# Patient Record
Sex: Female | Born: 1940 | Race: White | Hispanic: No | Smoking: Former smoker
Health system: Southern US, Community
[De-identification: ages and names within clinical notes are randomized; demographics above are authoritative.]

## PROBLEM LIST (undated history)

## (undated) DIAGNOSIS — G47411 Narcolepsy with cataplexy: Secondary | ICD-10-CM

## (undated) DIAGNOSIS — I050 Rheumatic mitral stenosis: Secondary | ICD-10-CM

## (undated) DIAGNOSIS — I10 Essential (primary) hypertension: Secondary | ICD-10-CM

## (undated) DIAGNOSIS — I5030 Unspecified diastolic (congestive) heart failure: Secondary | ICD-10-CM

## (undated) DIAGNOSIS — K449 Diaphragmatic hernia without obstruction or gangrene: Secondary | ICD-10-CM

## (undated) DIAGNOSIS — N289 Disorder of kidney and ureter, unspecified: Secondary | ICD-10-CM

## (undated) DIAGNOSIS — Z86718 Personal history of other venous thrombosis and embolism: Secondary | ICD-10-CM

## (undated) DIAGNOSIS — K219 Gastro-esophageal reflux disease without esophagitis: Secondary | ICD-10-CM

## (undated) DIAGNOSIS — E119 Type 2 diabetes mellitus without complications: Secondary | ICD-10-CM

## (undated) DIAGNOSIS — G629 Polyneuropathy, unspecified: Secondary | ICD-10-CM

## (undated) DIAGNOSIS — I350 Nonrheumatic aortic (valve) stenosis: Secondary | ICD-10-CM

## (undated) HISTORY — DX: Narcolepsy with cataplexy: G47.411

## (undated) HISTORY — DX: Gastro-esophageal reflux disease without esophagitis: K21.9

## (undated) HISTORY — PX: CHOLECYSTECTOMY: SHX55

## (undated) HISTORY — PX: CARPAL TUNNEL RELEASE: SHX101

## (undated) HISTORY — PX: HEMORROIDECTOMY: SUR656

## (undated) HISTORY — DX: Diaphragmatic hernia without obstruction or gangrene: K44.9

## (undated) HISTORY — PX: ABDOMINAL HYSTERECTOMY: SHX81

## (undated) HISTORY — PX: TOTAL HIP ARTHROPLASTY: SHX124

## (undated) HISTORY — PX: KIDNEY SURGERY: SHX687

## (undated) HISTORY — PX: NEPHROSTOMY: SHX1014

---

## 1999-03-08 ENCOUNTER — Encounter: Payer: Self-pay | Admitting: Orthopedic Surgery

## 1999-03-08 ENCOUNTER — Ambulatory Visit (HOSPITAL_COMMUNITY): Admission: RE | Admit: 1999-03-08 | Discharge: 1999-03-08 | Payer: Self-pay | Admitting: Orthopedic Surgery

## 1999-03-23 ENCOUNTER — Ambulatory Visit (HOSPITAL_COMMUNITY): Admission: RE | Admit: 1999-03-23 | Discharge: 1999-03-23 | Payer: Self-pay | Admitting: Orthopedic Surgery

## 1999-03-23 ENCOUNTER — Encounter: Payer: Self-pay | Admitting: Orthopedic Surgery

## 1999-04-06 ENCOUNTER — Ambulatory Visit (HOSPITAL_COMMUNITY): Admission: RE | Admit: 1999-04-06 | Discharge: 1999-04-06 | Payer: Self-pay | Admitting: Orthopedic Surgery

## 1999-04-06 ENCOUNTER — Encounter: Payer: Self-pay | Admitting: Orthopedic Surgery

## 2000-05-07 ENCOUNTER — Encounter: Payer: Self-pay | Admitting: Orthopedic Surgery

## 2000-05-12 ENCOUNTER — Inpatient Hospital Stay (HOSPITAL_COMMUNITY): Admission: RE | Admit: 2000-05-12 | Discharge: 2000-05-16 | Payer: Self-pay | Admitting: Orthopedic Surgery

## 2000-05-12 ENCOUNTER — Encounter: Payer: Self-pay | Admitting: Orthopedic Surgery

## 2000-05-16 ENCOUNTER — Inpatient Hospital Stay (HOSPITAL_COMMUNITY)
Admission: RE | Admit: 2000-05-16 | Discharge: 2000-05-24 | Payer: Self-pay | Admitting: Physical Medicine & Rehabilitation

## 2000-06-20 ENCOUNTER — Ambulatory Visit (HOSPITAL_COMMUNITY): Admission: RE | Admit: 2000-06-20 | Discharge: 2000-06-20 | Payer: Self-pay | Admitting: Internal Medicine

## 2000-10-22 ENCOUNTER — Other Ambulatory Visit: Admission: RE | Admit: 2000-10-22 | Discharge: 2000-10-22 | Payer: Self-pay | Admitting: Otolaryngology

## 2000-10-24 ENCOUNTER — Ambulatory Visit (HOSPITAL_COMMUNITY): Admission: RE | Admit: 2000-10-24 | Discharge: 2000-10-24 | Payer: Self-pay | Admitting: Internal Medicine

## 2000-10-24 ENCOUNTER — Encounter: Payer: Self-pay | Admitting: Internal Medicine

## 2000-11-14 ENCOUNTER — Ambulatory Visit (HOSPITAL_COMMUNITY): Admission: RE | Admit: 2000-11-14 | Discharge: 2000-11-14 | Payer: Self-pay | Admitting: Internal Medicine

## 2002-11-02 ENCOUNTER — Encounter: Payer: Self-pay | Admitting: Internal Medicine

## 2002-11-02 ENCOUNTER — Ambulatory Visit (HOSPITAL_COMMUNITY): Admission: RE | Admit: 2002-11-02 | Discharge: 2002-11-02 | Payer: Self-pay | Admitting: Internal Medicine

## 2003-01-31 ENCOUNTER — Ambulatory Visit (HOSPITAL_COMMUNITY): Admission: RE | Admit: 2003-01-31 | Discharge: 2003-01-31 | Payer: Self-pay | Admitting: Internal Medicine

## 2003-08-04 ENCOUNTER — Ambulatory Visit (HOSPITAL_COMMUNITY): Admission: RE | Admit: 2003-08-04 | Discharge: 2003-08-04 | Payer: Self-pay | Admitting: Family Medicine

## 2003-09-14 ENCOUNTER — Ambulatory Visit (HOSPITAL_COMMUNITY): Admission: RE | Admit: 2003-09-14 | Discharge: 2003-09-14 | Payer: Self-pay | Admitting: Orthopedic Surgery

## 2003-09-15 ENCOUNTER — Ambulatory Visit (HOSPITAL_BASED_OUTPATIENT_CLINIC_OR_DEPARTMENT_OTHER): Admission: RE | Admit: 2003-09-15 | Discharge: 2003-09-15 | Payer: Self-pay | Admitting: Orthopedic Surgery

## 2003-09-15 ENCOUNTER — Ambulatory Visit (HOSPITAL_COMMUNITY): Admission: RE | Admit: 2003-09-15 | Discharge: 2003-09-15 | Payer: Self-pay | Admitting: Orthopedic Surgery

## 2003-12-23 ENCOUNTER — Ambulatory Visit (HOSPITAL_BASED_OUTPATIENT_CLINIC_OR_DEPARTMENT_OTHER): Admission: RE | Admit: 2003-12-23 | Discharge: 2003-12-23 | Payer: Self-pay | Admitting: Orthopedic Surgery

## 2004-07-25 ENCOUNTER — Ambulatory Visit (HOSPITAL_COMMUNITY): Admission: RE | Admit: 2004-07-25 | Discharge: 2004-07-25 | Payer: Self-pay | Admitting: Family Medicine

## 2004-12-04 ENCOUNTER — Ambulatory Visit: Payer: Self-pay | Admitting: Internal Medicine

## 2005-03-14 ENCOUNTER — Encounter: Admission: RE | Admit: 2005-03-14 | Discharge: 2005-03-14 | Payer: Self-pay | Admitting: Specialist

## 2005-06-07 ENCOUNTER — Ambulatory Visit: Admission: RE | Admit: 2005-06-07 | Discharge: 2005-06-07 | Payer: Self-pay | Admitting: Orthopedic Surgery

## 2005-06-11 ENCOUNTER — Ambulatory Visit: Payer: Self-pay | Admitting: Internal Medicine

## 2005-06-20 ENCOUNTER — Ambulatory Visit (HOSPITAL_COMMUNITY): Admission: RE | Admit: 2005-06-20 | Discharge: 2005-06-20 | Payer: Self-pay | Admitting: Internal Medicine

## 2005-06-20 ENCOUNTER — Ambulatory Visit: Payer: Self-pay | Admitting: Internal Medicine

## 2005-06-20 ENCOUNTER — Encounter (INDEPENDENT_AMBULATORY_CARE_PROVIDER_SITE_OTHER): Payer: Self-pay | Admitting: Specialist

## 2005-06-21 ENCOUNTER — Ambulatory Visit (HOSPITAL_COMMUNITY): Admission: RE | Admit: 2005-06-21 | Discharge: 2005-06-21 | Payer: Self-pay | Admitting: Internal Medicine

## 2005-09-02 ENCOUNTER — Inpatient Hospital Stay (HOSPITAL_COMMUNITY): Admission: RE | Admit: 2005-09-02 | Discharge: 2005-09-08 | Payer: Self-pay | Admitting: Orthopedic Surgery

## 2005-09-03 ENCOUNTER — Ambulatory Visit: Payer: Self-pay | Admitting: Physical Medicine & Rehabilitation

## 2005-12-12 ENCOUNTER — Ambulatory Visit: Payer: Self-pay | Admitting: Internal Medicine

## 2005-12-14 ENCOUNTER — Emergency Department (HOSPITAL_COMMUNITY): Admission: EM | Admit: 2005-12-14 | Discharge: 2005-12-14 | Payer: Self-pay | Admitting: Emergency Medicine

## 2006-05-14 ENCOUNTER — Encounter: Payer: Self-pay | Admitting: Emergency Medicine

## 2006-05-14 ENCOUNTER — Ambulatory Visit: Payer: Self-pay | Admitting: Vascular Surgery

## 2006-05-14 ENCOUNTER — Inpatient Hospital Stay (HOSPITAL_COMMUNITY): Admission: AD | Admit: 2006-05-14 | Discharge: 2006-05-16 | Payer: Self-pay | Admitting: Internal Medicine

## 2006-05-14 ENCOUNTER — Ambulatory Visit: Payer: Self-pay | Admitting: Internal Medicine

## 2007-12-01 ENCOUNTER — Ambulatory Visit (HOSPITAL_COMMUNITY): Admission: RE | Admit: 2007-12-01 | Discharge: 2007-12-01 | Payer: Self-pay | Admitting: Family Medicine

## 2007-12-07 ENCOUNTER — Ambulatory Visit (HOSPITAL_COMMUNITY): Admission: RE | Admit: 2007-12-07 | Discharge: 2007-12-07 | Payer: Self-pay | Admitting: Family Medicine

## 2008-04-06 ENCOUNTER — Ambulatory Visit (HOSPITAL_COMMUNITY): Admission: RE | Admit: 2008-04-06 | Discharge: 2008-04-06 | Payer: Self-pay | Admitting: Family Medicine

## 2008-04-26 ENCOUNTER — Ambulatory Visit (HOSPITAL_COMMUNITY): Admission: RE | Admit: 2008-04-26 | Discharge: 2008-04-26 | Payer: Self-pay | Admitting: Family Medicine

## 2008-05-31 ENCOUNTER — Ambulatory Visit (HOSPITAL_COMMUNITY): Admission: RE | Admit: 2008-05-31 | Discharge: 2008-05-31 | Payer: Self-pay | Admitting: Family Medicine

## 2009-02-06 ENCOUNTER — Ambulatory Visit (HOSPITAL_COMMUNITY): Admission: RE | Admit: 2009-02-06 | Discharge: 2009-02-06 | Payer: Self-pay | Admitting: Family Medicine

## 2009-02-16 ENCOUNTER — Encounter: Admission: RE | Admit: 2009-02-16 | Discharge: 2009-02-16 | Payer: Self-pay | Admitting: Family Medicine

## 2009-02-20 ENCOUNTER — Encounter: Admission: RE | Admit: 2009-02-20 | Discharge: 2009-02-20 | Payer: Self-pay | Admitting: Family Medicine

## 2009-05-22 ENCOUNTER — Ambulatory Visit (HOSPITAL_COMMUNITY): Admission: RE | Admit: 2009-05-22 | Discharge: 2009-05-22 | Payer: Self-pay | Admitting: Otolaryngology

## 2009-09-07 ENCOUNTER — Emergency Department (HOSPITAL_COMMUNITY): Admission: EM | Admit: 2009-09-07 | Discharge: 2009-09-07 | Payer: Self-pay | Admitting: Emergency Medicine

## 2009-09-19 ENCOUNTER — Ambulatory Visit (HOSPITAL_COMMUNITY): Admission: RE | Admit: 2009-09-19 | Discharge: 2009-09-19 | Payer: Self-pay | Admitting: Family Medicine

## 2009-11-13 ENCOUNTER — Ambulatory Visit: Payer: Self-pay | Admitting: Internal Medicine

## 2009-11-17 ENCOUNTER — Ambulatory Visit: Payer: Self-pay | Admitting: Internal Medicine

## 2009-11-17 ENCOUNTER — Ambulatory Visit (HOSPITAL_COMMUNITY): Admission: RE | Admit: 2009-11-17 | Discharge: 2009-11-17 | Payer: Self-pay | Admitting: Internal Medicine

## 2010-01-09 ENCOUNTER — Ambulatory Visit: Payer: Self-pay | Admitting: Internal Medicine

## 2010-01-15 ENCOUNTER — Encounter (HOSPITAL_COMMUNITY)
Admission: RE | Admit: 2010-01-15 | Discharge: 2010-02-14 | Payer: Self-pay | Source: Home / Self Care | Attending: Internal Medicine | Admitting: Internal Medicine

## 2010-02-19 ENCOUNTER — Encounter
Admission: RE | Admit: 2010-02-19 | Discharge: 2010-02-19 | Payer: Self-pay | Source: Home / Self Care | Attending: Obstetrics & Gynecology | Admitting: Obstetrics & Gynecology

## 2010-02-25 ENCOUNTER — Encounter: Payer: Self-pay | Admitting: Family Medicine

## 2010-04-18 LAB — GLUCOSE, CAPILLARY: Glucose-Capillary: 258 mg/dL — ABNORMAL HIGH (ref 70–99)

## 2010-06-22 NOTE — Op Note (Signed)
NAME:  Rebecca [name], LANGSTON                           ACCOUNT NO.:  [id number]   MEDICAL RECORD NO.:  [id number]                   PATIENT TYPE:  AMB   LOCATION:  DSC                                  FACILITY:  [facility]   PHYSICIAN:  Katy Fitch. Naaman Plummer., M.D.          DATE OF BIRTH:  15-Apr-1940   DATE OF PROCEDURE:  09/15/2003  DATE OF DISCHARGE:                                 OPERATIVE REPORT   PREOPERATIVE DIAGNOSIS:  1. 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  2. Painful volar ganglion cyst at bifurcation of radial artery right volar     wrist.   POSTOPERATIVE DIAGNOSIS:  1. 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  2. Painful volar ganglion cyst at bifurcation of radial artery right volar     wrist.   OPERATION:  1. Release of right transverse carpal ligament.  2. Excision of volar capsular ganglion adherent to superficial branch of     radial artery.   SURGEON:  Katy Fitch. Sypher, M.D.   ASSISTANT:  Marveen Reeks Dasnoit, P.A.-C.   ANESTHESIA:  General/sedation/IV regional at the proximal brachial level.   ANESTHESIOLOGIST:  Belva Agee, M.D.   INDICATIONS FOR PROCEDURE:  [name] is a 70 year old woman with multiple  medical problems including chronic diabetes, peripheral neuropathy,  narcolepsy, cataplexy, and painful carpal tunnel syndrome.  She was referred  by her family physician in [place], [place], for evaluation and  management of her hand pain predicament and had had neurologic evaluation at  [facility] confirming the presence of polyneuropathy with superimposed  carpal tunnel syndrome.  Due to a failure to respond to nonoperative  measures, she is brought to the operating room at this time.   PROCEDURE:  [name] is brought to the operating room and placed on  supine position on the operating table.  Following light sedation, an IV  regional block was placed with a thigh cuff at proximal  brachial level due  to morbid obesity.  After approximately ten minutes, we had satisfactory  anesthesia to allow proceeding with surgery.  A short transverse incision  was fashioned directly over the palpable mass at the bifurcation of the  radial artery.  The subcutaneous tissues were carefully divided revealing a  cystic mass adherent to the superficial branch of the radial artery  involving one of the veins passing with the artery.  This was  circumferentially dissected and followed down to the level of the volar  wrist capsule.  A portion of the vein accompanying the superficial branch of  the radial artery was sacrificed with the mass with 4-0 Vicryl suture being  used for hemostasis of the proximal and distal stumps of the vessel.  The  cyst was excised directly from the capsule and electrocautery used to seal  the margins of the ganglion neck.  The wound was irrigated and repaired with  intradermal 3-0 Prolene suture.  Attention was directed to the palm.  A short incision was fashioned in the  line of the ring finger.  The subcutaneous tissues were carefully divided  revealing the palmar fascia.  This was split longitudinally revealing the  common sensory branches of the median nerve.  These were followed back to  the transverse carpal ligament which was carefully isolated from the median  nerve.  The ligament was released along the ulnar border extending into the  distal forearm.  This widely opened the carpal canal.  No masses or other  predicaments were noted.  Bleeding points along the margins of the released  ligament were electrocauterized with bipolar current followed by repair of  the skin with intradermal 3-0 Prolene suture.  A compressive dressing was  applied with a volar plaster splint with the wrist in 5 degrees  dorsiflexion.                                               Katy Fitch Naaman Plummer., M.D.    RVS/MEDQ  D:  09/15/2003  T:  09/15/2003  Job:  [id number]

## 2010-06-22 NOTE — H&P (Signed)
NAMEOLIVIAH, AGOSTINI                 ACCOUNT NO.:  [id number]   MEDICAL RECORD NO.:  [id number]          PATIENT TYPE:  INP   LOCATION:  NA                           FACILITY:  [facility]   PHYSICIAN:  Ollen Gross, M.D.    DATE OF BIRTH:  [birth year]   DATE OF ADMISSION:  06/10/2005  DATE OF DISCHARGE:                                HISTORY & PHYSICAL   DATE OF OFFICE VISIT AND HISTORY AND PHYSICAL:  May 21, 2005.   CHIEF COMPLAINT:  Left hip pain.   HISTORY OF PRESENT ILLNESS:  The patient is a 70 year old female who has  been seen and evaluated by Dr. Lequita Halt for ongoing left hip pain.  She was  initially seen by Dr. Valma Cava and then followed up with Dr. Lequita Halt.  She was found to have hip pain that has been ongoing for quite some time  now.  She has been seen by Dr. Lequita Halt and found to have arthritis which has  been quite debilitating and progressive in nature.  It is felt she has  reached a point where she could benefit from undergoing hip replacement.  Risks and benefits of the procedure have been discussed with the patient at  length and she elected to proceed with surgery.   ALLERGIES:  Multiple allergies to include:   1.  CODEINE.  2.  COMPAZINE.  3.  DEMEROL.  4.  METHYLATE MERCURY COMPOUNDS.  5.  MORPHINE.  6.  NSAIDs.  7.  PENICILLIN.  8.  THIMEROSAL.  9.  ADHESIVE TAPE.  10. She states she also reacts with PAPER TAPE.  11. Documented history to BLACK SUTURE.   THE PATIENT IS ABLE TO USE OPSITE FOR DRESSING.   CURRENT MEDICATIONS:  Glucophage, Avandia, Cymbalta, Remeron, Ambien,  Prilosec, Cardizem, Ultracet, Lotensin, and hydrochlorothiazide.   PAST MEDICAL HISTORY:  1.  Narcolepsy.  2.  Cataplexy.  3.  Gastroesophageal reflux disease.  4.  Hypertension.  5.  History of esophageal polyps.  6.  History of prolapse colon.  7.  Varicose veins.  8.  Migraines.  9.  Anemia.  10. History of hypokalemia.  11. History of constipation.  12. History of  postoperative nausea.  13. Spinal stenosis.   PAST SURGICAL HISTORY:  1.  Right total hip replacement.  2.  Cholecystectomy.  3.  Hemorrhoidectomy.  4.  Tubal ligation.  5.  Lumpectomy.  6.  Hysterectomy.  7.  Multiple kidney and bladder surgeries including nephrostomy.   SOCIAL HISTORY:  Married.  She provides full care for her husband who is a  quadriplegic.  Denies using tobacco products or alcohol products.  Has two  children.   FAMILY HISTORY:  Mother deceased age 2 with a history of stroke, COPD,  heart failure, heart attack, and hypertension.  Father deceased age 72 with  a history of heart attack, stroke, and Alzheimer's.  She has a brother  living, age 56, with a history of heart attack and back surgery, and also  polio and scleroderma.   REVIEW OF SYSTEMS:  GENERAL:  No fever, chills, night sweats.  NEURO:  She  does have a history of narcolepsy and cataplexy.  No seizures or syncope.  RESPIRATORY:  No shortness of breath, productive cough, or hemoptysis.  CARDIOVASCULAR:  No chest pain, angina, orthopnea.  GI:  No nausea,  vomiting, diarrhea, or constipation, although she does have a history of  esophageal polyps and a prolapsed colon, and a history of constipation.  Denies any complaints at this time.  GU:  No dysuria, hematuria, discharge.  MUSCULOSKELETAL:  Left hip.   PHYSICAL EXAMINATION:  VITAL SIGNS:  Pulse 68, respirations 16, blood  pressure 130/74.  GENERAL:  A 70 year old white female, well nourished, well developed.  She  is accompanied by her daughter.  Alert, oriented, cooperative.  Some mild  distress secondary to her hip pain, overweight.  The patient is quite  anxious at time of exam.  HEENT:  Normocephalic, atraumatic.  Pupils round and reactive.  Oropharynx  clear.  EOMs intact.  Full upper and lower denture plates noted.  NECK:  Supple.  CHEST:  Clear anterior posterior chest walls.  No rhonchi, rales, or  wheezing.  HEART:  Irregular rhythm.   S1 S2 noted with ectopic beats.  ABDOMEN:  Soft, nontender, round.  Bowel sounds present.  RECTAL/BREASTS/GENITALIA:  Not done.  Not pertinent to present illness.  EXTREMITIES:  The left hip moderate pain with motion.  Flexion up to 90  degrees.  Minimal internal rotation, 20 degrees of external rotation, 20  degrees of abduction.   IMPRESSION:  1.  Osteoarthritis, left hip.  2.  Narcolepsy.  3.  Cataplexy.  4.  Gastroesophageal reflux disease.  5.  History of esophageal polyps.  6.  Prolapsed colon.  7.  History of migraines.  8.  History of anemia.  9.  History of hypokalemia.  10. History of constipation.  11. History of postoperative nausea.  12. History of varicose veins.  13. Spinal stenosis.   PLAN:  The patient will be admitted to [facility] to undergo a  left total hip arthroplasty.  The surgery will be performed by Dr. Ollen Gross.      Alexzandrew L. Julien Girt, P.A.      Ollen Gross, M.D.  Electronically Signed    ALP/MEDQ  D:  06/09/2005  T:  06/09/2005  Job:  [id number]   cc:   Melvyn Novas, MD  Fax: [phone]   Hanley Hays. Josefine Class, M.D.  Fax: [phone]

## 2010-06-22 NOTE — H&P (Signed)
[facility]  Patient:    Rebecca [name], Rebecca [name]                          MRN: [id number] Adm. Date:  05/09/00 Attending:  Ollen Gross, M.D. Dictator:   Dorie Rank, P.A.C. CC:         Dr. Josefine Class in [place]  Dr. Daleen Squibb in [place]   History and Physical  DATE OF BIRTH:  1940-05-09  CHIEF COMPLAINT:  Right hip pain.  HISTORY OF PRESENT ILLNESS:  Rebecca [name] is a 70 year old female who has had significant hip pain over the past 2 to 3 years.  It is to a point that it is hurting her all the time now.  It is in the groin and the buttock.  It radiates down to the mid thigh medially.  She is at a stage now where she can barely walk.  She is having difficulty sleeping tonight.  She has had a fairly active day.  On physical exam, she has flexion of the hip to 70 degrees, and above that she develops significant pain.  She has no internal or external rotation.  She only has 20 degrees of abduction.  She has a markedly antalgic gait with a positive Trendelenburg.  Radiographs of the right hip reveal bone-on-bone changes with the femoral head starting to rot a little bit.  Due to her significant pain, as well as interference of her activities of daily living, it was felt that she would benefit from undergoing a right total hip arthroplasty.  The procedure, as well as the risks and benefits were discussed with the patient, and she was in agreement to proceed with the procedure.  She obtained a cardiac clearance from Dr. Daleen Squibb from [facility], and she was cleared for surgery.  PAST MEDICAL HISTORY: 1. Hypertension. 2. History of narcolepsy and cataplexy. 3. History of prolapsed colon. 4. She has varicose veins. 5. Spinal stenosis. 6. Migraine headaches. 7. Osteoarthritis to the right hip as described above.  PAST SURGICAL HISTORY: 1. Cholecystectomy 7 to 8 years ago. 2. Hysterectomy in 1984. 3. Hemorrhoidectomy 25 years ago. 4. Tubal ligation,  age 54. 5. Nonmalignant lumpectomy, age 52 to 35.  MEDICATIONS:  1. Nexium 40 mg one p.o. q.d.  2. Hydrochlorothiazide 50 mg one p.o. q.a.m.  3. Lotensin 10 mg one p.o. q.d.  4. Ambien 10 mg one p.o. q.h.s.  5. Potassium 10 mEq one p.o. q.d.  6. ______ 37.5 mg q.a.m.  7. ______ 25 mg one p.o. q.d.  8. Premarin 1.25 mg one p.o. q.d.  9. Librax 5 mg one p.o. q.i.d. 10. Cardizem 360 mg one p.o. q.a.m. 11. Phenergan 25 mg one p.o. p.r.n. nausea. 12. Vicodin one or two p.o. q.4-6h. p.r.n. pain.  ALLERGIES:  DEMEROL, skin rash, REGLAN, nausea, INNOZAR, anaphylaxis, COMPAZINE, PENICILLIN, anaphylaxis.  She also states that anything with THERMASOL PRESERVATIVE she is allergic to, sometimes used in anesthetic, eye drops, and ear drops.  TAPE ADHESIVE.  She states she is allergic to ELASTIC. She states she is allergic to BLACK SUTURE.  She states once she had a cholecystectomy and she had some boils develop as a result of an allergic reaction to the suture, and these had to be I&D.  SOCIAL HISTORY:  The patient is married, she takes care of a quadriplegic husband at home.  She has two grown children.  She hopes to go to rehab after surgery.  She denies any  alcohol or tobacco use.  FAMILY HISTORY:  Mother living age 23, history of strokes, cerebrovascular accidents, myocardial infarction, hypertension, and stomach problems.  Father deceased at age 11, history of myocardial infarction, cerebrovascular accident, Alzheimers.  She does have a brother who has had an myocardial infarction.  REVIEW OF SYSTEMS:  GENERAL:  No fever, chills, night sweats, or bleeding tendencies.  PULMONARY:  No shortness of breath, productive cough, or hemoptysis.  CNS:  No blurred or double vision, headaches, or paralysis.  CARDIOVASCULAR:  No chest pain, angina, or orthopnea.  GASTROINTESTINAL:  Occasional pyrosis, has a history of gastroesophageal reflux disease.  GASTROURINARY:  No dysuria, hematuria,  or discharge.  MUSCULOSKELETAL:  Right hip pain as described above.  PHYSICAL EXAMINATION:  GENERAL:  A 70 year old white female, obese, who appears somewhat nervous and apprehensive on exam.  VITAL SIGNS:  Pulse 86, respirations 20, blood pressure 142/74.  HEENT:  Head is normocephalic, atraumatic.  Oropharynx is clear.  NECK:  Supple, negative for carotid bruits bilaterally.  CHEST:  Lungs are clear to auscultation, no wheezes, rhonchi, or rales.  BREASTS:  Not pertinent to present illness.  HEART:  S1 and S2 negative for murmur, rub, or gallop.  Heart is regular rate and rhythm.  ABDOMEN:  Soft, nontender, positive bowel sounds in all four quadrants. Abdomen is round.  GENITOURINARY:  Not pertinent to present illness.  EXTREMITIES:  Dorsalis pedis pulses are 2+ bilaterally.  Posterior tibialis pulses are 2+ bilaterally.  See history of present illness for physical examination to the right hip.  SKIN:  Acyanotic.  No rashes or lesions noted.  PREOPERATIVE LABORATORIES AND X-RAYS:  Pending at this time.  IMPRESSION: 1. Right hip osteoarthritis. 2. Hypertension.  The patient was medically cleared by Dr. Daleen Squibb of [facility] in [place]. 3. Narcolepsy/cataplexy.  PLAN:  The patient is scheduled for a right total hip arthroplasty with Dr. Ollen Gross on 05/12/00. DD:  05/09/00 TD:  05/09/00 Job: [id number]

## 2010-06-22 NOTE — Op Note (Signed)
NAME:  Rebecca [name], Rebecca [name]                           ACCOUNT NO.:  [id number]   MEDICAL RECORD NO.:  [id number]                   PATIENT TYPE:  AMB   LOCATION:  DAY                                  FACILITY:  [facility]   PHYSICIAN:  Lionel December, M.D.                 DATE OF BIRTH:  31-Mar-1940   DATE OF PROCEDURE:  01/31/2003  DATE OF DISCHARGE:                                 OPERATIVE REPORT   PROCEDURE:  Esophagogastroduodenoscopy with gastric polypectomy.   ENDOSCOPIST:  Lionel December, M.D.   INDICATIONS:  This patient is a 70 year old Caucasian female who was seen in  the office last week for flare up of her chronic GERD.  She also is  complaining of fatigue and weakness.  We, therefore, did lab studies and her  hemoglobin was 8.1 with MC being very low.  She, therefore, is felt to have  iron deficiency anemia.  Her anemia profile is pending.  She, however,  denied melena or rectal bleeding.  She is undergoing EGD to further evaluate  her iron-deficiency anemia.  She will undergo colonoscopy at a later date.   The procedure and risks were reviewed with the patient and informed consent  was obtained.   PREOPERATIVE MEDICATIONS:  Cetacaine spray for oropharyngeal topical  anesthesia, Fentanyl 50 mcg IV and Versed 10 mg IV in divided dose.   FINDINGS:  Procedure performed in endoscopy suite.  The patient's vital  signs and O2 saturation were monitored during the procedure and remained  stable.  The patient was placed in the left lateral recumbent position and  an Olympus videoscope was passed via the oropharynx without any difficulty  into the esophagus.   ESOPHAGUS:  Mucosa of the esophagus was normal; however, the squamocolumnar  junction was wavy.  Pictures taken for the record.  GE junction was 35 and  diaphragmatic hiatus was at 40 cm.  She was, therefore, felt to have small  sliding hiatal hernia.   STOMACH:  It was empty and distended very well with insufflation.  The folds  of the proximal stomach were normal.  Examination of the mucosa revealed  patchy erythema in the proximal stomach along with finger-like polyp at  gastric body which was ulcerated.  It had clear-cut demarcation between the  normal mucosa and the polyp.  It had a short stalk.  This was snared and  retrieved for histologic examination.  The rest of the stomach was normal.  The pyloric channel was patent.   DUODENUM:  Examination of the bulb and second part of the duodenum was  normal.   The endoscope was withdrawn.  The patient tolerated the procedure well.   FINAL DIAGNOSES:  1. Small sliding hiatal hernia with wavy ulcerated GE junction, but no     evidence of erosions or ulceration.  2. Long tubular, ulcerated gastric polyp at body which was snared and  retrieved for histologic examination.  This certainly could be one     potential source of her GI blood loss and anemia.  3. Proximal gastritis.   RECOMMENDATIONS:  1. She will continue antireflux measures and her usual medications.  2. H&H will be checked today along with H. pylori serology.      ___________________________________________                                            Lionel December, M.D.   NR/MEDQ  D:  01/31/2003  T:  01/31/2003  Job:  [id number]   cc:   Hanley Hays. [place], M.D.  [address]  [place]  [place] [address]  Fax: [phone]

## 2010-06-22 NOTE — Discharge Summary (Signed)
Rebecca [name], Rebecca [name]                 ACCOUNT NO.:  [id number]   MEDICAL RECORD NO.:  [id number]          PATIENT TYPE:  INP   LOCATION:  1514                         FACILITY:  [facility]   PHYSICIAN:  Ollen Gross, M.D.    DATE OF BIRTH:  07-09-1940   DATE OF ADMISSION:  09/02/2005  DATE OF DISCHARGE:                                 DISCHARGE SUMMARY   DATE OF DISCHARGE:  She may end up going to a skilled facility, possibly  today.   ADMISSION DIAGNOSES:  1. Osteoarthritis of the left hip.  2. Narcolepsy.  3. Cataplexy.  4. Gastroesophageal reflux disease.  5. Hypertension.  6. Esophageal spasms.  7. Recently-diagnosed esophageal varices.  8. History of prolapsed colon.  9. Varicose veins.  10.History of migraines.  11.History of anemia.  12.History of hypokalemia.  13.History of constipation.  14.History of postoperative nausea.  15.Spinal stenosis.   DISCHARGE DIAGNOSES:  1. Osteoarthritis of the left hip, status post left total hip      arthroplasty.  2. Postoperative hyponatremia, improved.  3. Osteoarthritis of the left hip.  4. Narcolepsy.  5. Cataplexy.  6. Gastroesophageal reflux disease.  7. Hypertension.  8. Esophageal spasms.  9. Recently-diagnosed esophageal varices.  10.History of prolapsed colon.  11.Varicose veins.  12.History of migraines.  13.History of anemia.  14.History of hypokalemia.  15.History of constipation.  16.History of postoperative nausea.  17.Spinal stenosis.   PROCEDURE:  September 02, 2005:  Left total hip.  Surgeon:  Dr. Lequita Halt.  Assistant:  Avel Peace, PAC.  Anesthesia:  General.   CONSULTATIONS:  Rehab Services.   BRIEF HISTORY:  Ms. Rebecca [name] is a 70 year old female with endstage arthritis  of the left hip with intractable pain, previously successful right total  hip, but now presents for a left total hip arthroplasty.   LABORATORY DATA:  CBC on admission:  Hemoglobin 13.9, hematocrit of 41.7,  white cell count 7.  PT/PTT on  admission:  13.3 and 30, respectively.  An  INR of 1.  Chem panel on admission:  All within normal limits.  Preoperative  urinalysis:  Small leukocyte esterase, 0-2 white cells, rare bacteria,  otherwise negative.  Serial CBCs were followed throughout the hospital  course.  Hemoglobin drifted down to 11.6.  Last noted H&H was 10.6 and 31.4.  Serial protimes followed.  Last noted PT/INR documented:  23.3 and 2.  Serial B-mets were followed throughout the hospital course.  Sodium did drop  down to 129, but came back up to 137.  The remaining electrolytes remained  within normal limits.   X-RAYS:  Two-view chest, taken Jun 10, 2005:  Low lung volumes, without acute  cardiopulmonary process.  Two-view hip:  Osteoarthritic changes of the left  hip of July 2007.  Portable films from September 03, 1999, along with portable  femur:  Satisfactory position of the left hip.  Follow-up pelvis, September 04, 2005:  No change from September 02, 2005.  __________  prosthesis not imagined.   HOSPITAL COURSE:  The patient was admitted to [facility] and  tolerated the procedure  well under general anesthesia.  Tolerated procedure  well.  Later went to the recovery room and then to the Orthopedic floor.  She was started on p.c. and p.o. analgesics for pain control following  surgery.  She had gone to rehab with her previous hip.  A rehab consult was  called.  The patient was seen and evaluated.  She had quite severe pain the  night of surgery, and also on day one, to the point where she was unable to  get up out of bed that first day, using p.c. and p.o. analgesics.  A Hemovac  drain that was placed at the time of surgery was pulled.  The patient was  seen by rehab and felt that if she did well and showed progression that she  may benefit from an inpatient stay.  By day two, her left leg and knee were  still sore.  She was hurting still a bit.  Hemoglobin was 11.  She had a  little bit of hyponatremia,  therefore, fluids were discontinued.  We are  hopeful for rehab.  Physical therapy started working with her.  Attempted  out of bed the first day, but was unable.  On day two, she was max total  assist and took quite some time to get her up.  She was unable to ambulate  at that time, due to the difficulty in requiring max assist.  It was felt  that she probably would not be a good candidate for acute short-stay rehab,  and they recommended probably a skilled facility.  She continued to have  much difficulty the first several days in the hospital and even difficulty  with the bedpan the first two to three days after the Foley came out.  On  day three, she had again a very tough day.  On the evening of day two and  the morning of day three.  A long discussion ensued with Dr. Lequita Halt,  discussing her slow progression and possible need for a skilled facility.  The patient did not want to go to a skilled facility if she did not have to.  We did encourage her that if she wanted to try and go home, then she must  start progressing well with physical therapy.  Rehab said she was not a  candidate.  Therefore, the only other option would be a skilled facility  versus home, but only home after she improved.  On the following day of  September 06, 2005, postop day four, she is actually already sitting up in bed.  She did get up and ambulate short distances on postop day three and day  four, she was getting up in the hallway a little bit and ambulating  approximately 40 feet with therapy.  Discharge planning had been working  with her after rehab said she was not a candidate and said there was a bed  available at one facility.  However, she was wanting to look into a  different facility, which was very close to home.  Rehab was going to look  into this facility and see if it was an option later that afternoon of  September 06, 2005, postop day four.  If a bed became available, then we would allow her to go home  later this afternoon.  Otherwise, if she refused  placement, we would continue to try to work with her on physical therapy and  possibly get her home if she got to a level where she was  safe for home  health.   DISCHARGE PLAN:  Inpatient skilled rehab facility versus home, depending on  progress.  No decision made at this time.   DISCHARGE MEDICATIONS:  Current medications include Coumadin.  She needs to  be on Coumadin for a total of three weeks from the date of surgery, which  was September 02, 2005.  Titrate the Coumadin level for a target INR level  between 2 and 3.  She is currently 2.3 at the time of discharge.  Ferrous  sulfate p.o. t.i.d. with meals.  Diltiazem-ER 180 mg daily.  Avandamet 4/500  daily, Cymbalta 30 mg daily, Glucotrol-XL 10 mg daily, potassium chloride 10  mEq q.i.d., Protonix 40 mg twice a day, Remeron 15 mg p.o. q.h.s., Ambien 10  mg p.o. q.h.s., Lotensin 20 mg daily, hydrochlorothiazide 12.5 mg daily,  Anafranil 25 mg p.o. q.i.d., Senokot-S p.o. b.i.d., laxative of choice/enema  of choice, Percocet one or two every 4-6 hours as needed for pain, Tylenol  one or two every 4-6 hours as needed for mild pain, temp, or headache,  Robaxin 500 mg p.o. q.6 hours p.r.n. spasms, Zofran 4 mg p.o. q.6 hours  p.r.n. nausea, clobetasol propionate 0.05% cream as needed.   DIET:  Continue current diabetic diet.   FOLLOWUP:  The patient needs to follow up in the office, approximately 2  weeks after discharge, on Thursday or Friday, August 16th or 17th.  Please  contact the office at [phone] to arrange an appointment time for the  patient.   ACTIVITY:  She is partial weight bearing 25-50% to the left lower extremity,  gait training, ambulation.  ADLs as per PT and OT.  Total hip protocol.  Hip  precautions at all times.  Daily dressing changes.  May start showering as  of today.  Do not submerge incision under water.   DISPOSITION:  Pending.   CONDITION ON DISCHARGE:  Slowly  improving.      Alexzandrew L. Julien Girt, P.A.      Ollen Gross, M.D.  Electronically Signed    ALP/MEDQ  D:  09/06/2005  T:  09/06/2005  Job:  [id number]   cc:   Ollen Gross, M.D.  Fax: [phone]   Lionel December, M.D.  [address]  [place]  [place] [address]   Hanley Hays Josefine Class, M.D.  Fax: [phone]   Delbert Harness, MD

## 2010-06-22 NOTE — Discharge Summary (Signed)
[facility]  Patient:    Rebecca [name], Rebecca [name]                   MRN: [id number] Adm. Date:  [id number] Disc. Date: 05/16/00 Attending:  Loanne Drilling Dictator:   Della Goo, P.A.-C.                           Discharge Summary  ADMISSION DIAGNOSES: 1. Right hip end-stage osteoarthritis. 2. Hypertension. 3. Narcolepsy/cataplexy. 4. History of prolapsed colon. 5. Migraine headaches.  DISCHARGE DIAGNOSES: 1. Right hip end-stage osteoarthritis. 2. Hypertension. 3. Narcolepsy/cataplexy. 4. History of prolapsed colon. 5. Migraine headaches. 6. Post hemorrhagic anemia, not requiring a blood transfusion. 7. Hypokalemia, treated with potassium supplementation, resolved. 8. Constipation. 9. Postoperative nausea, resolved at discharge.  PROCEDURE:  On May 12, 2000, the patient underwent right total hip arthroplasty performed by Dr. Lequita Halt, assisted by Ralene Bathe, P.A.-C. under spinal anesthesia.  CONSULTATION:  Dr. Riley Kill of physical medicine and rehabilitation.  BRIEF HISTORY:  The patient is a 70 year old white female with severe end-stage osteoarthritis of the right hip.  The pain has been refractory to nonoperative management.  It was felt she would require surgical intervention and was admitted for the procedure as stated above.  BRIEF HOSPITAL COURSE:  The patient tolerated the procedure under spinal anesthetic without difficulties.  Postoperatively, she was placed on Coumadin for DVT and PE prophylaxis.  She continued on the Coumadin protocol while in the hospital.  Postoperatively, the patient complained of reflux symptoms and had nausea postoperatively.  The nausea was treated with several agents, and eventually she received relief with the use of Zofran.  The patient had difficulty with pain control postoperatively, especially for the first two postoperative days. She eventually was able to get relief with Dilaudid via PCA pump,  and then was gradually weaned on to oral analgesics utilizing mostly Percocet.  The patient had significant itching from her tape at the IV site, as well as the wound dressing.  On the first postoperative day, the tape was removed and the incision was noted to be healing well.  Her Hemovac drain was pulled on the first postoperative day without difficulties as well.  She continued to have no dressing for the wound throughout the hospital stay, and fortunately, there was no drainage or erythema noted as well.  She had minimal edema of the hip and ice packs were utilized.  Xanax was ordered for anxiety, and even though the patient was somewhat anxious and agitated throughout the hospital stay, she did not use this medication very often.  The patient was started on physical therapy for ambulation and gait training. She was only allowed touch down weightbearing on the operative extremity.  She required maximum assistance for transfers, and was able to ambulate as much as five feet during the hospital stay.  She was instructed in total hip replacement precautions, and was able to manage these during the hospital stay.  Her IV fluids were discontinued and she was able to take POs without difficulty.  Foley catheter was discontinued and a urinalysis was obtained and was negative for urinary tract infection.  The patient had some low grade fevers during the hospital stay which was felt to be probably related to atelectasis as she had no other signs or symptoms of infectious process, and she was treated with incentive spirometry.  At the time of her discharge to rehab, she was afebrile and  vital signs were stable. The patient was having difficulty with constipation, and laxatives and enemas were started prior to her transfer to rehab.  OT assisted with ADLs, and the patient tolerated this well.  A bed became available in rehab on May 16, 2000, and she was felt stable for transfer for  continuation of her care.  PERTINENT LABORATORY VALUES:  Admission CBC with hemoglobin of 14.4, hematocrit 41.6, remaining values normal.  Hemoglobin dropped to a lowest value of 11.5.  She did not require blood transfusion.  Coagulation studies on admission were normal and she did have elevations of pro time and INR while on Coumadin during the hospital stay.  Chemistry studies on admission with CO2 33, glucose 153, AST 65, ALT 69, and remaining values normal.  Repeat chemistry on May 13, 2000 with potassium 3.3, glucose 173, calcium 8.0. Repeat BMET on May 14, 2000 with sodium 134, potassium 3.6, glucose 168, calcium 8.2, and remaining values normal.  Urinalysis on admission was negative for urinary tract infection and repeat urinalysis after the Foley catheter was discontinued was also negative for urinary tract infection.  Postoperative hip x-ray showed satisfactory alignment of the components of the right hip replacement.  Preoperative chest x-ray was negative for active disease.  EKG on admission was utilized from one taken previously in her family physicians office and was noted to be normal.  PLAN:  The patient will be transferred to [facility].  She will continue with ambulation and gait training, as well as range of motion and strengthening exercises.  She is allowed touch down weightbearing only on the operative extremity.  The patient cannot utilized tape, and therefore, the wound will be left open to air at all times with Steri-Strips left to peel spontaneously. The patient should continue with a regular diet.  She is having some difficulty with constipation, and since she does have a prolapsed colon, she is very concerned about this.  We are utilizing the usual protocol of suppositories, laxatives, and enemas for her constipation.  Occupational therapy will assist with ADLs.  MEDICATIONS AT DISCHARGE:  Include Cylert 37.5 mg once daily which was just started today,  Trinsicon one p.o. t.i.d., Protonix 80 mg p.o. q.d., hydrochlorothiazide 50 mg q.d., Lotensin 10 mg b.i.d., Librax one p.o. q.i.d.,  Anafranil 25 mg one p.o. q.i.d., Cardizem CD 180 mg p.o. q.d., Xanax 0.5 mg one every four to six hours as needed for anxiety, K-Dur 20 mEq b.i.d., Coumadin per pharmacy, laxative of choice, enema of choice, Colace p.o. b.i.d. which has been changed to Peri-Colace today.  P.R.N. MEDICATIONS:  Include Percocet, Phenergan, Robaxin, and Tylenol, as well as Ambien for sleep.  Adjustments in all of these medications will be made by the staff at rehab.  FOLLOWUP:  The patient should follow up with Dr. Lequita Halt following her stay at rehab.  If there are further questions or concerns during her rehab stay, please notify his office.  The patients O2 has been discontinued and she has been maintained at 91% oxygen saturation on room air.  CONDITION ON DISCHARGE:  Stable. DD:  05/16/00 TD:  05/16/00 Job: [id number]

## 2010-06-22 NOTE — Discharge Summary (Signed)
Rebecca [name], Rebecca [name]                 ACCOUNT NO.:  [id number]   MEDICAL RECORD NO.:  [id number]          PATIENT TYPE:  INP   LOCATION:  6734                         FACILITY:  [facility]   PHYSICIAN:  C. Ulyess Mort, M.D.DATE OF BIRTH:  03-15-1940   DATE OF ADMISSION:  05/09/2006  DATE OF DISCHARGE:  05/16/2006                               DISCHARGE SUMMARY   DISCHARGE DIAGNOSES:  1. 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  2. Non-insulin-dependent diabetes mellitus type 2.  3. Cataplexy.  4. Narcolepsy.  5. Hypertension.  6. Esophageal varices.  7. Gastroesophageal reflux disease.  8. History of tubular adenoma of the sigmoid colon in 2002.  9. History of occult esophageal bleeding.  10.Status post hip replacement in 2002.  11.Status post left hip arthroplasty in July 2008.  12.Status post cholecystectomy.  13.Status post hysterectomy.  14.Status post hemorrhoidectomy.   DISCHARGE MEDICATIONS:  1. Coumadin 7.5 mg p.o. daily.  2. Lovenox 115 mg subcu b.i.d.  3. Clomipramine 25 mg by mouth at 5 a.m., 12 p.m., 4 p.m., and 10:30      p.m.  4. Glucotrol XL 10 mg p.o. daily.  5. Cardizem 360 mg p.o. daily.  6. Iron sulfate 325 mg p.o. b.i.d.  7. Mirtazapine 15 mg by mouth q.h.s.  8. Omeprazole 20 mg p.o. b.i.d.  9. Primidone 50 mg p.o. b.i.d.  10.Ultram ER 100 mg p.o. q.h.s.  11.Lotensin HCT 10/12.5 p.o. daily.  12.K-Dur 10 mEq p.o. 4 times a day.  13.Cymbalta 30 mg p.o. daily.  14.Ambien 10 mg p.o. q.h.s.  15.Senokot 2 tablets p.o. q.h.s. p.r.n.  16.Os-cal Plus D 400 mg p.o. daily.   DISPOSITION:  The patient is to go home with followup with Dr. Delbert Harness  on May 20, 2006, when she needs to get a PT/INR to follow up on her  Coumadin and decide if she needs to stop her Lovenox and decide also on  the dose of her Coumadin.   PROCEDURES:  On May 14, 2006, she underwent ultrasound venous duplex of  left lower extremity that showed extensive deep venous  thrombosis  throughout the left lower extremity.   CONSULTANTS:  [facility].   HISTORY OF PRESENT ILLNESS:  This is a 70 year old woman complex past  medical history as stated above, who the day of admission presented with  important lower extremity edema and also some pain, for this the reason  she consulted at [facility], where extensive lower extremity DVT was  found on ultrasound venous duplex.  Because of the extent of the DVT and  the past medical history of GI bleeding and also esophageal varices  doctors at [facility] decided to send her to [facility] to be evaluated  by [facility] surgeon to decide on need for surgery or IVC filter.  She was  admitted to our service.  She came already on heparin.  [facility] was  consulted.   HOSPITAL COURSE:  1. Extensive left lower extremity DVT:  As I already said, the patient      came already on heparin.  We continued the heparin  for the first 24      hours.  A hemoccult was done that was negative and also [facility] was      consulted who felt that there was no venous gangrene and also no      need for IVC filter.  After 24 hours, the patient was switched to      Lovenox.  She learned how to self administer it.  During the      hospitalization, the hemoglobin remained stable.  The patient did      not present any overt bleeding, so she was discharged home on      Coumadin and also Lovenox, and she is to follow up with her      cardiologist, Dr. Delbert Harness, to decide when to stop Lovenox and      decide on the definite dose of Coumadin.  2. For her other chronic medications, she was continued on her regular      medications she uses at home plus sliding scale insulin while she      was in house.  She was sent home on April the 11th.   DISCHARGE LABORATORIES:  PT-INR:  Prothrombin was 13.5, INR 1.0.  Hemoglobin A1c at 6.5, hepatitis C antibody negative.  Hepatitis B core  antibody IgM negative.  Hepatitis B surface antigen negative.  RPR was  nonreactive.  TSH  1.410.  RBC folate 1085, B12 of 974.  Sodium 142,  potassium 4.4, chloride 104, CO2 of 30, glucose 121, BUN 10, creatinine  0.76, total bilirubin 0.4, alkaline phosphatase 61, AST 22, ALT 42,  total protein 6.2, albumin 3.0, calcium 9.2.  Finally, hypercoagulable  panel was not done, as the patient was admitted already on heparin.      Dennis Bast, MD  Electronically Signed      C. Ulyess Mort, M.D.  Electronically Signed    YC/MEDQ  D:  05/19/2006  T:  05/19/2006  Job:  [id number]   cc:   Delbert Harness, MD

## 2010-06-22 NOTE — Op Note (Signed)
[facility]  Patient:    Rebecca [name], Rebecca [name]                   MRN: [id number] Proc. Date: 05/12/00 Adm. Date:  [id number] Attending:  Ollen Gross V                           Operative Report  PREOPERATIVE DIAGNOSIS:  Osteoarthritis, right hip.  POSTOPERATIVE DIAGNOSIS:  Osteoarthritis, right hip.  PROCEDURE:  Right total hip arthroplasty.  SURGEON:  Dr. Lequita Halt.  ASSISTANT:  Ralene Bathe, P.A.  ANESTHESIA:  Spinal.  ESTIMATED BLOOD LOSS:  500.  DRAIN:  Hemovac x 1.  COMPLICATIONS:  None.  CONDITION:  Stable to recovery.  BRIEF CLINICAL NOTE:  [name] is a 71 year old female with severe osteoarthritis of her right hip end-stage with pain refractory to nonoperative management. She presents now for right total hip arthroplasty.  DESCRIPTION OF PROCEDURE:  After successful administration of spinal anesthetic, the patient is placed in the left lateral decubitus position with the right side up and held with the hip positioner. The right lower extremity was isolated from her perineum with plastic drapes and prepped and draped in the usual sterile fashion. A standard posterolateral incision was made with a 10 blade through a very thick layer of subcutaneous tissue all the way down to her fascia lata which was incised in line with the skin incision. Short external rotators were isolated off the femur and capsulectomy performed. Posterior dislocation is performed and the center of the femoral head marked. Trial prosthesis placed such that the center of the trial head corresponds to the center of her native femoral head. An osteotomy was made with an oscillating saw. The femur was then retracted anteriorly, anterior capsule removed and acetabular retractors placed. The labrum was removed and then acetabular reaming initiated starting at a size 45 coursing in increments of 2 to a 51. A 52 mm Penacle acetabular shell was impacted into the  acetabulum with approximately 40 degrees of abduction, 20 degrees of forward flexion matching her native anteversion. The cup is then transfixed with two dome screws. A trial 28 mm 10 degree liner is placed with the high wall liner at the 11 oclock position.  The femur is then prepared with the axial reamers up to 13.5 mm and then proximally reamed to an 18 after the sleeve is changed to a large. An 7F large trial sleeve is placed, an 18 x 13, 36 plus 8 stem and a 28 plus zero head. I matched her native anteversion with the stem version and reduction was performed. She had outstanding stability. Full extension and full external rotation, 70 degrees of flexion and 40 degrees adduction, 90 degrees internal rotation and 90 degrees of flexion and 70 degrees of internal rotation. All trials are removed. The permanent apex hole eliminator is placed into the acetabular shell and then the permanent 28 mm 10 degree plus four liner is placed into the shell. The permanent 7F large sleeve, 18 x 13 stem with 36 plus 8 and then 28 plus zero head are placed on the femoral side. Reduction is again performed with same stability parameters. The wound is copiously irrigated with antibiotic solution and short external rotators reattached to the femur through drill holes. The fascia lata is closed over a Hemovac drain with interrupted #1 Vicryl. The subcu is closed in two layers with interrupted 1 and then 2-0 Vicryl. The subcuticular closed  with running 4-0 monocryl. The incision was cleaned an dried and Steri-Strips and a bulky sterile dressing applied. Drains hooked to suction. She was placed into a knee immobilizer, awakened and transported to recovery in stable condition. DD:  05/12/00 TD:  05/12/00 Job: [id number]

## 2010-06-22 NOTE — H&P (Signed)
Rebecca [name], Rebecca [name]                 ACCOUNT NO.:  [id number]   MEDICAL RECORD NO.:  [id number]          PATIENT TYPE:  AMB   LOCATION:  DAY                           FACILITY:  [facility]   PHYSICIAN:  Lionel December, M.D.    DATE OF BIRTH:  [birth year]   DATE OF ADMISSION:  DATE OF DISCHARGE:  LH                                HISTORY & PHYSICAL   CHIEF COMPLAINT:  Anemia.   HISTORY OF PRESENT ILLNESS:  The patient is a 70 year old Caucasian female  with a history of chronic GERD, iron-deficiency anemia, and tubular colonic  adenomas, as well as 2 lower gastric polyps, who presents for further  evaluation of anemia.  She was last seen on December 04, 2004, primarily for  a follow up and to get refills for her chronic GERD.  Please see below for  complete details of GI history.  She has been feeling fairly well, except  for in January she fell and hurt her left hip.  Initially, they thought it  was just a bad bruise, but over time, she had repeat studies and was told  that her hip was demolished.  She has been waiting for a hip replacement  by Dr. Sherlean Foot.  She says she went for preop last week, and it was found that  her hemoglobin was 9.8, and she was told that her surgery was being  cancelled until anemia was worked up.  We received labs from her preop today  which revealed a hemoglobin of 9.8, hematocrit of 30.8, MCV of 64.8, INR  0.9.  She says she has been having increased belching and regurgitation  postprandially, but does note that because of her hip she now lays down  shortly after eating.  Denies any abdominal pain, dysphagia or odynophagia.  Prilosec helps her heartburn.  Her bowel movements have been regular.  No  melena or rectal bleeding.  She restarted her iron last week when she found  out she was anemic.  The last time her hemoglobin was checked was back in  March of 2005, and at that time it was normal at 13.3.  This was after being  on chronic iron therapy for a history of GI  bleed.   CURRENT MEDICATIONS:  1.  Cardizem 360 mg daily.  2.  Potassium 10 mEq q.i.d.  3.  Clomipramine 25 mg q.i.d.  4.  Lotensin 20 mg daily.  5.  Avandamet 4 mg daily.  6.  Glucotrol 10 mg daily.  7.  HCTZ 50 mg daily.  8.  Ambien 10 mg daily.  9.  Remeron 15 mg daily.  10. Prilosec b.i.d.  11. Cipro for current urinary tract infection.  12. Cymbalta daily.   ALLERGIES:  1.  CODEINE.  2.  DEMEROL causes excitation.  3.  PENICILLIN.  4.  MORPHINE.   PAST MEDICAL HISTORY:  1.  Narcolepsy.  2.  Cataplexy.  3.  Diabetes mellitus.  4.  Hypertension.  5.  Left hip pain.  6.  Right hip replacement in 2002.  7.  Cholecystectomy.  8.  Cholecystectomy.  9.  Hysterectomy.  10. Hemorrhoidectomy.  11. Gastroesophageal reflux disease.  12. History of prolapsed colon.  13. Varicose veins.  14. Arthritis.  15. Non-malignant lumpectomy.  16. History of tubular adenoma of the sigmoid colon on Jun 20, 2000 on      colonoscopy with 5-6 mm.  Large external hemorrhoids and a few small      diverticula were seen, as well.  17. In December of 2004, she had an EGD for anemia and flare of chronic      GERD.  She had a small sliding hiatal hernia with wavy ulcerated GE      junction.  A long tubular ulcerated gastric polyp at the body, which was      snared and turned out to be hyperplastic.  18. Proximal gastritis.  19. H. pylori serologies were taken, but I do not see those results on the      chart.   FAMILY HISTORY:  Noncontributory.   SOCIAL HISTORY:  She is married and has 2 children.  She is disabled.  She  denies any current tobacco, alcohol or drug use.   REVIEW OF SYSTEMS:  See HPI for GI.  CONSTITUTIONAL:  She complains of  steady weight gain, but she was unable to weigh today.   PHYSICAL EXAMINATION:  VITAL SIGNS:  Blood pressure 138/90, pulse 76.  GENERAL:  A pleasant, anxious Caucasian female in no acute distress.  SKIN:  Warm and dry, no jaundice.  HEENT:   Conjunctivae are pink.  Sclerae are nonicteric.  Oropharyngeal  mucosa moist and pink.  No lymphadenopathy.  CHEST:  Lungs were clear to auscultation.  CARDIAC:  Regular rate and rhythm.  No murmurs, rubs, or gallops.  ABDOMEN:  Positive bowel sounds, obese, symmetrical, soft, nontender.  Examined in chair because she states she cannot get on the examination  table.  EXTREMITIES:  Trace bilateral edema in the lower extremities.   IMPRESSION:  [name] is a 70 year old lady with a history of chronic  gastroesophageal reflux disease, doing fairly well on the proton pump  inhibitor therapy.  She was recently found to have significant microcytic  anemia, most likely due to iron deficiency on preoperative studies for left  hip replacement.  Her surgery has since been postponed due to this newly  diagnosed anemia.  She had this back in 2004, as well.  At the time, it was  felt this could be due to an ulcerated gastric polyp, which turned out to be  hyperplastic.  Her hemoglobin normalized with iron therapy as of March of  2005.  She also has a history of colonic adenomatous polyps.  Her last  colonoscopy was in October of 2002.  She is due for a surveillance study,  and now that she has iron-deficiency anemia, she needs a diagnostic exam.  The patient complains of significant left hip pain and questions whether she  will be able to prep or go through colonoscopy, but she wants to try.   PLAN:  EGD and colonoscopy in the near future.  She will hold her iron  therapy for now.  The above has been discussed with Dr. Karilyn Cota, who is in  agreement.  Further recommendations to follow.      Tana Coast, P.A.      Lionel December, M.D.  Electronically Signed    LL/MEDQ  D:  06/11/2005  T:  06/11/2005  Job:  [id number]   cc:   Ollen Gross, M.D.  Fax: [phone]

## 2010-06-22 NOTE — Discharge Summary (Signed)
[name]  [name]:    Rebecca [name], Rebecca [name]                   MRN: [id number] Adm. Date:  [id number] Disc. Date: [id number] Attending:  Faith Rogue T Dictator:   Junie Bame, P.A. CC:         Ollen Gross, M.D.             Dr. Annye English             Dr. Hermelinda Dellen                           Discharge Summary  DISCHARGE DIAGNOSES: 1. Status post right total hip arthroplasty. 2. Hypotension. 3. Gastroesophageal reflux disease. 4. Hyperkalemia. 5. Prolapsed colon. 6. Cataplexy. 7. Narcolepsy.  HISTORY OF PRESENT ILLNESS:  Rebecca [name] is a 70 year old, white female with past medical history of hypotension and narcolepsy who was admitted to [facility] on May 09, 2000, for evaluation of progressing right hip pain x 3 years. Rebecca [name] underwent a right total hip arthroplasty on April 8, due to end-stage osteoarthritis by Dr. Lequita Halt.  Postoperative complications include nausea and vomiting, anemia and psychological disturbances.  PT reported Rebecca [name] at time was ambulating with minimal assistance, four feet with rolling walker and could transfer sit to stand with minimal assistance.  Rebecca [name] is presently on Coumadin for DVT prophylaxis.  PAST MEDICAL HISTORY: 1. Hypertension. 2. Narcolepsy. 3. Cataplexy. 4. Prolapsed colon. 5. Spinal stenosis. 6. Varicose veins. 7. Arthritis.  PAST SURGICAL HISTORY: 1. Cholecystectomy. 2. Hysterectomy. 3. Hemorrhoidectomy. 4. Tubal ligation. 5. Nonmalignant lumpectomy.  ALLERGIES:  DEMEROL, REGLAN, PENICILLIN, TAPE ADHESIVES, INNOZA, CAPOZIDE.  SOCIAL HISTORY:  Rebecca [name] is married and lives in [place] with quadriplegic husband.  Rebecca [name] lives in a one-level home.  Rebecca [name] has one step to entry.  Rebecca [name] is independent prior to admission.  Primary care Filiberto Wamble was Dr. Jabier Mutton.  HOSPITAL COURSE:  Rebecca [name] was admitted to rehabilitation on May 16, 2000, for comprehensive  inpatient rehabilitation and received more than three hours of physical therapy and occupational therapy daily.  Rebecca [name] did fairly well during her eight-day stay of rehabilitation.  Hospital course is significant for E. coli urinary tract infection, constipation and anxiety. Rebecca [name] was treated with Tequin for a urinary tract infection for seven days.  Blood pressure remains stable throughout her entire stay and pain was controlled with pain medicine regimen.  Rebecca [name] did have a mild episode of rectal bleeding due to her collapsed colon and constipation.  Rebecca [name] receives sorbitol as needed and was on Senokot S daily.  Rebecca [name] remained on Coumadin for DVT prophylaxis and was placed on Xanax 0.5 to 1 mg q.6h. for anxiety.  No other major medical issues occurred during her stay.  Latest labs indicated was that her hemoglobin was 11.5, hematocrit 33.4, platelets 372, potassium 3.9 and sodium 137, BUN 15 and creatinine 0.8.  Rebecca [name] remained on Trinsicon for anemia, but was later discontinued prior to discharge due to increased hemoglobin.  At Rebecca time of discharge, vital signs were stable.  Rebecca [name] had completed her course of Tequin for Rebecca urinary tract infection.  Rebecca [name] had no urinary symptoms.  Rebecca [name] was ambulating 60 feet with standard walker with minimal assistance.  Surgical site showed no signs of infection, no swelling and stitches were still intact with Steri-Strips.  Rebecca [name] was discharged to  home with family.  DISCHARGE MEDICATIONS:  1. Nexium 40 mg one tablet daily.  2. Lotensin 10 mg one tablet daily.  3. Ambien 10 mg one tablet at night as needed for sleep.  4. Premarin 1.25 mg one tablet daily.  5. Librax 1 mg one tablet four times daily.  6. Cardizem 360 mg one tablet in Rebecca morning.  7. Cylert 37.5 mg one tablet q.a.m.  8. Anafranil 25 mg one tablet four times daily.  9. Oxycodone 5 mg one to two tablets every four to six hours as needed for     pain. 10.  Coumadin one tablet 5 mg daily x 2 weeks.  ACTIVITY:  Uses walker and observe hip precautions with no driving.  DIET:  No restrictions.  SPECIAL INSTRUCTIONS:  Rebecca [name] was to receive [facility] for physical therapy, occupational therapy and nursing.  They are to monitor INR for Coumadin.  Her first draw was Monday.  FOLLOWUP:  Rebecca [name] is to follow up with Dr. Lequita Halt in two weeks in orthopedics. Follow up with Dr. Jabier Mutton, primary care physician, within four to six weeks.  Follow up with Dr. Karilyn Cota, gastroenterology, in four to six weeks. Follow up with Dr. Faith Rogue as needed. DD:  05/23/00 TD:  05/26/00 Job: [id number]

## 2010-06-22 NOTE — Op Note (Signed)
Rebecca [name], Rebecca [name]                 ACCOUNT NO.:  [id number]   MEDICAL RECORD NO.:  [id number]          PATIENT TYPE:  AMB   LOCATION:  DSC                          FACILITY:  [facility]   PHYSICIAN:  Katy Fitch. Sypher Jr., M.D.DATE OF BIRTH:  Oct 01, 1940   DATE OF PROCEDURE:  12/23/2003  DATE OF DISCHARGE:                                 OPERATIVE REPORT   PREOPERATIVE DIAGNOSIS:  Painful epidermal inclusion cyst right palm  adjacent to suture exit point, status post prior carpal tunnel release.   POSTOPERATIVE DIAGNOSIS:  Epidermal cyst with small suture fragment  identified in the subdermal region.   OPERATION:  Resection of epidermal cyst and removal of retained fragment of  Prolene suture.   ANESTHESIA:  0.25% Marcaine and 2% lidocaine palmar block, no sedation was  administered, this was performed in the minor operating room.   INDICATIONS FOR PROCEDURE:  Rebecca [name] is a 70 year old woman who recently  underwent a right carpal tunnel release and removal of a volar ganglion.  She had an excellent response to decompression of the median nerve and, thus  far, has shown no sign of recurrent ganglion formation.  She has had a  painful area develop at the base of her palm adjacent to the exit point of  the Prolene suture loop that was placed to close her wound and it is  presumed that she has an epidermal cyst from the suture or a remnant of the  suture left behind.  Due to a failure to respond to nonoperative measures,  she is brought to the operating room at this time for excision of her cyst  and exploration of her palm.   PROCEDURE:  Rebecca [name] is brought to the operating room and placed on  supine position on the operating table.  Following routine Betadine prep,  0.25% Marcaine and 2% lidocaine were infiltrated into the path of the  intended resection.  When anesthesia was satisfactory, the arm was  exsanguinated with direct pressure and an arterial tourniquet inflated to  250  mmHg on the proximal brachium.  An elliptical skin excision was  performed removing the cyst.  The subcutaneous tissues were dissected and a  small fragment of Prolene suture identified.  It is not exactly clear how  this suture remained within her skin.  The Prolene suture placed was a  continuous loop and must have broken at the time of initial attempt at  removal.  The wound was then repaired with inner suture of 5-0 nylon x 3.  The wound was dressed with a Tegaderm dressing and Ace wrap.   For aftercare, Rebecca [name] is given a prescription for Darvocet N100, 1 p.o.  q.4-6h. p.r.n. pain, 20 tablets without refill.  We will see her back in  approximately 7-9 days for suture removal and advancement to an exercise  program.      Robe   RVS/MEDQ  D:  12/23/2003  T:  12/23/2003  Job:  [id number]

## 2010-06-22 NOTE — H&P (Signed)
Rebecca [name], Rebecca [name]                 ACCOUNT NO.:  [id number]   MEDICAL RECORD NO.:  [id number]          PATIENT TYPE:  INP   LOCATION:  NA                           FACILITY:  [facility]   PHYSICIAN:  Ollen Gross, M.D.    DATE OF BIRTH:  21-Dec-1940   DATE OF ADMISSION:  09/02/2005  DATE OF DISCHARGE:                                HISTORY & PHYSICAL   DATE OF OFFICE VISIT/HISTORY AND PHYSICAL:  August 16, 2005.   DATE OF ADMISSION:  September 02, 2005.   CHIEF COMPLAINT:  Left hip pain.   HISTORY OF PRESENT ILLNESS:  Patient is a 70 year old female who was seen by  Dr. Lequita Halt for ongoing left hip pain.  She was seen by Dr. Thomasena Edis, who  involved Dr. Lequita Halt, and found to have end-stage arthritis.  She was  originally scheduled for hip surgery back in May, 2007, but due to a low  hemoglobin, she had to be worked up.  She has been treated and undergone EGD  and colonoscopy.  Felt that she had iron-deficiency anemia.  She has been  cleared for surgery and is rescheduled and presents for total hip  replacement.   ALLERGIES:  Multiple allergies, include CODEINE, COMPAZINE, DEMEROL,  METHYLATE/MERCURY COMPOUNDS, MORPHINE, NSAIDS, PENICILLIN, THIMEROSAL,  ADHESIVE TAPE.  She states that she also reacts to PAPER TAPE.  A documented  history of allergy to BLACK SUTURE.   THE PATIENT IS ABLE TO USE OPSITE FOR DRESSING COVERAGE.   CURRENT MEDICATIONS:  Potassium, Glucophage, Anafranil, Avandia, Cymbalta,  Remeron, Ambien, Senokot, Prilosec, Cardizem, Darvocet, Lotensin,  hydrochlorothiazide, B12, and iron.   PAST MEDICAL HISTORY:  Narcolepsy, cataplexy, gastroesophageal reflux  disease, hypertension, history of esophageal polyps, recently documented  esophageal varices, history of prolapsed colon, varicose veins, migraines,  anemia, history of hypokalemia, history of constipation, history of  postoperative nausea, spinal stenosis.   PAST SURGICAL HISTORY:  Right total hip replacement,  cholecystectomy,  hemorrhoid surgery, tubal ligation, lumpectomy, hysterectomy, multiple  kidney and bladder surgeries,including nephrostomy.   SOCIAL HISTORY:  Married.  She provides full care for her husband, who is  quadriplegic.  Denies using tobacco or alcohol products.  Two children.   FAMILY HISTORY:  Mother deceased, age 46, with a history of stroke, COPD,  heart failure, heart attack, and hypertension.  Father deceased, age 103,  with a history of heart attack, stroke, Alzheimer's.  She also has a brother  with a history of heart attack and back surgery.  Also, polio and  scleroderma.   REVIEW OF SYSTEMS:  GENERAL:  No fevers, chills, night sweats.  NEURO:  History of narcolepsy, cataplexy.  No seizures or syncope.  RESPIRATORY:  No  shortness of breath, productive cough, or hemoptysis.  CARDIOVASCULAR:  No  chest pain, angina, or orthopnea.  GI:  No nausea, vomiting, diarrhea, or  constipation.  She does have a history of esophageal varices, prolapsed  colon, history of constipation with a history of anemia.  Denies  any blood  or mucus in the stool.  GU:  No dysuria, hematuria, or discharge.  MUSCULOSKELETAL:  Left hip.   PHYSICAL EXAMINATION:  VITAL SIGNS:  Pulse 76, respirations 12, blood  pressure 137/68.  GENERAL:  A 70 year old white female, well-developed, well-nourished,  overweight, obese, no acute distress.  Alert, oriented and cooperative.  Pleasant.  Accompanied by her daughter.  HEENT:  Normocephalic and atraumatic.  Pupils are round and reactive.  Oropharynx is clear.  EOMs are intact.  NECK:  Supple.  No carotid bruits.  CHEST:  Clear anterior and posterior chest wall.  HEART:  Regular rate and rhythm.  No murmur.  ABDOMEN:  Soft, round, protuberant abdomen.  Bowel sounds present.  RECTAL/BREASTS/GENITALIA:  Not done.  Not pertinent to the present illness.  EXTREMITIES:  Left hip:  Moderate pain with any type of motion of the hip.  Flexion __________.   Internal rotation at 10 degrees of abduction and 15-20  degrees of external rotation.   IMPRESSION:  1. Osteoarthritis, left hip.  2. Narcolepsy.  3. Cataplexy.  4. Gastroesophageal reflux disease.  5. Hypertension.  6. History of esophageal polyps.  7. Recently diagnosed esophageal varices.  8. History of prolapsed colon.  9. Varicose veins.  10.History of migraines.  11.History of anemia.  12.History of hypokalemia.  13.History of constipation.  14.History of postoperative nausea.  15.Spinal stenosis.   PLAN:  Patient admitted to [facility] to undergo a left total hip  arthroplasty.  Surgery will be performed by Dr. Ollen Gross.      Alexzandrew L. Julien Girt, P.A.      Ollen Gross, M.D.  Electronically Signed    ALP/MEDQ  D:  09/01/2005  T:  09/01/2005  Job:  [id number]   cc:   Lionel December, M.D.  [address]  [place]  [place] [id number]   Hanley Hays Josefine Class, M.D.  Fax: [phone]   Melvyn Novas, MD  Fax: [phone]

## 2010-06-22 NOTE — Op Note (Signed)
Rebecca [name], Rebecca [name]                 ACCOUNT NO.:  [id number]   MEDICAL RECORD NO.:  [id number]          PATIENT TYPE:  INP   LOCATION:  1514                         FACILITY:  [facility]   PHYSICIAN:  Ollen Gross, M.D.    DATE OF BIRTH:  [birth year]   DATE OF PROCEDURE:  09/02/2005  DATE OF DISCHARGE:                                 OPERATIVE REPORT   PREOPERATIVE DIAGNOSIS:  Osteoarthritis left hip.   POSTOPERATIVE DIAGNOSIS:  Osteoarthritis left hip.   PROCEDURE:  Left total hip arthroplasty.   SURGEON:  Ollen Gross, M.D.   ASSISTANT:  Alexzandrew L. Julien Girt, P.A.   ANESTHESIA:  General.   ESTIMATED BLOOD LOSS:  600 mL.   DRAINS:  Hemovac x1.   COMPLICATIONS:  None.  Condition stable to recovery.   BRIEF CLINICAL NOTE:  Rebecca [name] is a 70 year old female with end-stage  arthritis of the left hip with intractable pain.  She has had a previous  successful right total hip arthroplasty, presents for left total hip  arthroplasty.   PROCEDURE IN DETAIL:  After successful administration of general anesthetic,  the patient was placed in a right lateral decubitus position with the left  side up and held with the hip positioner.  Left lower extremity was isolated  from her perineum with plastic drapes and prepped and draped in usual  sterile fashion.  Standard posterolateral incision is made with a 10 blade  through the very thick layer subcutaneous tissue down to the level of her  fascia lata was incised in line with the skin incision.  Sciatic nerve is  palpated and protected.  Short external rotators were isolated off the  femur.  Capsulectomy was performed.  Hip dislocated and the center of  femoral head marked.  Trial prosthesis placed such that the center of the  trial head corresponds to the center of her native femoral head.  Osteotomy  lines marked on the femoral neck and osteotomy made with an oscillating saw.  Femoral head removed and the femur retracted anteriorly to  gain acetabular  exposure.   The acetabular retractors were placed and osteophytes and labrum removed.  Reaming starts at 45 mm coursing increments of 2 up to 53 mm and a 54 mm  pinnacle acetabular shell was placed in anatomic position and transfixed  with two dome screws.  Trial 32 mm neutral +4 liner was placed.   On the femoral side we initiated femoral preparation with the canal finder  and irrigation.  Axial reaming is performed to 13.5 mm, proximal reaming to  18 after the sleeve machined to a large.  A 18 F large trial sleeve is  placed and 18 x 13 stem and 36 +8 neck matching her native anteversion.  32  +0 trial head is placed and the hip is reduced with outstanding stability.  She had full extension, flexion external rotation, 70 degrees flexion, 40  degrees adduction, 90 degrees internal rotation and 90 degrees of flexion,  70 degrees internal rotation.  By placing the left leg on top of the right  it felt as though  leg lengths were equal.   Hip was then dislocated.  All trials were removed.  The permanent apex hole  eliminator was placed into the acetabular shell and the permanent 32 mm  neutral +4 marathon liner is placed.  On the femoral side.  A 18 F large  sleeve is placed and 18 x 13 stem and 36 +8 neck matching native  anteversion.  A 32 +0 head is placed and the hips reduced the same stability  parameters.  Wound was copiously irrigated with saline solution and short  rotators reattached the femur through drill holes.  Fascia lata was closed  over Hemovac drain with interrupted #1 Vicryl, subcu closed in multiple  layers with number 1-0 and 2-0 Vicryl and subcuticular running 4-0 Monocryl.  Drains hooked to suction.  Incision cleaned and dried and Steri-Strips and  bulky sterile dressing applied.  She is placed into a knee immobilizer,  awakened and transferred to recovery in stable condition.  Dr. was 89 grams  this over is      Ollen Gross, M.D.   Electronically Signed     FA/MEDQ  D:  09/02/2005  T:  09/03/2005  Job:  [id number]

## 2010-06-22 NOTE — Op Note (Signed)
Rebecca [name], Rebecca [name]                 ACCOUNT NO.:  [id number]   MEDICAL RECORD NO.:  [id number]          PATIENT TYPE:  AMB   LOCATION:  DAY                           FACILITY:  [facility]   PHYSICIAN:  Lionel December, M.D.    DATE OF BIRTH:  10/03/1940   DATE OF PROCEDURE:  06/20/2005  DATE OF DISCHARGE:                                 OPERATIVE REPORT   PROCEDURE:  Esophagogastroduodenoscopy with followed by colonoscopy.   INDICATION:  Rebecca [name] is a 70 year old Caucasian female who recently had preop  evaluation for hip surgery and was noted to be anemic with a hemoglobin of  9.8 grams, her MCV is low.  She has chronic GERD and is maintained on double  dose PPI.  She also has had intermittent hematochezia.  She passed a  moderate to large amount of blood a few days ago.  She had her last  colonoscopy five years ago with removal of a single adenoma.  The procedure  is reviewed with the patient and informed consent was obtained.   MEDS FOR CONSCIOUS SEDATION:  Benzocaine spray pharyngeal topical  anesthesia, fentanyl 50 mcg IV, Versed 10 mg IV.   FINDINGS:  The procedures were performed in the endoscopy suite.  The  patient's vital signs and O2 sats were monitored during the procedure and  remained stable.   PROCEDURES:  1.  Esophagogastroduodenoscopy.  The patient was placed in the left lateral      position.  The Olympus videoscope was passed into the oropharynx without      any difficulty to esophagus.   Esophagus.  The mucosa of the esophagus was normal.  The GE junction was  located at 35 cm from the incisors.  There was no stricture noted.  The  hiatus was at 40 cm.  She has a small to moderate size sliding hiatal  hernia.  The mucosa of the hernia and part of the stomach was normal.  She  had three columns of grade 1 esophageal varices at distal 5-6 cm.   The stomach was empty and distended very well with insufflation.  The folds  in the proximal stomach were normal.  The mucosa at  the body, antrum,  pyloric channel, as well as the angularis, fundus and cardia was normal.   Duodenum:  Bulbar mucosa was normal except there was a single small polyp  about 3 x 6 mm.  This was easily ablated via cold biopsy.  The mucosa and  folds in the second part of the duodenum were normal.  Pictures were taken  for the record.  The endoscope was withdrawn and the patient prepared for  procedure #2.   1.  Colonoscopy.  A rectal examination was performed.  No abnormality noted      on external or digital exam.  The Olympus videoscope was placed in the      rectum and advanced under direct vision into the sigmoid colon and      beyond.  Preparation was satisfactory.  She had scattered diverticula at      the sigmoid and descending  colon, some of these moderate to large in      size.  The scope was passed into the cecum which was identified by the      ileocecal valve and appendiceal orifice.  Pictures were taken for the      record.  There was a small polyp close to appendiceal orifice which was      ablated via cold biopsy.  As the scope was withdrawn, the colonic mucosa      was once again carefully examined and was normal throughout.  The rectal      mucosa similarly was normal.  The scope was retroflexed to examine the      anorectal junction and she had hemorrhoids below the dentate line,      erythema at the dentate line, and a small clot involving the anal canal.      Pictures were taken for the record.  The endoscope was withdrawn.  The      patient tolerated the procedures well.   FINAL DIAGNOSIS:  1.  Small to moderate size sliding hiatal hernia.  Esophagitis has      completely healed since previous EGD of December 2004.  2.  Small polyp ablated via cold biopsy from duodenal bulb.  3.  Left colonic diverticulosis.  4.  External hemorrhoids with small clots felt to be the source of recent      hematochezia.   Please note, her H&H from this morning is 9.1 and 29.2, her  MCV is low.  Her  H&H two weeks ago was 9.8 and 30.8.   I suspect her anemia is multifactorial. She may also have impaired iron  absorption. Nevertheless, multiple deficiency syndrome needs to be ruled  out.   RECOMMENDATIONS:  1.  She will continue anti-reflux measures and Prilosec as before.  2.  She will continue ferrous sulfate 325 mg p.o. b.i.d..  3.  Anusol-HC suppository one per rectum at bedtime for two weeks.  4.  We will check her serum iron for B12 deficiency.  5.  She has three columns of grade 1 esophageal varices.  Recent LFTs were      normal.  She does not have any stigmata of chronic liver disease but      this needs to be further evaluated with hepatic ultrasound.   RECOMMENDATIONS:  1.  She will continue anti-reflux measures and Prilosec as before.  2.  Ferrous sulfate 325 mg p.o. b.i.d.  3.  Anusol-HC suppository one per rectum at bedtime for 2 weeks.  4.  Will check her iron, TIBC, ferritin, B12 and folate levels and then and      schedule for hepatic ultrasound.      Lionel December, M.D.  Electronically Signed     NR/MEDQ  D:  06/20/2005  T:  06/20/2005  Job:  [id number]   cc:   Hanley Hays. Josefine Class, M.D.  Fax: [phone]

## 2010-09-20 ENCOUNTER — Emergency Department (HOSPITAL_COMMUNITY)
Admission: EM | Admit: 2010-09-20 | Discharge: 2010-09-20 | Disposition: A | Discharge status: Home / Self Care | Payer: Medicare Other | Attending: Emergency Medicine | Admitting: Emergency Medicine

## 2010-09-20 ENCOUNTER — Encounter: Payer: Self-pay | Admitting: *Deleted

## 2010-09-20 DIAGNOSIS — Z7901 Long term (current) use of anticoagulants: Secondary | ICD-10-CM | POA: Insufficient documentation

## 2010-09-20 DIAGNOSIS — Z86718 Personal history of other venous thrombosis and embolism: Secondary | ICD-10-CM | POA: Insufficient documentation

## 2010-09-20 DIAGNOSIS — E119 Type 2 diabetes mellitus without complications: Secondary | ICD-10-CM | POA: Insufficient documentation

## 2010-09-20 DIAGNOSIS — I8390 Asymptomatic varicose veins of unspecified lower extremity: Secondary | ICD-10-CM | POA: Insufficient documentation

## 2010-09-20 HISTORY — DX: Polyneuropathy, unspecified: G62.9

## 2010-09-20 NOTE — ED Provider Notes (Signed)
Scribed for Dr. Arizona Constable, the patient was seen in room APA07 . This chart was scribed by Ellie Lunch. This patient's care was started at 10:32 AM.   CSN: [id number] Arrival date & time: 09/20/2010 10:14 AM  Chief Complaint  Patient presents with  . Circulatory Problem   HPI  Rebecca [name] is a 70 y.o. female on coumadin brought in by ambulance to the Emergency Department complaining of burst blood vessel on her left foot. Last checked by pcp week.  Pt states inr check 2 days ago was normal. No other abn bleeding or bruising. No trauma to leg. States superfiical vein to left ankle had bled pta. Stopped w direct pressure. No faintness or dizziness.   Past Medical History  Diagnosis Date  . Diabetes mellitus   . DVT (deep venous thrombosis)   . Neuropathy   . Narcolepsy     History reviewed. No pertinent past surgical history.  MEDICATIONS:  Previous Medications   DILTIAZEM (CARDIZEM CD) 180 MG 24 HR CAPSULE    Take 360 mg by mouth daily.       ALLERGIES:  Allergies as of 09/20/2010 - Review Complete 09/20/2010  Allergen Reaction Noted  . Compazine Anaphylaxis 09/20/2010  . Morphine and related Other (See Comments) 09/20/2010      No family history on file.  History  Substance Use Topics  . Smoking status: Not on file  . Smokeless tobacco: Not on file  . Alcohol Use:      Review of Systems  Constitutional: Negative for fever and chills.  Respiratory: Negative for shortness of breath.   Musculoskeletal:       No leg pain  Neurological: Negative for dizziness and headaches.  All other systems reviewed and are negative.    Physical Exam  BP 126/59  Pulse 88  Temp(Src) 97.7 F (36.5 C) (Oral)  Resp 16  SpO2 96%  Physical Exam  Nursing note and vitals reviewed. Constitutional: She is oriented to person, place, and time. She appears well-developed and well-nourished.  HENT:  Head: Normocephalic.  Eyes: EOM are normal. No scleral icterus.  Neck: Normal range  of motion.  Pulmonary/Chest: Effort normal.  Musculoskeletal: Normal range of motion.  Neurological: She is alert and oriented to person, place, and time.  Psychiatric: She has a normal mood and affect.  superficial varicose veins to bilateral extremities. No active bleeding.   OTHER DATA REVIEWED: Nursing notes, vital signs, and past medical records reviewed.  Oxygen saturation is 96% on room air, adequate by my interpretation.   LABS / RADIOLOGY:   MDM: bleeding stopped. Sterile dressing. Pt reports inr was normal on check from day prior and refuses lab work/inr in ed today. No other abn bleeding or bruising.    MEDICATIONS GIVEN IN THE E.D. Medications - No data to display   DISCHARGE MEDICATIONS: New Prescriptions   No medications on file    I personally performed the services described in this documentation, which was scribed in my presence. The recorded information has been reviewed and considered. Dr. Denton Lank   Procedures  MDM       Suzi Roots, MD 09/20/10 [phone]

## 2010-09-20 NOTE — ED Notes (Signed)
Pt working on finding ride back to home. No further needs at this time.

## 2010-09-20 NOTE — ED Notes (Signed)
Per EMS pt from home. Has hx of peripheral vascular problems. States this am was standing and while cooking a blood vessel in her foot burst. EMS reports approximately 150-200cc blood loss. Foot wrapped and bleeding controlled at this time.

## 2010-10-23 ENCOUNTER — Encounter (HOSPITAL_COMMUNITY): Payer: Self-pay | Admitting: *Deleted

## 2010-10-23 ENCOUNTER — Inpatient Hospital Stay (HOSPITAL_COMMUNITY)
Admission: AD | Admit: 2010-10-23 | Discharge: 2010-10-30 | DRG: 603 | Disposition: A | Discharge status: Home Health | Payer: Medicare Other | Source: Ambulatory Visit | Attending: Family Medicine | Admitting: Family Medicine

## 2010-10-23 DIAGNOSIS — E119 Type 2 diabetes mellitus without complications: Secondary | ICD-10-CM | POA: Diagnosis present

## 2010-10-23 DIAGNOSIS — Z86718 Personal history of other venous thrombosis and embolism: Secondary | ICD-10-CM

## 2010-10-23 DIAGNOSIS — I10 Essential (primary) hypertension: Secondary | ICD-10-CM | POA: Diagnosis present

## 2010-10-23 DIAGNOSIS — E785 Hyperlipidemia, unspecified: Secondary | ICD-10-CM | POA: Diagnosis present

## 2010-10-23 DIAGNOSIS — L02219 Cutaneous abscess of trunk, unspecified: Principal | ICD-10-CM

## 2010-10-23 DIAGNOSIS — B964 Proteus (mirabilis) (morganii) as the cause of diseases classified elsewhere: Secondary | ICD-10-CM | POA: Diagnosis present

## 2010-10-23 DIAGNOSIS — L03319 Cellulitis of trunk, unspecified: Principal | ICD-10-CM | POA: Diagnosis present

## 2010-10-23 DIAGNOSIS — G4700 Insomnia, unspecified: Secondary | ICD-10-CM | POA: Diagnosis present

## 2010-10-23 DIAGNOSIS — F429 Obsessive-compulsive disorder, unspecified: Secondary | ICD-10-CM | POA: Diagnosis present

## 2010-10-23 DIAGNOSIS — Z880 Allergy status to penicillin: Secondary | ICD-10-CM

## 2010-10-23 DIAGNOSIS — F411 Generalized anxiety disorder: Secondary | ICD-10-CM | POA: Diagnosis present

## 2010-10-23 DIAGNOSIS — B356 Tinea cruris: Secondary | ICD-10-CM | POA: Diagnosis present

## 2010-10-23 DIAGNOSIS — Z7901 Long term (current) use of anticoagulants: Secondary | ICD-10-CM

## 2010-10-23 DIAGNOSIS — B354 Tinea corporis: Secondary | ICD-10-CM | POA: Diagnosis present

## 2010-10-23 LAB — COMPREHENSIVE METABOLIC PANEL
AST: 16 U/L (ref 0–37)
Albumin: 3.4 g/dL — ABNORMAL LOW (ref 3.5–5.2)
BUN: 21 mg/dL (ref 6–23)
Chloride: 95 mEq/L — ABNORMAL LOW (ref 96–112)
Creatinine, Ser: 0.99 mg/dL (ref 0.50–1.10)
Potassium: 4.4 mEq/L (ref 3.5–5.1)
Total Bilirubin: 0.3 mg/dL (ref 0.3–1.2)
Total Protein: 7.3 g/dL (ref 6.0–8.3)

## 2010-10-23 LAB — PROTIME-INR
INR: 2.27 — ABNORMAL HIGH (ref 0.00–1.49)
Prothrombin Time: 25.4 seconds — ABNORMAL HIGH (ref 11.6–15.2)

## 2010-10-23 LAB — CBC
HCT: 42.7 % (ref 36.0–46.0)
MCHC: 34 g/dL (ref 30.0–36.0)
MCV: 86.8 fL (ref 78.0–100.0)
Platelets: 283 10*3/uL (ref 150–400)
RDW: 13.7 % (ref 11.5–15.5)
WBC: 14.9 10*3/uL — ABNORMAL HIGH (ref 4.0–10.5)

## 2010-10-23 LAB — GLUCOSE, CAPILLARY

## 2010-10-23 MED ORDER — INSULIN ASPART 100 UNIT/ML ~~LOC~~ SOLN
0.0000 [IU] | Freq: Every day | SUBCUTANEOUS | Status: DC
  Administered 2010-10-23 – 2010-10-28 (×5): 2 [IU] via SUBCUTANEOUS

## 2010-10-23 MED ORDER — LINAGLIPTIN 5 MG PO TABS
5.0000 mg | ORAL_TABLET | Freq: Every day | ORAL | Status: DC
  Administered 2010-10-23 – 2010-10-30 (×8): 5 mg via ORAL
  Filled 2010-10-23 (×9): qty 1

## 2010-10-23 MED ORDER — METFORMIN HCL 500 MG PO TABS
500.0000 mg | ORAL_TABLET | Freq: Two times a day (BID) | ORAL | Status: DC
  Administered 2010-10-23 – 2010-10-30 (×14): 500 mg via ORAL
  Filled 2010-10-23 (×14): qty 1

## 2010-10-23 MED ORDER — WARFARIN SODIUM 5 MG PO TABS
5.0000 mg | ORAL_TABLET | Freq: Once | ORAL | Status: DC
Start: 2010-10-23 — End: 2010-10-23

## 2010-10-23 MED ORDER — SODIUM CHLORIDE 0.9 % IJ SOLN
INTRAMUSCULAR | Status: AC
  Filled 2010-10-23: qty 10

## 2010-10-23 MED ORDER — LORAZEPAM 0.5 MG PO TABS
0.5000 mg | ORAL_TABLET | Freq: Four times a day (QID) | ORAL | Status: DC | PRN
  Administered 2010-10-23: 0.5 mg via ORAL
  Filled 2010-10-23: qty 1

## 2010-10-23 MED ORDER — DILTIAZEM HCL ER COATED BEADS 240 MG PO CP24
240.0000 mg | ORAL_CAPSULE | Freq: Every day | ORAL | Status: DC
  Administered 2010-10-24 – 2010-10-30 (×7): 240 mg via ORAL
  Filled 2010-10-23 (×8): qty 1

## 2010-10-23 MED ORDER — HYDROMORPHONE HCL 1 MG/ML IJ SOLN
2.0000 mg | INTRAMUSCULAR | Status: DC | PRN
  Administered 2010-10-26: 1 mg via INTRAVENOUS
  Administered 2010-10-27 (×2): 2 mg via INTRAVENOUS
  Filled 2010-10-23: qty 2
  Filled 2010-10-23: qty 1
  Filled 2010-10-23: qty 2

## 2010-10-23 MED ORDER — DULOXETINE HCL 60 MG PO CPEP
60.0000 mg | ORAL_CAPSULE | Freq: Every day | ORAL | Status: DC
  Administered 2010-10-23 – 2010-10-30 (×8): 60 mg via ORAL
  Filled 2010-10-23 (×8): qty 1

## 2010-10-23 MED ORDER — CLOMIPRAMINE HCL 25 MG PO CAPS
25.0000 mg | ORAL_CAPSULE | Freq: Four times a day (QID) | ORAL | Status: DC
  Filled 2010-10-23 (×4): qty 1

## 2010-10-23 MED ORDER — CLOMIPRAMINE HCL 25 MG PO CAPS
25.0000 mg | ORAL_CAPSULE | Freq: Four times a day (QID) | ORAL | Status: DC
  Administered 2010-10-23 – 2010-10-30 (×27): 25 mg via ORAL
  Filled 2010-10-23 (×32): qty 1

## 2010-10-23 MED ORDER — WARFARIN SODIUM 5 MG PO TABS
5.0000 mg | ORAL_TABLET | Freq: Every day | ORAL | Status: DC
  Administered 2010-10-23 – 2010-10-29 (×7): 5 mg via ORAL
  Filled 2010-10-23 (×7): qty 1

## 2010-10-23 MED ORDER — ZOLPIDEM TARTRATE 5 MG PO TABS
5.0000 mg | ORAL_TABLET | Freq: Every evening | ORAL | Status: DC | PRN
  Filled 2010-10-23: qty 2

## 2010-10-23 MED ORDER — INSULIN ASPART 100 UNIT/ML ~~LOC~~ SOLN
0.0000 [IU] | Freq: Three times a day (TID) | SUBCUTANEOUS | Status: DC
  Administered 2010-10-23: 8 [IU] via SUBCUTANEOUS
  Administered 2010-10-24: 5 [IU] via SUBCUTANEOUS
  Administered 2010-10-24 – 2010-10-25 (×3): 3 [IU] via SUBCUTANEOUS
  Administered 2010-10-25: 2 [IU] via SUBCUTANEOUS
  Administered 2010-10-25: 5 [IU] via SUBCUTANEOUS
  Administered 2010-10-26: 3 [IU] via SUBCUTANEOUS
  Administered 2010-10-26: 2 [IU] via SUBCUTANEOUS
  Administered 2010-10-26: 5 [IU] via SUBCUTANEOUS
  Administered 2010-10-27: 3 [IU] via SUBCUTANEOUS
  Administered 2010-10-27: 5 [IU] via SUBCUTANEOUS
  Administered 2010-10-27: 3 [IU] via SUBCUTANEOUS
  Administered 2010-10-28: 5 [IU] via SUBCUTANEOUS
  Administered 2010-10-28: 3 [IU] via SUBCUTANEOUS
  Administered 2010-10-28: 2 [IU] via SUBCUTANEOUS
  Administered 2010-10-29: 8 [IU] via SUBCUTANEOUS
  Administered 2010-10-29: 2 [IU] via SUBCUTANEOUS
  Administered 2010-10-29: 5 [IU] via SUBCUTANEOUS
  Administered 2010-10-30: 3 [IU] via SUBCUTANEOUS
  Administered 2010-10-30: 2 [IU] via SUBCUTANEOUS
  Filled 2010-10-23: qty 3

## 2010-10-23 MED ORDER — HYDROCODONE-ACETAMINOPHEN 5-325 MG PO TABS
1.0000 | ORAL_TABLET | Freq: Four times a day (QID) | ORAL | Status: DC | PRN
  Administered 2010-10-23 – 2010-10-26 (×4): 1 via ORAL
  Filled 2010-10-23 (×4): qty 1

## 2010-10-23 MED ORDER — INSULIN GLARGINE 100 UNIT/ML ~~LOC~~ SOLN
30.0000 [IU] | Freq: Every day | SUBCUTANEOUS | Status: DC
  Administered 2010-10-23 – 2010-10-29 (×7): 30 [IU] via SUBCUTANEOUS
  Filled 2010-10-23: qty 3

## 2010-10-23 MED ORDER — DEXTROSE 5 % IV SOLN
1.0000 g | INTRAVENOUS | Status: DC
  Administered 2010-10-23 – 2010-10-25 (×3): 1 g via INTRAVENOUS
  Filled 2010-10-23 (×7): qty 1

## 2010-10-23 MED ORDER — TERBINAFINE HCL 1 % EX CREA
TOPICAL_CREAM | Freq: Two times a day (BID) | CUTANEOUS | Status: DC
  Administered 2010-10-23: 22:00:00 via TOPICAL
  Administered 2010-10-24: 1 via TOPICAL
  Administered 2010-10-24 – 2010-10-25 (×2): via TOPICAL
  Administered 2010-10-25: 1 via TOPICAL
  Administered 2010-10-26 – 2010-10-29 (×8): via TOPICAL
  Filled 2010-10-23 (×5): qty 12

## 2010-10-23 MED ORDER — POTASSIUM CHLORIDE CRYS ER 10 MEQ PO TBCR
10.0000 meq | EXTENDED_RELEASE_TABLET | Freq: Every day | ORAL | Status: DC
  Administered 2010-10-23 – 2010-10-30 (×8): 10 meq via ORAL
  Filled 2010-10-23 (×8): qty 1

## 2010-10-23 MED ORDER — DOCUSATE SODIUM 100 MG PO CAPS
100.0000 mg | ORAL_CAPSULE | Freq: Every day | ORAL | Status: DC
  Administered 2010-10-23 – 2010-10-30 (×8): 100 mg via ORAL
  Filled 2010-10-23 (×8): qty 1

## 2010-10-23 MED ORDER — SODIUM CHLORIDE 0.9 % IV SOLN
INTRAVENOUS | Status: DC
  Administered 2010-10-23: 17:00:00 via INTRAVENOUS
  Administered 2010-10-24: 100 mL via INTRAVENOUS
  Administered 2010-10-24: 03:00:00 via INTRAVENOUS
  Administered 2010-10-25 – 2010-10-26 (×3): 1000 mL via INTRAVENOUS
  Administered 2010-10-29: 500 mL via INTRAVENOUS

## 2010-10-23 MED ORDER — ZOLPIDEM TARTRATE 5 MG PO TABS
10.0000 mg | ORAL_TABLET | Freq: Every evening | ORAL | Status: DC | PRN
  Administered 2010-10-23: 10 mg via ORAL
  Filled 2010-10-23: qty 2

## 2010-10-23 MED ORDER — MIRTAZAPINE 30 MG PO TABS
15.0000 mg | ORAL_TABLET | Freq: Every day | ORAL | Status: DC
  Administered 2010-10-23 – 2010-10-29 (×7): 15 mg via ORAL
  Filled 2010-10-23 (×7): qty 1

## 2010-10-23 NOTE — Progress Notes (Signed)
Patient ordered Lovenox per pharmacy for VTE prophylaxis.  INR Last Three Days: Recent Labs  [facility] 10/23/10 1504   INR 2.27*    Patient on Warfarin 5mg  daily as taken at home.  MD currently dosing.  Plan: I have stopped the Lovenox consult due to therapeutic INR. Will follow peripherally while in the hospital and receiving Warfarin per MD.

## 2010-10-24 ENCOUNTER — Other Ambulatory Visit: Payer: Self-pay

## 2010-10-24 LAB — HEPATIC FUNCTION PANEL
ALT: 28 U/L (ref 0–35)
AST: 18 U/L (ref 0–37)
Albumin: 3.1 g/dL — ABNORMAL LOW (ref 3.5–5.2)
Alkaline Phosphatase: 89 U/L (ref 39–117)
Indirect Bilirubin: 0.3 mg/dL (ref 0.3–0.9)
Total Protein: 7.4 g/dL (ref 6.0–8.3)

## 2010-10-24 LAB — DIFFERENTIAL
Basophils Absolute: 0 10*3/uL (ref 0.0–0.1)
Basophils Relative: 0 % (ref 0–1)
Eosinophils Absolute: 0.2 10*3/uL (ref 0.0–0.7)
Eosinophils Relative: 2 % (ref 0–5)
Monocytes Absolute: 1.1 10*3/uL — ABNORMAL HIGH (ref 0.1–1.0)
Monocytes Relative: 10 % (ref 3–12)
Neutro Abs: 7.7 10*3/uL (ref 1.7–7.7)

## 2010-10-24 LAB — GLUCOSE, CAPILLARY: Glucose-Capillary: 223 mg/dL — ABNORMAL HIGH (ref 70–99)

## 2010-10-24 LAB — CBC
HCT: 42.4 % (ref 36.0–46.0)
Hemoglobin: 14.6 g/dL (ref 12.0–15.0)
MCH: 29.7 pg (ref 26.0–34.0)
MCHC: 34.4 g/dL (ref 30.0–36.0)
MCV: 86.2 fL (ref 78.0–100.0)
RDW: 13.7 % (ref 11.5–15.5)

## 2010-10-24 LAB — PROTIME-INR: INR: 2.28 — ABNORMAL HIGH (ref 0.00–1.49)

## 2010-10-24 MED ORDER — ZOLPIDEM TARTRATE 5 MG PO TABS
10.0000 mg | ORAL_TABLET | Freq: Every evening | ORAL | Status: DC | PRN
  Administered 2010-10-24 – 2010-10-29 (×5): 10 mg via ORAL
  Filled 2010-10-24 (×5): qty 2

## 2010-10-24 MED ORDER — VANCOMYCIN HCL IN DEXTROSE 1-5 GM/200ML-% IV SOLN
1000.0000 mg | Freq: Two times a day (BID) | INTRAVENOUS | Status: DC
  Administered 2010-10-24 – 2010-10-25 (×3): 1000 mg via INTRAVENOUS
  Filled 2010-10-24 (×4): qty 200

## 2010-10-24 MED ORDER — VANCOMYCIN HCL IN DEXTROSE 1-5 GM/200ML-% IV SOLN
1000.0000 mg | INTRAVENOUS | Status: AC
  Administered 2010-10-24: 1000 mg via INTRAVENOUS
  Filled 2010-10-24: qty 200

## 2010-10-24 MED ORDER — ASPIRIN 81 MG PO CHEW
81.0000 mg | CHEWABLE_TABLET | Freq: Every day | ORAL | Status: DC
  Administered 2010-10-24 – 2010-10-30 (×7): 81 mg via ORAL
  Filled 2010-10-24 (×7): qty 1

## 2010-10-24 NOTE — H&P (Signed)
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  She is allergic to PENICILLIN.  SOCIAL HISTORY:  She lives alone, widowed in the last 2 years, never smoke and does not imbibe alcohol.  CURRENT MEDICATIONS: 1. Cardizem 360 p.o. daily. 2. K-Dur 10 mEq daily. 3. Anafranil 25 mg p.o. q.i.d. 4. Lotensin 20 mg p.o. daily. 5. Ambien 10 mg p.o. at bedtime p.r.n. 6. Metformin 500 mg p.o. b.i.d. 7. Cymbalta 60 mg p.o. daily. 8. Coumadin 5 mg p.o. daily. 9. Lantus insulin 34 units at bedtime daily. 10.Onglyza 5 mg p.o. daily. 11.Aspirin 81 mg p.o. daily.  REVIEW SYSTEMS:  Negative for seizures, tremors, polyuria, polydipsia, hematuria, melena, or hematochezia.  PHYSICAL EXAMINATION:  VITAL SIGNS:  Blood pressure at present is 153/74, temperature 97.6, pulse 77 and regular, respiratory rate is 20, and O2 sat is 90% on room air. HEAD:  Normocephalic and atraumatic. EYES:  PERRL.  EOMs intact.  Sclerae are clear.  Conjunctivae the pink. NECK:  No JVD.  No carotid bruits.  No thyromegaly or thyroid bruits. LUNGS:  Diminished breath sounds at bases.  No rales, wheeze, or rhonchi appreciable. HEART:  Regular rate and rhythm.  No murmurs, gallops, heaves, thrills, or rubs. ABDOMEN:  Obese, soft, and nontender.  There is diffuse erythema and excoriation underside of penis and lower abdomen, pelvic area, and perineal area and also between buttocks. EXTREMITIES:  Trace to 1+ pedal edema. NEUROLOGIC:  Cranial nerves II through XII are grossly intact.  The patient moves all 4  extremities.  Plantars are downgoing.  IMPRESSION: 1. Diffuse tinea cruris, tinea corporis, acute on chronic with skin     breakdown and superimposed bacterial infection. 2. Insulin diabetes. 3. Obesity. 4. Hypertension. 5. Hyperlipidemia. 6. Insulin-dependent diabetes. 7. Obsessive compulsive disorder. 8. Generalized anxiety disorder. 9. Chronic insomnia. 10.Hyperlipidemia. 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  The plan at present is to give blood cultures, skin cultures, IV Rocephin empirically 1 g q.24 h., Lamisil cream 1% to areas b.i.d.  We will consider surgical consultation and we will continue insulin a.c. and at bedtime glucose and NovoLog sliding scale and make further recommendations as the database expands.     Melvyn Novas, MD     RMD/MEDQ  D:  10/24/2010  T:  10/24/2010  Job:  [id number]

## 2010-10-24 NOTE — Consult Note (Signed)
Reason for Consult: Cellulitis Referring Physician: Dr. Delbert Harness  Rebecca [name] is an 70 y.o. female.  HPI: Patient presented to [facility] with increasing lower domino pain in the skin. This area has been raw for several days. He was initially treated as a fungal/yeast infection with no significant improvement. Patient is diabetic although she states her blood sugars have not run excessively high she typically states they run in the low 200s. She has had some subjective fevers and chills associated with this. She has exquisite pain with palpation or anything touching this area. It does feel better with warm water in this area. She's not had any other symptomatology similar to this in the past. She's not had any nausea or vomiting. No change in bowel history.  Past Medical History  Diagnosis Date  . Diabetes mellitus   . DVT (deep venous thrombosis)   . Neuropathy   . Narcolepsy     No past surgical history on file.  No family history on file.  Social History:  does not have a smoking history on file. She does not have any smokeless tobacco history on file. Her alcohol and drug histories not on file.  Allergies:  Allergies  Allergen Reactions  . Compazine Anaphylaxis  . Other Anaphylaxis    Thermasol  . Penicillins Shortness Of Breath and Rash  . Morphine And Related Other (See Comments)    Patient states that it makes her lose her state of mind.     Medications:  Prior to Admission:  Prescriptions prior to admission  Medication Sig Dispense Refill  . benazepril-hydrochlorthiazide (LOTENSIN HCT) 20-25 MG per tablet Take 1 tablet by mouth daily.        . celecoxib (CELEBREX) 200 MG capsule Take 200 mg by mouth daily as needed. For arthritis      . clomiPRAMINE (ANAFRANIL) 25 MG capsule Take 25 mg by mouth at bedtime.        Marland Kitchen diltiazem (CARDIZEM CD) 180 MG 24 hr capsule Take 360 mg by mouth daily.       . DULoxetine (CYMBALTA) 60 MG capsule Take 60 mg by mouth daily.         . fish oil-omega-3 fatty acids 1000 MG capsule Take 1 g by mouth daily.        . insulin glargine (LANTUS) 100 UNIT/ML injection Inject 34 Units into the skin at bedtime.        . metFORMIN (GLUCOPHAGE) 500 MG tablet Take 500 mg by mouth 2 (two) times daily with a meal.        . mirtazapine (REMERON) 15 MG tablet Take 15 mg by mouth at bedtime.        . Multiple Vitamins-Minerals (MULTIVITAMIN WITH MINERALS) tablet Take 1 tablet by mouth daily.        Marland Kitchen omeprazole (PRILOSEC) 20 MG capsule Take 20 mg by mouth 2 (two) times daily.        . potassium chloride (MICRO-K) 10 MEQ CR capsule Take 10 mEq by mouth 4 (four) times daily.       Marland Kitchen PRESCRIPTION MEDICATION Take 1 tablet by mouth daily. Ferrocet Iron Tablet       . primidone (MYSOLINE) 50 MG tablet Take 50 mg by mouth daily.        . saxagliptin HCl (ONGLYZA) 5 MG TABS tablet Take 5 mg by mouth daily.        Marland Kitchen warfarin (COUMADIN) 5 MG tablet Take 5 mg by mouth daily.       Marland Kitchen  zolpidem (AMBIEN) 10 MG tablet Take 10 mg by mouth at bedtime.         Scheduled:   . aspirin  81 mg Oral Daily  . cefTRIAXone (ROCEPHIN) IV  1 g Intravenous Q24H  . clomiPRAMINE  25 mg Oral QID  . diltiazem  240 mg Oral Daily  . docusate sodium  100 mg Oral Daily  . DULoxetine  60 mg Oral Daily  . insulin aspart  0-15 Units Subcutaneous TID WC  . insulin aspart  0-5 Units Subcutaneous QHS  . insulin glargine  30 Units Subcutaneous QHS  . linagliptin  5 mg Oral Daily  . metFORMIN  500 mg Oral BID WC  . mirtazapine  15 mg Oral QHS  . potassium chloride  10 mEq Oral Daily  . sodium chloride      . terbinafine   Topical BID  . warfarin  5 mg Oral q1800  . DISCONTD: clomiPRAMINE  25 mg Oral QID  . DISCONTD: warfarin  5 mg Oral ONCE-1800   Continuous:   . sodium chloride 100 mL/hr at 10/24/10 6213   YQM:[id number], HYDROmorphone (DILAUDID) injection, LORazepam, zolpidem, zolpidem, DISCONTD: zolpidem  Results for orders placed during the  hospital encounter of 10/23/10 (from the past 48 hour(s))  WOUND CULTURE     Status: Normal (Preliminary result)   Collection Time   10/23/10 12:51 PM      Component Value Range Comment   Specimen Description WOUND ABDOMEN      Special Requests NONE      Gram Stain        Value: NO WBC SEEN     MODERATE SQUAMOUS EPITHELIAL CELLS PRESENT     ABUNDANT GRAM NEGATIVE RODS     FEW GRAM POSITIVE COCCI IN PAIRS   Culture NO GROWTH      Report Status PENDING     CBC     Status: Abnormal   Collection Time   10/23/10 12:59 PM      Component Value Range Comment   WBC 14.9 (*) 4.0 - 10.5 (K/uL)    RBC 4.92  3.87 - 5.11 (MIL/uL)    Hemoglobin 14.5  12.0 - 15.0 (g/dL)    HCT 53.6  64.4 - 03.4 (%)    MCV 86.8  78.0 - 100.0 (fL)    MCH 29.5  26.0 - 34.0 (pg)    MCHC 34.0  30.0 - 36.0 (g/dL)    RDW 74.2  59.5 - 63.8 (%)    Platelets 283  150 - 400 (K/uL)   COMPREHENSIVE METABOLIC PANEL     Status: Abnormal   Collection Time   10/23/10 12:59 PM      Component Value Range Comment   Sodium 133 (*) 135 - 145 (mEq/L)    Potassium 4.4  3.5 - 5.1 (mEq/L)    Chloride 95 (*) 96 - 112 (mEq/L)    CO2 27  19 - 32 (mEq/L)    Glucose, Bld 228 (*) 70 - 99 (mg/dL)    BUN 21  6 - 23 (mg/dL)    Creatinine, Ser 7.56  0.50 - 1.10 (mg/dL)    Calcium 9.7  8.4 - 10.5 (mg/dL)    Total Protein 7.3  6.0 - 8.3 (g/dL)    Albumin 3.4 (*) 3.5 - 5.2 (g/dL)    AST 16  0 - 37 (U/L)    ALT 28  0 - 35 (U/L)    Alkaline Phosphatase 96  39 - 117 (U/L)  Total Bilirubin 0.3  0.3 - 1.2 (mg/dL)    GFR calc non Af Amer 55 (*) >60 (mL/min)    GFR calc Af Amer >60  >60 (mL/min)   HEMOGLOBIN A1C     Status: Abnormal   Collection Time   10/23/10 12:59 PM      Component Value Range Comment   Hemoglobin A1C 10.6 (*) <5.7 (%)    Mean Plasma Glucose 258 (*) <117 (mg/dL)   CULTURE, BLOOD (ROUTINE X 2)     Status: Normal (Preliminary result)   Collection Time   10/23/10  1:09 PM      Component Value Range Comment   Specimen  Description BLOOD RIGHT HAND      Special Requests BOTTLES DRAWN AEROBIC AND ANAEROBIC 6CC      Culture NO GROWTH 1 DAY      Report Status PENDING     CULTURE, BLOOD (ROUTINE X 2)     Status: Normal (Preliminary result)   Collection Time   10/23/10  1:30 PM      Component Value Range Comment   Specimen Description BLOOD LEFT HAND      Special Requests BOTTLES DRAWN AEROBIC AND ANAEROBIC 6CC      Culture NO GROWTH 1 DAY      Report Status PENDING     PROTIME-INR     Status: Abnormal   Collection Time   10/23/10  3:04 PM      Component Value Range Comment   Prothrombin Time 25.4 (*) 11.6 - 15.2 (seconds)    INR 2.27 (*) 0.00 - 1.49    GLUCOSE, CAPILLARY     Status: Abnormal   Collection Time   10/23/10  4:17 PM      Component Value Range Comment   Glucose-Capillary 294 (*) 70 - 99 (mg/dL)   GLUCOSE, CAPILLARY     Status: Abnormal   Collection Time   10/23/10  9:00 PM      Component Value Range Comment   Glucose-Capillary 203 (*) 70 - 99 (mg/dL)    Comment 1 Notify RN      Comment 2 Documented in Chart     PROTIME-INR     Status: Abnormal   Collection Time   10/24/10  5:36 AM      Component Value Range Comment   Prothrombin Time 25.5 (*) 11.6 - 15.2 (seconds)    INR 2.28 (*) 0.00 - 1.49    HEPATIC FUNCTION PANEL     Status: Abnormal   Collection Time   10/24/10  7:10 AM      Component Value Range Comment   Total Protein 7.4  6.0 - 8.3 (g/dL)    Albumin 3.1 (*) 3.5 - 5.2 (g/dL)    AST 18  0 - 37 (U/L)    ALT 28  0 - 35 (U/L)    Alkaline Phosphatase 89  39 - 117 (U/L)    Total Bilirubin 0.4  0.3 - 1.2 (mg/dL)    Bilirubin, Direct 0.1  0.0 - 0.3 (mg/dL)    Indirect Bilirubin 0.3  0.3 - 0.9 (mg/dL)   CBC     Status: Normal   Collection Time   10/24/10  7:10 AM      Component Value Range Comment   WBC 10.4  4.0 - 10.5 (K/uL)    RBC 4.92  3.87 - 5.11 (MIL/uL)    Hemoglobin 14.6  12.0 - 15.0 (g/dL)    HCT 56.2  13.0 - 86.5 (%)  MCV 86.2  78.0 - 100.0 (fL)    MCH 29.7  26.0 -  34.0 (pg)    MCHC 34.4  30.0 - 36.0 (g/dL)    RDW 16.1  09.6 - 04.5 (%)    Platelets 246  150 - 400 (K/uL)   DIFFERENTIAL     Status: Abnormal   Collection Time   10/24/10  7:10 AM      Component Value Range Comment   Neutrophils Relative 74  43 - 77 (%)    Neutro Abs 7.7  1.7 - 7.7 (K/uL)    Lymphocytes Relative 13  12 - 46 (%)    Lymphs Abs 1.4  0.7 - 4.0 (K/uL)    Monocytes Relative 10  3 - 12 (%)    Monocytes Absolute 1.1 (*) 0.1 - 1.0 (K/uL)    Eosinophils Relative 2  0 - 5 (%)    Eosinophils Absolute 0.2  0.0 - 0.7 (K/uL)    Basophils Relative 0  0 - 1 (%)    Basophils Absolute 0.0  0.0 - 0.1 (K/uL)   GLUCOSE, CAPILLARY     Status: Abnormal   Collection Time   10/24/10  7:33 AM      Component Value Range Comment   Glucose-Capillary 158 (*) 70 - 99 (mg/dL)     No results found.  Review of Systems  Constitutional: Positive for fever, chills and malaise/fatigue.  HENT: Negative.   Eyes: Negative.   Respiratory: Negative.   Cardiovascular: Negative.   Gastrointestinal: Negative.        Lower down all skin demonstrates frank erythema and weeping.  Genitourinary: Negative.   Musculoskeletal: Negative.   Skin: Negative.   Neurological: Negative.   Endo/Heme/Allergies: Negative.   Psychiatric/Behavioral: Negative.    Blood pressure 153/74, pulse 77, temperature 97.6 F (36.4 C), temperature source Oral, resp. rate 20, height 5\' 8"  (1.727 m), weight 117.3 kg (258 lb 9.6 oz), SpO2 90.00%. Physical Exam  Constitutional: She is oriented to person, place, and time. She appears well-developed and well-nourished.       Morbidly obese  HENT:  Head: Normocephalic and atraumatic.  Eyes: Conjunctivae and EOM are normal. Pupils are equal, round, and reactive to light.  Neck: Normal range of motion. Neck supple.  Cardiovascular: Normal rate, regular rhythm and normal heart sounds.   Respiratory: Effort normal and breath sounds normal. No respiratory distress.  GI: Soft. Bowel  sounds are normal.  Lymphadenopathy:    She has no cervical adenopathy.  Neurological: She is alert and oriented to person, place, and time.  Skin:       Abdominal wall skin in the lower abdomen below her pannus demonstrates significant erythema progressing to bilateral flanks and over the mons pubis. There is no crepitance. There is no induration. The skin is action quite soft and pliable. She is exquisitely tender. There is some serous weeping from the skin. No palpable fluctuance is noted. No lymphadenopathy is noted.    Assessment/Plan: Cellulitis of the lower double wall skin. At this time her evaluation does not suggest any deep subcuticular or fascial involvement. At this point would continue IV antibiotics and actually had Vanco to cover potential MRSA cellulitis. There is no evidence of any Margaretmary Lombard is. Should her disease progress I would recommend at that time and getting a CT of the abdomen and pelvis without contrast to further evaluate the soft tissue involvement. However at this time would continue current management treatment. Would also consider doing warm compresses to  the area. I do not feel any fluctuance or anything suspicious for an underlying abscess. Continue pain control as needed. CT to follow the patient during her course however at this time there is no acute surgical intervention required or suggested.  Kayl Stogdill C 10/24/2010, 11:32 AM

## 2010-10-24 NOTE — Progress Notes (Signed)
UR Chart Review Completed  

## 2010-10-24 NOTE — Progress Notes (Signed)
Inpatient Diabetes Program Recommendations  AACE/ADA: New Consensus Statement on Inpatient Glycemic Control (2009)  Target Ranges:  Prepandial:   less than 140 mg/dL      Peak postprandial:   less than 180 mg/dL (1-2 hours)      Critically ill patients:  140 - 180 mg/dL   Reason for Visit: Elevated HgbA1C  10.6%     Note: Consider Dietician consult while in hospital as well as consulting Dr. Fransico Him to help improve glycemic control.

## 2010-10-24 NOTE — H&P (Signed)
[id number]

## 2010-10-24 NOTE — H&P (Signed)
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  The patient apparently has chronic tinea corporis, treated intermittently with Nizoral cream due to pannus formation in the right groin and belly folds.  The patient came in walking very uncomfortable to the office yesterday.  There was diffuse redness, excoriation, and superimposed cellulitis on top of her usual tinea corporis.  It was felt to put her in for intravenous antibiotics in form of IV Rocephin empirically and aggressive antifungal creams and monitoring of blood glucoses.  She denies any dysuria, frequency, nausea, vomiting, melena, or hematochezia.  She denies anginal chest pain, orthopnea, or PND.  PAST MEDICAL HISTORY:  Significant for the aforementioned hypertension, chronic DVT, anticoagulation, type 2 diabetes insulin dependent, anxiety, and obsessive compulsive disorder as well as GERD and hypertension.  CURRENT MEDICATIONS:  Dictation ended at this point.     Melvyn Novas, MD     RMD/MEDQ  D:  10/24/2010  T:  10/24/2010  Job:  [id number]

## 2010-10-24 NOTE — Consult Note (Signed)
ANTIBIOTIC CONSULT NOTE - INITIAL  Pharmacy Consult for vancomycin  Indication: cellulitis  Allergies  Allergen Reactions  . Compazine Anaphylaxis  . Other Anaphylaxis    Thermasol  . Penicillins Shortness Of Breath and Rash  . Morphine And Related Other (See Comments)    Patient states that it makes her lose her state of mind.    Patient Measurements: Height: 5\' 8"  (172.7 cm) Weight: 258 lb 9.6 oz (117.3 kg) IBW/kg (Calculated) : 63.9   Vital Signs: Temp: 97.6 F (36.4 C) (09/19 0500) Temp src: Oral (09/19 0500) BP: 153/74 mmHg (09/19 0500) Pulse Rate: 77  (09/19 0500) Intake/Output from previous day: 09/18 0701 - 09/19 0700 In: 480 [P.O.:480] Out: -  Intake/Output from this shift: Total I/O In: 2351.7 [P.O.:780; I.V.:1571.7] Out: -   Labs:  Basename 10/24/10 0710 10/23/10 1259  WBC 10.4 14.9*  HGB 14.6 14.5  PLT 246 283  LABCREA -- --  CREATININE -- 0.99  CRCLEARANCE -- --   No results found for this basename: VANCOTROUGH:2,VANCOPEAK:2,VANCORANDOM:2,GENTTROUGH:2,GENTPEAK:2,GENTRANDOM:2,TOBRATROUGH:2,TOBRAPEAK:2,TOBRARND:2,AMIKACINPEAK:2,AMIKACINTROU:2,AMIKACIN:2, in the last 72 hours   Microbiology: Recent Results (from the past 720 hour(s))  WOUND CULTURE     Status: Normal (Preliminary result)   Collection Time   10/23/10 12:51 PM      Component Value Range Status Comment   Specimen Description WOUND ABDOMEN   Final    Special Requests NONE   Final    Gram Stain     Final    Value: NO WBC SEEN     MODERATE SQUAMOUS EPITHELIAL CELLS PRESENT     ABUNDANT GRAM NEGATIVE RODS     FEW GRAM POSITIVE COCCI IN PAIRS   Culture NO GROWTH   Final    Report Status PENDING   Incomplete   CULTURE, BLOOD (ROUTINE X 2)     Status: Normal (Preliminary result)   Collection Time   10/23/10  1:09 PM      Component Value Range Status Comment   Specimen Description BLOOD RIGHT HAND   Final    Special Requests BOTTLES DRAWN AEROBIC AND ANAEROBIC 6CC   Final    Culture NO  GROWTH 1 DAY   Final    Report Status PENDING   Incomplete   CULTURE, BLOOD (ROUTINE X 2)     Status: Normal (Preliminary result)   Collection Time   10/23/10  1:30 PM      Component Value Range Status Comment   Specimen Description BLOOD LEFT HAND   Final    Special Requests BOTTLES DRAWN AEROBIC AND ANAEROBIC 6CC   Final    Culture NO GROWTH 1 DAY   Final    Report Status PENDING   Incomplete    Medical History: Past Medical History  Diagnosis Date  . Diabetes mellitus   . DVT (deep venous thrombosis)   . Neuropathy   . Narcolepsy    Medications:  Scheduled:    . aspirin  81 mg Oral Daily  . cefTRIAXone (ROCEPHIN) IV  1 g Intravenous Q24H  . clomiPRAMINE  25 mg Oral QID  . diltiazem  240 mg Oral Daily  . docusate sodium  100 mg Oral Daily  . DULoxetine  60 mg Oral Daily  . insulin aspart  0-15 Units Subcutaneous TID WC  . insulin aspart  0-5 Units Subcutaneous QHS  . insulin glargine  30 Units Subcutaneous QHS  . linagliptin  5 mg Oral Daily  . metFORMIN  500 mg Oral BID WC  . mirtazapine  15 mg  Oral QHS  . potassium chloride  10 mEq Oral Daily  . sodium chloride      . terbinafine   Topical BID  . vancomycin  1,000 mg Intravenous NOW  . vancomycin  1,000 mg Intravenous Q12H  . warfarin  5 mg Oral q1800  . DISCONTD: clomiPRAMINE  25 mg Oral QID  . DISCONTD: warfarin  5 mg Oral ONCE-1800   Assessment: Obese Good renal fxn  Goal of Therapy:  Vancomycin trough level 10-15 mcg/ml Eradicate infection.  Plan: Vancomycin 1gm iv q12hrs Check trough at steady state monitor renal fxn  Rebecca [name] A 10/24/2010,11:46 AM

## 2010-10-24 NOTE — H&P (Signed)
[id number] 

## 2010-10-25 LAB — TSH: TSH: 1.121 u[IU]/mL (ref 0.350–4.500)

## 2010-10-25 LAB — GLUCOSE, CAPILLARY: Glucose-Capillary: 229 mg/dL — ABNORMAL HIGH (ref 70–99)

## 2010-10-25 LAB — BASIC METABOLIC PANEL
BUN: 12 mg/dL (ref 6–23)
CO2: 29 mEq/L (ref 19–32)
Chloride: 103 mEq/L (ref 96–112)
Creatinine, Ser: 0.66 mg/dL (ref 0.50–1.10)

## 2010-10-25 NOTE — Progress Notes (Signed)
[id number] 

## 2010-10-25 NOTE — Consult Note (Signed)
ANTIBIOTIC CONSULT NOTE   Pharmacy Consult for vancomycin  Indication: cellulitis  Allergies  Allergen Reactions  . Compazine Anaphylaxis  . Other Anaphylaxis    Thermasol  . Penicillins Shortness Of Breath and Rash  . Morphine And Related Other (See Comments)    Patient states that it makes her lose her state of mind.    Patient Measurements: Height: 5\' 8"  (172.7 cm) Weight: 258 lb 9.6 oz (117.3 kg) IBW/kg (Calculated) : 63.9   Vital Signs: Temp: 97.8 F (36.6 C) (09/20 0556) Temp src: Oral (09/20 0556) BP: 153/84 mmHg (09/20 0556) Pulse Rate: 78  (09/20 0556) Intake/Output from previous day: 09/19 0701 - 09/20 0700 In: 2971.7 [P.O.:1400; I.V.:1571.7] Out: 1 [Urine:1] Intake/Output from this shift:    Labs:  Basename 10/25/10 0531 10/24/10 0710 10/23/10 1259  WBC -- 10.4 14.9*  HGB -- 14.6 14.5  PLT -- 246 283  LABCREA -- -- --  CREATININE 0.66 -- 0.99  CRCLEARANCE -- -- --   No results found for this basename: VANCOTROUGH:2,VANCOPEAK:2,VANCORANDOM:2,GENTTROUGH:2,GENTPEAK:2,GENTRANDOM:2,TOBRATROUGH:2,TOBRAPEAK:2,TOBRARND:2,AMIKACINPEAK:2,AMIKACINTROU:2,AMIKACIN:2, in the last 72 hours   Microbiology: Recent Results (from the past 720 hour(s))  WOUND CULTURE     Status: Normal (Preliminary result)   Collection Time   10/23/10 12:51 PM      Component Value Range Status Comment   Specimen Description WOUND ABDOMEN   Final    Special Requests NONE   Final    Gram Stain     Final    Value: NO WBC SEEN     MODERATE SQUAMOUS EPITHELIAL CELLS PRESENT     ABUNDANT GRAM NEGATIVE RODS     FEW GRAM POSITIVE COCCI IN PAIRS   Culture MODERATE GRAM NEGATIVE RODS   Final    Report Status PENDING   Incomplete   CULTURE, BLOOD (ROUTINE X 2)     Status: Normal (Preliminary result)   Collection Time   10/23/10  1:09 PM      Component Value Range Status Comment   Specimen Description BLOOD RIGHT HAND   Final    Special Requests BOTTLES DRAWN AEROBIC AND ANAEROBIC 6CC    Final    Culture NO GROWTH 1 DAY   Final    Report Status PENDING   Incomplete   CULTURE, BLOOD (ROUTINE X 2)     Status: Normal (Preliminary result)   Collection Time   10/23/10  1:30 PM      Component Value Range Status Comment   Specimen Description BLOOD LEFT HAND   Final    Special Requests BOTTLES DRAWN AEROBIC AND ANAEROBIC 6CC   Final    Culture NO GROWTH 1 DAY   Final    Report Status PENDING   Incomplete    Medical History: Past Medical History  Diagnosis Date  . Diabetes mellitus   . DVT (deep venous thrombosis)   . Neuropathy   . Narcolepsy    Medications:  Scheduled:     . aspirin  81 mg Oral Daily  . cefTRIAXone (ROCEPHIN) IV  1 g Intravenous Q24H  . clomiPRAMINE  25 mg Oral QID  . diltiazem  240 mg Oral Daily  . docusate sodium  100 mg Oral Daily  . DULoxetine  60 mg Oral Daily  . insulin aspart  0-15 Units Subcutaneous TID WC  . insulin aspart  0-5 Units Subcutaneous QHS  . insulin glargine  30 Units Subcutaneous QHS  . linagliptin  5 mg Oral Daily  . metFORMIN  500 mg Oral BID WC  .  mirtazapine  15 mg Oral QHS  . potassium chloride  10 mEq Oral Daily  . terbinafine   Topical BID  . vancomycin  1,000 mg Intravenous NOW  . vancomycin  1,000 mg Intravenous Q12H  . warfarin  5 mg Oral q1800   Assessment: Obese Good renal fxn & stable INR therapeutic (MD managing Coumadin)  Goal of Therapy:  Vancomycin trough level 10-15 mcg/ml Eradicate infection.  Plan: Vancomycin 1gm iv q12hrs Check trough Friday am monitor renal fxn  [name] A 10/25/2010,8:22 AM

## 2010-10-25 NOTE — Progress Notes (Signed)
  Subjective: Pain somewhat better.  No fevers.  No chills.  Objective: Vital signs in last 24 hours: Temp:  [97.3 F (36.3 C)-98.1 F (36.7 C)] 97.3 F (36.3 C) (09/20 1400) Pulse Rate:  [73-93] 73  (09/20 1400) Resp:  [20-22] 22  (09/20 1400) BP: (146-160)/(75-84) 146/75 mmHg (09/20 1400) SpO2:  [96 %-98 %] 98 % (09/20 1400) Last BM Date: 10/22/10  Intake/Output from previous day: 09/19 0701 - 09/20 0700 In: 2971.7 [P.O.:1400; I.V.:1571.7] Out: 1 [Urine:1] Intake/Output this shift: Total I/O In: 1000 [P.O.:1000] Out: -   Skin: abd wall still with frank erythema.  Somewhat mascerated.  No purulence.  No edema.  Skin soft.  Redness into mons pubis but no palp abscess.  Lab Results:  @LABLAST2 (wbc:2,hgb:2,hct:2,plt:2) BMET  Basename 10/25/10 0531 10/23/10 1259  NA 138 133*  K 4.7 4.4  CL 103 95*  CO2 29 27  GLUCOSE 140* 228*  BUN 12 21  CREATININE 0.66 0.99  CALCIUM 9.1 9.7   PT/INR  Basename 10/25/10 0531 10/24/10 0536  LABPROT 25.3* 25.5*  INR 2.26* 2.28*   ABG No results found for this basename: PHART:2,PCO2:2,PO2:2,HCO3:2 in the last 72 hours  Studies/Results: No results found.  Anti-infectives: Anti-infectives     Start     Dose/Rate Route Frequency Ordered Stop   10/24/10 1200   vancomycin (VANCOCIN) IVPB 1000 mg/200 mL premix        1,000 mg 200 mL/hr over 60 Minutes Intravenous NOW 10/24/10 1146 10/24/10 1406          Assessment/Plan: s/p  Continue abx for cellulitis.  No evidence of progression.  Consider nystatin powder to area.  LOS: 2 days    Lynsay Fesperman C 10/25/2010

## 2010-10-25 NOTE — Progress Notes (Signed)
Rebecca [name], REASONS NO.:  [id number]  MEDICAL RECORD NO.:  [id number]  LOCATION:  A324                          FACILITY:  [facility]  PHYSICIAN:  Melvyn Novas, MDDATE OF BIRTH:  08-19-1940  DATE OF PROCEDURE: DATE OF DISCHARGE:                                PROGRESS NOTE   Problems are as follows: 1. Double layered cellulitis, lower abdominal wall and mons pubis. 2. Possible superimposed yeast infection, initially begetting this. 3. Insulin-dependent diabetes. 4. Adiposity. 5. Anticoagulation for chronic deep vein thrombosis. 6. Hypertension. 7. Hyperlipidemia. 8. Anxiety. 9. Obsessive-compulsive disorder. 10.Insomnia.  Appreciate surgical consultation with Dr. Leticia Penna.  Today's temperature 97.8, blood pressure 153/84, respiratory rate is 20, pulse is 78 and regular.  NEW LABS:  White count down to 10.4 from 15, hemoglobin 14.6, potassium 4.7, BUN and creatinine is 12.66, INR is 2.27 on Coumadin 5 mg per day therapeutic.  Rocephin was started initially.  Vancomycin added to cover possible MRSA in hospital.  PHYSICAL EXAMINATION:  LUNGS:  Clear. HEART:  Regular rhythm.  No murmurs, gallops, or rubs.  The patient has exquisite tenderness, erythema, and rubor throughout the lower abdomen and inguinal area.  Plan right now is continue with opioid analgesia, continue Rocephin and vancomycin.  Blood cultures revealed no growth x2 after 24 hours.  The patient is somewhat symptomatically improving and require intravenous Plavix for several days and depending on clinical course, we will consider CAT scan.     Melvyn Novas, MD     RMD/MEDQ  D:  10/25/2010  T:  10/25/2010  Job:  [id number]

## 2010-10-25 NOTE — Progress Notes (Signed)
Inpatient Diabetes Program Recommendations  AACE/ADA: New Consensus Statement on Inpatient Glycemic Control (2009)  Target Ranges:  Prepandial:   less than 140 mg/dL      Peak postprandial:   less than 180 mg/dL (1-2 hours)      Critically ill patients:  140 - 180 mg/dL   Reason for Visit: Elevated prandial glucose:  158, 212, 201, 223 mg/dL  Inpatient Diabetes Program Recommendations Insulin - Meal Coverage: Add Novolog 3 units TID for CHO coverage

## 2010-10-26 ENCOUNTER — Encounter (HOSPITAL_COMMUNITY): Payer: Self-pay | Admitting: *Deleted

## 2010-10-26 LAB — GLUCOSE, CAPILLARY: Glucose-Capillary: 138 mg/dL — ABNORMAL HIGH (ref 70–99)

## 2010-10-26 LAB — BASIC METABOLIC PANEL
BUN: 9 mg/dL (ref 6–23)
GFR calc Af Amer: >60 mL/min (ref >60)
GFR calc non Af Amer: >60 mL/min (ref >60)
Potassium: 4.3 mEq/L (ref 3.5–5.1)
Sodium: 138 mEq/L (ref 135–145)

## 2010-10-26 MED ORDER — SODIUM CHLORIDE 0.9 % IV SOLN
1500.0000 mg | Freq: Two times a day (BID) | INTRAVENOUS | Status: DC
  Administered 2010-10-26 – 2010-10-27 (×2): 1500 mg via INTRAVENOUS
  Filled 2010-10-26 (×6): qty 1500

## 2010-10-26 MED ORDER — BISACODYL 5 MG PO TBEC
10.0000 mg | DELAYED_RELEASE_TABLET | Freq: Every day | ORAL | Status: DC | PRN
  Administered 2010-10-27 – 2010-10-28 (×2): 10 mg via ORAL
  Filled 2010-10-26: qty 2
  Filled 2010-10-26: qty 1

## 2010-10-26 MED ORDER — VANCOMYCIN HCL 1000 MG IV SOLR
1500.0000 mg | Freq: Two times a day (BID) | INTRAVENOUS | Status: DC
  Administered 2010-10-26: 1500 mg via INTRAVENOUS
  Filled 2010-10-26 (×7): qty 1500

## 2010-10-26 MED ORDER — DEXTROSE 5 % IV SOLN
1.0000 g | INTRAVENOUS | Status: DC
  Administered 2010-10-27 – 2010-10-29 (×3): 1 g via INTRAVENOUS
  Filled 2010-10-26 (×5): qty 1

## 2010-10-26 MED ORDER — SODIUM CHLORIDE 0.9 % IJ SOLN
INTRAMUSCULAR | Status: AC
  Filled 2010-10-26: qty 10

## 2010-10-26 MED ORDER — CEFTRIAXONE SODIUM 1 G IJ SOLR
1.0000 g | INTRAMUSCULAR | Status: DC
  Administered 2010-10-26: 1 g via INTRAMUSCULAR
  Filled 2010-10-26 (×4): qty 1

## 2010-10-26 MED ORDER — VANCOMYCIN 50 MG/ML ORAL SOLUTION: 1500.0000 mg | Freq: Two times a day (BID) | ORAL | Status: DC

## 2010-10-26 MED ORDER — BENAZEPRIL HCL 10 MG PO TABS
20.0000 mg | ORAL_TABLET | Freq: Every day | ORAL | Status: DC
  Administered 2010-10-26 – 2010-10-30 (×5): 20 mg via ORAL
  Filled 2010-10-26: qty 2
  Filled 2010-10-26 (×2): qty 1
  Filled 2010-10-26 (×3): qty 2

## 2010-10-26 NOTE — Consult Note (Signed)
ANTIBIOTIC CONSULT NOTE   Pharmacy Consult for vancomycin  Indication: cellulitis  Allergies  Allergen Reactions  . Compazine Anaphylaxis  . Other Anaphylaxis    Thermasol  . Penicillins Shortness Of Breath and Rash  . Morphine And Related Other (See Comments)    Patient states that it makes her lose her state of mind.    Patient Measurements: Height: 5\' 8"  (172.7 cm) Weight: 258 lb 9.6 oz (117.3 kg) IBW/kg (Calculated) : 63.9   Vital Signs: Temp: 97.9 F (36.6 C) (09/21 0600) Temp src: Oral (09/21 0600) BP: 179/85 mmHg (09/21 0600) Pulse Rate: 76  (09/21 0600) Intake/Output from previous day: 09/20 0701 - 09/21 0700 In: 1480 [P.O.:1480] Out: -  Intake/Output from this shift:    Labs:  Basename 10/26/10 0537 10/25/10 0531 10/24/10 0710 10/23/10 1259  WBC -- -- 10.4 14.9*  HGB -- -- 14.6 14.5  PLT -- -- 246 283  LABCREA -- -- -- --  CREATININE 0.69 0.66 -- 0.99  CRCLEARANCE -- -- -- --    Basename 10/26/10 0911  VANCOTROUGH <5.0*  VANCOPEAK --  Drue Dun --  GENTTROUGH --  GENTPEAK --  GENTRANDOM --  TOBRATROUGH --  Nolen Mu --  TOBRARND --  AMIKACINPEAK --  AMIKACINTROU --  AMIKACIN --     Microbiology: Recent Results (from the past 720 hour(s))  WOUND CULTURE     Status: Normal (Preliminary result)   Collection Time   10/23/10 12:51 PM      Component Value Range Status Comment   Specimen Description WOUND ABDOMEN   Final    Special Requests NONE   Final    Gram Stain     Final    Value: NO WBC SEEN     MODERATE SQUAMOUS EPITHELIAL CELLS PRESENT     ABUNDANT GRAM NEGATIVE RODS     FEW GRAM POSITIVE COCCI IN PAIRS   Culture MODERATE GRAM NEGATIVE RODS   Final    Report Status PENDING   Incomplete   CULTURE, BLOOD (ROUTINE X 2)     Status: Normal (Preliminary result)   Collection Time   10/23/10  1:09 PM      Component Value Range Status Comment   Specimen Description BLOOD RIGHT HAND   Final    Special Requests BOTTLES DRAWN AEROBIC AND  ANAEROBIC 6CC   Final    Culture NO GROWTH 3 DAYS   Final    Report Status PENDING   Incomplete   CULTURE, BLOOD (ROUTINE X 2)     Status: Normal (Preliminary result)   Collection Time   10/23/10  1:30 PM      Component Value Range Status Comment   Specimen Description BLOOD LEFT HAND   Final    Special Requests BOTTLES DRAWN AEROBIC AND ANAEROBIC 6CC   Final    Culture NO GROWTH 3 DAYS   Final    Report Status PENDING   Incomplete    Medical History: Past Medical History  Diagnosis Date  . Diabetes mellitus   . DVT (deep venous thrombosis)   . Neuropathy   . Narcolepsy    Medications:  Scheduled:     . aspirin  81 mg Oral Daily  . cefTRIAXone (ROCEPHIN) IV  1 g Intravenous Q24H  . clomiPRAMINE  25 mg Oral QID  . diltiazem  240 mg Oral Daily  . docusate sodium  100 mg Oral Daily  . DULoxetine  60 mg Oral Daily  . insulin aspart  0-15 Units Subcutaneous TID WC  .  insulin aspart  0-5 Units Subcutaneous QHS  . insulin glargine  30 Units Subcutaneous QHS  . linagliptin  5 mg Oral Daily  . metFORMIN  500 mg Oral BID WC  . mirtazapine  15 mg Oral QHS  . potassium chloride  10 mEq Oral Daily  . terbinafine   Topical BID  . vancomycin  1,500 mg Intravenous Q12H  . warfarin  5 mg Oral q1800  . DISCONTD: vancomycin  1,000 mg Intravenous Q12H   Assessment: Vancomycin trough sub-therapeutic Good renal fxn & stable INR therapeutic (MD managing Coumadin)  Goal of Therapy:  Vancomycin trough level 10-15 mcg/ml Eradicate infection.  Plan: Vancomycin 1.5gm iv q12hrs Check trough Saturday am monitor renal fxn  Gilman Buttner, [place] J 10/26/2010,10:40 AM

## 2010-10-26 NOTE — Progress Notes (Signed)
[id number] 

## 2010-10-26 NOTE — Progress Notes (Signed)
Rebecca [name], Rebecca [name] NO.:  [id number]  MEDICAL RECORD NO.:  [id number]  LOCATION:  A324                          FACILITY:  [facility]  PHYSICIAN:  Melvyn Novas, MDDATE OF BIRTH:  12/26/1940  DATE OF PROCEDURE: DATE OF DISCHARGE:                                PROGRESS NOTE   PROBLEMS:  Are as follows: 1. Diffuse cellulitis of abdomen. 2. Tinea corpus of perineum and lower abdominal area under the pannus. 3. Hypertension. 4. Hyperlipidemia. 5. Anticoagulation for chronic deep venous thrombosis. 6. Obsessive-compulsive disorder. 7. Anxiety and insomnia.  NEW LABS:  BMET essentially within normal limits with BUN of 9, creatinine of 0.69, INR of 2.26 on Coumadin 5 mg daily which is stable. White count of September 18 was 14.9, subsequently September 19 decreased to 10.4.  The patient has less tenderness of abdomen and less erythema.  No induration.  No evidence of abscess formation.  PHYSICAL EXAMINATION:  VITAL SIGNS:  Temperature 97.9, blood pressure elevated today 179/85, pulse is 76 and regular, respiratory rate is 20, O2 sat is 96%. GENERAL:  Alert and oriented. NECK:  No JVD. LUNGS:  Clear to A and P.  No rales, wheeze or rhonchi. HEART:  Regular rhythm.  No murmurs, gallops or rubs. ABDOMEN:  As described.  PLAN:  We will continue Lamisil cream, vancomycin and Rocephin.  Blood cultures negative x2.  Skin cultures, wound cultures revealed proteus mirabilis white cells which is sensitive to Rocephin which is prescribed.  Plan right now is to continue current therapy with topical as well as intravenous antibiotics.  We will monitor blood pressure and institute increased antihypertensive control if the hypertension remains uncontrolled.    Melvyn Novas, MD    RMD/MEDQ  D:  10/26/2010  T:  10/26/2010  Job:  [id number]

## 2010-10-26 NOTE — Progress Notes (Signed)
  Subjective: Feels somewhat better.  No fevers.  Less burning.   Objective: Vital signs in last 24 hours: Temp:  [97.3 F (36.3 C)-97.9 F (36.6 C)] 97.9 F (36.6 C) (09/21 0600) Pulse Rate:  [73-106] 76  (09/21 0600) Resp:  [20-22] 20  (09/21 0600) BP: (146-179)/(75-85) 179/85 mmHg (09/21 0600) SpO2:  [94 %-98 %] 96 % (09/21 0600) Last BM Date: 10/22/10  Intake/Output from previous day: 09/20 0701 - 09/21 0700 In: 1480 [P.O.:1480] Out: -  Intake/Output this shift:    General appearance: alert and no distress Skin: abd wall erythema is decreased.  Skin remains soft and no evidence of deeper tissue edema.  Lab Results:  @LABLAST2 (wbc:2,hgb:2,hct:2,plt:2) BMET  Basename 10/26/10 0537 10/25/10 0531  NA 138 138  K 4.3 4.7  CL 103 103  CO2 26 29  GLUCOSE 171* 140*  BUN 9 12  CREATININE 0.69 0.66  CALCIUM 8.8 9.1   PT/INR  Basename 10/25/10 0531 10/24/10 0536  LABPROT 25.3* 25.5*  INR 2.26* 2.28*   ABG No results found for this basename: PHART:2,PCO2:2,PO2:2,HCO3:2 in the last 72 hours  Studies/Results: No results found.  Anti-infectives: Anti-infectives     Start     Dose/Rate Route Frequency Ordered Stop   10/24/10 2200   vancomycin (VANCOCIN) IVPB 1000 mg/200 mL premix  Status:  Discontinued        1,000 mg 200 mL/hr over 60 Minutes Intravenous Every 12 hours 10/24/10 1146 10/26/10 1037   10/24/10 1200   vancomycin (VANCOCIN) IVPB 1000 mg/200 mL premix        1,000 mg 200 mL/hr over 60 Minutes Intravenous NOW 10/24/10 1146 10/24/10 1406          Assessment/Plan: s/p  Cellulitis.  Continue abx and local care.  No abscess.  No acute surgical indications.  Will follow more peripherally.  LOS: 3 days    Jase Reep C 10/26/2010

## 2010-10-27 LAB — BASIC METABOLIC PANEL
CO2: 26 mEq/L (ref 19–32)
Calcium: 8.9 mg/dL (ref 8.4–10.5)
GFR calc Af Amer: >60 mL/min (ref >60)
Sodium: 138 mEq/L (ref 135–145)

## 2010-10-27 LAB — PROTIME-INR
INR: 2.28 — ABNORMAL HIGH (ref 0.00–1.49)
Prothrombin Time: 25.5 seconds — ABNORMAL HIGH (ref 11.6–15.2)

## 2010-10-27 LAB — GLUCOSE, CAPILLARY
Glucose-Capillary: 162 mg/dL — ABNORMAL HIGH (ref 70–99)
Glucose-Capillary: 193 mg/dL — ABNORMAL HIGH (ref 70–99)

## 2010-10-27 MED ORDER — VANCOMYCIN HCL 1000 MG IV SOLR
1250.0000 mg | Freq: Two times a day (BID) | INTRAVENOUS | Status: DC
  Administered 2010-10-27 – 2010-10-30 (×4): 1250 mg via INTRAVENOUS
  Filled 2010-10-27 (×8): qty 1250

## 2010-10-27 MED ORDER — SODIUM CHLORIDE 0.9 % IJ SOLN
INTRAMUSCULAR | Status: AC
  Filled 2010-10-27: qty 10

## 2010-10-27 MED ORDER — ONDANSETRON HCL 4 MG/2ML IJ SOLN
4.0000 mg | Freq: Four times a day (QID) | INTRAMUSCULAR | Status: DC | PRN
  Administered 2010-10-27: 4 mg via INTRAVENOUS
  Filled 2010-10-27: qty 2

## 2010-10-27 NOTE — Progress Notes (Signed)
[id number] 

## 2010-10-27 NOTE — Consult Note (Signed)
ANTIBIOTIC CONSULT NOTE   Pharmacy Consult for vancomycin  Indication: cellulitis  Allergies  Allergen Reactions  . Compazine Anaphylaxis  . Other Anaphylaxis    Thermasol  . Penicillins Shortness Of Breath and Rash  . Morphine And Related Other (See Comments)    Patient states that it makes her lose her state of mind.    Patient Measurements: Height: 5\' 8"  (172.7 cm) Weight: 258 lb 9.6 oz (117.3 kg) IBW/kg (Calculated) : 63.9   Vital Signs: Temp: 97.9 F (36.6 C) (09/22 0600) Temp src: Oral (09/22 0600) BP: 151/80 mmHg (09/22 0600) Pulse Rate: 78  (09/22 0600) Intake/Output from previous day: 09/21 0701 - 09/22 0700 In: 8030 [P.O.:1060; I.V.:6970] Out: -  Intake/Output from this shift:    Labs:  Basename 10/27/10 0611 10/26/10 0537 10/25/10 0531  WBC -- -- --  HGB -- -- --  PLT -- -- --  LABCREA -- -- --  CREATININE 0.69 0.69 0.66  CRCLEARANCE -- -- --    Alvira Philips 10/27/10 0933 10/26/10 0911  VANCOTROUGH 20.0 <5.0*  VANCOPEAK -- --  Drue Dun -- --  GENTTROUGH -- --  GENTPEAK -- --  GENTRANDOM -- --  TOBRATROUGH -- --  TOBRAPEAK -- --  TOBRARND -- --  AMIKACINPEAK -- --  AMIKACINTROU -- --  AMIKACIN -- --     Microbiology: Recent Results (from the past 720 hour(s))  WOUND CULTURE     Status: Normal (Preliminary result)   Collection Time   10/23/10 12:51 PM      Component Value Range Status Comment   Specimen Description WOUND ABDOMEN   Final    Special Requests NONE   Final    Gram Stain     Final    Value: NO WBC SEEN     MODERATE SQUAMOUS EPITHELIAL CELLS PRESENT     ABUNDANT GRAM NEGATIVE RODS     FEW GRAM POSITIVE COCCI IN PAIRS   Culture MODERATE PROTEUS MIRABILIS   Final    Report Status PENDING   Incomplete    Organism ID, Bacteria PROTEUS MIRABILIS   Final   CULTURE, BLOOD (ROUTINE X 2)     Status: Normal (Preliminary result)   Collection Time   10/23/10  1:09 PM      Component Value Range Status Comment   Specimen Description  BLOOD RIGHT HAND   Final    Special Requests BOTTLES DRAWN AEROBIC AND ANAEROBIC 6CC   Final    Culture NO GROWTH 4 DAYS   Final    Report Status PENDING   Incomplete   CULTURE, BLOOD (ROUTINE X 2)     Status: Normal (Preliminary result)   Collection Time   10/23/10  1:30 PM      Component Value Range Status Comment   Specimen Description BLOOD LEFT HAND   Final    Special Requests BOTTLES DRAWN AEROBIC AND ANAEROBIC 6CC   Final    Culture NO GROWTH 4 DAYS   Final    Report Status PENDING   Incomplete    Medical History: Past Medical History  Diagnosis Date  . Diabetes mellitus   . DVT (deep venous thrombosis)   . Neuropathy   . Narcolepsy    Medications:  Scheduled:     . aspirin  81 mg Oral Daily  . benazepril  20 mg Oral Daily  . cefTRIAXone (ROCEPHIN) IV  1 g Intravenous Q24H  . clomiPRAMINE  25 mg Oral QID  . diltiazem  240 mg Oral Daily  .  docusate sodium  100 mg Oral Daily  . DULoxetine  60 mg Oral Daily  . insulin aspart  0-15 Units Subcutaneous TID WC  . insulin aspart  0-5 Units Subcutaneous QHS  . insulin glargine  30 Units Subcutaneous QHS  . linagliptin  5 mg Oral Daily  . metFORMIN  500 mg Oral BID WC  . mirtazapine  15 mg Oral QHS  . potassium chloride  10 mEq Oral Daily  . sodium chloride      . sodium chloride      . terbinafine   Topical BID  . vancomycin  1,250 mg Intravenous Q12H  . warfarin  5 mg Oral q1800  . DISCONTD: cefTRIAXone (ROCEPHIN) IV  1 g Intravenous Q24H  . DISCONTD: cefTRIAXone  1 g Intramuscular Q24H  . DISCONTD: vancomycin  1,500 mg Intravenous Q12H  . DISCONTD: vancomycin  1,500 mg Intravenous Q12H  . DISCONTD: vancomycin  1,500 mg Oral Q12H   Assessment: Vancomycin trough therapeutic Good renal fxn & stable INR therapeutic (MD managing Coumadin)  Goal of Therapy:  Vancomycin trough level 10-15 mcg/ml Eradicate infection.  Plan: Vancomycin 1250mg  iv q12hrs  Continue Warfarin 5mg  daily monitor renal fxn  Caryl Asp 10/27/2010,11:33 AM

## 2010-10-28 LAB — CULTURE, BLOOD (ROUTINE X 2): Culture: NO GROWTH

## 2010-10-28 LAB — WOUND CULTURE: Gram Stain: NONE SEEN

## 2010-10-28 LAB — GLUCOSE, CAPILLARY: Glucose-Capillary: 213 mg/dL — ABNORMAL HIGH (ref 70–99)

## 2010-10-28 LAB — BASIC METABOLIC PANEL
CO2: 23 mEq/L (ref 19–32)
Calcium: 8.9 mg/dL (ref 8.4–10.5)
Chloride: 101 mEq/L (ref 96–112)
Creatinine, Ser: 0.79 mg/dL (ref 0.50–1.10)
Glucose, Bld: 145 mg/dL — ABNORMAL HIGH (ref 70–99)

## 2010-10-28 NOTE — Progress Notes (Signed)
NAMEJADALYNN, Rebecca [name] NO.:  [id number]  MEDICAL RECORD NO.:  [id number]  LOCATION:  A324                          FACILITY:  [facility]  PHYSICIAN:  Melvyn Novas, MDDATE OF BIRTH:  18-Dec-1940  DATE OF PROCEDURE: DATE OF DISCHARGE:                                PROGRESS NOTE   The patient has diffuse abdominal cellulitis, which seems to be improving on vancomycin and Rocephin.  Has tinea cruris, tinea corporis, insulin-dependent diabetes, anticoagulation for chronic DVT, hyperlipidemia, anxiety, OCD, hypertension.  PHYSICAL EXAMINATION:  VITAL SIGNS:  Blood pressure is suboptimally controlled today 158/75, temperature 98.1, pulse is 74 and regular, respirations are 20, O2 sat is 91%. LUNGS:  Clear.  No rales, wheezes, or rhonchi appreciable. HEART:  Regular rhythm.  No murmurs, gallops, heaves, or rubs. ABDOMEN:  Soft.  Mild diffuse tenderness underneath the pannus and abdomen.  This seems to be somewhat improving.  Less erythema than previous.  LABORATORY DATA:  BMET done yesterday is essentially within normal limits.  BUN 12, creatinine 0.6.  INR is 2.39 today on Coumadin 5 mg p.o. daily.  PLAN:  Right now is to continue dual ACE and calcium channel blockade and we will consider clonidine for better blood pressure control. Continue ceftriaxone and vancomycin IV, Lamisil cream b.i.d. to all affected areas, and continue insulin in the form of Lantus 30 units subcu nightly and before meals and nightly glucoses daily as well as potassium 10 mEq per day and metformin 500 p.o. b.i.d.     Melvyn Novas, MD     RMD/MEDQ  D:  10/28/2010  T:  10/28/2010  Job:  [id number]

## 2010-10-28 NOTE — Progress Notes (Signed)
[id number] 

## 2010-10-28 NOTE — Progress Notes (Signed)
Rebecca [name], STONG NO.:  [id number]  MEDICAL RECORD NO.:  [id number]  LOCATION:  A324                          FACILITY:  [facility]  PHYSICIAN:  Melvyn Novas, MDDATE OF BIRTH:  18-Nov-1940  DATE OF PROCEDURE: DATE OF DISCHARGE:                                PROGRESS NOTE   The patient is undergoing dual antibiotic therapy for abdominal cellulitis to the pannus and tinea cruris, has chronic anticoagulation for chronic DVT, hypertension, hyperlipidemia, insulin-dependent diabetes, anxiety disorder, OCD.  Blood pressure today has somewhat improved to 151/80 with the addition of Lotensin.  Yesterday, temperature 97.9, pulse 78 and regular, respiratory rate 20.  The patient is afebrile.  Lungs are clear.  No rales.  No wheeze.  Abdominal erythema and rubor is less pronounced, still somewhat tender.  No abdominal crepitus.  No evidence of abscess formation.  Plan right now is to continue vancomycin and Rocephin.  She will rule out urinary micro sensitive to Rocephin only.  Physical therapy to ambulate the patient, continue Coumadin 5 mg, monitoring PT/INR, and we will hopefully discharge in 2 days' time.  Also, her BUN and creatinine are 12 and 0.69, potassium 4.5.     Melvyn Novas, MD     RMD/MEDQ  D:  10/27/2010  T:  10/27/2010  Job:  [id number]

## 2010-10-29 ENCOUNTER — Encounter (HOSPITAL_COMMUNITY): Payer: PRIVATE HEALTH INSURANCE

## 2010-10-29 ENCOUNTER — Inpatient Hospital Stay (HOSPITAL_COMMUNITY): Payer: Medicare Other

## 2010-10-29 LAB — HEPATIC FUNCTION PANEL
ALT: 52 U/L — ABNORMAL HIGH (ref 0–35)
AST: 23 U/L (ref 0–37)
Albumin: 2.9 g/dL — ABNORMAL LOW (ref 3.5–5.2)
Bilirubin, Direct: <0.1 mg/dL (ref 0.0–0.3)
Total Bilirubin: 0.2 mg/dL — ABNORMAL LOW (ref 0.3–1.2)

## 2010-10-29 LAB — GLUCOSE, CAPILLARY
Glucose-Capillary: 292 mg/dL — ABNORMAL HIGH (ref 70–99)
Glucose-Capillary: 243 mg/dL — ABNORMAL HIGH (ref 70–99)
Glucose-Capillary: 192 mg/dL — ABNORMAL HIGH (ref 70–99)

## 2010-10-29 LAB — PROTIME-INR: INR: 2.38 — ABNORMAL HIGH (ref 0.00–1.49)

## 2010-10-29 MED ORDER — SODIUM CHLORIDE 0.9 % IJ SOLN
10.0000 mL | INTRAMUSCULAR | Status: DC | PRN
  Administered 2010-10-30: 10 mL
  Filled 2010-10-29: qty 10

## 2010-10-29 MED ORDER — HYDROCHLOROTHIAZIDE 12.5 MG PO CAPS
12.5000 mg | ORAL_CAPSULE | Freq: Every day | ORAL | Status: DC
  Administered 2010-10-29 – 2010-10-30 (×2): 12.5 mg via ORAL
  Filled 2010-10-29 (×2): qty 1

## 2010-10-29 MED ORDER — SODIUM CHLORIDE 0.9 % IJ SOLN
10.0000 mL | Freq: Two times a day (BID) | INTRAMUSCULAR | Status: DC
  Administered 2010-10-29: 10 mL
  Filled 2010-10-29 (×2): qty 10

## 2010-10-29 MED ORDER — CLONIDINE HCL 0.1 MG PO TABS
0.1000 mg | ORAL_TABLET | Freq: Two times a day (BID) | ORAL | Status: DC
  Administered 2010-10-29 – 2010-10-30 (×3): 0.1 mg via ORAL
  Filled 2010-10-29 (×3): qty 1

## 2010-10-29 NOTE — Progress Notes (Signed)
[name], Rebecca NO.:  [id number]  MEDICAL RECORD NO.:  [id number]  LOCATION:  A324                          FACILITY:  [facility]  PHYSICIAN:  Melvyn Novas, MDDATE OF BIRTH:  May 08, 1940  DATE OF PROCEDURE: DATE OF DISCHARGE:                                PROGRESS NOTE   Problems are diffuse cellulitis of abdominal wall, tinea cruris, obesity, hyperlipidemia, type 2 diabetes, uncontrolled hypertension. Blood pressure this morning was 174/92, on benazepril 20 and Cardizem CD 240.  We will add clonidine 0.1 mg p.o. b.i.d. today and HCTZ 12.5.  The patient was anticoagulated due to chronic DVT on Coumadin.  INR 2.38, BUN 11, creatinine 7.79.  NECK:  Shows no JVD. LUNGS:  Clear to A and P.  No rales, wheeze or rhonchi. HEART:  Regular rhythm.  No murmurs, gallops or rubs. ABDOMEN:  Diffuse erythema and rubor.  No induration noted.  Plan right now is to add clonidine and HCTZ.  Continue vancomycin and Rocephin.  We will consider outpatient administration of home antibiotics IV with home health care.     Melvyn Novas, MD     RMD/MEDQ  D:  10/29/2010  T:  10/29/2010  Job:  [id number]

## 2010-10-29 NOTE — Progress Notes (Signed)
[id number] 

## 2010-10-30 LAB — HEPATIC FUNCTION PANEL
ALT: 48 U/L — ABNORMAL HIGH (ref 0–35)
AST: 25 U/L (ref 0–37)
Albumin: 3 g/dL — ABNORMAL LOW (ref 3.5–5.2)
Alkaline Phosphatase: 91 U/L (ref 39–117)
Total Bilirubin: 0.3 mg/dL (ref 0.3–1.2)
Total Protein: 6.7 g/dL (ref 6.0–8.3)

## 2010-10-30 LAB — GLUCOSE, CAPILLARY: Glucose-Capillary: 198 mg/dL — ABNORMAL HIGH (ref 70–99)

## 2010-10-30 MED ORDER — CLONIDINE HCL 0.1 MG PO TABS: 0.1000 mg | ORAL_TABLET | Freq: Two times a day (BID) | ORAL | Status: DC

## 2010-10-30 MED ORDER — SODIUM CHLORIDE 0.9 % IJ SOLN: 10.0000 mL | Freq: Two times a day (BID) | INTRAMUSCULAR | Status: DC

## 2010-10-30 MED ORDER — DILTIAZEM HCL ER COATED BEADS 180 MG PO CP24: 360.0000 mg | ORAL_CAPSULE | Freq: Every day | ORAL | Status: DC

## 2010-10-30 MED ORDER — LINAGLIPTIN 5 MG PO TABS: 5.0000 mg | ORAL_TABLET | Freq: Every day | ORAL | Status: DC

## 2010-10-30 MED ORDER — VANCOMYCIN HCL 1000 MG IV SOLR: 1250.0000 mg | INTRAVENOUS | Status: DC

## 2010-10-30 MED ORDER — TERBINAFINE HCL 1 % EX CREA: TOPICAL_CREAM | Freq: Two times a day (BID) | CUTANEOUS | Status: DC

## 2010-10-30 MED ORDER — ASPIRIN 81 MG PO CHEW: 81.0000 mg | CHEWABLE_TABLET | Freq: Every day | ORAL | Status: DC

## 2010-10-30 MED ORDER — DSS 100 MG PO CAPS: 100.0000 mg | ORAL_CAPSULE | Freq: Every day | ORAL | Status: AC

## 2010-10-30 NOTE — Progress Notes (Signed)
Physical Therapy Evaluation Patient Name: Rebecca [name] [id number] Date: 10/30/2010 Problem List: There is no problem list on file for this patient. Time: 810-830 Charges: 1 eval Past Medical History:  Past Medical History  Diagnosis Date  . Diabetes mellitus   . DVT (deep venous thrombosis)   . Neuropathy   . Narcolepsy    Past Surgical History: History reviewed. No pertinent past surgical history.  Precautions/Restrictions    Prior Functioning  Home Living Type of Home: House Lives With: Alone Receives Help From: Family (son is her neighbor) Home Layout: One level Home Access: Ramped entrance Bathroom Shower/Tub: Health visitor: Handicapped height Bathroom Accessibility: Yes Home Adaptive Equipment: Bedside commode/3-in-1;Grab bars around toilet;Grab bars in shower;Walker - rolling;Straight cane Prior Function Level of Independence: Requires assistive device for independence;Independent with basic ADLs Driving: Yes Vocation: On disability Cognition Cognition Arousal/Alertness: Awake/alert Overall Cognitive Status: Appears within functional limits for tasks assessed Sensation/Coordination   Extremity Assessment RUE Assessment RUE Assessment: Within Functional Limits LUE Assessment LUE Assessment: Within Functional Limits RLE Assessment RLE Assessment: Within Functional Limits LLE Assessment LLE Assessment: Within Functional Limits Mobility (including Balance) Bed Mobility Bed Mobility: Yes Supine to Sit: 5: Supervision Supine to Sit Details (indicate cue type and reason): cueing for sequencing Sitting - Scoot to Edge of Bed: 6: Modified independent (Device/Increase time) Transfers Transfers: Yes Sit to Stand: 6: Modified independent (Device/Increase time) Stand to Sit: 6: Modified independent (Device/Increase time) Ambulation/Gait Ambulation/Gait: Yes Ambulation/Gait Assistance: 6: Modified independent (Device/Increase time) Ambulation Distance  (Feet): 40 Feet Assistive device: Rolling walker Gait Pattern: Step-through pattern Gait velocity: Decreased Stairs: No Wheelchair Mobility Wheelchair Mobility: No  Posture/Postural Control Posture/Postural Control: No significant limitations Exercise  Total Joint Exercises Long Arc Quad: AROM;Both;5 reps General Exercises - Lower Extremity Long Arc Quad: AROM;Both;5 reps Toe Raises: AROM;5 reps Heel Raises: AROM;5 reps  End of Session PT - End of Session Equipment Utilized During Treatment: Gait belt Activity Tolerance: Patient tolerated treatment well Patient left: in chair Nurse Communication: Mobility status for transfers;Mobility status for ambulation General Behavior During Session: [facility] for tasks performed Cognition: [facility] for tasks performed PT Assessment/Plan/Recommendation PT Assessment Clinical Impression Statement: Pt referred to PT secondary to generalized weakness.  Pt has current impairments including decreased activity tolerance and will benefit from skilled PT services to address above impairments to maximize independence PT Recommendation/Assessment: Patient will need skilled PT in the acute care venue PT Problem List: Decreased activity tolerance PT Therapy Diagnosis : Difficulty walking PT Plan PT Treatment/Interventions: Gait training;Functional mobility training;Therapeutic exercise;Patient/family education PT Recommendation Follow Up Recommendations: Home health PT Equipment Recommended: None recommended by PT PT Goals  Acute Rehab PT Goals PT Goal Formulation: With patient Pt will Ambulate: >150 feet;with modified independence Rebecca [name] 10/30/2010, 8:35 AM

## 2010-10-30 NOTE — Discharge Summary (Signed)
[id number] 

## 2010-10-30 NOTE — Progress Notes (Signed)
Pt given d/c instruction, pt verbalized understanding of instructions. Pt d/c home with family in stable condition

## 2010-10-30 NOTE — Discharge Summary (Signed)
NAMEMANDISA, PERSINGER NO.:  [id number]  MEDICAL RECORD NO.:  [id number]  LOCATION:  A324                          FACILITY:  [facility]  PHYSICIAN:  Melvyn Novas, MDDATE OF BIRTH:  January 31, 1941  DATE OF ADMISSION:  10/23/2010 DATE OF DISCHARGE:  LH                              DISCHARGE SUMMARY   The patient is a 70 year old white female with a long history of hyperlipidemia, insulin-dependent diabetes, hypertension, hyperlipidemia, anxiety disorder, OCD, obstructive sleep apnea who was also anticoagulated on Coumadin for chronic DVTs which are recurrent. The patient was admitted with diffuse cellulitis of the abdomen, lower perineal area under her pannus.  She has obesity.  This was extremely tender, erythematous, not quite indurated.  She was felt to be fungal in origin with skin fissures and subsequent bacterial infection.  She was placed immediately on IV Rocephin and subsequently seen in surgical consultation and had vancomycin added to her regimen as well as Lamisil cream to the entire area.  Over a 5 to 6-day period, this area of erythema seemed to subside, became less tender; however, was still quite erythematous.  There was no evidence of abscess formation.  She was hemodynamically stable in hospital, however, blood pressure was somewhat elevated.  We had added clonidine 0.1 mg p.o. b.i.d. to her medical regimen with subsequent normalization of blood pressures.  She had fair glycemic control.  She was feeling more comfortable and was subsequently discharge with home health care to undergo 10 more days of IV vancomycin therapy with a PICC line.  DISCHARGE MEDICATIONS: 1. Aspirin 81 mg per day. 2. Clonidine 0.1 mg p.o. b.i.d. 3. Colace 100 mg p.o. daily. 4. Trajenta 5 mg p.o. daily. 5. Sodium chloride injection for PICC line daily, no q. 12. 6. Vancomycin 1000 mg solution to be given per pharmacy protocol. 7. Lamisil cream 1% b.i.d. to entire  abdomen and perineal area. 8. Diltiazem 180 mg 2 capsules p.o. b.i.d., total of 360 per day. 9. Benazepril/hydrochlorothiazide 20/25 p.o. daily. 10.Celebrex 200 mg p.o. daily. 11.Anafranil 25 mg p.o. q.i.d. 12.Cymbalta 60 mg p.o. daily. 13.Omega-3 fish oil 1000 mg p.o. b.i.d. 14.Lantus insulin 34 units subcu at bedtime. 15.Metformin 500 mg p.o. b.i.d. 16.Remeron 15 mg p.o. at bedtime. 17.Prilosec 20 mg p.o. daily. 18.Multivitamins 1 a day. 19.Potassium chloride 10 mEq p.o. daily. 20.Primidone 50 mg p.o. daily, 21.Onglyza 5 mg p.o. daily. 22.Coumadin 5 mg p.o. daily. 23.Ambien 10 mg p.o. at bedtime p.r.n. for sleep.  The patient will follow up in my office for assessing renal function, pro-times and checking clinical resolution of cellulitis of lower abdomen.     Melvyn Novas, MD     RMD/MEDQ  D:  10/30/2010  T:  10/30/2010  Job:  [id number]

## 2011-01-31 ENCOUNTER — Other Ambulatory Visit: Payer: Self-pay | Admitting: Family Medicine

## 2011-01-31 DIAGNOSIS — Z1231 Encounter for screening mammogram for malignant neoplasm of breast: Secondary | ICD-10-CM

## 2011-02-25 ENCOUNTER — Ambulatory Visit
Admission: RE | Admit: 2011-02-25 | Discharge: 2011-02-25 | Disposition: A | Discharge status: Home / Self Care | Payer: PRIVATE HEALTH INSURANCE | Source: Ambulatory Visit | Attending: Family Medicine | Admitting: Family Medicine

## 2011-02-25 DIAGNOSIS — Z1231 Encounter for screening mammogram for malignant neoplasm of breast: Secondary | ICD-10-CM

## 2011-05-15 ENCOUNTER — Telehealth (INDEPENDENT_AMBULATORY_CARE_PROVIDER_SITE_OTHER): Payer: Self-pay | Admitting: *Deleted

## 2011-05-15 MED ORDER — OMEPRAZOLE 20 MG PO CPDR
20.0000 mg | DELAYED_RELEASE_CAPSULE | Freq: Two times a day (BID) | ORAL | Status: DC
Start: 2011-05-15 — End: 2011-07-08

## 2011-05-15 NOTE — Telephone Encounter (Signed)
[facility] has requested a refill on Omeprazole 20 mg capsule, take 1 by mouth twice daily.

## 2011-07-08 ENCOUNTER — Ambulatory Visit (INDEPENDENT_AMBULATORY_CARE_PROVIDER_SITE_OTHER): Payer: PRIVATE HEALTH INSURANCE | Admitting: Internal Medicine

## 2011-07-08 ENCOUNTER — Encounter (INDEPENDENT_AMBULATORY_CARE_PROVIDER_SITE_OTHER): Payer: Self-pay | Admitting: Internal Medicine

## 2011-07-08 VITALS — BP 118/70 | HR 76 | Temp 97.1°F | Resp 20 | Ht 68.0 in | Wt 253.0 lb

## 2011-07-08 DIAGNOSIS — K7689 Other specified diseases of liver: Secondary | ICD-10-CM

## 2011-07-08 DIAGNOSIS — K219 Gastro-esophageal reflux disease without esophagitis: Secondary | ICD-10-CM | POA: Insufficient documentation

## 2011-07-08 DIAGNOSIS — E669 Obesity, unspecified: Secondary | ICD-10-CM

## 2011-07-08 DIAGNOSIS — Z86010 Personal history of colonic polyps: Secondary | ICD-10-CM | POA: Insufficient documentation

## 2011-07-08 DIAGNOSIS — D509 Iron deficiency anemia, unspecified: Secondary | ICD-10-CM | POA: Insufficient documentation

## 2011-07-08 DIAGNOSIS — G47419 Narcolepsy without cataplexy: Secondary | ICD-10-CM | POA: Insufficient documentation

## 2011-07-08 DIAGNOSIS — K760 Fatty (change of) liver, not elsewhere classified: Secondary | ICD-10-CM | POA: Insufficient documentation

## 2011-07-08 DIAGNOSIS — K3184 Gastroparesis: Secondary | ICD-10-CM | POA: Insufficient documentation

## 2011-07-08 MED ORDER — OMEPRAZOLE 20 MG PO CPDR: 20.0000 mg | DELAYED_RELEASE_CAPSULE | Freq: Two times a day (BID) | ORAL | Status: DC

## 2011-07-08 NOTE — Patient Instructions (Signed)
Use celecoxib only when you need it; New prescription for omeprazole sent your pharmacy. Physician will contact you after you have your blood work from Dr. Delbert Harness' office.

## 2011-07-08 NOTE — Progress Notes (Signed)
Presenting complaint;  Followup for gastroparesis, iron deficiency anemia and colonic polyps. Subjective:  Rebecca [name] is a 71 year old Caucasian female who is here for scheduled visit. She was last seen in December 2011. She has been off domperidone for 9 months. She feels it helped her a great deal and she also did not experience any side effects. She wonders if she can take metoclopramide. She is staying on a bland diet. She generally has 5 small meals. She stays away from foods that have caused vomiting in the past such as uncooked broccoli. She does regurgitate and vomit if she stoops forward. She has occasional heartburn and denies dysphagia. Her bowels move daily. She denies frank rectal bleeding or melena. Only time she sees blood has been she has rectal prolapse. She is on probiotic which is not listed in her medications. She states this helps keep her bowels regular. She continues to have difficulty with equilibrium and uses walker and/or cane when ambulating. She uses Celebrex normal than once every 3 or 4 days. She is interested in having colonoscopy since her last exam was in May 2007 but she is concerned she would not be able to tolerate the prep.  Current Medications: Current Outpatient Prescriptions  Medication Sig Dispense Refill  . benazepril-hydrochlorthiazide (LOTENSIN HCT) 20-25 MG per tablet Take 1 tablet by mouth daily.        . celecoxib (CELEBREX) 200 MG capsule Take 200 mg by mouth daily as needed. For arthritis      . clomiPRAMINE (ANAFRANIL) 25 MG capsule Take 25 mg by mouth at bedtime.        . cloNIDine (CATAPRES) 0.1 MG tablet Take 1 tablet (0.1 mg total) by mouth 2 (two) times daily.  60 tablet  3  . diltiazem (CARDIZEM CD) 180 MG 24 hr capsule Take 2 capsules (360 mg total) by mouth daily.  30 capsule  3  . DULoxetine (CYMBALTA) 60 MG capsule Take 60 mg by mouth daily.        . fish oil-omega-3 fatty acids 1000 MG capsule Take 1 g by mouth daily.        . insulin  glargine (LANTUS) 100 UNIT/ML injection Inject 36 Units into the skin at bedtime.       Marland Kitchen linagliptin (TRADJENTA) 5 MG TABS tablet Take 1 tablet (5 mg total) by mouth daily.  30 tablet  3  . metFORMIN (GLUCOPHAGE) 500 MG tablet Take 500 mg by mouth 2 (two) times daily with a meal.        . mirtazapine (REMERON) 15 MG tablet Take 15 mg by mouth at bedtime.        . Multiple Vitamins-Minerals (MULTIVITAMIN WITH MINERALS) tablet Take 1 tablet by mouth daily.        Marland Kitchen omeprazole (PRILOSEC) 20 MG capsule Take 1 capsule (20 mg total) by mouth 2 (two) times daily.  60 capsule  5  . potassium chloride (MICRO-K) 10 MEQ CR capsule Take 10 mEq by mouth 4 (four) times daily.       Marland Kitchen PRESCRIPTION MEDICATION Take 1 tablet by mouth daily. Ferrocet Iron Tablet       . primidone (MYSOLINE) 50 MG tablet Take 50 mg by mouth daily.        . saxagliptin HCl (ONGLYZA) 5 MG TABS tablet Take 5 mg by mouth daily.        Marland Kitchen terbinafine (LAMISIL) 1 % cream Apply topically 2 (two) times daily.  42 g  5  . warfarin (COUMADIN)  5 MG tablet Take 5 mg by mouth daily.       Marland Kitchen zolpidem (AMBIEN) 10 MG tablet Take 10 mg by mouth at bedtime.        Marland Kitchen aspirin 81 MG chewable tablet Chew 1 tablet (81 mg total) by mouth daily.  30 tablet  4  . sodium chloride 0.9 % injection 10 mLs by Intracatheter route every 12 (twelve) hours.  5 mL  60  . sodium chloride 0.9 % SOLN 250 mL with vancomycin 1000 MG SOLR 1,250 mg Inject 1,250 mg into the vein daily.  10 ampule  1     Objective: Blood pressure 118/70, pulse 76, temperature 97.1 F (36.2 C), temperature source Oral, resp. rate 20, height 5\' 8"  (1.727 m), weight 253 lb (114.76 kg). She is alert and does not have asterixis. Conjunctiva is pink. Sclera is nonicteric Oropharyngeal mucosa is normal. No neck masses or thyromegaly noted. Cardiac exam with regular rhythm normal S1 and S2. No murmur or gallop noted. Lungs are clear to auscultation. Abdomen is full but soft and nontender  without organomegaly or masses. Rectal examination deferred  No LE edema or clubbing noted. Multiple bruises noted over upper extremities.  Labs/studies Results: Lab data from 03/20/2011. Bilirubin 0.83 AP 78 AST 21 ALT 28 albumin 4.3 glucose 175 electrolytes are normal BUN 18 creatinine 0.83 A1c 9.5. No CBC results noted.   Assessment:  #1. Diabetic gastroparesis. She appears to be doing reasonably well with dietary measures and double dose PPI. She is not a candidate for metoclopramide given underlying neurologic problems. #2. History of iron deficiency anemia. Need to check CBC. #4. History of colonic adenomas. Last exam was in May 2007. Patient undecided whether or not she wants to proceed with colonoscopy. #5. Non-alcoholic fatty liver disease. EGD in October 2011 revealed grade 1 esophageal varices. Recent transaminases are normal. Her liver disease may become an issue in future with poorly controlled diabetes and obesity.    Plan:  CBC. New prescription for omeprazole 20 mg by mouth twice a day send her pharmacy. Colonoscopy will be scheduled and she is ready. Office visit in one year

## 2011-07-10 ENCOUNTER — Encounter (INDEPENDENT_AMBULATORY_CARE_PROVIDER_SITE_OTHER): Payer: Self-pay

## 2011-07-16 LAB — CBC
HCT: 42.9 % (ref 36.0–46.0)
Hemoglobin: 14.6 g/dL (ref 12.0–15.0)
MCH: 29.6 pg (ref 26.0–34.0)
MCHC: 34 g/dL (ref 30.0–36.0)
MCV: 87 fL (ref 78.0–100.0)

## 2011-10-21 ENCOUNTER — Ambulatory Visit (HOSPITAL_COMMUNITY)
Admission: RE | Admit: 2011-10-21 | Discharge: 2011-10-21 | Disposition: A | Discharge status: Home / Self Care | Payer: PRIVATE HEALTH INSURANCE | Source: Ambulatory Visit | Attending: Family Medicine | Admitting: Family Medicine

## 2011-10-21 ENCOUNTER — Other Ambulatory Visit (HOSPITAL_COMMUNITY): Payer: Self-pay | Admitting: Family Medicine

## 2011-10-21 DIAGNOSIS — M19049 Primary osteoarthritis, unspecified hand: Secondary | ICD-10-CM | POA: Insufficient documentation

## 2011-10-21 DIAGNOSIS — T148XXA Other injury of unspecified body region, initial encounter: Secondary | ICD-10-CM

## 2011-10-21 DIAGNOSIS — R937 Abnormal findings on diagnostic imaging of other parts of musculoskeletal system: Secondary | ICD-10-CM | POA: Insufficient documentation

## 2011-10-21 DIAGNOSIS — M79609 Pain in unspecified limb: Secondary | ICD-10-CM | POA: Insufficient documentation

## 2011-10-21 DIAGNOSIS — M25539 Pain in unspecified wrist: Secondary | ICD-10-CM | POA: Insufficient documentation

## 2011-10-30 ENCOUNTER — Encounter: Payer: Self-pay | Admitting: Orthopedic Surgery

## 2011-10-30 ENCOUNTER — Ambulatory Visit (INDEPENDENT_AMBULATORY_CARE_PROVIDER_SITE_OTHER): Payer: PRIVATE HEALTH INSURANCE | Admitting: Orthopedic Surgery

## 2011-10-30 VITALS — BP 120/70 | Ht 68.0 in | Wt 253.0 lb

## 2011-10-30 DIAGNOSIS — M12549 Traumatic arthropathy, unspecified hand: Secondary | ICD-10-CM

## 2011-10-30 DIAGNOSIS — M19139 Post-traumatic osteoarthritis, unspecified wrist: Secondary | ICD-10-CM | POA: Insufficient documentation

## 2011-10-30 MED ORDER — HYDROCODONE-ACETAMINOPHEN 7.5-325 MG PO TABS: 1.0000 | ORAL_TABLET | Freq: Four times a day (QID) | ORAL | Status: DC | PRN

## 2011-10-30 NOTE — Patient Instructions (Addendum)
Brace x 6 weeks   Continue norco 7.5

## 2011-10-30 NOTE — Progress Notes (Signed)
Patient ID: Rebecca [name], female   DOB: 01/24/1941, 71 y.o.   MRN: [id number] Chief Complaint  Patient presents with  . Wrist Pain    right wrist and hand pain, sudden onset 10/06/11, (had CTR >61yrs ago)    Referral by Dr. Delbert Harness    This is a 71 year old female status post carpal tunnel release and dorsal ganglion excision presented to the doctor with acute onset of pain in her left wrist and hand are rarely over the dorsum of the wrist. Pain was unrelieved by 5 mg hydrocodone and splinting which was not tolerated. She took 7.5 mg of hydrocodone and got partial relief but still complains of 8/10 sharp throbbing stabbing pain on the dorsum of the wrist she denies any numbness or tingling she's having difficulty moving her wrist and hand.  Her review of systems is positive for history of vomiting numbness and tingling in unsteady gait unrelated to her hand, easy bleeding and bruising. Otherwise all other systems were reviewed and were normal  Past Medical History  Diagnosis Date  . Diabetes mellitus   . DVT (deep venous thrombosis)   . Neuropathy   . Narcolepsy   . Cataplexy   . GERD (gastroesophageal reflux disease)   . Hiatal hernia     Past Surgical History  Procedure Date  . Abdominal hysterectomy   . Hemorroidectomy   . Carpal tunnel release   . Cholecystectomy   . Kidney surgery   . Total hip arthroplasty     bilateral  BP 120/70  Ht 5\' 8"  (1.727 m)  Wt 114.76 kg (253 lb)  BMI 38.47 kg/m2 The patient is slightly obese but otherwise normally developed with normal grooming and hygiene. She is oriented x3. Her mood and affect are normal. She ambulates without noticeable limp    Right Hand Exam   Tenderness  The patient is experiencing tenderness in the dorsal area.  Range of Motion   Wrist  Extension: abnormal  Flexion: abnormal  Pronation: abnormal  Supination: abnormal   Muscle Strength  Wrist Extension: 4/5  Wrist Flexion: 4/5  Grip: 4/5   Tests    Phalen's Sign: negative Tinel's Sign (Medial Nerve): negative Finkelstein: negative  Other  Erythema: absent Scars: absent Sensation: normal Pulse: present  Comments:  Primarily having painful wrist range of motion in all planes. She has decreased strength in the wrist in terms of flexion extension. Tenderness directly over a previously noted incision for dorsal wrist ganglion which is located over the wrist joint.       x-rays were done at the hospital she does have a widened scapholunate interval. The stage is probably 2  Scapholunate advanced collapse  Splint for 6 weeks refill medication hydrocodone 7.5 mg  If no improvement refer to hand surgery probably Dr. Teressa Senter

## 2011-11-14 ENCOUNTER — Telehealth: Payer: Self-pay | Admitting: Orthopedic Surgery

## 2011-11-14 NOTE — Telephone Encounter (Signed)
Patient called, stopped by office this afternoon, requesting records.  States has gone ahead and called hand specialist, Dr. Teressa Senter, which Dr. Romeo Apple had noted in her visit note.  She states she already has an appointment with Dr. Teressa Senter for tomorrow, 11/15/11, 11:00am.    She signed the appropriate medical authorization release form, and picked up records.  She will pick up films from [facility] as well. She requested that we cancel the upcoming follow up appointment in our office for 12/11/11, which I have done per her request.

## 2011-12-11 ENCOUNTER — Ambulatory Visit: Payer: PRIVATE HEALTH INSURANCE | Admitting: Orthopedic Surgery

## 2011-12-24 ENCOUNTER — Encounter (HOSPITAL_BASED_OUTPATIENT_CLINIC_OR_DEPARTMENT_OTHER): Payer: Self-pay | Admitting: *Deleted

## 2011-12-24 ENCOUNTER — Other Ambulatory Visit: Payer: Self-pay | Admitting: Orthopedic Surgery

## 2011-12-24 NOTE — Progress Notes (Signed)
Pt only stopped coumadin 3 days-to come in day before surgery to ck pt ptt-bmet- To bring all meds overnight bag Denies sleep apnea-has narcolepsy Falls a lot.

## 2011-12-25 ENCOUNTER — Encounter (HOSPITAL_BASED_OUTPATIENT_CLINIC_OR_DEPARTMENT_OTHER)
Admission: RE | Admit: 2011-12-25 | Discharge: 2011-12-25 | Disposition: A | Discharge status: Home / Self Care | Payer: PRIVATE HEALTH INSURANCE | Source: Ambulatory Visit | Attending: Orthopedic Surgery | Admitting: Orthopedic Surgery

## 2011-12-25 LAB — BASIC METABOLIC PANEL
CO2: 25 mEq/L (ref 19–32)
Calcium: 9.4 mg/dL (ref 8.4–10.5)
Creatinine, Ser: 0.8 mg/dL (ref 0.50–1.10)
GFR calc Af Amer: 83 mL/min — ABNORMAL LOW (ref >90)
GFR calc non Af Amer: 72 mL/min — ABNORMAL LOW (ref >90)
Sodium: 138 mEq/L (ref 135–145)

## 2011-12-25 LAB — PROTIME-INR
INR: 1.27 (ref 0.00–1.49)
Prothrombin Time: 15.6 seconds — ABNORMAL HIGH (ref 11.6–15.2)

## 2011-12-25 NOTE — H&P (Addendum)
Rebecca [name] is an 71 y.o. female.   Chief Complaint: c/o chronic and progressive right wrist pain ZOX:WRUE Rebecca [name] returns with severe and disabling pain of her right wrist.  Rebecca [name] has had a number of falls.  She has been ambulatory with a cane on the right side.  She recently saw Dr. Valentina Shaggy for evaluation of her wrist and was noted to have dissociative carpal instability with a VISI position of the lunate. Dr. Romeo Apple recommended a wrist forearm splint and follow-up with our practice.  I have cared for Rebecca [name] in the past with bilateral carpal tunnel release surgery and resection of an epidermal cyst.     Past Medical History  Diagnosis Date  . Diabetes mellitus   . DVT (deep venous thrombosis)   . Neuropathy   . Narcolepsy   . Cataplexy   . GERD (gastroesophageal reflux disease)   . Hiatal hernia   . Hypertension   . Shortness of breath     Past Surgical History  Procedure Date  . Abdominal hysterectomy   . Hemorroidectomy   . Carpal tunnel release   . Cholecystectomy   . Kidney surgery   . Total hip arthroplasty     bilateral  . Nephrostomy     Family History  Problem Relation Age of Onset  . Heart disease    . Diabetes     Social History:  reports that she quit smoking about 23 years ago. She has never used smokeless tobacco. She reports that she does not drink alcohol or use illicit drugs.  Allergies:  Allergies  Allergen Reactions  . Compazine Anaphylaxis  . Other Anaphylaxis    Thermasol  . Penicillins Shortness Of Breath and Rash  . Morphine And Related Other (See Comments)    Patient states that it makes her lose her state of mind.     No prescriptions prior to admission    Results for orders placed during the hospital encounter of 12/26/11 (from the past 48 hour(s))  BASIC METABOLIC PANEL     Status: Abnormal   Collection Time   12/25/11  2:00 PM      Component Value Range Comment   Sodium 138  135 - 145 mEq/L    Potassium 4.6  3.5 - 5.1 mEq/L     Chloride 100  96 - 112 mEq/L    CO2 25  19 - 32 mEq/L    Glucose, Bld 158 (*) 70 - 99 mg/dL    BUN 18  6 - 23 mg/dL    Creatinine, Ser 4.54  0.50 - 1.10 mg/dL    Calcium 9.4  8.4 - 09.8 mg/dL    GFR calc non Af Amer 72 (*) >90 mL/min    GFR calc Af Amer 83 (*) >90 mL/min   PROTIME-INR     Status: Abnormal   Collection Time   12/25/11  2:00 PM      Component Value Range Comment   Prothrombin Time 15.6 (*) 11.6 - 15.2 seconds    INR 1.27  0.00 - 1.49   APTT     Status: Normal   Collection Time   12/25/11  2:00 PM      Component Value Range Comment   aPTT 35  24 - 37 seconds     No results found.   Pertinent items are noted in HPI.  Height 5\' 8"  (1.727 m), weight 114.76 kg (253 lb).  General appearance: alert Head: Normocephalic, without obvious abnormality Neck: supple, symmetrical,  trachea midline Resp: clear to auscultation bilaterally Cardio: regular rate and rhythm GI: normal findings: bowel sounds normal Extremities: .  She has some senile purpura noted.   She has full range of motion of her fingers and thumb on the right despite Heberden's and Bouchard's nodes.  She has palmar flexion of 70 degrees limited by pain, dorsiflexion 50 degrees limited by significant pain.  She has pain with radial and ulnar deviation on the right. She has full motion of her left wrist.  Pulse and cap refill are intact bilaterally.   Four views of her right and left hands including the carpus are obtained. 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 On the right she has a dissociative carpal instability with scapholunate widening and a VISI posture of her lunate.   Pulses: 2+ and symmetric Skin: normal Neurologic: Grossly normal    Assessment/Plan Impression::   Dissociative carpal instability, right wrist  Plan:She would be a proper candidate for proximal row carpectomy and anterior interosseous and posterior interosseous  neurectomy of the right wrist.The procedure, risks,benefits and post-op course were discussed with the patient at length and they were in agreement with the plan.      DASNOIT,Rebecca [name] 12/25/2011, 7:50 PM

## 2011-12-25 NOTE — Progress Notes (Signed)
Called dr don Hanley Hays in [place] for ekg. Left message to seen ekg in am stat.  Office closed at present.  All other request from his office on chart.

## 2011-12-26 ENCOUNTER — Encounter (HOSPITAL_BASED_OUTPATIENT_CLINIC_OR_DEPARTMENT_OTHER)
Admission: RE | Disposition: A | Discharge status: Home / Self Care | Payer: Self-pay | Source: Ambulatory Visit | Attending: Orthopedic Surgery

## 2011-12-26 ENCOUNTER — Encounter (HOSPITAL_BASED_OUTPATIENT_CLINIC_OR_DEPARTMENT_OTHER): Payer: Self-pay | Admitting: Certified Registered Nurse Anesthetist

## 2011-12-26 ENCOUNTER — Ambulatory Visit (HOSPITAL_BASED_OUTPATIENT_CLINIC_OR_DEPARTMENT_OTHER): Payer: PRIVATE HEALTH INSURANCE | Admitting: Certified Registered Nurse Anesthetist

## 2011-12-26 ENCOUNTER — Ambulatory Visit (HOSPITAL_BASED_OUTPATIENT_CLINIC_OR_DEPARTMENT_OTHER)
Admission: RE | Admit: 2011-12-26 | Discharge: 2011-12-27 | Disposition: A | Discharge status: Home / Self Care | Payer: PRIVATE HEALTH INSURANCE | Source: Ambulatory Visit | Attending: Orthopedic Surgery | Admitting: Orthopedic Surgery

## 2011-12-26 ENCOUNTER — Encounter (HOSPITAL_BASED_OUTPATIENT_CLINIC_OR_DEPARTMENT_OTHER): Payer: Self-pay

## 2011-12-26 DIAGNOSIS — K449 Diaphragmatic hernia without obstruction or gangrene: Secondary | ICD-10-CM | POA: Insufficient documentation

## 2011-12-26 DIAGNOSIS — K3184 Gastroparesis: Secondary | ICD-10-CM

## 2011-12-26 DIAGNOSIS — G589 Mononeuropathy, unspecified: Secondary | ICD-10-CM | POA: Insufficient documentation

## 2011-12-26 DIAGNOSIS — G47419 Narcolepsy without cataplexy: Secondary | ICD-10-CM | POA: Insufficient documentation

## 2011-12-26 DIAGNOSIS — IMO0002 Reserved for concepts with insufficient information to code with codable children: Secondary | ICD-10-CM | POA: Insufficient documentation

## 2011-12-26 DIAGNOSIS — Z833 Family history of diabetes mellitus: Secondary | ICD-10-CM | POA: Insufficient documentation

## 2011-12-26 DIAGNOSIS — E119 Type 2 diabetes mellitus without complications: Secondary | ICD-10-CM | POA: Insufficient documentation

## 2011-12-26 DIAGNOSIS — I10 Essential (primary) hypertension: Secondary | ICD-10-CM | POA: Insufficient documentation

## 2011-12-26 DIAGNOSIS — Z86718 Personal history of other venous thrombosis and embolism: Secondary | ICD-10-CM | POA: Insufficient documentation

## 2011-12-26 DIAGNOSIS — M19039 Primary osteoarthritis, unspecified wrist: Secondary | ICD-10-CM | POA: Insufficient documentation

## 2011-12-26 DIAGNOSIS — Z8249 Family history of ischemic heart disease and other diseases of the circulatory system: Secondary | ICD-10-CM | POA: Insufficient documentation

## 2011-12-26 DIAGNOSIS — Z96649 Presence of unspecified artificial hip joint: Secondary | ICD-10-CM | POA: Insufficient documentation

## 2011-12-26 DIAGNOSIS — K219 Gastro-esophageal reflux disease without esophagitis: Secondary | ICD-10-CM | POA: Insufficient documentation

## 2011-12-26 HISTORY — PX: CARPECTOMY: SHX5004

## 2011-12-26 SURGERY — CARPECTOMY
Anesthesia: General | Site: Wrist | Laterality: Right | Wound class: Clean

## 2011-12-26 MED ORDER — CLOMIPRAMINE HCL 25 MG PO CAPS
25.0000 mg | ORAL_CAPSULE | Freq: Four times a day (QID) | ORAL | Status: DC
  Administered 2011-12-26: 25 mg via ORAL
  Filled 2011-12-26: qty 1

## 2011-12-26 MED ORDER — LIDOCAINE HCL (CARDIAC) 20 MG/ML IV SOLN
INTRAVENOUS | Status: DC | PRN
  Administered 2011-12-26: 30 mg via INTRAVENOUS

## 2011-12-26 MED ORDER — DEXAMETHASONE SODIUM PHOSPHATE 10 MG/ML IJ SOLN
INTRAMUSCULAR | Status: DC | PRN
  Administered 2011-12-26: 4 mg via INTRAVENOUS

## 2011-12-26 MED ORDER — PROPOFOL 10 MG/ML IV BOLUS
INTRAVENOUS | Status: DC | PRN
  Administered 2011-12-26: 120 mg via INTRAVENOUS

## 2011-12-26 MED ORDER — MIDAZOLAM HCL 2 MG/2ML IJ SOLN
1.0000 mg | INTRAMUSCULAR | Status: DC | PRN
  Administered 2011-12-26: 1 mg via INTRAVENOUS

## 2011-12-26 MED ORDER — FENTANYL CITRATE 0.05 MG/ML IJ SOLN: 25.0000 ug | INTRAMUSCULAR | Status: DC | PRN

## 2011-12-26 MED ORDER — VANCOMYCIN HCL IN DEXTROSE 1-5 GM/200ML-% IV SOLN
1000.0000 mg | INTRAVENOUS | Status: AC
  Administered 2011-12-26: 1000 mg via INTRAVENOUS

## 2011-12-26 MED ORDER — DULOXETINE HCL 60 MG PO CPEP
60.0000 mg | ORAL_CAPSULE | Freq: Every day | ORAL | Status: DC
  Filled 2011-12-26: qty 1

## 2011-12-26 MED ORDER — ONDANSETRON HCL 4 MG PO TABS: 4.0000 mg | ORAL_TABLET | Freq: Four times a day (QID) | ORAL | Status: DC | PRN

## 2011-12-26 MED ORDER — FENTANYL CITRATE 0.05 MG/ML IJ SOLN
50.0000 ug | Freq: Once | INTRAMUSCULAR | Status: AC
  Administered 2011-12-26: 50 ug via INTRAVENOUS

## 2011-12-26 MED ORDER — ONDANSETRON HCL 4 MG/2ML IJ SOLN
INTRAMUSCULAR | Status: DC | PRN
  Administered 2011-12-26: 4 mg via INTRAVENOUS

## 2011-12-26 MED ORDER — WARFARIN SODIUM 5 MG PO TABS: ORAL_TABLET | ORAL | Status: DC

## 2011-12-26 MED ORDER — BENAZEPRIL-HYDROCHLOROTHIAZIDE 20-25 MG PO TABS: 1.0000 | ORAL_TABLET | Freq: Every day | ORAL | Status: DC

## 2011-12-26 MED ORDER — METFORMIN HCL 500 MG PO TABS
500.0000 mg | ORAL_TABLET | Freq: Two times a day (BID) | ORAL | Status: DC
  Filled 2011-12-26: qty 1

## 2011-12-26 MED ORDER — ONDANSETRON HCL 4 MG/2ML IJ SOLN: 4.0000 mg | Freq: Four times a day (QID) | INTRAMUSCULAR | Status: DC | PRN

## 2011-12-26 MED ORDER — METOCLOPRAMIDE HCL 5 MG/ML IJ SOLN: 5.0000 mg | Freq: Three times a day (TID) | INTRAMUSCULAR | Status: DC | PRN

## 2011-12-26 MED ORDER — LINAGLIPTIN 5 MG PO TABS
5.0000 mg | ORAL_TABLET | Freq: Every day | ORAL | Status: DC
  Administered 2011-12-26: 5 mg via ORAL
  Filled 2011-12-26: qty 1

## 2011-12-26 MED ORDER — HYDROMORPHONE HCL PF 1 MG/ML IJ SOLN: 0.5000 mg | INTRAMUSCULAR | Status: DC | PRN

## 2011-12-26 MED ORDER — MIRTAZAPINE 15 MG PO TABS
15.0000 mg | ORAL_TABLET | Freq: Every day | ORAL | Status: DC
  Administered 2011-12-26: 15 mg via ORAL

## 2011-12-26 MED ORDER — HYDROCODONE-ACETAMINOPHEN 7.5-325 MG PO TABS
1.0000 | ORAL_TABLET | Freq: Four times a day (QID) | ORAL | Status: DC | PRN
  Administered 2011-12-26: 1 via ORAL
  Filled 2011-12-26: qty 1

## 2011-12-26 MED ORDER — PRIMIDONE 50 MG PO TABS
50.0000 mg | ORAL_TABLET | Freq: Every day | ORAL | Status: DC
Start: 2011-12-26 — End: 2011-12-27
  Filled 2011-12-26: qty 1

## 2011-12-26 MED ORDER — METOCLOPRAMIDE HCL 5 MG PO TABS: 5.0000 mg | ORAL_TABLET | Freq: Three times a day (TID) | ORAL | Status: DC | PRN

## 2011-12-26 MED ORDER — OXYCODONE-ACETAMINOPHEN 5-325 MG PO TABS: ORAL_TABLET | ORAL | Status: DC

## 2011-12-26 MED ORDER — PANTOPRAZOLE SODIUM 40 MG PO TBEC
40.0000 mg | DELAYED_RELEASE_TABLET | Freq: Every day | ORAL | Status: DC
  Filled 2011-12-26: qty 1

## 2011-12-26 MED ORDER — CHLORHEXIDINE GLUCONATE 4 % EX LIQD: 60.0000 mL | Freq: Once | CUTANEOUS | Status: DC

## 2011-12-26 MED ORDER — SODIUM CHLORIDE 0.9 % IV SOLN
INTRAVENOUS | Status: DC
  Administered 2011-12-26: 20 mL/h via INTRAVENOUS

## 2011-12-26 MED ORDER — DILTIAZEM HCL ER COATED BEADS 360 MG PO CP24
360.0000 mg | ORAL_CAPSULE | Freq: Every day | ORAL | Status: DC
  Filled 2011-12-26: qty 1

## 2011-12-26 MED ORDER — WARFARIN SODIUM 10 MG PO TABS
10.0000 mg | ORAL_TABLET | Freq: Once | ORAL | Status: AC
  Administered 2011-12-26: 10 mg via ORAL

## 2011-12-26 MED ORDER — SULFAMETHOXAZOLE-TMP DS 800-160 MG PO TABS: 1.0000 | ORAL_TABLET | Freq: Two times a day (BID) | ORAL | Status: DC

## 2011-12-26 MED ORDER — INSULIN GLARGINE 100 UNIT/ML ~~LOC~~ SOLN
36.0000 [IU] | Freq: Every day | SUBCUTANEOUS | Status: DC
  Administered 2011-12-26: 36 [IU] via SUBCUTANEOUS
  Filled 2011-12-26: qty 3

## 2011-12-26 MED ORDER — LACTATED RINGERS IV SOLN
INTRAVENOUS | Status: DC
  Administered 2011-12-26: 12:00:00 via INTRAVENOUS

## 2011-12-26 MED ORDER — WARFARIN SODIUM 10 MG PO TABS: 10.0000 mg | ORAL_TABLET | Freq: Once | ORAL | Status: DC

## 2011-12-26 MED ORDER — ROPIVACAINE HCL 5 MG/ML IJ SOLN
INTRAMUSCULAR | Status: DC | PRN
  Administered 2011-12-26: 30 mL via EPIDURAL

## 2011-12-26 SURGICAL SUPPLY — 63 items
BAG DECANTER FOR FLEXI CONT (MISCELLANEOUS) IMPLANT
BANDAGE ADHESIVE 1X3 (GAUZE/BANDAGES/DRESSINGS) IMPLANT
BANDAGE ELASTIC 3 VELCRO ST LF (GAUZE/BANDAGES/DRESSINGS) ×2 IMPLANT
BANDAGE ELASTIC 4 VELCRO ST LF (GAUZE/BANDAGES/DRESSINGS) ×2 IMPLANT
BANDAGE GAUZE ELAST BULKY 4 IN (GAUZE/BANDAGES/DRESSINGS) ×4 IMPLANT
BLADE MINI RND TIP GREEN BEAV (BLADE) ×1 IMPLANT
BLADE SURG 15 STRL LF DISP TIS (BLADE) ×1 IMPLANT
BLADE SURG 15 STRL SS (BLADE) ×2
BNDG CMPR 9X4 STRL LF SNTH (GAUZE/BANDAGES/DRESSINGS) ×1
BNDG CMPR MD 5X2 ELC HKLP STRL (GAUZE/BANDAGES/DRESSINGS) ×1
BNDG ELASTIC 2 VLCR STRL LF (GAUZE/BANDAGES/DRESSINGS) ×1 IMPLANT
BNDG ESMARK 4X9 LF (GAUZE/BANDAGES/DRESSINGS) ×2 IMPLANT
BRUSH SCRUB EZ PLAIN DRY (MISCELLANEOUS) ×2 IMPLANT
CANISTER SUCTION 1200CC (MISCELLANEOUS) ×1 IMPLANT
CLOTH BEACON ORANGE TIMEOUT ST (SAFETY) ×2 IMPLANT
CORDS BIPOLAR (ELECTRODE) ×2 IMPLANT
COVER MAYO STAND STRL (DRAPES) ×2 IMPLANT
COVER TABLE BACK 60X90 (DRAPES) ×2 IMPLANT
CUFF TOURNIQUET SINGLE 18IN (TOURNIQUET CUFF) IMPLANT
CUFF TOURNIQUET SINGLE 24IN (TOURNIQUET CUFF) ×1 IMPLANT
DECANTER SPIKE VIAL GLASS SM (MISCELLANEOUS) IMPLANT
DRAPE EXTREMITY T 121X128X90 (DRAPE) ×2 IMPLANT
DRAPE OEC MINIVIEW 54X84 (DRAPES) ×2 IMPLANT
DRAPE SURG 17X23 STRL (DRAPES) ×2 IMPLANT
GAUZE XEROFORM 1X8 LF (GAUZE/BANDAGES/DRESSINGS) ×2 IMPLANT
GLOVE BIO SURGEON STRL SZ 6.5 (GLOVE) ×1 IMPLANT
GLOVE BIOGEL M STRL SZ7.5 (GLOVE) ×2 IMPLANT
GLOVE BIOGEL PI IND STRL 7.0 (GLOVE) IMPLANT
GLOVE BIOGEL PI INDICATOR 7.0 (GLOVE) ×1
GLOVE ORTHO TXT STRL SZ7.5 (GLOVE) ×2 IMPLANT
GLOVE SKINSENSE NS SZ7.0 (GLOVE) ×2
GLOVE SKINSENSE STRL SZ7.0 (GLOVE) ×2 IMPLANT
GOWN PREVENTION PLUS XLARGE (GOWN DISPOSABLE) ×6 IMPLANT
GOWN PREVENTION PLUS XXLARGE (GOWN DISPOSABLE) ×4 IMPLANT
LOOP VESSEL MAXI BLUE (MISCELLANEOUS) IMPLANT
NEEDLE 27GAX1X1/2 (NEEDLE) IMPLANT
NS IRRIG 1000ML POUR BTL (IV SOLUTION) ×2 IMPLANT
PACK BASIN DAY SURGERY FS (CUSTOM PROCEDURE TRAY) ×2 IMPLANT
PAD CAST 3X4 CTTN HI CHSV (CAST SUPPLIES) ×2 IMPLANT
PADDING CAST ABS 4INX4YD NS (CAST SUPPLIES) ×1
PADDING CAST ABS COTTON 4X4 ST (CAST SUPPLIES) ×1 IMPLANT
PADDING CAST COTTON 3X4 STRL (CAST SUPPLIES) ×4
SLEEVE SCD COMPRESS KNEE MED (MISCELLANEOUS) ×1 IMPLANT
SPLINT PLASTER CAST XFAST 3X15 (CAST SUPPLIES) ×1 IMPLANT
SPLINT PLASTER XTRA FASTSET 3X (CAST SUPPLIES) ×10
SPONGE GAUZE 4X4 12PLY (GAUZE/BANDAGES/DRESSINGS) ×2 IMPLANT
STOCKINETTE 4X48 STRL (DRAPES) ×2 IMPLANT
STRIP CLOSURE SKIN 1/2X4 (GAUZE/BANDAGES/DRESSINGS) ×2 IMPLANT
SUCTION FRAZIER TIP 10 FR DISP (SUCTIONS) IMPLANT
SUT ETHIBOND 3-0 V-5 (SUTURE) ×1 IMPLANT
SUT PROLENE 3 0 PS 2 (SUTURE) ×2 IMPLANT
SUT VIC AB 2-0 PS2 27 (SUTURE) ×2 IMPLANT
SUT VIC AB 3-0 FS2 27 (SUTURE) IMPLANT
SUT VIC AB 4-0 P-3 18XBRD (SUTURE) ×1 IMPLANT
SUT VIC AB 4-0 P3 18 (SUTURE) ×2
SYR 3ML 23GX1 SAFETY (SYRINGE) IMPLANT
SYR BULB 3OZ (MISCELLANEOUS) ×2 IMPLANT
SYR CONTROL 10ML LL (SYRINGE) IMPLANT
TOWEL OR 17X24 6PK STRL BLUE (TOWEL DISPOSABLE) ×2 IMPLANT
TRAY DSU PREP LF (CUSTOM PROCEDURE TRAY) ×2 IMPLANT
TUBE CONNECTING 20X1/4 (TUBING) ×1 IMPLANT
UNDERPAD 30X30 INCONTINENT (UNDERPADS AND DIAPERS) ×1 IMPLANT
WATER STERILE IRR 1000ML POUR (IV SOLUTION) ×1 IMPLANT

## 2011-12-26 NOTE — Progress Notes (Signed)
Assisted Dr. Hodierne with right, ultrasound guided, interscalene  block. Side rails up, monitors on throughout procedure. See vital signs in flow sheet. Tolerated Procedure well. 

## 2011-12-26 NOTE — Transfer of Care (Signed)
Immediate Anesthesia Transfer of Care Note  Patient: Rebecca [name]  Procedure(s) Performed: Procedure(s) (LRB) with comments: CARPECTOMY (Right) - PROXIMAL ROW CARPECTOMY RIGHT WRIST and neurectomy  Patient Location: PACU  Anesthesia Type:General and Regional  Level of Consciousness: awake, alert , oriented and patient cooperative  Airway & Oxygen Therapy: Patient Spontanous Breathing and Patient connected to face mask oxygen  Post-op Assessment: Report given to PACU RN and Post -op Vital signs reviewed and stable  Post vital signs: Reviewed and stable  Complications: No apparent anesthesia complications

## 2011-12-26 NOTE — Anesthesia Procedure Notes (Addendum)
Anesthesia Regional Block:  Supraclavicular block  Pre-Anesthetic Checklist: ,, timeout performed, Correct Patient, Correct Site, Correct Laterality, Correct Procedure, Correct Position, site marked, Risks and benefits discussed,  Surgical consent,  Pre-op evaluation,  At surgeon's request and post-op pain management  Laterality: Right  Prep: chloraprep       Needles:  Injection technique: Single-shot  Needle Type: Echogenic Stimulator Needle     Needle Length: 5cm 5 cm Needle Gauge: 22 and 22 G    Additional Needles:  Procedures: ultrasound guided (picture in chart) and nerve stimulator Supraclavicular block  Nerve Stimulator or Paresthesia:  Response: biceps flexion, 0.45 mA,   Additional Responses:   Narrative:  Start time: 12/26/2011 12:01 PM End time: 12/26/2011 12:14 PM Injection made incrementally with aspirations every 5 mL.  Performed by: Personally  Anesthesiologist: Dr Chaney Malling  Additional Notes: Functioning IV was confirmed and monitors were applied.  A 50mm 22ga Arrow echogenic stimulator needle was used. Sterile prep and drape,hand hygiene and sterile gloves were used.  Negative aspiration and negative test dose prior to incremental administration of local anesthetic. The patient tolerated the procedure well.  Ultrasound guidance: relevent anatomy identified, needle position confirmed, local anesthetic spread visualized around nerve(s), vascular puncture avoided.  Image printed for medical record.   Supraclavicular block Procedure Name: LMA Insertion Date/Time: 12/26/2011 1:04 PM Performed by: Diandra Cimini D Pre-anesthesia Checklist: Patient identified, Emergency Drugs available, Suction available and Patient being monitored Patient Re-evaluated:Patient Re-evaluated prior to inductionOxygen Delivery Method: Circle System Utilized Preoxygenation: Pre-oxygenation with 100% oxygen Intubation Type: IV induction Ventilation: Mask ventilation without  difficulty LMA: LMA inserted LMA Size: 4.0 Number of attempts: 1 Airway Equipment and Method: bite block Placement Confirmation: positive ETCO2 Tube secured with: Tape Dental Injury: Teeth and Oropharynx as per pre-operative assessment

## 2011-12-26 NOTE — Anesthesia Preprocedure Evaluation (Signed)
Anesthesia Evaluation  Patient identified by MRN, date of birth, ID band Patient awake    Reviewed: Allergy & Precautions, H&P , NPO status , Patient's Chart, lab work & pertinent test results  Airway Mallampati: II  Neck ROM: full    Dental   Pulmonary shortness of breath,          Cardiovascular hypertension,     Neuro/Psych  Neuromuscular disease    GI/Hepatic hiatal hernia, GERD-  ,  Endo/Other  diabetes, Type obesity  Renal/GU      Musculoskeletal   Abdominal   Peds  Hematology   Anesthesia Other Findings   Reproductive/Obstetrics                           Anesthesia Physical Anesthesia Plan  ASA: III  Anesthesia Plan: General   Post-op Pain Management:    Induction: Intravenous  Airway Management Planned: LMA  Additional Equipment:   Intra-op Plan:   Post-operative Plan:   Informed Consent: I have reviewed the patients History and Physical, chart, labs and discussed the procedure including the risks, benefits and alternatives for the proposed anesthesia with the patient or authorized representative who has indicated his/her understanding and acceptance.     Plan Discussed with: CRNA and Surgeon  Anesthesia Plan Comments:         Anesthesia Quick Evaluation

## 2011-12-26 NOTE — Brief Op Note (Signed)
12/26/2011  1:56 PM  PATIENT:  Rebecca [name]  71 y.o. female  PRE-OPERATIVE DIAGNOSIS:  DISSOCIATIVE CARPAL INSTABILITY RIGHT WRIST  POST-OPERATIVE DIAGNOSIS:  DISSOCIATIVE CARPAL INSTABILITY RIGHT WRIST  PROCEDURE:  Procedure(s) (LRB) with comments: CARPECTOMY (Right) - PROXIMAL ROW CARPECTOMY RIGHT WRIST and neurectomy  SURGEON:  Surgeon(s) and Role:    * Wyn Forster., MD - Primary  PHYSICIAN ASSISTANT:   ASSISTANTS: Mallory Shirk.A-C   ANESTHESIA:   general  EBL:     BLOOD ADMINISTERED:none  DRAINS: none   LOCAL MEDICATIONS USED:  XYLOCAINE   SPECIMEN:  No Specimen  DISPOSITION OF SPECIMEN:  N/A  COUNTS:  YES  TOURNIQUET:  * Missing tourniquet times found for documented tourniquets in log:  [id number] *  DICTATION: .Other Dictation: Dictation Number [id number]  PLAN OF CARE: Discharge to home after PACU  PATIENT DISPOSITION:  PACU - hemodynamically stable.

## 2011-12-26 NOTE — Anesthesia Postprocedure Evaluation (Signed)
Anesthesia Post Note  Patient: Rebecca [name]  Procedure(s) Performed: Procedure(s) (LRB): CARPECTOMY (Right)  Anesthesia type: General  Patient location: PACU  Post pain: Pain level controlled and Adequate analgesia  Post assessment: Post-op Vital signs reviewed, Patient's Cardiovascular Status Stable, Respiratory Function Stable, Patent Airway and Pain level controlled  Last Vitals:  Filed Vitals:   12/26/11 1409  BP:   Pulse:   Temp: 36.7 C  Resp: 22    Post vital signs: Reviewed and stable  Level of consciousness: awake, alert  and oriented  Complications: No apparent anesthesia complications

## 2011-12-26 NOTE — Op Note (Signed)
[id number]  

## 2011-12-27 ENCOUNTER — Encounter (HOSPITAL_BASED_OUTPATIENT_CLINIC_OR_DEPARTMENT_OTHER): Payer: Self-pay | Admitting: Orthopedic Surgery

## 2011-12-27 LAB — GLUCOSE, CAPILLARY
Glucose-Capillary: 211 mg/dL — ABNORMAL HIGH (ref 70–99)
Glucose-Capillary: 194 mg/dL — ABNORMAL HIGH (ref 70–99)

## 2011-12-27 NOTE — Op Note (Signed)
NAMESUESAN, MOHRMANN                 ACCOUNT NO.:  [id number]  MEDICAL RECORD NO.:  [id number]  LOCATION:  RAD                           FACILITY:  [facility]  PHYSICIAN:  Katy Fitch. Buelah Rennie, M.D. DATE OF BIRTH:  1940-05-06  DATE OF PROCEDURE:  12/26/2011 DATE OF DISCHARGE:  10/21/2011                              OPERATIVE REPORT   PREOPERATIVE DIAGNOSES:  Painful dissociative carpal instability right wrist with a volar intercalated segment instability pattern and chronic scapholunate diastasis.  POSTOPERATIVE DIAGNOSES:  Painful dissociative carpal instability right wrist with a volar intercalated segment instability pattern and chronic scapholunate diastasis with identification of complete loss of hyaline articular cartilage on palmar aspect of lunate and a small portion of the lunate facet at dorsal aspect due to chronic volar intercalated segment instability  OPERATION: 1. Right wrist proximal row carpectomy. 2. Right wrist posterior interosseous segmental neurectomy.  OPERATING SURGEON:  Katy Fitch. Scotty Weigelt, MD  ASSISTANT:  Marveen Reeks Dasnoit, PA-C  ANESTHESIA:  General by LMA supplemented by a left interscalene plexus block by the anesthesiologist, Dr. Chaney Malling.  INDICATION:  Rebecca [name] is a 71 year old, right-hand dominant woman who has had multiple complex medical problems.  She is on chronic Coumadin anticoagulation, and has a history of diabetes.  She has had a history of deep vein thrombosis in the remote past.  She is significantly obese.  She is followed by Dr. Isabella Stalling of [facility].  We have treated Rebecca [name] in the past and recently had a referral from Dr. Fuller Canada in [place], [place] for definitive treatment of right wrist pain.  Dr. Romeo Apple saw Rebecca [name] after fall and noted dissociative carpal instability of the right wrist.  As she was familiar with our practice he referred her for an upper extremity orthopaedic  consult.  Her past history was updated.  She has multiple significant drug allergies which include Thimerosal, intolerance to codeine, intolerance to penicillin, and Compazine.  Preoperatively, she was advised as to the potential risks and benefits of surgery.  She has failed nonoperative pain management, therefore we advised her to proceed with posterior interosseous nerve neurectomy and proximal row carpectomy.  If we found significant hyaline articular cartilage injury on the proximal capitate, our plan was to proceed with a probable scaphoid excision and ulnar four-bone fusion.  Question regarding the anticipated surgery invited and answered in detail as well as a detailed consult in the holding area preoperatively with her son present.  Rebecca [name] was interviewed by Dr. Chaney Malling of Anesthesia.  General anesthesia by LMA technique was recommended and accepted.  A perioperative block with ropivacaine was also recommended and after informed consent placed by Dr. Chaney Malling in the holding area.  Excellent anesthesia of the right arm and forequarter was achieved.  PROCEDURE:  Rebecca [name] was brought to room 1 of the [facility] and placed in supine position on the operating table.  Following induction of general anesthesia by LMA technique, the right arm was prepped with Betadine soap and solution and sterilely draped.  A 1 g of vancomycin was administered as an IV prophylactic antibiotic due to background drug allergies.  Following routine surgical  time-out, the right arm was exsanguinated carefully with Esmarch bandage and an arterial tourniquet on the proximal right brachium inflated to 220 mmHg.  A 3-cm incision was fashioned from the area on the radial aspect of the fourth dorsal compartment to the base of the long finger metacarpal.  The skin incision was taken sharply followed by gentle dissection  of the subcutaneous tissues identifying the radial superficial  sensory branches.  The extensor retinaculum was split on the ulnar aspect of the third dorsal compartment taking care to identify and protect the extensor pollicis longus.  The extensors in the fourth dorsal compartment were retracted ulnarly, and the posterior interosseous nerve and vessel identified.  A 1-cm segment was resected of the distal radius.  Due to the fact Rebecca [name] is on Coumadin anticoagulation, I elected not to perform a deep dissection into the volar forearm from a dorsal approach.  My concern was that bleeding in this area could cause significant complications, and with a proximal row carpectomy her pain should be dramatically improved.  Hemostasis was achieved of the posterior interosseous vessel followed by a longitudinal capsulotomy.  The ligaments were elevated off the base of the scapholunate triquetrum.  The scaphoid was removed en bloc by use of an osteotome to strip the ligaments volarly.  Likewise, the lunate was removed en bloc by carefully rotating the lunate superiorly while using an osteotome to release the volar ligaments.  The triquetrum was more challenging in that the volar ligaments were quite dense and with great care they were released with scissors dissection under direct vision, and use of an osteotome for subperiosteal dissection.  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  After synovectomy was accomplished, hemostasis was achieved.  The proximal capitate had excellent hyaline cartilage.  The lunate facet had excellent hyaline cartilage in more than 95% of its surface.  The capsule was then imbricated with invagination with multiple figure-of- eight mattress sutures of 3-0 Ethibond.  The extensor retinaculum was repaired anatomically.  The fascia was repaired with subcutaneous 4-0 Vicryl, and the skin repaired with  subcutaneous 4-0 Vicryl and intradermal 3-0 Prolene.  Rebecca [name] was placed in compressive hand dressing and wrist forearm splint.  She will be discharged home to the care of her son with prescriptions for Percocet 5 mg 1 or 2 tablets p.o. q.4-6 hours p.r.n. pain, 30 tablets without refill.  She will take doxycycline 100 mg p.o. b.i.d. x4 days as a prophylactic antibiotic.     Katy Fitch Anastasija Anfinson, M.D.     RVS/MEDQ  D:  12/26/2011  T:  12/27/2011  Job:  [id number]  cc:   Vickki Hearing, M.D.

## 2011-12-27 NOTE — Addendum Note (Signed)
Addendum  created 12/27/11 1130 by Aleshia Cartelli D Electra Paladino, CRNA   Modules edited:Anesthesia Responsible Staff    

## 2011-12-27 NOTE — Addendum Note (Signed)
Addendum  created 12/27/11 1130 by Jewel Baize Emma Birchler, CRNA   Modules edited:Anesthesia Responsible Staff

## 2011-12-30 ENCOUNTER — Encounter (HOSPITAL_BASED_OUTPATIENT_CLINIC_OR_DEPARTMENT_OTHER): Payer: Self-pay

## 2012-02-10 ENCOUNTER — Other Ambulatory Visit (HOSPITAL_COMMUNITY): Payer: Self-pay | Admitting: Family Medicine

## 2012-02-10 DIAGNOSIS — Z139 Encounter for screening, unspecified: Secondary | ICD-10-CM

## 2012-03-02 ENCOUNTER — Other Ambulatory Visit: Payer: Self-pay | Admitting: Family Medicine

## 2012-03-02 ENCOUNTER — Ambulatory Visit (HOSPITAL_COMMUNITY): Payer: PRIVATE HEALTH INSURANCE

## 2012-03-02 DIAGNOSIS — Z1231 Encounter for screening mammogram for malignant neoplasm of breast: Secondary | ICD-10-CM

## 2012-04-02 ENCOUNTER — Ambulatory Visit
Admission: RE | Admit: 2012-04-02 | Discharge: 2012-04-02 | Disposition: A | Discharge status: Home / Self Care | Payer: PRIVATE HEALTH INSURANCE | Source: Ambulatory Visit | Attending: Family Medicine | Admitting: Family Medicine

## 2012-07-09 ENCOUNTER — Encounter (INDEPENDENT_AMBULATORY_CARE_PROVIDER_SITE_OTHER): Payer: Self-pay | Admitting: *Deleted

## 2012-07-13 ENCOUNTER — Other Ambulatory Visit (INDEPENDENT_AMBULATORY_CARE_PROVIDER_SITE_OTHER): Payer: Self-pay | Admitting: Internal Medicine

## 2012-07-14 ENCOUNTER — Other Ambulatory Visit (INDEPENDENT_AMBULATORY_CARE_PROVIDER_SITE_OTHER): Payer: Self-pay | Admitting: Internal Medicine

## 2012-07-29 ENCOUNTER — Ambulatory Visit (INDEPENDENT_AMBULATORY_CARE_PROVIDER_SITE_OTHER): Payer: PRIVATE HEALTH INSURANCE | Admitting: Internal Medicine

## 2012-07-29 ENCOUNTER — Encounter (INDEPENDENT_AMBULATORY_CARE_PROVIDER_SITE_OTHER): Payer: Self-pay | Admitting: Internal Medicine

## 2012-07-29 VITALS — BP 126/52 | HR 84 | Temp 97.9°F | Ht 68.5 in | Wt 248.2 lb

## 2012-07-29 DIAGNOSIS — D649 Anemia, unspecified: Secondary | ICD-10-CM

## 2012-07-29 DIAGNOSIS — K7689 Other specified diseases of liver: Secondary | ICD-10-CM

## 2012-07-29 DIAGNOSIS — K760 Fatty (change of) liver, not elsewhere classified: Secondary | ICD-10-CM

## 2012-07-29 LAB — COMPREHENSIVE METABOLIC PANEL
AST: 26 U/L (ref 0–37)
Albumin: 4.3 g/dL (ref 3.5–5.2)
Alkaline Phosphatase: 65 U/L (ref 39–117)
BUN: 15 mg/dL (ref 6–23)
Calcium: 9.7 mg/dL (ref 8.4–10.5)
Creat: 0.95 mg/dL (ref 0.50–1.10)
Glucose, Bld: 175 mg/dL — ABNORMAL HIGH (ref 70–99)
Potassium: 4.7 mEq/L (ref 3.5–5.3)

## 2012-07-29 LAB — CBC WITH DIFFERENTIAL/PLATELET
Basophils Absolute: 0 10*3/uL (ref 0.0–0.1)
Basophils Relative: 0 % (ref 0–1)
Eosinophils Relative: 2 % (ref 0–5)
HCT: 42.5 % (ref 36.0–46.0)
Hemoglobin: 14.4 g/dL (ref 12.0–15.0)
MCH: 29.8 pg (ref 26.0–34.0)
MCHC: 33.9 g/dL (ref 30.0–36.0)
MCV: 88 fL (ref 78.0–100.0)
Monocytes Absolute: 0.7 10*3/uL (ref 0.1–1.0)
Monocytes Relative: 7 % (ref 3–12)
Neutro Abs: 6.8 10*3/uL (ref 1.7–7.7)
RDW: 14.6 % (ref 11.5–15.5)

## 2012-07-29 NOTE — Patient Instructions (Addendum)
Labs today. OV in 1 yr.  

## 2012-07-29 NOTE — Progress Notes (Signed)
Subjective:     Patient ID: Rebecca [name], female   DOB: [birth year], 72 y.o.   MRN: [id number]  HPI Here today for f/u. She tells me she is doing good.  Her appetite is good. No weight loss.  She tells me her acid reflux is controlled with Omeprazole.  She is eating 3 meals a days. Hx of gastroparesis. She usually has a BM daily. No melena or black stools.  She tells me she has a hx of a prolapsed colon. If she has multiple BMs her rectum will prolapse.      History of colonic adenomas. Last exam was in May 2007.  #5. Non-alcoholic fatty liver disease. EGD in October 2011 revealed grade 1 esophageal varices. Recent transaminases are normal. Her liver disease may become an issue in future with poorly controlled diabetes and obesity.     CBC    Component Value Date/Time   WBC 10.4 07/16/2011 0931   RBC 4.93 07/16/2011 0931   HGB 14.6 07/16/2011 0931   HCT 42.9 07/16/2011 0931   PLT 274 07/16/2011 0931   MCV 87.0 07/16/2011 0931   MCH 29.6 07/16/2011 0931   MCHC 34.0 07/16/2011 0931   RDW 13.9 07/16/2011 0931   LYMPHSABS 1.4 10/24/2010 0710   MONOABS 1.1* 10/24/2010 0710   EOSABS 0.2 10/24/2010 0710   BASOSABS 0.0 10/24/2010 0710   CMP     Component Value Date/Time   NA 138 12/25/2011 1400   K 4.6 12/25/2011 1400   CL 100 12/25/2011 1400   CO2 25 12/25/2011 1400   GLUCOSE 158* 12/25/2011 1400   BUN 18 12/25/2011 1400   CREATININE 0.80 12/25/2011 1400   CALCIUM 9.4 12/25/2011 1400   PROT 6.7 10/30/2010 0625   ALBUMIN 3.0* 10/30/2010 0625   AST 25 10/30/2010 0625   ALT 48* 10/30/2010 0625   ALKPHOS 91 10/30/2010 0625   BILITOT 0.3 10/30/2010 0625   GFRNONAA 72* 12/25/2011 1400   GFRAA 83* 12/25/2011 1400          Review of Systems see hp;i Current Outpatient Prescriptions  Medication Sig Dispense Refill  . benazepril-hydrochlorthiazide (LOTENSIN HCT) 20-25 MG per tablet Take 1 tablet by mouth daily.        . celecoxib (CELEBREX) 200 MG capsule Take 200 mg by mouth daily as  needed. For arthritis      . clomiPRAMINE (ANAFRANIL) 25 MG capsule Take 25 mg by mouth 4 (four) times daily.       Marland Kitchen diltiazem (CARDIZEM CD) 180 MG 24 hr capsule Take 2 capsules (360 mg total) by mouth daily.  30 capsule  3  . DULoxetine (CYMBALTA) 60 MG capsule Take 60 mg by mouth daily.        . fish oil-omega-3 fatty acids 1000 MG capsule Take 1 g by mouth daily.      . insulin glargine (LANTUS) 100 UNIT/ML injection Inject 36 Units into the skin at bedtime.       Marland Kitchen linagliptin (TRADJENTA) 5 MG TABS tablet Take 5 mg by mouth daily.      . metFORMIN (GLUCOPHAGE) 500 MG tablet Take 500 mg by mouth 2 (two) times daily with a meal.        . mirtazapine (REMERON) 15 MG tablet Take 15 mg by mouth at bedtime.        Marland Kitchen omeprazole (PRILOSEC) 20 MG capsule TAKE 1 CAPSULE BY MOUTH TWICE DAILY FOR ACID REFLUX  60 capsule  5  . potassium chloride (MICRO-K) 10  MEQ CR capsule Take 10 mEq by mouth 4 (four) times daily.       Marland Kitchen PRESCRIPTION MEDICATION Take 1 tablet by mouth every other day. Ferrocet Iron Tablet      . primidone (MYSOLINE) 50 MG tablet Take 50 mg by mouth daily.        . QUEtiapine (SEROQUEL) 100 MG tablet Take 100 mg by mouth at bedtime.      . saxagliptin HCl (ONGLYZA) 2.5 MG TABS tablet Take 5 mg by mouth daily.      Marland Kitchen terbinafine (LAMISIL) 1 % cream Apply topically 2 (two) times daily.      Marland Kitchen warfarin (COUMADIN) 5 MG tablet See written post operative Coumadin instructions.  1 tablet  0  . zolpidem (AMBIEN) 10 MG tablet Take 10 mg by mouth at bedtime.         No current facility-administered medications for this visit.   Past Medical History  Diagnosis Date  . Diabetes mellitus   . DVT (deep venous thrombosis)   . Neuropathy   . Narcolepsy   . Cataplexy   . GERD (gastroesophageal reflux disease)   . Hiatal hernia   . Hypertension   . Shortness of breath    Past Surgical History  Procedure Laterality Date  . Abdominal hysterectomy    . Hemorroidectomy    . Carpal tunnel  release    . Cholecystectomy    . Kidney surgery    . Total hip arthroplasty      bilateral  . Nephrostomy    . Carpectomy  12/26/2011    Procedure: CARPECTOMY;  Surgeon: Wyn Forster., MD;  Location: [facility];  Service: Orthopedics;  Laterality: Right;  PROXIMAL ROW CARPECTOMY RIGHT WRIST and neurectomy   Allergies  Allergen Reactions  . Codeine Anxiety  . Compazine Anaphylaxis  . Other Anaphylaxis    Thermasol  . Penicillins Shortness Of Breath and Rash  . Morphine And Related Other (See Comments)    Patient states that it makes her lose her state of mind.         Objective:   Physical Exam  Filed Vitals:   07/29/12 1055  BP: 126/52  Pulse: 84  Temp: 97.9 F (36.6 C)  Height: 5' 8.5" (1.74 m)  Weight: 248 lb 3.2 oz (112.583 kg)  Alert and oriented. Skin warm and dry. Oral mucosa is moist.   . Sclera anicteric, conjunctivae is pink. Thyroid not enlarged. No cervical lymphadenopathy. Lungs clear. Heart regular rate and rhythm.  Abdomen is soft. Bowel sounds are positive. No hepatomegaly. No abdominal masses felt. No tenderness. Abdomen is obese. No edema to lower extremities.  .      Assessment:Plan    GERD: For the most part is controlled with Omeprazole. Hx of iron deficiency. Last CBC was normal. Will repeat CBC today. NAFLD. EGD in October 2011 revealed grade 1 esophageal varices. Will repeat a Cmet today.  Her last colonoscopy was in 2008 tubular adenoma. She will let our office know when she is ready to proceed with a colonoscopy.    OV in one year.

## 2013-02-08 ENCOUNTER — Other Ambulatory Visit (INDEPENDENT_AMBULATORY_CARE_PROVIDER_SITE_OTHER): Payer: Self-pay | Admitting: Internal Medicine

## 2013-05-11 ENCOUNTER — Other Ambulatory Visit: Payer: Self-pay

## 2013-05-11 ENCOUNTER — Ambulatory Visit
Admission: RE | Admit: 2013-05-11 | Discharge: 2013-05-11 | Disposition: A | Discharge status: Home / Self Care | Payer: PRIVATE HEALTH INSURANCE | Source: Ambulatory Visit

## 2013-05-11 DIAGNOSIS — Z1231 Encounter for screening mammogram for malignant neoplasm of breast: Secondary | ICD-10-CM

## 2013-07-29 ENCOUNTER — Other Ambulatory Visit (INDEPENDENT_AMBULATORY_CARE_PROVIDER_SITE_OTHER): Payer: Self-pay | Admitting: Internal Medicine

## 2013-07-29 NOTE — Telephone Encounter (Signed)
Per Dr.Rehman may fill with 5 refills 

## 2014-02-06 ENCOUNTER — Other Ambulatory Visit (INDEPENDENT_AMBULATORY_CARE_PROVIDER_SITE_OTHER): Payer: Self-pay | Admitting: Internal Medicine

## 2014-05-02 ENCOUNTER — Other Ambulatory Visit: Payer: Self-pay

## 2014-05-02 DIAGNOSIS — Z1231 Encounter for screening mammogram for malignant neoplasm of breast: Secondary | ICD-10-CM

## 2014-06-06 ENCOUNTER — Ambulatory Visit
Admission: RE | Admit: 2014-06-06 | Discharge: 2014-06-06 | Disposition: A | Discharge status: Home / Self Care | Payer: Medicare Other | Source: Ambulatory Visit

## 2014-06-06 DIAGNOSIS — Z1231 Encounter for screening mammogram for malignant neoplasm of breast: Secondary | ICD-10-CM

## 2014-07-19 ENCOUNTER — Other Ambulatory Visit (INDEPENDENT_AMBULATORY_CARE_PROVIDER_SITE_OTHER): Payer: Self-pay | Admitting: Internal Medicine

## 2014-08-16 ENCOUNTER — Other Ambulatory Visit (INDEPENDENT_AMBULATORY_CARE_PROVIDER_SITE_OTHER): Payer: Self-pay | Admitting: Internal Medicine

## 2014-09-05 ENCOUNTER — Inpatient Hospital Stay (HOSPITAL_COMMUNITY)
Admission: EM | Admit: 2014-09-05 | Discharge: 2014-09-09 | DRG: 871 | Disposition: A | Discharge status: Home / Self Care | Payer: Medicare Other | Attending: Family Medicine | Admitting: Family Medicine

## 2014-09-05 ENCOUNTER — Encounter (HOSPITAL_COMMUNITY): Payer: Self-pay

## 2014-09-05 ENCOUNTER — Emergency Department (HOSPITAL_COMMUNITY): Payer: Medicare Other

## 2014-09-05 DIAGNOSIS — Z794 Long term (current) use of insulin: Secondary | ICD-10-CM

## 2014-09-05 DIAGNOSIS — N12 Tubulo-interstitial nephritis, not specified as acute or chronic: Secondary | ICD-10-CM | POA: Diagnosis present

## 2014-09-05 DIAGNOSIS — E871 Hypo-osmolality and hyponatremia: Secondary | ICD-10-CM | POA: Diagnosis present

## 2014-09-05 DIAGNOSIS — I4510 Unspecified right bundle-branch block: Secondary | ICD-10-CM | POA: Diagnosis present

## 2014-09-05 DIAGNOSIS — I509 Heart failure, unspecified: Secondary | ICD-10-CM

## 2014-09-05 DIAGNOSIS — R7989 Other specified abnormal findings of blood chemistry: Secondary | ICD-10-CM

## 2014-09-05 DIAGNOSIS — I825Z1 Chronic embolism and thrombosis of unspecified deep veins of right distal lower extremity: Secondary | ICD-10-CM

## 2014-09-05 DIAGNOSIS — I10 Essential (primary) hypertension: Secondary | ICD-10-CM | POA: Diagnosis present

## 2014-09-05 DIAGNOSIS — J9621 Acute and chronic respiratory failure with hypoxia: Secondary | ICD-10-CM | POA: Diagnosis present

## 2014-09-05 DIAGNOSIS — I5032 Chronic diastolic (congestive) heart failure: Secondary | ICD-10-CM | POA: Diagnosis present

## 2014-09-05 DIAGNOSIS — Z6837 Body mass index (BMI) 37.0-37.9, adult: Secondary | ICD-10-CM | POA: Diagnosis not present

## 2014-09-05 DIAGNOSIS — F32A Depression, unspecified: Secondary | ICD-10-CM | POA: Diagnosis present

## 2014-09-05 DIAGNOSIS — I5031 Acute diastolic (congestive) heart failure: Secondary | ICD-10-CM | POA: Diagnosis present

## 2014-09-05 DIAGNOSIS — Z888 Allergy status to other drugs, medicaments and biological substances status: Secondary | ICD-10-CM | POA: Diagnosis not present

## 2014-09-05 DIAGNOSIS — F329 Major depressive disorder, single episode, unspecified: Secondary | ICD-10-CM | POA: Diagnosis present

## 2014-09-05 DIAGNOSIS — I350 Nonrheumatic aortic (valve) stenosis: Secondary | ICD-10-CM

## 2014-09-05 DIAGNOSIS — I050 Rheumatic mitral stenosis: Secondary | ICD-10-CM | POA: Diagnosis present

## 2014-09-05 DIAGNOSIS — N390 Urinary tract infection, site not specified: Secondary | ICD-10-CM

## 2014-09-05 DIAGNOSIS — Z96643 Presence of artificial hip joint, bilateral: Secondary | ICD-10-CM | POA: Diagnosis present

## 2014-09-05 DIAGNOSIS — R531 Weakness: Secondary | ICD-10-CM | POA: Diagnosis not present

## 2014-09-05 DIAGNOSIS — G47419 Narcolepsy without cataplexy: Secondary | ICD-10-CM | POA: Diagnosis present

## 2014-09-05 DIAGNOSIS — I82509 Chronic embolism and thrombosis of unspecified deep veins of unspecified lower extremity: Secondary | ICD-10-CM | POA: Diagnosis present

## 2014-09-05 DIAGNOSIS — K2100 Gastro-esophageal reflux disease with esophagitis, without bleeding: Secondary | ICD-10-CM

## 2014-09-05 DIAGNOSIS — N179 Acute kidney failure, unspecified: Secondary | ICD-10-CM

## 2014-09-05 DIAGNOSIS — Z7901 Long term (current) use of anticoagulants: Secondary | ICD-10-CM | POA: Diagnosis not present

## 2014-09-05 DIAGNOSIS — Z79891 Long term (current) use of opiate analgesic: Secondary | ICD-10-CM | POA: Diagnosis not present

## 2014-09-05 DIAGNOSIS — I825Z9 Chronic embolism and thrombosis of unspecified deep veins of unspecified distal lower extremity: Secondary | ICD-10-CM | POA: Diagnosis present

## 2014-09-05 DIAGNOSIS — J9601 Acute respiratory failure with hypoxia: Secondary | ICD-10-CM | POA: Diagnosis not present

## 2014-09-05 DIAGNOSIS — Z880 Allergy status to penicillin: Secondary | ICD-10-CM

## 2014-09-05 DIAGNOSIS — R778 Other specified abnormalities of plasma proteins: Secondary | ICD-10-CM | POA: Diagnosis present

## 2014-09-05 DIAGNOSIS — Z87891 Personal history of nicotine dependence: Secondary | ICD-10-CM

## 2014-09-05 DIAGNOSIS — A419 Sepsis, unspecified organism: Secondary | ICD-10-CM | POA: Diagnosis present

## 2014-09-05 DIAGNOSIS — B9620 Unspecified Escherichia coli [E. coli] as the cause of diseases classified elsewhere: Secondary | ICD-10-CM | POA: Diagnosis present

## 2014-09-05 DIAGNOSIS — E669 Obesity, unspecified: Secondary | ICD-10-CM

## 2014-09-05 DIAGNOSIS — E1165 Type 2 diabetes mellitus with hyperglycemia: Secondary | ICD-10-CM | POA: Diagnosis present

## 2014-09-05 DIAGNOSIS — K760 Fatty (change of) liver, not elsewhere classified: Secondary | ICD-10-CM

## 2014-09-05 DIAGNOSIS — K219 Gastro-esophageal reflux disease without esophagitis: Secondary | ICD-10-CM | POA: Diagnosis present

## 2014-09-05 DIAGNOSIS — A4151 Sepsis due to Escherichia coli [E. coli]: Secondary | ICD-10-CM | POA: Diagnosis not present

## 2014-09-05 LAB — D-DIMER, QUANTITATIVE: D-Dimer, Quant: 0.54 ug/mL-FEU — ABNORMAL HIGH (ref 0.00–0.48)

## 2014-09-05 LAB — COMPREHENSIVE METABOLIC PANEL
ALT: 18 U/L (ref 14–54)
ANION GAP: 9 (ref 5–15)
AST: 18 U/L (ref 15–41)
Albumin: 3.3 g/dL — ABNORMAL LOW (ref 3.5–5.0)
Alkaline Phosphatase: 53 U/L (ref 38–126)
BUN: 28 mg/dL — AB (ref 6–20)
CALCIUM: 8.5 mg/dL — AB (ref 8.9–10.3)
CO2: 24 mmol/L (ref 22–32)
CREATININE: 1.38 mg/dL — AB (ref 0.44–1.00)
Chloride: 98 mmol/L — ABNORMAL LOW (ref 101–111)
GFR calc non Af Amer: 37 mL/min — ABNORMAL LOW (ref >60)
GFR, EST AFRICAN AMERICAN: 42 mL/min — AB (ref >60)
Glucose, Bld: 246 mg/dL — ABNORMAL HIGH (ref 65–99)
Potassium: 4.6 mmol/L (ref 3.5–5.1)
Sodium: 131 mmol/L — ABNORMAL LOW (ref 135–145)
Total Bilirubin: 0.8 mg/dL (ref 0.3–1.2)
Total Protein: 6.6 g/dL (ref 6.5–8.1)

## 2014-09-05 LAB — CBC WITH DIFFERENTIAL/PLATELET
BASOS PCT: 0 % (ref 0–1)
Basophils Absolute: 0 10*3/uL (ref 0.0–0.1)
Eosinophils Absolute: 0 10*3/uL (ref 0.0–0.7)
Eosinophils Relative: 0 % (ref 0–5)
HCT: 36.2 % (ref 36.0–46.0)
HEMOGLOBIN: 12.1 g/dL (ref 12.0–15.0)
Lymphocytes Relative: 5 % — ABNORMAL LOW (ref 12–46)
Lymphs Abs: 0.9 10*3/uL (ref 0.7–4.0)
MCH: 29 pg (ref 26.0–34.0)
MCHC: 33.4 g/dL (ref 30.0–36.0)
MCV: 86.8 fL (ref 78.0–100.0)
MONOS PCT: 13 % — AB (ref 3–12)
Monocytes Absolute: 2.5 10*3/uL — ABNORMAL HIGH (ref 0.1–1.0)
NEUTROS ABS: 16.1 10*3/uL — AB (ref 1.7–7.7)
Neutrophils Relative %: 82 % — ABNORMAL HIGH (ref 43–77)
PLATELETS: 210 10*3/uL (ref 150–400)
RBC: 4.17 MIL/uL (ref 3.87–5.11)
RDW: 13.9 % (ref 11.5–15.5)
WBC: 19.5 10*3/uL — ABNORMAL HIGH (ref 4.0–10.5)

## 2014-09-05 LAB — BLOOD GAS, ARTERIAL
ACID-BASE EXCESS: 2.3 mmol/L — AB (ref 0.0–2.0)
Bicarbonate: 25.6 mEq/L — ABNORMAL HIGH (ref 20.0–24.0)
Drawn by: 22223
O2 Content: 3 L/min
O2 Saturation: 91 %
PCO2 ART: 34.9 mmHg — AB (ref 35.0–45.0)
Patient temperature: 37
TCO2: 22.6 mmol/L (ref 0–100)
pH, Arterial: 7.479 — ABNORMAL HIGH (ref 7.350–7.450)
pO2, Arterial: 58.4 mmHg — ABNORMAL LOW (ref 80.0–100.0)

## 2014-09-05 LAB — URINALYSIS, ROUTINE W REFLEX MICROSCOPIC
Bilirubin Urine: NEGATIVE
Glucose, UA: NEGATIVE mg/dL
Nitrite: NEGATIVE
PH: 7 (ref 5.0–8.0)
Protein, ur: 100 mg/dL — AB
Specific Gravity, Urine: 1.02 (ref 1.005–1.030)
UROBILINOGEN UA: 0.2 mg/dL (ref 0.0–1.0)

## 2014-09-05 LAB — APTT: aPTT: 64 seconds — ABNORMAL HIGH (ref 24–37)

## 2014-09-05 LAB — URINE MICROSCOPIC-ADD ON

## 2014-09-05 LAB — TROPONIN I
TROPONIN I: 0.11 ng/mL — AB (ref <0.031)
Troponin I: 0.12 ng/mL — ABNORMAL HIGH (ref <0.031)

## 2014-09-05 LAB — MAGNESIUM: MAGNESIUM: 2.3 mg/dL (ref 1.7–2.4)

## 2014-09-05 LAB — PROTIME-INR
INR: 2.08 — ABNORMAL HIGH (ref 0.00–1.49)
PROTHROMBIN TIME: 23.3 s — AB (ref 11.6–15.2)

## 2014-09-05 LAB — PROCALCITONIN: PROCALCITONIN: 1.06 ng/mL

## 2014-09-05 LAB — LACTIC ACID, PLASMA: Lactic Acid, Venous: 1 mmol/L (ref 0.5–2.0)

## 2014-09-05 LAB — BRAIN NATRIURETIC PEPTIDE: B Natriuretic Peptide: 631 pg/mL — ABNORMAL HIGH (ref 0.0–100.0)

## 2014-09-05 MED ORDER — QUETIAPINE FUMARATE 100 MG PO TABS
100.0000 mg | ORAL_TABLET | Freq: Every day | ORAL | Status: DC
  Administered 2014-09-06 – 2014-09-08 (×4): 100 mg via ORAL
  Filled 2014-09-05 (×6): qty 1

## 2014-09-05 MED ORDER — FUROSEMIDE 10 MG/ML IJ SOLN
40.0000 mg | Freq: Once | INTRAMUSCULAR | Status: AC
  Administered 2014-09-05: 40 mg via INTRAVENOUS

## 2014-09-05 MED ORDER — DULOXETINE HCL 60 MG PO CPEP
60.0000 mg | ORAL_CAPSULE | Freq: Every day | ORAL | Status: DC
  Administered 2014-09-06 – 2014-09-09 (×4): 60 mg via ORAL
  Filled 2014-09-05 (×4): qty 1

## 2014-09-05 MED ORDER — DEXTROSE 5 % IV SOLN
INTRAVENOUS | Status: AC
  Filled 2014-09-05: qty 10

## 2014-09-05 MED ORDER — ACETAMINOPHEN 325 MG PO TABS
650.0000 mg | ORAL_TABLET | Freq: Four times a day (QID) | ORAL | Status: DC | PRN
  Administered 2014-09-06: 650 mg via ORAL
  Filled 2014-09-05: qty 2

## 2014-09-05 MED ORDER — POTASSIUM CHLORIDE CRYS ER 10 MEQ PO TBCR
10.0000 meq | EXTENDED_RELEASE_TABLET | Freq: Every day | ORAL | Status: DC
  Administered 2014-09-06 – 2014-09-09 (×4): 10 meq via ORAL
  Filled 2014-09-05 (×4): qty 1

## 2014-09-05 MED ORDER — CLOMIPRAMINE HCL 25 MG PO CAPS
25.0000 mg | ORAL_CAPSULE | Freq: Four times a day (QID) | ORAL | Status: DC
  Administered 2014-09-06 – 2014-09-09 (×14): 25 mg via ORAL
  Filled 2014-09-05 (×27): qty 1

## 2014-09-05 MED ORDER — DEXTROSE 5 % IV SOLN
1.0000 g | Freq: Once | INTRAVENOUS | Status: AC
  Administered 2014-09-05: 1 g via INTRAVENOUS
  Filled 2014-09-05: qty 10

## 2014-09-05 MED ORDER — ONDANSETRON HCL 4 MG/2ML IJ SOLN: 4.0000 mg | Freq: Four times a day (QID) | INTRAMUSCULAR | Status: DC | PRN

## 2014-09-05 MED ORDER — PANTOPRAZOLE SODIUM 40 MG PO TBEC
40.0000 mg | DELAYED_RELEASE_TABLET | Freq: Two times a day (BID) | ORAL | Status: DC
  Administered 2014-09-06 – 2014-09-09 (×7): 40 mg via ORAL
  Filled 2014-09-05 (×7): qty 1

## 2014-09-05 MED ORDER — QUETIAPINE FUMARATE 100 MG PO TABS
ORAL_TABLET | ORAL | Status: AC
  Filled 2014-09-05: qty 1

## 2014-09-05 MED ORDER — ACETAMINOPHEN 650 MG RE SUPP: 650.0000 mg | Freq: Four times a day (QID) | RECTAL | Status: DC | PRN

## 2014-09-05 MED ORDER — MAGNESIUM 200 MG PO TABS
600.0000 mg | ORAL_TABLET | Freq: Every day | ORAL | Status: DC
  Filled 2014-09-05 (×2): qty 3

## 2014-09-05 MED ORDER — FLORA-Q PO CAPS
ORAL_CAPSULE | Freq: Every day | ORAL | Status: DC
  Administered 2014-09-06 – 2014-09-09 (×4): 1 via ORAL
  Filled 2014-09-05 (×7): qty 1

## 2014-09-05 MED ORDER — ACETAMINOPHEN 325 MG PO TABS
650.0000 mg | ORAL_TABLET | Freq: Once | ORAL | Status: AC
  Administered 2014-09-05: 650 mg via ORAL
  Filled 2014-09-05: qty 2

## 2014-09-05 MED ORDER — MIRTAZAPINE 15 MG PO TABS
15.0000 mg | ORAL_TABLET | Freq: Every day | ORAL | Status: DC
  Administered 2014-09-06 – 2014-09-08 (×4): 15 mg via ORAL
  Filled 2014-09-05 (×6): qty 1

## 2014-09-05 MED ORDER — INSULIN ASPART 100 UNIT/ML ~~LOC~~ SOLN
0.0000 [IU] | Freq: Three times a day (TID) | SUBCUTANEOUS | Status: DC
  Administered 2014-09-06 (×2): 4 [IU] via SUBCUTANEOUS
  Administered 2014-09-06: 11 [IU] via SUBCUTANEOUS
  Administered 2014-09-07: 3 [IU] via SUBCUTANEOUS
  Administered 2014-09-07: 7 [IU] via SUBCUTANEOUS
  Administered 2014-09-07 – 2014-09-08 (×2): 4 [IU] via SUBCUTANEOUS
  Administered 2014-09-08: 3 [IU] via SUBCUTANEOUS
  Administered 2014-09-08 – 2014-09-09 (×2): 4 [IU] via SUBCUTANEOUS
  Administered 2014-09-09: 7 [IU] via SUBCUTANEOUS

## 2014-09-05 MED ORDER — ONDANSETRON HCL 4 MG PO TABS: 4.0000 mg | ORAL_TABLET | Freq: Four times a day (QID) | ORAL | Status: DC | PRN

## 2014-09-05 MED ORDER — SODIUM CHLORIDE 0.9 % IV SOLN
INTRAVENOUS | Status: DC
  Administered 2014-09-05 – 2014-09-08 (×5): via INTRAVENOUS

## 2014-09-05 MED ORDER — DEXTROSE 5 % IV SOLN
2.0000 g | INTRAVENOUS | Status: DC
  Administered 2014-09-06 – 2014-09-08 (×3): 2 g via INTRAVENOUS
  Filled 2014-09-05 (×6): qty 2

## 2014-09-05 MED ORDER — SODIUM CHLORIDE 0.9 % IV BOLUS (SEPSIS)
1000.0000 mL | INTRAVENOUS | Status: AC
  Administered 2014-09-05 – 2014-09-06 (×2): 1000 mL via INTRAVENOUS

## 2014-09-05 MED ORDER — SODIUM CHLORIDE 0.9 % IJ SOLN
3.0000 mL | Freq: Two times a day (BID) | INTRAMUSCULAR | Status: DC
  Administered 2014-09-07 – 2014-09-08 (×4): 3 mL via INTRAVENOUS

## 2014-09-05 MED ORDER — SODIUM CHLORIDE 0.9 % IV BOLUS (SEPSIS): 500.0000 mL | INTRAVENOUS | Status: AC

## 2014-09-05 MED ORDER — FUROSEMIDE 10 MG/ML IJ SOLN
INTRAMUSCULAR | Status: AC
  Administered 2014-09-05: 40 mg via INTRAVENOUS
  Filled 2014-09-05: qty 4

## 2014-09-05 MED ORDER — CLONAZEPAM 0.5 MG PO TABS
2.0000 mg | ORAL_TABLET | Freq: Two times a day (BID) | ORAL | Status: DC | PRN
  Administered 2014-09-06 – 2014-09-08 (×3): 2 mg via ORAL
  Filled 2014-09-05 (×3): qty 4

## 2014-09-05 MED ORDER — INSULIN ASPART 100 UNIT/ML ~~LOC~~ SOLN
0.0000 [IU] | Freq: Every day | SUBCUTANEOUS | Status: DC
  Administered 2014-09-05: 3 [IU] via SUBCUTANEOUS
  Administered 2014-09-06: 2 [IU] via SUBCUTANEOUS

## 2014-09-05 NOTE — ED Notes (Signed)
Assisted nurse with in and out. Patient tolerated well.

## 2014-09-05 NOTE — ED Notes (Signed)
Report given to Chinita Greenland, all questions answered.

## 2014-09-05 NOTE — ED Notes (Signed)
Increased SOB over last few days, stats 84% at EMS arrival. Patient placed on 4L [place] stats 98% on arrival to ED. [place] decreased to 2L, stats 96%

## 2014-09-05 NOTE — Progress Notes (Addendum)
Marland Kitchen ANTIBIOTIC CONSULT NOTE-Preliminary  Pharmacy Consult for Rocephin Indication: rule out pneumonia  Allergies  Allergen Reactions  . Codeine Anxiety  . Compazine Anaphylaxis  . Other Anaphylaxis, Rash and Other (See Comments)    Uncoded Allergy. Allergen: INOVAR Uncoded Allergy. Allergen: ALL ADHESIVES Uncoded Allergy. Allergen: SILK SUTURE Uncoded Allergy. Allergen: merthiolate Thermasol  . Penicillins Shortness Of Breath and Rash  . Morphine And Related Other (See Comments)    Patient states that it makes her lose her state of mind.     Patient Measurements: Height:  (172.7 cm) Weight: 246 lb 4.1 oz (111.7 kg) IBW/kg (Calculated) : 63.9  Vital Signs: Temp: 101.1 F (38.4 C) (08/01 1943) Temp Source: Rectal (08/01 1943) BP: 89/38 mmHg (08/01 2215) Pulse Rate: 71 (08/01 2215)  Labs:  Recent Labs  09/05/14 1913  WBC 19.5*  HGB 12.1  PLT 210  CREATININE 1.38*    Estimated Creatinine Clearance: 46.9 mL/min (by C-G formula based on Cr of 1.38).  No results for input(s): VANCOTROUGH, VANCOPEAK, VANCORANDOM, GENTTROUGH, GENTPEAK, GENTRANDOM, TOBRATROUGH, TOBRAPEAK, TOBRARND, AMIKACINPEAK, AMIKACINTROU, AMIKACIN in the last 72 hours.   Microbiology: No results found for this or any previous visit (from the past 720 hour(s)).  Medical History: Past Medical History  Diagnosis Date  . Diabetes mellitus   . DVT (deep venous thrombosis)     multiple  . Neuropathy   . Narcolepsy   . Cataplexy   . GERD (gastroesophageal reflux disease)   . Hiatal hernia   . Hypertension   . Shortness of breath     Medications:  Prescriptions prior to admission  Medication Sig Dispense Refill Last Dose  . Ascorbic Acid (VITAMIN C WITH ROSE HIPS) 500 MG tablet Take 500 mg by mouth daily.   Past Week at Unknown time  . benazepril-hydrochlorthiazide (LOTENSIN HCT) 20-25 MG per tablet Take 1 tablet by mouth 2 (two) times daily.    09/05/2014 at Unknown time  . Biotin (BIOTIN  MAXIMUM STRENGTH) 10 MG TABS Take 1 tablet by mouth daily.   Past Week at Unknown time  . celecoxib (CELEBREX) 200 MG capsule Take 200 mg by mouth daily as needed. For arthritis   09/05/2014 at Unknown time  . cholecalciferol (VITAMIN D) 1000 UNITS tablet Take 1,000 Units by mouth daily.   Past Week at Unknown time  . Cholecalciferol (VITAMIN D3) 3000 UNITS TABS Take 1 capsule by mouth daily.   09/05/2014 at Unknown time  . clomiPRAMINE (ANAFRANIL) 25 MG capsule Take 25 mg by mouth 4 (four) times daily.    09/05/2014 at Unknown time  . clonazePAM (KLONOPIN) 2 MG tablet Take 2 mg by mouth at bedtime.   09/04/2014 at Unknown time  . Coenzyme Q10 (CO Q10) 200 MG CAPS Take 1 capsule by mouth daily.   Past Week at Unknown time  . diltiazem (CARDIZEM CD) 180 MG 24 hr capsule Take 2 capsules (360 mg total) by mouth daily. 30 capsule 3 09/05/2014 at Unknown time  . DULoxetine (CYMBALTA) 60 MG capsule Take 60 mg by mouth daily.     09/05/2014 at Unknown time  . Ferrous Fumarate (FERROCITE) 324 (106 FE) MG TABS Take 1 capsule by mouth 3 (three) times a week.   Past Week at Unknown time  . Hyaluronic Acid-Vitamin C (HYALURONIC ACID PO) Take 100 mg by mouth daily.   Past Week at Unknown time  . insulin glargine (LANTUS) 100 UNIT/ML injection Inject 36 Units into the skin at bedtime.  09/04/2014 at Unknown time  . Magnesium 300 MG CAPS Take 600 mg by mouth daily.   09/04/2014 at Unknown time  . metFORMIN (GLUCOPHAGE) 500 MG tablet Take 500 mg by mouth 2 (two) times daily with a meal.     09/05/2014 at Unknown time  . mirtazapine (REMERON) 15 MG tablet Take 15 mg by mouth at bedtime.     09/04/2014 at Unknown time  . omeprazole (PRILOSEC) 20 MG capsule TAKE 1 CAPSULE BY MOUTH TWICE DAILY FOR ACID REFLUX 60 capsule 5 09/05/2014 at Unknown time  . oxyCODONE (OXY IR/ROXICODONE) 5 MG immediate release tablet Take 5 mg by mouth 2 (two) times daily as needed. For pain  0 09/05/2014 at Unknown time  . potassium chloride (MICRO-K) 10 MEQ  CR capsule Take 10 mEq by mouth 4 (four) times daily.    09/04/2014 at Unknown time  . Probiotic Product (PROBIOTIC ADVANCED PO) Take 1 capsule by mouth daily.   Past Week at Unknown time  . QUEtiapine (SEROQUEL) 100 MG tablet Take 100 mg by mouth at bedtime.   09/04/2014 at Unknown time  . RA KRILL OIL 500 MG CAPS Take 1 capsule by mouth daily.   Past Week at Unknown time  . saxagliptin HCl (ONGLYZA) 2.5 MG TABS tablet Take 5 mg by mouth daily.   09/05/2014 at Unknown time  . Sennosides 25 MG TABS Take 25 mg by mouth daily.   09/05/2014 at Unknown time  . warfarin (COUMADIN) 5 MG tablet See written post operative Coumadin instructions. (Patient taking differently: Take 2.5-5 mg by mouth every morning. Alternate taking 5mg  with 2.5mg  daily) 1 tablet 0 09/05/2014 at 500a    Assessment: Okay for Protocol, PCN allergy noted has receive Rocephin in the past per EMR records.  Obese patient.  Current height and weight pending.  Goal of Therapy:  Eradicate infection.   Plan:  Preliminary review of pertinent patient information completed.  Protocol will be initiated with a one-time dose(s) of Rocephin 1gm (total 2gm for obese patient.)  Jeani Hawking clinical pharmacist will complete review during morning rounds to assess patient and finalize treatment regimen.  Rocephin 2gm IV every 24 hours ordered.  Mady Gemma, [facility] 09/05/2014,10:51 PM

## 2014-09-05 NOTE — ED Provider Notes (Signed)
CSN: [id number]     Arrival date & time 09/05/14  1738 History   First MD Initiated Contact with Patient 09/05/14 1740     Chief Complaint  Patient presents with  . Shortness of Breath     (Consider location/radiation/quality/duration/timing/severity/associated sxs/prior Treatment) HPI   Rebecca [name] is a 74 y.o. female patient with shortness of breath since last night, that prevented her from sleeping. She is more sleepy today because she did not sleep well last night. She has chronic narcolepsy and typically falls asleep when she is sitting down. She is also being weaker than usual, having more difficulty helping herself at her home. She was hypoxic, when EMS arrived to her home after they were called. She's been taking her usual medications. She has a nonproductive cough. She is not being treated for sleep apnea. She saw her PCP for checkup. Last weekend, "everything was okay". No recent fever, chills, chest pain, abdominal pain, back pain, or change in bowel or urinary habits. There are no other known modifying factors.  Nursing excerpt- Increased SOB over last few days, stats 84% at EMS arrival. Patient placed on 4L [place] stats 98% on arrival to ED. [place] decreased to 2L, stats 96%   Past Medical History  Diagnosis Date  . Diabetes mellitus   . DVT (deep venous thrombosis)     multiple  . Neuropathy   . Narcolepsy   . Cataplexy   . GERD (gastroesophageal reflux disease)   . Hiatal hernia   . Hypertension   . Shortness of breath    Past Surgical History  Procedure Laterality Date  . Abdominal hysterectomy    . Hemorroidectomy    . Carpal tunnel release    . Cholecystectomy    . Kidney surgery    . Total hip arthroplasty      bilateral  . Nephrostomy    . Carpectomy  12/26/2011    Procedure: CARPECTOMY;  Surgeon: Wyn Forster., MD;  Location: [facility];  Service: Orthopedics;  Laterality: Right;  PROXIMAL ROW CARPECTOMY RIGHT WRIST and neurectomy   Family  History  Problem Relation Age of Onset  . Heart disease    . Diabetes     History  Substance Use Topics  . Smoking status: Former Smoker    Quit date: 10/13/1988  . Smokeless tobacco: Never Used  . Alcohol Use: No   OB History    No data available     Review of Systems  All other systems reviewed and are negative.     Allergies  Codeine; Compazine; Other; Penicillins; and Morphine and related  Home Medications   Prior to Admission medications   Medication Sig Start Date End Date Taking? Authorizing Provider  Ascorbic Acid (VITAMIN C WITH ROSE HIPS) 500 MG tablet Take 500 mg by mouth daily.   Yes Historical Provider, MD  benazepril-hydrochlorthiazide (LOTENSIN HCT) 20-25 MG per tablet Take 1 tablet by mouth 2 (two) times daily.    Yes Historical Provider, MD  Biotin (BIOTIN MAXIMUM STRENGTH) 10 MG TABS Take 1 tablet by mouth daily.   Yes Historical Provider, MD  celecoxib (CELEBREX) 200 MG capsule Take 200 mg by mouth daily as needed. For arthritis   Yes Historical Provider, MD  cholecalciferol (VITAMIN D) 1000 UNITS tablet Take 1,000 Units by mouth daily.   Yes Historical Provider, MD  Cholecalciferol (VITAMIN D3) 3000 UNITS TABS Take 1 capsule by mouth daily.   Yes Historical Provider, MD  clomiPRAMINE (ANAFRANIL)  25 MG capsule Take 25 mg by mouth 4 (four) times daily.    Yes Historical Provider, MD  clonazePAM (KLONOPIN) 2 MG tablet Take 2 mg by mouth at bedtime.   Yes Historical Provider, MD  Coenzyme Q10 (CO Q10) 200 MG CAPS Take 1 capsule by mouth daily.   Yes Historical Provider, MD  diltiazem (CARDIZEM CD) 180 MG 24 hr capsule Take 2 capsules (360 mg total) by mouth daily. 10/30/10  Yes [name], MD  DULoxetine (CYMBALTA) 60 MG capsule Take 60 mg by mouth daily.     Yes Historical Provider, MD  Ferrous Fumarate (FERROCITE) 324 (106 FE) MG TABS Take 1 capsule by mouth 3 (three) times a week.   Yes Historical Provider, MD  Hyaluronic Acid-Vitamin C (HYALURONIC  ACID PO) Take 100 mg by mouth daily.   Yes Historical Provider, MD  insulin glargine (LANTUS) 100 UNIT/ML injection Inject 36 Units into the skin at bedtime.    Yes Historical Provider, MD  Magnesium 300 MG CAPS Take 600 mg by mouth daily.   Yes Historical Provider, MD  metFORMIN (GLUCOPHAGE) 500 MG tablet Take 500 mg by mouth 2 (two) times daily with a meal.     Yes Historical Provider, MD  mirtazapine (REMERON) 15 MG tablet Take 15 mg by mouth at bedtime.     Yes Historical Provider, MD  omeprazole (PRILOSEC) 20 MG capsule TAKE 1 CAPSULE BY MOUTH TWICE DAILY FOR ACID REFLUX 07/14/12  Yes Malissa Hippo, MD  oxyCODONE (OXY IR/ROXICODONE) 5 MG immediate release tablet Take 5 mg by mouth 2 (two) times daily as needed. For pain 08/26/14  Yes Historical Provider, MD  potassium chloride (MICRO-K) 10 MEQ CR capsule Take 10 mEq by mouth 4 (four) times daily.    Yes Historical Provider, MD  Probiotic Product (PROBIOTIC ADVANCED PO) Take 1 capsule by mouth daily.   Yes Historical Provider, MD  QUEtiapine (SEROQUEL) 100 MG tablet Take 100 mg by mouth at bedtime.   Yes Historical Provider, MD  RA KRILL OIL 500 MG CAPS Take 1 capsule by mouth daily.   Yes Historical Provider, MD  saxagliptin HCl (ONGLYZA) 2.5 MG TABS tablet Take 5 mg by mouth daily.   Yes Historical Provider, MD  Sennosides 25 MG TABS Take 25 mg by mouth daily.   Yes Historical Provider, MD  warfarin (COUMADIN) 5 MG tablet See written post operative Coumadin instructions. Patient taking differently: Take 2.5-5 mg by mouth every morning. Alternate taking 5mg  with 2.5mg  daily 12/26/11  Yes Josephine Igo, MD   BP 89/38 mmHg  Pulse 71  Temp(Src) 98.7 F (37.1 C) (Oral)  Resp 19  Ht 5\' 8"  (1.727 m)  Wt 246 lb 4.1 oz (111.7 kg)  BMI 37.45 kg/m2  SpO2 94% Physical Exam  Constitutional: She is oriented to person, place, and time. She appears well-developed. She appears distressed (She is uncomfortable).  Elderly, obese  HENT:  Head:  Normocephalic and atraumatic.  Right Ear: External ear normal.  Left Ear: External ear normal.  Eyes: Conjunctivae and EOM are normal. Pupils are equal, round, and reactive to light.  Neck: Normal range of motion and phonation normal. Neck supple.  Cardiovascular: Normal rate, regular rhythm and normal heart sounds.   Pulmonary/Chest: Effort normal. No respiratory distress. She has no wheezes. She has no rales. She exhibits no bony tenderness.  Decreased breath sounds bilaterally.  Abdominal: Soft. There is no tenderness.  Musculoskeletal: Normal range of motion. She exhibits edema (Bilateral lower extremity).  Neurological: She is alert and oriented to person, place, and time. No cranial nerve deficit or sensory deficit. She exhibits normal muscle tone. Coordination normal.  Global weakness prevents her from pulling herself up while lying supine. No asymmetry of strength.  Skin: Skin is warm, dry and intact.  Psychiatric: She has a normal mood and affect. Her behavior is normal. Judgment and thought content normal.  Nursing note and vitals reviewed.   ED Course  Procedures (including critical care time) Medications  clomiPRAMINE (ANAFRANIL) capsule 25 mg (not administered)  clonazePAM (KLONOPIN) tablet 2 mg (not administered)  Magnesium TABS 600 mg (not administered)  FLORA-Q (FLORA-Q) Capsule (not administered)  QUEtiapine (SEROQUEL) tablet 100 mg (not administered)  pantoprazole (PROTONIX) EC tablet 40 mg (not administered)  DULoxetine (CYMBALTA) DR capsule 60 mg (not administered)  potassium chloride SA (K-DUR,KLOR-CON) CR tablet 10 mEq (not administered)  mirtazapine (REMERON) tablet 15 mg (not administered)  sodium chloride 0.9 % bolus 1,000 mL (1,000 mLs Intravenous Given 09/05/14 2324)    Followed by  sodium chloride 0.9 % bolus 500 mL (not administered)  insulin aspart (novoLOG) injection 0-20 Units (not administered)  insulin aspart (novoLOG) injection 0-5 Units (3 Units  Subcutaneous Given 09/05/14 2323)  sodium chloride 0.9 % injection 3 mL (3 mLs Intravenous Not Given 09/05/14 2245)  0.9 %  sodium chloride infusion ( Intravenous New Bag/Given 09/05/14 2323)  acetaminophen (TYLENOL) tablet 650 mg (not administered)    Or  acetaminophen (TYLENOL) suppository 650 mg (not administered)  ondansetron (ZOFRAN) tablet 4 mg (not administered)    Or  ondansetron (ZOFRAN) injection 4 mg (not administered)  cefTRIAXone (ROCEPHIN) 1 g in dextrose 5 % 50 mL IVPB (1 g Intravenous Given 09/05/14 2330)  cefTRIAXone (ROCEPHIN) 2 g in dextrose 5 % 50 mL IVPB (not administered)  acetaminophen (TYLENOL) tablet 650 mg (650 mg Oral Given 09/05/14 2111)  furosemide (LASIX) injection 40 mg (40 mg Intravenous Given 09/05/14 2218)  cefTRIAXone (ROCEPHIN) 1 g in dextrose 5 % 50 mL IVPB (1 g Intravenous New Bag/Given 09/05/14 2219)    Patient Vitals for the past 24 hrs:  BP Temp Temp src Pulse Resp SpO2 Height Weight  09/05/14 2258 - 98.7 F (37.1 C) Oral - - - - -  09/05/14 2215 (!) 89/38 mmHg - - 71 19 94 % 5\' 8"  (1.727 m) 246 lb 4.1 oz (111.7 kg)  09/05/14 2200 (!) 98/39 mmHg - - 71 23 95 % - -  09/05/14 2145 (!) 102/44 mmHg - - 72 (!) 32 95 % - -  09/05/14 2140 (!) 110/50 mmHg - - 72 (!) 27 95 % - -  09/05/14 2015 (!) 102/52 mmHg - - 70 (!) 32 92 % - -  09/05/14 2000 (!) 116/45 mmHg - - 72 20 91 % - -  09/05/14 1945 (!) 121/36 mmHg - - 70 25 92 % - -  09/05/14 1943 - 101.1 F (38.4 C) Rectal - - - - -  09/05/14 1935 - 100.8 F (38.2 C) Axillary - - - - -  09/05/14 1930 (!) 128/45 mmHg - - 76 (!) 29 92 % - -  09/05/14 1915 (!) 134/44 mmHg - - 73 25 91 % - -  09/05/14 1900 (!) 132/49 mmHg - - 74 26 (!) 89 % - -  09/05/14 1845 (!) 129/51 mmHg - - 75 22 93 % - -  09/05/14 1830 (!) 128/47 mmHg - - 75 25 91 % - -  09/05/14 1815 Marland Kitchen)  134/48 mmHg - - 75 21 90 % - -  09/05/14 1800 (!) 137/45 mmHg - - 76 (!) 32 (!) 89 % - -  09/05/14 1745 (!) 142/52 mmHg 97.4 F (36.3 C) Oral 77 18 94 % - -     9:48 PM Reevaluation with update and discussion. After initial assessment and treatment, an updated evaluation reveals she continues to deny chest pain. Patient's daughter states that she looks and feels better at this time. The patient is alert, calm and cooperative. I discussed with family and patient, all questions answered.Mancel Bale L   9:50 PM-Consult complete with Dr. Konrad Dolores. Patient case explained and discussed. He agrees to admit patient for further evaluation and treatment. Call ended at 2153  CRITICAL CARE Performed by: Flint Melter Total critical care time: 35 minutes Critical care time was exclusive of separately billable procedures and treating other patients. Critical care was necessary to treat or prevent imminent or life-threatening deterioration. Critical care was time spent personally by me on the following activities: development of treatment plan with patient and/or surrogate as well as nursing, discussions with consultants, evaluation of patient's response to treatment, examination of patient, obtaining history from patient or surrogate, ordering and performing treatments and interventions, ordering and review of laboratory studies, ordering and review of radiographic studies, pulse oximetry and re-evaluation of patient's condition.   Labs Review Labs Reviewed  COMPREHENSIVE METABOLIC PANEL - Abnormal; Notable for the following:    Sodium 131 (*)    Chloride 98 (*)    Glucose, Bld 246 (*)    BUN 28 (*)    Creatinine, Ser 1.38 (*)    Calcium 8.5 (*)    Albumin 3.3 (*)    GFR calc non Af Amer 37 (*)    GFR calc Af Amer 42 (*)    All other components within normal limits  CBC WITH DIFFERENTIAL/PLATELET - Abnormal; Notable for the following:    WBC 19.5 (*)    Neutrophils Relative % 82 (*)    Neutro Abs 16.1 (*)    Lymphocytes Relative 5 (*)    Monocytes Relative 13 (*)    Monocytes Absolute 2.5 (*)    All other components within normal limits   D-DIMER, QUANTITATIVE (NOT AT [facility]) - Abnormal; Notable for the following:    D-Dimer, Quant 0.54 (*)    All other components within normal limits  BLOOD GAS, ARTERIAL - Abnormal; Notable for the following:    pH, Arterial 7.479 (*)    pCO2 arterial 34.9 (*)    pO2, Arterial 58.4 (*)    Bicarbonate 25.6 (*)    Acid-Base Excess 2.3 (*)    All other components within normal limits  URINALYSIS, ROUTINE W REFLEX MICROSCOPIC (NOT AT [facility]) - Abnormal; Notable for the following:    APPearance HAZY (*)    Hgb urine dipstick LARGE (*)    Ketones, ur TRACE (*)    Protein, ur 100 (*)    Leukocytes, UA MODERATE (*)    All other components within normal limits  TROPONIN I - Abnormal; Notable for the following:    Troponin I 0.11 (*)    All other components within normal limits  BRAIN NATRIURETIC PEPTIDE - Abnormal; Notable for the following:    B Natriuretic Peptide 631.0 (*)    All other components within normal limits  URINE MICROSCOPIC-ADD ON - Abnormal; Notable for the following:    Squamous Epithelial / LPF MANY (*)    Bacteria, UA MANY (*)  All other components within normal limits  TROPONIN I - Abnormal; Notable for the following:    Troponin I 0.12 (*)    All other components within normal limits  PROTIME-INR - Abnormal; Notable for the following:    Prothrombin Time 23.3 (*)    INR 2.08 (*)    All other components within normal limits  APTT - Abnormal; Notable for the following:    aPTT 64 (*)    All other components within normal limits  CULTURE, BLOOD (ROUTINE X 2)  CULTURE, BLOOD (ROUTINE X 2)  URINE CULTURE  URINE CULTURE  MRSA PCR SCREENING  LACTIC ACID, PLASMA  PROCALCITONIN  MAGNESIUM  LACTIC ACID, PLASMA  HEMOGLOBIN A1C  BLOOD GAS, ARTERIAL  TROPONIN I  TROPONIN I  PROTIME-INR  TROPONIN I    Imaging Review Dg Chest Port 1 View  09/05/2014   CLINICAL DATA:  Shortness of breath since yesterday.  EXAM: PORTABLE CHEST - 1 VIEW  COMPARISON:  10/29/2010   FINDINGS: Limited examination due to body habitus and low lung volumes. The heart is enlarged. The mediastinal and hilar contours are prominent. Low lung volumes with vascular crowding and atelectasis. Could not exclude mild pulmonary edema. No large pleural effusions.  IMPRESSION: Limited examination due to body habitus.  Low lung volumes with vascular congestion, vascular crowding and atelectasis. Could not exclude mild edema.   Electronically Signed   By: Rudie Meyer M.D.   On: 09/05/2014 19:09     EKG Interpretation   Date/Time:  Monday September 05 2014 17:39:13 EDT Ventricular Rate:  80 PR Interval:  186 QRS Duration: 129 QT Interval:  397 QTC Calculation: 458 R Axis:   -93 Text Interpretation:  Sinus rhythm Atrial premature complexes in couplets  Right bundle branch block Inferior infarct, old Since last tracing Right  bundle branch block is new Confirmed by Effie Shy  MD, Jowell Bossi [phone]) on  09/05/2014 5:41:48 PM      MDM   Final diagnoses:  Weakness  Urinary tract infection without hematuria, site unspecified  Congestive heart failure, unspecified congestive heart failure chronicity, unspecified congestive heart failure type    Primary presenting complaint is shortness of breath, with hypoxia present. This is likely related to mild congestive heart failure. She does not have known cardiac disease, and does not use BiPAP. She has never had a cardiac echo. She has a normal age related d-dimer, and I do not suspect a PE. Mild elevation of troponin, without chest pain. This is likely related to her dyspnea. She will require hospitalization, close monitoring, diuresis, and cardiology consultation in the morning. Delta troponin, ordered for 10:15 p.m.   Nursing Notes Reviewed/ Care Coordinated, and agree without changes. Applicable Imaging Reviewed.  Interpretation of Laboratory Data incorporated into ED treatment   Plan: Admit to stepdown unit   Mancel Bale, MD 09/05/14 2350

## 2014-09-05 NOTE — ED Notes (Signed)
Called to room by visitor who stated patient felt hot. Aux temp 100.8; pt recently had ice water

## 2014-09-05 NOTE — H&P (Addendum)
[facility] History and Physical  AYLIN RHOADS ZOX:[id number] DOB: Jul 02, 1940 DOA: 09/05/2014  Referring physician: Dr. Effie Shy - [facility] PCP: Isabella Stalling, MD   Chief Complaint: SOB  HPI: Rebecca [name] is a 74 y.o. female   Level V caveat: Patient presenting with some somnolence confusion. History provided by patient and multiple family members.  Shortness of breath. Started 1 day ago. Getting worse. Fairly constant. Associated with nonproductive cough increased fatigue and decreased sleep overnight. Of note, Patient has chronic severe narcolepsy. Patient is a resident at a nursing home and EMS was called to evaluate her. At time of EMS arrival patient's O2 sats were 84% she states on 4 L nasal cannula and O2 sats rose to 98%. No recent changes in medications or daily activities. She reports that her routine PCP checkup on week ago was normal. Patient has not tried anything for her symptoms. Nothing other than resting makes her symptoms better. Denies dysuria, frequency, fevers, nausea, vomiting, lower extremity swelling.   Review of Systems:  Patient presenting in altered mental state and unable to give reliable review of systems the on what is mentioned in history of present illness.  Past Medical History  Diagnosis Date  . Diabetes mellitus   . DVT (deep venous thrombosis)     multiple  . Neuropathy   . Narcolepsy   . Cataplexy   . GERD (gastroesophageal reflux disease)   . Hiatal hernia   . Hypertension   . Shortness of breath    Past Surgical History  Procedure Laterality Date  . Abdominal hysterectomy    . Hemorroidectomy    . Carpal tunnel release    . Cholecystectomy    . Kidney surgery    . Total hip arthroplasty      bilateral  . Nephrostomy    . Carpectomy  12/26/2011    Procedure: CARPECTOMY;  Surgeon: Wyn Forster., MD;  Location: [facility];  Service: Orthopedics;  Laterality: Right;  PROXIMAL ROW CARPECTOMY RIGHT WRIST and  neurectomy   Social History:  reports that she quit smoking about 25 years ago. She has never used smokeless tobacco. She reports that she does not drink alcohol or use illicit drugs.  Allergies  Allergen Reactions  . Codeine Anxiety  . Compazine Anaphylaxis  . Other Anaphylaxis, Rash and Other (See Comments)    Uncoded Allergy. Allergen: INOVAR Uncoded Allergy. Allergen: ALL ADHESIVES Uncoded Allergy. Allergen: SILK SUTURE Uncoded Allergy. Allergen: merthiolate Thermasol  . Penicillins Shortness Of Breath and Rash  . Morphine And Related Other (See Comments)    Patient states that it makes her lose her state of mind.     Family History  Problem Relation Age of Onset  . Heart disease    . Diabetes       Prior to Admission medications   Medication Sig Start Date End Date Taking? Authorizing Provider  Ascorbic Acid (VITAMIN C WITH ROSE HIPS) 500 MG tablet Take 500 mg by mouth daily.   Yes Historical Provider, MD  benazepril-hydrochlorthiazide (LOTENSIN HCT) 20-25 MG per tablet Take 1 tablet by mouth 2 (two) times daily.    Yes Historical Provider, MD  Biotin (BIOTIN MAXIMUM STRENGTH) 10 MG TABS Take 1 tablet by mouth daily.   Yes Historical Provider, MD  celecoxib (CELEBREX) 200 MG capsule Take 200 mg by mouth daily as needed. For arthritis   Yes Historical Provider, MD  cholecalciferol (VITAMIN D) 1000 UNITS tablet Take 1,000 Units by mouth daily.  Yes Historical Provider, MD  Cholecalciferol (VITAMIN D3) 3000 UNITS TABS Take 1 capsule by mouth daily.   Yes Historical Provider, MD  clomiPRAMINE (ANAFRANIL) 25 MG capsule Take 25 mg by mouth 4 (four) times daily.    Yes Historical Provider, MD  clonazePAM (KLONOPIN) 2 MG tablet Take 2 mg by mouth at bedtime.   Yes Historical Provider, MD  Coenzyme Q10 (CO Q10) 200 MG CAPS Take 1 capsule by mouth daily.   Yes Historical Provider, MD  diltiazem (CARDIZEM CD) 180 MG 24 hr capsule Take 2 capsules (360 mg total) by mouth daily. 10/30/10   Yes [name], MD  DULoxetine (CYMBALTA) 60 MG capsule Take 60 mg by mouth daily.     Yes Historical Provider, MD  Ferrous Fumarate (FERROCITE) 324 (106 FE) MG TABS Take 1 capsule by mouth 3 (three) times a week.   Yes Historical Provider, MD  Hyaluronic Acid-Vitamin C (HYALURONIC ACID PO) Take 100 mg by mouth daily.   Yes Historical Provider, MD  insulin glargine (LANTUS) 100 UNIT/ML injection Inject 36 Units into the skin at bedtime.    Yes Historical Provider, MD  Magnesium 300 MG CAPS Take 600 mg by mouth daily.   Yes Historical Provider, MD  metFORMIN (GLUCOPHAGE) 500 MG tablet Take 500 mg by mouth 2 (two) times daily with a meal.     Yes Historical Provider, MD  mirtazapine (REMERON) 15 MG tablet Take 15 mg by mouth at bedtime.     Yes Historical Provider, MD  omeprazole (PRILOSEC) 20 MG capsule TAKE 1 CAPSULE BY MOUTH TWICE DAILY FOR ACID REFLUX 07/14/12  Yes Malissa Hippo, MD  oxyCODONE (OXY IR/ROXICODONE) 5 MG immediate release tablet Take 5 mg by mouth 2 (two) times daily as needed. For pain 08/26/14  Yes Historical Provider, MD  potassium chloride (MICRO-K) 10 MEQ CR capsule Take 10 mEq by mouth 4 (four) times daily.    Yes Historical Provider, MD  Probiotic Product (PROBIOTIC ADVANCED PO) Take 1 capsule by mouth daily.   Yes Historical Provider, MD  QUEtiapine (SEROQUEL) 100 MG tablet Take 100 mg by mouth at bedtime.   Yes Historical Provider, MD  RA KRILL OIL 500 MG CAPS Take 1 capsule by mouth daily.   Yes Historical Provider, MD  saxagliptin HCl (ONGLYZA) 2.5 MG TABS tablet Take 5 mg by mouth daily.   Yes Historical Provider, MD  Sennosides 25 MG TABS Take 25 mg by mouth daily.   Yes Historical Provider, MD  warfarin (COUMADIN) 5 MG tablet See written post operative Coumadin instructions. Patient taking differently: Take 2.5-5 mg by mouth every morning. Alternate taking 5mg  with 2.5mg  daily 12/26/11  Yes Josephine Igo, MD   Physical Exam: Filed Vitals:   09/05/14 2140  09/05/14 2145 09/05/14 2200 09/05/14 2215  BP: 110/50 102/44 98/39 89/38   Pulse: 72 72 71 71  Temp:      TempSrc:      Resp: 27 32 23 19  SpO2: 95% 95% 95% 94%    Wt Readings from Last 3 Encounters:  07/29/12 112.583 kg (248 lb 3.2 oz)  12/26/11 114.76 kg (253 lb)  10/30/11 114.76 kg (253 lb)    General: Ill-appearing Eyes:  PERRL, normal lids, irises & conjunctiva ENT: Dry mucous membranes Neck:  no LAD, masses or thyromegaly Cardiovascular:  RRR, III/VI systolic murmur. Trace LE edema. Respiratory: Bilateral crackles in the bases, mildly increased effort, no wheezes or rhonchi, on 2 L nasal cannula Abdomen:  soft, ntnd Skin: Warm  to the touch, no rash or induration seen on limited exam Musculoskeletal: Left flank CVA tenderness. grossly normal tone BUE/BLE Psychiatric:  Answers most questions appropriately, patient very fatigued in her speech, patient expressing some confusion over symptom onset and progression which require clarification by family.  Neurologic:  Cranial nerves II through XII grossly intact, moves all extremity coordinated fashion, .          Labs on Admission:  Basic Metabolic Panel:  Recent Labs Lab 09/05/14 1913  NA 131*  K 4.6  CL 98*  CO2 24  GLUCOSE 246*  BUN 28*  CREATININE 1.38*  CALCIUM 8.5*   Liver Function Tests:  Recent Labs Lab 09/05/14 1913  AST 18  ALT 18  ALKPHOS 53  BILITOT 0.8  PROT 6.6  ALBUMIN 3.3*   No results for input(s): LIPASE, AMYLASE in the last 168 hours. No results for input(s): AMMONIA in the last 168 hours. CBC:  Recent Labs Lab 09/05/14 1913  WBC 19.5*  NEUTROABS 16.1*  HGB 12.1  HCT 36.2  MCV 86.8  PLT 210   Cardiac Enzymes:  Recent Labs Lab 09/05/14 1913  TROPONINI 0.11*    BNP (last 3 results)  Recent Labs  09/05/14 1913  BNP 631.0*    ProBNP (last 3 results) No results for input(s): PROBNP in the last 8760 hours.  CBG: No results for input(s): GLUCAP in the last 168  hours.  Radiological Exams on Admission: Dg Chest Port 1 View  09/05/2014   CLINICAL DATA:  Shortness of breath since yesterday.  EXAM: PORTABLE CHEST - 1 VIEW  COMPARISON:  10/29/2010  FINDINGS: Limited examination due to body habitus and low lung volumes. The heart is enlarged. The mediastinal and hilar contours are prominent. Low lung volumes with vascular crowding and atelectasis. Could not exclude mild pulmonary edema. No large pleural effusions.  IMPRESSION: Limited examination due to body habitus.  Low lung volumes with vascular congestion, vascular crowding and atelectasis. Could not exclude mild edema.   Electronically Signed   By: Rudie Meyer M.D.   On: 09/05/2014 19:09     Assessment/Plan Principal Problem:   Sepsis Active Problems:   Hypoxia   Elevated troponin   Acute respiratory failure   Lower leg DVT (deep venous thromboembolism), chronic   Essential hypertension   Depression   AKI (acute kidney injury)   Poorly controlled diabetes mellitus   Hypomagnesemia   Hyponatremia   Pyelonephritis   Sepsis: Likely secondary to UTI/pyelonephritis versus respiratory etiology (unlikely). UTI grossly abnormal with left CVA tenderness and fevers, and hypotension.  - Stepdown - Lactic acid - IVF - bolus - continue CTX - BCX, UCX  Acute respiratory failure: Likely multifactorial including pulmonary congestion from new onset CHF, versus early pneumonia versus obesity hypoventilation. ABG (question VBG) pH 7.48, PCO2 34.9, PO2 58.4, bicarbonate 25.6. New to requirement. D-dimer normal given age variation. On Coumadin for chronic DVTs. - ABX as above - O2 when necessary - Consider CT if not improving - ABG in a.m.  CHF: Likely new diagnoses. BNP 631 not overly impressive given other presenting symptoms requires further workup. Unable to diurese at this time given sepsis and hypotension. Troponin 0.11 likely from cardiac strain. EKG showing new right bundle-branch block, and  numerous T-wave changes but no overt sign of ACS - Echo - Plus minus cardiology consult - Cycle troponins - EKG in a.m.  HTN: hypotension from septic shock - hold home dilt and HCTZ and benazepril  DVT: multiple, chronic -  Continue coumadin - coags  Hyponatremia: - IVF  AKI: Creatinine 1.38. Baseline 0.9. Likely from cardiorenal syndrome and dehydration - IVF - BMP in a.m.  Diabetes: Glucose 246 on admission - A1c - SSI  - hold home lantus 36 QHS until condition improves  Hypomag: Pt w/ chronic low Mag - Magnesium level  GERD: - continue PPI  Depression:  - continue cymbalta, remeron   Code Status: FULL DVT Prophylaxis: Coumadin Family Communication: Sister in Social worker, daughter.  Disposition Plan: Pending improvement  Joia Doyle, Elmon Else, MD Family Medicine [facility] [email or web address] Password [email or web address]

## 2014-09-06 ENCOUNTER — Inpatient Hospital Stay (INDEPENDENT_AMBULATORY_CARE_PROVIDER_SITE_OTHER): Payer: Medicare Other

## 2014-09-06 ENCOUNTER — Other Ambulatory Visit (HOSPITAL_COMMUNITY): Payer: Medicare Other

## 2014-09-06 DIAGNOSIS — I509 Heart failure, unspecified: Secondary | ICD-10-CM

## 2014-09-06 LAB — BLOOD GAS, ARTERIAL
ACID-BASE EXCESS: 0.4 mmol/L (ref 0.0–2.0)
Acid-base deficit: 1.2 mmol/L (ref 0.0–2.0)
Bicarbonate: 23.9 mEq/L (ref 20.0–24.0)
Drawn by: 317771
O2 CONTENT: 3 L/min
O2 Saturation: 91.7 %
PCO2 ART: 35 mmHg (ref 35.0–45.0)
TCO2: 18.7 mmol/L (ref 0–100)
pH, Arterial: 7.45 (ref 7.350–7.450)
pO2, Arterial: 64.3 mmHg — ABNORMAL LOW (ref 80.0–100.0)

## 2014-09-06 LAB — TROPONIN I
TROPONIN I: 0.08 ng/mL — AB (ref <0.031)
Troponin I: 0.08 ng/mL — ABNORMAL HIGH (ref <0.031)
Troponin I: 0.08 ng/mL — ABNORMAL HIGH (ref <0.031)

## 2014-09-06 LAB — LACTIC ACID, PLASMA: LACTIC ACID, VENOUS: 1.1 mmol/L (ref 0.5–2.0)

## 2014-09-06 LAB — GLUCOSE, CAPILLARY
GLUCOSE-CAPILLARY: 163 mg/dL — AB (ref 65–99)
GLUCOSE-CAPILLARY: 256 mg/dL — AB (ref 65–99)
Glucose-Capillary: 253 mg/dL — ABNORMAL HIGH (ref 65–99)
Glucose-Capillary: 166 mg/dL — ABNORMAL HIGH (ref 65–99)
Glucose-Capillary: 225 mg/dL — ABNORMAL HIGH (ref 65–99)

## 2014-09-06 LAB — PROTIME-INR
INR: 2.25 — AB (ref 0.00–1.49)
Prothrombin Time: 24.7 seconds — ABNORMAL HIGH (ref 11.6–15.2)

## 2014-09-06 LAB — MRSA PCR SCREENING: MRSA BY PCR: NEGATIVE

## 2014-09-06 MED ORDER — PNEUMOCOCCAL VAC POLYVALENT 25 MCG/0.5ML IJ INJ
0.5000 mL | INJECTION | INTRAMUSCULAR | Status: DC
  Filled 2014-09-06: qty 0.5

## 2014-09-06 MED ORDER — WARFARIN - PHARMACIST DOSING INPATIENT
Status: DC
  Administered 2014-09-07 – 2014-09-08 (×2)

## 2014-09-06 MED ORDER — MAGNESIUM OXIDE 400 (241.3 MG) MG PO TABS
600.0000 mg | ORAL_TABLET | Freq: Every day | ORAL | Status: DC
  Administered 2014-09-06 – 2014-09-09 (×4): 600 mg via ORAL
  Filled 2014-09-06 (×4): qty 2

## 2014-09-06 MED ORDER — WARFARIN SODIUM 2.5 MG PO TABS
2.5000 mg | ORAL_TABLET | Freq: Once | ORAL | Status: AC
Start: 2014-09-06 — End: 2014-09-06
  Administered 2014-09-06: 2.5 mg via ORAL
  Filled 2014-09-06: qty 1

## 2014-09-06 MED ORDER — LEVALBUTEROL HCL 1.25 MG/0.5ML IN NEBU
1.2500 mg | INHALATION_SOLUTION | RESPIRATORY_TRACT | Status: DC
  Administered 2014-09-06: 1.25 mg via RESPIRATORY_TRACT
  Filled 2014-09-06: qty 0.5

## 2014-09-06 MED ORDER — LEVALBUTEROL HCL 1.25 MG/0.5ML IN NEBU
1.2500 mg | INHALATION_SOLUTION | Freq: Three times a day (TID) | RESPIRATORY_TRACT | Status: DC
  Administered 2014-09-06 – 2014-09-09 (×7): 1.25 mg via RESPIRATORY_TRACT
  Filled 2014-09-06 (×8): qty 0.5

## 2014-09-06 MED ORDER — SODIUM CHLORIDE 0.9 % IN NEBU
INHALATION_SOLUTION | RESPIRATORY_TRACT | Status: AC
  Administered 2014-09-06: 3 mL
  Filled 2014-09-06: qty 3

## 2014-09-06 NOTE — Progress Notes (Signed)
Rebecca [name] AVW:[id number] DOB: [birth year] DOA: 09/05/2014 PCP: Isabella Stalling, MD             Physical Exam: Blood pressure 116/48, pulse 85, temperature 100.4 F (38 C), temperature source Oral, resp. rate 26, height  (1.727 m), weight 246 lb 4.1 oz (111.7 kg), SpO2 99 %. no JVD no carotid bruits no thyromegaly lungs diminished breath sounds in the bases prolonged history phase no rales wheeze rhonchi appreciable heart regular rhythm no S3-S4 no heaves rubs extremities no clubbing cyanosis or edema   Investigations:  Recent Results (from the past 240 hour(s))  Urine culture     Status: None (Preliminary result)   Collection Time: 09/05/14  8:15 PM  Result Value Ref Range Status   Specimen Description URINE, CATHETERIZED  Final   Special Requests NONE  Final   Culture PENDING  Incomplete   Report Status PENDING  Incomplete  Culture, blood (x 2)     Status: None (Preliminary result)   Collection Time: 09/05/14 11:05 PM  Result Value Ref Range Status   Specimen Description BLOOD RIGHT HAND  Final   Special Requests BOTTLES DRAWN AEROBIC AND ANAEROBIC 10CC EACH  Final   Culture NO GROWTH < 12 HOURS  Final   Report Status PENDING  Incomplete  MRSA PCR Screening     Status: None   Collection Time: 09/05/14 11:07 PM  Result Value Ref Range Status   MRSA by PCR NEGATIVE NEGATIVE Final    Comment:        The GeneXpert MRSA Assay (FDA approved for NASAL specimens only), is one component of a comprehensive MRSA colonization surveillance program. It is not intended to diagnose MRSA infection nor to guide or monitor treatment for MRSA infections.   Culture, blood (x 2)     Status: None (Preliminary result)   Collection Time: 09/05/14 11:15 PM  Result Value Ref Range Status   Specimen Description BLOOD RIGHT FOREARM  Final   Special Requests BOTTLES DRAWN AEROBIC AND ANAEROBIC 8CC EACH  Final   Culture NO GROWTH < 12 HOURS  Final   Report Status PENDING  Incomplete      Basic Metabolic Panel:  Recent Labs  82/95/62 1913  NA 131*  K 4.6  CL 98*  CO2 24  GLUCOSE 246*  BUN 28*  CREATININE 1.38*  CALCIUM 8.5*  MG 2.3   Liver Function Tests:  Recent Labs  09/05/14 1913  AST 18  ALT 18  ALKPHOS 53  BILITOT 0.8  PROT 6.6  ALBUMIN 3.3*     CBC:  Recent Labs  09/05/14 1913  WBC 19.5*  NEUTROABS 16.1*  HGB 12.1  HCT 36.2  MCV 86.8  PLT 210    Dg Chest Port 1 View  09/05/2014   CLINICAL DATA:  Shortness of breath since yesterday.  EXAM: PORTABLE CHEST - 1 VIEW  COMPARISON:  10/29/2010  FINDINGS: Limited examination due to body habitus and low lung volumes. The heart is enlarged. The mediastinal and hilar contours are prominent. Low lung volumes with vascular crowding and atelectasis. Could not exclude mild pulmonary edema. No large pleural effusions.  IMPRESSION: Limited examination due to body habitus.  Low lung volumes with vascular congestion, vascular crowding and atelectasis. Could not exclude mild edema.   Electronically Signed   By: Rudie Meyer M.D.   On: 09/05/2014 19:09      Medications:   Impression: Principal Problem:   Sepsis Active Problems:   Hypoxia   Elevated  troponin   Acute respiratory failure   Lower leg DVT (deep venous thromboembolism), chronic   Essential hypertension   Depression   AKI (acute kidney injury)   Poorly controlled diabetes mellitus   Hypomagnesemia   Hyponatremia   Pyelonephritis     Plan: Continue Rocephin 1 g IV every 24 hours for UTI incentive spirometry out of bed to chair twice a day monitor glycemic control Xopenex every 4 hours while awake nebulizer monitor electrolytes and magnesium in a.m. 2-D echo today to assess systolic and diastolic function   Consultants:   Procedures   Antibiotics: Rocephin 1 g IV every 24 hours                  Code Status: Full  Family Communication:  Spoke with family at bedside Disposition Plan see plan above  Time spent: 30  minutes   LOS: 1 day   Angella Montas M   09/06/2014, 11:36 AM

## 2014-09-06 NOTE — Progress Notes (Signed)
ANTICOAGULATION CONSULT NOTE - Initial Consult  Pharmacy Consult for Coumadin (chronic Rx PTA) Indication: H/O DVT  Allergies  Allergen Reactions  . Codeine Anxiety  . Compazine Anaphylaxis  . Other Anaphylaxis, Rash and Other (See Comments)    Uncoded Allergy. Allergen: INOVAR Uncoded Allergy. Allergen: ALL ADHESIVES Uncoded Allergy. Allergen: SILK SUTURE Uncoded Allergy. Allergen: merthiolate Thermasol  . Penicillins Shortness Of Breath and Rash  . Morphine And Related Other (See Comments)    Patient states that it makes her lose her state of mind.    Patient Measurements: Height: 5\' 8"  (172.7 cm) Weight: 246 lb 4.1 oz (111.7 kg) IBW/kg (Calculated) : 63.9  Vital Signs: Temp: 100.4 F (38 C) (08/02 0743) Temp Source: Oral (08/02 0743) BP: 116/48 mmHg (08/02 1000) Pulse Rate: 85 (08/02 1000)  Labs:  Recent Labs  09/05/14 1913 09/05/14 2216 09/06/14 0400  HGB 12.1  --   --   HCT 36.2  --   --   PLT 210  --   --   APTT 64*  --   --   LABPROT 23.3*  --  24.7*  INR 2.08*  --  2.25*  CREATININE 1.38*  --   --   TROPONINI 0.11* 0.12* 0.08*   Estimated Creatinine Clearance: 46.9 mL/min (by C-G formula based on Cr of 1.38).  Medical History: Past Medical History  Diagnosis Date  . Diabetes mellitus   . DVT (deep venous thrombosis)     multiple  . Neuropathy   . Narcolepsy   . Cataplexy   . GERD (gastroesophageal reflux disease)   . Hiatal hernia   . Hypertension   . Shortness of breath    Medications:  Prescriptions prior to admission  Medication Sig Dispense Refill Last Dose  . Ascorbic Acid (VITAMIN C WITH ROSE HIPS) 500 MG tablet Take 500 mg by mouth daily.   Past Week at Unknown time  . benazepril-hydrochlorthiazide (LOTENSIN HCT) 20-25 MG per tablet Take 1 tablet by mouth 2 (two) times daily.    09/05/2014 at Unknown time  . Biotin (BIOTIN MAXIMUM STRENGTH) 10 MG TABS Take 1 tablet by mouth daily.   Past Week at Unknown time  . celecoxib (CELEBREX) 200  MG capsule Take 200 mg by mouth daily as needed. For arthritis   09/05/2014 at Unknown time  . cholecalciferol (VITAMIN D) 1000 UNITS tablet Take 1,000 Units by mouth daily.   Past Week at Unknown time  . Cholecalciferol (VITAMIN D3) 3000 UNITS TABS Take 1 capsule by mouth daily.   09/05/2014 at Unknown time  . clomiPRAMINE (ANAFRANIL) 25 MG capsule Take 25 mg by mouth 4 (four) times daily.    09/05/2014 at Unknown time  . clonazePAM (KLONOPIN) 2 MG tablet Take 2 mg by mouth at bedtime.   09/04/2014 at Unknown time  . Coenzyme Q10 (CO Q10) 200 MG CAPS Take 1 capsule by mouth daily.   Past Week at Unknown time  . diltiazem (CARDIZEM CD) 180 MG 24 hr capsule Take 2 capsules (360 mg total) by mouth daily. 30 capsule 3 09/05/2014 at Unknown time  . DULoxetine (CYMBALTA) 60 MG capsule Take 60 mg by mouth daily.     09/05/2014 at Unknown time  . Ferrous Fumarate (FERROCITE) 324 (106 FE) MG TABS Take 1 capsule by mouth 3 (three) times a week.   Past Week at Unknown time  . Hyaluronic Acid-Vitamin C (HYALURONIC ACID PO) Take 100 mg by mouth daily.   Past Week at Unknown time  .  insulin glargine (LANTUS) 100 UNIT/ML injection Inject 36 Units into the skin at bedtime.    09/04/2014 at Unknown time  . Magnesium 300 MG CAPS Take 600 mg by mouth daily.   09/04/2014 at Unknown time  . metFORMIN (GLUCOPHAGE) 500 MG tablet Take 500 mg by mouth 2 (two) times daily with a meal.     09/05/2014 at Unknown time  . mirtazapine (REMERON) 15 MG tablet Take 15 mg by mouth at bedtime.     09/04/2014 at Unknown time  . omeprazole (PRILOSEC) 20 MG capsule TAKE 1 CAPSULE BY MOUTH TWICE DAILY FOR ACID REFLUX 60 capsule 5 09/05/2014 at Unknown time  . oxyCODONE (OXY IR/ROXICODONE) 5 MG immediate release tablet Take 5 mg by mouth 2 (two) times daily as needed. For pain  0 09/05/2014 at Unknown time  . potassium chloride (MICRO-K) 10 MEQ CR capsule Take 10 mEq by mouth 4 (four) times daily.    09/04/2014 at Unknown time  . Probiotic Product (PROBIOTIC  ADVANCED PO) Take 1 capsule by mouth daily.   Past Week at Unknown time  . QUEtiapine (SEROQUEL) 100 MG tablet Take 100 mg by mouth at bedtime.   09/04/2014 at Unknown time  . RA KRILL OIL 500 MG CAPS Take 1 capsule by mouth daily.   Past Week at Unknown time  . saxagliptin HCl (ONGLYZA) 2.5 MG TABS tablet Take 5 mg by mouth daily.   09/05/2014 at Unknown time  . Sennosides 25 MG TABS Take 25 mg by mouth daily.   09/05/2014 at Unknown time  . warfarin (COUMADIN) 5 MG tablet See written post operative Coumadin instructions. (Patient taking differently: Take 2.5-5 mg by mouth every morning. Alternate taking  with 2.5mg  daily) 1 tablet 0 09/05/2014 at 500a   Assessment: 74yo female with h/o DVT.  Pt on Coumadin PTA and INR therapeutic on admission.  Home dose listed above.    Goal of Therapy:  INR 2-3 Monitor platelets by anticoagulation protocol: Yes   Plan:  Coumadin 2.5mg  today x 1 INR daily  Wendell Nicoson A 09/06/2014,10:21 AM

## 2014-09-06 NOTE — Progress Notes (Signed)
Nurse called about putting pt on face tent because pt breathing through her mouth while sleeping placed on 28% (5lpm ) spo2 89% pt breathing 40 plus times per minute.  2149 increased to 35% (8lpm)  To get spo2 91% rr 38/min will continue to monitor through out the night

## 2014-09-06 NOTE — Care Management Note (Signed)
Case Management Note  Patient Details  Name: Rebecca [name] MRN: [id number] Date of Birth: [birth year]  Expected Discharge Date:                  Expected Discharge Plan:  Home w Home Health Services  In-House Referral:  NA  Discharge planning Services  CM Consult  Post Acute Care Choice:  NA Choice offered to:  NA  DME Arranged:    DME Agency:     HH Arranged:    HH Agency:     Status of Service:  In process, will continue to follow  Medicare Important Message Given:    Date Medicare IM Given:    Medicare IM give by:    Date Additional Medicare IM Given:    Additional Medicare Important Message give by:     If discussed at Long Length of Stay Meetings, dates discussed:    Additional Comments: 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 Pt is from home, lives with son who is not home during the day. Pt has no home O2 or HH services prior to admission. Pt plans to return home at DC. Anticipate need for HH at DC. Pt will need PT eval and home O2 assessment prior to DC. Will cont to follow for DC planning.  Malcolm Metro, RN 09/06/2014, 2:29 PM

## 2014-09-06 NOTE — Progress Notes (Signed)
Upon realizing pt had no order for O2, order place to keep spo2 > 92% per respiratory for MD to sign off on  pt sats dropped during the night and had to increase O2 to 35% pt doesn't wear o2 at home.

## 2014-09-06 NOTE — Progress Notes (Signed)
Inpatient Diabetes Program Recommendations  AACE/ADA: New Consensus Statement on Inpatient Glycemic Control (2013)  Target Ranges:  Prepandial:   less than 140 mg/dL      Peak postprandial:   less than 180 mg/dL (1-2 hours)      Critically ill patients:  140 - 180 mg/dL  Results for Rebecca [name], Rebecca [name] (MRN [id number]) as of 09/06/2014 11:15  Ref. Range 09/05/2014 23:11 09/06/2014 07:42  Glucose-Capillary Latest Ref Range: 65-99 mg/dL 614 (H) 431 (H)   Diabetes history: Type 2 diabetes Outpatient Diabetes medications: Lantus 36 units q HS, Metformin 500 mg bid, Onglyza 2.5 mg daily Current orders for Inpatient glycemic control:  Novolog resistant tid with meals and HS  Consider reducing Novolog correction to moderate tid with meals and HS.  Also consider restarting 1/2 of patient's home dose of Lantus to meet basal insulin needs while patient.  May consider restarting Lantus 18 units daily.  Thanks, Beryl Meager, RN, BC-ADM Inpatient Diabetes Coordinator Pager [phone] (8a-5p)

## 2014-09-07 DIAGNOSIS — A4151 Sepsis due to Escherichia coli [E. coli]: Secondary | ICD-10-CM

## 2014-09-07 DIAGNOSIS — N179 Acute kidney failure, unspecified: Secondary | ICD-10-CM

## 2014-09-07 DIAGNOSIS — I050 Rheumatic mitral stenosis: Secondary | ICD-10-CM

## 2014-09-07 DIAGNOSIS — I825Z9 Chronic embolism and thrombosis of unspecified deep veins of unspecified distal lower extremity: Secondary | ICD-10-CM

## 2014-09-07 DIAGNOSIS — I5031 Acute diastolic (congestive) heart failure: Secondary | ICD-10-CM

## 2014-09-07 DIAGNOSIS — I10 Essential (primary) hypertension: Secondary | ICD-10-CM

## 2014-09-07 DIAGNOSIS — R7989 Other specified abnormal findings of blood chemistry: Secondary | ICD-10-CM

## 2014-09-07 DIAGNOSIS — I38 Endocarditis, valve unspecified: Secondary | ICD-10-CM

## 2014-09-07 DIAGNOSIS — J9601 Acute respiratory failure with hypoxia: Secondary | ICD-10-CM

## 2014-09-07 DIAGNOSIS — E1165 Type 2 diabetes mellitus with hyperglycemia: Secondary | ICD-10-CM

## 2014-09-07 DIAGNOSIS — N12 Tubulo-interstitial nephritis, not specified as acute or chronic: Secondary | ICD-10-CM

## 2014-09-07 LAB — BASIC METABOLIC PANEL
ANION GAP: 7 (ref 5–15)
BUN: 25 mg/dL — ABNORMAL HIGH (ref 6–20)
CALCIUM: 7.8 mg/dL — AB (ref 8.9–10.3)
CHLORIDE: 104 mmol/L (ref 101–111)
CO2: 23 mmol/L (ref 22–32)
Creatinine, Ser: 1.31 mg/dL — ABNORMAL HIGH (ref 0.44–1.00)
GFR calc Af Amer: 45 mL/min — ABNORMAL LOW (ref >60)
GFR calc non Af Amer: 39 mL/min — ABNORMAL LOW (ref >60)
Glucose, Bld: 166 mg/dL — ABNORMAL HIGH (ref 65–99)
POTASSIUM: 4.2 mmol/L (ref 3.5–5.1)
SODIUM: 134 mmol/L — AB (ref 135–145)

## 2014-09-07 LAB — PROTIME-INR
INR: 2.04 — ABNORMAL HIGH (ref 0.00–1.49)
Prothrombin Time: 22.9 seconds — ABNORMAL HIGH (ref 11.6–15.2)

## 2014-09-07 LAB — CBC WITH DIFFERENTIAL/PLATELET
BASOS PCT: 0 % (ref 0–1)
Basophils Absolute: 0 10*3/uL (ref 0.0–0.1)
Eosinophils Absolute: 0.4 10*3/uL (ref 0.0–0.7)
Eosinophils Relative: 3 % (ref 0–5)
HEMATOCRIT: 36.7 % (ref 36.0–46.0)
HEMOGLOBIN: 12 g/dL (ref 12.0–15.0)
Lymphocytes Relative: 11 % — ABNORMAL LOW (ref 12–46)
Lymphs Abs: 1.2 10*3/uL (ref 0.7–4.0)
MCH: 29.2 pg (ref 26.0–34.0)
MCHC: 32.7 g/dL (ref 30.0–36.0)
MCV: 89.3 fL (ref 78.0–100.0)
Monocytes Absolute: 1.4 10*3/uL — ABNORMAL HIGH (ref 0.1–1.0)
Monocytes Relative: 13 % — ABNORMAL HIGH (ref 3–12)
NEUTROS ABS: 8.3 10*3/uL — AB (ref 1.7–7.7)
NEUTROS PCT: 74 % (ref 43–77)
Platelets: 182 10*3/uL (ref 150–400)
RBC: 4.11 MIL/uL (ref 3.87–5.11)
RDW: 13.9 % (ref 11.5–15.5)
WBC: 11.3 10*3/uL — AB (ref 4.0–10.5)

## 2014-09-07 LAB — GLUCOSE, CAPILLARY
Glucose-Capillary: 151 mg/dL — ABNORMAL HIGH (ref 65–99)
Glucose-Capillary: 240 mg/dL — ABNORMAL HIGH (ref 65–99)
Glucose-Capillary: 184 mg/dL — ABNORMAL HIGH (ref 65–99)
Glucose-Capillary: 196 mg/dL — ABNORMAL HIGH (ref 65–99)

## 2014-09-07 LAB — MAGNESIUM: MAGNESIUM: 2.4 mg/dL (ref 1.7–2.4)

## 2014-09-07 LAB — HEMOGLOBIN A1C
HEMOGLOBIN A1C: 7.3 % — AB (ref 4.8–5.6)
Mean Plasma Glucose: 163 mg/dL

## 2014-09-07 MED ORDER — FUROSEMIDE 20 MG PO TABS
20.0000 mg | ORAL_TABLET | Freq: Two times a day (BID) | ORAL | Status: DC
  Administered 2014-09-07: 20 mg via ORAL
  Filled 2014-09-07: qty 1

## 2014-09-07 MED ORDER — FUROSEMIDE 40 MG PO TABS
40.0000 mg | ORAL_TABLET | Freq: Two times a day (BID) | ORAL | Status: DC
  Administered 2014-09-07 – 2014-09-09 (×4): 40 mg via ORAL
  Filled 2014-09-07 (×4): qty 1

## 2014-09-07 MED ORDER — WARFARIN SODIUM 5 MG PO TABS
5.0000 mg | ORAL_TABLET | Freq: Once | ORAL | Status: AC
  Administered 2014-09-07: 5 mg via ORAL
  Filled 2014-09-07: qty 1

## 2014-09-07 MED ORDER — HYDROCOD POLST-CPM POLST ER 10-8 MG/5ML PO SUER
5.0000 mL | Freq: Two times a day (BID) | ORAL | Status: DC | PRN
  Administered 2014-09-07 – 2014-09-08 (×2): 5 mL via ORAL
  Filled 2014-09-07 (×2): qty 5

## 2014-09-07 MED ORDER — SODIUM CHLORIDE 0.9 % IN NEBU
INHALATION_SOLUTION | RESPIRATORY_TRACT | Status: AC
  Administered 2014-09-07: 3 mL
  Filled 2014-09-07: qty 3

## 2014-09-07 NOTE — Progress Notes (Signed)
eLink Physician-Brief Progress Note Patient Name: Rebecca [name] DOB: December 16, 1940 MRN: [id number]   Date of Service  09/07/2014  HPI/Events of Note  Wants cough syrup  eICU Interventions  tussionex prn     Intervention Category Minor Interventions: Routine modifications to care plan (e.g. PRN medications for pain, fever)  Carigan Lister 09/07/2014, 8:22 PM

## 2014-09-07 NOTE — Care Management Note (Signed)
Case Management Note  Patient Details  Name: Rebecca [name] MRN: [id number] Date of Birth: September 23, 1940  Expected Discharge Date:                  Expected Discharge Plan:  Home w Home Health Services  In-House Referral:  NA  Discharge planning Services  CM Consult  Post Acute Care Choice:  NA Choice offered to:  NA  DME Arranged:    DME Agency:     HH Arranged:    HH Agency:     Status of Service:  In process, will continue to follow  Medicare Important Message Given:    Date Medicare IM Given:    Medicare IM give by:    Date Additional Medicare IM Given:    Additional Medicare Important Message give by:     If discussed at Long Length of Stay Meetings, dates discussed:    Additional Comments: Visited with patient again today. Pt is able to ambulate at baseline with the use of a walker. Pt falls frequently from weakness and balance issues. Will need PT eval once stable.  Malcolm Metro, RN 09/07/2014, 11:08 AM

## 2014-09-07 NOTE — Consult Note (Signed)
Reason for Consult: CHF Referring Physician: Dr. Cindie Laroche Cardiologist: New Dr. Areatha Keas is an 74 y.o. female.  HPI: This is a 74 year old female with no prior cardiac history. She was brought to the emergency room with acute respiratory failure likely multifactorial including heart failure and pneumonia. 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 Chest x-ray vascular congestion, BNP 631, troponin 0.083 and 0.11, WBC 19.5. 2-D echo normal LV function EF 65-70% with grade 1 diastolic dysfunction, high filling pressures, mild aortic stenosis, mild to moderate mitral valve stenosis and mild MR, left atrium severely dilated, moderate elevated pulmonary pressures.  Patient lives at home with her son. She has about a 2 year history of dyspnea on exertion with little activity. She walks with a walker because of her knees. Over the last 3-4 days her dyspnea worsened to the point or she was panting for breath. She denies any chest pain, palpitations, dizziness or presyncope.   Past Medical History  Diagnosis Date  . Diabetes mellitus   . DVT (deep venous thrombosis)     multiple  . Neuropathy   . Narcolepsy   . Cataplexy   . GERD (gastroesophageal reflux disease)   . Hiatal hernia   . Hypertension   . Shortness of breath     Past Surgical History  Procedure Laterality Date  . Abdominal hysterectomy    . Hemorroidectomy    . Carpal tunnel release    . Cholecystectomy    . Kidney surgery    . Total hip arthroplasty      bilateral  . Nephrostomy    . Carpectomy  12/26/2011    Procedure: CARPECTOMY;  Surgeon: Cammie Sickle., MD;  Location: [facility];  Service: Orthopedics;  Laterality: Right;  PROXIMAL ROW CARPECTOMY RIGHT WRIST and neurectomy    Family History  Problem Relation Age of Onset  . Heart disease    . Diabetes      Social History:  reports  that she quit smoking about 25 years ago. She has never used smokeless tobacco. She reports that she does not drink alcohol or use illicit drugs.  Allergies:  Allergies  Allergen Reactions  . Codeine Anxiety  . Compazine Anaphylaxis  . Other Anaphylaxis, Rash and Other (See Comments)    Uncoded Allergy. Allergen: INOVAR Uncoded Allergy. Allergen: ALL ADHESIVES Uncoded Allergy. Allergen: SILK SUTURE Uncoded Allergy. Allergen: merthiolate Thermasol  . Penicillins Shortness Of Breath and Rash  . Morphine And Related Other (See Comments)    Patient states that it makes her lose her state of mind.     Medications:  Scheduled Meds: . cefTRIAXone (ROCEPHIN)  IV  2 g Intravenous Q24H  . clomiPRAMINE  25 mg Oral QID  . DULoxetine  60 mg Oral Daily  . FLORA-Q   Oral Daily  . furosemide  20 mg Oral BID  . insulin aspart  0-20 Units Subcutaneous TID WC  . insulin aspart  0-5 Units Subcutaneous QHS  . levalbuterol  1.25 mg Nebulization TID  . magnesium oxide  600 mg Oral Daily  . mirtazapine  15 mg Oral QHS  . pantoprazole  40 mg Oral BID AC  . pneumococcal 23 valent vaccine  0.5 mL Intramuscular Tomorrow-1000  . potassium chloride  10 mEq Oral Daily  . QUEtiapine  100 mg Oral QHS  . sodium chloride  3 mL Intravenous Q12H  . warfarin  5  mg Oral Once  . Warfarin - Pharmacist Dosing Inpatient   Does not apply Q24H   Continuous Infusions: . sodium chloride 100 mL/hr at 09/07/14 0701   PRN Meds:.acetaminophen **OR** acetaminophen, clonazePAM, ondansetron **OR** ondansetron (ZOFRAN) IV   Results for orders placed or performed during the hospital encounter of 09/05/14 (from the past 48 hour(s))  Comprehensive metabolic panel     Status: Abnormal   Collection Time: 09/05/14  7:13 PM  Result Value Ref Range   Sodium 131 (L) 135 - 145 mmol/L   Potassium 4.6 3.5 - 5.1 mmol/L   Chloride 98 (L) 101 - 111 mmol/L   CO2 24 22 - 32 mmol/L   Glucose, Bld 246 (H) 65 - 99 mg/dL   BUN 28 (H) 6 -  20 mg/dL   Creatinine, Ser 1.38 (H) 0.44 - 1.00 mg/dL   Calcium 8.5 (L) 8.9 - 10.3 mg/dL   Total Protein 6.6 6.5 - 8.1 g/dL   Albumin 3.3 (L) 3.5 - 5.0 g/dL   AST 18 15 - 41 U/L   ALT 18 14 - 54 U/L   Alkaline Phosphatase 53 38 - 126 U/L   Total Bilirubin 0.8 0.3 - 1.2 mg/dL   GFR calc non Af Amer 37 (L) >60 mL/min   GFR calc Af Amer 42 (L) >60 mL/min    Comment: (NOTE) The eGFR has been calculated using the CKD EPI equation. This calculation has not been validated in all clinical situations. eGFR's persistently <60 mL/min signify possible Chronic Kidney Disease.    Anion gap 9 5 - 15  CBC with Differential     Status: Abnormal   Collection Time: 09/05/14  7:13 PM  Result Value Ref Range   WBC 19.5 (H) 4.0 - 10.5 K/uL   RBC 4.17 3.87 - 5.11 MIL/uL   Hemoglobin 12.1 12.0 - 15.0 g/dL   HCT 36.2 36.0 - 46.0 %   MCV 86.8 78.0 - 100.0 fL   MCH 29.0 26.0 - 34.0 pg   MCHC 33.4 30.0 - 36.0 g/dL   RDW 13.9 11.5 - 15.5 %   Platelets 210 150 - 400 K/uL   Neutrophils Relative % 82 (H) 43 - 77 %   Neutro Abs 16.1 (H) 1.7 - 7.7 K/uL   Lymphocytes Relative 5 (L) 12 - 46 %   Lymphs Abs 0.9 0.7 - 4.0 K/uL   Monocytes Relative 13 (H) 3 - 12 %   Monocytes Absolute 2.5 (H) 0.1 - 1.0 K/uL   Eosinophils Relative 0 0 - 5 %   Eosinophils Absolute 0.0 0.0 - 0.7 K/uL   Basophils Relative 0 0 - 1 %   Basophils Absolute 0.0 0.0 - 0.1 K/uL  D-dimer, quantitative     Status: Abnormal   Collection Time: 09/05/14  7:13 PM  Result Value Ref Range   D-Dimer, Quant 0.54 (H) 0.00 - 0.48 ug/mL-FEU    Comment:        AT THE INHOUSE ESTABLISHED CUTOFF VALUE OF 0.48 ug/mL FEU, THIS ASSAY HAS BEEN DOCUMENTED IN THE LITERATURE TO HAVE A SENSITIVITY AND NEGATIVE PREDICTIVE VALUE OF AT LEAST 98 TO 99%.  THE TEST RESULT SHOULD BE CORRELATED WITH AN ASSESSMENT OF THE CLINICAL PROBABILITY OF DVT / VTE.   Troponin I     Status: Abnormal   Collection Time: 09/05/14  7:13 PM  Result Value Ref Range    Troponin I 0.11 (H) <0.031 ng/mL    Comment:        PERSISTENTLY  INCREASED TROPONIN VALUES IN THE RANGE OF 0.04-0.49 ng/mL CAN BE SEEN IN:       -UNSTABLE ANGINA       -CONGESTIVE HEART FAILURE       -MYOCARDITIS       -CHEST TRAUMA       -ARRYHTHMIAS       -LATE PRESENTING MYOCARDIAL INFARCTION       -COPD   CLINICAL FOLLOW-UP RECOMMENDED.   Brain natriuretic peptide     Status: Abnormal   Collection Time: 09/05/14  7:13 PM  Result Value Ref Range   B Natriuretic Peptide 631.0 (H) 0.0 - 100.0 pg/mL  Lactic acid, plasma     Status: None   Collection Time: 09/05/14  7:13 PM  Result Value Ref Range   Lactic Acid, Venous 1.0 0.5 - 2.0 mmol/L  Procalcitonin     Status: None   Collection Time: 09/05/14  7:13 PM  Result Value Ref Range   Procalcitonin 1.06 ng/mL    Comment:        Interpretation: PCT > 0.5 ng/mL and <= 2 ng/mL: Systemic infection (sepsis) is possible, but other conditions are known to elevate PCT as well. (NOTE)         ICU PCT Algorithm               Non ICU PCT Algorithm    ----------------------------     ------------------------------         PCT < 0.25 ng/mL                 PCT < 0.1 ng/mL     Stopping of antibiotics            Stopping of antibiotics       strongly encouraged.               strongly encouraged.    ----------------------------     ------------------------------       PCT level decrease by               PCT < 0.25 ng/mL       >= 80% from peak PCT       OR PCT 0.25 - 0.5 ng/mL          Stopping of antibiotics                                             encouraged.     Stopping of antibiotics           encouraged.    ----------------------------     ------------------------------       PCT level decrease by              PCT >= 0.25 ng/mL       < 80% from peak PCT        AND PCT >= 0.5 ng/mL             Continuing antibiotics                                              encouraged.       Continuing antibiotics            encouraged.     ----------------------------     ------------------------------  PCT level increase compared          PCT > 0.5 ng/mL         with peak PCT AND          PCT >= 0.5 ng/mL             Escalation of antibiotics                                          strongly encouraged.      Escalation of antibiotics        strongly encouraged.   Protime-INR     Status: Abnormal   Collection Time: 09/05/14  7:13 PM  Result Value Ref Range   Prothrombin Time 23.3 (H) 11.6 - 15.2 seconds   INR 2.08 (H) 0.00 - 1.49  APTT     Status: Abnormal   Collection Time: 09/05/14  7:13 PM  Result Value Ref Range   aPTT 64 (H) 24 - 37 seconds    Comment:        IF BASELINE aPTT IS ELEVATED, SUGGEST PATIENT RISK ASSESSMENT BE USED TO DETERMINE APPROPRIATE ANTICOAGULANT THERAPY.   Hemoglobin A1c     Status: Abnormal   Collection Time: 09/05/14  7:13 PM  Result Value Ref Range   Hgb A1c MFr Bld 7.3 (H) 4.8 - 5.6 %    Comment: (NOTE)         Pre-diabetes: 5.7 - 6.4         Diabetes: >6.4         Glycemic control for adults with diabetes: <7.0    Mean Plasma Glucose 163 mg/dL    Comment: (NOTE) Performed At: [facility] [place], [place] [id number] Lindon Romp MD [id number]   Magnesium     Status: None   Collection Time: 09/05/14  7:13 PM  Result Value Ref Range   Magnesium 2.3 1.7 - 2.4 mg/dL  Blood gas, arterial     Status: Abnormal   Collection Time: 09/05/14  7:16 PM  Result Value Ref Range   O2 Content 3.0 L/min   Delivery systems NASAL CANNULA    pH, Arterial 7.479 (H) 7.350 - 7.450   pCO2 arterial 34.9 (L) 35.0 - 45.0 mmHg   pO2, Arterial 58.4 (L) 80.0 - 100.0 mmHg   Bicarbonate 25.6 (H) 20.0 - 24.0 mEq/L   TCO2 22.6 0 - 100 mmol/L   Acid-Base Excess 2.3 (H) 0.0 - 2.0 mmol/L   O2 Saturation 91.0 %   Patient temperature 37.0    Collection site RIGHT RADIAL    Drawn by [id number]    Sample type ARTERIAL    Allens test (pass/fail) PASS PASS  Urinalysis, Routine w  reflex microscopic (not at [facility])     Status: Abnormal   Collection Time: 09/05/14  8:15 PM  Result Value Ref Range   Color, Urine YELLOW YELLOW   APPearance HAZY (A) CLEAR   Specific Gravity, Urine 1.020 1.005 - 1.030   pH 7.0 5.0 - 8.0   Glucose, UA NEGATIVE NEGATIVE mg/dL   Hgb urine dipstick LARGE (A) NEGATIVE   Bilirubin Urine NEGATIVE NEGATIVE   Ketones, ur TRACE (A) NEGATIVE mg/dL   Protein, ur 100 (A) NEGATIVE mg/dL   Urobilinogen, UA 0.2 0.0 - 1.0 mg/dL   Nitrite NEGATIVE NEGATIVE   Leukocytes, UA MODERATE (A) NEGATIVE  Urine culture  Status: None (Preliminary result)   Collection Time: 09/05/14  8:15 PM  Result Value Ref Range   Specimen Description URINE, CATHETERIZED    Special Requests NONE    Culture PENDING    Report Status PENDING   Urine microscopic-add on     Status: Abnormal   Collection Time: 09/05/14  8:15 PM  Result Value Ref Range   Squamous Epithelial / LPF MANY (A) RARE   WBC, UA 21-50 <3 WBC/hpf   RBC / HPF 7-10 <3 RBC/hpf   Bacteria, UA MANY (A) RARE  Troponin I     Status: Abnormal   Collection Time: 09/05/14 10:16 PM  Result Value Ref Range   Troponin I 0.12 (H) <0.031 ng/mL    Comment:        PERSISTENTLY INCREASED TROPONIN VALUES IN THE RANGE OF 0.04-0.49 ng/mL CAN BE SEEN IN:       -UNSTABLE ANGINA       -CONGESTIVE HEART FAILURE       -MYOCARDITIS       -CHEST TRAUMA       -ARRYHTHMIAS       -LATE PRESENTING MYOCARDIAL INFARCTION       -COPD   CLINICAL FOLLOW-UP RECOMMENDED.   Culture, blood (x 2)     Status: None (Preliminary result)   Collection Time: 09/05/14 11:05 PM  Result Value Ref Range   Specimen Description BLOOD RIGHT HAND    Special Requests BOTTLES DRAWN AEROBIC AND ANAEROBIC 10CC EACH    Culture NO GROWTH 2 DAYS    Report Status PENDING   MRSA PCR Screening     Status: None   Collection Time: 09/05/14 11:07 PM  Result Value Ref Range   MRSA by PCR NEGATIVE NEGATIVE    Comment:        The GeneXpert MRSA Assay  (FDA approved for NASAL specimens only), is one component of a comprehensive MRSA colonization surveillance program. It is not intended to diagnose MRSA infection nor to guide or monitor treatment for MRSA infections.   Glucose, capillary     Status: Abnormal   Collection Time: 09/05/14 11:11 PM  Result Value Ref Range   Glucose-Capillary 253 (H) 65 - 99 mg/dL  Culture, blood (x 2)     Status: None (Preliminary result)   Collection Time: 09/05/14 11:15 PM  Result Value Ref Range   Specimen Description BLOOD RIGHT FOREARM    Special Requests BOTTLES DRAWN AEROBIC AND ANAEROBIC 8CC EACH    Culture NO GROWTH 2 DAYS    Report Status PENDING   Lactic acid, plasma     Status: None   Collection Time: 09/05/14 11:30 PM  Result Value Ref Range   Lactic Acid, Venous 1.1 0.5 - 2.0 mmol/L  Blood gas, arterial     Status: Abnormal   Collection Time: 09/06/14  3:34 AM  Result Value Ref Range   O2 Content 3.0 L/min   Delivery systems NASAL CANNULA    pH, Arterial 7.450 7.350 - 7.450   pCO2 arterial 35.0 35.0 - 45.0 mmHg   pO2, Arterial 64.3 (L) 80.0 - 100.0 mmHg   Bicarbonate 23.9 20.0 - 24.0 mEq/L   TCO2 18.7 0 - 100 mmol/L   Acid-Base Excess 0.4 0.0 - 2.0 mmol/L   Acid-base deficit 1.2 0.0 - 2.0 mmol/L   O2 Saturation 91.7 %   Collection site RIGHT RADIAL    Drawn by [id number]    Sample type ARTERIAL DRAW    Allens test (pass/fail) PASS PASS  Troponin I (q 6hr x 3)     Status: Abnormal   Collection Time: 09/06/14  4:00 AM  Result Value Ref Range   Troponin I 0.08 (H) <0.031 ng/mL    Comment:        PERSISTENTLY INCREASED TROPONIN VALUES IN THE RANGE OF 0.04-0.49 ng/mL CAN BE SEEN IN:       -UNSTABLE ANGINA       -CONGESTIVE HEART FAILURE       -MYOCARDITIS       -CHEST TRAUMA       -ARRYHTHMIAS       -LATE PRESENTING MYOCARDIAL INFARCTION       -COPD   CLINICAL FOLLOW-UP RECOMMENDED.   Protime-INR     Status: Abnormal   Collection Time: 09/06/14  4:00 AM  Result Value Ref  Range   Prothrombin Time 24.7 (H) 11.6 - 15.2 seconds   INR 2.25 (H) 0.00 - 1.49  Glucose, capillary     Status: Abnormal   Collection Time: 09/06/14  7:42 AM  Result Value Ref Range   Glucose-Capillary 166 (H) 65 - 99 mg/dL  Troponin I (q 6hr x 3)     Status: Abnormal   Collection Time: 09/06/14 10:42 AM  Result Value Ref Range   Troponin I 0.08 (H) <0.031 ng/mL    Comment:        PERSISTENTLY INCREASED TROPONIN VALUES IN THE RANGE OF 0.04-0.49 ng/mL CAN BE SEEN IN:       -UNSTABLE ANGINA       -CONGESTIVE HEART FAILURE       -MYOCARDITIS       -CHEST TRAUMA       -ARRYHTHMIAS       -LATE PRESENTING MYOCARDIAL INFARCTION       -COPD   CLINICAL FOLLOW-UP RECOMMENDED.   Glucose, capillary     Status: Abnormal   Collection Time: 09/06/14 11:20 AM  Result Value Ref Range   Glucose-Capillary 163 (H) 65 - 99 mg/dL   Comment 1 Notify RN    Comment 2 Document in Chart   Glucose, capillary     Status: Abnormal   Collection Time: 09/06/14  4:15 PM  Result Value Ref Range   Glucose-Capillary 256 (H) 65 - 99 mg/dL   Comment 1 Notify RN    Comment 2 Document in Chart   Troponin I     Status: Abnormal   Collection Time: 09/06/14  4:30 PM  Result Value Ref Range   Troponin I 0.08 (H) <0.031 ng/mL    Comment:        PERSISTENTLY INCREASED TROPONIN VALUES IN THE RANGE OF 0.04-0.49 ng/mL CAN BE SEEN IN:       -UNSTABLE ANGINA       -CONGESTIVE HEART FAILURE       -MYOCARDITIS       -CHEST TRAUMA       -ARRYHTHMIAS       -LATE PRESENTING MYOCARDIAL INFARCTION       -COPD   CLINICAL FOLLOW-UP RECOMMENDED.   Glucose, capillary     Status: Abnormal   Collection Time: 09/06/14  8:38 PM  Result Value Ref Range   Glucose-Capillary 225 (H) 65 - 99 mg/dL   Comment 1 Notify RN   Protime-INR     Status: Abnormal   Collection Time: 09/07/14  4:38 AM  Result Value Ref Range   Prothrombin Time 22.9 (H) 11.6 - 15.2 seconds   INR 2.04 (H) 0.00 - 8.12  Basic metabolic panel  Status:  Abnormal   Collection Time: 09/07/14  4:38 AM  Result Value Ref Range   Sodium 134 (L) 135 - 145 mmol/L   Potassium 4.2 3.5 - 5.1 mmol/L   Chloride 104 101 - 111 mmol/L   CO2 23 22 - 32 mmol/L   Glucose, Bld 166 (H) 65 - 99 mg/dL   BUN 25 (H) 6 - 20 mg/dL   Creatinine, Ser 1.31 (H) 0.44 - 1.00 mg/dL   Calcium 7.8 (L) 8.9 - 10.3 mg/dL   GFR calc non Af Amer 39 (L) >60 mL/min   GFR calc Af Amer 45 (L) >60 mL/min    Comment: (NOTE) The eGFR has been calculated using the CKD EPI equation. This calculation has not been validated in all clinical situations. eGFR's persistently <60 mL/min signify possible Chronic Kidney Disease.    Anion gap 7 5 - 15  Magnesium     Status: None   Collection Time: 09/07/14  4:38 AM  Result Value Ref Range   Magnesium 2.4 1.7 - 2.4 mg/dL  Glucose, capillary     Status: Abnormal   Collection Time: 09/07/14  7:25 AM  Result Value Ref Range   Glucose-Capillary 151 (H) 65 - 99 mg/dL  Glucose, capillary     Status: Abnormal   Collection Time: 09/07/14 11:49 AM  Result Value Ref Range   Glucose-Capillary 240 (H) 65 - 99 mg/dL    Dg Chest Port 1 View  09/05/2014   CLINICAL DATA:  Shortness of breath since yesterday.  EXAM: PORTABLE CHEST - 1 VIEW  COMPARISON:  10/29/2010  FINDINGS: Limited examination due to body habitus and low lung volumes. The heart is enlarged. The mediastinal and hilar contours are prominent. Low lung volumes with vascular crowding and atelectasis. Could not exclude mild pulmonary edema. No large pleural effusions.  IMPRESSION: Limited examination due to body habitus.  Low lung volumes with vascular congestion, vascular crowding and atelectasis. Could not exclude mild edema.   Electronically Signed   By: Marijo Sanes M.D.   On: 09/05/2014 19:09   2-D echoStudy Conclusions  - Left ventricle: The cavity size was normal. There was mild   concentric hypertrophy. Systolic function was vigorous. The   estimated ejection fraction was in the  range of 65% to 70%. Wall   motion was normal; there were no regional wall motion   abnormalities. Doppler parameters are consistent with abnormal   left ventricular relaxation (grade 1 diastolic dysfunction).   Doppler parameters are consistent with high ventricular filling   pressure. - Ventricular septum: Septal motion showed abnormal function and   dyssynergy. These changes are consistent with intraventricular   conduction delay. - Aortic valve: Moderately calcified annulus. Trileaflet; mildly   thickened, mildly calcified leaflets. There was mild stenosis.   Peak velocity (S): 221 cm/s. Mean gradient (S): 12 mm Hg. Valve   area (VTI): 1.82 cm^2. Valve area (Vmax): 1.66 cm^2. Valve area   (Vmean): 1.61 cm^2. - Mitral valve: Moderately to severely calcified annulus. Mildly   thickened leaflets . The findings are consistent with mild to   moderate stenosis. There was mild regurgitation. Valve area by   pressure half-time: 2.16 cm^2. Valve area by continuity equation   (using LVOT flow): 1.01 cm^2. - Left atrium: The atrium was severely dilated. - Tricuspid valve: There was moderate regurgitation. - Pulmonary arteries: PA peak pressure: 49 mm Hg (S). Moderately   elevated pulmonary pressures.    ROS  See HPI Eyes: Negative Ears:Negative for  hearing loss, tinnitus Cardiovascular: Negative for chest pain, palpitations,irregular heartbeat,  near-syncope, orthopnea, paroxysmal nocturnal dyspnea and syncope,edema, claudication, cyanosis,.  Respiratory:   Positive for cough, shortness of breath, sleep disturbances due to breathing, sputum production.   Endocrine: Negative for cold intolerance and heat intolerance.  Hematologic/Lymphatic: Negative for adenopathy and bleeding problem. Does not bruise/bleed easily.  Musculoskeletal: Walks with a walker because of knee problems.   Gastrointestinal: Negative for nausea, vomiting, reflux, abdominal pain, diarrhea, constipation.    Genitourinary: Negative for bladder incontinence, dysuria, flank pain, frequency, hematuria, hesitancy, nocturia and urgency.  Neurological: Negative.  Allergic/Immunologic: Negative for environmental allergies.  Blood pressure 92/50, pulse 86, temperature 97.7 F (36.5 C), temperature source Oral, resp. rate 16, height '5\' 8"'  (1.727 m), weight 249 lb 12.5 oz (113.3 kg), SpO2 97 %. Physical Exam PHYSICAL EXAM: Well-nournished, in no acute distress. Neck: Slight increase JVD, HJR, no Bruit, or thyroid enlargement Lungs: Decreased breath sounds with crackles at the bases Cardiovascular: RRR, PMI not displaced, heart sounds distant, 1/6 systolic murmur at the left sternal border, no gallops, bruit, thrill, or heave. Abdomen: BS normal. Soft without organomegaly, masses, lesions or tenderness. Extremities: Trace of edema bilateral ankles with brawny changes on the left tibia,. SKin: Warm, no lesions or rashes  Musculoskeletal: No deformities Neuro: no focal signs    Assessment/Plan: Acute respiratory failure multifactorial secondary to CHF and possible pneumonia  CHF with normal systolic function but grade 1 diastolic dysfunction, mild ASa nd mild to moderate MS with mild MR. Severely dilated LA and moderately elevated pulmonary pressures. Troponins flat.I/O's negative 822 on Lasix 20 po BID. Continue diuresis. Will increase lasix to 40 mg po BID and watch renal.  Sepsis on IV antibiotics blood cultures pending  Chronic DVTs on Coumadin  AKI: watch on increased diuretics.  Ermalinda Barrios 09/07/2014, 12:42 PM   The patient was seen and examined, and I agree with the assessment and plan as documented above, with modifications as noted below.  Pt admitted with urosepsis with E. Coli (with altered sensorium) and acute hypoxic respiratory failure with CHF and aforementioned echocardiographic findings (valvular heart disease with mild aortic stenosis and mild to moderate mitral stenosis with  mild mitral regurgitation, moderate tricuspid regurgitation, moderately elevated pulmonary pressures, and grade 1 diastolic dysfunction with high filling pressures).  Overall, she is feeling better but still using oxygen. Desaturated at night and I suspect there is likely a component of sleep apnea as well. Her mentation is clear. BP had been lower on admission, currently 113/90 during my exam. Has not been out of bed yet.  Currently on Lasix 20 mg bid and I agree with temporarily increasing this as she still has rales on exam. Will need to monitor renal function as mentioned above. Troponin elevation not indicative of acute coronary syndrome given overall flat profile. Will repeat CBC with differential to see if leukocytosis is indeed trending downward. If unable to wean off O2, consider chest CT but given elevated creatinine would hold off for now.  I explained monitoring valvular heart disease as outpatient to patient and family members (no indication for invasive therapy at this stage).

## 2014-09-07 NOTE — Plan of Care (Signed)
Problem: Phase I Progression Outcomes Goal: Voiding-avoid urinary catheter unless indicated Outcome: Not Met (add Reason) Foley was placed on admission

## 2014-09-07 NOTE — Progress Notes (Signed)
ANTICOAGULATION CONSULT NOTE - follow up  Pharmacy Consult for Coumadin (chronic Rx PTA) Indication: H/O DVT  Allergies  Allergen Reactions  . Codeine Anxiety  . Compazine Anaphylaxis  . Other Anaphylaxis, Rash and Other (See Comments)    Uncoded Allergy. Allergen: INOVAR Uncoded Allergy. Allergen: ALL ADHESIVES Uncoded Allergy. Allergen: SILK SUTURE Uncoded Allergy. Allergen: merthiolate Thermasol  . Penicillins Shortness Of Breath and Rash  . Morphine And Related Other (See Comments)    Patient states that it makes her lose her state of mind.    Patient Measurements: Height:  (172.7 cm) Weight: 249 lb 12.5 oz (113.3 kg) IBW/kg (Calculated) : 63.9  Vital Signs: Temp: 99.2 F (37.3 C) (08/03 0400) Temp Source: Oral (08/03 0400) BP: 117/53 mmHg (08/03 0700) Pulse Rate: 92 (08/03 0700)  Labs:  Recent Labs  09/05/14 1913  09/06/14 0400 09/06/14 1042 09/06/14 1630 09/07/14 0438  HGB 12.1  --   --   --   --   --   HCT 36.2  --   --   --   --   --   PLT 210  --   --   --   --   --   APTT 64*  --   --   --   --   --   LABPROT 23.3*  --  24.7*  --   --  22.9*  INR 2.08*  --  2.25*  --   --  2.04*  CREATININE 1.38*  --   --   --   --  1.31*  TROPONINI 0.11*  < > 0.08* 0.08* 0.08*  --   < > = values in this interval not displayed. Estimated Creatinine Clearance: 49.8 mL/min (by C-G formula based on Cr of 1.31).  Medical History: Past Medical History  Diagnosis Date  . Diabetes mellitus   . DVT (deep venous thrombosis)     multiple  . Neuropathy   . Narcolepsy   . Cataplexy   . GERD (gastroesophageal reflux disease)   . Hiatal hernia   . Hypertension   . Shortness of breath    Medications:  Prescriptions prior to admission  Medication Sig Dispense Refill Last Dose  . Ascorbic Acid (VITAMIN C WITH ROSE HIPS) 500 MG tablet Take 500 mg by mouth daily.   Past Week at Unknown time  . benazepril-hydrochlorthiazide (LOTENSIN HCT) 20-25 MG per tablet Take 1  tablet by mouth 2 (two) times daily.    09/05/2014 at Unknown time  . Biotin (BIOTIN MAXIMUM STRENGTH) 10 MG TABS Take 1 tablet by mouth daily.   Past Week at Unknown time  . celecoxib (CELEBREX) 200 MG capsule Take 200 mg by mouth daily as needed. For arthritis   09/05/2014 at Unknown time  . cholecalciferol (VITAMIN D) 1000 UNITS tablet Take 1,000 Units by mouth daily.   Past Week at Unknown time  . Cholecalciferol (VITAMIN D3) 3000 UNITS TABS Take 1 capsule by mouth daily.   09/05/2014 at Unknown time  . clomiPRAMINE (ANAFRANIL) 25 MG capsule Take 25 mg by mouth 4 (four) times daily.    09/05/2014 at Unknown time  . clonazePAM (KLONOPIN) 2 MG tablet Take 2 mg by mouth at bedtime.   09/04/2014 at Unknown time  . Coenzyme Q10 (CO Q10) 200 MG CAPS Take 1 capsule by mouth daily.   Past Week at Unknown time  . diltiazem (CARDIZEM CD) 180 MG 24 hr capsule Take 2 capsules (360 mg total) by mouth daily.  30 capsule 3 09/05/2014 at Unknown time  . DULoxetine (CYMBALTA) 60 MG capsule Take 60 mg by mouth daily.     09/05/2014 at Unknown time  . Ferrous Fumarate (FERROCITE) 324 (106 FE) MG TABS Take 1 capsule by mouth 3 (three) times a week.   Past Week at Unknown time  . Hyaluronic Acid-Vitamin C (HYALURONIC ACID PO) Take 100 mg by mouth daily.   Past Week at Unknown time  . insulin glargine (LANTUS) 100 UNIT/ML injection Inject 36 Units into the skin at bedtime.    09/04/2014 at Unknown time  . Magnesium 300 MG CAPS Take 600 mg by mouth daily.   09/04/2014 at Unknown time  . metFORMIN (GLUCOPHAGE) 500 MG tablet Take 500 mg by mouth 2 (two) times daily with a meal.     09/05/2014 at Unknown time  . mirtazapine (REMERON) 15 MG tablet Take 15 mg by mouth at bedtime.     09/04/2014 at Unknown time  . omeprazole (PRILOSEC) 20 MG capsule TAKE 1 CAPSULE BY MOUTH TWICE DAILY FOR ACID REFLUX 60 capsule 5 09/05/2014 at Unknown time  . oxyCODONE (OXY IR/ROXICODONE) 5 MG immediate release tablet Take 5 mg by mouth 2 (two) times daily as  needed. For pain  0 09/05/2014 at Unknown time  . potassium chloride (MICRO-K) 10 MEQ CR capsule Take 10 mEq by mouth 4 (four) times daily.    09/04/2014 at Unknown time  . Probiotic Product (PROBIOTIC ADVANCED PO) Take 1 capsule by mouth daily.   Past Week at Unknown time  . QUEtiapine (SEROQUEL) 100 MG tablet Take 100 mg by mouth at bedtime.   09/04/2014 at Unknown time  . RA KRILL OIL 500 MG CAPS Take 1 capsule by mouth daily.   Past Week at Unknown time  . saxagliptin HCl (ONGLYZA) 2.5 MG TABS tablet Take 5 mg by mouth daily.   09/05/2014 at Unknown time  . Sennosides 25 MG TABS Take 25 mg by mouth daily.   09/05/2014 at Unknown time  . warfarin (COUMADIN) 5 MG tablet See written post operative Coumadin instructions. (Patient taking differently: Take 2.5-5 mg by mouth every morning. Alternate taking 5mg  with 2.5mg  daily) 1 tablet 0 09/05/2014 at 500a   Assessment: 74yo female with h/o DVT.  Pt on Coumadin PTA and INR therapeutic on admission.  Home dose listed above.    Goal of Therapy:  INR 2-3 Monitor platelets by anticoagulation protocol: Yes   Plan:  Coumadin 5mg  today x 1 INR daily  [name] A 09/07/2014,8:09 AM

## 2014-09-07 NOTE — Progress Notes (Signed)
Patient O2 saturation at 95% on 1 L nasal O2. 2-D echo reveals LVEF at 6570% grade 1 diastolic dysfunction trace to mild aortic stenosis with concomitant mild to moderate mitral stenosis with concomitant regurgitation significantly enlarged left atrium will add Lasix 20 mg by mouth twice a day take it. Decrease intravascular volume. Cardiology will be consult today to consult on management. Rebecca [name] ZOX:[id number] DOB: 09-10-1940 DOA: 09/05/2014 PCP: Isabella Stalling, MD             Physical Exam: Blood pressure 111/50, pulse 97, temperature 99.2 F (37.3 C), temperature source Oral, resp. rate 32, height  (1.727 [name]), weight 249 lb 12.5 oz (113.3 kg), SpO2 89 %. neck no JVD no carotid bruits no thyromegaly lungs diminished breath sounds in the bases no rales wheeze rhonchi appreciable heart regular rhythm no S3 or S4 no heaves thrills rubs abdomen obese soft nontender organomegaly   Investigations:  Recent Results (from the past 240 hour(s))  Urine culture     Status: None (Preliminary result)   Collection Time: 09/05/14  8:15 PM  Result Value Ref Range Status   Specimen Description URINE, CATHETERIZED  Final   Special Requests NONE  Final   Culture PENDING  Incomplete   Report Status PENDING  Incomplete  Culture, blood (x 2)     Status: None (Preliminary result)   Collection Time: 09/05/14 11:05 PM  Result Value Ref Range Status   Specimen Description BLOOD RIGHT HAND  Final   Special Requests BOTTLES DRAWN AEROBIC AND ANAEROBIC 10CC EACH  Final   Culture NO GROWTH < 12 HOURS  Final   Report Status PENDING  Incomplete  MRSA PCR Screening     Status: None   Collection Time: 09/05/14 11:07 PM  Result Value Ref Range Status   MRSA by PCR NEGATIVE NEGATIVE Final    Comment:        The GeneXpert MRSA Assay (FDA approved for NASAL specimens only), is one component of a comprehensive MRSA colonization surveillance program. It is not intended to diagnose MRSA infection  nor to guide or monitor treatment for MRSA infections.   Culture, blood (x 2)     Status: None (Preliminary result)   Collection Time: 09/05/14 11:15 PM  Result Value Ref Range Status   Specimen Description BLOOD RIGHT FOREARM  Final   Special Requests BOTTLES DRAWN AEROBIC AND ANAEROBIC 8CC EACH  Final   Culture NO GROWTH < 12 HOURS  Final   Report Status PENDING  Incomplete     Basic Metabolic Panel:  Recent Labs  81/19/14 1913 09/07/14 0438  NA 131* 134*  K 4.6 4.2  CL 98* 104  CO2 24 23  GLUCOSE 246* 166*  BUN 28* 25*  CREATININE 1.38* 1.31*  CALCIUM 8.5* 7.8*  MG 2.3 2.4   Liver Function Tests:  Recent Labs  09/05/14 1913  AST 18  ALT 18  ALKPHOS 53  BILITOT 0.8  PROT 6.6  ALBUMIN 3.3*     CBC:  Recent Labs  09/05/14 1913  WBC 19.5*  NEUTROABS 16.1*  HGB 12.1  HCT 36.2  MCV 86.8  PLT 210    Dg Chest Port 1 View  09/05/2014   CLINICAL DATA:  Shortness of breath since yesterday.  EXAM: PORTABLE CHEST - 1 VIEW  COMPARISON:  10/29/2010  FINDINGS: Limited examination due to body habitus and low lung volumes. The heart is enlarged. The mediastinal and hilar contours are prominent. Low lung volumes with vascular crowding  and atelectasis. Could not exclude mild pulmonary edema. No large pleural effusions.  IMPRESSION: Limited examination due to body habitus.  Low lung volumes with vascular congestion, vascular crowding and atelectasis. Could not exclude mild edema.   Electronically Signed   By: Rudie Meyer [name].D.   On: 09/05/2014 19:09      Medications:   Impression: Mild to moderate mitral stenosis with concomitant regurgitation  Principal Problem:   Sepsis Active Problems:   Hypoxia   Elevated troponin   Acute respiratory failure   Lower leg DVT (deep venous thromboembolism), chronic   Essential hypertension   Depression   AKI (acute kidney injury)   Poorly controlled diabetes mellitus   Hypomagnesemia   Hyponatremia    Pyelonephritis     Plan: Add Lasix 20 mg by mouth twice a day monitor electrolytes daily. Cardiology consultation requested today.  Consultants: Cardiology   Procedures   Antibiotics: Rocephin 1 g IV every 24 hours for UTI                  Code Status: Full  Family Communication:  Spoke with patient at length this morning  Disposition Plan see plan above  Time spent: 30 minutes   LOS: 2 days   Rebecca [name]   09/07/2014, 6:58 AM

## 2014-09-08 DIAGNOSIS — I050 Rheumatic mitral stenosis: Secondary | ICD-10-CM | POA: Diagnosis present

## 2014-09-08 DIAGNOSIS — G47419 Narcolepsy without cataplexy: Secondary | ICD-10-CM

## 2014-09-08 DIAGNOSIS — J9621 Acute and chronic respiratory failure with hypoxia: Secondary | ICD-10-CM

## 2014-09-08 DIAGNOSIS — I350 Nonrheumatic aortic (valve) stenosis: Secondary | ICD-10-CM

## 2014-09-08 DIAGNOSIS — E669 Obesity, unspecified: Secondary | ICD-10-CM

## 2014-09-08 DIAGNOSIS — Z7901 Long term (current) use of anticoagulants: Secondary | ICD-10-CM

## 2014-09-08 LAB — GLUCOSE, CAPILLARY
Glucose-Capillary: 150 mg/dL — ABNORMAL HIGH (ref 65–99)
Glucose-Capillary: 183 mg/dL — ABNORMAL HIGH (ref 65–99)
Glucose-Capillary: 167 mg/dL — ABNORMAL HIGH (ref 65–99)
Glucose-Capillary: 165 mg/dL — ABNORMAL HIGH (ref 65–99)

## 2014-09-08 LAB — BASIC METABOLIC PANEL
ANION GAP: 9 (ref 5–15)
BUN: 22 mg/dL — ABNORMAL HIGH (ref 6–20)
CALCIUM: 8 mg/dL — AB (ref 8.9–10.3)
CO2: 24 mmol/L (ref 22–32)
CREATININE: 1.17 mg/dL — AB (ref 0.44–1.00)
Chloride: 104 mmol/L (ref 101–111)
GFR calc non Af Amer: 45 mL/min — ABNORMAL LOW (ref >60)
GFR, EST AFRICAN AMERICAN: 52 mL/min — AB (ref >60)
Glucose, Bld: 175 mg/dL — ABNORMAL HIGH (ref 65–99)
Potassium: 4.3 mmol/L (ref 3.5–5.1)
Sodium: 137 mmol/L (ref 135–145)

## 2014-09-08 LAB — URINE CULTURE: Culture: >=100000

## 2014-09-08 LAB — PROTIME-INR
INR: 2.24 — ABNORMAL HIGH (ref 0.00–1.49)
Prothrombin Time: 24.5 seconds — ABNORMAL HIGH (ref 11.6–15.2)

## 2014-09-08 MED ORDER — WARFARIN SODIUM 5 MG PO TABS
2.5000 mg | ORAL_TABLET | Freq: Once | ORAL | Status: AC
  Administered 2014-09-08: 2.5 mg via ORAL
  Filled 2014-09-08: qty 1

## 2014-09-08 MED ORDER — OXYCODONE HCL 5 MG PO TABS
5.0000 mg | ORAL_TABLET | Freq: Three times a day (TID) | ORAL | Status: DC | PRN
  Administered 2014-09-08 – 2014-09-09 (×2): 5 mg via ORAL
  Filled 2014-09-08 (×2): qty 1

## 2014-09-08 MED ORDER — DILTIAZEM HCL ER COATED BEADS 180 MG PO CP24: 360.0000 mg | ORAL_CAPSULE | Freq: Every day | ORAL | Status: DC

## 2014-09-08 MED ORDER — DILTIAZEM HCL ER COATED BEADS 180 MG PO CP24
180.0000 mg | ORAL_CAPSULE | Freq: Every day | ORAL | Status: DC
  Administered 2014-09-08 – 2014-09-09 (×2): 180 mg via ORAL
  Filled 2014-09-08 (×2): qty 1

## 2014-09-08 NOTE — Progress Notes (Signed)
ANTICOAGULATION CONSULT NOTE - follow up  Pharmacy Consult for Coumadin (chronic Rx PTA) Indication: H/O DVT  Allergies  Allergen Reactions  . Codeine Anxiety  . Compazine Anaphylaxis  . Other Anaphylaxis, Rash and Other (See Comments)    Uncoded Allergy. Allergen: INOVAR Uncoded Allergy. Allergen: ALL ADHESIVES Uncoded Allergy. Allergen: SILK SUTURE Uncoded Allergy. Allergen: merthiolate Thermasol  . Penicillins Shortness Of Breath and Rash  . Morphine And Related Other (See Comments)    Patient states that it makes her lose her state of mind.    Patient Measurements: Height: 5\' 8"  (172.7 cm) Weight: 249 lb 9 oz (113.2 kg) IBW/kg (Calculated) : 63.9  Vital Signs: Temp: 98.3 F (36.8 C) (08/04 1108) Temp Source: Oral (08/04 1108) BP: 121/48 mmHg (08/04 1100) Pulse Rate: 80 (08/04 1100)  Labs:  Recent Labs  09/05/14 1913  09/06/14 0400 09/06/14 1042 09/06/14 1630 09/07/14 0438 09/07/14 1539 09/08/14 0435  HGB 12.1  --   --   --   --   --  12.0  --   HCT 36.2  --   --   --   --   --  36.7  --   PLT 210  --   --   --   --   --  182  --   APTT 64*  --   --   --   --   --   --   --   LABPROT 23.3*  --  24.7*  --   --  22.9*  --  24.5*  INR 2.08*  --  2.25*  --   --  2.04*  --  2.24*  CREATININE 1.38*  --   --   --   --  1.31*  --  1.17*  TROPONINI 0.11*  < > 0.08* 0.08* 0.08*  --   --   --   < > = values in this interval not displayed. Estimated Creatinine Clearance: 55.7 mL/min (by C-G formula based on Cr of 1.17).  Medical History: Past Medical History  Diagnosis Date  . Diabetes mellitus   . DVT (deep venous thrombosis)     multiple  . Neuropathy   . Narcolepsy   . Cataplexy   . GERD (gastroesophageal reflux disease)   . Hiatal hernia   . Hypertension   . Shortness of breath    Medications:  Prescriptions prior to admission  Medication Sig Dispense Refill Last Dose  . Ascorbic Acid (VITAMIN C WITH ROSE HIPS) 500 MG tablet Take 500 mg by mouth daily.    Past Week at Unknown time  . benazepril-hydrochlorthiazide (LOTENSIN HCT) 20-25 MG per tablet Take 1 tablet by mouth 2 (two) times daily.    09/05/2014 at Unknown time  . Biotin (BIOTIN MAXIMUM STRENGTH) 10 MG TABS Take 1 tablet by mouth daily.   Past Week at Unknown time  . celecoxib (CELEBREX) 200 MG capsule Take 200 mg by mouth daily as needed. For arthritis   09/05/2014 at Unknown time  . cholecalciferol (VITAMIN D) 1000 UNITS tablet Take 1,000 Units by mouth daily.   Past Week at Unknown time  . Cholecalciferol (VITAMIN D3) 3000 UNITS TABS Take 1 capsule by mouth daily.   09/05/2014 at Unknown time  . clomiPRAMINE (ANAFRANIL) 25 MG capsule Take 25 mg by mouth 4 (four) times daily.    09/05/2014 at Unknown time  . clonazePAM (KLONOPIN) 2 MG tablet Take 2 mg by mouth at bedtime.   09/04/2014 at Unknown time  .  Coenzyme Q10 (CO Q10) 200 MG CAPS Take 1 capsule by mouth daily.   Past Week at Unknown time  . diltiazem (CARDIZEM CD) 180 MG 24 hr capsule Take 2 capsules (360 mg total) by mouth daily. 30 capsule 3 09/05/2014 at Unknown time  . DULoxetine (CYMBALTA) 60 MG capsule Take 60 mg by mouth daily.     09/05/2014 at Unknown time  . Ferrous Fumarate (FERROCITE) 324 (106 FE) MG TABS Take 1 capsule by mouth 3 (three) times a week.   Past Week at Unknown time  . Hyaluronic Acid-Vitamin C (HYALURONIC ACID PO) Take 100 mg by mouth daily.   Past Week at Unknown time  . insulin glargine (LANTUS) 100 UNIT/ML injection Inject 36 Units into the skin at bedtime.    09/04/2014 at Unknown time  . Magnesium 300 MG CAPS Take 600 mg by mouth daily.   09/04/2014 at Unknown time  . metFORMIN (GLUCOPHAGE) 500 MG tablet Take 500 mg by mouth 2 (two) times daily with a meal.     09/05/2014 at Unknown time  . mirtazapine (REMERON) 15 MG tablet Take 15 mg by mouth at bedtime.     09/04/2014 at Unknown time  . omeprazole (PRILOSEC) 20 MG capsule TAKE 1 CAPSULE BY MOUTH TWICE DAILY FOR ACID REFLUX 60 capsule 5 09/05/2014 at Unknown time  .  oxyCODONE (OXY IR/ROXICODONE) 5 MG immediate release tablet Take 5 mg by mouth 2 (two) times daily as needed. For pain  0 09/05/2014 at Unknown time  . potassium chloride (MICRO-K) 10 MEQ CR capsule Take 10 mEq by mouth 4 (four) times daily.    09/04/2014 at Unknown time  . Probiotic Product (PROBIOTIC ADVANCED PO) Take 1 capsule by mouth daily.   Past Week at Unknown time  . QUEtiapine (SEROQUEL) 100 MG tablet Take 100 mg by mouth at bedtime.   09/04/2014 at Unknown time  . RA KRILL OIL 500 MG CAPS Take 1 capsule by mouth daily.   Past Week at Unknown time  . saxagliptin HCl (ONGLYZA) 2.5 MG TABS tablet Take 5 mg by mouth daily.   09/05/2014 at Unknown time  . Sennosides 25 MG TABS Take 25 mg by mouth daily.   09/05/2014 at Unknown time  . warfarin (COUMADIN) 5 MG tablet See written post operative Coumadin instructions. (Patient taking differently: Take 2.5-5 mg by mouth every morning. Alternate taking  with 2.5mg  daily) 1 tablet 0 09/05/2014 at 500a   Assessment: 73yo female with h/o DVT.  Pt on Coumadin PTA and INR therapeutic on admission.  Home dose listed above.    Goal of Therapy:  INR 2-3 Monitor platelets by anticoagulation protocol: Yes   Plan:  Coumadin 2.5mg  today x 1 (per home regimen) INR daily  [name] A 09/08/2014,12:37 PM

## 2014-09-08 NOTE — Progress Notes (Signed)
Currently on Lasix 40 by mouth twice a day we'll resume diltiazem 180 by mouth daily today no significant orthopnea or dyspnea at rest will get PT consult for strengthening and ambulation electrolytes and renal function within normal parameters Coumadin therapeutic Rebecca [name] MRN:[id number] DOB: 06/04/1940 DOA: 09/05/2014 PCP: , M, MD             Physical Exam: Blood pressure 121/48, pulse 80, temperature 98.3 F (36.8 C), temperature source Oral, resp. rate 18, height 5' 8" (1.727 [name]), weight 249 lb 9 oz (113.2 kg), SpO2 97 %. lungs diminished breath sounds in bases no rales wheeze rhonchi appreciable neck no JVD no carotid bruits no thyromegaly heart regular rhythm no S3 or S4 no heaves thrills rubs   Investigations:  Recent Results (from the past 240 hour(s))  Urine culture     Status: None   Collection Time: 09/05/14  8:15 PM  Result Value Ref Range Status   Specimen Description URINE, CATHETERIZED  Final   Special Requests NONE  Final   Culture   Final    >=100,000 COLONIES/mL ESCHERICHIA COLI Performed at [facility]    Report Status 09/08/2014 FINAL  Final   Organism ID, Bacteria ESCHERICHIA COLI  Final      Susceptibility   Escherichia coli - MIC*    AMPICILLIN >=32 RESISTANT Resistant     CEFAZOLIN <=4 SENSITIVE Sensitive     CEFTRIAXONE <=1 SENSITIVE Sensitive     CIPROFLOXACIN 1 SENSITIVE Sensitive     GENTAMICIN <=1 SENSITIVE Sensitive     IMIPENEM <=0.25 SENSITIVE Sensitive     NITROFURANTOIN <=16 SENSITIVE Sensitive     TRIMETH/SULFA >=320 RESISTANT Resistant     AMPICILLIN/SULBACTAM 16 INTERMEDIATE Intermediate     PIP/TAZO <=4 SENSITIVE Sensitive     * >=100,000 COLONIES/mL ESCHERICHIA COLI  Culture, blood (x 2)     Status: None (Preliminary result)   Collection Time: 09/05/14 11:05 PM  Result Value Ref Range Status   Specimen Description BLOOD RIGHT HAND  Final   Special Requests BOTTLES DRAWN AEROBIC AND ANAEROBIC 10CC EACH   Final   Culture NO GROWTH 3 DAYS  Final   Report Status PENDING  Incomplete  MRSA PCR Screening     Status: None   Collection Time: 09/05/14 11:07 PM  Result Value Ref Range Status   MRSA by PCR NEGATIVE NEGATIVE Final    Comment:        The GeneXpert MRSA Assay (FDA approved for NASAL specimens only), is one component of a comprehensive MRSA colonization surveillance program. It is not intended to diagnose MRSA infection nor to guide or monitor treatment for MRSA infections.   Culture, blood (x 2)     Status: None (Preliminary result)   Collection Time: 09/05/14 11:15 PM  Result Value Ref Range Status   Specimen Description BLOOD RIGHT FOREARM  Final   Special Requests BOTTLES DRAWN AEROBIC AND ANAEROBIC 8CC EACH  Final   Culture NO GROWTH 3 DAYS  Final   Report Status PENDING  Incomplete     Basic Metabolic Panel:  Recent Labs  09/05/14 1913 09/07/14 0438 09/08/14 0435  NA 131* 134* 137  K 4.6 4.2 4.3  CL 98* 104 104  CO2 24 23 24  GLUCOSE 246* 166* 175*  BUN 28* 25* 22*  CREATININE 1.38* 1.31* 1.17*  CALCIUM 8.5* 7.8* 8.0*  MG 2.3 2.4  --    Liver Function Tests:  Recent Labs  09/05/14 1913  AST 18    ALT 18  ALKPHOS 53  BILITOT 0.8  PROT 6.6  ALBUMIN 3.3*     CBC:  Recent Labs  09/05/14 1913 09/07/14 1539  WBC 19.5* 11.3*  NEUTROABS 16.1* 8.3*  HGB 12.1 12.0  HCT 36.2 36.7  MCV 86.8 89.3  PLT 210 182    No results found.    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 and electrolytes were physical therapy for strengthening and ambulation continue Coumadin   Consultants: Cardiology   Procedures   Antibiotics: Rocephin 1 g                   Code Status:   Family Communication:    Disposition Plan see plan above  Time spent: 30 minutes   LOS: 3 days   Rebecca [name]   09/08/2014, 12:11 PM

## 2014-09-08 NOTE — Care Management Important Message (Signed)
Important Message  Patient Details  Name: Rebecca [name] MRN: [id number] Date of Birth: Oct 01, 1940   Medicare Important Message Given:  Yes-second notification given    Malcolm Metro, RN 09/08/2014, 9:40 AM

## 2014-09-08 NOTE — Progress Notes (Signed)
Subjective:  Breathing better, still on O2  Objective:  Vital Signs in the last 24 hours: Temp:  [97.7 F (36.5 C)-100.5 F (38.1 C)] 98.2 F (36.8 C) (08/04 0745) Pulse Rate:  [86-104] 93 (08/04 0800) Resp:  [15-40] 21 (08/04 0800) BP: (69-172)/(38-148) 172/148 mmHg (08/04 0800) SpO2:  [81 %-100 %] 97 % (08/04 0940) FiO2 (%):  [32 %-35 %] 35 % (08/03 2306) Weight:  [249 lb 9 oz (113.2 kg)] 249 lb 9 oz (113.2 kg) (08/04 0500)  Intake/Output from previous day:  Intake/Output Summary (Last 24 hours) at 09/08/14 1027 Last data filed at 09/08/14 0942  Gross per 24 hour  Intake   3358 ml  Output   3950 ml  Net   -592 ml    Physical Exam: General appearance: alert, cooperative, no distress and morbidly obese Neck: no carotid bruit and no JVD Lungs: decreased at bases Heart: regular rate and rhythm and 2/6 AS murmur Extremities: extremities normal, atraumatic, no cyanosis or edema   Rate: 80  Rhythm: normal sinus rhythm and premature atrial contractions (PAC)  Lab Results:  Recent Labs  09/05/14 1913 09/07/14 1539  WBC 19.5* 11.3*  HGB 12.1 12.0  PLT 210 182    Recent Labs  09/07/14 0438 09/08/14 0435  NA 134* 137  K 4.2 4.3  CL 104 104  CO2 23 24  GLUCOSE 166* 175*  BUN 25* 22*  CREATININE 1.31* 1.17*    Recent Labs  09/06/14 1042 09/06/14 1630  TROPONINI 0.08* 0.08*    Recent Labs  09/08/14 0435  INR 2.24*    Scheduled Meds: . cefTRIAXone (ROCEPHIN)  IV  2 g Intravenous Q24H  . clomiPRAMINE  25 mg Oral QID  . DULoxetine  60 mg Oral Daily  . FLORA-Q   Oral Daily  . furosemide  40 mg Oral BID  . insulin aspart  0-20 Units Subcutaneous TID WC  . insulin aspart  0-5 Units Subcutaneous QHS  . levalbuterol  1.25 mg Nebulization TID  . magnesium oxide  600 mg Oral Daily  . mirtazapine  15 mg Oral QHS  . pantoprazole  40 mg Oral BID AC  . pneumococcal 23 valent vaccine  0.5 mL Intramuscular Tomorrow-1000  . potassium chloride  10 mEq Oral  Daily  . QUEtiapine  100 mg Oral QHS  . sodium chloride  3 mL Intravenous Q12H  . Warfarin - Pharmacist Dosing Inpatient   Does not apply Q24H   Continuous Infusions: . sodium chloride 100 mL/hr at 09/08/14 0600   PRN Meds:.acetaminophen **OR** acetaminophen, chlorpheniramine-HYDROcodone, clonazePAM, ondansetron **OR** ondansetron (ZOFRAN) IV   Imaging: Imaging results have been reviewed  Cardiac Studies: Echo 09/06/14 Study Conclusions  - Left ventricle: The cavity size was normal. There was mild concentric hypertrophy. Systolic function was vigorous. The estimated ejection fraction was in the range of 65% to 70%. Wall motion was normal; there were no regional wall motion abnormalities. Doppler parameters are consistent with abnormal left ventricular relaxation (grade 1 diastolic dysfunction). Doppler parameters are consistent with high ventricular filling pressure. - Ventricular septum: Septal motion showed abnormal function and dyssynergy. These changes are consistent with intraventricular conduction delay. - Aortic valve: Moderately calcified annulus. Trileaflet; mildly thickened, mildly calcified leaflets. There was mild stenosis. Peak velocity (S): 221 cm/s. Mean gradient (S): 12 mm Hg. Valve area (VTI): 1.82 cm^2. Valve area (Vmax): 1.66 cm^2. Valve area (Vmean): 1.61 cm^2. - Mitral valve: Moderately to severely calcified annulus. Mildly thickened leaflets . The findings  are consistent with mild to moderate stenosis. There was mild regurgitation. Valve area by pressure half-time: 2.16 cm^2. Valve area by continuity equation (using LVOT flow): 1.01 cm^2. - Left atrium: The atrium was severely dilated. - Tricuspid valve: There was moderate regurgitation. - Pulmonary arteries: PA peak pressure: 49 mm Hg (S). Moderately elevated pulmonary pressures.  Assessment/Plan:  74 year old obese female with no prior cardiac history, brought to  the emergency room 09/05/14 with acute respiratory failure likely multifactorial including diastolic heart failure and pneumonia. Despite positive recorded I/O and increased wgt she has improved sym,ptomatically on BID Lasix.   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 Continue current Rx.  B/P is elevated - resume home dose Diltiazem 180 mg daily, (Benazepril/HCTZ still on hold).  MD to see.  Corine Shelter PA-C 09/08/2014, 10:27 AM [phone]  The patient was seen and examined, and I agree with the assessment and plan as documented above, with modifications as noted below.  Says he is "feeling much better". BP previously elevated but now back to 120/60 before being given diltiazem (I will hold for one more day as BP's have been labile).  Admitted with urosepsis with E. Coli (with altered sensorium) and acute hypoxic respiratory failure with CHF and valvular heart disease with mild aortic stenosis and mild to moderate mitral stenosis with mild mitral regurgitation, moderate tricuspid regurgitation, moderately elevated pulmonary pressures, and grade 1 diastolic dysfunction with high filling pressures.   Desaturated at night in past and I suspect there is likely a component of sleep apnea as well. Her mentation is clear.   Currently on Lasix 40 mg bid. Troponin elevation not indicative of acute coronary syndrome given overall flat profile. Repeat CBC on 8/3 showed leukocytosis is trending downward. If  unable to wean off O2, consider chest CT but not needed at this time.  I explained monitoring valvular heart disease as outpatient to patient and family members yesterday (no indication for invasive therapy at this stage).

## 2014-09-08 NOTE — Progress Notes (Signed)
ANTIBIOTIC CONSULT NOTE- follow up  Pharmacy Consult for Rocephin Indication: rule out pneumonia  Allergies  Allergen Reactions  . Codeine Anxiety  . Compazine Anaphylaxis  . Other Anaphylaxis, Rash and Other (See Comments)    Uncoded Allergy. Allergen: INOVAR Uncoded Allergy. Allergen: ALL ADHESIVES Uncoded Allergy. Allergen: SILK SUTURE Uncoded Allergy. Allergen: merthiolate Thermasol  . Penicillins Shortness Of Breath and Rash  . Morphine And Related Other (See Comments)    Patient states that it makes her lose her state of mind.    Patient Measurements: Height:  (172.7 cm) Weight: 249 lb 9 oz (113.2 kg) IBW/kg (Calculated) : 63.9  Vital Signs: Temp: 98.3 F (36.8 C) (08/04 1108) Temp Source: Oral (08/04 1108) BP: 121/48 mmHg (08/04 1100) Pulse Rate: 80 (08/04 1100)  Labs:  Recent Labs  09/05/14 1913 09/07/14 0438 09/07/14 1539 09/08/14 0435  WBC 19.5*  --  11.3*  --   HGB 12.1  --  12.0  --   PLT 210  --  182  --   CREATININE 1.38* 1.31*  --  1.17*   Estimated Creatinine Clearance: 55.7 mL/min (by C-G formula based on Cr of 1.17).  No results for input(s): VANCOTROUGH, VANCOPEAK, VANCORANDOM, GENTTROUGH, GENTPEAK, GENTRANDOM, TOBRATROUGH, TOBRAPEAK, TOBRARND, AMIKACINPEAK, AMIKACINTROU, AMIKACIN in the last 72 hours.   Microbiology: Recent Results (from the past 720 hour(s))  Urine culture     Status: None   Collection Time: 09/05/14  8:15 PM  Result Value Ref Range Status   Specimen Description URINE, CATHETERIZED  Final   Special Requests NONE  Final   Culture   Final    >=100,000 COLONIES/mL ESCHERICHIA COLI Performed at [facility]    Report Status 09/08/2014 FINAL  Final   Organism ID, Bacteria ESCHERICHIA COLI  Final      Susceptibility   Escherichia coli - MIC*    AMPICILLIN >=32 RESISTANT Resistant     CEFAZOLIN <=4 SENSITIVE Sensitive     CEFTRIAXONE <=1 SENSITIVE Sensitive     CIPROFLOXACIN 1 SENSITIVE Sensitive    GENTAMICIN <=1 SENSITIVE Sensitive     IMIPENEM <=0.25 SENSITIVE Sensitive     NITROFURANTOIN <=16 SENSITIVE Sensitive     TRIMETH/SULFA >=320 RESISTANT Resistant     AMPICILLIN/SULBACTAM 16 INTERMEDIATE Intermediate     PIP/TAZO <=4 SENSITIVE Sensitive     * >=100,000 COLONIES/mL ESCHERICHIA COLI  Culture, blood (x 2)     Status: None (Preliminary result)   Collection Time: 09/05/14 11:05 PM  Result Value Ref Range Status   Specimen Description BLOOD RIGHT HAND  Final   Special Requests BOTTLES DRAWN AEROBIC AND ANAEROBIC 10CC EACH  Final   Culture NO GROWTH 3 DAYS  Final   Report Status PENDING  Incomplete  MRSA PCR Screening     Status: None   Collection Time: 09/05/14 11:07 PM  Result Value Ref Range Status   MRSA by PCR NEGATIVE NEGATIVE Final    Comment:        The GeneXpert MRSA Assay (FDA approved for NASAL specimens only), is one component of a comprehensive MRSA colonization surveillance program. It is not intended to diagnose MRSA infection nor to guide or monitor treatment for MRSA infections.   Culture, blood (x 2)     Status: None (Preliminary result)   Collection Time: 09/05/14 11:15 PM  Result Value Ref Range Status   Specimen Description BLOOD RIGHT FOREARM  Final   Special Requests BOTTLES DRAWN AEROBIC AND ANAEROBIC [facility] EACH  Final  Culture NO GROWTH 3 DAYS  Final   Report Status PENDING  Incomplete   Medical History: Past Medical History  Diagnosis Date  . Diabetes mellitus   . DVT (deep venous thrombosis)     multiple  . Neuropathy   . Narcolepsy   . Cataplexy   . GERD (gastroesophageal reflux disease)   . Hiatal hernia   . Hypertension   . Shortness of breath    Assessment: Okay for Protocol, PCN allergy noted has receive Rocephin in the past per EMR records.  Obese patient.  E Coli in urine SENS to Rocephin and Cipro.  Consider switch to PO cipro to complete Rx.  Anti-infectives    Start     Dose/Rate Route Frequency Ordered Stop    09/06/14 2200  cefTRIAXone (ROCEPHIN) 2 g in dextrose 5 % 50 mL IVPB     2 g 100 mL/hr over 30 Minutes Intravenous Every 24 hours 09/05/14 2301     09/05/14 2330  cefTRIAXone (ROCEPHIN) 1 g in dextrose 5 % 50 mL IVPB     1 g 100 mL/hr over 30 Minutes Intravenous  Once 09/05/14 2248 09/06/14 0000   09/05/14 2145  cefTRIAXone (ROCEPHIN) 1 g in dextrose 5 % 50 mL IVPB     1 g 100 mL/hr over 30 Minutes Intravenous  Once 09/05/14 2138 09/05/14 2249     Goal of Therapy:  Eradicate infection.   Plan:  Continue Rocephin Consider switch to PO Cipro to complete Rx Duration of therapy per MD - anticipate 5 days  Wayland Denis, RPH 09/08/2014,12:45 PM

## 2014-09-09 LAB — PROTIME-INR
INR: 2.03 — ABNORMAL HIGH (ref 0.00–1.49)
PROTHROMBIN TIME: 22.8 s — AB (ref 11.6–15.2)

## 2014-09-09 LAB — BASIC METABOLIC PANEL
ANION GAP: 12 (ref 5–15)
BUN: 22 mg/dL — ABNORMAL HIGH (ref 6–20)
CHLORIDE: 101 mmol/L (ref 101–111)
CO2: 26 mmol/L (ref 22–32)
Calcium: 8.3 mg/dL — ABNORMAL LOW (ref 8.9–10.3)
Creatinine, Ser: 1.03 mg/dL — ABNORMAL HIGH (ref 0.44–1.00)
GFR calc Af Amer: >60 mL/min (ref >60)
GFR calc non Af Amer: 52 mL/min — ABNORMAL LOW (ref >60)
GLUCOSE: 166 mg/dL — AB (ref 65–99)
Potassium: 4.2 mmol/L (ref 3.5–5.1)
Sodium: 139 mmol/L (ref 135–145)

## 2014-09-09 LAB — GLUCOSE, CAPILLARY
Glucose-Capillary: 159 mg/dL — ABNORMAL HIGH (ref 65–99)
Glucose-Capillary: 223 mg/dL — ABNORMAL HIGH (ref 65–99)

## 2014-09-09 MED ORDER — FUROSEMIDE 40 MG PO TABS: 20.0000 mg | ORAL_TABLET | Freq: Two times a day (BID) | ORAL | Status: DC

## 2014-09-09 NOTE — Discharge Summary (Signed)
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 [facility] 37   Acute on chronic respiratory failure with hypoxemia   Elevated troponin   Lower leg DVT (deep venous thromboembolism), chronic   Essential hypertension   Depression   AKI (acute kidney injury)   Poorly controlled diabetes mellitus   Hypomagnesemia   Hyponatremia   Pyelonephritis   Acute diastolic CHF    Mild aortic stenosis   Mild to moderate  mitral stenosis   Chronic anticoagulation-Coumadin   Discharge Condition: Good  Filed Weights   09/05/14 2215 09/07/14 0400 09/08/14 0500  Weight: 246 lb 4.1 oz (111.7 kg) 249 lb 12.5 oz (113.3 kg) 249 lb 9 oz (113.2 kg)    History of present illness: She was admitted with increasing dyspnea some desaturation down to 84% on room air which is multifactorial due to pickwickian syndrome possible sleep apnea as well as possible intravascular by overload while in hospital troponins were flat although mildly elevated seen in consultation by cardiology 2-D echo revealed normal systolic function ejection range 65-70% with grade 1 diastolic dysfunction and concomitant mild mitral stenosis with mild mitral regurgitation with concomitant mild aortic stenosis she is placed on diuresis and continues to well she had no further desaturations and ambulated well she was continued on Coumadin anticoagulation and resumed her Cardizem CD 180 without any negative hemodynamic impact she will follow-up in the office in 3 days' time she is to use incentive spirometry at home she likewise had a UTI was treated  with a 4 day course of IV Rocephin and this was sufficient  Hospital Course:  See history of present illness above  Procedures:    Consultations:  Cardiology  Discharge Instructions     Medication List    ASK your doctor about these medications        benazepril-hydrochlorthiazide 20-25 MG per tablet  Commonly known as:  LOTENSIN HCT  Take 1 tablet by mouth 2 (two) times daily.     BIOTIN MAXIMUM STRENGTH 10 MG Tabs  Generic drug:  Biotin  Take 1 tablet by mouth daily.     celecoxib 200 MG capsule  Commonly known as:  CELEBREX  Take 200 mg by mouth daily as needed. For arthritis     clomiPRAMINE 25 MG capsule  Commonly known as:  ANAFRANIL  Take 25 mg by mouth 4 (four) times daily.     clonazePAM 2 MG tablet  Commonly known as:  KLONOPIN  Take 2 mg by mouth at bedtime.     Co Q10 200 MG Caps  Take 1 capsule by mouth daily.     diltiazem 180 MG 24 hr capsule  Commonly known as:  CARDIZEM CD  Take 2 capsules (360 mg total) by mouth daily.     DULoxetine 60 MG capsule  Commonly known as:  CYMBALTA  Take 60 mg by mouth daily.     FERROCITE 324 (106 FE) MG Tabs  Generic drug:  Ferrous Fumarate  Take 1 capsule by mouth 3 (three) times a week.     HYALURONIC ACID PO  Take 100 mg by mouth daily.  insulin glargine 100 UNIT/ML injection  Commonly known as:  LANTUS  Inject 36 Units into the skin at bedtime.     Magnesium 300 MG Caps  Take 600 mg by mouth daily.     metFORMIN 500 MG tablet  Commonly known as:  GLUCOPHAGE  Take 500 mg by mouth 2 (two) times daily with a meal.     mirtazapine 15 MG tablet  Commonly known as:  REMERON  Take 15 mg by mouth at bedtime.     omeprazole 20 MG capsule  Commonly known as:  PRILOSEC  TAKE 1 CAPSULE BY MOUTH TWICE DAILY FOR ACID REFLUX     oxyCODONE 5 MG immediate release tablet  Commonly known as:  Oxy IR/ROXICODONE  Take 5 mg by mouth 2 (two) times daily as needed. For pain     potassium chloride 10 MEQ  CR capsule  Commonly known as:  MICRO-K  Take 10 mEq by mouth 4 (four) times daily.     PROBIOTIC ADVANCED PO  Take 1 capsule by mouth daily.     QUEtiapine 100 MG tablet  Commonly known as:  SEROQUEL  Take 100 mg by mouth at bedtime.     RA KRILL OIL 500 MG Caps  Take 1 capsule by mouth daily.     saxagliptin HCl 2.5 MG Tabs tablet  Commonly known as:  ONGLYZA  Take 5 mg by mouth daily.     Sennosides 25 MG Tabs  Take 25 mg by mouth daily.     vitamin C with rose hips 500 MG tablet  Take 500 mg by mouth daily.     Vitamin D3 3000 UNITS Tabs  Take 1 capsule by mouth daily.     cholecalciferol 1000 UNITS tablet  Commonly known as:  VITAMIN D  Take 1,000 Units by mouth daily.     warfarin 5 MG tablet  Commonly known as:  COUMADIN  See written post operative Coumadin instructions.       Allergies  Allergen Reactions  . Codeine Anxiety  . Compazine Anaphylaxis  . Other Anaphylaxis, Rash and Other (See Comments)    Uncoded Allergy. Allergen: INOVAR Uncoded Allergy. Allergen: ALL ADHESIVES Uncoded Allergy. Allergen: SILK SUTURE Uncoded Allergy. Allergen: merthiolate Thermasol  . Penicillins Shortness Of Breath and Rash  . Morphine And Related Other (See Comments)    Patient states that it makes her lose her state of mind.       The results of significant diagnostics from this hospitalization (including imaging, microbiology, ancillary and laboratory) are listed below for reference.    Significant Diagnostic Studies: Dg Chest Port 1 View  09/05/2014   CLINICAL DATA:  Shortness of breath since yesterday.  EXAM: PORTABLE CHEST - 1 VIEW  COMPARISON:  10/29/2010  FINDINGS: Limited examination due to body habitus and low lung volumes. The heart is enlarged. The mediastinal and hilar contours are prominent. Low lung volumes with vascular crowding and atelectasis. Could not exclude mild pulmonary edema. No large pleural effusions.  IMPRESSION: Limited examination due to  body habitus.  Low lung volumes with vascular congestion, vascular crowding and atelectasis. Could not exclude mild edema.   Electronically Signed   By: Rudie Meyer M.D.   On: 09/05/2014 19:09    Microbiology: Recent Results (from the past 240 hour(s))  Urine culture     Status: None   Collection Time: 09/05/14  8:15 PM  Result Value Ref Range Status   Specimen Description URINE, CATHETERIZED  Final  Special Requests NONE  Final   Culture   Final    >=100,000 COLONIES/mL ESCHERICHIA COLI Performed at [facility]    Report Status 09/08/2014 FINAL  Final   Organism ID, Bacteria ESCHERICHIA COLI  Final      Susceptibility   Escherichia coli - MIC*    AMPICILLIN >=32 RESISTANT Resistant     CEFAZOLIN <=4 SENSITIVE Sensitive     CEFTRIAXONE <=1 SENSITIVE Sensitive     CIPROFLOXACIN 1 SENSITIVE Sensitive     GENTAMICIN <=1 SENSITIVE Sensitive     IMIPENEM <=0.25 SENSITIVE Sensitive     NITROFURANTOIN <=16 SENSITIVE Sensitive     TRIMETH/SULFA >=320 RESISTANT Resistant     AMPICILLIN/SULBACTAM 16 INTERMEDIATE Intermediate     PIP/TAZO <=4 SENSITIVE Sensitive     * >=100,000 COLONIES/mL ESCHERICHIA COLI  Culture, blood (x 2)     Status: None (Preliminary result)   Collection Time: 09/05/14 11:05 PM  Result Value Ref Range Status   Specimen Description BLOOD RIGHT HAND  Final   Special Requests BOTTLES DRAWN AEROBIC AND ANAEROBIC 10CC EACH  Final   Culture NO GROWTH 3 DAYS  Final   Report Status PENDING  Incomplete  MRSA PCR Screening     Status: None   Collection Time: 09/05/14 11:07 PM  Result Value Ref Range Status   MRSA by PCR NEGATIVE NEGATIVE Final    Comment:        The GeneXpert MRSA Assay (FDA approved for NASAL specimens only), is one component of a comprehensive MRSA colonization surveillance program. It is not intended to diagnose MRSA infection nor to guide or monitor treatment for MRSA infections.   Culture, blood (x 2)     Status: None (Preliminary  result)   Collection Time: 09/05/14 11:15 PM  Result Value Ref Range Status   Specimen Description BLOOD RIGHT FOREARM  Final   Special Requests BOTTLES DRAWN AEROBIC AND ANAEROBIC 8CC EACH  Final   Culture NO GROWTH 3 DAYS  Final   Report Status PENDING  Incomplete     Labs: Basic Metabolic Panel:  Recent Labs Lab 09/05/14 1913 09/07/14 0438 09/08/14 0435 09/09/14 0513  NA 131* 134* 137 139  K 4.6 4.2 4.3 4.2  CL 98* 104 104 101  CO2 24 23 24 26   GLUCOSE 246* 166* 175* 166*  BUN 28* 25* 22* 22*  CREATININE 1.38* 1.31* 1.17* 1.03*  CALCIUM 8.5* 7.8* 8.0* 8.3*  MG 2.3 2.4  --   --    Liver Function Tests:  Recent Labs Lab 09/05/14 1913  AST 18  ALT 18  ALKPHOS 53  BILITOT 0.8  PROT 6.6  ALBUMIN 3.3*   No results for input(s): LIPASE, AMYLASE in the last 168 hours. No results for input(s): AMMONIA in the last 168 hours. CBC:  Recent Labs Lab 09/05/14 1913 09/07/14 1539  WBC 19.5* 11.3*  NEUTROABS 16.1* 8.3*  HGB 12.1 12.0  HCT 36.2 36.7  MCV 86.8 89.3  PLT 210 182   Cardiac Enzymes:  Recent Labs Lab 09/05/14 1913 09/05/14 2216 09/06/14 0400 09/06/14 1042 09/06/14 1630  TROPONINI 0.11* 0.12* 0.08* 0.08* 0.08*   BNP: BNP (last 3 results)  Recent Labs  09/05/14 1913  BNP 631.0*    ProBNP (last 3 results) No results for input(s): PROBNP in the last 8760 hours.  CBG:  Recent Labs Lab 09/08/14 0745 09/08/14 1107 09/08/14 1605 09/08/14 2048 09/09/14 0736  GLUCAP 150* 183* 167* 165* 159*  Signed:  Zahli Vetsch Judie Petit  [facility] Pager: [phone] 09/09/2014, 11:56 AM

## 2014-09-09 NOTE — Progress Notes (Signed)
Subjective:  Breathing better, still on O2-(not on O2 at home)  Objective:  Vital Signs in the last 24 hours: Temp:  [98 F (36.7 C)-98.5 F (36.9 C)] 98 F (36.7 C) (08/05 0358) Pulse Rate:  [78-89] 78 (08/05 0358) Resp:  [18-30] 21 (08/05 0358) BP: (121-134)/(48-80) 130/54 mmHg (08/05 0358) SpO2:  [86 %-97 %] 91 % (08/05 0737) FiO2 (%):  [35 %] 35 % (08/05 0358)  Intake/Output from previous day:  Intake/Output Summary (Last 24 hours) at 09/09/14 0932 Last data filed at 09/09/14 0501  Gross per 24 hour  Intake   1460 ml  Output   4325 ml  Net  -2865 ml    Physical Exam: General appearance: alert, cooperative, no distress and morbidly obese Neck: no carotid bruit and no JVD Lungs: decreased at bases Heart: regular rate and rhythm and 2/6 AS murmur Extremities: extremities normal, atraumatic, no cyanosis or edema   Rate: 80  Rhythm: Not on telemetry  Lab Results:  Recent Labs  09/07/14 1539  WBC 11.3*  HGB 12.0  PLT 182    Recent Labs  09/08/14 0435 09/09/14 0513  NA 137 139  K 4.3 4.2  CL 104 101  CO2 24 26  GLUCOSE 175* 166*  BUN 22* 22*  CREATININE 1.17* 1.03*    Recent Labs  09/06/14 1042 09/06/14 1630  TROPONINI 0.08* 0.08*    Recent Labs  09/09/14 0513  INR 2.03*    Scheduled Meds: . cefTRIAXone (ROCEPHIN)  IV  2 g Intravenous Q24H  . clomiPRAMINE  25 mg Oral QID  . diltiazem  180 mg Oral Daily  . DULoxetine  60 mg Oral Daily  . FLORA-Q   Oral Daily  . furosemide  40 mg Oral BID  . insulin aspart  0-20 Units Subcutaneous TID WC  . insulin aspart  0-5 Units Subcutaneous QHS  . levalbuterol  1.25 mg Nebulization TID  . magnesium oxide  600 mg Oral Daily  . mirtazapine  15 mg Oral QHS  . pantoprazole  40 mg Oral BID AC  . pneumococcal 23 valent vaccine  0.5 mL Intramuscular Tomorrow-1000  . potassium chloride  10 mEq Oral Daily  . QUEtiapine  100 mg Oral QHS  . sodium chloride  3 mL Intravenous Q12H  . Warfarin -  Pharmacist Dosing Inpatient   Does not apply Q24H   Continuous Infusions: . sodium chloride 100 mL/hr at 09/08/14 1100   PRN Meds:.acetaminophen **OR** acetaminophen, chlorpheniramine-HYDROcodone, clonazePAM, ondansetron **OR** ondansetron (ZOFRAN) IV, oxyCODONE   Imaging: Imaging results have been reviewed  Cardiac Studies: Echo 09/06/14 Study Conclusions  - Left ventricle: The cavity size was normal. There was mild concentric hypertrophy. Systolic function was vigorous. The estimated ejection fraction was in the range of 65% to 70%. Wall motion was normal; there were no regional wall motion abnormalities. Doppler parameters are consistent with abnormal left ventricular relaxation (grade 1 diastolic dysfunction). Doppler parameters are consistent with high ventricular filling pressure. - Ventricular septum: Septal motion showed abnormal function and dyssynergy. These changes are consistent with intraventricular conduction delay. - Aortic valve: Moderately calcified annulus. Trileaflet; mildly thickened, mildly calcified leaflets. There was mild stenosis. Peak velocity (S): 221 cm/s. Mean gradient (S): 12 mm Hg. Valve area (VTI): 1.82 cm^2. Valve area (Vmax): 1.66 cm^2. Valve area (Vmean): 1.61 cm^2. - Mitral valve: Moderately to severely calcified annulus. Mildly thickened leaflets . The findings are consistent with mild to moderate stenosis. There was mild regurgitation. Valve area by pressure  half-time: 2.16 cm^2. Valve area by continuity equation (using LVOT flow): 1.01 cm^2. - Left atrium: The atrium was severely dilated. - Tricuspid valve: There was moderate regurgitation. - Pulmonary arteries: PA peak pressure: 49 mm Hg (S). Moderately elevated pulmonary pressures.  Assessment/Plan:  74 year old obese female with no prior cardiac history, brought to the emergency room 09/05/14 with acute respiratory failure likely multifactorial  secondary to diastolic heart failure and pneumonia.  She has improved symptomatically on Lasix 40 mg PO BID  (I/O - 2.9L) but is not yet at baseline.   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  MD to see.  Corine Shelter PA-C 09/09/2014, 9:32 AM [phone]  The patient was seen and examined, and I agree with the assessment and plan as documented above, with modifications as noted below.  Doing better and out of ICU. Had another 2.9L output on Lasix 40 mg bid.   Admitted with urosepsis with E. Coli (with altered sensorium) and acute hypoxic respiratory failure with CHF and valvular heart disease with mild aortic stenosis and mild to moderate mitral stenosis with mild mitral regurgitation, moderate tricuspid regurgitation, moderately elevated pulmonary pressures, and grade 1 diastolic dysfunction with high filling pressures.   Desaturated at night in past and I suspect there is likely a component of sleep apnea as well. Her mentation is clear.  Currently on Lasix 40 mg bid. I would recommend possibly switching to 40 mg daily at time of discharge with supplemental KCl.  Troponin elevation not indicative of acute coronary syndrome given overall flat profile. Repeat CBC on 8/3 showed leukocytosis is trending downward. If unable to wean off O2, consider chest CT but not needed at this time.  I explained monitoring valvular heart disease as outpatient to patient and family members yesterday (no indication for  invasive therapy at this stage).  No further recommendations. Will sign off. Will arrange for follow up in our office.

## 2014-09-09 NOTE — Progress Notes (Signed)
Pt IV and foley catheter removed, pt tolerated well.  Reviewed discharge instructions with pt, answered all questions at this time.  Will continue to monitor pt until leaves the floor.

## 2014-09-09 NOTE — Evaluation (Addendum)
Physical Therapy Evaluation Patient Details Name: Rebecca [name] MRN: [id number] DOB: [birth year] Today's Date: 09/09/2014   History of Present Illness  Shortness of breath. Started 1 day ago. Getting worse. Fairly constant. Associated with nonproductive cough increased fatigue and decreased sleep overnight. Of note, Patient has chronic severe narcolepsy. Patient is a resident at a nursing home and EMS was called to evaluate her. At time of EMS arrival patient's O2 sats were 84% she states on 4 L nasal cannula and O2 sats rose to 98%. No recent changes in medications or daily activities. She reports that her routine PCP checkup on week ago was normal. Patient has not tried anything for her symptoms. Nothing other than resting makes her symptoms better.  Pt was in the ICU initially but is now on the med-surg floor.  At baseline, her son lives with her and she is independent in the home with a walker.  Clinical Impression   Pt was seen for evaluation.  She was alert and oriented, very uncomfortable in the bed.  She was on 2 L O2 with an O2 sat of 96%.  Supplemental O2 was removed during PT visit.  Pt is found to be generally weak and deconditioned with severe OA of the right knee which is very painful with weight bearing.  She was able to transfer supine to sit independently but needed min assist to stand with a walker.  She was unable to ambulate due to pain in the right knee during weight bearing but she was able to transfer to a chair using the walker.  Her O2 sat on room air with activity = 91%.  I have strongly encouraged her to consider going to SNF at d/c but she flatly refuses.  She is certain that once she gets home that she will be able to move about as she would like.  I have recommended that she at least have a w/c and she stated that she can get one from a family member.    Follow Up Recommendations Home health PT    Equipment Recommendations  None recommended by PT    Recommendations for  Other Services   none    Precautions / Restrictions Precautions Precautions: Fall Restrictions Weight Bearing Restrictions: No Other Position/Activity Restrictions: pt has significant pain in the right knee with weight bearing      Mobility  Bed Mobility Overal bed mobility: Modified Independent             General bed mobility comments: tansfer is very labored but without assist  Transfers Overall transfer level: Needs assistance Equipment used: Rolling walker (2 wheeled) Transfers: Sit to/from Stand Sit to Stand: Min assist            Ambulation/Gait Ambulation/Gait assistance: Supervision Ambulation Distance (Feet): 2 Feet Assistive device: Rolling walker (2 wheeled)       General Gait Details: unable to assess gait as pt is only able to use the walker to pivot to a chair...she has too much right knee pain with weight bearing in order to walk  Stairs            Wheelchair Mobility    Modified Rankin (Stroke Patients Only)       Balance Overall balance assessment:  (good sitting balance, unable to assess standing balance)  Pertinent Vitals/Pain Pain Assessment: No/denies pain    Home Living Family/patient expects to be discharged to:: Private residence Living Arrangements: Children Available Help at Discharge: Family;Available PRN/intermittently Type of Home: House Home Access: Ramped entrance     Home Layout: One level Home Equipment: Walker - 2 wheels Additional Comments: last PT note indicated that pt had a BSC but pt states that she does not have one nor does she want one    Prior Function Level of Independence: Independent with assistive device(s)         Comments: ambulates with a walker     Hand Dominance        Extremity/Trunk Assessment   Upper Extremity Assessment: Generalized weakness           Lower Extremity Assessment: RLE  deficits/detail;Generalized weakness RLE Deficits / Details: decreased strength in the quadriceps due to OA (3-/5)       Communication   Communication: No difficulties  Cognition Arousal/Alertness: Awake/alert Behavior During Therapy: WFL for tasks assessed/performed Overall Cognitive Status: Within Functional Limits for tasks assessed                      General Comments      Exercises        Assessment/Plan    PT Assessment Patient needs continued PT services  PT Diagnosis Difficulty walking;Generalized weakness;Acute pain   PT Problem List Decreased strength;Decreased range of motion;Decreased activity tolerance;Decreased mobility;Cardiopulmonary status limiting activity;Obesity  PT Treatment Interventions Gait training;Functional mobility training;Therapeutic exercise   PT Goals (Current goals can be found in the Care Plan section) Acute Rehab PT Goals Patient Stated Goal: wants to return home PT Goal Formulation: With patient Time For Goal Achievement: 09/23/14 Potential to Achieve Goals: Fair    Frequency Min 3X/week   Barriers to discharge Decreased caregiver support son works during the day    Co-evaluation               End of Session Equipment Utilized During Treatment: Gait belt Activity Tolerance: Patient limited by fatigue;Patient limited by pain Patient left: in chair;with call bell/phone within reach           Time: 0928-1004 PT Time Calculation (min) (ACUTE ONLY): 36 min   Charges:   PT Evaluation $Initial PT Evaluation Tier I: 1 Procedure     PT G CodesKonrad Penta  PT 09/09/2014, 10:17 AM [phone]

## 2014-09-09 NOTE — Care Management Important Message (Signed)
Important Message  Patient Details  Name: MERCEDA RIESGO MRN: [id number] Date of Birth: October 24, 1940   Medicare Important Message Given:  Yes-third notification given    Cheryl Flash, RN 09/09/2014, 8:23 AM

## 2014-09-09 NOTE — Care Management Note (Signed)
Case Management Note  Patient Details  Name: Rebecca [name] MRN: [id number] Date of Birth: Dec 29, 1940  Subjective/Objective:                    Action/Plan:   Expected Discharge Date:                  Expected Discharge Plan:  Home/Self Care  In-House Referral:  NA  Discharge planning Services  CM Consult  Post Acute Care Choice:  NA Choice offered to:  NA  DME Arranged:    DME Agency:     HH Arranged:    HH Agency:     Status of Service:  Completed, signed off  Medicare Important Message Given:  Yes-third notification given Date Medicare IM Given:    Medicare IM give by:    Date Additional Medicare IM Given:    Additional Medicare Important Message give by:     If discussed at Long Length of Stay Meetings, dates discussed:    Additional Comments: Pt discharged home today. PT recommended HH PT at discharge. Pts PCP stated he would arrange the PT from his office on Tuesday if pt needed it. Pt did not qualify for home O2 at this time. Pt and pts nurse aware of discharge arrangements. Arlyss [place], RN 09/09/2014, 2:23 PM

## 2014-09-10 LAB — CULTURE, BLOOD (ROUTINE X 2)
Culture: NO GROWTH
Culture: NO GROWTH

## 2014-09-14 ENCOUNTER — Other Ambulatory Visit (INDEPENDENT_AMBULATORY_CARE_PROVIDER_SITE_OTHER): Payer: Self-pay | Admitting: Internal Medicine

## 2014-09-21 ENCOUNTER — Other Ambulatory Visit (HOSPITAL_COMMUNITY)
Admission: RE | Admit: 2014-09-21 | Discharge: 2014-09-21 | Disposition: A | Discharge status: Home / Self Care | Payer: Medicare Other | Source: Ambulatory Visit | Attending: Physician Assistant | Admitting: Physician Assistant

## 2014-09-21 ENCOUNTER — Telehealth: Payer: Self-pay

## 2014-09-21 ENCOUNTER — Ambulatory Visit (INDEPENDENT_AMBULATORY_CARE_PROVIDER_SITE_OTHER): Payer: Medicare Other | Admitting: Physician Assistant

## 2014-09-21 ENCOUNTER — Encounter: Payer: Self-pay | Admitting: Physician Assistant

## 2014-09-21 VITALS — BP 120/74 | HR 75 | Ht 68.0 in | Wt 238.0 lb

## 2014-09-21 DIAGNOSIS — I5032 Chronic diastolic (congestive) heart failure: Secondary | ICD-10-CM

## 2014-09-21 DIAGNOSIS — I350 Nonrheumatic aortic (valve) stenosis: Secondary | ICD-10-CM

## 2014-09-21 DIAGNOSIS — Z79899 Other long term (current) drug therapy: Secondary | ICD-10-CM | POA: Diagnosis not present

## 2014-09-21 DIAGNOSIS — Y92009 Unspecified place in unspecified non-institutional (private) residence as the place of occurrence of the external cause: Secondary | ICD-10-CM | POA: Insufficient documentation

## 2014-09-21 DIAGNOSIS — I10 Essential (primary) hypertension: Secondary | ICD-10-CM | POA: Diagnosis not present

## 2014-09-21 DIAGNOSIS — Z7901 Long term (current) use of anticoagulants: Secondary | ICD-10-CM

## 2014-09-21 DIAGNOSIS — W19XXXA Unspecified fall, initial encounter: Secondary | ICD-10-CM

## 2014-09-21 DIAGNOSIS — Y92099 Unspecified place in other non-institutional residence as the place of occurrence of the external cause: Secondary | ICD-10-CM

## 2014-09-21 LAB — PROTIME-INR
INR: 1.74 — AB (ref 0.00–1.49)
PROTHROMBIN TIME: 20.3 s — AB (ref 11.6–15.2)

## 2014-09-21 LAB — BASIC METABOLIC PANEL
ANION GAP: 11 (ref 5–15)
BUN: 34 mg/dL — ABNORMAL HIGH (ref 6–20)
CALCIUM: 9 mg/dL (ref 8.9–10.3)
CO2: 27 mmol/L (ref 22–32)
Chloride: 95 mmol/L — ABNORMAL LOW (ref 101–111)
Creatinine, Ser: 1.58 mg/dL — ABNORMAL HIGH (ref 0.44–1.00)
GFR, EST AFRICAN AMERICAN: 36 mL/min — AB (ref >60)
GFR, EST NON AFRICAN AMERICAN: 31 mL/min — AB (ref >60)
Glucose, Bld: 162 mg/dL — ABNORMAL HIGH (ref 65–99)
Potassium: 5.1 mmol/L (ref 3.5–5.1)
Sodium: 133 mmol/L — ABNORMAL LOW (ref 135–145)

## 2014-09-21 MED ORDER — FUROSEMIDE 20 MG PO TABS: 20.0000 mg | ORAL_TABLET | Freq: Every day | ORAL | Status: DC

## 2014-09-21 MED ORDER — POTASSIUM CHLORIDE ER 10 MEQ PO TBCR: 10.0000 meq | EXTENDED_RELEASE_TABLET | Freq: Every day | ORAL | Status: DC

## 2014-09-21 NOTE — Assessment & Plan Note (Signed)
Asymptomatic. Follow valvular heart disease closely

## 2014-09-21 NOTE — Progress Notes (Signed)
Cardiology Office Note   Date:  09/21/2014   ID:  Rebecca [name], DOB 1940/09/14, MRN [id number]  PCP:  Isabella Stalling, MD  Cardiologist:  Dr. Purvis Sheffield  Chief Complaint: Post hospital follow-up/fall    History of Present Illness: Rebecca [name] is a 74 y.o. female who presents for post hospital follow-up. Patient had no prior cardiac history and came to the ER 09/05/14 with acute respiratory failure likely multifactorial secondary to diastolic heart failure and pneumonia, and urosepsis with Escherichia coli. She improved with diuresis. 2-D echo showed normal systolic function EF 65-70% with grade 1 diastolic dysfunction. She also had mild aortic stenosis, mild to moderate mitral stenosis and mild mitral regurgitation with a severely dilated left atrium. Troponins were elevated but flat.  Patient also has a history of hypertension, chronic DVTs on Coumadin, diabetes mellitus, narcolepsy and depression.  Patient comes in today breathing much easier. She denies any chest pain, palpitations, edema. She has chronic dyspnea on exertion from her COPD but says she is breathing much better. She did have a fall when the rolling cart that she uses collapsed. She hit her knee which is quite bruised. She did call 911 but refused to go to the hospital. She has not had an INR checked since discharge.  Past Medical History  Diagnosis Date  . Diabetes mellitus   . DVT (deep venous thrombosis)     multiple  . Neuropathy   . Narcolepsy   . Cataplexy   . GERD (gastroesophageal reflux disease)   . Hiatal hernia   . Hypertension   . Shortness of breath     Past Surgical History  Procedure Laterality Date  . Abdominal hysterectomy    . Hemorroidectomy    . Carpal tunnel release    . Cholecystectomy    . Kidney surgery    . Total hip arthroplasty      bilateral  . Nephrostomy    . Carpectomy  12/26/2011    Procedure: CARPECTOMY;  Surgeon: Wyn Forster., MD;  Location: [facility];  Service: Orthopedics;  Laterality: Right;  PROXIMAL ROW CARPECTOMY RIGHT WRIST and neurectomy     Current Outpatient Prescriptions  Medication Sig Dispense Refill  . Ascorbic Acid (VITAMIN C WITH ROSE HIPS) 500 MG tablet Take 500 mg by mouth daily.    . benazepril-hydrochlorthiazide (LOTENSIN HCT) 20-25 MG per tablet Take 1 tablet by mouth 2 (two) times daily.     . Biotin (BIOTIN MAXIMUM STRENGTH) 10 MG TABS Take 1 tablet by mouth daily.    . celecoxib (CELEBREX) 200 MG capsule Take 200 mg by mouth daily as needed. For arthritis    . Cholecalciferol (VITAMIN D3) 3000 UNITS TABS Take 1 capsule by mouth daily.    . clomiPRAMINE (ANAFRANIL) 25 MG capsule Take 25 mg by mouth 4 (four) times daily.     . clonazePAM (KLONOPIN) 2 MG tablet Take 2 mg by mouth at bedtime.    . Coenzyme Q10 (CO Q10) 200 MG CAPS Take 1 capsule by mouth daily.    Marland Kitchen diltiazem (CARDIZEM CD) 180 MG 24 hr capsule Take 2 capsules (360 mg total) by mouth daily. 30 capsule 3  . DULoxetine (CYMBALTA) 60 MG capsule Take 60 mg by mouth daily.      . Ferrous Fumarate (FERROCITE) 324 (106 FE) MG TABS Take 1 capsule by mouth 3 (three) times a week.    . furosemide (LASIX) 40 MG tablet Take 0.5 tablets (20 mg total)  by mouth 2 (two) times daily. 60 tablet 1  . Hyaluronic Acid-Vitamin C (HYALURONIC ACID PO) Take 100 mg by mouth daily.    . insulin glargine (LANTUS) 100 UNIT/ML injection Inject 36 Units into the skin at bedtime.     . Magnesium 300 MG CAPS Take 600 mg by mouth daily.    . metFORMIN (GLUCOPHAGE) 500 MG tablet Take 500 mg by mouth 2 (two) times daily with a meal.      . mirtazapine (REMERON) 15 MG tablet Take 15 mg by mouth at bedtime.      Marland Kitchen omeprazole (PRILOSEC) 20 MG capsule TAKE 1 CAPSULE BY MOUTH TWICE DAILY 60 capsule 0  . oxyCODONE (OXY IR/ROXICODONE) 5 MG immediate release tablet Take 5 mg by mouth 2 (two) times daily as needed. For pain  0  . potassium chloride (MICRO-K) 10 MEQ CR capsule Take 10 mEq by  mouth 4 (four) times daily.     . Probiotic Product (PROBIOTIC ADVANCED PO) Take 1 capsule by mouth daily.    . QUEtiapine (SEROQUEL) 100 MG tablet Take 100 mg by mouth at bedtime.    . saxagliptin HCl (ONGLYZA) 2.5 MG TABS tablet Take 5 mg by mouth daily.    Marland Kitchen warfarin (COUMADIN) 5 MG tablet See written post operative Coumadin instructions. (Patient taking differently: Take 2.5-5 mg by mouth every morning. Alternate taking 5mg  with 2.5mg  daily) 1 tablet 0  . RA KRILL OIL 500 MG CAPS Take 1 capsule by mouth daily.     No current facility-administered medications for this visit.    Allergies:   Codeine; Compazine; Other; Penicillins; and Morphine and related    Social History:  The patient  reports that she quit smoking about 25 years ago. She has never used smokeless tobacco. She reports that she does not drink alcohol or use illicit drugs.   Family History:  The patient's   family history includes Diabetes in an other family member; Heart disease in an other family member.    ROS:  Please see the history of present illness.   Otherwise, review of systems are positive for walks with a walker, chronic wheezing, knee pain from fall.   All other systems are reviewed and negative.    PHYSICAL EXAM: VS:  BP 120/74 mmHg  Pulse 75  Ht 5\' 8"  (1.727 m)  Wt 238 lb (107.956 kg)  BMI 36.20 kg/m2  SpO2 94% , BMI Body mass index is 36.2 kg/(m^2). GEN: Obese in no acute distress Neck: Murmur versus bruit portrayed in neck no JVD, HJR,  or masses Cardiac:  RRR; 2/6 systolic murmur at the right sternal border, left sternal border, no gallop, rubs, thrill or heave,  Respiratory:  clear to auscultation bilaterally, normal work of breathing GI: soft, nontender, nondistended, + BS MS: no deformity or atrophy Extremities: Left knee swollen and bruised from recent fall bilateral feet with cyanosis and clubbing no edema good distal pulses bilaterally.  Skin: warm and dry, no rash Neuro:  Strength and  sensation are intact    EKG:  EKG is not ordered today.    Recent Labs: 09/05/2014: ALT 18; B Natriuretic Peptide 631.0* 09/07/2014: Hemoglobin 12.0; Magnesium 2.4; Platelets 182 09/09/2014: BUN 22*; Creatinine, Ser 1.03*; Potassium 4.2; Sodium 139    Lipid Panel No results found for: CHOL, TRIG, HDL, CHOLHDL, VLDL, LDLCALC, LDLDIRECT    Wt Readings from Last 3 Encounters:  09/21/14 238 lb (107.956 kg)  09/08/14 249 lb 9 oz (113.2 kg)  07/29/12  248 lb 3.2 oz (112.583 kg)      Other studies Reviewed: Additional studies/ records that were reviewed today include and review of the records demonstrates:  Cardiac Studies: Echo 09/06/14 Study Conclusions  - Left ventricle: The cavity size was normal. There was mild   concentric hypertrophy. Systolic function was vigorous. The   estimated ejection fraction was in the range of 65% to 70%. Wall   motion was normal; there were no regional wall motion   abnormalities. Doppler parameters are consistent with abnormal   left ventricular relaxation (grade 1 diastolic dysfunction).   Doppler parameters are consistent with high ventricular filling   pressure. - Ventricular septum: Septal motion showed abnormal function and   dyssynergy. These changes are consistent with intraventricular   conduction delay. - Aortic valve: Moderately calcified annulus. Trileaflet; mildly   thickened, mildly calcified leaflets. There was mild stenosis.   Peak velocity (S): 221 cm/s. Mean gradient (S): 12 mm Hg. Valve   area (VTI): 1.82 cm^2. Valve area (Vmax): 1.66 cm^2. Valve area   (Vmean): 1.61 cm^2. - Mitral valve: Moderately to severely calcified annulus. Mildly   thickened leaflets . The findings are consistent with mild to   moderate stenosis. There was mild regurgitation. Valve area by   pressure half-time: 2.16 cm^2. Valve area by continuity equation   (using LVOT flow): 1.01 cm^2. - Left atrium: The atrium was severely dilated. - Tricuspid valve:  There was moderate regurgitation. - Pulmonary arteries: PA peak pressure: 49 mm Hg (S). Moderately   elevated pulmonary pressures.     ASSESSMENT AND PLAN:  Chronic diastolic CHF (congestive heart failure) Patient's heart failure is well compensated. She is on Lasix 20 mg twice a day. Blood pressure is well-controlled. Her weight is actually down 11 pounds since hospitalization. Will check renal function today. Follow-up with Dr.Koneswaran in 2 months. Low sodium diet.  Essential hypertension Blood pressure well controlled  Mild aortic stenosis Asymptomatic. Follow valvular heart disease closely  Fall at home Patient had a fall at home when the rolling cart she was using to walk with collapsed. She does have a bruised and swollen left knee. She is on Coumadin and at increased fall risk. We'll check INR and have it sent to Dr. Delbert Harness who follows her Coumadin. Patient was evaluated by EMS and refused to come to the hospital.  Chronic anticoagulation-Coumadin Followed by Dr. Delbert Harness    Signed, Jacolyn Reedy, PA-C  09/21/2014 11:41 AM    [facility] [address] [place], [place], [place]  [address] Phone: [phone]; Fax: [phone]

## 2014-09-21 NOTE — Assessment & Plan Note (Signed)
Blood pressure well controlled

## 2014-09-21 NOTE — Telephone Encounter (Signed)
-----   Message from Dyann Kief, PA-C sent at 09/21/2014  2:36 PM EDT ----- Renal function a little worse. Decrease Lasix to 20 mg once daily and Micro K 10 meq-2daily. Repeat bmet in 1-2 weeks. Call for weight gain of 2-3 pounds overnight. Dr. Janna Arch to manage her coumadin/INR

## 2014-09-21 NOTE — Telephone Encounter (Signed)
After speaking with pt,and Leda Gauze PA-C, pt is to take ONLY 10 meq potassium daily, not 20 meq as first ordered by her.She will decrease lasix to 20 mg daily and repeat BMET in 1-2 weeks.lasix is 20 mg daily.Mailed lab slip to patient.pt will try to buy a scale

## 2014-09-21 NOTE — Assessment & Plan Note (Signed)
Patient's heart failure is well compensated. She is on Lasix 20 mg twice a day. Blood pressure is well-controlled. Her weight is actually down 11 pounds since hospitalization. Will check renal function today. Follow-up with Dr.Koneswaran in 2 months. Low sodium diet.

## 2014-09-21 NOTE — Assessment & Plan Note (Signed)
Patient had a fall at home when the rolling cart she was using to walk with collapsed. She does have a bruised and swollen left knee. She is on Coumadin and at increased fall risk. We'll check INR and have it sent to Dr. Delbert Harness who follows her Coumadin. Patient was evaluated by EMS and refused to come to the hospital.

## 2014-09-21 NOTE — Assessment & Plan Note (Signed)
Followed by Dr. Delbert Harness

## 2014-09-21 NOTE — Patient Instructions (Signed)
Your physician recommends that you schedule a follow-up appointment in: 2 months with Dr. Purvis Sheffield.  Your physician recommends that you have lab work done today.  Low-Sodium Eating Plan Sodium raises blood pressure and causes water to be held in the body. Getting less sodium from food will help lower your blood pressure, reduce any swelling, and protect your heart, liver, and kidneys. We get sodium by adding salt (sodium chloride) to food. Most of our sodium comes from canned, boxed, and frozen foods. Restaurant foods, fast foods, and pizza are also very high in sodium. Even if you take medicine to lower your blood pressure or to reduce fluid in your body, getting less sodium from your food is important. WHAT IS MY PLAN? Most people should limit their sodium intake to 2,300 mg a day. Your health care provider recommends that you limit your sodium intake to _____2,000 mg_____ a day.  WHAT DO I NEED TO KNOW ABOUT THIS EATING PLAN? For the low-sodium eating plan, you will follow these general guidelines:  Choose foods with a % Daily Value for sodium of less than 5% (as listed on the food label).   Use salt-free seasonings or herbs instead of table salt or sea salt.   Check with your health care provider or pharmacist before using salt substitutes.   Eat fresh foods.  Eat more vegetables and fruits.  Limit canned vegetables. If you do use them, rinse them well to decrease the sodium.   Limit cheese to 1 oz (28 g) per day.   Eat lower-sodium products, often labeled as "lower sodium" or "no salt added."  Avoid foods that contain monosodium glutamate (MSG). MSG is sometimes added to [place] food and some canned foods.  Check food labels (Nutrition Facts labels) on foods to learn how much sodium is in one serving.  Eat more home-cooked food and less restaurant, buffet, and fast food.  When eating at a restaurant, ask that your food be prepared with less salt or none, if possible.   HOW DO I READ FOOD LABELS FOR SODIUM INFORMATION? The Nutrition Facts label lists the amount of sodium in one serving of the food. If you eat more than one serving, you must multiply the listed amount of sodium by the number of servings. Food labels may also identify foods as:  Sodium free--Less than 5 mg in a serving.  Very low sodium--35 mg or less in a serving.  Low sodium--140 mg or less in a serving.  Light in sodium--50% less sodium in a serving. For example, if a food that usually has 300 mg of sodium is changed to become light in sodium, it will have 150 mg of sodium.  Reduced sodium--25% less sodium in a serving. For example, if a food that usually has 400 mg of sodium is changed to reduced sodium, it will have 300 mg of sodium. WHAT FOODS CAN I EAT? Grains Low-sodium cereals, including oats, puffed wheat and rice, and shredded wheat cereals. Low-sodium crackers. Unsalted rice and pasta. Lower-sodium bread.  Vegetables Frozen or fresh vegetables. Low-sodium or reduced-sodium canned vegetables. Low-sodium or reduced-sodium tomato sauce and paste. Low-sodium or reduced-sodium tomato and vegetable juices.  Fruits Fresh, frozen, and canned fruit. Fruit juice.  Meat and Other Protein Products Low-sodium canned tuna and salmon. Fresh or frozen meat, poultry, seafood, and fish. Lamb. Unsalted nuts. Dried beans, peas, and lentils without added salt. Unsalted canned beans. Homemade soups without salt. Eggs.  Dairy Milk. Soy milk. Ricotta cheese. Low-sodium or reduced-sodium  cheeses. Yogurt.  Condiments Fresh and dried herbs and spices. Salt-free seasonings. Onion and garlic powders. Low-sodium varieties of mustard and ketchup. Lemon juice.  Fats and Oils Reduced-sodium salad dressings. Unsalted butter.  Other Unsalted popcorn and pretzels.  The items listed above may not be a complete list of recommended foods or beverages. Contact your dietitian for more options. WHAT  FOODS ARE NOT RECOMMENDED? Grains Instant hot cereals. Bread stuffing, pancake, and biscuit mixes. Croutons. Seasoned rice or pasta mixes. Noodle soup cups. Boxed or frozen macaroni and cheese. Self-rising flour. Regular salted crackers. Vegetables Regular canned vegetables. Regular canned tomato sauce and paste. Regular tomato and vegetable juices. Frozen vegetables in sauces. Salted french fries. Olives. Rosita Fire. Relishes. Sauerkraut. Salsa. Meat and Other Protein Products Salted, canned, smoked, spiced, or pickled meats, seafood, or fish. Bacon, ham, sausage, hot dogs, corned beef, chipped beef, and packaged luncheon meats. Salt pork. Jerky. Pickled herring. Anchovies, regular canned tuna, and sardines. Salted nuts. Dairy Processed cheese and cheese spreads. Cheese curds. Blue cheese and cottage cheese. Buttermilk.  Condiments Onion and garlic salt, seasoned salt, table salt, and sea salt. Canned and packaged gravies. Worcestershire sauce. Tartar sauce. Barbecue sauce. Teriyaki sauce. Soy sauce, including reduced sodium. Steak sauce. Fish sauce. Oyster sauce. Cocktail sauce. Horseradish. Regular ketchup and mustard. Meat flavorings and tenderizers. Bouillon cubes. Hot sauce. Tabasco sauce. Marinades. Taco seasonings. Relishes. Fats and Oils Regular salad dressings. Salted butter. Margarine. Ghee. Bacon fat.  Other Potato and tortilla chips. Corn chips and puffs. Salted popcorn and pretzels. Canned or dried soups. Pizza. Frozen entrees and pot pies.  The items listed above may not be a complete list of foods and beverages to avoid. Contact your dietitian for more information. Document Released: 07/13/2001 Document Revised: 01/26/2013 Document Reviewed: 11/25/2012 [facility] Patient Information 2015 [place], [place]. This information is not intended to replace advice given to you by your health care provider. Make sure you discuss any questions you have with your health care  provider.  Thank you for choosing [facility]!

## 2014-09-23 ENCOUNTER — Telehealth: Payer: Self-pay | Admitting: Cardiovascular Disease

## 2014-09-23 NOTE — Telephone Encounter (Signed)
Rebecca [name] called stating that she was recently in [facility].  States that someone called her at home stating that She still had UTI.  Was concerned that she had not received a telephone call.

## 2014-09-23 NOTE — Telephone Encounter (Signed)
Pt will call pcp she said she did not give a U/A sample

## 2014-10-14 ENCOUNTER — Other Ambulatory Visit (INDEPENDENT_AMBULATORY_CARE_PROVIDER_SITE_OTHER): Payer: Self-pay | Admitting: Internal Medicine

## 2014-10-17 ENCOUNTER — Telehealth (INDEPENDENT_AMBULATORY_CARE_PROVIDER_SITE_OTHER): Payer: Self-pay | Admitting: Internal Medicine

## 2014-10-17 ENCOUNTER — Other Ambulatory Visit (INDEPENDENT_AMBULATORY_CARE_PROVIDER_SITE_OTHER): Payer: Self-pay | Admitting: Internal Medicine

## 2014-10-17 MED ORDER — OMEPRAZOLE 20 MG PO CPDR: 20.0000 mg | DELAYED_RELEASE_CAPSULE | Freq: Every day | ORAL | Status: DC

## 2014-10-17 NOTE — Telephone Encounter (Signed)
Rx for Omeprazole sent.

## 2014-11-08 ENCOUNTER — Other Ambulatory Visit: Payer: Self-pay | Admitting: Physician Assistant

## 2014-11-09 LAB — COMPREHENSIVE METABOLIC PANEL
A/G RATIO: 1.5 (ref 1.1–2.5)
ALK PHOS: 76 IU/L (ref 39–117)
ALT: 18 IU/L (ref 0–32)
AST: 21 IU/L (ref 0–40)
Albumin: 4.4 g/dL (ref 3.5–4.8)
BILIRUBIN TOTAL: 0.2 mg/dL (ref 0.0–1.2)
BUN/Creatinine Ratio: 24 (ref 11–26)
BUN: 29 mg/dL — ABNORMAL HIGH (ref 8–27)
CHLORIDE: 93 mmol/L — AB (ref 97–108)
CO2: 25 mmol/L (ref 18–29)
Calcium: 9.7 mg/dL (ref 8.7–10.3)
Creatinine, Ser: 1.22 mg/dL — ABNORMAL HIGH (ref 0.57–1.00)
GFR calc non Af Amer: 43 mL/min/{1.73_m2} — ABNORMAL LOW (ref >59)
GFR, EST AFRICAN AMERICAN: 50 mL/min/{1.73_m2} — AB (ref >59)
GLUCOSE: 204 mg/dL — AB (ref 65–99)
Globulin, Total: 3 g/dL (ref 1.5–4.5)
POTASSIUM: 4.7 mmol/L (ref 3.5–5.2)
Sodium: 137 mmol/L (ref 134–144)
TOTAL PROTEIN: 7.4 g/dL (ref 6.0–8.5)

## 2014-11-15 ENCOUNTER — Other Ambulatory Visit (INDEPENDENT_AMBULATORY_CARE_PROVIDER_SITE_OTHER): Payer: Self-pay | Admitting: Internal Medicine

## 2014-11-21 ENCOUNTER — Encounter: Payer: Self-pay | Admitting: Cardiovascular Disease

## 2014-11-21 ENCOUNTER — Ambulatory Visit (INDEPENDENT_AMBULATORY_CARE_PROVIDER_SITE_OTHER): Payer: Medicare Other | Admitting: Cardiovascular Disease

## 2014-11-21 VITALS — BP 130/78 | HR 84 | Ht 66.0 in | Wt 242.8 lb

## 2014-11-21 DIAGNOSIS — I5032 Chronic diastolic (congestive) heart failure: Secondary | ICD-10-CM | POA: Diagnosis not present

## 2014-11-21 DIAGNOSIS — I38 Endocarditis, valve unspecified: Secondary | ICD-10-CM

## 2014-11-21 DIAGNOSIS — Z79899 Other long term (current) drug therapy: Secondary | ICD-10-CM

## 2014-11-21 DIAGNOSIS — I10 Essential (primary) hypertension: Secondary | ICD-10-CM

## 2014-11-21 DIAGNOSIS — I350 Nonrheumatic aortic (valve) stenosis: Secondary | ICD-10-CM

## 2014-11-21 DIAGNOSIS — M25462 Effusion, left knee: Secondary | ICD-10-CM

## 2014-11-21 DIAGNOSIS — N183 Chronic kidney disease, stage 3 unspecified: Secondary | ICD-10-CM

## 2014-11-21 NOTE — Progress Notes (Signed)
Patient ID: Rebecca [name], female   DOB: 30-Apr-1940, 74 y.o.   MRN: [id number]      SUBJECTIVE: The patient is a 74 year old woman who I performed inpatient cardiac consultation on in August 2016. She was hospitalized for urosepsis with acute hypoxic respiratory failure with both diastolic heart failure and valvular heart disease. Specifically, she had mild aortic stenosis and mild to moderate mitral stenosis and mild mitral regurgitation, moderate tricuspid regurgitation, and moderately elevated pulmonary pressures with grade 1 diastolic dysfunction with high filling pressures, LVEF 65-70% (09/06/14). She likely had a component of sleep apnea as well.  She also has a history of obesity, hypertension, chronic DVTs and takes warfarin, diabetes, narcolepsy, and depression.  Wt 242 lbs today (238 on 09/21/14).  Basic metabolic panel on 11/08/14 showed BUN 29, creatinine 1.22, sodium 137, potassium 4.7. She had a fall and injured her left knee approximate 5 weeks ago and had significant swelling which has gradually been going down.   Denies shortness of breath, lower leg swelling, and chest pain.  Soc: Her son lives with her. She has been widowed since 2010 and had been married for 53 years.   Review of Systems: As per "subjective", otherwise negative.  Allergies  Allergen Reactions  . Codeine Anxiety  . Compazine Anaphylaxis  . Other Anaphylaxis, Rash and Other (See Comments)    Uncoded Allergy. Allergen: INOVAR Uncoded Allergy. Allergen: ALL ADHESIVES Uncoded Allergy. Allergen: SILK SUTURE Uncoded Allergy. Allergen: merthiolate Thermasol  . Penicillins Shortness Of Breath and Rash  . Morphine And Related Other (See Comments)    Patient states that it makes her lose her state of mind.     Current Outpatient Prescriptions  Medication Sig Dispense Refill  . Ascorbic Acid (VITAMIN C WITH ROSE HIPS) 500 MG tablet Take 500 mg by mouth daily.    . benazepril-hydrochlorthiazide (LOTENSIN HCT)  20-25 MG per tablet Take 1 tablet by mouth 2 (two) times daily.     . Biotin (BIOTIN MAXIMUM STRENGTH) 10 MG TABS Take 1 tablet by mouth daily.    . celecoxib (CELEBREX) 200 MG capsule Take 200 mg by mouth daily as needed. For arthritis    . Cholecalciferol (VITAMIN D3) 3000 UNITS TABS Take 1 capsule by mouth daily.    . clomiPRAMINE (ANAFRANIL) 25 MG capsule Take 25 mg by mouth 4 (four) times daily.     . clonazePAM (KLONOPIN) 2 MG tablet Take 2 mg by mouth at bedtime.    . Coenzyme Q10 (CO Q10) 200 MG CAPS Take 1 capsule by mouth daily.    Marland Kitchen diltiazem (CARDIZEM CD) 180 MG 24 hr capsule Take 2 capsules (360 mg total) by mouth daily. 30 capsule 3  . DULoxetine (CYMBALTA) 60 MG capsule Take 60 mg by mouth daily.      . Ferrous Fumarate (FERROCITE) 324 (106 FE) MG TABS Take 1 capsule by mouth 3 (three) times a week.    . furosemide (LASIX) 20 MG tablet Take 1 tablet (20 mg total) by mouth daily. 30 tablet 6  . Hyaluronic Acid-Vitamin C (HYALURONIC ACID PO) Take 100 mg by mouth daily.    . insulin glargine (LANTUS) 100 UNIT/ML injection Inject 36 Units into the skin at bedtime.     . Magnesium 300 MG CAPS Take 600 mg by mouth daily.    . metFORMIN (GLUCOPHAGE) 500 MG tablet Take 500 mg by mouth 2 (two) times daily with a meal.      . mirtazapine (REMERON) 15 MG tablet  Take 15 mg by mouth at bedtime.      Marland Kitchen omeprazole (PRILOSEC) 20 MG capsule Take 1 capsule (20 mg total) by mouth daily. 90 capsule 3  . omeprazole (PRILOSEC) 20 MG capsule TAKE 1 CAPSULE BY MOUTH TWICE DAILY 60 capsule 0  . oxyCODONE (OXY IR/ROXICODONE) 5 MG immediate release tablet Take 5 mg by mouth 2 (two) times daily as needed. For pain  0  . potassium chloride (K-DUR) 10 MEQ tablet Take 1 tablet (10 mEq total) by mouth daily. 90 tablet 3  . Probiotic Product (PROBIOTIC ADVANCED PO) Take 1 capsule by mouth daily.    . QUEtiapine (SEROQUEL) 100 MG tablet Take 100 mg by mouth at bedtime.    Marland Kitchen RA KRILL OIL 500 MG CAPS Take 1 capsule  by mouth daily.    . saxagliptin HCl (ONGLYZA) 2.5 MG TABS tablet Take 5 mg by mouth daily.    Marland Kitchen warfarin (COUMADIN) 5 MG tablet See written post operative Coumadin instructions. (Patient taking differently: Take 2.5-5 mg by mouth every morning. Alternate taking  with 2.5mg  daily) 1 tablet 0   No current facility-administered medications for this visit.    Past Medical History  Diagnosis Date  . Diabetes mellitus   . DVT (deep venous thrombosis) (HCC)     multiple  . Neuropathy (HCC)   . Narcolepsy   . Cataplexy   . GERD (gastroesophageal reflux disease)   . Hiatal hernia   . Hypertension   . Shortness of breath     Past Surgical History  Procedure Laterality Date  . Abdominal hysterectomy    . Hemorroidectomy    . Carpal tunnel release    . Cholecystectomy    . Kidney surgery    . Total hip arthroplasty      bilateral  . Nephrostomy    . Carpectomy  12/26/2011    Procedure: CARPECTOMY;  Surgeon: Wyn Forster., MD;  Location: [facility];  Service: Orthopedics;  Laterality: Right;  PROXIMAL ROW CARPECTOMY RIGHT WRIST and neurectomy    Social History   Social History  . Marital Status: Widowed    Spouse Name: N/A  . Number of Children: N/A  . Years of Education: 11   Occupational History  .     Social History Main Topics  . Smoking status: Former Smoker    Quit date: 10/13/1988  . Smokeless tobacco: Never Used  . Alcohol Use: No  . Drug Use: No  . Sexual Activity: Not on file   Other Topics Concern  . Not on file   Social History Narrative     Filed Vitals:   11/21/14 1058  BP: 130/78  Pulse: 84  Height:  (1.676 m)  Weight: 242 lb 12.8 oz (110.133 kg)  SpO2: 90%    PHYSICAL EXAM General: NAD HEENT: Normal. Neck: No JVD, no thyromegaly. Lungs: Clear to auscultation bilaterally with normal respiratory effort. CV: Nondisplaced PMI.  Regular rate and rhythm, normal S1/S2, no S3/S4, 2/6 systolic murmur over RUSB. No  pretibial or periankle edema.     Abdomen: Soft, nontender, obese. Neurologic: Alert and oriented x 3.  Psych: Normal affect. Skin: Normal. Musculoskeletal: +left knee swelling with ecchymoses. Normal range of motion. Extremities: No clubbing or cyanosis.   ECG: Most recent ECG reviewed.      ASSESSMENT AND PLAN: 1. Chronic diastolic heart failure with valvular heart disease: Would treat medically at this time. Stable. No changes to diuretic regimen.  2. Valvular  heart disease: Echocardiogram results noted above. Medical therapy for now. Will monitor. Stable.  3. 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  4. Essential HTN: Controlled. No changes.  5. Left knee swelling: Normal range of motion. Encouraged to use ice packs to reduce swelling and pain.  6. CKD stage III: Recent BMET results noted above. Will need continued monitoring given diuretic therapy.  Dispo: f/u 6 months with APP.  Prentice Docker, M.D., F.A.C.C.

## 2014-11-21 NOTE — Patient Instructions (Addendum)
Your physician wants you to follow-up in: 6 months with Leda Gauze PA-CYou will receive a reminder letter in the mail two months in advance. If you don't receive a letter, please call our office to schedule the follow-up appointment.      Your physician recommends that you continue on your current medications as directed. Please refer to the Current Medication list given to you today.   Thank you for choosing [facility] !

## 2014-12-23 ENCOUNTER — Other Ambulatory Visit (HOSPITAL_COMMUNITY): Payer: Self-pay | Admitting: Family Medicine

## 2014-12-23 ENCOUNTER — Ambulatory Visit (HOSPITAL_COMMUNITY)
Admission: RE | Admit: 2014-12-23 | Discharge: 2014-12-23 | Disposition: A | Discharge status: Home / Self Care | Payer: Medicare Other | Source: Ambulatory Visit | Attending: Family Medicine | Admitting: Family Medicine

## 2014-12-23 DIAGNOSIS — M25512 Pain in left shoulder: Secondary | ICD-10-CM | POA: Diagnosis not present

## 2014-12-23 DIAGNOSIS — W19XXXA Unspecified fall, initial encounter: Secondary | ICD-10-CM | POA: Diagnosis not present

## 2015-02-10 ENCOUNTER — Other Ambulatory Visit (INDEPENDENT_AMBULATORY_CARE_PROVIDER_SITE_OTHER): Payer: Self-pay | Admitting: Internal Medicine

## 2015-02-22 ENCOUNTER — Emergency Department (HOSPITAL_COMMUNITY): Payer: Medicare Other

## 2015-02-22 ENCOUNTER — Encounter (HOSPITAL_COMMUNITY): Payer: Self-pay | Admitting: Emergency Medicine

## 2015-02-22 ENCOUNTER — Inpatient Hospital Stay (HOSPITAL_COMMUNITY)
Admission: EM | Admit: 2015-02-22 | Discharge: 2015-02-24 | DRG: 193 | Disposition: A | Discharge status: Home Health | Payer: Medicare Other | Attending: Family Medicine | Admitting: Family Medicine

## 2015-02-22 DIAGNOSIS — J189 Pneumonia, unspecified organism: Secondary | ICD-10-CM | POA: Diagnosis not present

## 2015-02-22 DIAGNOSIS — E872 Acidosis: Secondary | ICD-10-CM | POA: Diagnosis present

## 2015-02-22 DIAGNOSIS — I110 Hypertensive heart disease with heart failure: Secondary | ICD-10-CM | POA: Diagnosis present

## 2015-02-22 DIAGNOSIS — R0902 Hypoxemia: Secondary | ICD-10-CM | POA: Diagnosis present

## 2015-02-22 DIAGNOSIS — J9621 Acute and chronic respiratory failure with hypoxia: Secondary | ICD-10-CM | POA: Diagnosis present

## 2015-02-22 DIAGNOSIS — D509 Iron deficiency anemia, unspecified: Secondary | ICD-10-CM

## 2015-02-22 DIAGNOSIS — I350 Nonrheumatic aortic (valve) stenosis: Secondary | ICD-10-CM

## 2015-02-22 DIAGNOSIS — E1142 Type 2 diabetes mellitus with diabetic polyneuropathy: Secondary | ICD-10-CM | POA: Diagnosis present

## 2015-02-22 DIAGNOSIS — Z794 Long term (current) use of insulin: Secondary | ICD-10-CM | POA: Diagnosis not present

## 2015-02-22 DIAGNOSIS — Z96643 Presence of artificial hip joint, bilateral: Secondary | ICD-10-CM | POA: Diagnosis present

## 2015-02-22 DIAGNOSIS — K2100 Gastro-esophageal reflux disease with esophagitis, without bleeding: Secondary | ICD-10-CM

## 2015-02-22 DIAGNOSIS — I5032 Chronic diastolic (congestive) heart failure: Secondary | ICD-10-CM | POA: Diagnosis present

## 2015-02-22 DIAGNOSIS — Z7984 Long term (current) use of oral hypoglycemic drugs: Secondary | ICD-10-CM | POA: Diagnosis not present

## 2015-02-22 DIAGNOSIS — G47419 Narcolepsy without cataplexy: Secondary | ICD-10-CM

## 2015-02-22 DIAGNOSIS — Z7901 Long term (current) use of anticoagulants: Secondary | ICD-10-CM

## 2015-02-22 DIAGNOSIS — K219 Gastro-esophageal reflux disease without esophagitis: Secondary | ICD-10-CM | POA: Diagnosis present

## 2015-02-22 DIAGNOSIS — E785 Hyperlipidemia, unspecified: Secondary | ICD-10-CM | POA: Diagnosis present

## 2015-02-22 DIAGNOSIS — I825Z9 Chronic embolism and thrombosis of unspecified deep veins of unspecified distal lower extremity: Secondary | ICD-10-CM | POA: Diagnosis present

## 2015-02-22 DIAGNOSIS — Z86718 Personal history of other venous thrombosis and embolism: Secondary | ICD-10-CM | POA: Diagnosis not present

## 2015-02-22 DIAGNOSIS — J9601 Acute respiratory failure with hypoxia: Secondary | ICD-10-CM

## 2015-02-22 DIAGNOSIS — Z79899 Other long term (current) drug therapy: Secondary | ICD-10-CM

## 2015-02-22 DIAGNOSIS — R531 Weakness: Secondary | ICD-10-CM

## 2015-02-22 DIAGNOSIS — K760 Fatty (change of) liver, not elsewhere classified: Secondary | ICD-10-CM

## 2015-02-22 DIAGNOSIS — R05 Cough: Secondary | ICD-10-CM | POA: Diagnosis not present

## 2015-02-22 DIAGNOSIS — I10 Essential (primary) hypertension: Secondary | ICD-10-CM

## 2015-02-22 DIAGNOSIS — Z87891 Personal history of nicotine dependence: Secondary | ICD-10-CM | POA: Diagnosis not present

## 2015-02-22 DIAGNOSIS — I825Z1 Chronic embolism and thrombosis of unspecified deep veins of right distal lower extremity: Secondary | ICD-10-CM

## 2015-02-22 LAB — URINALYSIS, ROUTINE W REFLEX MICROSCOPIC
BILIRUBIN URINE: NEGATIVE
Glucose, UA: NEGATIVE mg/dL
Ketones, ur: NEGATIVE mg/dL
Nitrite: POSITIVE — AB
PH: 6 (ref 5.0–8.0)
Protein, ur: NEGATIVE mg/dL

## 2015-02-22 LAB — URINE MICROSCOPIC-ADD ON

## 2015-02-22 LAB — BRAIN NATRIURETIC PEPTIDE: B NATRIURETIC PEPTIDE 5: 122 pg/mL — AB (ref 0.0–100.0)

## 2015-02-22 LAB — PROTIME-INR
INR: 1.73 — ABNORMAL HIGH (ref 0.00–1.49)
Prothrombin Time: 20.3 seconds — ABNORMAL HIGH (ref 11.6–15.2)

## 2015-02-22 LAB — BASIC METABOLIC PANEL
ANION GAP: 8 (ref 5–15)
BUN: 23 mg/dL — ABNORMAL HIGH (ref 6–20)
CO2: 26 mmol/L (ref 22–32)
Calcium: 8.6 mg/dL — ABNORMAL LOW (ref 8.9–10.3)
Chloride: 101 mmol/L (ref 101–111)
Creatinine, Ser: 1.03 mg/dL — ABNORMAL HIGH (ref 0.44–1.00)
GFR calc Af Amer: >60 mL/min (ref >60)
GFR calc non Af Amer: 52 mL/min — ABNORMAL LOW (ref >60)
GLUCOSE: 224 mg/dL — AB (ref 65–99)
POTASSIUM: 3.9 mmol/L (ref 3.5–5.1)
Sodium: 135 mmol/L (ref 135–145)

## 2015-02-22 LAB — CBC
HEMATOCRIT: 38.4 % (ref 36.0–46.0)
HEMOGLOBIN: 13 g/dL (ref 12.0–15.0)
MCH: 28.6 pg (ref 26.0–34.0)
MCHC: 33.9 g/dL (ref 30.0–36.0)
MCV: 84.4 fL (ref 78.0–100.0)
Platelets: 226 10*3/uL (ref 150–400)
RBC: 4.55 MIL/uL (ref 3.87–5.11)
RDW: 15.4 % (ref 11.5–15.5)
WBC: 10.1 10*3/uL (ref 4.0–10.5)

## 2015-02-22 LAB — I-STAT CG4 LACTIC ACID, ED
LACTIC ACID, VENOUS: 2.06 mmol/L — AB (ref 0.5–2.0)
Lactic Acid, Venous: 1.07 mmol/L (ref 0.5–2.0)

## 2015-02-22 LAB — D-DIMER, QUANTITATIVE: D-Dimer, Quant: 0.62 ug/mL-FEU — ABNORMAL HIGH (ref 0.00–0.50)

## 2015-02-22 LAB — CBG MONITORING, ED: Glucose-Capillary: 187 mg/dL — ABNORMAL HIGH (ref 65–99)

## 2015-02-22 MED ORDER — CLOMIPRAMINE HCL 25 MG PO CAPS
25.0000 mg | ORAL_CAPSULE | Freq: Four times a day (QID) | ORAL | Status: DC
  Administered 2015-02-22 – 2015-02-24 (×8): 25 mg via ORAL
  Filled 2015-02-22 (×20): qty 1

## 2015-02-22 MED ORDER — ACETAMINOPHEN 325 MG PO TABS: 650.0000 mg | ORAL_TABLET | Freq: Four times a day (QID) | ORAL | Status: DC | PRN

## 2015-02-22 MED ORDER — ONDANSETRON HCL 4 MG/2ML IJ SOLN: 4.0000 mg | Freq: Four times a day (QID) | INTRAMUSCULAR | Status: DC | PRN

## 2015-02-22 MED ORDER — CLONAZEPAM 0.5 MG PO TABS
2.0000 mg | ORAL_TABLET | Freq: Every day | ORAL | Status: DC
  Administered 2015-02-22 – 2015-02-23 (×2): 2 mg via ORAL
  Filled 2015-02-22: qty 4

## 2015-02-22 MED ORDER — INSULIN GLARGINE 100 UNIT/ML ~~LOC~~ SOLN
36.0000 [IU] | Freq: Every day | SUBCUTANEOUS | Status: DC
  Administered 2015-02-22 – 2015-02-23 (×2): 36 [IU] via SUBCUTANEOUS
  Filled 2015-02-22 (×5): qty 0.36

## 2015-02-22 MED ORDER — PANTOPRAZOLE SODIUM 40 MG PO TBEC
40.0000 mg | DELAYED_RELEASE_TABLET | Freq: Two times a day (BID) | ORAL | Status: DC
  Administered 2015-02-22 – 2015-02-24 (×4): 40 mg via ORAL
  Filled 2015-02-22 (×4): qty 1

## 2015-02-22 MED ORDER — CELECOXIB 100 MG PO CAPS: 200.0000 mg | ORAL_CAPSULE | Freq: Every day | ORAL | Status: DC | PRN

## 2015-02-22 MED ORDER — FUROSEMIDE 40 MG PO TABS
40.0000 mg | ORAL_TABLET | Freq: Every day | ORAL | Status: DC
  Administered 2015-02-22 – 2015-02-24 (×3): 40 mg via ORAL
  Filled 2015-02-22 (×3): qty 1

## 2015-02-22 MED ORDER — DEXTROSE 5 % IV SOLN
500.0000 mg | INTRAVENOUS | Status: DC
  Administered 2015-02-22 – 2015-02-24 (×3): 500 mg via INTRAVENOUS
  Filled 2015-02-22 (×6): qty 500

## 2015-02-22 MED ORDER — POLYETHYLENE GLYCOL 3350 17 G PO PACK: 17.0000 g | PACK | Freq: Every day | ORAL | Status: DC | PRN

## 2015-02-22 MED ORDER — ACETAMINOPHEN 650 MG RE SUPP
650.0000 mg | Freq: Four times a day (QID) | RECTAL | Status: DC | PRN
Start: 2015-02-22 — End: 2015-02-24

## 2015-02-22 MED ORDER — QUETIAPINE FUMARATE 100 MG PO TABS
100.0000 mg | ORAL_TABLET | Freq: Every day | ORAL | Status: DC
  Administered 2015-02-22 – 2015-02-23 (×2): 100 mg via ORAL
  Filled 2015-02-22 (×2): qty 1

## 2015-02-22 MED ORDER — DULOXETINE HCL 60 MG PO CPEP
60.0000 mg | ORAL_CAPSULE | Freq: Every day | ORAL | Status: DC
  Administered 2015-02-22 – 2015-02-24 (×3): 60 mg via ORAL
  Filled 2015-02-22 (×3): qty 1

## 2015-02-22 MED ORDER — ONDANSETRON HCL 4 MG PO TABS: 4.0000 mg | ORAL_TABLET | Freq: Four times a day (QID) | ORAL | Status: DC | PRN

## 2015-02-22 MED ORDER — LEVOFLOXACIN IN D5W 750 MG/150ML IV SOLN
750.0000 mg | INTRAVENOUS | Status: DC
  Administered 2015-02-23: 750 mg via INTRAVENOUS
  Filled 2015-02-22 (×2): qty 150

## 2015-02-22 MED ORDER — SODIUM CHLORIDE 0.9 % IV BOLUS (SEPSIS)
1000.0000 mL | Freq: Once | INTRAVENOUS | Status: AC
  Administered 2015-02-22: 1000 mL via INTRAVENOUS

## 2015-02-22 MED ORDER — DEXTROSE 5 % IV SOLN
1.0000 g | Freq: Once | INTRAVENOUS | Status: AC
  Administered 2015-02-22: 1 g via INTRAVENOUS
  Filled 2015-02-22: qty 10

## 2015-02-22 MED ORDER — MIRTAZAPINE 15 MG PO TABS
15.0000 mg | ORAL_TABLET | Freq: Every day | ORAL | Status: DC
  Administered 2015-02-22 – 2015-02-23 (×2): 15 mg via ORAL
  Filled 2015-02-22 (×2): qty 1

## 2015-02-22 MED ORDER — POTASSIUM CHLORIDE CRYS ER 10 MEQ PO TBCR
10.0000 meq | EXTENDED_RELEASE_TABLET | Freq: Every day | ORAL | Status: DC
  Administered 2015-02-23 – 2015-02-24 (×2): 10 meq via ORAL
  Filled 2015-02-22 (×5): qty 1

## 2015-02-22 NOTE — Progress Notes (Signed)
Inpatient Diabetes Program Recommendations  AACE/ADA: New Consensus Statement on Inpatient Glycemic Control (2015)  Target Ranges:  Prepandial:   less than 140 mg/dL      Peak postprandial:   less than 180 mg/dL (1-2 hours)      Critically ill patients:  140 - 180 mg/dL  Results for Rebecca [name], Rebecca [name] (MRN [id number]) as of 02/22/2015 13:31  Ref. Range 02/22/2015 08:50  Glucose Latest Ref Range: 65-99 mg/dL 865 (H)   Review of Glycemic Control  Diabetes history: DM2 Outpatient Diabetes medications: Lantus 36 units QHS, Metformin 500 mg BID, Onglyza 5 mg daily Current orders for Inpatient glycemic control: None  Inpatient Diabetes Program Recommendations: Correction (SSI): Please consider ordering CBGs with Novolog correction scale ACHS. HgbA1C: Please consider ordering an A1C to evaluate glycemic control over the past 2-3 months.  Thanks, Orlando Penner, RN, MSN, CDE Diabetes Coordinator Inpatient Diabetes Program [phone] (Team Pager from 8am to 5pm) [phone] ([facility] office) [phone] [facility] office) [phone] [facility] office)

## 2015-02-22 NOTE — ED Notes (Signed)
Pt O2 saturation 88% at rest on room air. Pt placed on O2 at 2L [employer].

## 2015-02-22 NOTE — ED Notes (Signed)
Pt ambulated to restroom with walker and one assist. O2 saturation 90%-94% on room air. Pt weak and increased dyspnea with walking. O2 saturation 88%- 91% and pt tachepnic upon returning to stretcher. Pt placed on O2 at 2L, VSS.

## 2015-02-22 NOTE — ED Notes (Signed)
Pt reports generalized weakness x3 days and productive cough x3 days, yellow/green in nature.  Pt denies n/v/d/chills/fever.  Pt alert and oriented. Vitals WNL.  91p, 96%Ra, 150/77, hx htn .

## 2015-02-22 NOTE — ED Provider Notes (Signed)
CSN: [id number]     Arrival date & time 02/22/15  4098 History  By signing my name below, I, Elon Spanner, attest that this documentation has been prepared under the direction and in the presence of Marily Memos, MD. Electronically Signed: Elon Spanner, ED Scribe. 02/22/2015. 8:33 AM.   Chief Complaint  Patient presents with  . Weakness   The history is provided by the patient. No language interpreter was used.   HPI Comments: Rebecca [name] is a 75 y.o. female with hx of DVT (coumadin), CHF (20 mg Lasix daily) who presents to the Emergency Department complaining of constant, gradually worsening, generalized weakness onset 3 days ago.  The complaint is worsened with exertion.   Associated symptoms include a cough productive of yellow/green sputum and generalized body aches.  Patient reports prior admission for CHF and reports this episode feels similar.  Patient does not use at-home 02.  She denies fever, chills, SOB, n/v/d, abdominal pain, back pain, new arthralgias, leg swelling, rash, weight gain, CP, dysuria, recent travel.  Patient does not use tobacco, alcohol, or illicit drugs.   Past Medical History  Diagnosis Date  . Diabetes mellitus   . DVT (deep venous thrombosis) (HCC)     multiple  . Neuropathy (HCC)   . Narcolepsy   . Cataplexy   . GERD (gastroesophageal reflux disease)   . Hiatal hernia   . Hypertension   . Shortness of breath   . CHF (congestive heart failure) [facility])    Past Surgical History  Procedure Laterality Date  . Abdominal hysterectomy    . Hemorroidectomy    . Carpal tunnel release    . Cholecystectomy    . Kidney surgery    . Total hip arthroplasty      bilateral  . Nephrostomy    . Carpectomy  12/26/2011    Procedure: CARPECTOMY;  Surgeon: Wyn Forster., MD;  Location: [facility];  Service: Orthopedics;  Laterality: Right;  PROXIMAL ROW CARPECTOMY RIGHT WRIST and neurectomy   Family History  Problem Relation Age of Onset  .  Heart disease Father   . Diabetes    . Stroke Mother   . Scleroderma Brother    Social History  Substance Use Topics  . Smoking status: Former Smoker    Quit date: 10/13/1988  . Smokeless tobacco: Never Used  . Alcohol Use: No   OB History    No data available     Review of Systems  Constitutional: Negative for fever and chills.  Respiratory: Positive for cough. Negative for shortness of breath.   Cardiovascular: Negative for chest pain and leg swelling.  Gastrointestinal: Negative for nausea, vomiting, abdominal pain and diarrhea.  Genitourinary: Negative for dysuria and frequency.  Musculoskeletal: Positive for myalgias. Negative for back pain and arthralgias.  Skin: Negative for rash.      Allergies  Codeine; Compazine; Other; Penicillins; Morphine and related; and Demerol  Home Medications   Prior to Admission medications   Medication Sig Start Date End Date Taking? Authorizing Provider  Ascorbic Acid (VITAMIN C WITH ROSE HIPS) 500 MG tablet Take 500 mg by mouth daily.   Yes Historical Provider, MD  Biotin (BIOTIN MAXIMUM STRENGTH) 10 MG TABS Take 1 tablet by mouth daily.   Yes Historical Provider, MD  celecoxib (CELEBREX) 200 MG capsule Take 200 mg by mouth daily as needed. For arthritis   Yes Historical Provider, MD  Cholecalciferol (VITAMIN D3) 3000 UNITS TABS Take 1 capsule by mouth  daily.   Yes Historical Provider, MD  clomiPRAMINE (ANAFRANIL) 25 MG capsule Take 25 mg by mouth 4 (four) times daily.    Yes Historical Provider, MD  clonazePAM (KLONOPIN) 2 MG tablet Take 2 mg by mouth at bedtime.   Yes Historical Provider, MD  Coenzyme Q10 (CO Q10) 200 MG CAPS Take 1 capsule by mouth daily.   Yes Historical Provider, MD  diltiazem (CARDIZEM CD) 180 MG 24 hr capsule Take 2 capsules (360 mg total) by mouth daily. 10/30/10  Yes [name], MD  DULoxetine (CYMBALTA) 60 MG capsule Take 60 mg by mouth daily.     Yes Historical Provider, MD  Ferrous Fumarate  (FERROCITE) 324 (106 FE) MG TABS Take 1 capsule by mouth 3 (three) times a week.   Yes Historical Provider, MD  furosemide (LASIX) 40 MG tablet Take 1 tablet by mouth daily. 12/01/14  Yes Historical Provider, MD  Hyaluronic Acid-Vitamin C (HYALURONIC ACID PO) Take 100 mg by mouth daily.   Yes Historical Provider, MD  insulin glargine (LANTUS) 100 UNIT/ML injection Inject 36 Units into the skin at bedtime.    Yes Historical Provider, MD  Magnesium 300 MG CAPS Take 600 mg by mouth daily.   Yes Historical Provider, MD  metFORMIN (GLUCOPHAGE) 500 MG tablet Take 500 mg by mouth 2 (two) times daily with a meal.     Yes Historical Provider, MD  mirtazapine (REMERON) 15 MG tablet Take 15 mg by mouth at bedtime.     Yes Historical Provider, MD  omeprazole (PRILOSEC) 20 MG capsule Take 1 capsule (20 mg total) by mouth daily. 10/17/14  Yes Len Blalock, NP  omeprazole (PRILOSEC) 20 MG capsule TAKE 1 CAPSULE BY MOUTH TWICE DAILY 02/13/15  Yes Len Blalock, NP  oxyCODONE (OXY IR/ROXICODONE) 5 MG immediate release tablet Take 5 mg by mouth 2 (two) times daily as needed. For pain 08/26/14  Yes Historical Provider, MD  potassium chloride (K-DUR) 10 MEQ tablet Take 1 tablet (10 mEq total) by mouth daily. 09/21/14  Yes Dyann Kief, PA-C  Probiotic Product (PROBIOTIC ADVANCED PO) Take 1 capsule by mouth daily.   Yes Historical Provider, MD  QUEtiapine (SEROQUEL) 100 MG tablet Take 100 mg by mouth at bedtime.   Yes Historical Provider, MD  RA KRILL OIL 500 MG CAPS Take 1 capsule by mouth daily.   Yes Historical Provider, MD  saxagliptin HCl (ONGLYZA) 2.5 MG TABS tablet Take 5 mg by mouth daily.   Yes Historical Provider, MD  warfarin (COUMADIN) 5 MG tablet See written post operative Coumadin instructions. Patient taking differently: Take 2.5-5 mg by mouth every morning. Alternate taking  with 2.5mg  daily 12/26/11  Yes Josephine Igo, MD   BP 136/70 mmHg  Pulse 86  Temp(Src) 98.2 F (36.8 C) (Oral)  Resp 22   Ht  (1.702 m)  Wt 240 lb (108.863 kg)  BMI 37.58 kg/m2  SpO2 91% Physical Exam  Constitutional: She is oriented to person, place, and time. She appears well-developed and well-nourished. No distress.  Normal mental status.    HENT:  Head: Normocephalic and atraumatic.  Eyes: Conjunctivae and EOM are normal.  Neck: Neck supple. No tracheal deviation present.  Cardiovascular: Normal rate, regular rhythm and normal heart sounds.   Pulmonary/Chest: Effort normal and breath sounds normal. No respiratory distress.  Crackles in the right upper lobe.  Not tachypnic.    Abdominal: Soft. Bowel sounds are normal. There is no tenderness.  Musculoskeletal: Normal range of motion.  Brawny edema of LLE.  No pain with dorsiflexion and plantarflexion.  No calf tenderness no pain and swelling in lower extremities.    Neurological: She is alert and oriented to person, place, and time. No cranial nerve deficit.  Skin: Skin is warm and dry. No rash noted.  Psychiatric: She has a normal mood and affect. Her behavior is normal.  Nursing note and vitals reviewed.   ED Course  Procedures (including critical care time)  DIAGNOSTIC STUDIES: Oxygen Saturation is 94% on RA, adequate by my interpretation.    COORDINATION OF CARE:  8:40 AM Discussed suspicion of pneumonia.  Will order CXR, antibiotics, and reevaluate.  Patient acknowledges and agrees with plan.    Labs Review Labs Reviewed  BASIC METABOLIC PANEL - Abnormal; Notable for the following:    Glucose, Bld 224 (*)    BUN 23 (*)    Creatinine, Ser 1.03 (*)    Calcium 8.6 (*)    GFR calc non Af Amer 52 (*)    All other components within normal limits  URINALYSIS, ROUTINE W REFLEX MICROSCOPIC (NOT AT [facility]) - Abnormal; Notable for the following:    Specific Gravity, Urine <1.005 (*)    Hgb urine dipstick SMALL (*)    Nitrite POSITIVE (*)    Leukocytes, UA LARGE (*)    All other components within normal limits  PROTIME-INR - Abnormal;  Notable for the following:    Prothrombin Time 20.3 (*)    INR 1.73 (*)    All other components within normal limits  BRAIN NATRIURETIC PEPTIDE - Abnormal; Notable for the following:    B Natriuretic Peptide 122.0 (*)    All other components within normal limits  D-DIMER, QUANTITATIVE (NOT AT [facility]) - Abnormal; Notable for the following:    D-Dimer, Quant 0.62 (*)    All other components within normal limits  URINE MICROSCOPIC-ADD ON - Abnormal; Notable for the following:    Squamous Epithelial / LPF 0-5 (*)    Bacteria, UA MANY (*)    All other components within normal limits  BASIC METABOLIC PANEL - Abnormal; Notable for the following:    Glucose, Bld 144 (*)    Calcium 8.7 (*)    All other components within normal limits  CBG MONITORING, ED - Abnormal; Notable for the following:    Glucose-Capillary 187 (*)    All other components within normal limits  I-STAT CG4 LACTIC ACID, ED - Abnormal; Notable for the following:    Lactic Acid, Venous 2.06 (*)    All other components within normal limits  CULTURE, BLOOD (ROUTINE X 2)  CULTURE, BLOOD (ROUTINE X 2)  URINE CULTURE  CULTURE, EXPECTORATED SPUTUM-ASSESSMENT  GRAM STAIN  CBC  CBC  STREP PNEUMONIAE URINARY ANTIGEN  I-STAT CG4 LACTIC ACID, ED    Imaging Review Dg Chest 2 View  02/22/2015  CLINICAL DATA:  Productive cough. EXAM: CHEST  2 VIEW COMPARISON:  September 05, 2014. FINDINGS: The heart size and mediastinal contours are within normal limits. Both lungs are clear. No pneumothorax or pleural effusion is noted. The visualized skeletal structures are unremarkable. IMPRESSION: No active cardiopulmonary disease. Electronically Signed   By: Lupita Raider, M.D.   On: 02/22/2015 09:52   I have personally reviewed and evaluated these images and lab results as part of my medical decision-making.   EKG Interpretation   Date/Time:  Wednesday February 22 2015 08:25:36 EST Ventricular Rate:  88 PR Interval:  185 QRS Duration:  127 QT Interval:  415 QTC Calculation: 502 R Axis:   -108 Text Interpretation:  Sinus rhythm Probable left atrial enlargement Right  bundle branch block No significant change since last tracing September 06, 2014 Confirmed by [facility] MD, Barbara Cower [id number]) on 02/22/2015 8:31:33 AM      MDM   Final diagnoses:  Weakness  Hypoxia  Community acquired pneumonia    75 year old female here with a few days of progressively worsening weakness and also productive cough. No fevers or other symptoms. Crackles in right upper lobe, hypoxia and tachypnea. Concern for possible pneumonia will treated properly. Patient gets significantly tachypneic and hypoxic on ambulation so [facility]'ll [facility] for [facility] observation and management.  I personally performed the services described in this documentation, which was scribed in my presence. The recorded information has been reviewed and is accurate.  Marily Memos, MD 02/23/15 [phone]

## 2015-02-22 NOTE — H&P (Signed)
History and Physical  TAMETRIA AHO GNF:[id number] DOB: 1940-06-25 DOA: 02/22/2015  Referring physician: Marily Memos, MD. PCP: Isabella Stalling, MD   Chief Complaint: Weakness  HPI:  30 yof PMHx of DM type 2, DVT, GERD, and HTN presents with complaints of weakness and a productive cough. While in the ED, patient was hypoxic to 88% on RA. Labs revealed a BUN of 23, Creatinine of 1.03, BNP of 122, Lactic acid of 2.06, D-dimer of 0.62 and an INR of 1.73. CXR was unremarkable. Initial clinical impression was pneumonia.  Patient reports generalized weakness for the last several days, no shortness of breath but has had a productive cough and generalized body aches. Does not wear oxygen at home. No fever or other systemic symptoms. No specific aggravating or alleviating factors.  In the emergency department VSS, afebrile, not hypoxic on [place] Pertinent labs:  BUN of 23, Creatinine of 1.03, BNP of 122, Lactic acid of 2.06, D-dimer of 0.62 and an INR of 1.73. EKG: Independently reviewed. SR RBBB, NSCSLT 09/06/2014 Imaging: independently reviewed. No acute disease  Review of Systems:  Positive for generalized weakness, generalized body aches, productive cough Negative for fever, visual changes, sore throat, rash,  chest pain, SOB, dysuria, bleeding, n/v/abdominal pain.  Past Medical History  Diagnosis Date  . Diabetes mellitus   . DVT (deep venous thrombosis) (HCC)     multiple  . Neuropathy (HCC)   . Narcolepsy   . Cataplexy   . GERD (gastroesophageal reflux disease)   . Hiatal hernia   . Hypertension   . Shortness of breath   . CHF (congestive heart failure) [facility])     Past Surgical History  Procedure Laterality Date  . Abdominal hysterectomy    . Hemorroidectomy    . Carpal tunnel release    . Cholecystectomy    . Kidney surgery    . Total hip arthroplasty      bilateral  . Nephrostomy    . Carpectomy  12/26/2011    Procedure: CARPECTOMY;  Surgeon: Wyn Forster., MD;   Location: [facility];  Service: Orthopedics;  Laterality: Right;  PROXIMAL ROW CARPECTOMY RIGHT WRIST and neurectomy    Social History:  reports that she quit smoking about 26 years ago. She has never used smokeless tobacco. She reports that she does not drink alcohol or use illicit drugs. lives with their son [facility]  Allergies  Allergen Reactions  . Codeine Anxiety  . Compazine Anaphylaxis  . Other Anaphylaxis, Rash and Other (See Comments)    Uncoded Allergy. Allergen: INOVAR Uncoded Allergy. Allergen: ALL ADHESIVES Uncoded Allergy. Allergen: SILK SUTURE Uncoded Allergy. Allergen: merthiolate Thermasol  . Penicillins Shortness Of Breath and Rash    Has patient had a PCN reaction causing immediate rash, facial/tongue/throat swelling, SOB or lightheadedness with hypotension: Yes Has patient had a PCN reaction causing severe rash involving mucus membranes or skin necrosis: No Has patient had a PCN reaction that required hospitalization No Has patient had a PCN reaction occurring within the last 10 years: Yes If all of the above answers are "NO", then may proceed with Cephalosporin use.   Marland Kitchen Morphine And Related Other (See Comments)    Patient states that it makes her lose her state of mind.   . Demerol [Meperidine] Rash    Family History  Problem Relation Age of Onset  . Heart disease    . Diabetes       Prior to Admission medications   Medication Sig Start  Date End Date Taking? Authorizing Provider  Ascorbic Acid (VITAMIN C WITH ROSE HIPS) 500 MG tablet Take 500 mg by mouth daily.   Yes Historical Provider, MD  Biotin (BIOTIN MAXIMUM STRENGTH) 10 MG TABS Take 1 tablet by mouth daily.   Yes Historical Provider, MD  celecoxib (CELEBREX) 200 MG capsule Take 200 mg by mouth daily as needed. For arthritis   Yes Historical Provider, MD  Cholecalciferol (VITAMIN D3) 3000 UNITS TABS Take 1 capsule by mouth daily.   Yes Historical Provider, MD  clomiPRAMINE  (ANAFRANIL) 25 MG capsule Take 25 mg by mouth 4 (four) times daily.    Yes Historical Provider, MD  clonazePAM (KLONOPIN) 2 MG tablet Take 2 mg by mouth at bedtime.   Yes Historical Provider, MD  Coenzyme Q10 (CO Q10) 200 MG CAPS Take 1 capsule by mouth daily.   Yes Historical Provider, MD  diltiazem (CARDIZEM CD) 180 MG 24 hr capsule Take 2 capsules (360 mg total) by mouth daily. 10/30/10  Yes [name], MD  DULoxetine (CYMBALTA) 60 MG capsule Take 60 mg by mouth daily.     Yes Historical Provider, MD  Ferrous Fumarate (FERROCITE) 324 (106 FE) MG TABS Take 1 capsule by mouth 3 (three) times a week.   Yes Historical Provider, MD  furosemide (LASIX) 40 MG tablet Take 1 tablet by mouth daily. 12/01/14  Yes Historical Provider, MD  Hyaluronic Acid-Vitamin C (HYALURONIC ACID PO) Take 100 mg by mouth daily.   Yes Historical Provider, MD  insulin glargine (LANTUS) 100 UNIT/ML injection Inject 36 Units into the skin at bedtime.    Yes Historical Provider, MD  Magnesium 300 MG CAPS Take 600 mg by mouth daily.   Yes Historical Provider, MD  metFORMIN (GLUCOPHAGE) 500 MG tablet Take 500 mg by mouth 2 (two) times daily with a meal.     Yes Historical Provider, MD  mirtazapine (REMERON) 15 MG tablet Take 15 mg by mouth at bedtime.     Yes Historical Provider, MD  omeprazole (PRILOSEC) 20 MG capsule Take 1 capsule (20 mg total) by mouth daily. 10/17/14  Yes Len Blalock, NP  omeprazole (PRILOSEC) 20 MG capsule TAKE 1 CAPSULE BY MOUTH TWICE DAILY 02/13/15  Yes Len Blalock, NP  oxyCODONE (OXY IR/ROXICODONE) 5 MG immediate release tablet Take 5 mg by mouth 2 (two) times daily as needed. For pain 08/26/14  Yes Historical Provider, MD  potassium chloride (K-DUR) 10 MEQ tablet Take 1 tablet (10 mEq total) by mouth daily. 09/21/14  Yes Dyann Kief, PA-C  Probiotic Product (PROBIOTIC ADVANCED PO) Take 1 capsule by mouth daily.   Yes Historical Provider, MD  QUEtiapine (SEROQUEL) 100 MG tablet Take 100 mg by  mouth at bedtime.   Yes Historical Provider, MD  RA KRILL OIL 500 MG CAPS Take 1 capsule by mouth daily.   Yes Historical Provider, MD  saxagliptin HCl (ONGLYZA) 2.5 MG TABS tablet Take 5 mg by mouth daily.   Yes Historical Provider, MD  warfarin (COUMADIN) 5 MG tablet See written post operative Coumadin instructions. Patient taking differently: Take 2.5-5 mg by mouth every morning. Alternate taking 5mg  with 2.5mg  daily 12/26/11  Yes Josephine Igo, MD   Physical Exam: Filed Vitals:   02/22/15 0824 02/22/15 1030  BP: 109/57 90/77  Pulse: 85 79  Temp: 98.1 F (36.7 C)   TempSrc: Oral   Resp: 22   Height: 5\' 7"  (1.702 m)   Weight: 108.863 kg (240 lb)   SpO2: 94%  91%    VSS, afebrile. Not hypoxic on 2L [place].  General:  Appears calm and comfortable Eyes: PERRL, normal lids, irises  ENT: grossly normal hearing, lips & tongue Neck: no LAD, masses or thyromegaly Cardiovascular: RRR, no m/r/g. No LE edema. Respiratory: CTA bilaterally, no w/r/r. Normal respiratory effort. Abdomen: soft, ntnd Skin: no rash or induration  Musculoskeletal: grossly normal tone BUE/BLE Psychiatric: grossly normal mood and affect, speech fluent and appropriate Neurologic: grossly non-focal.  Wt Readings from Last 3 Encounters:  02/22/15 108.863 kg (240 lb)  11/21/14 110.133 kg (242 lb 12.8 oz)  09/21/14 107.956 kg (238 lb)    Labs on Admission:  Basic Metabolic Panel:  Recent Labs Lab 02/22/15 0850  NA 135  K 3.9  CL 101  CO2 26  GLUCOSE 224*  BUN 23*  CREATININE 1.03*  CALCIUM 8.6*     CBC:  Recent Labs Lab 02/22/15 0850  WBC 10.1  HGB 13.0  HCT 38.4  MCV 84.4  PLT 226     CBG:  Recent Labs Lab 02/22/15 0859  GLUCAP 187*     Radiological Exams on Admission: Dg Chest 2 View  02/22/2015  CLINICAL DATA:  Productive cough. EXAM: CHEST  2 VIEW COMPARISON:  September 05, 2014. FINDINGS: The heart size and mediastinal contours are within normal limits. Both lungs are clear. No  pneumothorax or pleural effusion is noted. The visualized skeletal structures are unremarkable. IMPRESSION: No active cardiopulmonary disease. Electronically Signed   By: Lupita Raider, M.D.   On: 02/22/2015 09:52      Principal Problem:   Acute respiratory failure with hypoxia (HCC) Active Problems:   Lower leg DVT (deep venous thromboembolism), chronic (HCC)   Chronic anticoagulation-Coumadin   CAP (community acquired pneumonia)   Assessment/Plan 1. Acute hypoxic respiratory failure. Currently requiring 2L of O2 via [place].  D-dimer WNL when adjusted for age.  2. Suspected CAP with DOE and productive cough; despite negative chest x-ray clinically this appears to be pneumonia. 3. Lactic acidosis, mild. Repeat lactic acid within normal limits. Hemodynamically stable, normal WBC. No evidence of sepsis.  4. Chronic diastolic heart failure. Appears compensated at this time.  5. DM, stable. 6. PMH of multiple DVTs. On coumadin. INR 1.73   Admit to medical floor. Continue IV fluids and  IV abx.   Wean O2 as tolerated.   Warfarin per pharmacy  Code Status: Full  DVT prophylaxis: Coumadin Family Communication: Discussed with granddaughter at bedside Disposition Plan/Anticipated LOS: Admit to medical floor, anticipate  2-3 day stay.  Time spent: 55 minutes  Brendia Sacks, MD  [facility] Pager [phone] 02/22/2015, 10:58 AM    By signing my name below, I, Burnett Harry attest that this documentation has been prepared under the direction and in the presence of Brendia Sacks, MD Electronically signed: Burnett Harry, Scribe.  02/22/2015   I personally performed the services described in this documentation. All medical record entries made by the scribe were at my direction. I have reviewed the chart and agree that the record reflects my personal performance and is accurate and complete. Brendia Sacks, MD

## 2015-02-23 LAB — STREP PNEUMONIAE URINARY ANTIGEN: Strep Pneumo Urinary Antigen: NEGATIVE

## 2015-02-23 LAB — CBC
HCT: 39.2 % (ref 36.0–46.0)
Hemoglobin: 13 g/dL (ref 12.0–15.0)
MCH: 28.1 pg (ref 26.0–34.0)
MCHC: 33.2 g/dL (ref 30.0–36.0)
MCV: 84.8 fL (ref 78.0–100.0)
Platelets: 227 10*3/uL (ref 150–400)
RBC: 4.62 MIL/uL (ref 3.87–5.11)
RDW: 15.2 % (ref 11.5–15.5)
WBC: 9.5 10*3/uL (ref 4.0–10.5)

## 2015-02-23 LAB — EXPECTORATED SPUTUM ASSESSMENT W REFEX TO RESP CULTURE

## 2015-02-23 LAB — BASIC METABOLIC PANEL
Anion gap: 9 (ref 5–15)
BUN: 15 mg/dL (ref 6–20)
CALCIUM: 8.7 mg/dL — AB (ref 8.9–10.3)
CHLORIDE: 102 mmol/L (ref 101–111)
CO2: 27 mmol/L (ref 22–32)
Creatinine, Ser: 0.89 mg/dL (ref 0.44–1.00)
GLUCOSE: 144 mg/dL — AB (ref 65–99)
Potassium: 3.7 mmol/L (ref 3.5–5.1)
Sodium: 138 mmol/L (ref 135–145)

## 2015-02-23 LAB — URINE CULTURE

## 2015-02-23 LAB — PROTIME-INR
INR: 1.68 — AB (ref 0.00–1.49)
PROTHROMBIN TIME: 19.8 s — AB (ref 11.6–15.2)

## 2015-02-23 MED ORDER — WARFARIN SODIUM 5 MG PO TABS
5.0000 mg | ORAL_TABLET | Freq: Once | ORAL | Status: AC
Start: 2015-02-23 — End: 2015-02-23
  Administered 2015-02-23: 5 mg via ORAL
  Filled 2015-02-23: qty 1

## 2015-02-23 MED ORDER — ALBUTEROL SULFATE (2.5 MG/3ML) 0.083% IN NEBU
2.5000 mg | INHALATION_SOLUTION | Freq: Four times a day (QID) | RESPIRATORY_TRACT | Status: DC | PRN
Start: 2015-02-23 — End: 2015-02-24

## 2015-02-23 MED ORDER — WARFARIN - PHARMACIST DOSING INPATIENT: Status: DC

## 2015-02-23 NOTE — Progress Notes (Signed)
*Patient currently on Levaquin empirically has acute on chronic dyspnea will put overnight pulse oximetry to see if she desaturates without oxygen tonight we'll add Xopenex 4 times a day nebulizer and monitor PT/INR as the effects of Levaquin will interfere with tis on a daily basis SHARY FENG [id number] DOB: 05/02/1940 DOA: 02/22/2015 PCP: Isabella Stalling, MD             Physical Exam: Blood pressure 136/70, pulse 86, temperature 98.2 F (36.8 C), temperature source Oral, resp. rate 22, height 5\' 7"  (1.702 m), weight 240 lb (108.863 kg), SpO2 91 %. lungs show prolonged expiratory phase scattered rhonchi no rales no wheezes appreciable some diminished breath sounds in the bases heart regular rhythm no S3-S4 no heaves thrills rubs abdomen obese soft nontender detectable organomegaly no guarding or rebound   Investigations:  Recent Results (from the past 240 hour(s))  Blood Culture (routine x 2)     Status: None (Preliminary result)   Collection Time: 02/22/15  8:50 AM  Result Value Ref Range Status   Specimen Description BLOOD LEFT HAND DRAWN BY RN  Final   Special Requests BOTTLES DRAWN AEROBIC AND ANAEROBIC 5 CC EACH  Final   Culture NO GROWTH 1 DAY  Final   Report Status PENDING  Incomplete  Blood Culture (routine x 2)     Status: None (Preliminary result)   Collection Time: 02/22/15  9:00 AM  Result Value Ref Range Status   Specimen Description BLOOD RIGHT ARM  Final   Special Requests BOTTLES DRAWN AEROBIC AND ANAEROBIC 10CC EACH  Final   Culture NO GROWTH 1 DAY  Final   Report Status PENDING  Incomplete  Urine culture     Status: None   Collection Time: 02/22/15 12:00 PM  Result Value Ref Range Status   Specimen Description URINE, CLEAN CATCH  Final   Special Requests NONE  Final   Culture   Final    MULTIPLE SPECIES PRESENT, SUGGEST RECOLLECTION Performed at [facility]    Report Status 02/23/2015 FINAL  Final     Basic Metabolic Panel:  Recent  Labs  02/22/15 0850 02/23/15 0630  NA 135 138  K 3.9 3.7  CL 101 102  CO2 26 27  GLUCOSE 224* 144*  BUN 23* 15  CREATININE 1.03* 0.89  CALCIUM 8.6* 8.7*   Liver Function Tests: No results for input(s): AST, ALT, ALKPHOS, BILITOT, PROT, ALBUMIN in the last 72 hours.   CBC:  Recent Labs  02/22/15 0850 02/23/15 0630  WBC 10.1 9.5  HGB 13.0 13.0  HCT 38.4 39.2  MCV 84.4 84.8  PLT 226 227    Dg Chest 2 View  02/22/2015  CLINICAL DATA:  Productive cough. EXAM: CHEST  2 VIEW COMPARISON:  September 05, 2014. FINDINGS: The heart size and mediastinal contours are within normal limits. Both lungs are clear. No pneumothorax or pleural effusion is noted. The visualized skeletal structures are unremarkable. IMPRESSION: No active cardiopulmonary disease. Electronically Signed   By: Lupita Raider, M.D.   On: 02/22/2015 09:52      Medications:   Impression:  Principal Problem:   Acute respiratory failure with hypoxia (HCC) Active Problems:   Lower leg DVT (deep venous thromboembolism), chronic (HCC)   Chronic anticoagulation-Coumadin   CAP (community acquired pneumonia)     Plan: Continue pulse oximetry for sleep without nasal O2 to see if she desaturates and we can document this possible sleep apnea will and Xopenex nebulizer 4 times a  day  Consultants:    Procedures   Antibiotics: Levaquin                  Code Status: Full  Family Communication:    Disposition Plan   Time spent: 30   LOS: 1 day   Starlyn Droge M   02/23/2015, 1:20 PM

## 2015-02-23 NOTE — Progress Notes (Signed)
ANTICOAGULATION CONSULT NOTE - Initial Consult  Pharmacy Consult for Coumadin (chronic Rx PTA) Indication: H/O multiple DVT's  Allergies  Allergen Reactions  . Codeine Anxiety  . Compazine Anaphylaxis  . Other Anaphylaxis, Rash and Other (See Comments)    Uncoded Allergy. Allergen: INOVAR Uncoded Allergy. Allergen: ALL ADHESIVES Uncoded Allergy. Allergen: SILK SUTURE Uncoded Allergy. Allergen: merthiolate Thermasol  . Penicillins Shortness Of Breath and Rash    Has patient had a PCN reaction causing immediate rash, facial/tongue/throat swelling, SOB or lightheadedness with hypotension: Yes Has patient had a PCN reaction causing severe rash involving mucus membranes or skin necrosis: No Has patient had a PCN reaction that required hospitalization No Has patient had a PCN reaction occurring within the last 10 years: Yes If all of the above answers are "NO", then may proceed with Cephalosporin use.   Marland Kitchen Morphine And Related Other (See Comments)    Patient states that it makes her lose her state of mind.   . Demerol [Meperidine] Rash    Patient Measurements: Height:  (170.2 cm) Weight: 240 lb (108.863 kg) IBW/kg (Calculated) : 61.6  Vital Signs: Temp: 98.2 F (36.8 C) (01/19 0700) Temp Source: Oral (01/19 0700) BP: 136/70 mmHg (01/19 0700) Pulse Rate: 86 (01/19 0700)  Labs:  Recent Labs  02/22/15 0850 02/22/15 0921 02/23/15 0630 02/23/15 1317  HGB 13.0  --  13.0  --   HCT 38.4  --  39.2  --   PLT 226  --  227  --   LABPROT  --  20.3*  --  19.8*  INR  --  1.73*  --  1.68*  CREATININE 1.03*  --  0.89  --     Estimated Creatinine Clearance: 69.4 mL/min (by C-G formula based on Cr of 0.89).   Medical History: Past Medical History  Diagnosis Date  . Diabetes mellitus   . DVT (deep venous thrombosis) (HCC)     multiple  . Neuropathy (HCC)   . Narcolepsy   . Cataplexy   . GERD (gastroesophageal reflux disease)   . Hiatal hernia   . Hypertension   .  Shortness of breath   . CHF (congestive heart failure) (HCC)    Medications Prior to Admission  Medication Sig Dispense Refill  . Ascorbic Acid (VITAMIN C WITH ROSE HIPS) 500 MG tablet Take 500 mg by mouth daily.    . Biotin (BIOTIN MAXIMUM STRENGTH) 10 MG TABS Take 1 tablet by mouth daily.    . celecoxib (CELEBREX) 200 MG capsule Take 200 mg by mouth daily as needed. For arthritis    . Cholecalciferol (VITAMIN D3) 3000 UNITS TABS Take 1 capsule by mouth daily.    . clomiPRAMINE (ANAFRANIL) 25 MG capsule Take 25 mg by mouth 4 (four) times daily.     . clonazePAM (KLONOPIN) 2 MG tablet Take 2 mg by mouth at bedtime.    . Coenzyme Q10 (CO Q10) 200 MG CAPS Take 1 capsule by mouth daily.    Marland Kitchen diltiazem (CARDIZEM CD) 180 MG 24 hr capsule Take 2 capsules (360 mg total) by mouth daily. 30 capsule 3  . DULoxetine (CYMBALTA) 60 MG capsule Take 60 mg by mouth daily.      . Ferrous Fumarate (FERROCITE) 324 (106 FE) MG TABS Take 1 capsule by mouth 3 (three) times a week.    . furosemide (LASIX) 40 MG tablet Take 1 tablet by mouth daily.  5  . Hyaluronic Acid-Vitamin C (HYALURONIC ACID PO) Take 100 mg by  mouth daily.    . insulin glargine (LANTUS) 100 UNIT/ML injection Inject 36 Units into the skin at bedtime.     . Magnesium 300 MG CAPS Take 600 mg by mouth daily.    . metFORMIN (GLUCOPHAGE) 500 MG tablet Take 500 mg by mouth 2 (two) times daily with a meal.      . mirtazapine (REMERON) 15 MG tablet Take 15 mg by mouth at bedtime.      Marland Kitchen omeprazole (PRILOSEC) 20 MG capsule Take 1 capsule (20 mg total) by mouth daily. 90 capsule 3  . omeprazole (PRILOSEC) 20 MG capsule TAKE 1 CAPSULE BY MOUTH TWICE DAILY 60 capsule 0  . oxyCODONE (OXY IR/ROXICODONE) 5 MG immediate release tablet Take 5 mg by mouth 2 (two) times daily as needed. For pain  0  . potassium chloride (K-DUR) 10 MEQ tablet Take 1 tablet (10 mEq total) by mouth daily. 90 tablet 3  . Probiotic Product (PROBIOTIC ADVANCED PO) Take 1 capsule by  mouth daily.    . QUEtiapine (SEROQUEL) 100 MG tablet Take 100 mg by mouth at bedtime.    Marland Kitchen RA KRILL OIL 500 MG CAPS Take 1 capsule by mouth daily.    . saxagliptin HCl (ONGLYZA) 2.5 MG TABS tablet Take 5 mg by mouth daily.    Marland Kitchen warfarin (COUMADIN) 5 MG tablet See written post operative Coumadin instructions. (Patient taking differently: Take 2.5-5 mg by mouth every morning. Alternate taking 5mg  with 2.5mg  daily) 1 tablet 0   Assessment: 74yo female on chronic Coumadin PTA for h/o DVT.  INR is SUBtherapeutic.  Goal of Therapy:  INR 2-3 Monitor platelets by anticoagulation protocol: Yes   Plan:  Coumadin 5mg  po today x 1 INR daily  [name] A 02/23/2015,3:00 PM

## 2015-02-23 NOTE — Progress Notes (Signed)
Pt's nurse, Marcelino Duster informed us that pt needs a new IV. Attempted, with instructor present and permission from patient, to start a new IV because existing IV had infiltrated. Attempted one stick on the mid ulna-side of right arm, did not get blood return and was not able to start IV. Attempted to use a size 24 needle.

## 2015-02-23 NOTE — Progress Notes (Signed)
Inpatient Diabetes Program Recommendations  AACE/ADA: New Consensus Statement on Inpatient Glycemic Control (2015)  Target Ranges:  Prepandial:   less than 140 mg/dL      Peak postprandial:   less than 180 mg/dL (1-2 hours)      Critically ill patients:  140 - 180 mg/dL  Results for Rebecca [name], Rebecca [name] (MRN [id number]) as of 02/23/2015 11:14  Ref. Range 02/22/2015 08:50 02/23/2015 06:30  Glucose Latest Ref Range: 65-99 mg/dL 921 (H) 194 (H)   Review of Glycemic Control Diabetes history: DM2 Outpatient Diabetes medications: Lantus 36 units QHS, Metformin 500 mg BID, Onglyza 5 mg daily Current orders for Inpatient glycemic control: Lantus 36 units QHS  Inpatient Diabetes Program Recommendations: Correction (SSI): While inpatient, please consider ordering CBGs with Novolog correction scale ACHS. HgbA1C: Please consider ordering an A1C to evaluate glycemic control over the past 2-3 months.  Thanks, Orlando Penner, RN, MSN, CDE Diabetes Coordinator Inpatient Diabetes Program [phone] (Team Pager from 8am to 5pm) [phone] ([facility] office) [phone] [facility] office) [phone] [facility] office)

## 2015-02-24 LAB — PROTIME-INR
INR: 1.78 — ABNORMAL HIGH (ref 0.00–1.49)
Prothrombin Time: 20.7 seconds — ABNORMAL HIGH (ref 11.6–15.2)

## 2015-02-24 MED ORDER — ALBUTEROL SULFATE (2.5 MG/3ML) 0.083% IN NEBU: 2.5000 mg | INHALATION_SOLUTION | Freq: Four times a day (QID) | RESPIRATORY_TRACT | Status: DC | PRN

## 2015-02-24 MED ORDER — AZITHROMYCIN 250 MG PO TABS: 500.0000 mg | ORAL_TABLET | Freq: Every day | ORAL | Status: DC

## 2015-02-24 MED ORDER — WARFARIN SODIUM 5 MG PO TABS
5.0000 mg | ORAL_TABLET | Freq: Once | ORAL | Status: AC
  Administered 2015-02-24: 5 mg via ORAL
  Filled 2015-02-24: qty 1

## 2015-02-24 MED ORDER — AZITHROMYCIN 250 MG PO TABS: ORAL_TABLET | ORAL | Status: DC

## 2015-02-24 NOTE — Care Management Note (Signed)
Case Management Note  Patient Details  Name: JENEA KOHORST MRN: [id number] Date of Birth: July 31, 1940  Subjective/Objective:          Spoke with patient who is alert and oriented from home. Patient stated that she lives in handicapped accessible home with her son she has a DME rolling walker and cane. Denies issues with medications.           Action/Plan: Home with Self care.   Expected Discharge Date:  02/24/15               Expected Discharge Plan:  Home/Self Care  In-House Referral:     Discharge planning Services  CM Consult  Post Acute Care Choice:    Choice offered to:     DME Arranged:    DME Agency:     HH Arranged:    HH Agency:     Status of Service:  Completed, signed off  Medicare Important Message Given:  Yes Date Medicare IM Given:    Medicare IM give by:    Date Additional Medicare IM Given:    Additional Medicare Important Message give by:     If discussed at Long Length of Stay Meetings, dates discussed:    Additional Comments:  Adonis Huguenin, RN 02/24/2015, 1:24 PM

## 2015-02-24 NOTE — Discharge Summary (Signed)
Physician Discharge Summary  Rebecca [name]:[id number] DOB: 1940/09/27 DOA: 02/22/2015  PCP: Isabella Stalling, MD  Admit date: 02/22/2015 Discharge date: 02/24/2015   Recommendations for Outpatient Follow-up:  Patient slept all last night with continuous pulse oximetry did not desaturate below 91% off all nasal O2 therefore no need for home O2 continue Zithromax 250 by mouth daily for an additional 3 days as well as Proventil nebulizer 3 times a day and follow-up in the office within 5 days time Discharge Diagnoses:  Principal Problem:   Acute respiratory failure with hypoxia (HCC) Active Problems:   Lower leg DVT (deep venous thromboembolism), chronic (HCC)   Chronic anticoagulation-Coumadin   CAP (community acquired pneumonia)   Discharge Condition: Good and improving  Filed Weights   02/22/15 0824 02/22/15 1238  Weight: 240 lb (108.863 kg) 240 lb (108.863 kg)    History of present illness:  Patient 75 year old lady has multiple medical problems including chronic DVT anticoagulation on Coumadin hypertension hyperlipidemia insulin by the diabetes center with hypoxia and some upper respiratory infection chest x-ray was essentially negative she placed on Levaquin as an empiric antibiotic on the second hospital day albuterol nebulizer was added she had no evidence of desaturation or O2 improved movement and continuous pulse oximetry revealed no evidence of desaturation was discharged with the addition of Zithromax 250 by mouth daily for 3 additional days as well as albuterol nebulizer therapy 3 times a day otherwise she was hemodynamically stable and in no significant distress she had normal systolic function as well as a normal chest x-ray with no infiltrate observed  Hospital Course:  See history of present illness  Procedures:    Consultations:    Discharge Instructions  Discharge Instructions    Discharge instructions    Complete by:  As directed      Discharge  patient    Complete by:  As directed             Medication List    TAKE these medications        albuterol (2.5 MG/3ML) 0.083% nebulizer solution  Commonly known as:  PROVENTIL  Take 3 mLs (2.5 mg total) by nebulization every 6 (six) hours as needed for wheezing or shortness of breath.     azithromycin 250 MG tablet  Commonly known as:  ZITHROMAX  One tab P.O. Daily for three days  Start taking on:  02/25/2015     BIOTIN MAXIMUM STRENGTH 10 MG Tabs  Generic drug:  Biotin  Take 1 tablet by mouth daily.     celecoxib 200 MG capsule  Commonly known as:  CELEBREX  Take 200 mg by mouth daily as needed. For arthritis     clomiPRAMINE 25 MG capsule  Commonly known as:  ANAFRANIL  Take 25 mg by mouth 4 (four) times daily.     clonazePAM 2 MG tablet  Commonly known as:  KLONOPIN  Take 2 mg by mouth at bedtime.     Co Q10 200 MG Caps  Take 1 capsule by mouth daily.     diltiazem 180 MG 24 hr capsule  Commonly known as:  CARDIZEM CD  Take 2 capsules (360 mg total) by mouth daily.     DULoxetine 60 MG capsule  Commonly known as:  CYMBALTA  Take 60 mg by mouth daily.     FERROCITE 324 (106 Fe) MG Tabs  Generic drug:  Ferrous Fumarate  Take 1 capsule by mouth 3 (three) times a week.  furosemide 40 MG tablet  Commonly known as:  LASIX  Take 1 tablet by mouth daily.     HYALURONIC ACID PO  Take 100 mg by mouth daily.     insulin glargine 100 UNIT/ML injection  Commonly known as:  LANTUS  Inject 36 Units into the skin at bedtime.     Magnesium 300 MG Caps  Take 600 mg by mouth daily.     metFORMIN 500 MG tablet  Commonly known as:  GLUCOPHAGE  Take 500 mg by mouth 2 (two) times daily with a meal.     mirtazapine 15 MG tablet  Commonly known as:  REMERON  Take 15 mg by mouth at bedtime.     omeprazole 20 MG capsule  Commonly known as:  PRILOSEC  Take 1 capsule (20 mg total) by mouth daily.     oxyCODONE 5 MG immediate release tablet  Commonly known as:   Oxy IR/ROXICODONE  Take 5 mg by mouth 2 (two) times daily as needed. For pain     potassium chloride 10 MEQ tablet  Commonly known as:  K-DUR  Take 1 tablet (10 mEq total) by mouth daily.     PROBIOTIC ADVANCED PO  Take 1 capsule by mouth daily.     QUEtiapine 100 MG tablet  Commonly known as:  SEROQUEL  Take 100 mg by mouth at bedtime.     RA KRILL OIL 500 MG Caps  Take 1 capsule by mouth daily.     saxagliptin HCl 2.5 MG Tabs tablet  Commonly known as:  ONGLYZA  Take 5 mg by mouth daily.     vitamin C with rose hips 500 MG tablet  Take 500 mg by mouth daily.     Vitamin D3 3000 units Tabs  Take 1 capsule by mouth daily.     warfarin 5 MG tablet  Commonly known as:  COUMADIN  See written post operative Coumadin instructions.       Allergies  Allergen Reactions  . Codeine Anxiety  . Compazine Anaphylaxis  . Other Anaphylaxis, Rash and Other (See Comments)    Uncoded Allergy. Allergen: INOVAR Uncoded Allergy. Allergen: ALL ADHESIVES Uncoded Allergy. Allergen: SILK SUTURE Uncoded Allergy. Allergen: merthiolate Thermasol  . Penicillins Shortness Of Breath and Rash    Has patient had a PCN reaction causing immediate rash, facial/tongue/throat swelling, SOB or lightheadedness with hypotension: Yes Has patient had a PCN reaction causing severe rash involving mucus membranes or skin necrosis: No Has patient had a PCN reaction that required hospitalization No Has patient had a PCN reaction occurring within the last 10 years: Yes If all of the above answers are "NO", then may proceed with Cephalosporin use.   Marland Kitchen Morphine And Related Other (See Comments)    Patient states that it makes her lose her state of mind.   . Demerol [Meperidine] Rash      The results of significant diagnostics from this hospitalization (including imaging, microbiology, ancillary and laboratory) are listed below for reference.    Significant Diagnostic Studies: Dg Chest 2 View  02/22/2015   CLINICAL DATA:  Productive cough. EXAM: CHEST  2 VIEW COMPARISON:  September 05, 2014. FINDINGS: The heart size and mediastinal contours are within normal limits. Both lungs are clear. No pneumothorax or pleural effusion is noted. The visualized skeletal structures are unremarkable. IMPRESSION: No active cardiopulmonary disease. Electronically Signed   By: Lupita Raider, M.D.   On: 02/22/2015 09:52    Microbiology: Recent Results (from the  past 240 hour(s))  Blood Culture (routine x 2)     Status: None (Preliminary result)   Collection Time: 02/22/15  8:50 AM  Result Value Ref Range Status   Specimen Description BLOOD LEFT HAND DRAWN BY RN  Final   Special Requests BOTTLES DRAWN AEROBIC AND ANAEROBIC 5 CC EACH  Final   Culture NO GROWTH 2 DAYS  Final   Report Status PENDING  Incomplete  Blood Culture (routine x 2)     Status: None (Preliminary result)   Collection Time: 02/22/15  9:00 AM  Result Value Ref Range Status   Specimen Description BLOOD RIGHT ARM  Final   Special Requests BOTTLES DRAWN AEROBIC AND ANAEROBIC 10CC EACH  Final   Culture NO GROWTH 2 DAYS  Final   Report Status PENDING  Incomplete  Urine culture     Status: None   Collection Time: 02/22/15 12:00 PM  Result Value Ref Range Status   Specimen Description URINE, CLEAN CATCH  Final   Special Requests NONE  Final   Culture   Final    MULTIPLE SPECIES PRESENT, SUGGEST RECOLLECTION Performed at [facility]    Report Status 02/23/2015 FINAL  Final  Culture, sputum-assessment     Status: None   Collection Time: 02/23/15  8:27 PM  Result Value Ref Range Status   Specimen Description SPUTUM EXPECTORATED  Final   Special Requests NONE  Final   Sputum evaluation   Final    THIS SPECIMEN IS ACCEPTABLE FOR SPUTUM CULTURE PERFORMED AT [facility]    Report Status 02/23/2015 FINAL  Final     Labs: Basic Metabolic Panel:  Recent Labs Lab 02/22/15 0850 02/23/15 0630  NA 135 138  K 3.9 3.7  CL 101 102  CO2 26 27    GLUCOSE 224* 144*  BUN 23* 15  CREATININE 1.03* 0.89  CALCIUM 8.6* 8.7*   Liver Function Tests: No results for input(s): AST, ALT, ALKPHOS, BILITOT, PROT, ALBUMIN in the last 168 hours. No results for input(s): LIPASE, AMYLASE in the last 168 hours. No results for input(s): AMMONIA in the last 168 hours. CBC:  Recent Labs Lab 02/22/15 0850 02/23/15 0630  WBC 10.1 9.5  HGB 13.0 13.0  HCT 38.4 39.2  MCV 84.4 84.8  PLT 226 227   Cardiac Enzymes: No results for input(s): CKTOTAL, CKMB, CKMBINDEX, TROPONINI in the last 168 hours. BNP: BNP (last 3 results)  Recent Labs  09/05/14 1913 02/22/15 0850  BNP 631.0* 122.0*    ProBNP (last 3 results) No results for input(s): PROBNP in the last 8760 hours.  CBG:  Recent Labs Lab 02/22/15 0859  GLUCAP 187*       Signed:  Rayneisha Bouza M  [facility] Pager: [phone] 02/24/2015, 1:13 PM

## 2015-02-24 NOTE — Progress Notes (Signed)
ANTICOAGULATION CONSULT NOTE  Pharmacy Consult for Coumadin (chronic Rx PTA) Indication: H/O multiple DVT's  Allergies  Allergen Reactions  . Codeine Anxiety  . Compazine Anaphylaxis  . Other Anaphylaxis, Rash and Other (See Comments)    Uncoded Allergy. Allergen: INOVAR Uncoded Allergy. Allergen: ALL ADHESIVES Uncoded Allergy. Allergen: SILK SUTURE Uncoded Allergy. Allergen: merthiolate Thermasol  . Penicillins Shortness Of Breath and Rash    Has patient had a PCN reaction causing immediate rash, facial/tongue/throat swelling, SOB or lightheadedness with hypotension: Yes Has patient had a PCN reaction causing severe rash involving mucus membranes or skin necrosis: No Has patient had a PCN reaction that required hospitalization No Has patient had a PCN reaction occurring within the last 10 years: Yes If all of the above answers are "NO", then may proceed with Cephalosporin use.   Marland Kitchen Morphine And Related Other (See Comments)    Patient states that it makes her lose her state of mind.   . Demerol [Meperidine] Rash    Patient Measurements: Height:  (170.2 cm) Weight: 240 lb (108.863 kg) IBW/kg (Calculated) : 61.6  Vital Signs: Temp: 98 F (36.7 C) (01/20 0620) Temp Source: Oral (01/20 0620) BP: 139/97 mmHg (01/20 0620) Pulse Rate: 96 (01/20 0620)  Labs:  Recent Labs  02/22/15 0850 02/22/15 0921 02/23/15 0630 02/23/15 1317 02/24/15 0633  HGB 13.0  --  13.0  --   --   HCT 38.4  --  39.2  --   --   PLT 226  --  227  --   --   LABPROT  --  20.3*  --  19.8* 20.7*  INR  --  1.73*  --  1.68* 1.78*  CREATININE 1.03*  --  0.89  --   --     Estimated Creatinine Clearance: 69.4 mL/min (by C-G formula based on Cr of 0.89).   Medical History: Past Medical History  Diagnosis Date  . Diabetes mellitus   . DVT (deep venous thrombosis) (HCC)     multiple  . Neuropathy (HCC)   . Narcolepsy   . Cataplexy   . GERD (gastroesophageal reflux disease)   . Hiatal hernia    . Hypertension   . Shortness of breath   . CHF (congestive heart failure) (HCC)    Medications Prior to Admission  Medication Sig Dispense Refill  . Ascorbic Acid (VITAMIN C WITH ROSE HIPS) 500 MG tablet Take 500 mg by mouth daily.    . Biotin (BIOTIN MAXIMUM STRENGTH) 10 MG TABS Take 1 tablet by mouth daily.    . celecoxib (CELEBREX) 200 MG capsule Take 200 mg by mouth daily as needed. For arthritis    . Cholecalciferol (VITAMIN D3) 3000 UNITS TABS Take 1 capsule by mouth daily.    . clomiPRAMINE (ANAFRANIL) 25 MG capsule Take 25 mg by mouth 4 (four) times daily.     . clonazePAM (KLONOPIN) 2 MG tablet Take 2 mg by mouth at bedtime.    . Coenzyme Q10 (CO Q10) 200 MG CAPS Take 1 capsule by mouth daily.    Marland Kitchen diltiazem (CARDIZEM CD) 180 MG 24 hr capsule Take 2 capsules (360 mg total) by mouth daily. 30 capsule 3  . DULoxetine (CYMBALTA) 60 MG capsule Take 60 mg by mouth daily.      . Ferrous Fumarate (FERROCITE) 324 (106 FE) MG TABS Take 1 capsule by mouth 3 (three) times a week.    . furosemide (LASIX) 40 MG tablet Take 1 tablet by mouth daily.  5  . Hyaluronic Acid-Vitamin C (HYALURONIC ACID PO) Take 100 mg by mouth daily.    . insulin glargine (LANTUS) 100 UNIT/ML injection Inject 36 Units into the skin at bedtime.     . Magnesium 300 MG CAPS Take 600 mg by mouth daily.    . metFORMIN (GLUCOPHAGE) 500 MG tablet Take 500 mg by mouth 2 (two) times daily with a meal.      . mirtazapine (REMERON) 15 MG tablet Take 15 mg by mouth at bedtime.      Marland Kitchen omeprazole (PRILOSEC) 20 MG capsule Take 1 capsule (20 mg total) by mouth daily. 90 capsule 3  . omeprazole (PRILOSEC) 20 MG capsule TAKE 1 CAPSULE BY MOUTH TWICE DAILY 60 capsule 0  . oxyCODONE (OXY IR/ROXICODONE) 5 MG immediate release tablet Take 5 mg by mouth 2 (two) times daily as needed. For pain  0  . potassium chloride (K-DUR) 10 MEQ tablet Take 1 tablet (10 mEq total) by mouth daily. 90 tablet 3  . Probiotic Product (PROBIOTIC ADVANCED PO)  Take 1 capsule by mouth daily.    . QUEtiapine (SEROQUEL) 100 MG tablet Take 100 mg by mouth at bedtime.    Marland Kitchen RA KRILL OIL 500 MG CAPS Take 1 capsule by mouth daily.    . saxagliptin HCl (ONGLYZA) 2.5 MG TABS tablet Take 5 mg by mouth daily.    Marland Kitchen warfarin (COUMADIN) 5 MG tablet See written post operative Coumadin instructions. (Patient taking differently: Take 2.5-5 mg by mouth every morning. Alternate taking 5mg  with 2.5mg  daily) 1 tablet 0   Assessment: 74yo female on chronic Coumadin PTA for h/o DVT.  INR remains SUBtherapeutic.  Goal of Therapy:  INR 2-3 Monitor platelets by anticoagulation protocol: Yes   Plan:  Repeat Coumadin 5mg  po today x 1 INR daily  Mady Gemma 02/24/2015,12:23 PM

## 2015-02-24 NOTE — Care Management Important Message (Signed)
Important Message  Patient Details  Name: Rebecca [name] MRN: [id number] Date of Birth: 1940-08-13   Medicare Important Message Given:  Yes    Adonis Huguenin, RN 02/24/2015, 1:23 PM

## 2015-02-24 NOTE — Progress Notes (Signed)
Patient with orders to be discharge home. Discharge instructions given, patient verbalized understanding. Prescriptions given. Patient stable. Patient left in private vehicle with family.  

## 2015-02-24 NOTE — Progress Notes (Signed)
PHARMACIST - PHYSICIAN COMMUNICATION DR:   Janna Arch CONCERNING: Antibiotic IV to Oral Route Change Policy  RECOMMENDATION: This patient is receiving Aztihromycin by the intravenous route.  Based on criteria approved by the Pharmacy and Therapeutics Committee, the antibiotic(s) is/are being converted to the equivalent oral dose form(s).   DESCRIPTION: These criteria include:  Patient being treated for a respiratory tract infection, urinary tract infection, cellulitis or clostridium difficile associated diarrhea if on metronidazole  The patient is not neutropenic and does not exhibit a GI malabsorption state  The patient is eating (either orally or via tube) and/or has been taking other orally administered medications for a least 24 hours  The patient is improving clinically and has a Tmax < 100.5  If you have questions about this conversion, please contact the Pharmacy Department  [x]   [phone] )  [facility] []   [phone] )  [facility] []   [phone] )  [facility] []   [phone] )  [facility] []   [phone] )  [facility]   Mady Gemma, [facility]  02/24/2015 12:30 PM

## 2015-02-26 LAB — CULTURE, RESPIRATORY

## 2015-02-26 LAB — CULTURE, RESPIRATORY W GRAM STAIN: Culture: NORMAL

## 2015-02-27 LAB — CULTURE, BLOOD (ROUTINE X 2)
CULTURE: NO GROWTH
CULTURE: NO GROWTH

## 2015-04-03 ENCOUNTER — Other Ambulatory Visit (INDEPENDENT_AMBULATORY_CARE_PROVIDER_SITE_OTHER): Payer: Self-pay | Admitting: Internal Medicine

## 2015-04-11 ENCOUNTER — Other Ambulatory Visit (INDEPENDENT_AMBULATORY_CARE_PROVIDER_SITE_OTHER): Payer: Self-pay | Admitting: Internal Medicine

## 2015-05-28 ENCOUNTER — Telehealth (INDEPENDENT_AMBULATORY_CARE_PROVIDER_SITE_OTHER): Payer: Self-pay | Admitting: Internal Medicine

## 2015-05-29 NOTE — Telephone Encounter (Signed)
Patient will need a OV with [place] prior to further refills. Last seen in the office 07/29/2012. Or she may get from her PCP. She has been given a 6 month refill.

## 2015-06-05 NOTE — Telephone Encounter (Signed)
I called the patient and informed her that she will need an office visit with [place] prior to any additional refills or she could contact her PCP.  Offered to go ahead and make her an appointment, she stated that she'd call back.

## 2015-06-05 NOTE — Telephone Encounter (Signed)
I called the patient and informed her that she will need an office visit with us prior to any additional refills or she could contact her PCP.  Offered to go ahead and make her an appointment, she stated that she'd call back. °

## 2015-06-08 ENCOUNTER — Ambulatory Visit: Payer: Medicare Other | Admitting: Cardiovascular Disease

## 2015-06-26 ENCOUNTER — Telehealth: Payer: Self-pay | Admitting: Cardiovascular Disease

## 2015-06-26 ENCOUNTER — Ambulatory Visit: Payer: Medicare Other | Admitting: Cardiovascular Disease

## 2015-06-26 NOTE — Telephone Encounter (Signed)
Will forward to C Kuhn as Lorain Childes

## 2015-06-26 NOTE — Telephone Encounter (Signed)
Patient called office @ [phone] to reschedule appointment due to rain. Was rescheduled to Wednesday 5/24. Patient then called back at 0827 and wanted to still come for her appointment. Advised patient that she would have to be here by 8:50 and she stated that she would be. Patient arrive at 0901 and was told she would have to reschedule. Advised patient that she was told over the phone that she would have to be here by 0850.  Patient got mad and stated that she would just not come back at all and left. / tg

## 2015-06-28 ENCOUNTER — Encounter: Payer: Self-pay | Admitting: Cardiovascular Disease

## 2015-06-28 ENCOUNTER — Ambulatory Visit (INDEPENDENT_AMBULATORY_CARE_PROVIDER_SITE_OTHER): Payer: Medicare Other | Admitting: Cardiovascular Disease

## 2015-06-28 ENCOUNTER — Ambulatory Visit: Payer: Medicare Other | Admitting: Cardiovascular Disease

## 2015-06-28 VITALS — BP 142/80 | HR 73 | Ht 67.0 in | Wt 239.0 lb

## 2015-06-28 DIAGNOSIS — I38 Endocarditis, valve unspecified: Secondary | ICD-10-CM

## 2015-06-28 DIAGNOSIS — I050 Rheumatic mitral stenosis: Secondary | ICD-10-CM

## 2015-06-28 DIAGNOSIS — I350 Nonrheumatic aortic (valve) stenosis: Secondary | ICD-10-CM

## 2015-06-28 DIAGNOSIS — I5032 Chronic diastolic (congestive) heart failure: Secondary | ICD-10-CM | POA: Diagnosis not present

## 2015-06-28 DIAGNOSIS — I10 Essential (primary) hypertension: Secondary | ICD-10-CM

## 2015-06-28 NOTE — Progress Notes (Signed)
Patient ID: Rebecca [name], female   DOB: Jul 17, 1940, 75 y.o.   MRN: [id number]      SUBJECTIVE: The patient is a 75 year old woman who I performed inpatient cardiac consultation on in August 2016. She was hospitalized for urosepsis with acute hypoxic respiratory failure with both diastolic heart failure and valvular heart disease. Specifically, she had mild aortic stenosis and mild to moderate mitral stenosis and mild mitral regurgitation, moderate tricuspid regurgitation, and moderately elevated pulmonary pressures with grade 1 diastolic dysfunction with high filling pressures, LVEF 65-70% (09/06/14). She likely had a component of sleep apnea as well.  She also has a history of obesity, hypertension, chronic DVTs and takes warfarin, diabetes, narcolepsy, and depression.  Denies shortness of breath, leg swelling, and chest pain. Her son tells her she coughs at night.   Soc: Her son lives with her. She has been widowed since 2010 and had been married for 53 years.   Review of Systems: As per "subjective", otherwise negative.  Allergies  Allergen Reactions  . Codeine Anxiety  . Compazine Anaphylaxis  . Other Anaphylaxis, Rash and Other (See Comments)    Uncoded Allergy. Allergen: INOVAR Uncoded Allergy. Allergen: ALL ADHESIVES Uncoded Allergy. Allergen: SILK SUTURE Uncoded Allergy. Allergen: merthiolate Thermasol  . Penicillins Shortness Of Breath and Rash    Has patient had a PCN reaction causing immediate rash, facial/tongue/throat swelling, SOB or lightheadedness with hypotension: Yes Has patient had a PCN reaction causing severe rash involving mucus membranes or skin necrosis: No Has patient had a PCN reaction that required hospitalization No Has patient had a PCN reaction occurring within the last 10 years: Yes If all of the above answers are "NO", then may proceed with Cephalosporin use.   Marland Kitchen Morphine And Related Other (See Comments)    Patient states that it makes her lose her  state of mind.   . Demerol [Meperidine] Rash    Current Outpatient Prescriptions  Medication Sig Dispense Refill  . albuterol (PROVENTIL) (2.5 MG/3ML) 0.083% nebulizer solution Take 3 mLs (2.5 mg total) by nebulization every 6 (six) hours as needed for wheezing or shortness of breath. 75 mL 12  . Ascorbic Acid (VITAMIN C WITH ROSE HIPS) 500 MG tablet Take 500 mg by mouth daily.    Marland Kitchen azithromycin (ZITHROMAX) 250 MG tablet One tab P.O. Daily for three days 6 each 0  . Biotin (BIOTIN MAXIMUM STRENGTH) 10 MG TABS Take 1 tablet by mouth daily.    . celecoxib (CELEBREX) 200 MG capsule Take 200 mg by mouth daily as needed. For arthritis    . Cholecalciferol (VITAMIN D3) 3000 UNITS TABS Take 1 capsule by mouth daily.    . clomiPRAMINE (ANAFRANIL) 25 MG capsule Take 25 mg by mouth 4 (four) times daily.     . clonazePAM (KLONOPIN) 2 MG tablet Take 2 mg by mouth at bedtime.    . Coenzyme Q10 (CO Q10) 200 MG CAPS Take 1 capsule by mouth daily.    Marland Kitchen diltiazem (CARDIZEM CD) 180 MG 24 hr capsule Take 2 capsules (360 mg total) by mouth daily. 30 capsule 3  . DULoxetine (CYMBALTA) 60 MG capsule Take 60 mg by mouth daily.      . Ferrous Fumarate (FERROCITE) 324 (106 FE) MG TABS Take 1 capsule by mouth 3 (three) times a week.    . furosemide (LASIX) 40 MG tablet Take 1 tablet by mouth daily.  5  . Hyaluronic Acid-Vitamin C (HYALURONIC ACID PO) Take 100 mg by mouth daily.    Marland Kitchen  insulin glargine (LANTUS) 100 UNIT/ML injection Inject 36 Units into the skin at bedtime.     . Magnesium 300 MG CAPS Take 600 mg by mouth daily.    . metFORMIN (GLUCOPHAGE) 500 MG tablet Take 500 mg by mouth 2 (two) times daily with a meal.      . mirtazapine (REMERON) 15 MG tablet Take 15 mg by mouth at bedtime.      Marland Kitchen omeprazole (PRILOSEC) 20 MG capsule Take 1 capsule (20 mg total) by mouth daily. 90 capsule 3  . omeprazole (PRILOSEC) 20 MG capsule TAKE 1 CAPSULE BY MOUTH TWICE DAILY 60 capsule 0  . omeprazole (PRILOSEC) 20 MG capsule  TAKE 1 CAPSULE BY MOUTH TWICE DAILY 60 capsule 5  . oxyCODONE (OXY IR/ROXICODONE) 5 MG immediate release tablet Take 5 mg by mouth 2 (two) times daily as needed. For pain  0  . potassium chloride (K-DUR) 10 MEQ tablet Take 1 tablet (10 mEq total) by mouth daily. 90 tablet 3  . Probiotic Product (PROBIOTIC ADVANCED PO) Take 1 capsule by mouth daily.    . QUEtiapine (SEROQUEL) 100 MG tablet Take 100 mg by mouth at bedtime.    Marland Kitchen RA KRILL OIL 500 MG CAPS Take 1 capsule by mouth daily.    . saxagliptin HCl (ONGLYZA) 2.5 MG TABS tablet Take 5 mg by mouth daily.    Marland Kitchen warfarin (COUMADIN) 5 MG tablet See written post operative Coumadin instructions. (Patient taking differently: Take 2.5-5 mg by mouth every morning. Alternate taking 5mg  with 2.5mg  daily) 1 tablet 0   No current facility-administered medications for this visit.    Past Medical History  Diagnosis Date  . Diabetes mellitus   . DVT (deep venous thrombosis) (HCC)     multiple  . Neuropathy (HCC)   . Narcolepsy   . Cataplexy   . GERD (gastroesophageal reflux disease)   . Hiatal hernia   . Hypertension   . Shortness of breath   . CHF (congestive heart failure) [facility])     Past Surgical History  Procedure Laterality Date  . Abdominal hysterectomy    . Hemorroidectomy    . Carpal tunnel release    . Cholecystectomy    . Kidney surgery    . Total hip arthroplasty      bilateral  . Nephrostomy    . Carpectomy  12/26/2011    Procedure: CARPECTOMY;  Surgeon: Wyn Forster., MD;  Location: [facility];  Service: Orthopedics;  Laterality: Right;  PROXIMAL ROW CARPECTOMY RIGHT WRIST and neurectomy    Social History   Social History  . Marital Status: Widowed    Spouse Name: N/A  . Number of Children: N/A  . Years of Education: 11   Occupational History  .     Social History Main Topics  . Smoking status: Former Smoker    Quit date: 10/13/1988  . Smokeless tobacco: Never Used  . Alcohol Use: No  . Drug  Use: No  . Sexual Activity: Not on file   Other Topics Concern  . Not on file   Social History Narrative     Filed Vitals:   06/28/15 1530  BP: 142/80  Pulse: 73  Height: 5\' 7"  (1.702 m)  Weight: 239 lb (108.41 kg)  SpO2: 94%    PHYSICAL EXAM General: NAD HEENT: Normal. Neck: No JVD, no thyromegaly. Lungs: Clear to auscultation bilaterally with normal respiratory effort. CV: Nondisplaced PMI. Regular rate and rhythm, normal S1/S2, no S3/S4, 2/6  systolic murmur over RUSB. No pretibial or periankle edema.  Abdomen: Soft, nontender, obese. Neurologic: Alert and oriented x 3.  Psych: Normal affect. Skin: Normal. Musculoskeletal: No gross deformities. Extremities: No clubbing or cyanosis.    ECG: Most recent ECG reviewed.      ASSESSMENT AND PLAN: 1. Chronic diastolic heart failure with valvular heart disease: Continue to treat medically at this time. Stable. No changes to diuretic regimen.  2. Valvular heart disease: Echocardiogram results noted above. Medical therapy for now. Will monitor. Stable.  3. 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  4. Essential HTN: Mildly elevated. No changes.  Dispo: fu 6 months.  Prentice Docker, M.D., F.A.C.C.

## 2015-06-28 NOTE — Patient Instructions (Signed)

## 2015-07-19 ENCOUNTER — Other Ambulatory Visit: Payer: Self-pay | Admitting: Family Medicine

## 2015-07-19 DIAGNOSIS — Z1231 Encounter for screening mammogram for malignant neoplasm of breast: Secondary | ICD-10-CM

## 2015-08-07 ENCOUNTER — Ambulatory Visit
Admission: RE | Admit: 2015-08-07 | Discharge: 2015-08-07 | Disposition: A | Discharge status: Home / Self Care | Payer: Medicare Other | Source: Ambulatory Visit | Attending: Family Medicine | Admitting: Family Medicine

## 2015-08-07 DIAGNOSIS — Z1231 Encounter for screening mammogram for malignant neoplasm of breast: Secondary | ICD-10-CM

## 2015-11-11 ENCOUNTER — Other Ambulatory Visit (INDEPENDENT_AMBULATORY_CARE_PROVIDER_SITE_OTHER): Payer: Self-pay | Admitting: Internal Medicine

## 2015-12-06 ENCOUNTER — Ambulatory Visit (INDEPENDENT_AMBULATORY_CARE_PROVIDER_SITE_OTHER): Payer: Medicare Other | Admitting: Internal Medicine

## 2015-12-13 ENCOUNTER — Ambulatory Visit (INDEPENDENT_AMBULATORY_CARE_PROVIDER_SITE_OTHER): Payer: Medicare Other | Admitting: Internal Medicine

## 2015-12-13 ENCOUNTER — Encounter (INDEPENDENT_AMBULATORY_CARE_PROVIDER_SITE_OTHER): Payer: Self-pay | Admitting: Internal Medicine

## 2015-12-13 ENCOUNTER — Encounter (INDEPENDENT_AMBULATORY_CARE_PROVIDER_SITE_OTHER): Payer: Self-pay | Admitting: *Deleted

## 2015-12-13 ENCOUNTER — Encounter (INDEPENDENT_AMBULATORY_CARE_PROVIDER_SITE_OTHER): Payer: Self-pay

## 2015-12-13 VITALS — BP 134/62 | HR 60 | Temp 97.4°F | Ht 66.0 in | Wt 236.1 lb

## 2015-12-13 DIAGNOSIS — K760 Fatty (change of) liver, not elsewhere classified: Secondary | ICD-10-CM

## 2015-12-13 DIAGNOSIS — Z86010 Personal history of colonic polyps: Secondary | ICD-10-CM | POA: Diagnosis not present

## 2015-12-13 DIAGNOSIS — K3184 Gastroparesis: Secondary | ICD-10-CM

## 2015-12-13 NOTE — Patient Instructions (Signed)
Gastroparesis diet. She will let us know when she is ready for a colonoscopy.

## 2015-12-13 NOTE — Progress Notes (Signed)
Subjective:    Patient ID: Rebecca [name], female    DOB: April 20, 1940, 75 y.o.   MRN: [id number]  HPI Here today for f/u. She was last seen in 2014. Hx of gastroparesis. She tells me she loves coffee but it comes right back up.  When she bends over the acid reflux comes up.  She is not having any trouble eating. Her appetite is okay. She has a BM 3-4 times a day.  She tells me she has trouble with liquids. She does not drink coffee, soft drinks or tea. CMP     Component Value Date/Time   NA 138 02/23/2015 0630   NA 137 11/08/2014 0931   K 3.7 02/23/2015 0630   CL 102 02/23/2015 0630   CO2 27 02/23/2015 0630   GLUCOSE 144 (H) 02/23/2015 0630   BUN 15 02/23/2015 0630   BUN 29 (H) 11/08/2014 0931   CREATININE 0.89 02/23/2015 0630   CREATININE 0.95 07/29/2012 1130   CALCIUM 8.7 (L) 02/23/2015 0630   PROT 7.4 11/08/2014 0931   ALBUMIN 4.4 11/08/2014 0931   AST 21 11/08/2014 0931   ALT 18 11/08/2014 0931   ALKPHOS 76 11/08/2014 0931   BILITOT 0.2 11/08/2014 0931   GFRNONAA >60 02/23/2015 0630   GFRAA >60 02/23/2015 0630      History of colonic adenomas. Last exam was in May 2007.   Review of Systems Past Medical History:  Diagnosis Date  . Cataplexy   . CHF (congestive heart failure) (HCC)   . Diabetes mellitus   . DVT (deep venous thrombosis) (HCC)    multiple  . GERD (gastroesophageal reflux disease)   . Hiatal hernia   . Hypertension   . Narcolepsy   . Neuropathy (HCC)   . Shortness of breath     Past Surgical History:  Procedure Laterality Date  . ABDOMINAL HYSTERECTOMY    . CARPAL TUNNEL RELEASE    . CARPECTOMY  12/26/2011   Procedure: CARPECTOMY;  Surgeon: Wyn Forsterobert V Sypher Jr., MD;  Location:  SURGERY CENTER;  Service: Orthopedics;  Laterality: Right;  PROXIMAL ROW CARPECTOMY RIGHT WRIST and neurectomy  . CHOLECYSTECTOMY    . HEMORROIDECTOMY    . KIDNEY SURGERY    . NEPHROSTOMY    . TOTAL HIP ARTHROPLASTY     bilateral    Allergies    Allergen Reactions  . Codeine Anxiety  . Compazine Anaphylaxis  . Other Anaphylaxis, Rash and Other (See Comments)    Uncoded Allergy. Allergen: INOVAR Uncoded Allergy. Allergen: ALL ADHESIVES Uncoded Allergy. Allergen: SILK SUTURE Uncoded Allergy. Allergen: merthiolate Thermasol  . Penicillins Shortness Of Breath and Rash    Has patient had a PCN reaction causing immediate rash, facial/tongue/throat swelling, SOB or lightheadedness with hypotension: Yes Has patient had a PCN reaction causing severe rash involving mucus membranes or skin necrosis: No Has patient had a PCN reaction that required hospitalization No Has patient had a PCN reaction occurring within the last 10 years: Yes If all of the above answers are "NO", then may proceed with Cephalosporin use.   Marland Kitchen. Morphine And Related Other (See Comments)    Patient states that it makes her lose her state of mind.   . Demerol [Meperidine] Rash    Current Outpatient Prescriptions on File Prior to Visit  Medication Sig Dispense Refill  . Ascorbic Acid (VITAMIN C WITH ROSE HIPS) 500 MG tablet Take 500 mg by mouth daily.    . celecoxib (CELEBREX) 200 MG capsule Take 200  mg by mouth daily as needed. For arthritis    . Cholecalciferol (VITAMIN D3) 3000 UNITS TABS Take 1 capsule by mouth daily.    . clomiPRAMINE (ANAFRANIL) 25 MG capsule Take 25 mg by mouth 4 (four) times daily.     . clonazePAM (KLONOPIN) 2 MG tablet Take 2 mg by mouth at bedtime.    . Coenzyme Q10 (CO Q10) 200 MG CAPS Take 1 capsule by mouth daily.    . DULoxetine (CYMBALTA) 60 MG capsule Take 60 mg by mouth daily.      . Ferrous Fumarate (FERROCITE) 324 (106 FE) MG TABS Take 1 capsule by mouth 3 (three) times a week.    . furosemide (LASIX) 40 MG tablet Take 1 tablet by mouth daily.  5  . Hyaluronic Acid-Vitamin C (HYALURONIC ACID PO) Take 100 mg by mouth daily.    . Magnesium 300 MG CAPS Take 600 mg by mouth daily.    . metFORMIN (GLUCOPHAGE) 500 MG tablet Take 500  mg by mouth 2 (two) times daily with a meal.      . mirtazapine (REMERON) 15 MG tablet Take 15 mg by mouth at bedtime.      Marland Kitchen omeprazole (PRILOSEC) 20 MG capsule TAKE 1 CAPSULE BY MOUTH TWICE DAILY 60 capsule 0  . oxyCODONE (OXY IR/ROXICODONE) 5 MG immediate release tablet Take 5 mg by mouth 2 (two) times daily as needed. For pain  0  . potassium chloride (K-DUR) 10 MEQ tablet Take 1 tablet (10 mEq total) by mouth daily. 90 tablet 3  . Probiotic Product (PROBIOTIC ADVANCED PO) Take 1 capsule by mouth daily.    . saxagliptin HCl (ONGLYZA) 2.5 MG TABS tablet Take 5 mg by mouth daily.    Marland Kitchen warfarin (COUMADIN) 5 MG tablet See written post operative Coumadin instructions. (Patient taking differently: Take 2.5-5 mg by mouth every morning. 12Alternate taking 5mg  with 2.5mg  daily) 1 tablet 0  . diltiazem (CARDIZEM CD) 180 MG 24 hr capsule Take 2 capsules (360 mg total) by mouth daily. (Patient not taking: Reported on 12/13/2015) 30 capsule 3   No current facility-administered medications on file prior to visit.        Objective:   Physical Exam Blood pressure 134/62, pulse 60, temperature 97.4 F (36.3 C), height 5\' 6"  (1.676 m), weight 236 lb 1.6 oz (107.1 kg).  Alert and oriented. Skin warm and dry. Oral mucosa is moist.   . Sclera anicteric, conjunctivae is pink. Thyroid not enlarged. No cervical lymphadenopathy. Lungs clear. Heart regular rate and rhythm.  Abdomen is soft. Bowel sounds are positive. No hepatomegaly. No abdominal masses felt. No tenderness.  No edema to lower extremities.         Assessment & Plan:  Hx of gastroparesis. Needs to eat 4 small meals a day. Hx of colon polyps. She will let us know when she is ready for a colonoscopy.  NAFLD: US abdomen.

## 2015-12-18 ENCOUNTER — Ambulatory Visit (HOSPITAL_COMMUNITY)
Admission: RE | Admit: 2015-12-18 | Discharge: 2015-12-18 | Disposition: A | Discharge status: Home / Self Care | Payer: Medicare Other | Source: Ambulatory Visit | Attending: Internal Medicine | Admitting: Internal Medicine

## 2015-12-18 DIAGNOSIS — Z9049 Acquired absence of other specified parts of digestive tract: Secondary | ICD-10-CM | POA: Diagnosis not present

## 2015-12-18 DIAGNOSIS — K760 Fatty (change of) liver, not elsewhere classified: Secondary | ICD-10-CM | POA: Diagnosis present

## 2015-12-26 ENCOUNTER — Encounter: Payer: Self-pay | Admitting: Cardiovascular Disease

## 2015-12-26 ENCOUNTER — Ambulatory Visit (INDEPENDENT_AMBULATORY_CARE_PROVIDER_SITE_OTHER): Payer: Medicare Other | Admitting: Cardiovascular Disease

## 2015-12-26 VITALS — BP 124/64 | HR 89 | Ht 66.0 in | Wt 234.0 lb

## 2015-12-26 DIAGNOSIS — I5032 Chronic diastolic (congestive) heart failure: Secondary | ICD-10-CM | POA: Diagnosis not present

## 2015-12-26 DIAGNOSIS — I38 Endocarditis, valve unspecified: Secondary | ICD-10-CM

## 2015-12-26 DIAGNOSIS — I10 Essential (primary) hypertension: Secondary | ICD-10-CM | POA: Diagnosis not present

## 2015-12-26 DIAGNOSIS — I050 Rheumatic mitral stenosis: Secondary | ICD-10-CM

## 2015-12-26 DIAGNOSIS — I350 Nonrheumatic aortic (valve) stenosis: Secondary | ICD-10-CM

## 2015-12-26 DIAGNOSIS — Z7901 Long term (current) use of anticoagulants: Secondary | ICD-10-CM

## 2015-12-26 NOTE — Patient Instructions (Signed)
Your physician wants you to follow-up in: 6 months with NP  You will receive a reminder letter in the mail two months in advance. If you don't receive a letter, please call our office to schedule the follow-up appointment.     Your physician recommends that you continue on your current medications as directed. Please refer to the Current Medication list given to you today.    If you need a refill on your cardiac medications before your next appointment, please call your pharmacy.     Thank you for choosing [facility] !

## 2015-12-26 NOTE — Progress Notes (Signed)
SUBJECTIVE: The patient is a 75 year old woman who I performed inpatient cardiac consultation on in August 2016. She was hospitalized for urosepsis with acute hypoxic respiratory failure with both diastolic heart failure and valvular heart disease. Specifically, she had mild aortic stenosis and mild to moderate mitral stenosis and mild mitral regurgitation, moderate tricuspid regurgitation, and moderately elevated pulmonary pressures with grade 1 diastolic dysfunction with high filling pressures, LVEF 65-70% (09/06/14). She likely had a component of sleep apnea as well.  She also has a history of obesity, hypertension, chronic DVTs and takes warfarin, diabetes, narcolepsy, and depression.  Denies shortness of breath, leg swelling, and chest pain. Has severe narcolepsy. Complained of two days of blood-tinged sputum.   Soc: Her son lives with her. She has been widowed since 2010 and had been married for 53 years.   Review of Systems: As per "subjective", otherwise negative.  Allergies  Allergen Reactions  . Codeine Anxiety  . Compazine Anaphylaxis  . Other Anaphylaxis, Rash and Other (See Comments)    Uncoded Allergy. Allergen: INOVAR Uncoded Allergy. Allergen: ALL ADHESIVES Uncoded Allergy. Allergen: SILK SUTURE Uncoded Allergy. Allergen: merthiolate Thermasol  . Penicillins Shortness Of Breath and Rash    Has patient had a PCN reaction causing immediate rash, facial/tongue/throat swelling, SOB or lightheadedness with hypotension: Yes Has patient had a PCN reaction causing severe rash involving mucus membranes or skin necrosis: No Has patient had a PCN reaction that required hospitalization No Has patient had a PCN reaction occurring within the last 10 years: Yes If all of the above answers are "NO", then may proceed with Cephalosporin use.   Marland Kitchen. Morphine And Related Other (See Comments)    Patient states that it makes her lose her state of mind.   . Demerol [Meperidine] Rash      Current Outpatient Prescriptions  Medication Sig Dispense Refill  . Ascorbic Acid (VITAMIN C WITH ROSE HIPS) 500 MG tablet Take 500 mg by mouth daily.    . celecoxib (CELEBREX) 200 MG capsule Take 200 mg by mouth daily as needed. For arthritis    . Cholecalciferol (VITAMIN D3) 3000 UNITS TABS Take 1 capsule by mouth daily.    . clomiPRAMINE (ANAFRANIL) 25 MG capsule Take 25 mg by mouth 4 (four) times daily.     . clonazePAM (KLONOPIN) 2 MG tablet Take 2 mg by mouth at bedtime.    . Coenzyme Q10 (CO Q10) 200 MG CAPS Take 1 capsule by mouth daily.    Marland Kitchen. diltiazem (CARDIZEM CD) 180 MG 24 hr capsule Take 2 capsules (360 mg total) by mouth daily. 30 capsule 3  . DULoxetine (CYMBALTA) 60 MG capsule Take 60 mg by mouth daily.      . Ferrous Fumarate (FERROCITE) 324 (106 FE) MG TABS Take 1 capsule by mouth 3 (three) times a week.    . furosemide (LASIX) 40 MG tablet Take 1 tablet by mouth daily.  5  . Hyaluronic Acid-Vitamin C (HYALURONIC ACID PO) Take 100 mg by mouth daily.    . Magnesium 300 MG CAPS Take 600 mg by mouth daily.    . metFORMIN (GLUCOPHAGE) 500 MG tablet Take 500 mg by mouth 2 (two) times daily with a meal.      . mirtazapine (REMERON) 15 MG tablet Take 15 mg by mouth at bedtime.      Marland Kitchen. omeprazole (PRILOSEC) 20 MG capsule TAKE 1 CAPSULE BY MOUTH TWICE DAILY 60 capsule 0  . oxyCODONE (OXY IR/ROXICODONE) 5 MG  immediate release tablet Take 5 mg by mouth 2 (two) times daily as needed. For pain  0  . potassium chloride (K-DUR) 10 MEQ tablet Take 1 tablet (10 mEq total) by mouth daily. 90 tablet 3  . Probiotic Product (PROBIOTIC ADVANCED PO) Take 1 capsule by mouth daily.    . saxagliptin HCl (ONGLYZA) 2.5 MG TABS tablet Take 5 mg by mouth daily.    Marland Kitchen warfarin (COUMADIN) 5 MG tablet See written post operative Coumadin instructions. (Patient taking differently: Take 2.5-5 mg by mouth every morning. 12Alternate taking 5mg  with 2.5mg  daily) 1 tablet 0   No current facility-administered  medications for this visit.     Past Medical History:  Diagnosis Date  . Cataplexy   . CHF (congestive heart failure) (HCC)   . Diabetes mellitus   . DVT (deep venous thrombosis) (HCC)    multiple  . GERD (gastroesophageal reflux disease)   . Hiatal hernia   . Hypertension   . Narcolepsy   . Neuropathy (HCC)   . Shortness of breath     Past Surgical History:  Procedure Laterality Date  . ABDOMINAL HYSTERECTOMY    . CARPAL TUNNEL RELEASE    . CARPECTOMY  12/26/2011   Procedure: CARPECTOMY;  Surgeon: Wyn Forster., MD;  Location: [facility];  Service: Orthopedics;  Laterality: Right;  PROXIMAL ROW CARPECTOMY RIGHT WRIST and neurectomy  . CHOLECYSTECTOMY    . HEMORROIDECTOMY    . KIDNEY SURGERY    . NEPHROSTOMY    . TOTAL HIP ARTHROPLASTY     bilateral    Social History   Social History  . Marital status: Widowed    Spouse name: N/A  . Number of children: N/A  . Years of education: 30   Occupational History  .  Retired   Social History Main Topics  . Smoking status: Former Smoker    Quit date: 10/13/1988  . Smokeless tobacco: Never Used  . Alcohol use No  . Drug use: No  . Sexual activity: Not on file   Other Topics Concern  . Not on file   Social History Narrative  . No narrative on file     Vitals:   12/26/15 1301  BP: 124/64  Pulse: 89  SpO2: 93%  Weight: 234 lb (106.1 kg)  Height: 5\' 6"  (1.676 m)    PHYSICAL EXAM General: NAD HEENT: Normal. Neck: No JVD, no thyromegaly. Lungs: Clear to auscultation bilaterally with normal respiratory effort. CV: Nondisplaced PMI. Regular rate and rhythm, normal S1/S2, no S3/S4, 2/6 systolic murmur over RUSB. No pretibial or periankle edema.  Abdomen: Soft, nontender, obese. Neurologic: Alert and oriented x 3.  Psych: Normal affect. Skin: Normal. Musculoskeletal: No gross deformities. Extremities: No clubbing or cyanosis.     ECG: Most recent ECG  reviewed.      ASSESSMENT AND PLAN: 1. Chronic diastolic heart failure with valvular heart disease: Stable. No changes to diuretic regimen.  2. Valvular heart disease: Echocardiogram results noted above. Medical therapy for now. Will monitor. Stable.  3. 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  4. Essential HTN: Controlled. No changes.  Dispo: fu 6 months.   Prentice Docker, M.D., F.A.C.C.

## 2016-04-04 ENCOUNTER — Emergency Department (HOSPITAL_COMMUNITY)
Admission: EM | Admit: 2016-04-04 | Discharge: 2016-04-04 | Disposition: A | Discharge status: Home / Self Care | Payer: Medicare Other | Attending: Emergency Medicine | Admitting: Emergency Medicine

## 2016-04-04 ENCOUNTER — Encounter (HOSPITAL_COMMUNITY): Payer: Self-pay | Admitting: *Deleted

## 2016-04-04 DIAGNOSIS — Z87891 Personal history of nicotine dependence: Secondary | ICD-10-CM | POA: Insufficient documentation

## 2016-04-04 DIAGNOSIS — Y929 Unspecified place or not applicable: Secondary | ICD-10-CM | POA: Insufficient documentation

## 2016-04-04 DIAGNOSIS — S91112A Laceration without foreign body of left great toe without damage to nail, initial encounter: Secondary | ICD-10-CM | POA: Insufficient documentation

## 2016-04-04 DIAGNOSIS — I5032 Chronic diastolic (congestive) heart failure: Secondary | ICD-10-CM | POA: Diagnosis not present

## 2016-04-04 DIAGNOSIS — Z79899 Other long term (current) drug therapy: Secondary | ICD-10-CM | POA: Diagnosis not present

## 2016-04-04 DIAGNOSIS — R791 Abnormal coagulation profile: Secondary | ICD-10-CM | POA: Insufficient documentation

## 2016-04-04 DIAGNOSIS — Z7984 Long term (current) use of oral hypoglycemic drugs: Secondary | ICD-10-CM | POA: Diagnosis not present

## 2016-04-04 DIAGNOSIS — Y999 Unspecified external cause status: Secondary | ICD-10-CM | POA: Insufficient documentation

## 2016-04-04 DIAGNOSIS — W268XXA Contact with other sharp object(s), not elsewhere classified, initial encounter: Secondary | ICD-10-CM | POA: Insufficient documentation

## 2016-04-04 DIAGNOSIS — Z7901 Long term (current) use of anticoagulants: Secondary | ICD-10-CM | POA: Insufficient documentation

## 2016-04-04 DIAGNOSIS — S91111A Laceration without foreign body of right great toe without damage to nail, initial encounter: Secondary | ICD-10-CM

## 2016-04-04 DIAGNOSIS — I110 Hypertensive heart disease with heart failure: Secondary | ICD-10-CM | POA: Insufficient documentation

## 2016-04-04 DIAGNOSIS — Z23 Encounter for immunization: Secondary | ICD-10-CM | POA: Insufficient documentation

## 2016-04-04 DIAGNOSIS — S99922A Unspecified injury of left foot, initial encounter: Secondary | ICD-10-CM | POA: Diagnosis present

## 2016-04-04 DIAGNOSIS — Y9389 Activity, other specified: Secondary | ICD-10-CM | POA: Insufficient documentation

## 2016-04-04 DIAGNOSIS — E119 Type 2 diabetes mellitus without complications: Secondary | ICD-10-CM | POA: Diagnosis not present

## 2016-04-04 LAB — PROTIME-INR
INR: 1.92
PROTHROMBIN TIME: 22.3 s — AB (ref 11.4–15.2)

## 2016-04-04 LAB — CBC
HCT: 42.9 % (ref 36.0–46.0)
Hemoglobin: 14.7 g/dL (ref 12.0–15.0)
MCH: 29.9 pg (ref 26.0–34.0)
MCHC: 34.3 g/dL (ref 30.0–36.0)
MCV: 87.2 fL (ref 78.0–100.0)
PLATELETS: 209 10*3/uL (ref 150–400)
RBC: 4.92 MIL/uL (ref 3.87–5.11)
RDW: 13.8 % (ref 11.5–15.5)
WBC: 7.3 10*3/uL (ref 4.0–10.5)

## 2016-04-04 MED ORDER — TETANUS-DIPHTH-ACELL PERTUSSIS 5-2.5-18.5 LF-MCG/0.5 IM SUSP
0.5000 mL | Freq: Once | INTRAMUSCULAR | Status: AC
  Administered 2016-04-04: 0.5 mL via INTRAMUSCULAR
  Filled 2016-04-04: qty 0.5

## 2016-04-04 NOTE — ED Provider Notes (Signed)
[facility]-EM[facility]GENCY DEPT Provider Note   CSN: [id number] Arrival date & time: 04/04/16  1309     History   Chief Complaint No chief complaint on file.   HPI Rebecca [name] is a 77 y.o. female.  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 She states that she was clipping her toenails and missed the nail with the clipper and got some of her distal toe skin. It has been bleeding since. Family is concerned that she lost over a cup of blood. Despite pressure and gauze they were unable to get the bleeding to stop. This occurred around noon today. She denies any lightheadedness or dizziness. Family is also concerned because the patient has chronic toenail fungus and they don't want the fungus to get in her bloodstream. She does not know her last tetanus immunization.  Has chronic neuropathy in her feet.  Past Medical History:  Diagnosis Date  . Cataplexy   . CHF (congestive heart failure) (HCC)   . Diabetes mellitus   . DVT (deep venous thrombosis) (HCC)    multiple  . GERD (gastroesophageal reflux disease)   . Hiatal hernia   . Hypertension   . Narcolepsy   . Neuropathy (HCC)   . Shortness of breath     Patient Active Problem List   Diagnosis Date Noted  . Acute respiratory failure with hypoxia (HCC) 02/22/2015  . CAP (community acquired pneumonia) 02/22/2015  . Fall at home 09/21/2014  . Mild aortic stenosis 09/08/2014  . Mild to moderate  mitral stenosis 09/08/2014  . Chronic anticoagulation-Coumadin 09/08/2014  . Sepsis (HCC) 09/05/2014  . Lower leg DVT (deep venous thromboembolism), chronic (HCC) 09/05/2014  . Essential hypertension 09/05/2014  . Depression 09/05/2014  . Poorly controlled diabetes mellitus (HCC) 09/05/2014  . Hypomagnesemia 09/05/2014  . Hyponatremia 09/05/2014  . Pyelonephritis 09/05/2014  . Chronic diastolic CHF (congestive heart failure) (HCC)   . SLAC (scapholunate  advanced collapse) of wrist 10/30/2011  . GERD (gastroesophageal reflux disease) 07/08/2011  . Gastroparesis 07/08/2011  . IDA (iron deficiency anemia) 07/08/2011  . History of colonic polyps 07/08/2011  . 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 NAFLD (nonalcoholic fatty liver disease) 40/98/1191  . Obesity-BMI 37 07/08/2011    Past Surgical History:  Procedure Laterality Date  . ABDOMINAL HYSTERECTOMY    . CARPAL TUNNEL RELEASE    . CARPECTOMY  12/26/2011   Procedure: CARPECTOMY;  Surgeon: Wyn Forster., MD;  Location: [facility];  Service: Orthopedics;  Laterality: Right;  PROXIMAL ROW CARPECTOMY RIGHT WRIST and neurectomy  . CHOLECYSTECTOMY    . HEMORROIDECTOMY    . KIDNEY SURGERY    . NEPHROSTOMY    . TOTAL HIP ARTHROPLASTY     bilateral    OB History    No data available       Home Medications    Prior to Admission medications   Medication Sig Start Date End Date Taking? Authorizing Provider  Ascorbic Acid (VITAMIN C WITH ROSE HIPS) 500 MG tablet Take 500 mg by mouth daily.   Yes Historical Provider, MD  BYDUREON 2 MG PEN Inject 2 mg into the skin once a week. 01/03/16  Yes Historical Provider, MD  celecoxib (CELEBREX) 200 MG capsule Take 200 mg by mouth daily. For arthritis   Yes Historical Provider, MD  Cholecalciferol (VITAMIN D3) 3000 UNITS TABS Take 1 capsule by mouth daily.   Yes Historical Provider, MD  clomiPRAMINE (ANAFRANIL) 25 MG capsule Take 25 mg by mouth 4 (four) times daily.    Yes Historical Provider, MD  clonazePAM (KLONOPIN) 2 MG tablet Take 2 mg by mouth at bedtime.   Yes Historical Provider, MD  Coenzyme Q10 (CO Q10) 200 MG CAPS Take 1 capsule by mouth daily.   Yes Historical Provider, MD  diltiazem (CARDIZEM CD) 180 MG 24 hr capsule Take 2 capsules (360 mg total) by mouth daily. 10/30/10  Yes [name], MD  DULoxetine (CYMBALTA) 60 MG capsule Take 60 mg by mouth daily.     Yes Historical Provider, MD  Ferrous Fumarate  (FERROCITE) 324 (106 FE) MG TABS Take 1 capsule by mouth 3 (three) times a week.   Yes Historical Provider, MD  furosemide (LASIX) 40 MG tablet Take 20 mg by mouth 2 (two) times daily.  12/01/14  Yes Historical Provider, MD  Hyaluronic Acid-Vitamin C (HYALURONIC ACID PO) Take 100 mg by mouth daily.   Yes Historical Provider, MD  insulin glargine (LANTUS) 100 unit/mL SOPN Inject 24 Units into the skin at bedtime.   Yes Historical Provider, MD  linagliptin (TRADJENTA) 5 MG TABS tablet Take 5 mg by mouth daily.   Yes Historical Provider, MD  Magnesium 300 MG CAPS Take 600 mg by mouth daily.   Yes Historical Provider, MD  metFORMIN (GLUCOPHAGE) 500 MG tablet Take 500 mg by mouth 2 (two) times daily with a meal.     Yes Historical Provider, MD  mirtazapine (REMERON) 15 MG tablet Take 15 mg by mouth at bedtime.     Yes Historical Provider, MD  omeprazole (PRILOSEC) 20 MG capsule TAKE 1 CAPSULE BY MOUTH TWICE DAILY 04/03/15  Yes Len Blalock, NP  oxyCODONE (OXY IR/ROXICODONE) 5 MG immediate release tablet Take 5 mg by mouth 2 (two) times daily as needed. For pain 08/26/14  Yes Historical Provider, MD  potassium chloride (K-DUR) 10 MEQ tablet Take 1 tablet (10 mEq total) by mouth daily. 09/21/14  Yes Dyann Kief, PA-C  Probiotic Product (PROBIOTIC ADVANCED PO) Take 1 capsule by mouth daily.   Yes Historical Provider, MD  QUEtiapine (SEROQUEL) 100 MG tablet Take 100 mg by mouth at bedtime.   Yes Historical Provider, MD  warfarin (COUMADIN) 5 MG tablet See written post operative Coumadin instructions. Patient taking differently: Take 2.5-5 mg by mouth every morning. 12Alternate taking 5mg  with 2.5mg  daily 12/26/11  Yes Josephine Igo, MD    Family History Family History  Problem Relation Age of Onset  . Heart disease Father   . Diabetes    . Stroke Mother   . Scleroderma Brother     Social History Social History  Substance Use Topics  . Smoking status: Former Smoker    Quit date: 10/13/1988  .  Smokeless tobacco: Never Used  . Alcohol use No     Allergies   Codeine; Compazine; Other; Penicillins; Morphine and related; and Demerol [meperidine]   Review of Systems Review of Systems  Skin: Positive for wound.  Neurological: Negative for dizziness and light-headedness.  All other systems reviewed and are negative.    Physical Exam Updated Vital Signs BP 163/74 (BP Location: Left Arm)   Pulse 66   Temp 98.2 F (36.8 C) (Oral)   Resp 16   SpO2 100%   Physical Exam  Constitutional: She is oriented to person, place, and time. She appears well-developed and well-nourished. No distress.  HENT:  Head: Normocephalic and atraumatic.  Right Ear: External ear normal.  Left Ear: External ear normal.  Nose: Nose normal.  Eyes: Right eye exhibits no discharge. Left eye exhibits no discharge.  Cardiovascular: Normal rate and regular rhythm.   Pulmonary/Chest: Effort normal.  Musculoskeletal:       Left foot: There is laceration.       Feet:  Neurological: She is alert and oriented to person, place, and time.  Skin: Skin is warm and dry. She is not diaphoretic.  Nursing note and vitals reviewed.    ED Treatments / Results  Labs (all labs ordered are listed, but only abnormal results are displayed) Labs Reviewed  PROTIME-INR - Abnormal; Notable for the following:       Result Value   Prothrombin Time 22.3 (*)    All other components within normal limits  CBC    EKG  EKG Interpretation None       Radiology No results found.  Procedures Procedures (including critical care time)  Medications Ordered in ED Medications  Tdap (BOOSTRIX) injection 0.5 mL (0.5 mLs Intramuscular Given 04/04/16 1549)     Initial Impression / Assessment and Plan / ED Course  I have reviewed the triage vital signs and the nursing notes.  Pertinent labs & imaging results that were available during my care of the patient were reviewed by me and considered in my medical decision  making (see chart for details).  Clinical Course as of Apr 04 1701  Thu Apr 04, 2016  1530 Bleeding is currently controlled. Will clean and irrigate wound. The wound is superficial but does not approximate. I think this needs to heal by secondary intention. Will check CBC, INR given family concern for degree of blood loss  [SG]  1702 Patient's foot was cleaned. Discussed using topical antibiotics given the wound will not be closed as there is nothing to approximate in this very superficial injury. Also discussed further wound care. Follow-up with PCP. INR is just barely subtherapeutic, patient notes that it was therapeutic earlier in the week and thus will have her follow-up with PCP for further Coumadin management.  [SG]    Clinical Course User Index [SG] Pricilla Loveless, MD    Final Clinical Impressions(s) / ED Diagnoses   Final diagnoses:  Laceration of right great toe without foreign body present or damage to nail, initial encounter  Subtherapeutic international normalized ratio (INR)    New Prescriptions New Prescriptions   No medications on file     Pricilla Loveless, MD 04/04/16 1703

## 2016-06-18 ENCOUNTER — Encounter: Payer: Self-pay | Admitting: Adult Health

## 2016-06-18 ENCOUNTER — Ambulatory Visit (INDEPENDENT_AMBULATORY_CARE_PROVIDER_SITE_OTHER): Payer: Medicare Other | Admitting: Adult Health

## 2016-06-18 VITALS — BP 128/72 | HR 44 | Ht 66.0 in | Wt 236.0 lb

## 2016-06-18 DIAGNOSIS — I350 Nonrheumatic aortic (valve) stenosis: Secondary | ICD-10-CM

## 2016-06-18 DIAGNOSIS — I5032 Chronic diastolic (congestive) heart failure: Secondary | ICD-10-CM

## 2016-06-18 DIAGNOSIS — I4821 Permanent atrial fibrillation: Secondary | ICD-10-CM

## 2016-06-18 DIAGNOSIS — I482 Chronic atrial fibrillation: Secondary | ICD-10-CM | POA: Diagnosis not present

## 2016-06-18 NOTE — Progress Notes (Signed)
Cardiology Office Note   Date:  06/18/2016   ID:  Rebecca [name], DOB Jul 02, 1940, MRN [id number]  PCP:  [name], MD  Cardiologist:  Inis Sizer, NP   Chief Complaint  Patient presents with  . Congestive Heart Failure  . Hypertension      History of Present Illness: Rebecca [name] is a 76 y.o. female who presents for ongoing assessment and management of chronic diastolic CHF and valvular heart disease, with mild aortic valve stenosis and moderate mitral valve stenosis. She has further history of hypertension, chronic DVT's on coumadin, diabetes, narcolepsy, obesity and depression. She was last seen in the office on 12/26/2015, and was found to be stable from cardiology standpoint. Coumadin is managed by PCP.  She is without cardiac complaint today. Her son has taken her car keys from her because she has been not driving well lately and she is upset about that. She denies bleeding, dyspnea or edema. She is due to see PCP next week for coumadin dosing.   Past Medical History:  Diagnosis Date  . Cataplexy   . CHF (congestive heart failure) (HCC)   . Diabetes mellitus   . DVT (deep venous thrombosis) (HCC)    multiple  . GERD (gastroesophageal reflux disease)   . Hiatal hernia   . Hypertension   . Narcolepsy   . Neuropathy   . Shortness of breath     Past Surgical History:  Procedure Laterality Date  . ABDOMINAL HYSTERECTOMY    . CARPAL TUNNEL RELEASE    . CARPECTOMY  12/26/2011   Procedure: CARPECTOMY;  Surgeon: Wyn Forster., MD;  Location: [facility];  Service: Orthopedics;  Laterality: Right;  PROXIMAL ROW CARPECTOMY RIGHT WRIST and neurectomy  . CHOLECYSTECTOMY    . HEMORROIDECTOMY    . KIDNEY SURGERY    . NEPHROSTOMY    . TOTAL HIP ARTHROPLASTY     bilateral     Current Outpatient Prescriptions  Medication Sig Dispense Refill  . Ascorbic Acid (VITAMIN C WITH ROSE HIPS) 500 MG tablet Take 500 mg by mouth daily.    Marland Kitchen  BYDUREON 2 MG PEN Inject 2 mg into the skin once a week.  5  . celecoxib (CELEBREX) 200 MG capsule Take 200 mg by mouth daily. For arthritis    . Cholecalciferol (VITAMIN D3) 3000 UNITS TABS Take 1 capsule by mouth daily.    . clomiPRAMINE (ANAFRANIL) 25 MG capsule Take 25 mg by mouth 4 (four) times daily.     . clonazePAM (KLONOPIN) 2 MG tablet Take 2 mg by mouth at bedtime.    . Coenzyme Q10 (CO Q10) 200 MG CAPS Take 1 capsule by mouth daily.    Marland Kitchen diltiazem (CARDIZEM CD) 180 MG 24 hr capsule Take 2 capsules (360 mg total) by mouth daily. 30 capsule 3  . DULoxetine (CYMBALTA) 60 MG capsule Take 60 mg by mouth daily.      . Ferrous Fumarate (FERROCITE) 324 (106 FE) MG TABS Take 1 capsule by mouth 3 (three) times a week.    . furosemide (LASIX) 40 MG tablet Take 20 mg by mouth 2 (two) times daily.   5  . Hyaluronic Acid-Vitamin C (HYALURONIC ACID PO) Take 100 mg by mouth daily.    . insulin glargine (LANTUS) 100 unit/mL SOPN Inject 24 Units into the skin at bedtime.    Marland Kitchen linagliptin (TRADJENTA) 5 MG TABS tablet Take 5 mg by mouth daily.    . Magnesium 300  MG CAPS Take 600 mg by mouth daily.    . metFORMIN (GLUCOPHAGE) 500 MG tablet Take 500 mg by mouth 2 (two) times daily with a meal.      . mirtazapine (REMERON) 15 MG tablet Take 15 mg by mouth at bedtime.      Marland Kitchen omeprazole (PRILOSEC) 20 MG capsule TAKE 1 CAPSULE BY MOUTH TWICE DAILY 60 capsule 0  . oxyCODONE (OXY IR/ROXICODONE) 5 MG immediate release tablet Take 5 mg by mouth 2 (two) times daily as needed. For pain  0  . potassium chloride (K-DUR) 10 MEQ tablet Take 1 tablet (10 mEq total) by mouth daily. 90 tablet 3  . Probiotic Product (PROBIOTIC ADVANCED PO) Take 1 capsule by mouth daily.    . QUEtiapine (SEROQUEL) 100 MG tablet Take 100 mg by mouth at bedtime.    Marland Kitchen warfarin (COUMADIN) 5 MG tablet See written post operative Coumadin instructions. (Patient taking differently: Take 2.5-5 mg by mouth every morning. 12Alternate taking 5mg  with  2.5mg  daily) 1 tablet 0   No current facility-administered medications for this visit.     Allergies:   Codeine; Compazine; Other; Penicillins; Morphine and related; and Demerol [meperidine]    Social History:  The patient  reports that she quit smoking about 27 years ago. She has never used smokeless tobacco. She reports that she does not drink alcohol or use drugs.   Family History:  The patient's family history includes Heart disease in her father; Scleroderma in her brother; Stroke in her mother.    ROS: All other systems are reviewed and negative. Unless otherwise mentioned in H&P    PHYSICAL EXAM: VS:  BP 128/72   Pulse (!) 44   Ht 5\' 6"  (1.676 m)   Wt 236 lb (107 kg)   SpO2 93%   BMI 38.09 kg/m  , BMI Body mass index is 38.09 kg/m. GEN: Well nourished, well developed, in no acute distress Obese HEENT: normal  Neck: no JVD, carotid bruits, or masses Cardiac: IRRR; 1/6 holo-systolic murmur heard at that apex and RSB.  , rubs, or gallops,no edema  Respiratory:  clear to auscultation bilaterally, normal work of breathing GI: soft, nontender, nondistended, + BS MS: no deformity or atrophy  Left foot more cyanotic, with second metatarsal wound from spider bite. Does not appear to be healing well. She denies pain. Multiple varicosities.  Skin: warm and dry, no rash Neuro:  Strength and sensation are intact Psych: euthymic mood, full affect   EKG:  Atrial fibrillation, with RBBB. Rate of 48 bpm.   Recent Labs: 04/04/2016: Hemoglobin 14.7; Platelets 209    Lipid Panel No results found for: CHOL, TRIG, HDL, CHOLHDL, VLDL, LDLCALC, LDLDIRECT    Wt Readings from Last 3 Encounters:  06/18/16 236 lb (107 kg)  12/26/15 234 lb (106.1 kg)  12/13/15 236 lb 1.6 oz (107.1 kg)      Other studies Reviewed: Echocardiogram 09-29-2014 - Left ventricle: The cavity size was normal. There was mild   concentric hypertrophy. Systolic function was vigorous. The   estimated ejection  fraction was in the range of 65% to 70%. Wall   motion was normal; there were no regional wall motion   abnormalities. Doppler parameters are consistent with abnormal   left ventricular relaxation (grade 1 diastolic dysfunction).   Doppler parameters are consistent with high ventricular filling   pressure. - Ventricular septum: Septal motion showed abnormal function and   dyssynergy. These changes are consistent with intraventricular   conduction delay. -  Aortic valve: Moderately calcified annulus. Trileaflet; mildly   thickened, mildly calcified leaflets. There was mild stenosis.   Peak velocity (S): 221 cm/s. Mean gradient (S): 12 mm Hg. Valve   area (VTI): 1.82 cm^2. Valve area (Vmax): 1.66 cm^2. Valve area   (Vmean): 1.61 cm^2. - Mitral valve: Moderately to severely calcified annulus. Mildly   thickened leaflets . The findings are consistent with mild to   moderate stenosis. There was mild regurgitation. Valve area by   pressure half-time: 2.16 cm^2. Valve area by continuity equation   (using LVOT flow): 1.01 cm^2. - Left atrium: The atrium was severely dilated. - Tricuspid valve: There was moderate regurgitation. - Pulmonary arteries: PA peak pressure: 49 mm Hg (S). Moderately   elevated pulmonary pressures.    ASSESSMENT AND PLAN:  1. Atrial fib: Heart rate is well controlled on diltiazem. Coumadin dosing per Dr. Delbert Harness. No changes in regimen.  2. Chronic diastolic CHF: Although she has multiple varicosities she does not have any significant edema.   3. Sore to second metatarsal on the left: I would recommend she see podiatrist, for ongoing evaluation of feel and wound on her toe, in light of diabetes history, and need for toe nail evaluation and management.   4. Valvular Heart Disease:  Will need repeat echo in 6 months. She is asymptomatic at this time.    Current medicines are reviewed at length with the patient today.    Labs/ tests ordered today include:  None  Bettey Mare. Liborio Nixon, ANP, AACC   06/18/2016 2:00 PM    [facility] [address], [place], [place] [address] Phone: [phone]; Fax: [phone]

## 2016-06-18 NOTE — Patient Instructions (Signed)
Your physician wants you to follow-up in: 6 months K Lawrence NP You will receive a reminder letter in the mail two months in advance. If you don't receive a letter, please call our office to schedule the follow-up appointment.     Your physician recommends that you continue on your current medications as directed. Please refer to the Current Medication list given to you today.     If you need a refill on your cardiac medications before your next appointment, please call your pharmacy.     Thank you for choosing [facility] !        

## 2016-09-24 ENCOUNTER — Other Ambulatory Visit (HOSPITAL_COMMUNITY)
Admission: RE | Admit: 2016-09-24 | Discharge: 2016-09-24 | Disposition: A | Discharge status: Home / Self Care | Payer: Medicare Other | Source: Ambulatory Visit | Attending: Internal Medicine | Admitting: Internal Medicine

## 2016-09-24 DIAGNOSIS — D510 Vitamin B12 deficiency anemia due to intrinsic factor deficiency: Secondary | ICD-10-CM | POA: Diagnosis present

## 2016-09-24 DIAGNOSIS — Z7901 Long term (current) use of anticoagulants: Secondary | ICD-10-CM | POA: Insufficient documentation

## 2016-09-24 DIAGNOSIS — D500 Iron deficiency anemia secondary to blood loss (chronic): Secondary | ICD-10-CM | POA: Insufficient documentation

## 2016-09-24 LAB — FERRITIN: Ferritin: 73 ng/mL (ref 11–307)

## 2016-09-24 LAB — CBC WITH DIFFERENTIAL/PLATELET
Basophils Absolute: 0 10*3/uL (ref 0.0–0.1)
Basophils Relative: 1 %
Eosinophils Absolute: 0.3 10*3/uL (ref 0.0–0.7)
Eosinophils Relative: 4 %
HEMATOCRIT: 42.7 % (ref 36.0–46.0)
HEMOGLOBIN: 14 g/dL (ref 12.0–15.0)
LYMPHS ABS: 1.7 10*3/uL (ref 0.7–4.0)
Lymphocytes Relative: 20 %
MCH: 29.4 pg (ref 26.0–34.0)
MCHC: 32.8 g/dL (ref 30.0–36.0)
MCV: 89.5 fL (ref 78.0–100.0)
MONOS PCT: 7 %
Monocytes Absolute: 0.6 10*3/uL (ref 0.1–1.0)
NEUTROS ABS: 5.8 10*3/uL (ref 1.7–7.7)
NEUTROS PCT: 68 %
Platelets: 267 10*3/uL (ref 150–400)
RBC: 4.77 MIL/uL (ref 3.87–5.11)
RDW: 14.5 % (ref 11.5–15.5)
WBC: 8.4 10*3/uL (ref 4.0–10.5)

## 2016-09-24 LAB — IRON AND TIBC
Iron: 71 ug/dL (ref 28–170)
SATURATION RATIOS: 21 % (ref 10.4–31.8)
TIBC: 346 ug/dL (ref 250–450)
UIBC: 275 ug/dL

## 2016-09-24 LAB — PROTIME-INR
INR: 1.37
Prothrombin Time: 17 seconds — ABNORMAL HIGH (ref 11.4–15.2)

## 2016-09-25 ENCOUNTER — Ambulatory Visit (HOSPITAL_COMMUNITY)
Admission: RE | Admit: 2016-09-25 | Discharge: 2016-09-25 | Disposition: A | Discharge status: Home / Self Care | Payer: Medicare Other | Source: Ambulatory Visit | Attending: Family Medicine | Admitting: Family Medicine

## 2016-09-25 ENCOUNTER — Other Ambulatory Visit (HOSPITAL_COMMUNITY): Payer: Self-pay | Admitting: Family Medicine

## 2016-09-25 DIAGNOSIS — Z96643 Presence of artificial hip joint, bilateral: Secondary | ICD-10-CM | POA: Diagnosis not present

## 2016-09-25 DIAGNOSIS — W19XXXS Unspecified fall, sequela: Secondary | ICD-10-CM

## 2016-09-25 DIAGNOSIS — M8580 Other specified disorders of bone density and structure, unspecified site: Secondary | ICD-10-CM | POA: Diagnosis not present

## 2016-09-25 DIAGNOSIS — M545 Low back pain: Secondary | ICD-10-CM | POA: Insufficient documentation

## 2016-09-25 DIAGNOSIS — M5136 Other intervertebral disc degeneration, lumbar region: Secondary | ICD-10-CM | POA: Diagnosis not present

## 2016-09-25 DIAGNOSIS — M419 Scoliosis, unspecified: Secondary | ICD-10-CM | POA: Diagnosis not present

## 2016-10-01 ENCOUNTER — Other Ambulatory Visit: Payer: Self-pay | Admitting: Family Medicine

## 2016-10-01 DIAGNOSIS — Z1231 Encounter for screening mammogram for malignant neoplasm of breast: Secondary | ICD-10-CM

## 2016-10-23 ENCOUNTER — Inpatient Hospital Stay (HOSPITAL_COMMUNITY)
Admission: EM | Admit: 2016-10-23 | Discharge: 2016-10-28 | DRG: 242 | Disposition: A | Discharge status: Skilled Nursing Facility | Payer: Medicare Other | Attending: Cardiology | Admitting: Cardiology

## 2016-10-23 ENCOUNTER — Emergency Department (HOSPITAL_COMMUNITY): Payer: Medicare Other

## 2016-10-23 ENCOUNTER — Encounter (HOSPITAL_COMMUNITY): Payer: Self-pay | Admitting: Emergency Medicine

## 2016-10-23 ENCOUNTER — Inpatient Hospital Stay (HOSPITAL_COMMUNITY): Payer: Medicare Other

## 2016-10-23 DIAGNOSIS — G47411 Narcolepsy with cataplexy: Secondary | ICD-10-CM | POA: Diagnosis present

## 2016-10-23 DIAGNOSIS — F419 Anxiety disorder, unspecified: Secondary | ICD-10-CM | POA: Diagnosis present

## 2016-10-23 DIAGNOSIS — Y831 Surgical operation with implant of artificial internal device as the cause of abnormal reaction of the patient, or of later complication, without mention of misadventure at the time of the procedure: Secondary | ICD-10-CM | POA: Diagnosis not present

## 2016-10-23 DIAGNOSIS — Z791 Long term (current) use of non-steroidal anti-inflammatories (NSAID): Secondary | ICD-10-CM | POA: Diagnosis not present

## 2016-10-23 DIAGNOSIS — Z86718 Personal history of other venous thrombosis and embolism: Secondary | ICD-10-CM | POA: Diagnosis not present

## 2016-10-23 DIAGNOSIS — R402423 Glasgow coma scale score 9-12, at hospital admission: Secondary | ICD-10-CM | POA: Diagnosis present

## 2016-10-23 DIAGNOSIS — Z794 Long term (current) use of insulin: Secondary | ICD-10-CM | POA: Diagnosis not present

## 2016-10-23 DIAGNOSIS — Z8249 Family history of ischemic heart disease and other diseases of the circulatory system: Secondary | ICD-10-CM

## 2016-10-23 DIAGNOSIS — Z7901 Long term (current) use of anticoagulants: Secondary | ICD-10-CM

## 2016-10-23 DIAGNOSIS — Z885 Allergy status to narcotic agent status: Secondary | ICD-10-CM | POA: Diagnosis not present

## 2016-10-23 DIAGNOSIS — Z87891 Personal history of nicotine dependence: Secondary | ICD-10-CM

## 2016-10-23 DIAGNOSIS — I4510 Unspecified right bundle-branch block: Secondary | ICD-10-CM | POA: Diagnosis present

## 2016-10-23 DIAGNOSIS — L7632 Postprocedural hematoma of skin and subcutaneous tissue following other procedure: Secondary | ICD-10-CM | POA: Diagnosis not present

## 2016-10-23 DIAGNOSIS — Z880 Allergy status to penicillin: Secondary | ICD-10-CM | POA: Diagnosis not present

## 2016-10-23 DIAGNOSIS — R296 Repeated falls: Secondary | ICD-10-CM | POA: Diagnosis present

## 2016-10-23 DIAGNOSIS — I080 Rheumatic disorders of both mitral and aortic valves: Secondary | ICD-10-CM | POA: Diagnosis present

## 2016-10-23 DIAGNOSIS — Z90710 Acquired absence of both cervix and uterus: Secondary | ICD-10-CM

## 2016-10-23 DIAGNOSIS — N179 Acute kidney failure, unspecified: Secondary | ICD-10-CM | POA: Diagnosis not present

## 2016-10-23 DIAGNOSIS — Z87892 Personal history of anaphylaxis: Secondary | ICD-10-CM

## 2016-10-23 DIAGNOSIS — E669 Obesity, unspecified: Secondary | ICD-10-CM | POA: Diagnosis present

## 2016-10-23 DIAGNOSIS — R410 Disorientation, unspecified: Secondary | ICD-10-CM | POA: Diagnosis present

## 2016-10-23 DIAGNOSIS — K219 Gastro-esophageal reflux disease without esophagitis: Secondary | ICD-10-CM | POA: Diagnosis present

## 2016-10-23 DIAGNOSIS — I350 Nonrheumatic aortic (valve) stenosis: Secondary | ICD-10-CM | POA: Diagnosis not present

## 2016-10-23 DIAGNOSIS — I5033 Acute on chronic diastolic (congestive) heart failure: Secondary | ICD-10-CM | POA: Diagnosis present

## 2016-10-23 DIAGNOSIS — Z9181 History of falling: Secondary | ICD-10-CM

## 2016-10-23 DIAGNOSIS — M25511 Pain in right shoulder: Secondary | ICD-10-CM

## 2016-10-23 DIAGNOSIS — Y92230 Patient room in hospital as the place of occurrence of the external cause: Secondary | ICD-10-CM | POA: Diagnosis not present

## 2016-10-23 DIAGNOSIS — Z96643 Presence of artificial hip joint, bilateral: Secondary | ICD-10-CM | POA: Diagnosis present

## 2016-10-23 DIAGNOSIS — Z950 Presence of cardiac pacemaker: Secondary | ICD-10-CM | POA: Diagnosis not present

## 2016-10-23 DIAGNOSIS — D500 Iron deficiency anemia secondary to blood loss (chronic): Secondary | ICD-10-CM | POA: Diagnosis not present

## 2016-10-23 DIAGNOSIS — I351 Nonrheumatic aortic (valve) insufficiency: Secondary | ICD-10-CM | POA: Diagnosis not present

## 2016-10-23 DIAGNOSIS — R001 Bradycardia, unspecified: Secondary | ICD-10-CM | POA: Diagnosis present

## 2016-10-23 DIAGNOSIS — I110 Hypertensive heart disease with heart failure: Secondary | ICD-10-CM | POA: Diagnosis present

## 2016-10-23 DIAGNOSIS — Z888 Allergy status to other drugs, medicaments and biological substances status: Secondary | ICD-10-CM

## 2016-10-23 DIAGNOSIS — E1142 Type 2 diabetes mellitus with diabetic polyneuropathy: Secondary | ICD-10-CM | POA: Diagnosis present

## 2016-10-23 DIAGNOSIS — I442 Atrioventricular block, complete: Principal | ICD-10-CM | POA: Diagnosis present

## 2016-10-23 DIAGNOSIS — I342 Nonrheumatic mitral (valve) stenosis: Secondary | ICD-10-CM | POA: Diagnosis not present

## 2016-10-23 DIAGNOSIS — J810 Acute pulmonary edema: Secondary | ICD-10-CM

## 2016-10-23 DIAGNOSIS — Z6830 Body mass index (BMI) 30.0-30.9, adult: Secondary | ICD-10-CM

## 2016-10-23 DIAGNOSIS — E1140 Type 2 diabetes mellitus with diabetic neuropathy, unspecified: Secondary | ICD-10-CM | POA: Diagnosis present

## 2016-10-23 DIAGNOSIS — I10 Essential (primary) hypertension: Secondary | ICD-10-CM | POA: Diagnosis not present

## 2016-10-23 DIAGNOSIS — Z95818 Presence of other cardiac implants and grafts: Secondary | ICD-10-CM

## 2016-10-23 DIAGNOSIS — I5032 Chronic diastolic (congestive) heart failure: Secondary | ICD-10-CM | POA: Diagnosis not present

## 2016-10-23 DIAGNOSIS — I459 Conduction disorder, unspecified: Secondary | ICD-10-CM

## 2016-10-23 DIAGNOSIS — I441 Atrioventricular block, second degree: Secondary | ICD-10-CM | POA: Diagnosis not present

## 2016-10-23 DIAGNOSIS — J811 Chronic pulmonary edema: Secondary | ICD-10-CM | POA: Diagnosis present

## 2016-10-23 HISTORY — DX: Essential (primary) hypertension: I10

## 2016-10-23 HISTORY — DX: Unspecified diastolic (congestive) heart failure: I50.30

## 2016-10-23 HISTORY — DX: Type 2 diabetes mellitus without complications: E11.9

## 2016-10-23 HISTORY — DX: Rheumatic mitral stenosis: I05.0

## 2016-10-23 HISTORY — DX: Nonrheumatic aortic (valve) stenosis: I35.0

## 2016-10-23 HISTORY — DX: Personal history of other venous thrombosis and embolism: Z86.718

## 2016-10-23 LAB — I-STAT CHEM 8, ED
BUN: 27 mg/dL — AB (ref 6–20)
CREATININE: 1.1 mg/dL — AB (ref 0.44–1.00)
Calcium, Ion: 1.19 mmol/L (ref 1.15–1.40)
Chloride: 102 mmol/L (ref 101–111)
GLUCOSE: 131 mg/dL — AB (ref 65–99)
HEMATOCRIT: 39 % (ref 36.0–46.0)
HEMOGLOBIN: 13.3 g/dL (ref 12.0–15.0)
POTASSIUM: 4 mmol/L (ref 3.5–5.1)
Sodium: 138 mmol/L (ref 135–145)
TCO2: 25 mmol/L (ref 22–32)

## 2016-10-23 LAB — COMPREHENSIVE METABOLIC PANEL
ALK PHOS: 60 U/L (ref 38–126)
ALT: 19 U/L (ref 14–54)
AST: 21 U/L (ref 15–41)
Albumin: 3.3 g/dL — ABNORMAL LOW (ref 3.5–5.0)
Anion gap: 11 (ref 5–15)
BUN: 25 mg/dL — AB (ref 6–20)
CALCIUM: 9.1 mg/dL (ref 8.9–10.3)
CHLORIDE: 100 mmol/L — AB (ref 101–111)
CO2: 23 mmol/L (ref 22–32)
Creatinine, Ser: 1.08 mg/dL — ABNORMAL HIGH (ref 0.44–1.00)
GFR calc Af Amer: 56 mL/min — ABNORMAL LOW (ref >60)
GFR, EST NON AFRICAN AMERICAN: 48 mL/min — AB (ref >60)
Glucose, Bld: 132 mg/dL — ABNORMAL HIGH (ref 65–99)
Potassium: 3.9 mmol/L (ref 3.5–5.1)
Sodium: 134 mmol/L — ABNORMAL LOW (ref 135–145)
Total Bilirubin: 0.5 mg/dL (ref 0.3–1.2)
Total Protein: 7.7 g/dL (ref 6.5–8.1)

## 2016-10-23 LAB — BLOOD GAS, ARTERIAL
ACID-BASE DEFICIT: 1.8 mmol/L (ref 0.0–2.0)
BICARBONATE: 23 mmol/L (ref 20.0–28.0)
Drawn by: 382351
FIO2: 28
O2 Saturation: 93.7 %
PCO2 ART: 35.9 mmHg (ref 32.0–48.0)
PO2 ART: 75.5 mmHg — AB (ref 83.0–108.0)
Patient temperature: 37
pH, Arterial: 7.407 (ref 7.350–7.450)

## 2016-10-23 LAB — DIFFERENTIAL
Basophils Absolute: 0 10*3/uL (ref 0.0–0.1)
Basophils Relative: 0 %
Eosinophils Absolute: 0.9 10*3/uL — ABNORMAL HIGH (ref 0.0–0.7)
Eosinophils Relative: 9 %
Lymphocytes Relative: 18 %
Lymphs Abs: 1.8 10*3/uL (ref 0.7–4.0)
MONO ABS: 1.2 10*3/uL — AB (ref 0.1–1.0)
Monocytes Relative: 12 %
NEUTROS PCT: 61 %
Neutro Abs: 6.2 10*3/uL (ref 1.7–7.7)

## 2016-10-23 LAB — BASIC METABOLIC PANEL
Anion gap: 10 (ref 5–15)
BUN: 26 mg/dL — AB (ref 6–20)
CO2: 23 mmol/L (ref 22–32)
Calcium: 9 mg/dL (ref 8.9–10.3)
Chloride: 103 mmol/L (ref 101–111)
Creatinine, Ser: 1.11 mg/dL — ABNORMAL HIGH (ref 0.44–1.00)
GFR calc Af Amer: 54 mL/min — ABNORMAL LOW (ref >60)
GFR, EST NON AFRICAN AMERICAN: 47 mL/min — AB (ref >60)
GLUCOSE: 135 mg/dL — AB (ref 65–99)
POTASSIUM: 4 mmol/L (ref 3.5–5.1)
Sodium: 136 mmol/L (ref 135–145)

## 2016-10-23 LAB — URINALYSIS, ROUTINE W REFLEX MICROSCOPIC
Bilirubin Urine: NEGATIVE
Glucose, UA: NEGATIVE mg/dL
Hgb urine dipstick: NEGATIVE
Ketones, ur: NEGATIVE mg/dL
Leukocytes, UA: NEGATIVE
NITRITE: NEGATIVE
PH: 5 (ref 5.0–8.0)
Protein, ur: 100 mg/dL — AB
SPECIFIC GRAVITY, URINE: 1.015 (ref 1.005–1.030)

## 2016-10-23 LAB — CBC
HCT: 38.6 % (ref 36.0–46.0)
Hemoglobin: 13.1 g/dL (ref 12.0–15.0)
MCH: 28.9 pg (ref 26.0–34.0)
MCHC: 33.9 g/dL (ref 30.0–36.0)
MCV: 85 fL (ref 78.0–100.0)
PLATELETS: 355 10*3/uL (ref 150–400)
RBC: 4.54 MIL/uL (ref 3.87–5.11)
RDW: 14.1 % (ref 11.5–15.5)
WBC: 10.2 10*3/uL (ref 4.0–10.5)

## 2016-10-23 LAB — PROTIME-INR
INR: 1.96
Prothrombin Time: 22.2 seconds — ABNORMAL HIGH (ref 11.4–15.2)

## 2016-10-23 LAB — ECHOCARDIOGRAM COMPLETE: WEIGHTICAEL: 3680 [oz_av]

## 2016-10-23 LAB — CBG MONITORING, ED
GLUCOSE-CAPILLARY: 117 mg/dL — AB (ref 65–99)
GLUCOSE-CAPILLARY: 121 mg/dL — AB (ref 65–99)

## 2016-10-23 LAB — I-STAT CG4 LACTIC ACID, ED: LACTIC ACID, VENOUS: 1.41 mmol/L (ref 0.5–1.9)

## 2016-10-23 LAB — ACETAMINOPHEN LEVEL

## 2016-10-23 LAB — ETHANOL: Alcohol, Ethyl (B): <5 mg/dL (ref <5)

## 2016-10-23 LAB — RAPID URINE DRUG SCREEN, HOSP PERFORMED
AMPHETAMINES: NOT DETECTED
BARBITURATES: NOT DETECTED
BENZODIAZEPINES: POSITIVE — AB
Cocaine: NOT DETECTED
Opiates: NOT DETECTED
Tetrahydrocannabinol: NOT DETECTED

## 2016-10-23 LAB — SODIUM, URINE, RANDOM: SODIUM UR: 55 mmol/L

## 2016-10-23 LAB — SALICYLATE LEVEL

## 2016-10-23 LAB — APTT: aPTT: 64 seconds — ABNORMAL HIGH (ref 24–36)

## 2016-10-23 LAB — I-STAT TROPONIN, ED: TROPONIN I, POC: 0.01 ng/mL (ref 0.00–0.08)

## 2016-10-23 LAB — TROPONIN I

## 2016-10-23 MED ORDER — WARFARIN - PHARMACIST DOSING INPATIENT
Status: DC
  Administered 2016-10-24: 16:00:00

## 2016-10-23 MED ORDER — ASPIRIN EC 81 MG PO TBEC
81.0000 mg | DELAYED_RELEASE_TABLET | Freq: Every day | ORAL | Status: DC
  Administered 2016-10-23 – 2016-10-28 (×5): 81 mg via ORAL
  Filled 2016-10-23 (×5): qty 1

## 2016-10-23 MED ORDER — SODIUM CHLORIDE 0.9% FLUSH: 3.0000 mL | INTRAVENOUS | Status: DC | PRN

## 2016-10-23 MED ORDER — FUROSEMIDE 10 MG/ML IJ SOLN
60.0000 mg | Freq: Two times a day (BID) | INTRAMUSCULAR | Status: DC
  Administered 2016-10-23 – 2016-10-26 (×8): 60 mg via INTRAVENOUS
  Filled 2016-10-23 (×8): qty 6

## 2016-10-23 MED ORDER — ONDANSETRON HCL 4 MG/2ML IJ SOLN: 4.0000 mg | Freq: Four times a day (QID) | INTRAMUSCULAR | Status: DC | PRN

## 2016-10-23 MED ORDER — WARFARIN SODIUM 5 MG PO TABS
5.0000 mg | ORAL_TABLET | Freq: Once | ORAL | Status: AC
  Administered 2016-10-23: 5 mg via ORAL
  Filled 2016-10-23: qty 1

## 2016-10-23 MED ORDER — CLOMIPRAMINE HCL 25 MG PO CAPS
25.0000 mg | ORAL_CAPSULE | Freq: Four times a day (QID) | ORAL | Status: DC
  Administered 2016-10-23 – 2016-10-28 (×17): 25 mg via ORAL
  Filled 2016-10-23 (×31): qty 1

## 2016-10-23 MED ORDER — DULOXETINE HCL 60 MG PO CPEP
60.0000 mg | ORAL_CAPSULE | Freq: Every day | ORAL | Status: DC
  Administered 2016-10-23 – 2016-10-28 (×5): 60 mg via ORAL
  Filled 2016-10-23 (×2): qty 1
  Filled 2016-10-23: qty 2
  Filled 2016-10-23 (×2): qty 1

## 2016-10-23 MED ORDER — LISINOPRIL 5 MG PO TABS
5.0000 mg | ORAL_TABLET | Freq: Every day | ORAL | Status: DC
  Administered 2016-10-23 – 2016-10-24 (×2): 5 mg via ORAL
  Filled 2016-10-23 (×2): qty 1

## 2016-10-23 MED ORDER — HYDRALAZINE HCL 20 MG/ML IJ SOLN: 10.0000 mg | Freq: Three times a day (TID) | INTRAMUSCULAR | Status: DC | PRN

## 2016-10-23 MED ORDER — MIRTAZAPINE 7.5 MG PO TABS
15.0000 mg | ORAL_TABLET | Freq: Every day | ORAL | Status: DC
  Administered 2016-10-23 – 2016-10-27 (×5): 15 mg via ORAL
  Filled 2016-10-23: qty 1
  Filled 2016-10-23: qty 2
  Filled 2016-10-23: qty 1
  Filled 2016-10-23 (×2): qty 2
  Filled 2016-10-23 (×2): qty 1

## 2016-10-23 MED ORDER — ALBUTEROL SULFATE (2.5 MG/3ML) 0.083% IN NEBU
2.5000 mg | INHALATION_SOLUTION | RESPIRATORY_TRACT | Status: DC | PRN
  Administered 2016-10-23: 2.5 mg via RESPIRATORY_TRACT
  Filled 2016-10-23: qty 3

## 2016-10-23 MED ORDER — SODIUM CHLORIDE 0.9% FLUSH
3.0000 mL | Freq: Two times a day (BID) | INTRAVENOUS | Status: DC
  Administered 2016-10-23 – 2016-10-28 (×10): 3 mL via INTRAVENOUS

## 2016-10-23 MED ORDER — ACETAMINOPHEN 325 MG PO TABS
650.0000 mg | ORAL_TABLET | ORAL | Status: DC | PRN
  Administered 2016-10-23: 650 mg via ORAL
  Filled 2016-10-23: qty 2

## 2016-10-23 MED ORDER — QUETIAPINE FUMARATE 100 MG PO TABS
100.0000 mg | ORAL_TABLET | Freq: Once | ORAL | Status: AC
  Administered 2016-10-23: 100 mg via ORAL
  Filled 2016-10-23 (×2): qty 1

## 2016-10-23 MED ORDER — FUROSEMIDE 10 MG/ML IJ SOLN
60.0000 mg | Freq: Once | INTRAMUSCULAR | Status: AC
  Administered 2016-10-23: 60 mg via INTRAVENOUS
  Filled 2016-10-23: qty 6

## 2016-10-23 MED ORDER — AMLODIPINE BESYLATE 5 MG PO TABS
5.0000 mg | ORAL_TABLET | Freq: Every day | ORAL | Status: DC
  Administered 2016-10-23 – 2016-10-26 (×3): 5 mg via ORAL
  Filled 2016-10-23 (×3): qty 1

## 2016-10-23 MED ORDER — CLONAZEPAM 0.5 MG PO TABS
1.0000 mg | ORAL_TABLET | Freq: Once | ORAL | Status: AC
  Administered 2016-10-23: 1 mg via ORAL
  Filled 2016-10-23: qty 2

## 2016-10-23 MED ORDER — SODIUM CHLORIDE 0.9 % IV SOLN: 250.0000 mL | INTRAVENOUS | Status: DC | PRN

## 2016-10-23 NOTE — ED Notes (Signed)
Emergency contact [name] [phone]

## 2016-10-23 NOTE — ED Triage Notes (Signed)
Ems called out for fall tonight. Per family pt has not been able to get around home the last couple of days.

## 2016-10-23 NOTE — ED Provider Notes (Signed)
Pt now on bipap for pulmonary edema She is awake/alert, tolerating well Awaiting admission    Zadie Rhine, MD 10/23/16 [phone]

## 2016-10-23 NOTE — H&P (Signed)
[facility] History and Physical  SALLY MENARD ZOX:[id number] DOB: August 09, 1940 DOA: 10/23/2016  Referring physician:  PCP: [name], MD   Chief Complaint: sob  HPI: Rebecca [name] is a 76 y.o. female past medical history of cataplexy, CHF, diabetes, DVT, hypertension, narcolepsy, neuropathy who presents with shortness of breath. History mostly given by granddaughter. Patient had been short of breath for some time. Possibly 3 months or more. Worse lately. Patient also has a cough but it's been nonproductive.  ED Course: Patient with bradycardia. EDP spoke to cardiology about bradycardia. They recommend holding AV nodal blockers and observation. Hospitalist consulted for admission.  Review of Systems:  Limited history due to shortness of breath. Patient also slightly sedated possibly from bedtime Klonopin.   Past Medical History:  Diagnosis Date  . Cataplexy   . CHF (congestive heart failure) (HCC)   . Diabetes mellitus   . DVT (deep venous thrombosis) (HCC)    multiple  . GERD (gastroesophageal reflux disease)   . Hiatal hernia   . Hypertension   . Narcolepsy   . Neuropathy   . Shortness of breath    Past Surgical History:  Procedure Laterality Date  . ABDOMINAL HYSTERECTOMY    . CARPAL TUNNEL RELEASE    . CARPECTOMY  12/26/2011   Procedure: CARPECTOMY;  Surgeon: Wyn Forster., MD;  Location: [facility];  Service: Orthopedics;  Laterality: Right;  PROXIMAL ROW CARPECTOMY RIGHT WRIST and neurectomy  . CHOLECYSTECTOMY    . HEMORROIDECTOMY    . KIDNEY SURGERY    . NEPHROSTOMY    . TOTAL HIP ARTHROPLASTY     bilateral   Social History:  reports that she quit smoking about 28 years ago. She has never used smokeless tobacco. She reports that she does not drink alcohol or use drugs.  Allergies  Allergen Reactions  . Codeine Anxiety  . Compazine Anaphylaxis  . Other Anaphylaxis, Rash and Other (See Comments)    Uncoded Allergy. Allergen:  INOVAR Uncoded Allergy. Allergen: ALL ADHESIVES Uncoded Allergy. Allergen: SILK SUTURE Uncoded Allergy. Allergen: merthiolate Thermasol  . Penicillins Shortness Of Breath and Rash    Has patient had a PCN reaction causing immediate rash, facial/tongue/throat swelling, SOB or lightheadedness with hypotension: Yes Has patient had a PCN reaction causing severe rash involving mucus membranes or skin necrosis: No Has patient had a PCN reaction that required hospitalization No Has patient had a PCN reaction occurring within the last 10 years: Yes If all of the above answers are "NO", then may proceed with Cephalosporin use.   Marland Kitchen Morphine And Related Other (See Comments)    Patient states that it makes her lose her state of mind.   . Demerol [Meperidine] Rash    Family History  Problem Relation Age of Onset  . Heart disease Father   . Diabetes Unknown   . Stroke Mother   . Scleroderma Brother      Prior to Admission medications   Medication Sig Start Date End Date Taking? Authorizing Provider  Ascorbic Acid (VITAMIN C WITH ROSE HIPS) 500 MG tablet Take 500 mg by mouth daily.    [provider]  BYDUREON 2 MG PEN Inject 2 mg into the skin once a week. 01/03/16   [provider]  celecoxib (CELEBREX) 200 MG capsule Take 200 mg by mouth daily. For arthritis    [provider]  Cholecalciferol (VITAMIN D3) 3000 UNITS TABS Take 1 capsule by mouth daily.    [provider]  clomiPRAMINE (ANAFRANIL) 25 MG capsule Take 25 mg by mouth 4 (four) times daily.     [provider]  clonazePAM (KLONOPIN) 2 MG tablet Take 2 mg by mouth at bedtime.    [provider]  Coenzyme Q10 (CO Q10) 200 MG CAPS Take 1 capsule by mouth daily.    [provider]  diltiazem (CARDIZEM CD) 180 MG 24 hr capsule Take 2 capsules (360 mg total) by mouth daily. 10/30/10   [name], MD  DULoxetine (CYMBALTA) 60 MG capsule Take 60 mg by mouth daily.       [provider]  Ferrous Fumarate (FERROCITE) 324 (106 FE) MG TABS Take 1 capsule by mouth 3 (three) times a week.    [provider]  furosemide (LASIX) 40 MG tablet Take 20 mg by mouth 2 (two) times daily.  12/01/14   [provider]  Hyaluronic Acid-Vitamin C (HYALURONIC ACID PO) Take 100 mg by mouth daily.    [provider]  insulin glargine (LANTUS) 100 unit/mL SOPN Inject 24 Units into the skin at bedtime.    [provider]  linagliptin (TRADJENTA) 5 MG TABS tablet Take 5 mg by mouth daily.    [provider]  Magnesium 300 MG CAPS Take 600 mg by mouth daily.    [provider]  metFORMIN (GLUCOPHAGE) 500 MG tablet Take 500 mg by mouth 2 (two) times daily with a meal.      [provider]  mirtazapine (REMERON) 15 MG tablet Take 15 mg by mouth at bedtime.      [provider]  omeprazole (PRILOSEC) 20 MG capsule TAKE 1 CAPSULE BY MOUTH TWICE DAILY 04/03/15   Setzer, Terri L, NP  oxyCODONE (OXY IR/ROXICODONE) 5 MG immediate release tablet Take 5 mg by mouth 2 (two) times daily as needed. For pain 08/26/14   [provider]  potassium chloride (K-DUR) 10 MEQ tablet Take 1 tablet (10 mEq total) by mouth daily. 09/21/14   Dyann Kief, PA-C  Probiotic Product (PROBIOTIC ADVANCED PO) Take 1 capsule by mouth daily.    [provider]  QUEtiapine (SEROQUEL) 100 MG tablet Take 100 mg by mouth at bedtime.    [provider]  warfarin (COUMADIN) 5 MG tablet See written post operative Coumadin instructions. Patient taking differently: Take 2.5-5 mg by mouth every morning. 12Alternate taking  with 2.5mg  daily 12/26/11   Josephine Igo, MD   Physical Exam: Vitals:   10/23/16 0200 10/23/16 0230 10/23/16 0330 10/23/16 0336  BP: 138/63 (!) 161/67 (!) 162/63 (!) 162/63  Pulse: (!) 41 (!) 40 (!) 40 (!) 41  Resp: (!) 21  Temp:      TempSrc:      SpO2: 95% 95% 93% 97%  Weight:         Wt Readings from Last 3 Encounters:  10/23/16 104.3 kg (230 lb)  06/18/16 107 kg (236 lb)  12/26/15 106.1 kg (234 lb)    General:  Appears calm and comfortable, GCS11 Eyes:  PERRL, EOMI, normal lids, iris ENT:  grossly normal hearing, lips & tongue Neck:  no LAD, masses or thyromegaly Cardiovascular:  RRR, no m/r/g. No LE edema.  Respiratory:  Tachypnea, incr wob, decr air movement, use of accessory mucles Abdomen:  soft, ntnd Skin:  no rash or induration seen on limited exam Musculoskeletal:  grossly normal tone BUE/BLE Psychiatric:  grossly normal mood and affect, speech fluent and appropriate Neurologic:  CN  2-12 grossly intact, moves all extremities in coordinated fashion.          Labs on Admission:  Basic Metabolic Panel:  Recent Labs Lab 10/23/16 0053 10/23/16 0105  NA 134* 138  K 3.9 4.0  CL 100* 102  CO2 23  --   GLUCOSE 132* 131*  BUN 25* 27*  CREATININE 1.08* 1.10*  CALCIUM 9.1  --    Liver Function Tests:  Recent Labs Lab 10/23/16 0053  AST 21  ALT 19  ALKPHOS 60  BILITOT 0.5  PROT 7.7  ALBUMIN 3.3*   No results for input(s): LIPASE, AMYLASE in the last 168 hours. No results for input(s): AMMONIA in the last 168 hours. CBC:  Recent Labs Lab 10/23/16 0053 10/23/16 0105  WBC 10.2  --   NEUTROABS 6.2  --   HGB 13.1 13.3  HCT 38.6 39.0  MCV 85.0  --   PLT 355  --    Cardiac Enzymes:  Recent Labs Lab 10/23/16 0053  TROPONINI <0.03    BNP (last 3 results) No results for input(s): BNP in the last 8760 hours.  ProBNP (last 3 results) No results for input(s): PROBNP in the last 8760 hours.   Serum creatinine: 1.1 mg/dL (H) 16/10/96 0454 Estimated creatinine clearance: 52.3 mL/min (A)  CBG:  Recent Labs Lab 10/23/16 0128  GLUCAP 117*    Radiological Exams on Admission: Ct Head Wo Contrast  Result Date: 10/23/2016 CLINICAL DATA:  Altered level of consciousness.  Fall tonight. EXAM: CT HEAD WITHOUT CONTRAST CT CERVICAL  SPINE WITHOUT CONTRAST TECHNIQUE: Multidetector CT imaging of the head and cervical spine was performed following the standard protocol without intravenous contrast. Multiplanar CT image reconstructions of the cervical spine were also generated. COMPARISON:  CT head 12/14/2005 FINDINGS: CT HEAD FINDINGS Brain: Diffuse cerebral atrophy. Mild ventricular dilatation consistent with central atrophy. Low-attenuation changes in the deep white matter consistent with small vessel ischemia. No mass effect or midline shift. No abnormal extra-axial fluid collections. Gray-white matter junctions are distinct. Basal cisterns are not effaced. No acute intracranial hemorrhage. Vascular: Vascular calcifications in the internal carotid arteries. Skull: No depressed skull fractures. Sinuses/Orbits: Paranasal sinuses and mastoid air cells are clear. Other: None. CT CERVICAL SPINE FINDINGS Alignment: Examination is limited due to motion artifact. Normal alignment of the cervical vertebrae and facet joints. C1-2 articulation appears intact. Skull base and vertebrae: No acute fracture. No primary bone lesion or focal pathologic process. Soft tissues and spinal canal: No prevertebral fluid or swelling. No visible canal hematoma. Disc levels: Degenerative changes throughout the cervical spine with narrowed interspaces and endplate hypertrophic changes. Degenerative changes throughout the cervical facet joints. Upper chest: Prominent nodular enlargement of the thyroid gland with left thyroid measuring 4.5 x 3.3 cm diameter. Other: None. IMPRESSION: 1. No acute intracranial abnormalities. Chronic atrophy and small vessel ischemic changes. 2. Normal alignment of the cervical spine. Diffuse degenerative change. No acute displaced fractures identified. 3. Nodular enlargement of the thyroid gland particularly on the left. Suggest follow-up with ultrasound in the elective setting after resolution of acute process. Electronically Signed   By:  Burman Nieves M.D.   On: 10/23/2016 02:54   Ct Cervical Spine Wo Contrast  Result Date: 10/23/2016 CLINICAL DATA:  Altered level of consciousness.  Fall tonight. EXAM: CT HEAD WITHOUT CONTRAST CT CERVICAL SPINE WITHOUT CONTRAST TECHNIQUE: Multidetector CT imaging of the head and cervical spine was performed following the standard protocol without intravenous contrast. Multiplanar CT image reconstructions of the cervical  spine were also generated. COMPARISON:  CT head 12/14/2005 FINDINGS: CT HEAD FINDINGS Brain: Diffuse cerebral atrophy. Mild ventricular dilatation consistent with central atrophy. Low-attenuation changes in the deep white matter consistent with small vessel ischemia. No mass effect or midline shift. No abnormal extra-axial fluid collections. Gray-white matter junctions are distinct. Basal cisterns are not effaced. No acute intracranial hemorrhage. Vascular: Vascular calcifications in the internal carotid arteries. Skull: No depressed skull fractures. Sinuses/Orbits: Paranasal sinuses and mastoid air cells are clear. Other: None. CT CERVICAL SPINE FINDINGS Alignment: Examination is limited due to motion artifact. Normal alignment of the cervical vertebrae and facet joints. C1-2 articulation appears intact. Skull base and vertebrae: No acute fracture. No primary bone lesion or focal pathologic process. Soft tissues and spinal canal: No prevertebral fluid or swelling. No visible canal hematoma. Disc levels: Degenerative changes throughout the cervical spine with narrowed interspaces and endplate hypertrophic changes. Degenerative changes throughout the cervical facet joints. Upper chest: Prominent nodular enlargement of the thyroid gland with left thyroid measuring 4.5 x 3.3 cm diameter. Other: None. IMPRESSION: 1. No acute intracranial abnormalities. Chronic atrophy and small vessel ischemic changes. 2. Normal alignment of the cervical spine. Diffuse degenerative change. No acute displaced  fractures identified. 3. Nodular enlargement of the thyroid gland particularly on the left. Suggest follow-up with ultrasound in the elective setting after resolution of acute process. Electronically Signed   By: Burman Nieves M.D.   On: 10/23/2016 02:54   Dg Chest Port 1 View  Result Date: 10/23/2016 CLINICAL DATA:  Generalize weakness and fall tonight. History of CHF, diabetes, hypertension, shortness of breath, former smoker. EXAM: PORTABLE CHEST 1 VIEW COMPARISON:  02/22/2015 FINDINGS: Shallow inspiration. Cardiac enlargement with pulmonary vascular congestion. Diffuse interstitial pattern to the lungs likely represents edema. No blunting of costophrenic angles. No pneumothorax. Calcified and tortuous aorta. Probable esophageal hiatal hernia behind the heart. IMPRESSION: Cardiac enlargement with pulmonary vascular congestion and bilateral interstitial edema. Electronically Signed   By: Burman Nieves M.D.   On: 10/23/2016 01:21    EKG: Independently reviewed. No STEMI.  Assessment/Plan Active Problems:   Pulmonary edema  Pulm edema Prn bipap Lasix bid Prn albuterol Neg trop, recheck Start lisinopril  Bradycardia Case reviewed with cardio by edp Advised monitoring  Will hold AV nodal blockers  DM SSI AC Hold Tradjenta, metformin  GERD  PPI IV  Catoplexy Cont anafranil Cont cymbalta, remeron  Anxiety Hold klonopin  Code Status: FULL  DVT Prophylaxis: warfarin Family Communication: grand daughter at bedside Disposition Plan: Pending Improvement  Status: sdu inpt  Haydee Salter, MD Family Medicine [facility] [email or web address] Password [email or web address]

## 2016-10-23 NOTE — ED Notes (Signed)
[name] (Daughter) [phone].

## 2016-10-23 NOTE — Progress Notes (Signed)
Respiratory Care Note: While in with the patient doing her BIPAP check, I was explaining the possible dangers of keeping her dentures in while on BIPAP. I explained that it was our protocol to remove them while on the BIPAP. The patient stated "i'd rather not." The family member at the bedside stated "she has had dentures since she was a teenager has really bad jaw pain with not in to keep the pressure off of jaw." I told her her that I would be following up with her nurse and document our conversation. RN aware.

## 2016-10-23 NOTE — Progress Notes (Signed)
Echocardiogram 2D Echocardiogram has been performed.  Rebecca [name] 10/23/2016, 3:35 PM

## 2016-10-23 NOTE — Progress Notes (Signed)
ANTICOAGULATION CONSULT NOTE  Pharmacy Consult for Warfarin (home med) Indication: DVT (Chronic)  Allergies  Allergen Reactions  . Codeine Anxiety  . Compazine Anaphylaxis  . Other Anaphylaxis, Rash and Other (See Comments)    Uncoded Allergy. Allergen: INOVAR Uncoded Allergy. Allergen: ALL ADHESIVES Uncoded Allergy. Allergen: SILK SUTURE Uncoded Allergy. Allergen: merthiolate Thermasol  . Penicillins Shortness Of Breath and Rash    Has patient had a PCN reaction causing immediate rash, facial/tongue/throat swelling, SOB or lightheadedness with hypotension: Yes Has patient had a PCN reaction causing severe rash involving mucus membranes or skin necrosis: No Has patient had a PCN reaction that required hospitalization No Has patient had a PCN reaction occurring within the last 10 years: Yes If all of the above answers are "NO", then may proceed with Cephalosporin use.   Marland Kitchen Morphine And Related Other (See Comments)    Patient states that it makes her lose her state of mind.   . Demerol [Meperidine] Rash    Patient Measurements: Weight: 230 lb (104.3 kg) Heparin Dosing Weight:   Vital Signs: Temp: 97.8 F (36.6 C) (09/19 0125) BP: 169/53 (09/19 1100) Pulse Rate: 40 (09/19 1102)  Labs:  Recent Labs  10/23/16 0053 10/23/16 0105 10/23/16 0501  HGB 13.1 13.3  --   HCT 38.6 39.0  --   PLT 355  --   --   APTT 64*  --   --   LABPROT 22.2*  --   --   INR 1.96  --   --   CREATININE 1.08* 1.10* 1.11*  TROPONINI <0.03  --  <0.03    Estimated Creatinine Clearance: 51.8 mL/min (A) (by C-G formula based on SCr of 1.11 mg/dL (H)).   Medical History: Past Medical History:  Diagnosis Date  . Cataplexy   . CHF (congestive heart failure) (HCC)   . Diabetes mellitus   . DVT (deep venous thrombosis) (HCC)    multiple  . GERD (gastroesophageal reflux disease)   . Hiatal hernia   . Hypertension   . Narcolepsy   . Neuropathy   . Shortness of breath     Medications:    (Not in a hospital admission)  Assessment: Okay for Protocol.  No bleeding noted.  Head CT (-) acute changes.  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  Goal of Therapy:  INR 2-3   Plan:  Warfarin 5mg  PO x1 Daily PT/INR Monitor for signs and symptoms of bleeding.  Lamonte Richer R 10/23/2016,1:18 PM

## 2016-10-23 NOTE — ED Notes (Signed)
Pt taken off of bipap and placed on 4L nasal cannula.  Pt tolerating well.

## 2016-10-23 NOTE — ED Notes (Signed)
Pt given lunch tray.  Denies any needs.  Family remains at bedside.

## 2016-10-23 NOTE — Progress Notes (Signed)
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 She was given vigorous diuresis still has systolic hypertension and her Cardizem was stopped due to bradycardia sinus bradycardia and Norvasc 5 mg instituted today. A 2-D echo is ordered to assess current ventricular function and a cardiology consult is requested Rebecca [name] WUJ:[id number] DOB: 10-23-1940 DOA: 10/23/2016 PCP: [name], MD   Physical Exam: Blood pressure 130/60, pulse (!) 42, temperature 97.8 F (36.6 C), resp. rate 14, weight 104.3 kg (230 lb), SpO2 100 %. Lungs show diminished breath sounds in the bases prolonged expiratory phase no rales audible no wheezes audible heart regular rhythm no S3 or S4 no heaves thrills rubs   Investigations:  No results found for this or any previous visit (from the past 240 hour(s)).   Basic Metabolic Panel:  Recent Labs  95/62/13 0053 10/23/16 0105 10/23/16 0501  NA 134* 138 136  K 3.9 4.0 4.0  CL 100* 102 103  CO2 23  --  23  GLUCOSE 132* 131* 135*  BUN 25* 27* 26*  CREATININE 1.08* 1.10* 1.11*  CALCIUM 9.1  --  9.0   Liver Function Tests:  Recent Labs  10/23/16 0053  AST 21  ALT 19  ALKPHOS 60  BILITOT 0.5  PROT 7.7  ALBUMIN 3.3*     CBC:  Recent Labs  10/23/16 0053 10/23/16 0105  WBC 10.2  --   NEUTROABS 6.2  --   HGB 13.1 13.3  HCT 38.6 39.0  MCV 85.0  --   PLT 355  --     Ct Head Wo Contrast  Result Date: 10/23/2016 CLINICAL DATA:  Altered level of consciousness.  Fall tonight. EXAM: CT HEAD WITHOUT CONTRAST CT CERVICAL SPINE WITHOUT CONTRAST TECHNIQUE: Multidetector CT imaging of the head and cervical spine was performed following the standard protocol without intravenous contrast. Multiplanar CT image reconstructions of the cervical spine were also generated. COMPARISON:  CT head 12/14/2005 FINDINGS: CT HEAD  FINDINGS Brain: Diffuse cerebral atrophy. Mild ventricular dilatation consistent with central atrophy. Low-attenuation changes in the deep white matter consistent with small vessel ischemia. No mass effect or midline shift. No abnormal extra-axial fluid collections. Gray-white matter junctions are distinct. Basal cisterns are not effaced. No acute intracranial hemorrhage. Vascular: Vascular calcifications in the internal carotid arteries. Skull: No depressed skull fractures. Sinuses/Orbits: Paranasal sinuses and mastoid air cells are clear. Other: None. CT CERVICAL SPINE FINDINGS Alignment: Examination is limited due to motion artifact. Normal alignment of the cervical vertebrae and facet joints. C1-2 articulation appears intact. Skull base and vertebrae: No acute fracture. No primary bone lesion or focal pathologic process. Soft tissues and spinal canal: No prevertebral fluid or swelling. No visible canal hematoma. Disc levels: Degenerative changes throughout the cervical spine with narrowed interspaces and endplate hypertrophic changes. Degenerative changes throughout the cervical facet joints. Upper chest: Prominent nodular enlargement of the thyroid gland with left thyroid measuring 4.5 x 3.3 cm diameter. Other: None. IMPRESSION: 1. No acute intracranial abnormalities. Chronic atrophy and small vessel ischemic changes. 2. Normal alignment of the cervical spine. Diffuse degenerative change. No acute displaced fractures identified. 3. Nodular enlargement of the thyroid gland particularly on the left. Suggest follow-up with ultrasound in the elective setting after resolution of acute process. Electronically Signed   By: Burman Nieves M.D.   On: 10/23/2016 02:54   Ct Cervical Spine Wo Contrast  Result Date:  10/23/2016 CLINICAL DATA:  Altered level of consciousness.  Fall tonight. EXAM: CT HEAD WITHOUT CONTRAST CT CERVICAL SPINE WITHOUT CONTRAST TECHNIQUE: Multidetector CT imaging of the head and cervical  spine was performed following the standard protocol without intravenous contrast. Multiplanar CT image reconstructions of the cervical spine were also generated. COMPARISON:  CT head 12/14/2005 FINDINGS: CT HEAD FINDINGS Brain: Diffuse cerebral atrophy. Mild ventricular dilatation consistent with central atrophy. Low-attenuation changes in the deep white matter consistent with small vessel ischemia. No mass effect or midline shift. No abnormal extra-axial fluid collections. Gray-white matter junctions are distinct. Basal cisterns are not effaced. No acute intracranial hemorrhage. Vascular: Vascular calcifications in the internal carotid arteries. Skull: No depressed skull fractures. Sinuses/Orbits: Paranasal sinuses and mastoid air cells are clear. Other: None. CT CERVICAL SPINE FINDINGS Alignment: Examination is limited due to motion artifact. Normal alignment of the cervical vertebrae and facet joints. C1-2 articulation appears intact. Skull base and vertebrae: No acute fracture. No primary bone lesion or focal pathologic process. Soft tissues and spinal canal: No prevertebral fluid or swelling. No visible canal hematoma. Disc levels: Degenerative changes throughout the cervical spine with narrowed interspaces and endplate hypertrophic changes. Degenerative changes throughout the cervical facet joints. Upper chest: Prominent nodular enlargement of the thyroid gland with left thyroid measuring 4.5 x 3.3 cm diameter. Other: None. IMPRESSION: 1. No acute intracranial abnormalities. Chronic atrophy and small vessel ischemic changes. 2. Normal alignment of the cervical spine. Diffuse degenerative change. No acute displaced fractures identified. 3. Nodular enlargement of the thyroid gland particularly on the left. Suggest follow-up with ultrasound in the elective setting after resolution of acute process. Electronically Signed   By: Burman Nieves M.D.   On: 10/23/2016 02:54   Dg Chest Port 1 View  Result Date:  10/23/2016 CLINICAL DATA:  Generalize weakness and fall tonight. History of CHF, diabetes, hypertension, shortness of breath, former smoker. EXAM: PORTABLE CHEST 1 VIEW COMPARISON:  02/22/2015 FINDINGS: Shallow inspiration. Cardiac enlargement with pulmonary vascular congestion. Diffuse interstitial pattern to the lungs likely represents edema. No blunting of costophrenic angles. No pneumothorax. Calcified and tortuous aorta. Probable esophageal hiatal hernia behind the heart. IMPRESSION: Cardiac enlargement with pulmonary vascular congestion and bilateral interstitial edema. Electronically Signed   By: Burman Nieves M.D.   On: 10/23/2016 01:21      Medications:   Impression:  Active Problems:   Pulmonary edema     Plan: Add Norvasc 5 mg per day. Continue lisinopril 5 mg by mouth daily. Continue to hold Cardizem. 2-D echo for current ventricular function and chamber dimensions. Cardiology consultation requested   Consultants: Cardiology requested    Procedures   Antibiotics:            Time spent: 30 minutes   LOS: 0 days   Freddy Kinne M   10/23/2016, 1:47 PM

## 2016-10-23 NOTE — ED Provider Notes (Signed)
[facility]-EM[facility]ENCY DEPT Provider Note   CSN: [id number] Arrival date & time: 10/23/16  0028     History   Chief Complaint Chief Complaint  Patient presents with  . Weakness   LEVEL 5 CAVEAT DUE TO ACUITY OF CONDITION   HPI Rebecca [name] is a 76 y.o. female.  The history is provided by the patient. The history is limited by the condition of the patient.  Weakness  This is a new problem. The current episode started more than 2 days ago. The problem has been rapidly worsening. There was no focality noted. There has been no fever. Associated symptoms include confusion.  patient presents from home for generalized weakness Apparently she has had increased falls and feeling weak This has been present for past several days Pt reports diffuse pain and weakness, no other complaints No other details are known at arrival   Past Medical History:  Diagnosis Date  . Cataplexy   . CHF (congestive heart failure) (HCC)   . Diabetes mellitus   . DVT (deep venous thrombosis) (HCC)    multiple  . GERD (gastroesophageal reflux disease)   . Hiatal hernia   . Hypertension   . Narcolepsy   . Neuropathy   . Shortness of breath     Patient Active Problem List   Diagnosis Date Noted  . Acute respiratory failure with hypoxia (HCC) 02/22/2015  . CAP (community acquired pneumonia) 02/22/2015  . Fall at home 09/21/2014  . Mild aortic stenosis 09/08/2014  . Mild to moderate  mitral stenosis 09/08/2014  . Chronic anticoagulation-Coumadin 09/08/2014  . Sepsis (HCC) 09/05/2014  . Lower leg DVT (deep venous thromboembolism), chronic (HCC) 09/05/2014  . Essential hypertension 09/05/2014  . Depression 09/05/2014  . Poorly controlled diabetes mellitus (HCC) 09/05/2014  . Hypomagnesemia 09/05/2014  . Hyponatremia 09/05/2014  . Pyelonephritis 09/05/2014  . Chronic diastolic CHF (congestive heart failure) (HCC)   . 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 GERD (gastroesophageal  reflux disease) 07/08/2011  . Gastroparesis 07/08/2011  . IDA (iron deficiency anemia) 07/08/2011  . History of colonic polyps 07/08/2011  . 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 NAFLD (nonalcoholic fatty liver disease) 83/15/1761  . Obesity-BMI 37 07/08/2011    Past Surgical History:  Procedure Laterality Date  . ABDOMINAL HYSTERECTOMY    . CARPAL TUNNEL RELEASE    . CARPECTOMY  12/26/2011   Procedure: CARPECTOMY;  Surgeon: Wyn Forster., MD;  Location: [facility];  Service: Orthopedics;  Laterality: Right;  PROXIMAL ROW CARPECTOMY RIGHT WRIST and neurectomy  . CHOLECYSTECTOMY    . HEMORROIDECTOMY    . KIDNEY SURGERY    . NEPHROSTOMY    . TOTAL HIP ARTHROPLASTY     bilateral    OB History    No data available       Home Medications    Prior to Admission medications   Medication Sig Start Date End Date Taking? Authorizing Provider  Ascorbic Acid (VITAMIN C WITH ROSE HIPS) 500 MG tablet Take 500 mg by mouth daily.    [provider]  BYDUREON 2 MG PEN Inject 2 mg into the skin once a week. 01/03/16   [provider]  celecoxib (CELEBREX) 200 MG capsule Take 200 mg by mouth daily. For arthritis    [provider]  Cholecalciferol (VITAMIN D3) 3000 UNITS TABS Take 1 capsule by mouth daily.    [provider]  clomiPRAMINE (ANAFRANIL) 25 MG capsule Take 25 mg by mouth 4 (  four) times daily.     [provider]  clonazePAM (KLONOPIN) 2 MG tablet Take 2 mg by mouth at bedtime.    [provider]  Coenzyme Q10 (CO Q10) 200 MG CAPS Take 1 capsule by mouth daily.    [provider]  diltiazem (CARDIZEM CD) 180 MG 24 hr capsule Take 2 capsules (360 mg total) by mouth daily. 10/30/10   [name], MD  DULoxetine (CYMBALTA) 60 MG capsule Take 60 mg by mouth daily.      [provider]  Ferrous Fumarate (FERROCITE) 324 (106 FE) MG TABS Take 1 capsule by mouth 3 (three) times a week.     [provider]  furosemide (LASIX) 40 MG tablet Take 20 mg by mouth 2 (two) times daily.  12/01/14   [provider]  Hyaluronic Acid-Vitamin C (HYALURONIC ACID PO) Take 100 mg by mouth daily.    [provider]  insulin glargine (LANTUS) 100 unit/mL SOPN Inject 24 Units into the skin at bedtime.    [provider]  linagliptin (TRADJENTA) 5 MG TABS tablet Take 5 mg by mouth daily.    [provider]  Magnesium 300 MG CAPS Take 600 mg by mouth daily.    [provider]  metFORMIN (GLUCOPHAGE) 500 MG tablet Take 500 mg by mouth 2 (two) times daily with a meal.      [provider]  mirtazapine (REMERON) 15 MG tablet Take 15 mg by mouth at bedtime.      [provider]  omeprazole (PRILOSEC) 20 MG capsule TAKE 1 CAPSULE BY MOUTH TWICE DAILY 04/03/15   Setzer, Terri L, NP  oxyCODONE (OXY IR/ROXICODONE) 5 MG immediate release tablet Take 5 mg by mouth 2 (two) times daily as needed. For pain 08/26/14   [provider]  potassium chloride (K-DUR) 10 MEQ tablet Take 1 tablet (10 mEq total) by mouth daily. 09/21/14   Dyann Kief, PA-C  Probiotic Product (PROBIOTIC ADVANCED PO) Take 1 capsule by mouth daily.    [provider]  QUEtiapine (SEROQUEL) 100 MG tablet Take 100 mg by mouth at bedtime.    [provider]  warfarin (COUMADIN) 5 MG tablet See written post operative Coumadin instructions. Patient taking differently: Take 2.5-5 mg by mouth every morning. 12Alternate taking  with 2.5mg  daily 12/26/11   Sypher, Molly Maduro, MD    Family History Family History  Problem Relation Age of Onset  . Heart disease Father   . Diabetes Unknown   . Stroke Mother   . Scleroderma Brother     Social History Social History  Substance Use Topics  . Smoking status: Former Smoker    Quit date: 10/13/1988  . Smokeless tobacco: Never Used  . Alcohol use No     Allergies   Codeine; Compazine; Other;  Penicillins; Morphine and related; and Demerol [meperidine]   Review of Systems Review of Systems  Unable to perform ROS: Acuity of condition  Neurological: Positive for weakness.  Psychiatric/Behavioral: Positive for confusion.     Physical Exam Updated Vital Signs BP (!) 151/52   Pulse (!) 42   Temp (!) 97.5 F (36.4 C) (Rectal)   Resp 20   Wt 104.3 kg (230 lb)   SpO2 90%   BMI 37.12 kg/m   Physical Exam CONSTITUTIONAL: elderly, ill appearing HEAD: Normocephalic/atraumatic, no signs of trauma EYES: EOMI/PERRL ENMT: Mucous membranes moist NECK: supple no meningeal signs SPINE/BACK:entire spine nontender, No bruising/crepitance/stepoffs noted to spine CV: bradycardic, murmur  noted LUNGS: Lungs are clear to auscultation bilaterally, no apparent distress ABDOMEN: soft, nontender  GU:no cva tenderness NEURO: Pt is awake/alert, she is slow to respond but answers questions appropriately.  No focal arm/leg weakness.  No facial droop EXTREMITIES: pulses normal/equal, full ROM, All other extremities/joints palpated/ranged and nontender SKIN: warm, color normal PSYCH: unable to assess   ED Treatments / Results  Labs (all labs ordered are listed, but only abnormal results are displayed) Labs Reviewed  PROTIME-INR - Abnormal; Notable for the following:       Result Value   Prothrombin Time 22.2 (*)    All other components within normal limits  APTT - Abnormal; Notable for the following:    aPTT 64 (*)    All other components within normal limits  DIFFERENTIAL - Abnormal; Notable for the following:    Monocytes Absolute 1.2 (*)    Eosinophils Absolute 0.9 (*)    All other components within normal limits  COMPREHENSIVE METABOLIC PANEL - Abnormal; Notable for the following:    Sodium 134 (*)    Chloride 100 (*)    Glucose, Bld 132 (*)    BUN 25 (*)    Creatinine, Ser 1.08 (*)    Albumin 3.3 (*)    GFR calc non Af Amer 48 (*)    GFR calc Af Amer 56 (*)    All other  components within normal limits  RAPID URINE DRUG SCREEN, HOSP PERFORMED - Abnormal; Notable for the following:    Benzodiazepines POSITIVE (*)    All other components within normal limits  URINALYSIS, ROUTINE W REFLEX MICROSCOPIC - Abnormal; Notable for the following:    Color, Urine AMBER (*)    APPearance CLOUDY (*)    Protein, ur 100 (*)    Bacteria, UA RARE (*)    Squamous Epithelial / LPF 0-5 (*)    All other components within normal limits  ACETAMINOPHEN LEVEL - Abnormal; Notable for the following:    Acetaminophen (Tylenol), Serum <10 (*)    All other components within normal limits  BLOOD GAS, ARTERIAL - Abnormal; Notable for the following:    pO2, Arterial 75.5 (*)    All other components within normal limits  I-STAT CHEM 8, ED - Abnormal; Notable for the following:    BUN 27 (*)    Creatinine, Ser 1.10 (*)    Glucose, Bld 131 (*)    All other components within normal limits  CBG MONITORING, ED - Abnormal; Notable for the following:    Glucose-Capillary 117 (*)    All other components within normal limits  ETHANOL  CBC  TROPONIN I  SALICYLATE LEVEL  SODIUM, URINE, RANDOM  I-STAT TROPONIN, ED  I-STAT CG4 LACTIC ACID, ED    EKG  EKG Interpretation  Date/Time:  Wednesday October 23 2016 00:41:58 EDT Ventricular Rate:  42 PR Interval:    QRS Duration: 131 QT Interval:  539 QTC Calculation: 451 R Axis:   166 Text Interpretation:  Junctional rhythm RBBB and LPFB Abnormal ekg Confirmed by Zadie Rhine ([id number]) on 10/23/2016 12:51:04 AM       EKG Interpretation  Date/Time:  Wednesday October 23 2016 01:22:15 EDT Ventricular Rate:  41 PR Interval:    QRS Duration: 131 QT Interval:  592 QTC Calculation: 489 R Axis:   -165 Text Interpretation:  Second degree heart block Short PR interval RBBB and LPFB Nonspecific T abnormalities, lateral leads Confirmed by Zadie Rhine ([id number]) on 10/23/2016 1:28:03 AM  Radiology Ct Head Wo  Contrast  Result Date: 10/23/2016 CLINICAL DATA:  Altered level of consciousness.  Fall tonight. EXAM: CT HEAD WITHOUT CONTRAST CT CERVICAL SPINE WITHOUT CONTRAST TECHNIQUE: Multidetector CT imaging of the head and cervical spine was performed following the standard protocol without intravenous contrast. Multiplanar CT image reconstructions of the cervical spine were also generated. COMPARISON:  CT head 12/14/2005 FINDINGS: CT HEAD FINDINGS Brain: Diffuse cerebral atrophy. Mild ventricular dilatation consistent with central atrophy. Low-attenuation changes in the deep white matter consistent with small vessel ischemia. No mass effect or midline shift. No abnormal extra-axial fluid collections. Gray-white matter junctions are distinct. Basal cisterns are not effaced. No acute intracranial hemorrhage. Vascular: Vascular calcifications in the internal carotid arteries. Skull: No depressed skull fractures. Sinuses/Orbits: Paranasal sinuses and mastoid air cells are clear. Other: None. CT CERVICAL SPINE FINDINGS Alignment: Examination is limited due to motion artifact. Normal alignment of the cervical vertebrae and facet joints. C1-2 articulation appears intact. Skull base and vertebrae: No acute fracture. No primary bone lesion or focal pathologic process. Soft tissues and spinal canal: No prevertebral fluid or swelling. No visible canal hematoma. Disc levels: Degenerative changes throughout the cervical spine with narrowed interspaces and endplate hypertrophic changes. Degenerative changes throughout the cervical facet joints. Upper chest: Prominent nodular enlargement of the thyroid gland with left thyroid measuring 4.5 x 3.3 cm diameter. Other: None. IMPRESSION: 1. No acute intracranial abnormalities. Chronic atrophy and small vessel ischemic changes. 2. Normal alignment of the cervical spine. Diffuse degenerative change. No acute displaced fractures identified. 3. Nodular enlargement of the thyroid gland  particularly on the left. Suggest follow-up with ultrasound in the elective setting after resolution of acute process. Electronically Signed   By: Burman Nieves M.D.   On: 10/23/2016 02:54   Ct Cervical Spine Wo Contrast  Result Date: 10/23/2016 CLINICAL DATA:  Altered level of consciousness.  Fall tonight. EXAM: CT HEAD WITHOUT CONTRAST CT CERVICAL SPINE WITHOUT CONTRAST TECHNIQUE: Multidetector CT imaging of the head and cervical spine was performed following the standard protocol without intravenous contrast. Multiplanar CT image reconstructions of the cervical spine were also generated. COMPARISON:  CT head 12/14/2005 FINDINGS: CT HEAD FINDINGS Brain: Diffuse cerebral atrophy. Mild ventricular dilatation consistent with central atrophy. Low-attenuation changes in the deep white matter consistent with small vessel ischemia. No mass effect or midline shift. No abnormal extra-axial fluid collections. Gray-white matter junctions are distinct. Basal cisterns are not effaced. No acute intracranial hemorrhage. Vascular: Vascular calcifications in the internal carotid arteries. Skull: No depressed skull fractures. Sinuses/Orbits: Paranasal sinuses and mastoid air cells are clear. Other: None. CT CERVICAL SPINE FINDINGS Alignment: Examination is limited due to motion artifact. Normal alignment of the cervical vertebrae and facet joints. C1-2 articulation appears intact. Skull base and vertebrae: No acute fracture. No primary bone lesion or focal pathologic process. Soft tissues and spinal canal: No prevertebral fluid or swelling. No visible canal hematoma. Disc levels: Degenerative changes throughout the cervical spine with narrowed interspaces and endplate hypertrophic changes. Degenerative changes throughout the cervical facet joints. Upper chest: Prominent nodular enlargement of the thyroid gland with left thyroid measuring 4.5 x 3.3 cm diameter. Other: None. IMPRESSION: 1. No acute intracranial abnormalities.  Chronic atrophy and small vessel ischemic changes. 2. Normal alignment of the cervical spine. Diffuse degenerative change. No acute displaced fractures identified. 3. Nodular enlargement of the thyroid gland particularly on the left. Suggest follow-up with ultrasound in the elective setting after resolution of acute process. Electronically Signed   By:  Burman Nieves M.D.   On: 10/23/2016 02:54   Dg Chest Port 1 View  Result Date: 10/23/2016 CLINICAL DATA:  Generalize weakness and fall tonight. History of CHF, diabetes, hypertension, shortness of breath, former smoker. EXAM: PORTABLE CHEST 1 VIEW COMPARISON:  02/22/2015 FINDINGS: Shallow inspiration. Cardiac enlargement with pulmonary vascular congestion. Diffuse interstitial pattern to the lungs likely represents edema. No blunting of costophrenic angles. No pneumothorax. Calcified and tortuous aorta. Probable esophageal hiatal hernia behind the heart. IMPRESSION: Cardiac enlargement with pulmonary vascular congestion and bilateral interstitial edema. Electronically Signed   By: Burman Nieves M.D.   On: 10/23/2016 01:21    Procedures Procedures  CRITICAL CARE Performed by: Joya Gaskins Total critical care time: 45 minutes Critical care time was exclusive of separately billable procedures and treating other patients. Critical care was necessary to treat or prevent imminent or life-threatening deterioration. Critical care was time spent personally by me on the following activities: development of treatment plan with patient and/or surrogate as well as nursing, discussions with consultants, evaluation of patient's response to treatment, examination of patient, obtaining history from patient or surrogate, ordering and performing treatments and interventions, ordering and review of laboratory studies, ordering and review of radiographic studies, pulse oximetry and re-evaluation of patient's condition. PATIENT WITH HEART BLOCK AND ALTERED MENTAL  STATUS REQUIRING ADMISSION/MONITORING   Medications Ordered in ED Medications  furosemide (LASIX) injection 60 mg (60 mg Intravenous Given 10/23/16 0340)     Initial Impression / Assessment and Plan / ED Course  I have reviewed the triage vital signs and the nursing notes.  Pertinent labs & imaging results that were available during my care of the patient were reviewed by me and considered in my medical decision making (see chart for details).     1:31 AM Pt seen soon after arrival for weakness She is bradycardic  Blood pressure appropriate Potassium normal She appears to have heart block Consulting cardiology Will defer pacing as HR in the 40s, BP stable and she is awake/alert 1:43 AM D/w dr Shirlee Latch with cardiology He has reviewed EKG and medications Plan: 1. Stop diltiazem 2. Continue to monitior/admit at [facility] 3. No specific med recs 4. Cardiology consult in AM 5. Transfer if becomes unstable 6. Probable 2nd degree block 2:25 AM Pt drowsy but arousable Apparently she took her sleeping meds just prior to arrival Will get CT head/cspine due to reported falls and she is on coumadin 3:50 AM Pt stable Sleepy but arousable Persistent bradycardia 40s BP is appropriate CT head negative Will admit D/w dr hobbs for admission  Pt with heart block causing weakness/falls, will need monitoring in ICU  Final Clinical Impressions(s) / ED Diagnoses   Final diagnoses:  Heart block  Acute pulmonary edema (HCC)  Delirium    New Prescriptions New Prescriptions   No medications on file     Zadie Rhine, MD 10/23/16 [phone]

## 2016-10-24 ENCOUNTER — Encounter (HOSPITAL_COMMUNITY): Payer: Self-pay | Admitting: Cardiology

## 2016-10-24 DIAGNOSIS — I442 Atrioventricular block, complete: Principal | ICD-10-CM

## 2016-10-24 DIAGNOSIS — I350 Nonrheumatic aortic (valve) stenosis: Secondary | ICD-10-CM

## 2016-10-24 DIAGNOSIS — I342 Nonrheumatic mitral (valve) stenosis: Secondary | ICD-10-CM

## 2016-10-24 DIAGNOSIS — I10 Essential (primary) hypertension: Secondary | ICD-10-CM

## 2016-10-24 DIAGNOSIS — I5032 Chronic diastolic (congestive) heart failure: Secondary | ICD-10-CM

## 2016-10-24 LAB — MRSA PCR SCREENING: MRSA by PCR: NEGATIVE

## 2016-10-24 LAB — PROTIME-INR
INR: 2.4
PROTHROMBIN TIME: 26 s — AB (ref 11.4–15.2)

## 2016-10-24 LAB — BASIC METABOLIC PANEL
ANION GAP: 11 (ref 5–15)
BUN: 32 mg/dL — ABNORMAL HIGH (ref 6–20)
CHLORIDE: 97 mmol/L — AB (ref 101–111)
CO2: 27 mmol/L (ref 22–32)
Calcium: 8.7 mg/dL — ABNORMAL LOW (ref 8.9–10.3)
Creatinine, Ser: 1.81 mg/dL — ABNORMAL HIGH (ref 0.44–1.00)
GFR, EST AFRICAN AMERICAN: 30 mL/min — AB (ref >60)
GFR, EST NON AFRICAN AMERICAN: 26 mL/min — AB (ref >60)
GLUCOSE: 128 mg/dL — AB (ref 65–99)
Potassium: 4 mmol/L (ref 3.5–5.1)
Sodium: 135 mmol/L (ref 135–145)

## 2016-10-24 MED ORDER — WARFARIN SODIUM 2.5 MG PO TABS
2.5000 mg | ORAL_TABLET | Freq: Once | ORAL | Status: AC
  Administered 2016-10-24: 2.5 mg via ORAL
  Filled 2016-10-24: qty 1

## 2016-10-24 MED ORDER — CLONAZEPAM 0.5 MG PO TABS
1.0000 mg | ORAL_TABLET | Freq: Every evening | ORAL | Status: DC | PRN
  Administered 2016-10-24 – 2016-10-25 (×2): 1 mg via ORAL
  Filled 2016-10-24 (×2): qty 2

## 2016-10-24 MED ORDER — CLOMIPHENE CITRATE 50 MG PO TABS
100.0000 mg | ORAL_TABLET | Freq: Every day | ORAL | Status: DC
  Filled 2016-10-24: qty 2

## 2016-10-24 MED ORDER — QUETIAPINE FUMARATE 50 MG PO TABS
100.0000 mg | ORAL_TABLET | Freq: Every day | ORAL | Status: DC
  Administered 2016-10-24 – 2016-10-27 (×4): 100 mg via ORAL
  Filled 2016-10-24 (×3): qty 1
  Filled 2016-10-24: qty 2
  Filled 2016-10-24: qty 1
  Filled 2016-10-24: qty 2
  Filled 2016-10-24: qty 1
  Filled 2016-10-24: qty 2

## 2016-10-24 NOTE — Consult Note (Signed)
Cardiology Consultation:   Patient ID: Rebecca [name]; [id number]; 1940/12/06   Admit date: 10/23/2016 Date of Consult: 10/24/2016  Primary Care Provider: [name], MD Primary Cardiologist: Dr. Prentice Docker    Patient Profile:   Rebecca [name] is a 76 y.o. 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 Janna Arch.  History of Present Illness:   [name] presented to the emergency room with complaints of generalized weakness and recent fall at home. She has family members that assist with her care, lives with her son, granddaughter and her husband are present today. She states that she has had weakness, difficulty getting around her house despite using a walker, also prone to falling. She does not endorse frank syncope however. She has had no chest pain or palpitations. After her fall her son called EMS and she was brought to the emergency room.  On arrival to the emergency room blood pressure 151/52, heart rate 42, O2 sat 90%, she was afebrile. Pertinent labs revealed BUN 26, creatinine 1.11, troponin less than 0.03; INR 1.78. CT scan of the cervical spine and head revealed no acute intracranial abnormalities, however nodular enlargement of the thyroid gland was noted most prominently on the left. Chest x-ray revealed pulmonary vascular congestion and bilateral interstitial edema.  She was treated with IV Lasix 60 mg 1, Anafranil 25 mg by mouth, Cymbalta, Prinivil 5 mg, Coumadin 5 mg, amlodipine 5 mg and aspirin 81 mg. Admitted to ICU for further observation. ER physician spoke with cardiologist on call and was advised to stop AV nodal blocking agents. It was noted on review of home medications with the patient was on diltiazem CD.  Patient's  granddaughter states that Ms. Arvizu has had a slow heart rate in the 40s for the last 2 months potentially.  Past Medical History:  Diagnosis Date  . Aortic stenosis   . Cataplexy   . Diastolic heart failure (HCC)   . Essential hypertension   . GERD (gastroesophageal reflux disease)   . Hiatal hernia   . History of recurrent deep vein thrombosis (DVT)   . Mitral stenosis   . Narcolepsy   . Neuropathy   . Type 2 diabetes mellitus (HCC)     Past Surgical History:  Procedure Laterality Date  . ABDOMINAL HYSTERECTOMY    . CARPAL TUNNEL RELEASE    . CARPECTOMY  12/26/2011   Procedure: CARPECTOMY;  Surgeon: Wyn Forster., MD;  Location: [facility];  Service: Orthopedics;  Laterality: Right;  PROXIMAL ROW CARPECTOMY RIGHT WRIST and neurectomy  . CHOLECYSTECTOMY    . HEMORROIDECTOMY    . KIDNEY SURGERY    . NEPHROSTOMY    . TOTAL HIP ARTHROPLASTY     bilateral     Home Medications:  Prior to Admission medications   Medication Sig Start Date End Date Taking? Authorizing Provider  BYDUREON 2 MG PEN Inject 2 mg into the skin once a week. 01/03/16  Yes [provider]  celecoxib (CELEBREX) 200 MG capsule Take 200 mg by mouth daily. For arthritis   Yes [provider]  Cholecalciferol (VITAMIN D3) 3000 UNITS TABS Take 1 capsule by mouth daily.   Yes [provider]  clomiPRAMINE (ANAFRANIL) 25 MG capsule Take 25 mg by mouth 4 (four) times  daily.    Yes [provider]  clonazePAM (KLONOPIN) 1 MG tablet Take 1 mg by mouth at bedtime.    Yes [provider]  Coenzyme Q10 (CO Q10) 200 MG CAPS Take 1 capsule by mouth daily.   Yes [provider]  diltiazem (CARDIZEM CD) 120 MG 24 hr capsule Take 120 mg by mouth daily.   Yes [provider]  DULoxetine (CYMBALTA) 60 MG capsule Take 60 mg by mouth daily.     Yes [provider]  Ferrous Fumarate (FERROCITE) 324 (106 FE) MG TABS Take 1 capsule by mouth  daily.    Yes [provider]  furosemide (LASIX) 40 MG tablet Take 20 mg by mouth 2 (two) times daily.  12/01/14  Yes [provider]  Hyaluronic Acid-Vitamin C (HYALURONIC ACID PO) Take 100 mg by mouth daily.   Yes [provider]  insulin glargine (LANTUS) 100 unit/mL SOPN Inject 20 Units into the skin at bedtime.    Yes [provider]  Magnesium 300 MG CAPS Take 600 mg by mouth daily.   Yes [provider]  metFORMIN (GLUCOPHAGE) 500 MG tablet Take 500 mg by mouth 2 (two) times daily with a meal.     Yes [provider]  mirtazapine (REMERON) 15 MG tablet Take 15 mg by mouth at bedtime.     Yes [provider]  potassium chloride (K-DUR) 10 MEQ tablet Take 1 tablet (10 mEq total) by mouth daily. 09/21/14  Yes Dyann Kief, PA-C  Probiotic Product (PROBIOTIC ADVANCED PO) Take 1 capsule by mouth daily.   Yes [provider]  QUEtiapine (SEROQUEL) 100 MG tablet Take 100 mg by mouth at bedtime.   Yes [provider]  warfarin (COUMADIN) 5 MG tablet See written post operative Coumadin instructions. Patient taking differently: Take 2.5-5 mg by mouth every morning. Alternate taking  with 2.5mg  daily 12/26/11  Yes Sypher, Molly Maduro, MD    Inpatient Medications: Scheduled Meds: . amLODipine  5 mg Oral Daily  . aspirin EC  81 mg Oral Daily  . clomiPRAMINE  25 mg Oral QID  . DULoxetine  60 mg Oral Daily  . furosemide  60 mg Intravenous BID  . lisinopril  5 mg Oral Daily  . mirtazapine  15 mg Oral QHS  . sodium chloride flush  3 mL Intravenous Q12H  . Warfarin - Pharmacist Dosing Inpatient   Does not apply Q24H   Continuous Infusions: . sodium chloride     PRN Meds: sodium chloride, acetaminophen, albuterol, hydrALAZINE, ondansetron (ZOFRAN) IV, sodium chloride flush  Allergies:    Allergies  Allergen Reactions  . Codeine Anxiety  . Compazine Anaphylaxis  . Other Anaphylaxis, Rash and Other (See  Comments)    Uncoded Allergy. Allergen: INOVAR Uncoded Allergy. Allergen: ALL ADHESIVES Uncoded Allergy. Allergen: SILK SUTURE Uncoded Allergy. Allergen: merthiolate Thermasol  . Penicillins Shortness Of Breath and Rash    Has patient had a PCN reaction causing immediate rash, facial/tongue/throat swelling, SOB or lightheadedness with hypotension: Yes Has patient had a PCN reaction causing severe rash involving mucus membranes or skin necrosis: No Has patient had a PCN reaction that required hospitalization No Has patient had a PCN reaction occurring within the last 10 years: Yes If all of the above answers are "NO", then may proceed with Cephalosporin use.   Marland Kitchen Morphine And Related Other (See Comments)    Patient states that it makes her lose her state of mind.   . Demerol [  Meperidine] Rash    Social History:   Social History   Social History  . Marital status: Widowed    Spouse name: N/A  . Number of children: N/A  . Years of education: 47   Occupational History  .  Retired   Social History Main Topics  . Smoking status: Former Smoker    Quit date: 10/13/1988  . Smokeless tobacco: Never Used  . Alcohol use No  . Drug use: No  . Sexual activity: Not on file   Other Topics Concern  . Not on file   Social History Narrative  . No narrative on file    Family History:    Family History  Problem Relation Age of Onset  . Heart disease Father   . Diabetes Unknown   . Stroke Mother   . Scleroderma Brother      ROS:  Please see the history of present illness.  ROS  Fatigue, chronic leg weakness, uses a walker. Frequent falls. No fevers or chills. No cough or hemoptysis. All other ROS reviewed and negative.     Physical Exam/Data:   Vitals:   10/24/16 0800 10/24/16 0900 10/24/16 1000 10/24/16 1117  BP: (!) 135/52     Pulse: (!) 40 (!) 41 (!) 43 (!) 43  Resp: (!) 21 13 (!) 25 16  Temp:    97.7 F (36.5 C)  TempSrc:    Oral  SpO2: 95% 93% (!) 88% 91%  Weight:       Height:        Intake/Output Summary (Last 24 hours) at 10/24/16 1146 Last data filed at 10/24/16 0908  Gross per 24 hour  Intake              240 ml  Output             1050 ml  Net             -810 ml   Filed Weights   10/23/16 0029 10/23/16 2115 10/24/16 0500  Weight: 230 lb (104.3 kg) 212 lb 15.4 oz (96.6 kg) 212 lb 8.4 oz (96.4 kg)   Body mass index is 31.84 kg/m.   General:  Chronically ill-appearing overweight woman, no acute distress. HEENT: normal Lymph: no adenopathy Neck: Mildly elevated JVP. Endocrine:  No thyromegaly on palpation.   Vascular: No carotid bruits; FA pulses 2+ bilaterally without bruits  Cardiac:  Bradycardic RRR; 2/6 holosystolic murmur and soft diastolic murmur. Lungs:  Respiratory wheezes, scant  Abd: Soft, nontender, no hepatomegaly Obese  Ext: no edema Musculoskeletal:  No deformities, BUE and BLE,  diminished strength, generalized. Multiple varicosities bilaterally  Skin: warm and dry  Neuro:  CNs 2-12 intact, no focal abnormalities noted Psych:  Normal affect   EKG: I personally reviewed the ECG from 10/24/2016 which shows sinus rhythm with complete heart block, right bundle branch block, leftward axis. Heart rate 50.  Telemetry:  Telemetry was personally reviewed and demonstrates sinus rhythm with complete heart block, heart rate mainly in the low 40s.  Relevant CV Studies: Echocardiogram 10/23/2016 Left ventricle: The cavity size was normal. Wall thickness was   increased in a pattern of moderate LVH. Systolic function was   normal. The estimated ejection fraction was in the range of 60%   to 65%. Wall motion was normal; there were no regional wall   motion abnormalities. Doppler parameters are consistent with   abnormal left ventricular relaxation (grade 1 diastolic   dysfunction). - Aortic valve: Severely calcified  annulus. Trileaflet; moderately   thickened leaflets. There was mild stenosis. There was moderate   regurgitation.  Mean gradient (S): 12 mm Hg. Valve area (VTI):   1.65 cm^2. - Mitral valve: Severely calcified annulus. Moderately thickened   leaflets . Mean gradient for mitral stenosis is in the severe   range at 10 mmHg. The MVA by PHT is closer to the moderate range   1.5 cm squared. There was mild regurgitation. Mean gradient (D):   10 mm Hg. - Left atrium: The atrium was severely dilated. - Technically adequate study.  Laboratory Data:  Chemistry  Recent Labs Lab 10/23/16 0053 10/23/16 0105 10/23/16 0501 10/24/16 0535  NA 134* 138 136 135  K 3.9 4.0 4.0 4.0  CL 100* 102 103 97*  CO2 23  --  23 27  GLUCOSE 132* 131* 135* 128*  BUN 25* 27* 26* 32*  CREATININE 1.08* 1.10* 1.11* 1.81*  CALCIUM 9.1  --  9.0 8.7*  GFRNONAA 48*  --  47* 26*  GFRAA 56*  --  54* 30*  ANIONGAP 11  --  10 11     Recent Labs Lab 10/23/16 0053  PROT 7.7  ALBUMIN 3.3*  AST 21  ALT 19  ALKPHOS 60  BILITOT 0.5   Hematology  Recent Labs Lab 10/23/16 0053 10/23/16 0105  WBC 10.2  --   RBC 4.54  --   HGB 13.1 13.3  HCT 38.6 39.0  MCV 85.0  --   MCH 28.9  --   MCHC 33.9  --   RDW 14.1  --   PLT 355  --    Cardiac Enzymes  Recent Labs Lab 10/23/16 0053 10/23/16 0501  TROPONINI <0.03 <0.03     Recent Labs Lab 10/23/16 0103  TROPIPOC 0.01    Radiology/Studies:  Ct Head Wo Contrast  Result Date: 10/23/2016 CLINICAL DATA:  Altered level of consciousness.  Fall tonight. EXAM: CT HEAD WITHOUT CONTRAST CT CERVICAL SPINE WITHOUT CONTRAST TECHNIQUE: Multidetector CT imaging of the head and cervical spine was performed following the standard protocol without intravenous contrast. Multiplanar CT image reconstructions of the cervical spine were also generated. COMPARISON:  CT head 12/14/2005 FINDINGS: CT HEAD FINDINGS Brain: Diffuse cerebral atrophy. Mild ventricular dilatation consistent with central atrophy. Low-attenuation changes in the deep white matter consistent with small vessel ischemia. No  mass effect or midline shift. No abnormal extra-axial fluid collections. Gray-white matter junctions are distinct. Basal cisterns are not effaced. No acute intracranial hemorrhage. Vascular: Vascular calcifications in the internal carotid arteries. Skull: No depressed skull fractures. Sinuses/Orbits: Paranasal sinuses and mastoid air cells are clear. Other: None. CT CERVICAL SPINE FINDINGS Alignment: Examination is limited due to motion artifact. Normal alignment of the cervical vertebrae and facet joints. C1-2 articulation appears intact. Skull base and vertebrae: No acute fracture. No primary bone lesion or focal pathologic process. Soft tissues and spinal canal: No prevertebral fluid or swelling. No visible canal hematoma. Disc levels: Degenerative changes throughout the cervical spine with narrowed interspaces and endplate hypertrophic changes. Degenerative changes throughout the cervical facet joints. Upper chest: Prominent nodular enlargement of the thyroid gland with left thyroid measuring 4.5 x 3.3 cm diameter. Other: None. IMPRESSION: 1. No acute intracranial abnormalities. Chronic atrophy and small vessel ischemic changes. 2. Normal alignment of the cervical spine. Diffuse degenerative change. No acute displaced fractures identified. 3. Nodular enlargement of the thyroid gland particularly on the left. Suggest follow-up with ultrasound in the elective setting after resolution of acute process. Electronically  Signed   By: Burman Nieves M.D.   On: 10/23/2016 02:54   Ct Cervical Spine Wo Contrast  Result Date: 10/23/2016 CLINICAL DATA:  Altered level of consciousness.  Fall tonight. EXAM: CT HEAD WITHOUT CONTRAST CT CERVICAL SPINE WITHOUT CONTRAST TECHNIQUE: Multidetector CT imaging of the head and cervical spine was performed following the standard protocol without intravenous contrast. Multiplanar CT image reconstructions of the cervical spine were also generated. COMPARISON:  CT head 12/14/2005  FINDINGS: CT HEAD FINDINGS Brain: Diffuse cerebral atrophy. Mild ventricular dilatation consistent with central atrophy. Low-attenuation changes in the deep white matter consistent with small vessel ischemia. No mass effect or midline shift. No abnormal extra-axial fluid collections. Gray-white matter junctions are distinct. Basal cisterns are not effaced. No acute intracranial hemorrhage. Vascular: Vascular calcifications in the internal carotid arteries. Skull: No depressed skull fractures. Sinuses/Orbits: Paranasal sinuses and mastoid air cells are clear. Other: None. CT CERVICAL SPINE FINDINGS Alignment: Examination is limited due to motion artifact. Normal alignment of the cervical vertebrae and facet joints. C1-2 articulation appears intact. Skull base and vertebrae: No acute fracture. No primary bone lesion or focal pathologic process. Soft tissues and spinal canal: No prevertebral fluid or swelling. No visible canal hematoma. Disc levels: Degenerative changes throughout the cervical spine with narrowed interspaces and endplate hypertrophic changes. Degenerative changes throughout the cervical facet joints. Upper chest: Prominent nodular enlargement of the thyroid gland with left thyroid measuring 4.5 x 3.3 cm diameter. Other: None. IMPRESSION: 1. No acute intracranial abnormalities. Chronic atrophy and small vessel ischemic changes. 2. Normal alignment of the cervical spine. Diffuse degenerative change. No acute displaced fractures identified. 3. Nodular enlargement of the thyroid gland particularly on the left. Suggest follow-up with ultrasound in the elective setting after resolution of acute process. Electronically Signed   By: Burman Nieves M.D.   On: 10/23/2016 02:54   Dg Chest Port 1 View  Result Date: 10/23/2016 CLINICAL DATA:  Generalize weakness and fall tonight. History of CHF, diabetes, hypertension, shortness of breath, former smoker. EXAM: PORTABLE CHEST 1 VIEW COMPARISON:  02/22/2015  FINDINGS: Shallow inspiration. Cardiac enlargement with pulmonary vascular congestion. Diffuse interstitial pattern to the lungs likely represents edema. No blunting of costophrenic angles. No pneumothorax. Calcified and tortuous aorta. Probable esophageal hiatal hernia behind the heart. IMPRESSION: Cardiac enlargement with pulmonary vascular congestion and bilateral interstitial edema. Electronically Signed   By: Burman Nieves M.D.   On: 10/23/2016 01:21    Assessment and Plan:   1. 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 Patient has had recurring falls although potentially has been bradycardic in the 40s for the last few months based on discussion with family. She reports fatigue and is likely symptomatic due to her heart block, however there does not appear to be any clear cut syncope. The falls that she has had sound more like she has lost her balance or tripped. She has been on Cardizem CD 120 mg daily as an outpatient. This was held on admission to the hospital.  2. Valvular heart disease including mild aortic stenosis and at least moderate mitral stenosis by recent follow-up echocardiogram. This would also be consistent with progressive conduction system disease.  3. History of recurrent DVT, she is on chronic Coumadin. INR is 2.4 at this time.  4. Essential hypertension, blood pressure has been stable. Present she is on Norvasc and lisinopril.  5. Chronic diastolic heart failure, on Lasix as an  outpatient. Family indicates that she has not been entirely consistent with diuretic use.  Chart reviewed and updated. Discussion had with patient, granddaughter and her husband in the room. Agree with holding Cardizem CD, although suspect that Ms. Abelina Bachelor will ultimately need a pacemaker. Plan is to have her transferred to [facility] for formal evaluation by the EP service. Arrangements are being  made.  Signed, Joni Reining DNP, ANP, AACC 10/24/2016 11:46 AM    Attending note:  Patient seen and examined. I reviewed records and updated the chart. I also extensively modified the above note by Ms. Liborio Nixon, the assessment and plan section are my own. Ms. Ferdinand presents with frequent falls, not clearly associated with syncope, but is incidentally noted to be in complete heart block with heart rate in the 40s to 50s, right bundle branch block and leftward axis consistent with conduction system disease in the face of valvular heart disease including aortic stenosis and mitral stenosis. She has been on Cardizem CD as an outpatient, this has been discontinued. As discussed above, plan is to have her transferred to [facility] for formal evaluation by EP regarding potential for pacemaker placement. Of note, she is on Coumadin with history of recurrent DVTs, INR is presently therapeutic. Blood pressure is stable and she is in no distress at this time. Ms. Lyman Bishop DNP is making arrangements to have her transferred to [facility], and also in informing Dr. Janna Arch.  Jonelle Sidle, M.D., F.A.C.C.

## 2016-10-24 NOTE — Progress Notes (Signed)
ANTICOAGULATION CONSULT NOTE  Pharmacy Consult for Warfarin (home med) Indication: DVT (Chronic)  Allergies  Allergen Reactions  . Codeine Anxiety  . Compazine Anaphylaxis  . Other Anaphylaxis, Rash and Other (See Comments)    Uncoded Allergy. Allergen: INOVAR Uncoded Allergy. Allergen: ALL ADHESIVES Uncoded Allergy. Allergen: SILK SUTURE Uncoded Allergy. Allergen: merthiolate Thermasol  . Penicillins Shortness Of Breath and Rash    Has patient had a PCN reaction causing immediate rash, facial/tongue/throat swelling, SOB or lightheadedness with hypotension: Yes Has patient had a PCN reaction causing severe rash involving mucus membranes or skin necrosis: No Has patient had a PCN reaction that required hospitalization No Has patient had a PCN reaction occurring within the last 10 years: Yes If all of the above answers are "NO", then may proceed with Cephalosporin use.   Marland Kitchen Morphine And Related Other (See Comments)    Patient states that it makes her lose her state of mind.   . Demerol [Meperidine] Rash    Patient Measurements: Height: 5' 8.5" (174 cm) Weight: 212 lb 8.4 oz (96.4 kg) IBW/kg (Calculated) : 65.05 Heparin Dosing Weight:   Vital Signs: Temp: 97.7 F (36.5 C) (09/20 1117) Temp Source: Oral (09/20 1117) BP: 125/95 (09/20 1200) Pulse Rate: 43 (09/20 1300)  Labs:  Recent Labs  10/23/16 0053 10/23/16 0105 10/23/16 0501 10/24/16 0535  HGB 13.1 13.3  --   --   HCT 38.6 39.0  --   --   PLT 355  --   --   --   APTT 64*  --   --   --   LABPROT 22.2*  --   --  26.0*  INR 1.96  --   --  2.40  CREATININE 1.08* 1.10* 1.11* 1.81*  TROPONINI <0.03  --  <0.03  --     Estimated Creatinine Clearance: 31.9 mL/min (A) (by C-G formula based on SCr of 1.81 mg/dL (H)).   Medical History: Past Medical History:  Diagnosis Date  . Aortic stenosis   . Cataplexy   . Diastolic heart failure (HCC)   . Essential hypertension   . GERD (gastroesophageal reflux disease)    . Hiatal hernia   . History of recurrent deep vein thrombosis (DVT)   . Mitral stenosis   . Narcolepsy   . Neuropathy   . Type 2 diabetes mellitus (HCC)     Medications:  Prescriptions Prior to Admission  Medication Sig Dispense Refill Last Dose  . BYDUREON 2 MG PEN Inject 2 mg into the skin once a week.  5 10/19/2016  . celecoxib (CELEBREX) 200 MG capsule Take 200 mg by mouth daily. For arthritis   10/22/2016 at Unknown time  . Cholecalciferol (VITAMIN D3) 3000 UNITS TABS Take 1 capsule by mouth daily.   10/22/2016 at Unknown time  . clomiPRAMINE (ANAFRANIL) 25 MG capsule Take 25 mg by mouth 4 (four) times daily.    10/22/2016 at Unknown time  . clonazePAM (KLONOPIN) 1 MG tablet Take 1 mg by mouth at bedtime.    10/22/2016 at Unknown time  . Coenzyme Q10 (CO Q10) 200 MG CAPS Take 1 capsule by mouth daily.   10/22/2016 at Unknown time  . diltiazem (CARDIZEM CD) 120 MG 24 hr capsule Take 120 mg by mouth daily.   10/22/2016 at Unknown time  . DULoxetine (CYMBALTA) 60 MG capsule Take 60 mg by mouth daily.     10/22/2016 at Unknown time  . Ferrous Fumarate (FERROCITE) 324 (106 FE) MG TABS Take 1 capsule  by mouth daily.    10/22/2016 at Unknown time  . furosemide (LASIX) 40 MG tablet Take 20 mg by mouth 2 (two) times daily.   5 10/22/2016 at Unknown time  . Hyaluronic Acid-Vitamin C (HYALURONIC ACID PO) Take 100 mg by mouth daily.   10/22/2016 at Unknown time  . insulin glargine (LANTUS) 100 unit/mL SOPN Inject 20 Units into the skin at bedtime.    10/22/2016 at Unknown time  . Magnesium 300 MG CAPS Take 600 mg by mouth daily.   10/22/2016 at Unknown time  . metFORMIN (GLUCOPHAGE) 500 MG tablet Take 500 mg by mouth 2 (two) times daily with a meal.     10/22/2016 at Unknown time  . mirtazapine (REMERON) 15 MG tablet Take 15 mg by mouth at bedtime.     10/22/2016 at Unknown time  . potassium chloride (K-DUR) 10 MEQ tablet Take 1 tablet (10 mEq total) by mouth daily. 90 tablet 3 10/22/2016 at Unknown time  .  Probiotic Product (PROBIOTIC ADVANCED PO) Take 1 capsule by mouth daily.   10/22/2016 at Unknown time  . QUEtiapine (SEROQUEL) 100 MG tablet Take 100 mg by mouth at bedtime.   10/22/2016 at Unknown time  . warfarin (COUMADIN) 5 MG tablet See written post operative Coumadin instructions. (Patient taking differently: Take 2.5-5 mg by mouth every morning. Alternate taking 5mg  with 2.5mg  daily) 1 tablet 0 10/22/2016 at Unknown time    Assessment: Okay for Protocol.  No bleeding noted.  Head CT (-) acute changes.  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  Goal of Therapy:  INR 2-3   Plan:  Warfarin 2.5mg  PO x1 Daily PT/INR Monitor for signs and symptoms of bleeding.  Lamonte Richer R 10/24/2016,1:28 PM

## 2016-10-24 NOTE — Progress Notes (Signed)
Pt heart rate remains around 40. Pt is asymptomatic. BP 119/47. Daughter in room talking with patient at this time.

## 2016-10-24 NOTE — Progress Notes (Signed)
Pt is in no distress. Bipap not indicated at this time. Bipap is not in the room

## 2016-10-24 NOTE — Progress Notes (Signed)
Patient currently comfortable With A-V dissociation complete. Heart rate of 50 bpm hemodynamically stable patient is also anticoagulated on Coumadin for chronic and recurrent DVT. Patient will be transferred to cold for consideration of pacemaker after AV nodal blocking agents have washed out and EP evaluation. Echo reveals mild to questionably moderate aortic stenosis with normal systolic function Rebecca [name] DOB: [birth year] DOA: 10/23/2016 PCP: [name], MD   Physical Exam: Blood pressure (!) 135/52, pulse (!) 43, temperature 97.7 F (36.5 C), temperature source Oral, resp. rate 16, height 5' 8.5" (1.74 m), weight 96.4 kg (212 lb 8.4 oz), SpO2 91 %. Lungs clear to A&P no rales wheeze rhonchi heart regular rhythm 1/6 aortic outflow murmur no S3 auscultated no heaves thrills rubs   Investigations:  Recent Results (from the past 240 hour(s))  MRSA PCR Screening     Status: None   Collection Time: 10/23/16  8:59 PM  Result Value Ref Range Status   MRSA by PCR NEGATIVE NEGATIVE Final    Comment:        The GeneXpert MRSA Assay (FDA approved for NASAL specimens only), is one component of a comprehensive MRSA colonization surveillance program. It is not intended to diagnose MRSA infection nor to guide or monitor treatment for MRSA infections.      Basic Metabolic Panel:  Recent Labs  93/71/69 0501 10/24/16 0535  NA 136 135  K 4.0 4.0  CL 103 97*  CO2 23 27  GLUCOSE 135* 128*  BUN 26* 32*  CREATININE 1.11* 1.81*  CALCIUM 9.0 8.7*   Liver Function Tests:  Recent Labs  10/23/16 0053  AST 21  ALT 19  ALKPHOS 60  BILITOT 0.5  PROT 7.7  ALBUMIN 3.3*     CBC:  Recent Labs  10/23/16 0053 10/23/16 0105  WBC 10.2  --   NEUTROABS 6.2  --   HGB 13.1 13.3  HCT 38.6 39.0  MCV 85.0  --   PLT 355  --     Ct Head Wo Contrast  Result Date: 10/23/2016 CLINICAL DATA:  Altered level of consciousness.  Fall tonight. EXAM: CT HEAD WITHOUT CONTRAST  CT CERVICAL SPINE WITHOUT CONTRAST TECHNIQUE: Multidetector CT imaging of the head and cervical spine was performed following the standard protocol without intravenous contrast. Multiplanar CT image reconstructions of the cervical spine were also generated. COMPARISON:  CT head 12/14/2005 FINDINGS: CT HEAD FINDINGS Brain: Diffuse cerebral atrophy. Mild ventricular dilatation consistent with central atrophy. Low-attenuation changes in the deep white matter consistent with small vessel ischemia. No mass effect or midline shift. No abnormal extra-axial fluid collections. Gray-white matter junctions are distinct. Basal cisterns are not effaced. No acute intracranial hemorrhage. Vascular: Vascular calcifications in the internal carotid arteries. Skull: No depressed skull fractures. Sinuses/Orbits: Paranasal sinuses and mastoid air cells are clear. Other: None. CT CERVICAL SPINE FINDINGS Alignment: Examination is limited due to motion artifact. Normal alignment of the cervical vertebrae and facet joints. C1-2 articulation appears intact. Skull base and vertebrae: No acute fracture. No primary bone lesion or focal pathologic process. Soft tissues and spinal canal: No prevertebral fluid or swelling. No visible canal hematoma. Disc levels: Degenerative changes throughout the cervical spine with narrowed interspaces and endplate hypertrophic changes. Degenerative changes throughout the cervical facet joints. Upper chest: Prominent nodular enlargement of the thyroid gland with left thyroid measuring 4.5 x 3.3 cm diameter. Other: None. IMPRESSION: 1. No acute intracranial abnormalities. Chronic atrophy and small vessel ischemic changes. 2. Normal alignment of the cervical  spine. Diffuse degenerative change. No acute displaced fractures identified. 3. Nodular enlargement of the thyroid gland particularly on the left. Suggest follow-up with ultrasound in the elective setting after resolution of acute process. Electronically  Signed   By: Burman Nieves M.D.   On: 10/23/2016 02:54   Ct Cervical Spine Wo Contrast  Result Date: 10/23/2016 CLINICAL DATA:  Altered level of consciousness.  Fall tonight. EXAM: CT HEAD WITHOUT CONTRAST CT CERVICAL SPINE WITHOUT CONTRAST TECHNIQUE: Multidetector CT imaging of the head and cervical spine was performed following the standard protocol without intravenous contrast. Multiplanar CT image reconstructions of the cervical spine were also generated. COMPARISON:  CT head 12/14/2005 FINDINGS: CT HEAD FINDINGS Brain: Diffuse cerebral atrophy. Mild ventricular dilatation consistent with central atrophy. Low-attenuation changes in the deep white matter consistent with small vessel ischemia. No mass effect or midline shift. No abnormal extra-axial fluid collections. Gray-white matter junctions are distinct. Basal cisterns are not effaced. No acute intracranial hemorrhage. Vascular: Vascular calcifications in the internal carotid arteries. Skull: No depressed skull fractures. Sinuses/Orbits: Paranasal sinuses and mastoid air cells are clear. Other: None. CT CERVICAL SPINE FINDINGS Alignment: Examination is limited due to motion artifact. Normal alignment of the cervical vertebrae and facet joints. C1-2 articulation appears intact. Skull base and vertebrae: No acute fracture. No primary bone lesion or focal pathologic process. Soft tissues and spinal canal: No prevertebral fluid or swelling. No visible canal hematoma. Disc levels: Degenerative changes throughout the cervical spine with narrowed interspaces and endplate hypertrophic changes. Degenerative changes throughout the cervical facet joints. Upper chest: Prominent nodular enlargement of the thyroid gland with left thyroid measuring 4.5 x 3.3 cm diameter. Other: None. IMPRESSION: 1. No acute intracranial abnormalities. Chronic atrophy and small vessel ischemic changes. 2. Normal alignment of the cervical spine. Diffuse degenerative change. No acute  displaced fractures identified. 3. Nodular enlargement of the thyroid gland particularly on the left. Suggest follow-up with ultrasound in the elective setting after resolution of acute process. Electronically Signed   By: Burman Nieves M.D.   On: 10/23/2016 02:54   Dg Chest Port 1 View  Result Date: 10/23/2016 CLINICAL DATA:  Generalize weakness and fall tonight. History of CHF, diabetes, hypertension, shortness of breath, former smoker. EXAM: PORTABLE CHEST 1 VIEW COMPARISON:  02/22/2015 FINDINGS: Shallow inspiration. Cardiac enlargement with pulmonary vascular congestion. Diffuse interstitial pattern to the lungs likely represents edema. No blunting of costophrenic angles. No pneumothorax. Calcified and tortuous aorta. Probable esophageal hiatal hernia behind the heart. IMPRESSION: Cardiac enlargement with pulmonary vascular congestion and bilateral interstitial edema. Electronically Signed   By: Burman Nieves M.D.   On: 10/23/2016 01:21      Medications:  Impression:  Active Problems:   Pulmonary edema     Plan observe rhythm, heart block. Consider holding Coumadin.   Consultants: Cardiology   Procedures patient to be transferred to [facility] for evaluation by EP services consideration of pacer   Antibiotics:            Time spent: 30 minutes   LOS: 1 day   Shawan Tosh M   10/24/2016, 12:52 PM

## 2016-10-24 NOTE — Progress Notes (Signed)
**Note De-identified  Obfuscation** EKG completed and placed in patient chart 

## 2016-10-25 ENCOUNTER — Encounter (HOSPITAL_COMMUNITY)
Admission: EM | Disposition: A | Discharge status: Skilled Nursing Facility | Payer: Self-pay | Source: Home / Self Care | Attending: Cardiology

## 2016-10-25 ENCOUNTER — Inpatient Hospital Stay (HOSPITAL_COMMUNITY): Payer: Medicare Other

## 2016-10-25 DIAGNOSIS — I38 Endocarditis, valve unspecified: Secondary | ICD-10-CM

## 2016-10-25 DIAGNOSIS — I442 Atrioventricular block, complete: Secondary | ICD-10-CM

## 2016-10-25 DIAGNOSIS — I441 Atrioventricular block, second degree: Secondary | ICD-10-CM

## 2016-10-25 DIAGNOSIS — R296 Repeated falls: Secondary | ICD-10-CM

## 2016-10-25 HISTORY — PX: PACEMAKER IMPLANT: EP1218

## 2016-10-25 LAB — BASIC METABOLIC PANEL
ANION GAP: 9 (ref 5–15)
BUN: 40 mg/dL — ABNORMAL HIGH (ref 6–20)
CALCIUM: 8.7 mg/dL — AB (ref 8.9–10.3)
CO2: 26 mmol/L (ref 22–32)
Chloride: 99 mmol/L — ABNORMAL LOW (ref 101–111)
Creatinine, Ser: 1.43 mg/dL — ABNORMAL HIGH (ref 0.44–1.00)
GFR, EST AFRICAN AMERICAN: 40 mL/min — AB (ref >60)
GFR, EST NON AFRICAN AMERICAN: 34 mL/min — AB (ref >60)
Glucose, Bld: 146 mg/dL — ABNORMAL HIGH (ref 65–99)
Potassium: 4.1 mmol/L (ref 3.5–5.1)
Sodium: 134 mmol/L — ABNORMAL LOW (ref 135–145)

## 2016-10-25 LAB — PROTIME-INR
INR: 2.08
Prothrombin Time: 23.2 seconds — ABNORMAL HIGH (ref 11.4–15.2)

## 2016-10-25 LAB — MRSA PCR SCREENING: MRSA BY PCR: NEGATIVE

## 2016-10-25 LAB — GLUCOSE, CAPILLARY: GLUCOSE-CAPILLARY: 164 mg/dL — AB (ref 65–99)

## 2016-10-25 SURGERY — PACEMAKER IMPLANT

## 2016-10-25 MED ORDER — SODIUM CHLORIDE 0.9 % IR SOLN
80.0000 mg | Status: AC
  Administered 2016-10-25: 80 mg

## 2016-10-25 MED ORDER — FENTANYL CITRATE (PF) 100 MCG/2ML IJ SOLN
INTRAMUSCULAR | Status: AC
  Filled 2016-10-25: qty 2

## 2016-10-25 MED ORDER — HEPARIN (PORCINE) IN NACL 2-0.9 UNIT/ML-% IJ SOLN
INTRAMUSCULAR | Status: AC
  Filled 2016-10-25: qty 500

## 2016-10-25 MED ORDER — SODIUM CHLORIDE 0.9 % IV SOLN: INTRAVENOUS | Status: DC

## 2016-10-25 MED ORDER — WARFARIN SODIUM 5 MG PO TABS: 5.0000 mg | ORAL_TABLET | Freq: Once | ORAL | Status: DC

## 2016-10-25 MED ORDER — LIDOCAINE HCL (PF) 1 % IJ SOLN
INTRAMUSCULAR | Status: AC
  Filled 2016-10-25: qty 60

## 2016-10-25 MED ORDER — MUPIROCIN 2 % EX OINT
TOPICAL_OINTMENT | Freq: Two times a day (BID) | CUTANEOUS | Status: DC
  Administered 2016-10-25: 1 via NASAL
  Administered 2016-10-27: 23:00:00 via NASAL
  Filled 2016-10-25: qty 22

## 2016-10-25 MED ORDER — VANCOMYCIN HCL IN DEXTROSE 1-5 GM/200ML-% IV SOLN
1000.0000 mg | INTRAVENOUS | Status: AC
  Administered 2016-10-25: 1000 mg via INTRAVENOUS

## 2016-10-25 MED ORDER — MIDAZOLAM HCL 5 MG/5ML IJ SOLN
INTRAMUSCULAR | Status: AC
  Filled 2016-10-25: qty 5

## 2016-10-25 MED ORDER — SODIUM CHLORIDE 0.9 % IR SOLN
Status: AC
  Filled 2016-10-25: qty 2

## 2016-10-25 MED ORDER — FENTANYL CITRATE (PF) 100 MCG/2ML IJ SOLN
INTRAMUSCULAR | Status: DC | PRN
  Administered 2016-10-25 (×2): 25 ug via INTRAVENOUS

## 2016-10-25 MED ORDER — LIDOCAINE HCL (PF) 1 % IJ SOLN
INTRAMUSCULAR | Status: DC | PRN
  Administered 2016-10-25: 60 mL

## 2016-10-25 MED ORDER — ACETAMINOPHEN 325 MG PO TABS
325.0000 mg | ORAL_TABLET | ORAL | Status: DC | PRN
  Administered 2016-10-26 – 2016-10-27 (×2): 650 mg via ORAL
  Filled 2016-10-25 (×2): qty 2

## 2016-10-25 MED ORDER — MUPIROCIN 2 % EX OINT
TOPICAL_OINTMENT | CUTANEOUS | Status: AC
  Filled 2016-10-25: qty 22

## 2016-10-25 MED ORDER — VANCOMYCIN HCL IN DEXTROSE 1-5 GM/200ML-% IV SOLN
INTRAVENOUS | Status: AC
  Filled 2016-10-25: qty 200

## 2016-10-25 MED ORDER — HEPARIN (PORCINE) IN NACL 2-0.9 UNIT/ML-% IJ SOLN
INTRAMUSCULAR | Status: AC | PRN
  Administered 2016-10-25: 500 mL

## 2016-10-25 MED ORDER — VANCOMYCIN HCL IN DEXTROSE 1-5 GM/200ML-% IV SOLN
1000.0000 mg | Freq: Two times a day (BID) | INTRAVENOUS | Status: AC
  Administered 2016-10-26: 1000 mg via INTRAVENOUS
  Filled 2016-10-25: qty 200

## 2016-10-25 MED ORDER — ONDANSETRON HCL 4 MG/2ML IJ SOLN: 4.0000 mg | Freq: Four times a day (QID) | INTRAMUSCULAR | Status: DC | PRN

## 2016-10-25 SURGICAL SUPPLY — 9 items
CABLE SURGICAL S-101-97-12 (CABLE) ×2 IMPLANT
HOVERMATT SINGLE USE (MISCELLANEOUS) ×2 IMPLANT
IPG PACE AZUR XT DR MRI W1DR01 (Pacemaker) IMPLANT
LEAD CAPSURE NOVUS 5076-52CM (Lead) ×2 IMPLANT
LEAD CAPSURE NOVUS 5076-58CM (Lead) ×2 IMPLANT
PACE AZURE XT DR MRI W1DR01 (Pacemaker) ×3 IMPLANT
PAD DEFIB LIFELINK (PAD) ×2 IMPLANT
SHEATH CLASSIC 7F (SHEATH) ×4 IMPLANT
TRAY PACEMAKER INSERTION (PACKS) ×2 IMPLANT

## 2016-10-25 NOTE — Consult Note (Signed)
ELECTROPHYSIOLOGY CONSULT NOTE    Patient ID: Rebecca [name] MRN: [id number], DOB/AGE: 1940-12-18 76 y.o.  Admit date: 10/23/2016 Date of Consult: 10/25/2016  Primary Physician: [name], MD Primary Cardiologist: Purvis Sheffield  Electrophysiologist: Elberta Fortis - new this admission  Patient Profile: Rebecca [name] is a 76 y.o. female with a history of chronic diastolic heart failure, moderate MS, mild AS, HTN, recurrent DVT on Warfarin who is being seen today for the evaluation of heart block at the request of Dr Purvis Sheffield.  HPI:  Rebecca [name] is a 76 y.o. female who presented to [facility] on the day of admission for evaluation of generalized weakness and a fall at home. She reports that her weakness has been ongoing for the last 2 months.  EMS was called and on arrival, she was found to be bradycardic and transferred to [facility] for further evaluation. She was in high grade AV block with narrow junctional escape. Her AVN blocking agents have been held for >48 hours with persistent heart block and bradycardia.   She denies chest pain, palpitations, dyspnea, PND, orthopnea, nausea, vomiting, dizziness, syncope, edema, weight gain, or early satiety.  Past Medical History:  Diagnosis Date  . Aortic stenosis   . Cataplexy   . Diastolic heart failure (HCC)   . Essential hypertension   . GERD (gastroesophageal reflux disease)   . Hiatal hernia   . History of recurrent deep vein thrombosis (DVT)   . Mitral stenosis   . Narcolepsy   . Neuropathy   . Type 2 diabetes mellitus [facility])      Surgical History:  Past Surgical History:  Procedure Laterality Date  . ABDOMINAL HYSTERECTOMY    . CARPAL TUNNEL RELEASE    . CARPECTOMY  12/26/2011   Procedure: CARPECTOMY;  Surgeon: Wyn Forster., MD;  Location: [facility];  Service: Orthopedics;  Laterality: Right;  PROXIMAL ROW CARPECTOMY RIGHT WRIST and neurectomy  . CHOLECYSTECTOMY    . HEMORROIDECTOMY    . KIDNEY  SURGERY    . NEPHROSTOMY    . TOTAL HIP ARTHROPLASTY     bilateral     Prescriptions Prior to Admission  Medication Sig Dispense Refill Last Dose  . BYDUREON 2 MG PEN Inject 2 mg into the skin once a week.  5 10/19/2016  . celecoxib (CELEBREX) 200 MG capsule Take 200 mg by mouth daily. For arthritis   10/22/2016 at Unknown time  . Cholecalciferol (VITAMIN D3) 3000 UNITS TABS Take 1 capsule by mouth daily.   10/22/2016 at Unknown time  . clomiPRAMINE (ANAFRANIL) 25 MG capsule Take 25 mg by mouth 4 (four) times daily.    10/22/2016 at Unknown time  . clonazePAM (KLONOPIN) 1 MG tablet Take 1 mg by mouth at bedtime.    10/22/2016 at Unknown time  . Coenzyme Q10 (CO Q10) 200 MG CAPS Take 1 capsule by mouth daily.   10/22/2016 at Unknown time  . diltiazem (CARDIZEM CD) 120 MG 24 hr capsule Take 120 mg by mouth daily.   10/22/2016 at Unknown time  . DULoxetine (CYMBALTA) 60 MG capsule Take 60 mg by mouth daily.     10/22/2016 at Unknown time  . Ferrous Fumarate (FERROCITE) 324 (106 FE) MG TABS Take 1 capsule by mouth daily.    10/22/2016 at Unknown time  . furosemide (LASIX) 40 MG tablet Take 20 mg by mouth 2 (two) times daily.   5 10/22/2016 at Unknown time  . Hyaluronic Acid-Vitamin C (HYALURONIC ACID  PO) Take 100 mg by mouth daily.   10/22/2016 at Unknown time  . insulin glargine (LANTUS) 100 unit/mL SOPN Inject 20 Units into the skin at bedtime.    10/22/2016 at Unknown time  . Magnesium 300 MG CAPS Take 600 mg by mouth daily.   10/22/2016 at Unknown time  . metFORMIN (GLUCOPHAGE) 500 MG tablet Take 500 mg by mouth 2 (two) times daily with a meal.     10/22/2016 at Unknown time  . mirtazapine (REMERON) 15 MG tablet Take 15 mg by mouth at bedtime.     10/22/2016 at Unknown time  . potassium chloride (K-DUR) 10 MEQ tablet Take 1 tablet (10 mEq total) by mouth daily. 90 tablet 3 10/22/2016 at Unknown time  . Probiotic Product (PROBIOTIC ADVANCED PO) Take 1 capsule by mouth daily.   10/22/2016 at Unknown time  .  QUEtiapine (SEROQUEL) 100 MG tablet Take 100 mg by mouth at bedtime.   10/22/2016 at Unknown time  . warfarin (COUMADIN) 5 MG tablet See written post operative Coumadin instructions. (Patient taking differently: Take 2.5-5 mg by mouth every morning. Alternate taking  with 2.5mg  daily) 1 tablet 0 10/22/2016 at Unknown time    Inpatient Medications:  . amLODipine  5 mg Oral Daily  . aspirin EC  81 mg Oral Daily  . clomiPRAMINE  25 mg Oral QID  . DULoxetine  60 mg Oral Daily  . furosemide  60 mg Intravenous BID  . mirtazapine  15 mg Oral QHS  . QUEtiapine  100 mg Oral QHS  . sodium chloride flush  3 mL Intravenous Q12H  . warfarin  5 mg Oral Once  . Warfarin - Pharmacist Dosing Inpatient   Does not apply Q24H    Allergies:  Allergies  Allergen Reactions  . Codeine Anxiety  . Compazine Anaphylaxis  . Other Anaphylaxis, Rash and Other (See Comments)    Uncoded Allergy. Allergen: INOVAR Uncoded Allergy. Allergen: ALL ADHESIVES Uncoded Allergy. Allergen: SILK SUTURE Uncoded Allergy. Allergen: merthiolate Thermasol  . Penicillins Shortness Of Breath and Rash    Has patient had a PCN reaction causing immediate rash, facial/tongue/throat swelling, SOB or lightheadedness with hypotension: Yes Has patient had a PCN reaction causing severe rash involving mucus membranes or skin necrosis: No Has patient had a PCN reaction that required hospitalization No Has patient had a PCN reaction occurring within the last 10 years: Yes If all of the above answers are "NO", then may proceed with Cephalosporin use.   Marland Kitchen Morphine And Related Other (See Comments)    Patient states that it makes her lose her state of mind.   . Demerol [Meperidine] Rash    Social History   Social History  . Marital status: Widowed    Spouse name: N/A  . Number of children: N/A  . Years of education: 64   Occupational History  .  Retired   Social History Main Topics  . Smoking status: Former Smoker    Quit date:  10/13/1988  . Smokeless tobacco: Never Used  . Alcohol use No  . Drug use: No  . Sexual activity: Not on file   Other Topics Concern  . Not on file   Social History Narrative  . No narrative on file     Family History  Problem Relation Age of Onset  . Heart disease Father   . Diabetes Unknown   . Stroke Mother   . Scleroderma Brother      Review of Systems: All other systems  reviewed and are otherwise negative except as noted above.  Physical Exam: Vitals:   10/25/16 0400 10/25/16 0500 10/25/16 0737 10/25/16 0800  BP: (!) 129/43   (!) 130/49  Pulse: (!) 43   (!) 41  Resp: 18   (!) 22  Temp: 98.8 F (37.1 C)  98.2 F (36.8 C)   TempSrc: Oral  Oral   SpO2: 91%   (!) 88%  Weight:  214 lb 4.6 oz (97.2 kg)    Height:        GEN- The patient is elderly appearing, alert and oriented x 3 today.   HEENT: normocephalic, atraumatic; sclera clear, conjunctiva pink; hearing intact; oropharynx clear; neck supple Lungs- Clear to ausculation bilaterally, normal work of breathing.  No wheezes, rales, rhonchi Heart- Bradycardic regular rate and rhythm  GI- soft, non-tender, non-distended, bowel sounds present Extremities- no clubbing, cyanosis, or edema  MS- no significant deformity or atrophy Skin- warm and dry, no rash or lesion Psych- euthymic mood, full affect Neuro- strength and sensation are intact  Labs:   Lab Results  Component Value Date   WBC 10.2 10/23/2016   HGB 13.3 10/23/2016   HCT 39.0 10/23/2016   MCV 85.0 10/23/2016   PLT 355 10/23/2016    Recent Labs Lab 10/23/16 0053  10/25/16 0552  NA 134*  < > 134*  K 3.9  < > 4.1  CL 100*  < > 99*  CO2 23  < > 26  BUN 25*  < > 40*  CREATININE 1.08*  < > 1.43*  CALCIUM 9.1  < > 8.7*  PROT 7.7  --   --   BILITOT 0.5  --   --   ALKPHOS 60  --   --   ALT 19  --   --   AST 21  --   --   GLUCOSE 132*  < > 146*  < > = values in this interval not displayed.    Radiology/Studies: Dg Shoulder Right  Result  Date: 10/25/2016 CLINICAL DATA:  Pain following fall EXAM: RIGHT SHOULDER - 2+ VIEW COMPARISON:  None. FINDINGS: Frontal and Y scapular images were obtained. No evident fracture or dislocation. There is mild narrowing of the glenohumeral joint. The acromioclavicular joint appears normal. No erosive change or intra-articular calcification. Visualized right lung is clear. IMPRESSION: Narrowing of the glenohumeral joint. Acromioclavicular joint unremarkable. No fracture or dislocation. Electronically Signed   By: Bretta Bang III M.D.   On: 10/25/2016 10:45   Ct Head Wo Contrast  Result Date: 10/23/2016 CLINICAL DATA:  Altered level of consciousness.  Fall tonight. EXAM: CT HEAD WITHOUT CONTRAST CT CERVICAL SPINE WITHOUT CONTRAST TECHNIQUE: Multidetector CT imaging of the head and cervical spine was performed following the standard protocol without intravenous contrast. Multiplanar CT image reconstructions of the cervical spine were also generated. COMPARISON:  CT head 12/14/2005 FINDINGS: CT HEAD FINDINGS Brain: Diffuse cerebral atrophy. Mild ventricular dilatation consistent with central atrophy. Low-attenuation changes in the deep white matter consistent with small vessel ischemia. No mass effect or midline shift. No abnormal extra-axial fluid collections. Gray-white matter junctions are distinct. Basal cisterns are not effaced. No acute intracranial hemorrhage. Vascular: Vascular calcifications in the internal carotid arteries. Skull: No depressed skull fractures. Sinuses/Orbits: Paranasal sinuses and mastoid air cells are clear. Other: None. CT CERVICAL SPINE FINDINGS Alignment: Examination is limited due to motion artifact. Normal alignment of the cervical vertebrae and facet joints. C1-2 articulation appears intact. Skull base and vertebrae: No acute fracture.  No primary bone lesion or focal pathologic process. Soft tissues and spinal canal: No prevertebral fluid or swelling. No visible canal hematoma.  Disc levels: Degenerative changes throughout the cervical spine with narrowed interspaces and endplate hypertrophic changes. Degenerative changes throughout the cervical facet joints. Upper chest: Prominent nodular enlargement of the thyroid gland with left thyroid measuring 4.5 x 3.3 cm diameter. Other: None. IMPRESSION: 1. No acute intracranial abnormalities. Chronic atrophy and small vessel ischemic changes. 2. Normal alignment of the cervical spine. Diffuse degenerative change. No acute displaced fractures identified. 3. Nodular enlargement of the thyroid gland particularly on the left. Suggest follow-up with ultrasound in the elective setting after resolution of acute process. Electronically Signed   By: Burman Nieves M.D.   On: 10/23/2016 02:54   Ct Cervical Spine Wo Contrast  Result Date: 10/23/2016 CLINICAL DATA:  Altered level of consciousness.  Fall tonight. EXAM: CT HEAD WITHOUT CONTRAST CT CERVICAL SPINE WITHOUT CONTRAST TECHNIQUE: Multidetector CT imaging of the head and cervical spine was performed following the standard protocol without intravenous contrast. Multiplanar CT image reconstructions of the cervical spine were also generated. COMPARISON:  CT head 12/14/2005 FINDINGS: CT HEAD FINDINGS Brain: Diffuse cerebral atrophy. Mild ventricular dilatation consistent with central atrophy. Low-attenuation changes in the deep white matter consistent with small vessel ischemia. No mass effect or midline shift. No abnormal extra-axial fluid collections. Gray-white matter junctions are distinct. Basal cisterns are not effaced. No acute intracranial hemorrhage. Vascular: Vascular calcifications in the internal carotid arteries. Skull: No depressed skull fractures. Sinuses/Orbits: Paranasal sinuses and mastoid air cells are clear. Other: None. CT CERVICAL SPINE FINDINGS Alignment: Examination is limited due to motion artifact. Normal alignment of the cervical vertebrae and facet joints. C1-2  articulation appears intact. Skull base and vertebrae: No acute fracture. No primary bone lesion or focal pathologic process. Soft tissues and spinal canal: No prevertebral fluid or swelling. No visible canal hematoma. Disc levels: Degenerative changes throughout the cervical spine with narrowed interspaces and endplate hypertrophic changes. Degenerative changes throughout the cervical facet joints. Upper chest: Prominent nodular enlargement of the thyroid gland with left thyroid measuring 4.5 x 3.3 cm diameter. Other: None. IMPRESSION: 1. No acute intracranial abnormalities. Chronic atrophy and small vessel ischemic changes. 2. Normal alignment of the cervical spine. Diffuse degenerative change. No acute displaced fractures identified. 3. Nodular enlargement of the thyroid gland particularly on the left. Suggest follow-up with ultrasound in the elective setting after resolution of acute process. Electronically Signed   By: Burman Nieves M.D.   On: 10/23/2016 02:54   Dg Chest Port 1 View  Result Date: 10/23/2016 CLINICAL DATA:  Generalize weakness and fall tonight. History of CHF, diabetes, hypertension, shortness of breath, former smoker. EXAM: PORTABLE CHEST 1 VIEW COMPARISON:  02/22/2015 FINDINGS: Shallow inspiration. Cardiac enlargement with pulmonary vascular congestion. Diffuse interstitial pattern to the lungs likely represents edema. No blunting of costophrenic angles. No pneumothorax. Calcified and tortuous aorta. Probable esophageal hiatal hernia behind the heart. IMPRESSION: Cardiac enlargement with pulmonary vascular congestion and bilateral interstitial edema. Electronically Signed   By: Burman Nieves M.D.   On: 10/23/2016 01:21   Dg Hip Unilat With Pelvis 2-3 Views Right  Result Date: 09/25/2016 CLINICAL DATA:  Fall 2 weeks ago. Right lateral hip pain that has gotten worse. EXAM: DG HIP (WITH OR WITHOUT PELVIS) 2-3V RIGHT COMPARISON:  None. FINDINGS: Initial encounter. Bilateral total hip  arthroplasty. No acute fracture, dislocation, or evidence of hardware loosening. Pelvic ring is intact. Osteopenia. Benign calcifications over  the pelvis. Prominent lumbar disc degeneration with scoliosis. IMPRESSION: 1. No acute finding. 2. Bilateral total hip arthroplasty without complicating feature. 3. Osteopenia.  Advanced lumbar disc degeneration with scoliosis. Electronically Signed   By: Marnee Spring M.D.   On: 09/25/2016 16:28    EKG:2:1 AV block (personally reviewed)  Assessment/Plan: 1.  2:1 AV block The patient has symptomatic 2:1 AV block despite washout of AVN blocking agents.  She meets criteria for pacemaker implantation at this time. Risks, benefits reviewed with the patient who wishes to proceed. Rebecca [name] plan for later today with Dr Elberta Fortis.   2.  HTN Stable No change required today  3.  Chronic diastolic heart failure Stable No change required today  4. Valvular heart disease Stable by recent echo  5. Recurrent DVT Continue Warfarin long term INR is 2.08 today - ok to continue per pharmacy   6. AKI Should improve with pacing and 1:1 conduction Rebecca [name] repeat BMET tomorrow   Signed, Rebecca [name] 10/25/2016 12:55 PM    I have seen and examined this patient with Rebecca [name].  Agree with above, note added to reflect my findings.  On exam, bradycardic, no murmurs, lungs clear. Presented to [facility] hospital with fatigue, found to be in high grade heart block. AV nodal blocking agents were stopped but heart block continued. Presents today for pacemaker. Risks and benefits discussed. Risks include but not limited to bleeding, infection, tamponade, pneumothorax. She understands the risks and has agreed to the procedure.    Rebecca [name] M. Nusayba Cadenas MD 10/25/2016 1:58 PM

## 2016-10-25 NOTE — Discharge Summary (Signed)
ELECTROPHYSIOLOGY PROCEDURE DISCHARGE SUMMARY    Patient ID: Rebecca [name],  MRN: [id number], DOB/AGE: Jun 26, 1940 76 y.o.  Admit date: 10/23/2016 Discharge date: 10/28/2016  Primary Care Physician: [name], MD Primary Cardiologist: Purvis Sheffield Electrophysiologist: Elberta Fortis  Primary Discharge Diagnosis:  1. 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 PPM hematoma 3. Functional decline  Secondary Discharge Diagnosis:  1.  Chronic diastolic heart failure 2.  Moderate MS 3.  Mild AS 4.  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 Codeine Anxiety  . Compazine Anaphylaxis  . Other Anaphylaxis, Rash and Other (See Comments)    Uncoded Allergy. Allergen: INOVAR Uncoded Allergy. Allergen: ALL ADHESIVES Uncoded Allergy. Allergen: SILK SUTURE Uncoded Allergy. Allergen: merthiolate Thermasol  . Penicillins Shortness Of Breath and Rash    Has patient had a PCN reaction causing immediate rash, facial/tongue/throat swelling, SOB or lightheadedness with hypotension: Yes Has patient had a PCN reaction causing severe rash involving mucus membranes or skin necrosis: No Has patient had a PCN reaction that required hospitalization No Has patient had a PCN reaction occurring within the last 10 years: Yes If all of the above answers are "NO", then may proceed with Cephalosporin use.   Marland Kitchen Morphine And Related Other (See Comments)    Patient states that it makes her lose her state of mind.   . Demerol [Meperidine] Rash     Procedures This Admission:  1.  Implantation of a MDT dual chamber PPM on 10/25/16 by Dr Elberta Fortis.  See op note for full details.  There were no immediate post procedure complications. 2.  CXR on 10/26/16 demonstrated no pneumothorax status post device implantation.   Brief HPI/Hospital Course:  Rebecca [name] is a 76 y.o. female with a past medical history as outlined above.  She presented to the ER at [facility] for evaluation of fatigue, weakness, and falls. She was found to be in high grade AV block.  Her Diltiazem was allowed to wash out without improvement in conduction. The patient has had symptomatic bradycardia without reversible causes identified.  Risks, benefits, and alternatives to PPM implantation were reviewed with the patient who wished to proceed. The patient was admitted and underwent implantation of a MDT dual chamber PPM with details as outlined above.  She unfortunately developed a PPM pocket hematoma, a pressure dressing was was applied and her warfarin held.  The patient quickly had observed functional decline and PT eval was requested.  She had yesterday labs that were quite abnormal, today's though look OK and susoect yetsreday's to have been spurious.   PT evaluation was done yesterday and recommended SNF/rehab, the patient initially resistant to the idea but this morning feels is the best POC and agreeable.  She has been monitored on telemetry which demonstrated SR/ Vpaced.  Left chest has a small hematoma, no bleeding, drainage.  The device was interrogated post procedure day #1 and found to be functioning normally.  CXR was post procedure day #1 and demonstrated no pneumothorax status post device implantation.  Wound care, arm mobility, and restrictions were reviewed with the patient.  The patient was examined by Dr. Elberta Fortis and considered stable for discharge to SNF/rehab today once arranged. Resume home coumadin regime (  alternating days with 2.5mg ) today to follow INR's at SNF.  Jeneane Pieczynski schedule long term follow up with Dr Ladona Ridgel in [place] for patient convenience.     Supplemental Discharge Instructions for  Pacemaker/Defibrillator Patients  Activity No  heavy lifting or vigorous activity with your left/right arm for 6 to 8 weeks.  Do not raise your left/right arm above your head for one week.  Gradually raise your affected arm as drawn below.               10/29/16                     10/30/16                    10/31/16                  11/01/16 __  NO DRIVING for  1 week  ; you may begin driving on  1/61/09  .  WOUND CARE - Keep the wound area clean and dry.  Do not get this area wet, no showers until cleared to at your wound check visit - The tape/steri-strips on your wound Bejamin Hackbart fall off; do not pull them off.  No bandage is needed on the site.  DO  NOT apply any creams, oils, or ointments to the wound area. - If you notice any drainage or discharge from the wound, any swelling or bruising at the site, or you develop a fever > 101? F after you are discharged home, call the office at once.  Special Instructions - You are still able to use cellular telephones; use the ear opposite the side where you have your pacemaker/defibrillator.  Avoid carrying your cellular phone near your device. - When traveling through airports, show security personnel your identification card to avoid being screened in the metal detectors.  Ask the security personnel to use the hand wand. - Avoid arc welding equipment, MRI testing (magnetic resonance imaging), TENS units (transcutaneous nerve stimulators).  Call the office for questions about other devices. - Avoid electrical appliances that are in poor condition or are not properly grounded. - Microwave ovens are safe to be near or to operate.     Physical Exam: Vitals:   10/27/16 0530 10/27/16 1444 10/27/16 2249 10/28/16 0500  BP: (!) 151/73 (!) 149/66  (!) 142/65  Pulse: 83 79 75 84  Resp: 20 (!) 24  19  Temp: 97.9 F (36.6 C) 97.8 F (36.6 C) 98.1 F (36.7 C) 98 F (36.7 C)  TempSrc: Oral Oral Oral Oral  SpO2: 94% (!) 88% 98% 99%  Weight: 196 lb (88.9 kg)   201 lb 4.8 oz (91.3 kg)  Height:        GEN- The patient is well appearing, alert and oriented x 3 today.   HEENT: normocephalic, atraumatic; sclera clear, conjunctiva pink; hearing intact; oropharynx clear; neck supple  Lungs-  CTA b/l, normal work of breathing.  No  wheezes, rales, rhonchi Heart- RRR, no murmurs, rubs or gallops  GI- soft, non-tender, non-distended Extremities- no clubbing, cyanosis, or edema MS- no significant deformity or atrophy Skin- warm and dry, no rash or lesion, left chest with small hematoma, minimal ecchymosis Psych- euthymic mood, full affect Neuro- strength and sensation are intact   Labs:   Lab Results  Component Value Date   WBC 9.8 10/28/2016   HGB 12.8 10/28/2016   HCT 37.8 10/28/2016   MCV 83.3 10/28/2016   PLT 349 10/28/2016    Recent Labs Lab 10/23/16 0053  10/28/16 0511  NA 134*  < > 134*  K 3.9  < > 4.0  CL 100*  < > 98*  CO2 23  < > 28  BUN 25*  < > 30*  CREATININE 1.08*  < > 1.07*  CALCIUM 9.1  < > 9.0  PROT 7.7  --   --   BILITOT 0.5  --   --   ALKPHOS 60  --   --   ALT 19  --   --   AST 21  --   --   GLUCOSE 132*  < > 198*  < > = values in this interval not displayed.  Discharge Medications:  Allergies as of 10/28/2016      Reactions   Codeine Anxiety   Compazine Anaphylaxis   Other Anaphylaxis, Rash, Other (See Comments)   Uncoded Allergy. Allergen: INOVAR Uncoded Allergy. Allergen: ALL ADHESIVES Uncoded Allergy. Allergen: SILK SUTURE Uncoded Allergy. Allergen: merthiolate Thermasol   Penicillins Shortness Of Breath, Rash   Has patient had a PCN reaction causing immediate rash, facial/tongue/throat swelling, SOB or lightheadedness with hypotension: Yes Has patient had a PCN reaction causing severe rash involving mucus membranes or skin necrosis: No Has patient had a PCN reaction that required hospitalization No Has patient had a PCN reaction occurring within the last 10 years: Yes If all of the above answers are "NO", then may proceed with Cephalosporin use.   Morphine And Related Other (See Comments)   Patient states that it makes her lose her state of mind.    Demerol [meperidine] Rash      Medication List    STOP taking these medications   diltiazem 120 MG 24 hr  capsule Commonly known as:  CARDIZEM CD     TAKE these medications   BYDUREON 2 MG Pen Generic drug:  Exenatide ER Inject 2 mg into the skin once a week.   carvedilol 3.125 MG tablet Commonly known as:  COREG Take 1 tablet (3.125 mg total) by mouth 2 (two) times daily with a meal.   celecoxib 200 MG capsule Commonly known as:  CELEBREX Take 200 mg by mouth daily. For arthritis   clomiPRAMINE 25 MG capsule Commonly known as:  ANAFRANIL Take 25 mg by mouth 4 (four) times daily.   clonazePAM 1 MG tablet Commonly known as:  KLONOPIN Take 1 mg by mouth at bedtime.   Co Q10 200 MG Caps Take 1 capsule by mouth daily.   DULoxetine 60 MG capsule Commonly known as:  CYMBALTA Take 60 mg by mouth daily.   FERROCITE 324 (106 Fe) MG Tabs tablet Generic drug:  Ferrous Fumarate Take 1 capsule by mouth daily.   furosemide 40 MG tablet Commonly known as:  LASIX Take 20 mg by mouth 2 (two) times daily.   HYALURONIC ACID PO Take 100 mg by mouth daily.   insulin glargine 100 unit/mL Sopn Commonly known as:  LANTUS Inject 20 Units into the skin at bedtime.   Magnesium 300 MG Caps Take 600 mg by mouth daily.   metFORMIN 500 MG tablet Commonly known as:  GLUCOPHAGE Take 500 mg by mouth 2 (two) times daily with a meal.   mirtazapine 15 MG tablet Commonly known as:  REMERON Take 15 mg by mouth at bedtime.   potassium chloride 10 MEQ tablet Commonly known as:  K-DUR Take 1 tablet (10 mEq total) by mouth daily.   PROBIOTIC ADVANCED PO Take 1 capsule by mouth daily.   QUEtiapine 100 MG tablet Commonly known as:  SEROQUEL Take 100 mg by mouth at bedtime.   Vitamin D3 3000 units Tabs Take 1 capsule by mouth daily.   warfarin 5 MG  tablet Commonly known as:  COUMADIN Take 0.5-1 tablets (2.5-5 mg total) by mouth every morning. Alternate taking 5mg  with 2.5mg  daily What changed:  how much to take  how to take this  when to take this  additional instructions             Discharge Care Instructions        Start     Ordered   10/28/16 0000  carvedilol (COREG) 3.125 MG tablet  2 times daily with meals    Question:  Supervising Provider  Answer:  Hillis Range   10/28/16 1010   10/28/16 0000  Increase activity slowly     10/28/16 1201   10/28/16 0000  Diet - low sodium heart healthy     10/28/16 1201   10/28/16 0000  warfarin (COUMADIN) 5 MG tablet   Every morning - 10a    Question:  Supervising Provider  Answer:  Hillis Range   10/28/16 1201      Disposition: SNF Discharge Instructions    Diet - low sodium heart healthy    Complete by:  As directed    Increase activity slowly    Complete by:  As directed       Contact information for follow-up providers    [facility] Follow up on 11/07/2016.   Specialty:  Cardiology Why:  at 3:30PM Contact information: [address] [place] [address] [phone]       Marinus Maw, MD Follow up.   Specialty:  Cardiology Why:  Please call to make an appointment for December (3 months post pacemaker) Contact information: [address] [place] [address] [phone]            Contact information for after-discharge care    Destination    [facility] .   Specialty:  Skilled Nursing Facility Contact information: [address] [place] [address] [phone]                  Duration of Discharge Encounter: Greater than 30 minutes including physician time.  Norma Fredrickson, PA-C 10/28/2016 12:19 PM  I have seen and examined this patient with Francis Dowse.  Agree with above, note added to reflect my findings.  On exam, RRR, no murmurs, lungs clear. Return to the hospital in complete heart block. Had a Medtronic dual chamber pacemaker implanted. Interrogation and chest x-ray without major abnormality. Patient did have a hematoma and required a pressure dressing. Subsequent labs  showed a creatinine of 3 and a drop in her hemoglobin. She was thought to be in acute renal failure with anemia. Repeat labs showed normal creatinine of 1 and a hemoglobin of 12.5. It is possible that these labs were falsely abnormal. She worked with PT who felt that she would benefit from skilled nursing. Plan for discharge today.    Isley Weisheit M. Ervin Rothbauer MD 10/28/2016 2:01 PM

## 2016-10-25 NOTE — Progress Notes (Signed)
ANTICOAGULATION CONSULT NOTE  Pharmacy Consult for Warfarin (home med) Indication: DVT (Chronic)  Allergies  Allergen Reactions  . Codeine Anxiety  . Compazine Anaphylaxis  . Other Anaphylaxis, Rash and Other (See Comments)    Uncoded Allergy. Allergen: INOVAR Uncoded Allergy. Allergen: ALL ADHESIVES Uncoded Allergy. Allergen: SILK SUTURE Uncoded Allergy. Allergen: merthiolate Thermasol  . Penicillins Shortness Of Breath and Rash    Has patient had a PCN reaction causing immediate rash, facial/tongue/throat swelling, SOB or lightheadedness with hypotension: Yes Has patient had a PCN reaction causing severe rash involving mucus membranes or skin necrosis: No Has patient had a PCN reaction that required hospitalization No Has patient had a PCN reaction occurring within the last 10 years: Yes If all of the above answers are "NO", then may proceed with Cephalosporin use.   Marland Kitchen Morphine And Related Other (See Comments)    Patient states that it makes her lose her state of mind.   . Demerol [Meperidine] Rash    Patient Measurements: Height: 5' 8.5" (174 cm) Weight: 214 lb 4.6 oz (97.2 kg) IBW/kg (Calculated) : 65.05 Heparin Dosing Weight:   Vital Signs: Temp: 98.2 F (36.8 C) (09/21 0737) Temp Source: Oral (09/21 0737) BP: 130/49 (09/21 0800) Pulse Rate: 41 (09/21 0800)  Labs:  Recent Labs  10/23/16 0053 10/23/16 0105 10/23/16 0501 10/24/16 0535 10/25/16 0552  HGB 13.1 13.3  --   --   --   HCT 38.6 39.0  --   --   --   PLT 355  --   --   --   --   APTT 64*  --   --   --   --   LABPROT 22.2*  --   --  26.0* 23.2*  INR 1.96  --   --  2.40 2.08  CREATININE 1.08* 1.10* 1.11* 1.81* 1.43*  TROPONINI <0.03  --  <0.03  --   --     Estimated Creatinine Clearance: 40.5 mL/min (A) (by C-G formula based on SCr of 1.43 mg/dL (H)).   Medical History: Past Medical History:  Diagnosis Date  . Aortic stenosis   . Cataplexy   . Diastolic heart failure (HCC)   . Essential  hypertension   . GERD (gastroesophageal reflux disease)   . Hiatal hernia   . History of recurrent deep vein thrombosis (DVT)   . Mitral stenosis   . Narcolepsy   . Neuropathy   . Type 2 diabetes mellitus (HCC)     Medications:  Prescriptions Prior to Admission  Medication Sig Dispense Refill Last Dose  . BYDUREON 2 MG PEN Inject 2 mg into the skin once a week.  5 10/19/2016  . celecoxib (CELEBREX) 200 MG capsule Take 200 mg by mouth daily. For arthritis   10/22/2016 at Unknown time  . Cholecalciferol (VITAMIN D3) 3000 UNITS TABS Take 1 capsule by mouth daily.   10/22/2016 at Unknown time  . clomiPRAMINE (ANAFRANIL) 25 MG capsule Take 25 mg by mouth 4 (four) times daily.    10/22/2016 at Unknown time  . clonazePAM (KLONOPIN) 1 MG tablet Take 1 mg by mouth at bedtime.    10/22/2016 at Unknown time  . Coenzyme Q10 (CO Q10) 200 MG CAPS Take 1 capsule by mouth daily.   10/22/2016 at Unknown time  . diltiazem (CARDIZEM CD) 120 MG 24 hr capsule Take 120 mg by mouth daily.   10/22/2016 at Unknown time  . DULoxetine (CYMBALTA) 60 MG capsule Take 60 mg by mouth daily.  10/22/2016 at Unknown time  . Ferrous Fumarate (FERROCITE) 324 (106 FE) MG TABS Take 1 capsule by mouth daily.    10/22/2016 at Unknown time  . furosemide (LASIX) 40 MG tablet Take 20 mg by mouth 2 (two) times daily.   5 10/22/2016 at Unknown time  . Hyaluronic Acid-Vitamin C (HYALURONIC ACID PO) Take 100 mg by mouth daily.   10/22/2016 at Unknown time  . insulin glargine (LANTUS) 100 unit/mL SOPN Inject 20 Units into the skin at bedtime.    10/22/2016 at Unknown time  . Magnesium 300 MG CAPS Take 600 mg by mouth daily.   10/22/2016 at Unknown time  . metFORMIN (GLUCOPHAGE) 500 MG tablet Take 500 mg by mouth 2 (two) times daily with a meal.     10/22/2016 at Unknown time  . mirtazapine (REMERON) 15 MG tablet Take 15 mg by mouth at bedtime.     10/22/2016 at Unknown time  . potassium chloride (K-DUR) 10 MEQ tablet Take 1 tablet (10 mEq total) by  mouth daily. 90 tablet 3 10/22/2016 at Unknown time  . Probiotic Product (PROBIOTIC ADVANCED PO) Take 1 capsule by mouth daily.   10/22/2016 at Unknown time  . QUEtiapine (SEROQUEL) 100 MG tablet Take 100 mg by mouth at bedtime.   10/22/2016 at Unknown time  . warfarin (COUMADIN) 5 MG tablet See written post operative Coumadin instructions. (Patient taking differently: Take 2.5-5 mg by mouth every morning. Alternate taking  with 2.5mg  daily) 1 tablet 0 10/22/2016 at Unknown time    Assessment: Head CT (-) acute changes.  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 INR today is therapeutic  Goal of Therapy:  INR 2-3   Plan:  Warfarin  PO x1 Daily PT/INR Monitor for signs and symptoms of bleeding.  Telicia Hodgkiss Poteet 10/25/2016,10:35 AM

## 2016-10-25 NOTE — Progress Notes (Signed)
Patient refused CPAP for the night. Patient wearing oxygen set at 2lpm with Sp02=97%. Patient does not wear CPAP at home.

## 2016-10-25 NOTE — Progress Notes (Signed)
Progress Note  Patient Name: Rebecca [name] Date of Encounter: 10/25/2016  Primary Cardiologist: Dr. Prentice Docker  Subjective   Patient had breakfast. Reports no breathlessness at rest. No chest pain or palpitations. She is complaining of right shoulder discomfort and some difficulty raising her right arm after her recent fall.  Inpatient Medications    Scheduled Meds: . amLODipine  5 mg Oral Daily  . aspirin EC  81 mg Oral Daily  . clomiPRAMINE  25 mg Oral QID  . DULoxetine  60 mg Oral Daily  . furosemide  60 mg Intravenous BID  . mirtazapine  15 mg Oral QHS  . QUEtiapine  100 mg Oral QHS  . sodium chloride flush  3 mL Intravenous Q12H  . Warfarin - Pharmacist Dosing Inpatient   Does not apply Q24H   Continuous Infusions: . sodium chloride     PRN Meds: sodium chloride, acetaminophen, albuterol, clonazePAM, hydrALAZINE, ondansetron (ZOFRAN) IV, sodium chloride flush   Vital Signs    Vitals:   10/25/16 0000 10/25/16 0400 10/25/16 0500 10/25/16 0737  BP: (!) 126/52 (!) 129/43    Pulse: (!) 42 (!) 43    Resp: 20 18    Temp: 98 F (36.7 C) 98.8 F (37.1 C)  98.2 F (36.8 C)  TempSrc: Oral Oral  Oral  SpO2: 90% 91%    Weight:   214 lb 4.6 oz (97.2 kg)   Height:        Intake/Output Summary (Last 24 hours) at 10/25/16 0843 Last data filed at 10/25/16 0600  Gross per 24 hour  Intake              480 ml  Output             1550 ml  Net            -1070 ml   Filed Weights   10/23/16 2115 10/24/16 0500 10/25/16 0500  Weight: 212 lb 15.4 oz (96.6 kg) 212 lb 8.4 oz (96.4 kg) 214 lb 4.6 oz (97.2 kg)    Telemetry    Sinus rhythm with complete heart block, possibly episodes of second-degree type II heart block as well. Personally reviewed.  Physical Exam   GEN: Obese woman. No acute distress.   Neck: No JVD. Cardiac:  Bradycardic regular rhythm, 2/6 systolic murmur and soft diastolic murmur, no rub or gallop.  Respiratory:  Decreased breath sounds without  wheezing. GI: Soft, nontender, bowel sounds present. MS: Mild leg edema; No deformity. Neuro:  Nonfocal. Psych: Alert and oriented x 3. Normal affect.  Labs    Chemistry Recent Labs Lab 10/23/16 0053  10/23/16 0501 10/24/16 0535 10/25/16 0552  NA 134*  < > 136 135 134*  K 3.9  < > 4.0 4.0 4.1  CL 100*  < > 103 97* 99*  CO2 23  --  GLUCOSE 132*  < > 135* 128* 146*  BUN 25*  < > 26* 32* 40*  CREATININE 1.08*  < > 1.11* 1.81* 1.43*  CALCIUM 9.1  --  9.0 8.7* 8.7*  PROT 7.7  --   --   --   --   ALBUMIN 3.3*  --   --   --   --   AST 21  --   --   --   --   ALT 19  --   --   --   --   ALKPHOS 60  --   --   --   --  BILITOT 0.5  --   --   --   --   GFRNONAA 48*  --  47* 26* 34*  GFRAA 56*  --  54* 30* 40*  ANIONGAP 11  --  < > = values in this interval not displayed.   Hematology Recent Labs Lab 10/23/16 0053 10/23/16 0105  WBC 10.2  --   RBC 4.54  --   HGB 13.1 13.3  HCT 38.6 39.0  MCV 85.0  --   MCH 28.9  --   MCHC 33.9  --   RDW 14.1  --   PLT 355  --     Cardiac Enzymes Recent Labs Lab 10/23/16 0053 10/23/16 0501  TROPONINI <0.03 <0.03    Recent Labs Lab 10/23/16 0103  TROPIPOC 0.01    Radiology    Chest x-ray 10/23/2016: FINDINGS: Shallow inspiration. Cardiac enlargement with pulmonary vascular congestion. Diffuse interstitial pattern to the lungs likely represents edema. No blunting of costophrenic angles. No pneumothorax. Calcified and tortuous aorta. Probable esophageal hiatal hernia behind the heart.  IMPRESSION: Cardiac enlargement with pulmonary vascular congestion and bilateral interstitial edema.  Cardiac Studies   Echocardiogram 10/23/2016: Study Conclusions  - Left ventricle: The cavity size was normal. Wall thickness was   increased in a pattern of moderate LVH. Systolic function was   normal. The estimated ejection fraction was in the range of 60%   to 65%. Wall motion was normal; there were no regional  wall   motion abnormalities. Doppler parameters are consistent with   abnormal left ventricular relaxation (grade 1 diastolic   dysfunction). - Aortic valve: Severely calcified annulus. Trileaflet; moderately   thickened leaflets. There was mild stenosis. There was moderate   regurgitation. Mean gradient (S): 12 mm Hg. Valve area (VTI):   1.65 cm^2. - Mitral valve: Severely calcified annulus. Moderately thickened   leaflets . Mean gradient for mitral stenosis is in the severe   range at 10 mmHg. The MVA by PHT is closer to the moderate range   1.5 cm squared. There was mild regurgitation. Mean gradient (D):   10 mm Hg. - Left atrium: The atrium was severely dilated. - Technically adequate study.  Patient Profile     76 y.o. female with a history of recurrent DVT on chronic Coumadin, essential hypertension, chronic diastolic heart failure, heart disease including mild aortic stenosis and at least moderate mitral stenosis, now presenting with evidence of complete heart block by ECG symptomatic bradycardia.  Assessment & Plan    1. 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 Blood pressure is stable at this time. She was taken off Cardizem CD on admission with no significant change in rhythm.  2. Recurring falls, not clearly associated with syncope. She is complaining of right shoulder discomfort after her most recent fall.  3. Valvular heart disease including mild aortic stenosis and at least moderate mitral stenosis by recent echocardiogram as outlined above. This would also be expected to be associated with conduction system disease.  4. History of recurrent DVT on chronic Coumadin. INR is 2.08.  5. Chronic diastolic heart failure.  Discussed with patient and daughter in room. We have been in communication with the EP service at [facility] and patient is to be transferred there early afternoon today  for further assessment. In the meanwhile will order right shoulder films to exclude any obvious bony abnormality that may need further evaluation. Could  be rotator cuff problem and she may need to have MRI at some point. Anticipate that she will need PT assessment since she has been fairly debilitated at baseline recently.  Signed, Nona Dell, MD  10/25/2016, 8:43 AM

## 2016-10-25 NOTE — Progress Notes (Signed)
Report given to [facility], transport is in route.

## 2016-10-26 ENCOUNTER — Inpatient Hospital Stay (HOSPITAL_COMMUNITY): Payer: Medicare Other

## 2016-10-26 LAB — CBC
HEMATOCRIT: 43.4 % (ref 36.0–46.0)
Hemoglobin: 14.2 g/dL (ref 12.0–15.0)
MCH: 28 pg (ref 26.0–34.0)
MCHC: 32.7 g/dL (ref 30.0–36.0)
MCV: 85.6 fL (ref 78.0–100.0)
PLATELETS: 349 10*3/uL (ref 150–400)
RBC: 5.07 MIL/uL (ref 3.87–5.11)
RDW: 13.9 % (ref 11.5–15.5)
WBC: 11.9 10*3/uL — AB (ref 4.0–10.5)

## 2016-10-26 LAB — BASIC METABOLIC PANEL
ANION GAP: 12 (ref 5–15)
BUN: 34 mg/dL — ABNORMAL HIGH (ref 6–20)
CHLORIDE: 96 mmol/L — AB (ref 101–111)
CO2: 24 mmol/L (ref 22–32)
Calcium: 8.9 mg/dL (ref 8.9–10.3)
Creatinine, Ser: 1.12 mg/dL — ABNORMAL HIGH (ref 0.44–1.00)
GFR calc non Af Amer: 46 mL/min — ABNORMAL LOW (ref >60)
GFR, EST AFRICAN AMERICAN: 53 mL/min — AB (ref >60)
Glucose, Bld: 165 mg/dL — ABNORMAL HIGH (ref 65–99)
Potassium: 4.9 mmol/L (ref 3.5–5.1)
Sodium: 132 mmol/L — ABNORMAL LOW (ref 135–145)

## 2016-10-26 LAB — PROTIME-INR
INR: 1.55
PROTHROMBIN TIME: 18.4 s — AB (ref 11.4–15.2)

## 2016-10-26 MED ORDER — AMLODIPINE BESYLATE 10 MG PO TABS
10.0000 mg | ORAL_TABLET | Freq: Every day | ORAL | Status: DC
  Administered 2016-10-27 – 2016-10-28 (×2): 10 mg via ORAL
  Filled 2016-10-26 (×2): qty 1

## 2016-10-26 NOTE — Evaluation (Signed)
Physical Therapy Evaluation Patient Details Name: Rebecca [name] MRN: [id number] DOB: 1941-01-19 Today's Date: 10/26/2016   History of Present Illness  76 y.o. female past medical history of cataplexy, CHF, diabetes, DVT, hypertension, narcolepsy, and neuropathy who presented to the ED with shortness of breath. Testing revealed bradycardia and heart block. She is s/p pacemaker placement.   Clinical Impression  Pt admitted with above diagnosis. Pt currently with functional limitations due to the deficits listed below (see PT Problem List). On eval, pt required max assist bed mobility and +2 max assist transfers.  Mobility limited by pain, weakness and fatigue.  Pt will benefit from skilled PT to increase their independence and safety with mobility to allow discharge to the venue listed below.       Follow Up Recommendations SNF    Equipment Recommendations  None recommended by PT    Recommendations for Other Services       Precautions / Restrictions Precautions Precautions: Fall;ICD/Pacemaker Required Braces or Orthoses: Sling (for comfort, to minimize movement LUE)      Mobility  Bed Mobility Overal bed mobility: Needs Assistance Bed Mobility: Supine to Sit;Sit to Supine     Supine to sit: Max assist Sit to supine: Max assist   General bed mobility comments: increased time and effort to complete. Use of bed pad to scoot to EOB.  Transfers Overall transfer level: Needs assistance   Transfers: Sit to/from Stand;Stand Pivot Transfers Sit to Stand: Max assist (with RW) Stand pivot transfers: +2 physical assistance;Max assist       General transfer comment: sit to stand bedside with RW. Pt unable to attain full upright stance requiring return to sitting EOB. +2 max assist pivot transfer bed <> chair.  Ambulation/Gait             General Gait Details: unable  Stairs            Wheelchair Mobility    Modified Rankin (Stroke Patients Only)       Balance                                              Pertinent Vitals/Pain Pain Assessment: Faces Faces Pain Scale: Hurts a little bit Pain Location: LUE Pain Descriptors / Indicators: Operative site guarding;Grimacing;Guarding Pain Intervention(s): Limited activity within patient's tolerance;Repositioned    Home Living Family/patient expects to be discharged to:: Private residence Living Arrangements: Children Available Help at Discharge: Family;Available PRN/intermittently (Pt alone during th day.) Type of Home: House Home Access: Ramped entrance     Home Layout: One level (one step into pt's living area: bedroom, laundry room, 1/2 bath) Home Equipment: Walker - 2 wheels;Wheelchair - manual;Shower seat;Grab bars - tub/shower;Grab bars - toilet      Prior Function Level of Independence: Needs assistance   Gait / Transfers Assistance Needed: Ambulates with RW. Multi falls  ADL's / Homemaking Assistance Needed: occasional assist with dressing and bathing        Hand Dominance        Extremity/Trunk Assessment   Upper Extremity Assessment Upper Extremity Assessment: Generalized weakness    Lower Extremity Assessment Lower Extremity Assessment: Generalized weakness    Cervical / Trunk Assessment Cervical / Trunk Assessment: Kyphotic  Communication   Communication: No difficulties  Cognition Arousal/Alertness: Awake/alert Behavior During Therapy: WFL for tasks assessed/performed Overall Cognitive Status: Within Functional Limits for tasks  assessed                                        General Comments      Exercises     Assessment/Plan    PT Assessment Patient needs continued PT services  PT Problem List Decreased strength;Decreased activity tolerance;Decreased balance;Decreased mobility;Pain;Cardiopulmonary status limiting activity       PT Treatment Interventions DME instruction;Gait training;Functional mobility  training;Balance training;Therapeutic exercise;Therapeutic activities;Patient/family education    PT Goals (Current goals can be found in the Care Plan section)  Acute Rehab PT Goals Patient Stated Goal: home PT Goal Formulation: With patient/family Time For Goal Achievement: 11/09/16 Potential to Achieve Goals: Fair    Frequency Min 3X/week   Barriers to discharge        Co-evaluation               AM-PAC PT "6 Clicks" Daily Activity  Outcome Measure Difficulty turning over in bed (including adjusting bedclothes, sheets and blankets)?: A Lot Difficulty moving from lying on back to sitting on the side of the bed? : Unable Difficulty sitting down on and standing up from a chair with arms (e.g., wheelchair, bedside commode, etc,.)?: Unable Help needed moving to and from a bed to chair (including a wheelchair)?: A Lot Help needed walking in hospital room?: Total Help needed climbing 3-5 steps with a railing? : Total 6 Click Score: 8    End of Session Equipment Utilized During Treatment: Gait belt;Oxygen Activity Tolerance: Patient limited by fatigue Patient left: in bed;with call bell/phone within reach;with family/visitor present;with nursing/sitter in room Nurse Communication: Mobility status PT Visit Diagnosis: Other abnormalities of gait and mobility (R26.89);Pain;Muscle weakness (generalized) (M62.81) Pain - Right/Left: Left Pain - part of body: Shoulder    Time: 3403-5248 PT Time Calculation (min) (ACUTE ONLY): 28 min   Charges:   PT Evaluation $PT Eval Moderate Complexity: 1 Mod PT Treatments $Therapeutic Activity: 8-22 mins   PT G Codes:        Aida Raider, PT  Office # [phone] Pager [phone]   Ilda Foil 10/26/2016, 1:58 PM

## 2016-10-26 NOTE — Progress Notes (Addendum)
Progress Note  Patient Name: Rebecca [name] Date of Encounter: 10/26/2016  Primary Cardiologist: Junius Argyle, MD  Subjective   Called by RN to eval pt b/c of pocket hematoma that developed overnight.  Pt is tender over pacer site w/ swelling.  She is groggy this am and very weak - unable to sit up in bed w/o complete assistance.  Biggest complaint is that she's hungry.  Inpatient Medications    Scheduled Meds: . amLODipine  5 mg Oral Daily  . aspirin EC  81 mg Oral Daily  . clomiPRAMINE  25 mg Oral QID  . DULoxetine  60 mg Oral Daily  . furosemide  60 mg Intravenous BID  . mirtazapine  15 mg Oral QHS  . mupirocin ointment   Nasal BID  . QUEtiapine  100 mg Oral QHS  . sodium chloride flush  3 mL Intravenous Q12H  . warfarin  5 mg Oral Once  . Warfarin - Pharmacist Dosing Inpatient   Does not apply Q24H   Continuous Infusions: . sodium chloride     PRN Meds: sodium chloride, acetaminophen, albuterol, clonazePAM, hydrALAZINE, ondansetron (ZOFRAN) IV, sodium chloride flush   Vital Signs    Vitals:   10/25/16 1640 10/25/16 1645 10/25/16 2050 10/26/16 0500  BP: (!) 160/72 (!) 162/70 (!) 167/66 (!) 157/76  Pulse: 83 83 73 81  Resp:   16 (!) 22  Temp:   (!) 97.4 F (36.3 C) 98 F (36.7 C)  TempSrc:   Oral Oral  SpO2: 98% 100% 100% 93%  Weight:    195 lb 6.4 oz (88.6 kg)  Height:        Intake/Output Summary (Last 24 hours) at 10/26/16 0822 Last data filed at 10/26/16 0016  Gross per 24 hour  Intake              222 ml  Output             1050 ml  Net             -828 ml   Filed Weights   10/24/16 0500 10/25/16 0500 10/26/16 0500  Weight: 212 lb 8.4 oz (96.4 kg) 214 lb 4.6 oz (97.2 kg) 195 lb 6.4 oz (88.6 kg)    Physical Exam   GEN: obese, in no acute distress.  HEENT: Grossly normal.  Neck: Supple, obese, difficult to gauge jvp.  No carotid bruits, or masses. Cardiac: Irreg, 2/6 syst murmur @ apex, no rubs, or gallops. No clubbing, cyanosis, edema.   Radials/DP/PT 2+ and equal bilaterally.  Respiratory:  Respirations regular and unlabored, bibasilar crackles. GI: Obese, soft, nontender, nondistended, BS + x 4. MS: no deformity or atrophy. Skin: warm and dry, no rash. Neuro:  Strength and sensation are intact. Psych: AAOx3.  Normal affect.  Labs    Chemistry Recent Labs Lab 10/23/16 0053  10/23/16 0501 10/24/16 0535 10/25/16 0552  NA 134*  < > 136 135 134*  K 3.9  < > 4.0 4.0 4.1  CL 100*  < > 103 97* 99*  CO2 23  --  GLUCOSE 132*  < > 135* 128* 146*  BUN 25*  < > 26* 32* 40*  CREATININE 1.08*  < > 1.11* 1.81* 1.43*  CALCIUM 9.1  --  9.0 8.7* 8.7*  PROT 7.7  --   --   --   --   ALBUMIN 3.3*  --   --   --   --  AST 21  --   --   --   --   ALT 19  --   --   --   --   ALKPHOS 60  --   --   --   --   BILITOT 0.5  --   --   --   --   GFRNONAA 48*  --  47* 26* 34*  GFRAA 56*  --  54* 30* 40*  ANIONGAP 11  --  10 11 9   < > = values in this interval not displayed.   Hematology Recent Labs Lab 10/23/16 0053 10/23/16 0105  WBC 10.2  --   RBC 4.54  --   HGB 13.1 13.3  HCT 38.6 39.0  MCV 85.0  --   MCH 28.9  --   MCHC 33.9  --   RDW 14.1  --   PLT 355  --     Cardiac Enzymes Recent Labs Lab 10/23/16 0053 10/23/16 0501  TROPONINI <0.03 <0.03    Recent Labs Lab 10/23/16 0103  TROPIPOC 0.01      Radiology    Dg Chest 2 View  Result Date: 10/26/2016 CLINICAL DATA:  Recent pacemaker placement EXAM: CHEST  2 VIEW COMPARISON:  10/23/2016 FINDINGS: Cardiac shadow is mildly enlarged. Pacing device is noted. No pneumothorax is seen. Vascular congestion and interstitial edema is noted although improved from the prior exam. No bony abnormality is seen. IMPRESSION: No pneumothorax following pacemaker placement. Improving interstitial edema. Electronically Signed   By: Alcide Clever M.D.   On: 10/26/2016 07:47   Dg Shoulder Right  Result Date: 10/25/2016 CLINICAL DATA:  Pain following fall EXAM: RIGHT  SHOULDER - 2+ VIEW COMPARISON:  None. FINDINGS: Frontal and Y scapular images were obtained. No evident fracture or dislocation. There is mild narrowing of the glenohumeral joint. The acromioclavicular joint appears normal. No erosive change or intra-articular calcification. Visualized right lung is clear. IMPRESSION: Narrowing of the glenohumeral joint. Acromioclavicular joint unremarkable. No fracture or dislocation. Electronically Signed   By: Bretta Bang III M.D.   On: 10/25/2016 10:45    Telemetry    Mostly V paced, underlying sinus, occasional AV paced. - Personally Reviewed  Cardiac Studies   2D Echocardiogram 9.19.2018  Study Conclusions  - Left ventricle: The cavity size was normal. Wall thickness was   increased in a pattern of moderate LVH. Systolic function was   normal. The estimated ejection fraction was in the range of 60%   to 65%. Wall motion was normal; there were no regional wall   motion abnormalities. Doppler parameters are consistent with   abnormal left ventricular relaxation (grade 1 diastolic   dysfunction). - Aortic valve: Severely calcified annulus. Trileaflet; moderately   thickened leaflets. There was mild stenosis. There was moderate   regurgitation. Mean gradient (S): 12 mm Hg. Valve area (VTI):   1.65 cm^2. - Mitral valve: Severely calcified annulus. Moderately thickened   leaflets . Mean gradient for mitral stenosis is in the severe   range at 10 mmHg. The MVA by PHT is closer to the moderate range   1.5 cm squared. There was mild regurgitation. Mean gradient (D):   10 mm Hg. - Left atrium: The atrium was severely dilated. - Technically adequate study.  Patient Profile     76 y.o. 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  Assessment & Plan    1.  High Grade AV  Block: s/p PPM.  Pacing appropriately.  Pocket hematoma noted in setting of chronic coumadin.  F/u INR and cbc this am.  Pressure dsg applied.  2.  Pacer pocket hematoma:  As above.  Pressure dsg applied.  F/u cbc/inr.  Hold coumadin x 2 days.   3.  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  Holding coumadin in the setting of #2.  4. Acute on chronic diast CHF:  Pt still with crackles on exam.  Body habitus otw makes judgement of volume difficult.  Minus 3.5L since admission.  Wt listed as down to 195 lbs, though ? Accuracy of bedscale - was 214 lbs yesterday.  Cont IV lasix today.  Creat stable yesterday.  F/u this am.  BP up.  Will push amlodipine to 10 mg daily.  Now that she is paced, carvedilol may be a good next option.  5.  Hypertensive Heart Dzs:  See #4.  6.  Mod-Sev Mitral Stenosis:  Echo 9.19 showed Sev MS by mean gradient of 10 mmHg but MVA by PHT closer to mod range @ 1.5cm^2.  Will need further outpt f/u.  7.  Deconditioning:  Unable to sit up this AM without complete assistance.  Will ask PT to see.  Signed, Nicolasa Ducking, NP  10/26/2016, 8:22 AM    EP Attending  Patient seen and examined. Agree with above. She will need to hold her anti-coagulation for at least 2 days. I have asked her to get out of bed and attempt to ambulate. If able then she can go home. If not, then would ask PT to see regarding SNF. I have reviewed her CXR and her device has been interogated under my direct supervision. Normal function. She has complete heart block with no escape today.  Leonia Reeves.D.

## 2016-10-26 NOTE — Progress Notes (Signed)
ANTICOAGULATION CONSULT NOTE  Pharmacy Consult for Warfarin (on hold) Indication: DVT (Chronic)   Patient Measurements: Height: 5' 8.5" (174 cm) Weight: 195 lb 6.4 oz (88.6 kg) IBW/kg (Calculated) : 65.05 Heparin Dosing Weight:   Vital Signs: Temp: 98 F (36.7 C) (09/22 0500) Temp Source: Oral (09/22 0500) BP: 157/76 (09/22 0500) Pulse Rate: 81 (09/22 0500)  Labs:  Recent Labs  10/24/16 0535 10/25/16 0552 10/26/16 0741 10/26/16 1123  HGB  --   --   --  14.2  HCT  --   --   --  43.4  PLT  --   --   --  349  LABPROT 26.0* 23.2* 18.4*  --   INR 2.40 2.08 1.55  --   CREATININE 1.81* 1.43* 1.12*  --     Estimated Creatinine Clearance: 49.5 mL/min (A) (by C-G formula based on SCr of 1.12 mg/dL (H)).    Assessment: Development of pacer pocket hematoma overnight, per cardiology warfarin is to be held for 2 days. INR today is 1.55. CBC is within normal limits and stable.   Plan:  Warfarin on hold Monitor for plan for warfarin and pharmacy consult to resume warfarin Monitor for signs and symptoms of bleeding  Blake Divine, Pharm.D. PGY1 Pharmacy Resident 10/26/2016 1:06 PM Main Pharmacy: [phone]

## 2016-10-27 DIAGNOSIS — N179 Acute kidney failure, unspecified: Secondary | ICD-10-CM

## 2016-10-27 DIAGNOSIS — D500 Iron deficiency anemia secondary to blood loss (chronic): Secondary | ICD-10-CM

## 2016-10-27 DIAGNOSIS — Z950 Presence of cardiac pacemaker: Secondary | ICD-10-CM

## 2016-10-27 LAB — CBC
HCT: 29.9 % — ABNORMAL LOW (ref 36.0–46.0)
Hemoglobin: 9.9 g/dL — ABNORMAL LOW (ref 12.0–15.0)
MCH: 28.5 pg (ref 26.0–34.0)
MCHC: 33.1 g/dL (ref 30.0–36.0)
MCV: 86.2 fL (ref 78.0–100.0)
PLATELETS: 286 10*3/uL (ref 150–400)
RBC: 3.47 MIL/uL — ABNORMAL LOW (ref 3.87–5.11)
RDW: 16.4 % — AB (ref 11.5–15.5)
WBC: 7.8 10*3/uL (ref 4.0–10.5)

## 2016-10-27 LAB — BASIC METABOLIC PANEL
ANION GAP: 9 (ref 5–15)
BUN: 41 mg/dL — ABNORMAL HIGH (ref 6–20)
CALCIUM: 9.1 mg/dL (ref 8.9–10.3)
CO2: 25 mmol/L (ref 22–32)
Chloride: 106 mmol/L (ref 101–111)
Creatinine, Ser: 3.01 mg/dL — ABNORMAL HIGH (ref 0.44–1.00)
GFR, EST AFRICAN AMERICAN: 16 mL/min — AB (ref >60)
GFR, EST NON AFRICAN AMERICAN: 14 mL/min — AB (ref >60)
Glucose, Bld: 108 mg/dL — ABNORMAL HIGH (ref 65–99)
Potassium: 3.5 mmol/L (ref 3.5–5.1)
Sodium: 140 mmol/L (ref 135–145)

## 2016-10-27 LAB — HEMOGLOBIN AND HEMATOCRIT, BLOOD
HEMATOCRIT: 41.4 % (ref 36.0–46.0)
HEMOGLOBIN: 14.1 g/dL (ref 12.0–15.0)

## 2016-10-27 LAB — PROTIME-INR
INR: 1.07
PROTHROMBIN TIME: 13.8 s (ref 11.4–15.2)

## 2016-10-27 MED ORDER — CARVEDILOL 3.125 MG PO TABS
3.1250 mg | ORAL_TABLET | Freq: Two times a day (BID) | ORAL | Status: DC
  Administered 2016-10-27 – 2016-10-28 (×2): 3.125 mg via ORAL
  Filled 2016-10-27 (×2): qty 1

## 2016-10-27 MED ORDER — SODIUM CHLORIDE 0.9 % IV SOLN
INTRAVENOUS | Status: AC
  Administered 2016-10-27: 09:00:00 via INTRAVENOUS

## 2016-10-27 NOTE — Progress Notes (Signed)
Progress Note  Patient Name: Rebecca [name] Date of Encounter: 10/27/2016  Primary Cardiologist: Junius Argyle, MD    Subjective   A little more energy today.  Left upper chest pacer site is tender.  No dyspnea - lying flat.  Inpatient Medications    Scheduled Meds: . amLODipine  10 mg Oral Daily  . aspirin EC  81 mg Oral Daily  . clomiPRAMINE  25 mg Oral QID  . DULoxetine  60 mg Oral Daily  . mirtazapine  15 mg Oral QHS  . mupirocin ointment   Nasal BID  . QUEtiapine  100 mg Oral QHS  . sodium chloride flush  3 mL Intravenous Q12H  . Warfarin - Pharmacist Dosing Inpatient   Does not apply Q24H   Continuous Infusions: . sodium chloride     PRN Meds: sodium chloride, acetaminophen, albuterol, clonazePAM, hydrALAZINE, ondansetron (ZOFRAN) IV, sodium chloride flush   Vital Signs    Vitals:   10/26/16 0500 10/26/16 1434 10/26/16 2125 10/27/16 0530  BP: (!) 157/76 (!) 151/79 (!) 146/54 (!) 151/73  Pulse: 81 83 89 83  Resp: (!) Temp: 98 F (36.7 C) 98.2 F (36.8 C) 97.8 F (36.6 C) 97.9 F (36.6 C)  TempSrc: Oral Oral Oral Oral  SpO2: 93% 100% 93% 94%  Weight: 195 lb 6.4 oz (88.6 kg)   196 lb (88.9 kg)  Height:        Intake/Output Summary (Last 24 hours) at 10/27/16 0840 Last data filed at 10/26/16 2203  Gross per 24 hour  Intake                0 ml  Output              900 ml  Net             -900 ml   Filed Weights   10/25/16 0500 10/26/16 0500 10/27/16 0530  Weight: 214 lb 4.6 oz (97.2 kg) 195 lb 6.4 oz (88.6 kg) 196 lb (88.9 kg)    Physical Exam   GEN: obese, in no acute distress.  HEENT: Grossly normal.  Neck: Supple, obese, difficult to gauge jvp, no carotid bruits, or masses. Cardiac: IR, IR, 2/6 syst murmur @ apex, no rubs, or gallops. No clubbing, cyanosis, edema.  Radials/DP/PT 2+ and equal bilaterally.  Left upper chest wall is soft, swollen, ecchymotic, and tender.  No bleeding. Respiratory:  Respirations regular and unlabored,  clear to auscultation bilaterally. GI: Soft, nontender, nondistended, BS + x 4. MS: no deformity or atrophy. Skin: warm and dry, no rash. Neuro:  Strength and sensation are intact. Psych: AAOx3.  Normal affect.  Labs    Chemistry Recent Labs Lab 10/23/16 0053  10/25/16 0552 10/26/16 0741 10/27/16 0220  NA 134*  < > 134* 132* 140  K 3.9  < > 4.1 4.9 3.5  CL 100*  < > 99* 96* 106  CO2 23  < > GLUCOSE 132*  < > 146* 165* 108*  BUN 25*  < > 40* 34* 41*  CREATININE 1.08*  < > 1.43* 1.12* 3.01*  CALCIUM 9.1  < > 8.7* 8.9 9.1  PROT 7.7  --   --   --   --   ALBUMIN 3.3*  --   --   --   --   AST 21  --   --   --   --   ALT 19  --   --   --   --  ALKPHOS 60  --   --   --   --   BILITOT 0.5  --   --   --   --   GFRNONAA 48*  < > 34* 46* 14*  GFRAA 56*  < > 40* 53* 16*  ANIONGAP 11  < > < > = values in this interval not displayed.   Hematology Recent Labs Lab 10/23/16 0053 10/23/16 0105 10/26/16 1123 10/27/16 0220  WBC 10.2  --  11.9* 7.8  RBC 4.54  --  5.07 3.47*  HGB 13.1 13.3 14.2 9.9*  HCT 38.6 39.0 43.4 29.9*  MCV 85.0  --  85.6 86.2  MCH 28.9  --  28.0 28.5  MCHC 33.9  --  32.7 33.1  RDW 14.1  --  13.9 16.4*  PLT 355  --  349 286    Cardiac Enzymes Recent Labs Lab 10/23/16 0053 10/23/16 0501  TROPONINI <0.03 <0.03    Recent Labs Lab 10/23/16 0103  TROPIPOC 0.01     Lab Results  Component Value Date   INR 1.07 10/27/2016   INR 1.55 10/26/2016   INR 2.08 10/25/2016     Radiology    Dg Chest 2 View  Result Date: 10/26/2016 CLINICAL DATA:  Recent pacemaker placement EXAM: CHEST  2 VIEW COMPARISON:  10/23/2016 FINDINGS: Cardiac shadow is mildly enlarged. Pacing device is noted. No pneumothorax is seen. Vascular congestion and interstitial edema is noted although improved from the prior exam. No bony abnormality is seen. IMPRESSION: No pneumothorax following pacemaker placement. Improving interstitial edema. Electronically Signed    By: Alcide Clever M.D.   On: 10/26/2016 07:47   Dg Shoulder Right  Result Date: 10/25/2016 CLINICAL DATA:  Pain following fall EXAM: RIGHT SHOULDER - 2+ VIEW COMPARISON:  None. FINDINGS: Frontal and Y scapular images were obtained. No evident fracture or dislocation. There is mild narrowing of the glenohumeral joint. The acromioclavicular joint appears normal. No erosive change or intra-articular calcification. Visualized right lung is clear. IMPRESSION: Narrowing of the glenohumeral joint. Acromioclavicular joint unremarkable. No fracture or dislocation. Electronically Signed   By: Bretta Bang III M.D.   On: 10/25/2016 10:45    Telemetry    V paced, occas AV pacing - Personally Reviewed  Cardiac Studies   2D Echocardiogram 9.19.2018  Study Conclusions  - Left ventricle: The cavity size was normal. Wall thickness was increased in a pattern of moderate LVH. Systolic function was normal. The estimated ejection fraction was in the range of 60% to 65%. Wall motion was normal; there were no regional wall motion abnormalities. Doppler parameters are consistent with abnormal left ventricular relaxation (grade 1 diastolic dysfunction). - Aortic valve: Severely calcified annulus. Trileaflet; moderately thickened leaflets. There was mild stenosis. There was moderate regurgitation. Mean gradient (S): 12 mm Hg. Valve area (VTI): 1.65 cm^2. - Mitral valve: Severely calcified annulus. Moderately thickened leaflets . Mean gradient for mitral stenosis is in the severe range at 10 mmHg. The MVA by PHT is closer to the moderate range 1.5 cm squared. There was mild regurgitation. Mean gradient (D): 10 mm Hg. - Left atrium: The atrium was severely dilated. - Technically adequate study.  Patient Profile     76 y.o. 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  Assessment & Plan    1.  High Grade AV Block:  S/p PPM.  Pacing appropriately.  2.  Pacer pocket hematoma: H/H down to 9.9 and 29.9 this am.  Area tender, ecchymotic.  INR 1.07 this am.  F/u H/H later today and in AM.  3.  Acute renal failure:  Creat up to 3.01 this AM in setting of diuresis and anemia.  Holding lasix.  Will gently hydrate.  4.  Acute on chronic diastolic CHF:  Minus 4.4L since admission.  Wt stable @ 196.  Creat up.  Holding lasix.  Will gently hydrate today in setting of AKI.  5.  HTN Heart Dzs: BP cont to trend up. Will add carvedilol.  6.  Mod-Sev Mitral Stenosis: Echo 9.19 showed Sev MS by mean gradient of 10 mmHg but MVA by PHT closer to mod range @ 1.5cm^2.  Will need further outpt f/u.  7.  Deconditioning:  Seen by PT. Appreciate input  rec SNF.  Pt would like to discuss w/ son, whom she lives with.  PCP prev rec HHPT due to deconditioning.  8.  H/O DVT:  Coumadin on hold in setting of #1.  SCD's.  9.  ABL Anemia:  In setting of #2.  H/H later today.  No role for transfusion @ this time.  Coumadin on hold.  Signed, Nicolasa Ducking, NP  10/27/2016, 8:40 AM     Attending note:  Patient seen and examined. Reviewed chart and interval note by Dr. Ladona Ridgel. Case discussed with Mr. Brion Aliment NP. Patient feels a letter better today but wants to go home. PT evaluation recommends SNF with her significant deconditioning, and this seems to be the most reasonable approach. She wants to talk this over with her son. From the perspective of her pacer pocket hematoma, serial hemoglobin being checked with Coumadin held and INR 1.07. Hemoglobin 9.9 represents fairly significant drop from yesterday. She has SCDs in place for DVT prophylaxis. EP service will continue to follow.  Jonelle Sidle, M.D., F.A.C.C.   For questions or updates, please contact   Please consult [email or web address] for contact info under  Cardiology/STEMI.

## 2016-10-28 ENCOUNTER — Encounter (HOSPITAL_COMMUNITY): Payer: Self-pay | Admitting: *Deleted

## 2016-10-28 ENCOUNTER — Ambulatory Visit: Payer: Medicare Other

## 2016-10-28 ENCOUNTER — Inpatient Hospital Stay
Admission: RE | Admit: 2016-10-28 | Discharge: 2016-11-08 | Disposition: A | Discharge status: Home / Self Care | Payer: Medicare Other | Source: Ambulatory Visit | Attending: Internal Medicine | Admitting: Internal Medicine

## 2016-10-28 DIAGNOSIS — J810 Acute pulmonary edema: Secondary | ICD-10-CM

## 2016-10-28 DIAGNOSIS — I5033 Acute on chronic diastolic (congestive) heart failure: Secondary | ICD-10-CM

## 2016-10-28 LAB — BASIC METABOLIC PANEL
ANION GAP: 10 (ref 5–15)
Anion gap: 8 (ref 5–15)
BUN: 30 mg/dL — ABNORMAL HIGH (ref 6–20)
BUN: 30 mg/dL — ABNORMAL HIGH (ref 6–20)
CHLORIDE: 98 mmol/L — AB (ref 101–111)
CO2: 27 mmol/L (ref 22–32)
CO2: 28 mmol/L (ref 22–32)
CREATININE: 1.07 mg/dL — AB (ref 0.44–1.00)
Calcium: 9.1 mg/dL (ref 8.9–10.3)
Calcium: 9 mg/dL (ref 8.9–10.3)
Chloride: 96 mmol/L — ABNORMAL LOW (ref 101–111)
Creatinine, Ser: 0.98 mg/dL (ref 0.44–1.00)
GFR calc Af Amer: >60 mL/min (ref >60)
GFR calc non Af Amer: 49 mL/min — ABNORMAL LOW (ref >60)
GFR, EST AFRICAN AMERICAN: 57 mL/min — AB (ref >60)
GFR, EST NON AFRICAN AMERICAN: 54 mL/min — AB (ref >60)
GLUCOSE: 194 mg/dL — AB (ref 65–99)
Glucose, Bld: 198 mg/dL — ABNORMAL HIGH (ref 65–99)
POTASSIUM: 3.9 mmol/L (ref 3.5–5.1)
Potassium: 4 mmol/L (ref 3.5–5.1)
Sodium: 133 mmol/L — ABNORMAL LOW (ref 135–145)
Sodium: 134 mmol/L — ABNORMAL LOW (ref 135–145)

## 2016-10-28 LAB — CBC
HEMATOCRIT: 37.8 % (ref 36.0–46.0)
HEMOGLOBIN: 12.8 g/dL (ref 12.0–15.0)
MCH: 28.2 pg (ref 26.0–34.0)
MCHC: 33.9 g/dL (ref 30.0–36.0)
MCV: 83.3 fL (ref 78.0–100.0)
Platelets: 349 10*3/uL (ref 150–400)
RBC: 4.54 MIL/uL (ref 3.87–5.11)
RDW: 13.6 % (ref 11.5–15.5)
WBC: 9.8 10*3/uL (ref 4.0–10.5)

## 2016-10-28 LAB — PROTIME-INR
INR: 1.28
Prothrombin Time: 15.9 seconds — ABNORMAL HIGH (ref 11.4–15.2)

## 2016-10-28 MED ORDER — CARVEDILOL 3.125 MG PO TABS
3.1250 mg | ORAL_TABLET | Freq: Two times a day (BID) | ORAL | 6 refills | Status: DC
Start: 2016-10-28 — End: 2017-05-22

## 2016-10-28 MED ORDER — DOCUSATE SODIUM 100 MG PO CAPS: 100.0000 mg | ORAL_CAPSULE | Freq: Every day | ORAL | Status: DC | PRN

## 2016-10-28 MED ORDER — CLOMIPRAMINE HCL 25 MG PO CAPS
25.0000 mg | ORAL_CAPSULE | Freq: Four times a day (QID) | ORAL | Status: DC
  Filled 2016-10-28 (×2): qty 1

## 2016-10-28 MED ORDER — WARFARIN SODIUM 5 MG PO TABS
2.5000 mg | ORAL_TABLET | Freq: Every morning | ORAL | Status: DC
Start: 2016-10-28 — End: 2016-10-29

## 2016-10-28 NOTE — Progress Notes (Signed)
Patient will discharge to [facility]. Anticipated discharge date: 10/28/16 Family notified: Robet Leu, son Transportation by: PTAR  Nurse to call report to [phone].   CSW signing off.  Abigail Butts, LCSWA  Clinical Social Worker

## 2016-10-28 NOTE — Clinical Social Work Note (Signed)
Clinical Social Work Assessment  Patient Details  Name: Rebecca [name] MRN: [id number] Date of Birth: Mar 23, 1940  Date of referral:  10/28/16               Reason for consult:  Facility Placement                Permission sought to share information with:  Family Supports, Customer service manager Permission granted to share information::  Yes, Verbal Permission Granted  Name::     Risk manager::  SNFs  Relationship::  son  Contact Information:  [phone]  Housing/Transportation Living arrangements for the past 2 months:  [place] of Information:  Patient, Adult Children Patient Interpreter Needed:  None Criminal Activity/Legal Involvement Pertinent to Current Situation/Hospitalization:  No - Comment as needed Significant Relationships:  Other Family Members, Adult Children Lives with:  Adult Children Do you feel safe going back to the place where you live?  Yes Need for family participation in patient care:  No (Coment)  Care giving concerns: Patient from home with son. Patient unable to ambulate, which is not baseline. PT recommending SNF.   Social Worker assessment / plan: CSW met with patient and granddaughter, [name], at bedside. Granddaughter received call from patient's son, Lanny Hurst, during assessment and placed the call on speaker. Patient stated she does not want to go to SNF but knows that she needs to. Granddaughter and son insisted patient go to SNF, as they are unable to care for her and lift her at home. Family prefer [facility] in [place], agreeable to [facility] in [place], [place], [place]. CSW sent out initial referrals. CSW will follow and support with SNF placement.  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  Patient/Family's Understanding of and Emotional Response to Diagnosis, Current Treatment, and Prognosis: Patient at times during conversation understanding of need for SNF and at times stating she would go home if she couldn't be placed at [facility]. Granddaughter and son with good understanding of patient need for rehab and hopeful for placement.  Emotional Assessment Appearance:  Appears stated age Attitude/Demeanor/Rapport:  Other (appropriate) Affect (typically observed):  In denial, Frustrated Orientation:  Oriented to Self, Oriented to Situation, Oriented to Place, Oriented to  Time Alcohol / Substance use:  Not Applicable Psych involvement (Current and /or in the community):  No (Comment)  Discharge Needs  Concerns to be addressed:  Discharge Planning Concerns Readmission within the last 30 days:  No Current discharge risk:  Physical Impairment Barriers to Discharge:  Continued Medical Work up   Estanislado Emms, LCSW 10/28/2016, 10:19 AM

## 2016-10-28 NOTE — Care Management Note (Signed)
Case Management Note  Patient Details  Name: Rebecca [name] MRN: [id number] Date of Birth: July 15, 1940  Subjective/Objective: Pt presented a transfer from [facility] for SOB- Pulmonary Edema.                  Action/Plan: Plan for d/c to [facility]- CSW is assisting with disposition needs. No further needs from CM at this time.   Expected Discharge Date:  10/28/16               Expected Discharge Plan:  Skilled Nursing Facility  In-House Referral:  Clinical Social Work  Discharge planning Services  CM Consult  Post Acute Care Choice:  NA Choice offered to:  NA  DME Arranged:  N/A DME Agency:  NA  HH Arranged:  NA HH Agency:  NA  Status of Service:  Completed, signed off  If discussed at [employer] of Stay Meetings, dates discussed:    Additional Comments:  Gala Lewandowsky, RN 10/28/2016, 12:26 PM

## 2016-10-28 NOTE — Discharge Instructions (Signed)
° ° °  Supplemental Discharge Instructions for  Pacemaker/Defibrillator Patients  Activity No heavy lifting or vigorous activity with your left/right arm for 6 to 8 weeks.  Do not raise your left/right arm above your head for one week.  Gradually raise your affected arm as drawn below.  -         __           10/29/16                    10/30/16                   10/31/16                  11/01/16   WOUND CARE - Keep the wound area clean and dry.  Do not get this area wet for one week. No showers for one week; you may shower on   11/01/16  . - The tape/steri-strips on your wound will fall off; do not pull them off.  No bandage is needed on the site.  DO  NOT apply any creams, oils, or ointments to the wound area. - If you notice any drainage or discharge from the wound, any swelling or bruising at the site, or you develop a fever > 101? F after you are discharged home, call the office at once.  Special Instructions - You are still able to use cellular telephones; use the ear opposite the side where you have your pacemaker/defibrillator.  Avoid carrying your cellular phone near your device. - When traveling through airports, show security personnel your identification card to avoid being screened in the metal detectors.  Ask the security personnel to use the hand wand. - Avoid arc welding equipment, MRI testing (magnetic resonance imaging), TENS units (transcutaneous nerve stimulators).  Call the office for questions about other devices. - Avoid electrical appliances that are in poor condition or are not properly grounded. - Microwave ovens are safe to be near or to operate.

## 2016-10-28 NOTE — Care Management Important Message (Signed)
Important Message  Patient Details  Name: EFFY SUPERNAW MRN: [id number] Date of Birth: 1940-09-20   Medicare Important Message Given:  Yes    Kyla Balzarine 10/28/2016, 10:46 AM

## 2016-10-28 NOTE — NC FL2 (Signed)
[place] MEDICAID FL2 LEVEL OF CARE SCREENING TOOL     IDENTIFICATION  Patient Name: Rebecca [name] Birthdate: Apr 17, 1940 Sex: female Admission Date (Current Location): 10/23/2016  [facility] and [place] Number:  Producer, television/film/video and Address:  [facility] [place], [address], [place], [place] [address]      Provider Number: [id number]  Attending Physician Name and Address:  Regan Lemming, MD  Relative Name and Phone Number:  Philis Fendt, daughter, [phone]    Current Level of Care: Hospital Recommended Level of Care: Skilled Nursing Facility Prior Approval Number:    Date Approved/Denied:   PASRR Number: [id number]  Discharge Plan: SNF    Current Diagnoses: Patient Active Problem List   Diagnosis Date Noted  . Pulmonary edema 10/23/2016  . Acute respiratory failure with hypoxia (HCC) 02/22/2015  . CAP (community acquired pneumonia) 02/22/2015  . Fall at home 09/21/2014  . Mild aortic stenosis 09/08/2014  . Mild to moderate  mitral stenosis 09/08/2014  . Chronic anticoagulation-Coumadin 09/08/2014  . Sepsis (HCC) 09/05/2014  . Lower leg DVT (deep venous thromboembolism), chronic (HCC) 09/05/2014  . Essential hypertension 09/05/2014  . Depression 09/05/2014  . Poorly controlled diabetes mellitus (HCC) 09/05/2014  . Hypomagnesemia 09/05/2014  . Hyponatremia 09/05/2014  . Pyelonephritis 09/05/2014  . Chronic diastolic CHF (congestive heart failure) (HCC)   . 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 GERD (gastroesophageal reflux disease) 07/08/2011  . Gastroparesis 07/08/2011  . IDA (iron deficiency anemia) 07/08/2011  . History of colonic polyps 07/08/2011  . 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 NAFLD (nonalcoholic fatty liver disease) 36/64/4034  . Obesity-BMI 37 07/08/2011    Orientation RESPIRATION BLADDER Height & Weight        Normal Incontinent, External catheter Weight: 201 lb 4.8 oz (91.3  kg) Height:  5' 8.5" (174 cm)  BEHAVIORAL SYMPTOMS/MOOD NEUROLOGICAL BOWEL NUTRITION STATUS      Continent Diet (please see DC summary)  AMBULATORY STATUS COMMUNICATION OF NEEDS Skin   Extensive Assist Verbally Surgical wounds (incision L anterior upper chest)                       Personal Care Assistance Level of Assistance  Bathing, Feeding, Dressing Bathing Assistance: Maximum assistance Feeding assistance: Limited assistance Dressing Assistance: Maximum assistance     Functional Limitations Info  Sight, Hearing, Speech Sight Info: Adequate Hearing Info: Adequate Speech Info: Adequate    SPECIAL CARE FACTORS FREQUENCY  PT (By licensed PT)     PT Frequency: 5x/week              Contractures Contractures Info: Present    Additional Factors Info  Code Status, Allergies, Psychotropic Code Status Info: Full Allergies Info: Codeine, Compazine, Other, Penicillins, Morphine And Related, Demerol Meperidine Psychotropic Info: cymbalta, remeron, seroquel         Current Medications (10/28/2016):  This is the current hospital active medication list Current Facility-Administered Medications  Medication Dose Route Frequency Provider Last Rate Last Dose  . 0.9 %  sodium chloride infusion  250 mL Intravenous PRN Haydee Salter, MD      . acetaminophen (TYLENOL) tablet 325-650 mg  325-650 mg Oral Q4H PRN Regan Lemming, MD   650 mg at 10/27/16 0908  . albuterol (PROVENTIL) (2.5 MG/3ML) 0.083% nebulizer solution 2.5 mg  2.5 mg Nebulization Q2H PRN Haydee Salter, MD   2.5 mg at 10/23/16 0413  . amLODipine (NORVASC) tablet 10  mg  10 mg Oral Daily Ok Anis, NP   10 mg at 10/28/16 0932  . aspirin EC tablet 81 mg  81 mg Oral Daily Haydee Salter, MD   81 mg at 10/28/16 0932  . carvedilol (COREG) tablet 3.125 mg  3.125 mg Oral BID WC Nicolasa Ducking R, NP   3.125 mg at 10/28/16 0932  . clomiPRAMINE (ANAFRANIL) capsule 25 mg  25 mg Oral QID Haydee Salter, MD   25 mg at 10/27/16 2243  . clonazePAM (KLONOPIN) tablet 1 mg  1 mg Oral QHS PRN Schorr, Roma Kayser, NP   1 mg at 10/25/16 2030  . DULoxetine (CYMBALTA) DR capsule 60 mg  60 mg Oral Daily Haydee Salter, MD   60 mg at 10/28/16 0932  . hydrALAZINE (APRESOLINE) injection 10 mg  10 mg Intravenous Q8H PRN Haydee Salter, MD      . mirtazapine (REMERON) tablet 15 mg  15 mg Oral QHS Haydee Salter, MD   15 mg at 10/27/16 2232  . mupirocin ointment (BACTROBAN) 2 %   Nasal BID Camnitz, Will Daphine Deutscher, MD      . ondansetron [facility]) injection 4 mg  4 mg Intravenous Q6H PRN Camnitz, Will Daphine Deutscher, MD      . QUEtiapine (SEROQUEL) tablet 100 mg  100 mg Oral QHS Schorr, Roma Kayser, NP   100 mg at 10/27/16 2233  . sodium chloride flush (NS) 0.9 % injection 3 mL  3 mL Intravenous Q12H Haydee Salter, MD   3 mL at 10/27/16 2232  . sodium chloride flush (NS) 0.9 % injection 3 mL  3 mL Intravenous PRN Haydee Salter, MD      . Warfarin - Pharmacist Dosing Inpatient   Does not apply Q24H [name], MD         Discharge Medications: Please see discharge summary for a list of discharge medications.  Relevant Imaging Results:  Relevant Lab Results:   Additional Information SSN: [id number]  Abigail Butts, LCSW

## 2016-10-28 NOTE — Clinical Social Work Placement (Signed)
   CLINICAL SOCIAL WORK PLACEMENT  NOTE  Date:  10/28/2016  Patient Details  Name: Rebecca [name] MRN: [id number] Date of Birth: [birth year]  Clinical Social Work is seeking post-discharge placement for this patient at the Skilled  Nursing Facility level of care (*CSW will initial, date and re-position this form in  chart as items are completed):  Yes   Patient/family provided with [place] Clinical Social Work Department's list of facilities offering this level of care within the geographic area requested by the patient (or if unable, by the patient's family).  Yes   Patient/family informed of their freedom to choose among providers that offer the needed level of care, that participate in Medicare, Medicaid or managed care program needed by the patient, have an available bed and are willing to accept the patient.  Yes   Patient/family informed of [facility]'s ownership interest in [facility] and [facility], as well as of the fact that they are under no obligation to receive care at these facilities.  PASRR submitted to EDS on       PASRR number received on       Existing PASRR number confirmed on 10/28/16     FL2 transmitted to all facilities in geographic area requested by pt/family on       FL2 transmitted to all facilities within larger geographic area on       Patient informed that his/her managed care company has contracts with or will negotiate with certain facilities, including the following:  [facility]     Yes   Patient/family informed of bed offers received.  Patient chooses bed at [facility]     Physician recommends and patient chooses bed at      Patient to be transferred to [facility] on 10/28/16.  Patient to be transferred to facility by [facility]     Patient family notified on 10/28/16 of transfer.  Name of family member notified:  Robet Leu, son     PHYSICIAN Please prepare priority discharge summary, including  medications     Additional Comment:    _______________________________________________ Abigail Butts, LCSW 10/28/2016, 12:41 PM

## 2016-10-29 ENCOUNTER — Non-Acute Institutional Stay (SKILLED_NURSING_FACILITY): Payer: Medicare Other | Admitting: Internal Medicine

## 2016-10-29 ENCOUNTER — Encounter: Payer: Self-pay | Admitting: Internal Medicine

## 2016-10-29 DIAGNOSIS — I5032 Chronic diastolic (congestive) heart failure: Secondary | ICD-10-CM

## 2016-10-29 DIAGNOSIS — I825Z1 Chronic embolism and thrombosis of unspecified deep veins of right distal lower extremity: Secondary | ICD-10-CM | POA: Diagnosis not present

## 2016-10-29 DIAGNOSIS — Z950 Presence of cardiac pacemaker: Secondary | ICD-10-CM

## 2016-10-29 DIAGNOSIS — G47411 Narcolepsy with cataplexy: Secondary | ICD-10-CM | POA: Diagnosis not present

## 2016-10-29 DIAGNOSIS — E1165 Type 2 diabetes mellitus with hyperglycemia: Secondary | ICD-10-CM | POA: Diagnosis not present

## 2016-10-29 DIAGNOSIS — I10 Essential (primary) hypertension: Secondary | ICD-10-CM

## 2016-10-29 NOTE — Progress Notes (Signed)
Provider: Einar Crow  Location:   [facility] Room Number: 134/P Place of Service:  SNF (31)  PCP: [name], MD Patient Care Team: [name], MD as PCP - General  Extended Emergency Contact Information Primary Emergency Contact: Cox,Brenda Address: [address]          [place], [place] Home Phone: [phone] Work Phone: [phone] Mobile Phone: [phone] Relation: Daughter Secondary Emergency Contact: Graylin Shiver Address: [address]          [place], [place] [address] [place] Home Phone: [phone] Mobile Phone: [phone] Relation: Son  Code Status: DNR Goals of Care: Advanced Directive information Advanced Directives 10/29/2016  Does Patient Have a Medical Advance Directive? Yes  Type of Advance Directive Out of facility DNR (pink MOST or yellow form)  Does patient want to make changes to medical advance directive? No - Patient declined  Would patient like information on creating a medical advance directive? No - Patient declined  Pre-existing out of facility DNR order (yellow form or pink MOST form) -      Chief Complaint  Patient presents with  . New Admit To SNF    New Admission Visit,     HPI: Patient is a 76 y.o. female seen today for admission to SNF for therapy.  Patient has h/o Cataplexy, Diastolic CHF, Mild AS and Moderate MS, Diabetes type 2 , DVT on Chronic Coumadin, Hypertension, Recurrent falls recently, Depression and Narcolepsy.  Patient was came to the hospital for weakness and SOB and Falling. 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 Her Diltiazem was stopped and then she underwent MDT Dual Chamber PPM. She did develop PPM pocket Hematoma which got resolved. And she was restarted on coumadin. Since patient has had significant decline for past few months with falls and inability to walk she was transferred to SNF for  therapy.  Patient says she has been walking with the Walker at home and has progressively getting weaker recently. She has had few falls at home. No Syncope. Just loosing balance. Patient son Lives with her and is main provider. He takes care of all her finances . Patient says her house is wheelchair accessible and she will like to go home soon.    Past Medical History:  Diagnosis Date  . Aortic stenosis   . Cataplexy   . Diastolic heart failure (HCC)   . Essential hypertension   . GERD (gastroesophageal reflux disease)   . Hiatal hernia   . History of recurrent deep vein thrombosis (DVT)   . Mitral stenosis   . Narcolepsy   . Neuropathy   . Type 2 diabetes mellitus (HCC)    Past Surgical History:  Procedure Laterality Date  . ABDOMINAL HYSTERECTOMY    . CARPAL TUNNEL RELEASE    . CARPECTOMY  12/26/2011   Procedure: CARPECTOMY;  Surgeon: Wyn Forster., MD;  Location: [facility];  Service: Orthopedics;  Laterality: Right;  PROXIMAL ROW CARPECTOMY RIGHT WRIST and neurectomy  . CHOLECYSTECTOMY    . HEMORROIDECTOMY    . KIDNEY SURGERY    . NEPHROSTOMY    . PACEMAKER IMPLANT N/A 10/25/2016   Procedure: Pacemaker Implant;  Surgeon: Regan Lemming, MD;  Location: [facility] INVASIVE CV LAB;  Service: Cardiovascular;  Laterality: N/A;  . TOTAL HIP ARTHROPLASTY     bilateral    reports that she quit smoking about 28 years ago. She has  never used smokeless tobacco. She reports that she does not drink alcohol or use drugs. Social History   Social History  . Marital status: Widowed    Spouse name: N/A  . Number of children: N/A  . Years of education: 29   Occupational History  .  Retired   Social History Main Topics  . Smoking status: Former Smoker    Quit date: 10/13/1988  . Smokeless tobacco: Never Used  . Alcohol use No  . Drug use: No  . Sexual activity: Not on file   Other Topics Concern  . Not on file   Social History Narrative  . No narrative on  file    Functional Status Survey:    Family History  Problem Relation Age of Onset  . Heart disease Father   . Diabetes Unknown   . Stroke Mother   . Scleroderma Brother     Health Maintenance  Topic Date Due  . FOOT EXAM  11/28/2016 (Originally 10/21/1950)  . HEMOGLOBIN A1C  11/28/2016 (Originally 03/08/2015)  . OPHTHALMOLOGY EXAM  11/28/2016 (Originally 10/21/1950)  . URINE MICROALBUMIN  11/28/2016 (Originally 10/21/1950)  . DEXA SCAN  11/28/2016 (Originally 10/21/2004)  . INFLUENZA VACCINE  01/04/2017 (Originally 09/04/2016)  . PNA vac Low Risk Adult (1 of 2 - PCV13) 01/04/2017 (Originally 10/21/2004)  . TETANUS/TDAP  04/05/2026    Allergies  Allergen Reactions  . Codeine Anxiety  . Compazine Anaphylaxis  . Other Anaphylaxis, Rash and Other (See Comments)    Uncoded Allergy. Allergen: INOVAR Uncoded Allergy. Allergen: ALL ADHESIVES Uncoded Allergy. Allergen: SILK SUTURE Uncoded Allergy. Allergen: merthiolate Thermasol  . Penicillins Shortness Of Breath and Rash    Has patient had a PCN reaction causing immediate rash, facial/tongue/throat swelling, SOB or lightheadedness with hypotension: Yes Has patient had a PCN reaction causing severe rash involving mucus membranes or skin necrosis: No Has patient had a PCN reaction that required hospitalization No Has patient had a PCN reaction occurring within the last 10 years: Yes If all of the above answers are "NO", then may proceed with Cephalosporin use.   Marland Kitchen Morphine And Related Other (See Comments)    Patient states that it makes her lose her state of mind.   . Demerol [Meperidine] Rash    Allergies as of 10/29/2016      Reactions   Codeine Anxiety   Compazine Anaphylaxis   Other Anaphylaxis, Rash, Other (See Comments)   Uncoded Allergy. Allergen: INOVAR Uncoded Allergy. Allergen: ALL ADHESIVES Uncoded Allergy. Allergen: SILK SUTURE Uncoded Allergy. Allergen: merthiolate Thermasol   Penicillins Shortness Of Breath, Rash     Has patient had a PCN reaction causing immediate rash, facial/tongue/throat swelling, SOB or lightheadedness with hypotension: Yes Has patient had a PCN reaction causing severe rash involving mucus membranes or skin necrosis: No Has patient had a PCN reaction that required hospitalization No Has patient had a PCN reaction occurring within the last 10 years: Yes If all of the above answers are "NO", then may proceed with Cephalosporin use.   Morphine And Related Other (See Comments)   Patient states that it makes her lose her state of mind.    Demerol [meperidine] Rash      Medication List       Accurate as of 10/29/16 10:09 AM. Always use your most recent med list.          BYDUREON 2 MG Pen Generic drug:  Exenatide ER Inject 2 mg into the skin once a week.   carvedilol  3.125 MG tablet Commonly known as:  COREG Take 1 tablet (3.125 mg total) by mouth 2 (two) times daily with a meal.   celecoxib 200 MG capsule Commonly known as:  CELEBREX Take 200 mg by mouth daily. For arthritis   clomiPRAMINE 25 MG capsule Commonly known as:  ANAFRANIL Take 25 mg by mouth 4 (four) times daily.   clonazePAM 1 MG tablet Commonly known as:  KLONOPIN Take 1 mg by mouth at bedtime.   Co Q10 200 MG Caps Take 1 capsule by mouth daily.   DULoxetine 60 MG capsule Commonly known as:  CYMBALTA Take 60 mg by mouth daily.   FERROCITE 324 (106 Fe) MG Tabs tablet Generic drug:  Ferrous Fumarate Take 1 capsule by mouth daily.   furosemide 40 MG tablet Commonly known as:  LASIX Take 20 mg by mouth 2 (two) times daily.   HYALURONIC ACID PO Take 100 mg by mouth daily.   insulin glargine 100 unit/mL Sopn Commonly known as:  LANTUS Inject 20 Units into the skin at bedtime.   Magnesium 300 MG Caps Take 600 mg by mouth daily.   metFORMIN 500 MG tablet Commonly known as:  GLUCOPHAGE Take 500 mg by mouth 2 (two) times daily with a meal.   mirtazapine 15 MG tablet Commonly known as:   REMERON Take 15 mg by mouth at bedtime.   potassium chloride 10 MEQ tablet Commonly known as:  K-DUR Take 1 tablet (10 mEq total) by mouth daily.   PROBIOTIC ADVANCED PO Take 1 capsule by mouth daily.   QUEtiapine 100 MG tablet Commonly known as:  SEROQUEL Take 100 mg by mouth at bedtime.   Vitamin D3 3000 units Tabs Take 1 capsule by mouth daily.   warfarin 2.5 MG tablet Commonly known as:  COUMADIN Take 2.5 mg by mouth every other day. Starting 10/29/2016 alternating 5 mg with the 2.5 mg daily   warfarin 5 MG tablet Commonly known as:  COUMADIN Take 5 mg by mouth every other day. Starting 10/29/2016 take 2.5 mg alternating with 5 mg daily       Review of Systems  Constitutional: Positive for activity change. Negative for appetite change and fever.  HENT: Negative.   Respiratory: Negative.   Cardiovascular: Positive for leg swelling. Negative for chest pain and palpitations.  Gastrointestinal: Positive for constipation. Negative for abdominal distention, abdominal pain, diarrhea and nausea.  Genitourinary: Negative.   Musculoskeletal: Negative.   Skin: Negative.   Neurological: Positive for dizziness and weakness.  Psychiatric/Behavioral: Positive for sleep disturbance. Negative for agitation, behavioral problems and confusion.    Vitals:   10/29/16 1008  BP: (!) 161/81  Pulse: 82  Resp: 20  Temp: (!) 97.2 F (36.2 C)  TempSrc: Oral   There is no height or weight on file to calculate BMI. Physical Exam  Constitutional: She is oriented to person, place, and time. She appears well-developed and well-nourished.  HENT:  Head: Normocephalic.  Mouth/Throat: Oropharynx is clear and moist.  Eyes: Pupils are equal, round, and reactive to light.  Neck: Neck supple.  Cardiovascular: Normal rate and regular rhythm.   Murmur heard. Pulmonary/Chest: Effort normal and breath sounds normal.  Few crackles at the Base  Abdominal: Soft. Bowel sounds are normal. She exhibits  no distension. There is no tenderness. There is no rebound.  Musculoskeletal:  Trace edema Bilateral with Chronic Venous Changes.  Neurological: She is alert and oriented to person, place, and time.  No Focal Deficits.  Skin: Skin  is warm and dry.  Psychiatric: She has a normal mood and affect. Her behavior is normal. Thought content normal.    Labs reviewed: Basic Metabolic Panel:  Recent Labs  39/03/00 0220 10/28/16 0319 10/28/16 0511  NA 140 133* 134*  K 3.5 3.9 4.0  CL 106 96* 98*  CO2 25 27 28   GLUCOSE 108* 194* 198*  BUN 41* 30* 30*  CREATININE 3.01* 0.98 1.07*  CALCIUM 9.1 9.1 9.0   Liver Function Tests:  Recent Labs  10/23/16 0053  AST 21  ALT 19  ALKPHOS 60  BILITOT 0.5  PROT 7.7  ALBUMIN 3.3*   No results for input(s): LIPASE, AMYLASE in the last 8760 hours. No results for input(s): AMMONIA in the last 8760 hours. CBC:  Recent Labs  09/24/16 1518 10/23/16 0053  10/26/16 1123 10/27/16 0220 10/27/16 1742 10/28/16 0319  WBC 8.4 10.2  --  11.9* 7.8  --  9.8  NEUTROABS 5.8 6.2  --   --   --   --   --   HGB 14.0 13.1  < > 14.2 9.9* 14.1 12.8  HCT 42.7 38.6  < > 43.4 29.9* 41.4 37.8  MCV 89.5 85.0  --  85.6 86.2  --  83.3  PLT 267 355  --  349 286  --  349  < > = values in this interval not displayed. Cardiac Enzymes:  Recent Labs  10/23/16 0053 10/23/16 0501  TROPONINI <0.03 <0.03   BNP: Invalid input(s): POCBNP Lab Results  Component Value Date   HGBA1C 7.3 (H) 09/05/2014   Lab Results  Component Value Date   TSH 1.121 10/25/2010   No results found for: VITAMINB12 No results found for: FOLATE Lab Results  Component Value Date   IRON 71 09/24/2016   TIBC 346 09/24/2016   FERRITIN 73 09/24/2016    Imaging and Procedures obtained prior to SNF admission: Ct Head Wo Contrast  Result Date: 10/23/2016 CLINICAL DATA:  Altered level of consciousness.  Fall tonight. EXAM: CT HEAD WITHOUT CONTRAST CT CERVICAL SPINE WITHOUT CONTRAST  TECHNIQUE: Multidetector CT imaging of the head and cervical spine was performed following the standard protocol without intravenous contrast. Multiplanar CT image reconstructions of the cervical spine were also generated. COMPARISON:  CT head 12/14/2005 FINDINGS: CT HEAD FINDINGS Brain: Diffuse cerebral atrophy. Mild ventricular dilatation consistent with central atrophy. Low-attenuation changes in the deep white matter consistent with small vessel ischemia. No mass effect or midline shift. No abnormal extra-axial fluid collections. Gray-white matter junctions are distinct. Basal cisterns are not effaced. No acute intracranial hemorrhage. Vascular: Vascular calcifications in the internal carotid arteries. Skull: No depressed skull fractures. Sinuses/Orbits: Paranasal sinuses and mastoid air cells are clear. Other: None. CT CERVICAL SPINE FINDINGS Alignment: Examination is limited due to motion artifact. Normal alignment of the cervical vertebrae and facet joints. C1-2 articulation appears intact. Skull base and vertebrae: No acute fracture. No primary bone lesion or focal pathologic process. Soft tissues and spinal canal: No prevertebral fluid or swelling. No visible canal hematoma. Disc levels: Degenerative changes throughout the cervical spine with narrowed interspaces and endplate hypertrophic changes. Degenerative changes throughout the cervical facet joints. Upper chest: Prominent nodular enlargement of the thyroid gland with left thyroid measuring 4.5 x 3.3 cm diameter. Other: None. IMPRESSION: 1. No acute intracranial abnormalities. Chronic atrophy and small vessel ischemic changes. 2. Normal alignment of the cervical spine. Diffuse degenerative change. No acute displaced fractures identified. 3. Nodular enlargement of the thyroid gland particularly on  the left. Suggest follow-up with ultrasound in the elective setting after resolution of acute process. Electronically Signed   By: Burman Nieves M.D.   On:  10/23/2016 02:54   Ct Cervical Spine Wo Contrast  Result Date: 10/23/2016 CLINICAL DATA:  Altered level of consciousness.  Fall tonight. EXAM: CT HEAD WITHOUT CONTRAST CT CERVICAL SPINE WITHOUT CONTRAST TECHNIQUE: Multidetector CT imaging of the head and cervical spine was performed following the standard protocol without intravenous contrast. Multiplanar CT image reconstructions of the cervical spine were also generated. COMPARISON:  CT head 12/14/2005 FINDINGS: CT HEAD FINDINGS Brain: Diffuse cerebral atrophy. Mild ventricular dilatation consistent with central atrophy. Low-attenuation changes in the deep white matter consistent with small vessel ischemia. No mass effect or midline shift. No abnormal extra-axial fluid collections. Gray-white matter junctions are distinct. Basal cisterns are not effaced. No acute intracranial hemorrhage. Vascular: Vascular calcifications in the internal carotid arteries. Skull: No depressed skull fractures. Sinuses/Orbits: Paranasal sinuses and mastoid air cells are clear. Other: None. CT CERVICAL SPINE FINDINGS Alignment: Examination is limited due to motion artifact. Normal alignment of the cervical vertebrae and facet joints. C1-2 articulation appears intact. Skull base and vertebrae: No acute fracture. No primary bone lesion or focal pathologic process. Soft tissues and spinal canal: No prevertebral fluid or swelling. No visible canal hematoma. Disc levels: Degenerative changes throughout the cervical spine with narrowed interspaces and endplate hypertrophic changes. Degenerative changes throughout the cervical facet joints. Upper chest: Prominent nodular enlargement of the thyroid gland with left thyroid measuring 4.5 x 3.3 cm diameter. Other: None. IMPRESSION: 1. No acute intracranial abnormalities. Chronic atrophy and small vessel ischemic changes. 2. Normal alignment of the cervical spine. Diffuse degenerative change. No acute displaced fractures identified. 3. Nodular  enlargement of the thyroid gland particularly on the left. Suggest follow-up with ultrasound in the elective setting after resolution of acute process. Electronically Signed   By: Burman Nieves M.D.   On: 10/23/2016 02:54   Dg Chest Port 1 View  Result Date: 10/23/2016 CLINICAL DATA:  Generalize weakness and fall tonight. History of CHF, diabetes, hypertension, shortness of breath, former smoker. EXAM: PORTABLE CHEST 1 VIEW COMPARISON:  02/22/2015 FINDINGS: Shallow inspiration. Cardiac enlargement with pulmonary vascular congestion. Diffuse interstitial pattern to the lungs likely represents edema. No blunting of costophrenic angles. No pneumothorax. Calcified and tortuous aorta. Probable esophageal hiatal hernia behind the heart. IMPRESSION: Cardiac enlargement with pulmonary vascular congestion and bilateral interstitial edema. Electronically Signed   By: Burman Nieves M.D.   On: 10/23/2016 01:21    Assessment/Plan Chronic diastolic CHF  Patient on Lasix 40 mg a day Will continue same dose  Follow weights Repeat renal Function Continue coreg  Status post placement of cardiac pacemaker Doing well. Follow up with Cardiology Essential hypertension BP slightly high today. She has been off her Diltiazem due to AV block. Will monitor . Probably will need another Agent.  Primary narcolepsy with cataplexy and depression Patient has been on number of medications including Remeron, Cymbalta, Seroquel and Clomipramine Klonopin .D/W patient to taper some meds especially if she continues to have falls.   Diabetes Mellitus Repeat A1C  Increase her Lantus as her BS ar running high in the afternoon. She is also on Bydureon and metformin.  Chronic DVT On Coumadin  Repeat INR  Recurrent falls Continue therapy. Also patient needs to be off some of her psychotropic Meds. ? Thyroid nodule on CTscan Will order Thyroid US as out patient on Discharge. Disposition Patient wants to go  home  soon with her son.   Family/ staff Communication:   Labs/tests ordered:  Total time spent in this patient care encounter was _45 minutes; greater than 50% of the visit spent counseling patient, reviewing records , Labs and coordinating care for problems addressed at this encounter.

## 2016-10-31 ENCOUNTER — Other Ambulatory Visit (HOSPITAL_COMMUNITY)
Admission: RE | Admit: 2016-10-31 | Discharge: 2016-10-31 | Disposition: A | Discharge status: Home / Self Care | Payer: Medicare Other | Source: Skilled Nursing Facility | Attending: Internal Medicine | Admitting: Internal Medicine

## 2016-10-31 DIAGNOSIS — Z7901 Long term (current) use of anticoagulants: Secondary | ICD-10-CM | POA: Insufficient documentation

## 2016-10-31 LAB — PROTIME-INR
INR: 1.14
Prothrombin Time: 14.5 seconds (ref 11.4–15.2)

## 2016-11-01 ENCOUNTER — Encounter: Payer: Self-pay | Admitting: Internal Medicine

## 2016-11-01 NOTE — Progress Notes (Signed)
Location:    [facility] Room Number: 134/P Place of Service:  SNF [phone]) Provider:  Velva Harman, MD  Patient Care Team: [name], MD as PCP - General  Extended Emergency Contact Information Primary Emergency Contact: Cox,Brenda Address: [address]          [place], [place] Home Phone: [phone] Work Phone: [phone] Mobile Phone: [phone] Relation: Daughter Secondary Emergency Contact: Graylin Shiver Address: [address]          [place], [place] [address] [place] Home Phone: [phone] Mobile Phone: [phone] Relation: Son  Code Status:  DNR Goals of care: Advanced Directive information Advanced Directives 11/01/2016  Does Patient Have a Medical Advance Directive? Yes  Type of Advance Directive Out of facility DNR (pink MOST or yellow form)  Does patient want to make changes to medical advance directive? No - Patient declined  Would patient like information on creating a medical advance directive? No - Patient declined  Pre-existing out of facility DNR order (yellow form or pink MOST form) -     Chief Complaint  Patient presents with  . Discharge Note    Discharge Visit    HPI:  Pt is a 76 y.o. female seen today for an acute visit for    Past Medical History:  Diagnosis Date  . Aortic stenosis   . Cataplexy   . Diastolic heart failure (HCC)   . Essential hypertension   . GERD (gastroesophageal reflux disease)   . Hiatal hernia   . History of recurrent deep vein thrombosis (DVT)   . Mitral stenosis   . Narcolepsy   . Neuropathy   . Type 2 diabetes mellitus (HCC)    Past Surgical History:  Procedure Laterality Date  . ABDOMINAL HYSTERECTOMY    . CARPAL TUNNEL RELEASE    . CARPECTOMY  12/26/2011   Procedure: CARPECTOMY;  Surgeon: Wyn Forster., MD;  Location: [facility];  Service: Orthopedics;  Laterality: Right;   PROXIMAL ROW CARPECTOMY RIGHT WRIST and neurectomy  . CHOLECYSTECTOMY    . HEMORROIDECTOMY    . KIDNEY SURGERY    . NEPHROSTOMY    . PACEMAKER IMPLANT N/A 10/25/2016   Procedure: Pacemaker Implant;  Surgeon: Regan Lemming, MD;  Location: [facility];  Service: Cardiovascular;  Laterality: N/A;  . TOTAL HIP ARTHROPLASTY     bilateral    Allergies  Allergen Reactions  . Codeine Anxiety  . Compazine Anaphylaxis  . Other Anaphylaxis, Rash and Other (See Comments)    Uncoded Allergy. Allergen: INOVAR Uncoded Allergy. Allergen: ALL ADHESIVES Uncoded Allergy. Allergen: SILK SUTURE Uncoded Allergy. Allergen: merthiolate Thermasol  . Penicillins Shortness Of Breath and Rash    Has patient had a PCN reaction causing immediate rash, facial/tongue/throat swelling, SOB or lightheadedness with hypotension: Yes Has patient had a PCN reaction causing severe rash involving mucus membranes or skin necrosis: No Has patient had a PCN reaction that required hospitalization No Has patient had a PCN reaction occurring within the last 10 years: Yes If all of the above answers are "NO", then may proceed with Cephalosporin use.   Marland Kitchen Morphine And Related Other (See Comments)    Patient states that it makes her lose her state of mind.   . Demerol [Meperidine] Rash    Outpatient Encounter Prescriptions as of 11/01/2016  Medication Sig  . BYDUREON 2 MG PEN Inject 2 mg into the skin once  a week.  . carvedilol (COREG) 3.125 MG tablet Take 1 tablet (3.125 mg total) by mouth 2 (two) times daily with a meal.  . celecoxib (CELEBREX) 200 MG capsule Take 200 mg by mouth daily. For arthritis  . Cholecalciferol (VITAMIN D3) 3000 UNITS TABS Take 1 capsule by mouth daily.  . clomiPRAMINE (ANAFRANIL) 25 MG capsule Take 25 mg by mouth 4 (four) times daily.   . clonazePAM (KLONOPIN) 1 MG tablet Take 1 mg by mouth at bedtime.   . Coenzyme Q10 (CO Q10) 200 MG CAPS Take 1 capsule by mouth daily.  . DULoxetine  (CYMBALTA) 60 MG capsule Take 60 mg by mouth daily.    . Ferrous Fumarate (FERROCITE) 324 (106 FE) MG TABS Take 1 capsule by mouth daily.   . furosemide (LASIX) 40 MG tablet Take 20 mg by mouth 2 (two) times daily.   Marland Kitchen Hyaluronic Acid-Vitamin C (HYALURONIC ACID PO) Take 100 mg by mouth daily.  . insulin glargine (LANTUS) 100 unit/mL SOPN Inject 20 Units into the skin at bedtime.   . magnesium oxide (MAG-OX) 400 MG tablet Take 400 mg by mouth daily.  . metFORMIN (GLUCOPHAGE) 500 MG tablet Take 500 mg by mouth 2 (two) times daily with a meal.    . mirtazapine (REMERON) 15 MG tablet Take 15 mg by mouth at bedtime.    . potassium chloride (K-DUR) 10 MEQ tablet Take 1 tablet (10 mEq total) by mouth daily.  . Probiotic Product (PROBIOTIC ADVANCED PO) Take 1 capsule by mouth daily.  . QUEtiapine (SEROQUEL) 100 MG tablet Take 100 mg by mouth at bedtime.  Marland Kitchen warfarin (COUMADIN) 5 MG tablet Take 5 mg by mouth every evening. Starting 10/29/2016 take 2.5 mg alternating with 5 mg daily   . [DISCONTINUED] Magnesium 300 MG CAPS Take 600 mg by mouth daily.  . [DISCONTINUED] warfarin (COUMADIN) 2.5 MG tablet Take 2.5 mg by mouth every other day. Starting 10/29/2016 alternating 5 mg with the 2.5 mg daily   No facility-administered encounter medications on file as of 11/01/2016.     Review of Systems  Immunization History  Administered Date(s) Administered  . Tdap 04/04/2016   Pertinent  Health Maintenance Due  Topic Date Due  . FOOT EXAM  11/28/2016 (Originally 10/21/1950)  . HEMOGLOBIN A1C  11/28/2016 (Originally 03/08/2015)  . OPHTHALMOLOGY EXAM  11/28/2016 (Originally 10/21/1950)  . URINE MICROALBUMIN  11/28/2016 (Originally 10/21/1950)  . DEXA SCAN  11/28/2016 (Originally 10/21/2004)  . INFLUENZA VACCINE  01/04/2017 (Originally 09/04/2016)  . PNA vac Low Risk Adult (1 of 2 - PCV13) 01/04/2017 (Originally 10/21/2004)   No flowsheet data found. Functional Status Survey:    There were no vitals filed for  this visit. There is no height or weight on file to calculate BMI. Physical Exam  Labs reviewed:  Recent Labs  10/27/16 0220 10/28/16 0319 10/28/16 0511  NA 140 133* 134*  K 3.5 3.9 4.0  CL 106 96* 98*  CO2 GLUCOSE 108* 194* 198*  BUN 41* 30* 30*  CREATININE 3.01* 0.98 1.07*  CALCIUM 9.1 9.1 9.0    Recent Labs  10/23/16 0053  AST 21  ALT 19  ALKPHOS 60  BILITOT 0.5  PROT 7.7  ALBUMIN 3.3*    Recent Labs  09/24/16 1518 10/23/16 0053  10/26/16 1123 10/27/16 0220 10/27/16 1742 10/28/16 0319  WBC 8.4 10.2  --  11.9* 7.8  --  9.8  NEUTROABS 5.8 6.2  --   --   --   --   --  HGB 14.0 13.1  < > 14.2 9.9* 14.1 12.8  HCT 42.7 38.6  < > 43.4 29.9* 41.4 37.8  MCV 89.5 85.0  --  85.6 86.2  --  83.3  PLT 267 355  --  349 286  --  349  < > = values in this interval not displayed. Lab Results  Component Value Date   TSH 1.121 10/25/2010   Lab Results  Component Value Date   HGBA1C 7.3 (H) 09/05/2014   No results found for: CHOL, HDL, LDLCALC, LDLDIRECT, TRIG, CHOLHDL  Significant Diagnostic Results in last 30 days:  Dg Chest 2 View  Result Date: 10/26/2016 CLINICAL DATA:  Recent pacemaker placement EXAM: CHEST  2 VIEW COMPARISON:  10/23/2016 FINDINGS: Cardiac shadow is mildly enlarged. Pacing device is noted. No pneumothorax is seen. Vascular congestion and interstitial edema is noted although improved from the prior exam. No bony abnormality is seen. IMPRESSION: No pneumothorax following pacemaker placement. Improving interstitial edema. Electronically Signed   By: Alcide Clever M.D.   On: 10/26/2016 07:47   Dg Shoulder Right  Result Date: 10/25/2016 CLINICAL DATA:  Pain following fall EXAM: RIGHT SHOULDER - 2+ VIEW COMPARISON:  None. FINDINGS: Frontal and Y scapular images were obtained. No evident fracture or dislocation. There is mild narrowing of the glenohumeral joint. The acromioclavicular joint appears normal. No erosive change or intra-articular  calcification. Visualized right lung is clear. IMPRESSION: Narrowing of the glenohumeral joint. Acromioclavicular joint unremarkable. No fracture or dislocation. Electronically Signed   By: Bretta Bang III M.D.   On: 10/25/2016 10:45   Ct Head Wo Contrast  Result Date: 10/23/2016 CLINICAL DATA:  Altered level of consciousness.  Fall tonight. EXAM: CT HEAD WITHOUT CONTRAST CT CERVICAL SPINE WITHOUT CONTRAST TECHNIQUE: Multidetector CT imaging of the head and cervical spine was performed following the standard protocol without intravenous contrast. Multiplanar CT image reconstructions of the cervical spine were also generated. COMPARISON:  CT head 12/14/2005 FINDINGS: CT HEAD FINDINGS Brain: Diffuse cerebral atrophy. Mild ventricular dilatation consistent with central atrophy. Low-attenuation changes in the deep white matter consistent with small vessel ischemia. No mass effect or midline shift. No abnormal extra-axial fluid collections. Gray-white matter junctions are distinct. Basal cisterns are not effaced. No acute intracranial hemorrhage. Vascular: Vascular calcifications in the internal carotid arteries. Skull: No depressed skull fractures. Sinuses/Orbits: Paranasal sinuses and mastoid air cells are clear. Other: None. CT CERVICAL SPINE FINDINGS Alignment: Examination is limited due to motion artifact. Normal alignment of the cervical vertebrae and facet joints. C1-2 articulation appears intact. Skull base and vertebrae: No acute fracture. No primary bone lesion or focal pathologic process. Soft tissues and spinal canal: No prevertebral fluid or swelling. No visible canal hematoma. Disc levels: Degenerative changes throughout the cervical spine with narrowed interspaces and endplate hypertrophic changes. Degenerative changes throughout the cervical facet joints. Upper chest: Prominent nodular enlargement of the thyroid gland with left thyroid measuring 4.5 x 3.3 cm diameter. Other: None. IMPRESSION: 1.  No acute intracranial abnormalities. Chronic atrophy and small vessel ischemic changes. 2. Normal alignment of the cervical spine. Diffuse degenerative change. No acute displaced fractures identified. 3. Nodular enlargement of the thyroid gland particularly on the left. Suggest follow-up with ultrasound in the elective setting after resolution of acute process. Electronically Signed   By: Burman Nieves M.D.   On: 10/23/2016 02:54   Ct Cervical Spine Wo Contrast  Result Date: 10/23/2016 CLINICAL DATA:  Altered level of consciousness.  Fall tonight. EXAM: CT HEAD WITHOUT CONTRAST  CT CERVICAL SPINE WITHOUT CONTRAST TECHNIQUE: Multidetector CT imaging of the head and cervical spine was performed following the standard protocol without intravenous contrast. Multiplanar CT image reconstructions of the cervical spine were also generated. COMPARISON:  CT head 12/14/2005 FINDINGS: CT HEAD FINDINGS Brain: Diffuse cerebral atrophy. Mild ventricular dilatation consistent with central atrophy. Low-attenuation changes in the deep white matter consistent with small vessel ischemia. No mass effect or midline shift. No abnormal extra-axial fluid collections. Gray-white matter junctions are distinct. Basal cisterns are not effaced. No acute intracranial hemorrhage. Vascular: Vascular calcifications in the internal carotid arteries. Skull: No depressed skull fractures. Sinuses/Orbits: Paranasal sinuses and mastoid air cells are clear. Other: None. CT CERVICAL SPINE FINDINGS Alignment: Examination is limited due to motion artifact. Normal alignment of the cervical vertebrae and facet joints. C1-2 articulation appears intact. Skull base and vertebrae: No acute fracture. No primary bone lesion or focal pathologic process. Soft tissues and spinal canal: No prevertebral fluid or swelling. No visible canal hematoma. Disc levels: Degenerative changes throughout the cervical spine with narrowed interspaces and endplate hypertrophic  changes. Degenerative changes throughout the cervical facet joints. Upper chest: Prominent nodular enlargement of the thyroid gland with left thyroid measuring 4.5 x 3.3 cm diameter. Other: None. IMPRESSION: 1. No acute intracranial abnormalities. Chronic atrophy and small vessel ischemic changes. 2. Normal alignment of the cervical spine. Diffuse degenerative change. No acute displaced fractures identified. 3. Nodular enlargement of the thyroid gland particularly on the left. Suggest follow-up with ultrasound in the elective setting after resolution of acute process. Electronically Signed   By: Burman Nieves M.D.   On: 10/23/2016 02:54   Dg Chest Port 1 View  Result Date: 10/23/2016 CLINICAL DATA:  Generalize weakness and fall tonight. History of CHF, diabetes, hypertension, shortness of breath, former smoker. EXAM: PORTABLE CHEST 1 VIEW COMPARISON:  02/22/2015 FINDINGS: Shallow inspiration. Cardiac enlargement with pulmonary vascular congestion. Diffuse interstitial pattern to the lungs likely represents edema. No blunting of costophrenic angles. No pneumothorax. Calcified and tortuous aorta. Probable esophageal hiatal hernia behind the heart. IMPRESSION: Cardiac enlargement with pulmonary vascular congestion and bilateral interstitial edema. Electronically Signed   By: Burman Nieves M.D.   On: 10/23/2016 01:21    Assessment/Plan There are no diagnoses linked to this encounter.      London Sheer, [place] [phone]   This encounter was created in error - please disregard.

## 2016-11-04 ENCOUNTER — Encounter (HOSPITAL_COMMUNITY)
Admission: RE | Admit: 2016-11-04 | Discharge: 2016-11-04 | Disposition: A | Discharge status: Home / Self Care | Payer: Medicare Other | Source: Skilled Nursing Facility | Attending: Internal Medicine | Admitting: Internal Medicine

## 2016-11-04 DIAGNOSIS — I10 Essential (primary) hypertension: Secondary | ICD-10-CM | POA: Insufficient documentation

## 2016-11-04 LAB — BASIC METABOLIC PANEL
Anion gap: 11 (ref 5–15)
BUN: 27 mg/dL — AB (ref 6–20)
CALCIUM: 9.1 mg/dL (ref 8.9–10.3)
CO2: 27 mmol/L (ref 22–32)
CREATININE: 0.92 mg/dL (ref 0.44–1.00)
Chloride: 99 mmol/L — ABNORMAL LOW (ref 101–111)
GFR calc non Af Amer: 59 mL/min — ABNORMAL LOW (ref >60)
Glucose, Bld: 112 mg/dL — ABNORMAL HIGH (ref 65–99)
Potassium: 4.2 mmol/L (ref 3.5–5.1)
SODIUM: 137 mmol/L (ref 135–145)

## 2016-11-04 LAB — HEMOGLOBIN A1C
Hgb A1c MFr Bld: 7 % — ABNORMAL HIGH (ref 4.8–5.6)
MEAN PLASMA GLUCOSE: 154.2 mg/dL

## 2016-11-04 LAB — CBC
HEMATOCRIT: 38.2 % (ref 36.0–46.0)
Hemoglobin: 12.7 g/dL (ref 12.0–15.0)
MCH: 28.2 pg (ref 26.0–34.0)
MCHC: 33.2 g/dL (ref 30.0–36.0)
MCV: 84.9 fL (ref 78.0–100.0)
Platelets: 291 10*3/uL (ref 150–400)
RBC: 4.5 MIL/uL (ref 3.87–5.11)
RDW: 14 % (ref 11.5–15.5)
WBC: 7.8 10*3/uL (ref 4.0–10.5)

## 2016-11-04 LAB — PROTIME-INR
INR: 1.41
Prothrombin Time: 17.1 seconds — ABNORMAL HIGH (ref 11.4–15.2)

## 2016-11-07 ENCOUNTER — Encounter: Payer: Self-pay | Admitting: Internal Medicine

## 2016-11-07 ENCOUNTER — Non-Acute Institutional Stay (SKILLED_NURSING_FACILITY): Payer: Medicare Other | Admitting: Internal Medicine

## 2016-11-07 ENCOUNTER — Ambulatory Visit: Payer: Medicare Other

## 2016-11-07 ENCOUNTER — Encounter (HOSPITAL_COMMUNITY)
Admission: RE | Admit: 2016-11-07 | Discharge: 2016-11-07 | Disposition: A | Discharge status: Home / Self Care | Payer: Medicare Other | Source: Skilled Nursing Facility | Attending: Internal Medicine | Admitting: Internal Medicine

## 2016-11-07 DIAGNOSIS — I10 Essential (primary) hypertension: Secondary | ICD-10-CM | POA: Diagnosis not present

## 2016-11-07 DIAGNOSIS — I825Z1 Chronic embolism and thrombosis of unspecified deep veins of right distal lower extremity: Secondary | ICD-10-CM

## 2016-11-07 DIAGNOSIS — Z950 Presence of cardiac pacemaker: Secondary | ICD-10-CM

## 2016-11-07 DIAGNOSIS — Z9181 History of falling: Secondary | ICD-10-CM | POA: Insufficient documentation

## 2016-11-07 DIAGNOSIS — Z7901 Long term (current) use of anticoagulants: Secondary | ICD-10-CM | POA: Insufficient documentation

## 2016-11-07 DIAGNOSIS — I5032 Chronic diastolic (congestive) heart failure: Secondary | ICD-10-CM

## 2016-11-07 DIAGNOSIS — Z48812 Encounter for surgical aftercare following surgery on the circulatory system: Secondary | ICD-10-CM | POA: Insufficient documentation

## 2016-11-07 DIAGNOSIS — G47411 Narcolepsy with cataplexy: Secondary | ICD-10-CM | POA: Insufficient documentation

## 2016-11-07 LAB — PROTIME-INR
INR: 1.98
PROTHROMBIN TIME: 22.3 s — AB (ref 11.4–15.2)

## 2016-11-07 NOTE — Progress Notes (Signed)
Location:   [facility] Room Number: 134/P Place of Service:  SNF (31)  Provider: Einar Crow  PCP: [name], MD Patient Care Team: [name], MD as PCP - General  Extended Emergency Contact Information Primary Emergency Contact: Cox,Brenda Address: [address]          [place], [place] Home Phone: [phone] Work Phone: [phone] Mobile Phone: [phone] Relation: Daughter Secondary Emergency Contact: Graylin Shiver Address: [address]          [place], [place] [address] [place] Home Phone: [phone] Mobile Phone: [phone] Relation: Son  Code Status: DNR Goals of care:  Advanced Directive information Advanced Directives 11/07/2016  Does Patient Have a Medical Advance Directive? Yes  Type of Advance Directive Out of facility DNR (pink MOST or yellow form)  Does patient want to make changes to medical advance directive? No - Patient declined  Would patient like information on creating a medical advance directive? No - Patient declined  Pre-existing out of facility DNR order (yellow form or pink MOST form) -     Allergies  Allergen Reactions  . Codeine Anxiety  . Compazine Anaphylaxis  . Other Anaphylaxis, Rash and Other (See Comments)    Uncoded Allergy. Allergen: INOVAR Uncoded Allergy. Allergen: ALL ADHESIVES Uncoded Allergy. Allergen: SILK SUTURE Uncoded Allergy. Allergen: merthiolate Thermasol  . Penicillins Shortness Of Breath and Rash    Has patient had a PCN reaction causing immediate rash, facial/tongue/throat swelling, SOB or lightheadedness with hypotension: Yes Has patient had a PCN reaction causing severe rash involving mucus membranes or skin necrosis: No Has patient had a PCN reaction that required hospitalization No Has patient had a PCN reaction occurring within the last 10 years: Yes If all of the above answers are "NO", then may proceed with  Cephalosporin use.   Marland Kitchen Morphine And Related Other (See Comments)    Patient states that it makes her lose her state of mind.   . Demerol [Meperidine] Rash    Chief Complaint  Patient presents with  . Discharge Note    Discharge Visit    HPI:  76 y.o. female  Seen today for  discharge from the facility.  Patient has h/o Cataplexy, Diastolic CHF, Mild AS and Moderate MS, Diabetes type 2 , DVT on Chronic Coumadin, Hypertension, Recurrent falls recently, Depression and Narcolepsy. She was admitted to the hospital fore Weakness and SOB and recurrent falling. She was found to have high grade Heart Block and Pulmonary edema. Her Diltiazem was stopped and she had Dual chamber PPM placed. She did develop Hematoma post op which got resolved. Patient has done well in facility. Thought she says she has not seen any difference in her Mobility. She is able to walk with the walker but stays in her wheelchair most of the time. She lives with her son who is sole provider.     Past Medical History:  Diagnosis Date  . Aortic stenosis   . Cataplexy   . Diastolic heart failure (HCC)   . Essential hypertension   . GERD (gastroesophageal reflux disease)   . Hiatal hernia   . History of recurrent deep vein thrombosis (DVT)   . Mitral stenosis   . Narcolepsy   . Neuropathy   . Type 2 diabetes mellitus (HCC)     Past Surgical History:  Procedure Laterality Date  . ABDOMINAL HYSTERECTOMY    . CARPAL TUNNEL RELEASE    . CARPECTOMY  12/26/2011  Procedure: CARPECTOMY;  Surgeon: Wyn Forster., MD;  Location: [facility];  Service: Orthopedics;  Laterality: Right;  PROXIMAL ROW CARPECTOMY RIGHT WRIST and neurectomy  . CHOLECYSTECTOMY    . HEMORROIDECTOMY    . KIDNEY SURGERY    . NEPHROSTOMY    . PACEMAKER IMPLANT N/A 10/25/2016   Procedure: Pacemaker Implant;  Surgeon: Regan Lemming, MD;  Location: [facility] INVASIVE CV LAB;  Service: Cardiovascular;  Laterality: N/A;  .  TOTAL HIP ARTHROPLASTY     bilateral      reports that she quit smoking about 28 years ago. She has never used smokeless tobacco. She reports that she does not drink alcohol or use drugs. Social History   Social History  . Marital status: Widowed    Spouse name: N/A  . Number of children: N/A  . Years of education: 75   Occupational History  .  Retired   Social History Main Topics  . Smoking status: Former Smoker    Quit date: 10/13/1988  . Smokeless tobacco: Never Used  . Alcohol use No  . Drug use: No  . Sexual activity: Not on file   Other Topics Concern  . Not on file   Social History Narrative  . No narrative on file   Functional Status Survey:    Allergies  Allergen Reactions  . Codeine Anxiety  . Compazine Anaphylaxis  . Other Anaphylaxis, Rash and Other (See Comments)    Uncoded Allergy. Allergen: INOVAR Uncoded Allergy. Allergen: ALL ADHESIVES Uncoded Allergy. Allergen: SILK SUTURE Uncoded Allergy. Allergen: merthiolate Thermasol  . Penicillins Shortness Of Breath and Rash    Has patient had a PCN reaction causing immediate rash, facial/tongue/throat swelling, SOB or lightheadedness with hypotension: Yes Has patient had a PCN reaction causing severe rash involving mucus membranes or skin necrosis: No Has patient had a PCN reaction that required hospitalization No Has patient had a PCN reaction occurring within the last 10 years: Yes If all of the above answers are "NO", then may proceed with Cephalosporin use.   Marland Kitchen Morphine And Related Other (See Comments)    Patient states that it makes her lose her state of mind.   . Demerol [Meperidine] Rash    Pertinent  Health Maintenance Due  Topic Date Due  . FOOT EXAM  11/28/2016 (Originally 10/21/1950)  . OPHTHALMOLOGY EXAM  11/28/2016 (Originally 10/21/1950)  . URINE MICROALBUMIN  11/28/2016 (Originally 10/21/1950)  . DEXA SCAN  11/28/2016 (Originally 10/21/2004)  . INFLUENZA VACCINE  01/04/2017 (Originally  09/04/2016)  . PNA vac Low Risk Adult (1 of 2 - PCV13) 01/04/2017 (Originally 10/21/2004)  . HEMOGLOBIN A1C  05/05/2017    Medications: Outpatient Encounter Prescriptions as of 11/07/2016  Medication Sig  . BYDUREON 2 MG PEN Inject 2 mg into the skin once a week.  . carvedilol (COREG) 3.125 MG tablet Take 1 tablet (3.125 mg total) by mouth 2 (two) times daily with a meal.  . celecoxib (CELEBREX) 200 MG capsule Take 200 mg by mouth daily. For arthritis  . Cholecalciferol (VITAMIN D3) 3000 UNITS TABS Take 1 capsule by mouth daily.  . clomiPRAMINE (ANAFRANIL) 25 MG capsule Take 25 mg by mouth 4 (four) times daily.   . clonazePAM (KLONOPIN) 1 MG tablet Take 1 mg by mouth at bedtime.   . Coenzyme Q10 (CO Q10) 200 MG CAPS Take 2 capsules by mouth daily.   . DULoxetine (CYMBALTA) 60 MG capsule Take 60 mg by mouth daily.    . Ferrous  Fumarate (FERROCITE) 324 (106 FE) MG TABS Take 1 capsule by mouth daily.   . furosemide (LASIX) 40 MG tablet Take 20 mg by mouth 2 (two) times daily.   Marland Kitchen Hyaluronic Acid-Vitamin C (HYALURONIC ACID PO) Take 100 mg by mouth daily.  . insulin glargine (LANTUS) 100 unit/mL SOPN Inject 24 Units into the skin at bedtime.   . magnesium oxide (MAG-OX) 400 MG tablet Take 400 mg by mouth daily.  . metFORMIN (GLUCOPHAGE) 500 MG tablet Take 500 mg by mouth 2 (two) times daily with a meal.    . mirtazapine (REMERON) 15 MG tablet Take 15 mg by mouth at bedtime.    . potassium chloride (K-DUR) 10 MEQ tablet Take 1 tablet (10 mEq total) by mouth daily.  . Probiotic Product (PROBIOTIC ADVANCED PO) Take 1 capsule by mouth daily.  . QUEtiapine (SEROQUEL) 100 MG tablet Take 100 mg by mouth at bedtime.  Marland Kitchen warfarin (COUMADIN) 5 MG tablet Take 5 mg by mouth every evening. Starting 10/29/2016 take 2.5 mg alternating with 5 mg daily    No facility-administered encounter medications on file as of 11/07/2016.      Review of Systems  Vitals:   11/07/16 1252  BP: (!) 142/72  Pulse: 73  Resp:  20  Temp: (!) 97.2 F (36.2 C)  TempSrc: Oral   There is no height or weight on file to calculate BMI. Physical Exam  Constitutional: She is oriented to person, place, and time. She appears well-developed and well-nourished.  HENT:  Head: Normocephalic.  Eyes: Pupils are equal, round, and reactive to light.  Neck: Neck supple.  Cardiovascular: Normal rate and normal heart sounds.   Pulmonary/Chest: Effort normal and breath sounds normal. No respiratory distress. She has no wheezes. She has no rales.  Abdominal: Soft. Bowel sounds are normal. She exhibits no distension. There is no tenderness. There is no rebound.  Musculoskeletal:  Trace edema left more then right Chronic Venous changes  Neurological: She is alert and oriented to person, place, and time.  Skin: Skin is warm and dry.  Psychiatric: She has a normal mood and affect. Her behavior is normal.    Labs reviewed: Basic Metabolic Panel:  Recent Labs  81/19/14 0319 10/28/16 0511 11/04/16 0700  NA 133* 134* 137  K 3.9 4.0 4.2  CL 96* 98* 99*  CO2 GLUCOSE 194* 198* 112*  BUN 30* 30* 27*  CREATININE 0.98 1.07* 0.92  CALCIUM 9.1 9.0 9.1   Liver Function Tests:  Recent Labs  10/23/16 0053  AST 21  ALT 19  ALKPHOS 60  BILITOT 0.5  PROT 7.7  ALBUMIN 3.3*   No results for input(s): LIPASE, AMYLASE in the last 8760 hours. No results for input(s): AMMONIA in the last 8760 hours. CBC:  Recent Labs  09/24/16 1518 10/23/16 0053  10/27/16 0220 10/27/16 1742 10/28/16 0319 11/04/16 0700  WBC 8.4 10.2  < > 7.8  --  9.8 7.8  NEUTROABS 5.8 6.2  --   --   --   --   --   HGB 14.0 13.1  < > 9.9* 14.1 12.8 12.7  HCT 42.7 38.6  < > 29.9* 41.4 37.8 38.2  MCV 89.5 85.0  < > 86.2  --  83.3 84.9  PLT 267 355  < > 286  --  349 291  < > = values in this interval not displayed. Cardiac Enzymes:  Recent Labs  10/23/16 0053 10/23/16 0501  TROPONINI <0.03 <0.03  BNP: Invalid input(s):  POCBNP CBG:  Recent Labs  10/23/16 0128 10/23/16 1047 10/25/16 1824  GLUCAP 117* 121* 164*    Procedures and Imaging Studies During Stay: Dg Chest 2 View  Result Date: 10/26/2016 CLINICAL DATA:  Recent pacemaker placement EXAM: CHEST  2 VIEW COMPARISON:  10/23/2016 FINDINGS: Cardiac shadow is mildly enlarged. Pacing device is noted. No pneumothorax is seen. Vascular congestion and interstitial edema is noted although improved from the prior exam. No bony abnormality is seen. IMPRESSION: No pneumothorax following pacemaker placement. Improving interstitial edema. Electronically Signed   By: Alcide Clever M.D.   On: 10/26/2016 07:47   Dg Shoulder Right  Result Date: 10/25/2016 CLINICAL DATA:  Pain following fall EXAM: RIGHT SHOULDER - 2+ VIEW COMPARISON:  None. FINDINGS: Frontal and Y scapular images were obtained. No evident fracture or dislocation. There is mild narrowing of the glenohumeral joint. The acromioclavicular joint appears normal. No erosive change or intra-articular calcification. Visualized right lung is clear. IMPRESSION: Narrowing of the glenohumeral joint. Acromioclavicular joint unremarkable. No fracture or dislocation. Electronically Signed   By: Bretta Bang III M.D.   On: 10/25/2016 10:45   Ct Head Wo Contrast  Result Date: 10/23/2016 CLINICAL DATA:  Altered level of consciousness.  Fall tonight. EXAM: CT HEAD WITHOUT CONTRAST CT CERVICAL SPINE WITHOUT CONTRAST TECHNIQUE: Multidetector CT imaging of the head and cervical spine was performed following the standard protocol without intravenous contrast. Multiplanar CT image reconstructions of the cervical spine were also generated. COMPARISON:  CT head 12/14/2005 FINDINGS: CT HEAD FINDINGS Brain: Diffuse cerebral atrophy. Mild ventricular dilatation consistent with central atrophy. Low-attenuation changes in the deep white matter consistent with small vessel ischemia. No mass effect or midline shift. No abnormal  extra-axial fluid collections. Gray-white matter junctions are distinct. Basal cisterns are not effaced. No acute intracranial hemorrhage. Vascular: Vascular calcifications in the internal carotid arteries. Skull: No depressed skull fractures. Sinuses/Orbits: Paranasal sinuses and mastoid air cells are clear. Other: None. CT CERVICAL SPINE FINDINGS Alignment: Examination is limited due to motion artifact. Normal alignment of the cervical vertebrae and facet joints. C1-2 articulation appears intact. Skull base and vertebrae: No acute fracture. No primary bone lesion or focal pathologic process. Soft tissues and spinal canal: No prevertebral fluid or swelling. No visible canal hematoma. Disc levels: Degenerative changes throughout the cervical spine with narrowed interspaces and endplate hypertrophic changes. Degenerative changes throughout the cervical facet joints. Upper chest: Prominent nodular enlargement of the thyroid gland with left thyroid measuring 4.5 x 3.3 cm diameter. Other: None. IMPRESSION: 1. No acute intracranial abnormalities. Chronic atrophy and small vessel ischemic changes. 2. Normal alignment of the cervical spine. Diffuse degenerative change. No acute displaced fractures identified. 3. Nodular enlargement of the thyroid gland particularly on the left. Suggest follow-up with ultrasound in the elective setting after resolution of acute process. Electronically Signed   By: Burman Nieves M.D.   On: 10/23/2016 02:54   Ct Cervical Spine Wo Contrast  Result Date: 10/23/2016 CLINICAL DATA:  Altered level of consciousness.  Fall tonight. EXAM: CT HEAD WITHOUT CONTRAST CT CERVICAL SPINE WITHOUT CONTRAST TECHNIQUE: Multidetector CT imaging of the head and cervical spine was performed following the standard protocol without intravenous contrast. Multiplanar CT image reconstructions of the cervical spine were also generated. COMPARISON:  CT head 12/14/2005 FINDINGS: CT HEAD FINDINGS Brain: Diffuse  cerebral atrophy. Mild ventricular dilatation consistent with central atrophy. Low-attenuation changes in the deep white matter consistent with small vessel ischemia. No mass effect or midline shift.  No abnormal extra-axial fluid collections. Gray-white matter junctions are distinct. Basal cisterns are not effaced. No acute intracranial hemorrhage. Vascular: Vascular calcifications in the internal carotid arteries. Skull: No depressed skull fractures. Sinuses/Orbits: Paranasal sinuses and mastoid air cells are clear. Other: None. CT CERVICAL SPINE FINDINGS Alignment: Examination is limited due to motion artifact. Normal alignment of the cervical vertebrae and facet joints. C1-2 articulation appears intact. Skull base and vertebrae: No acute fracture. No primary bone lesion or focal pathologic process. Soft tissues and spinal canal: No prevertebral fluid or swelling. No visible canal hematoma. Disc levels: Degenerative changes throughout the cervical spine with narrowed interspaces and endplate hypertrophic changes. Degenerative changes throughout the cervical facet joints. Upper chest: Prominent nodular enlargement of the thyroid gland with left thyroid measuring 4.5 x 3.3 cm diameter. Other: None. IMPRESSION: 1. No acute intracranial abnormalities. Chronic atrophy and small vessel ischemic changes. 2. Normal alignment of the cervical spine. Diffuse degenerative change. No acute displaced fractures identified. 3. Nodular enlargement of the thyroid gland particularly on the left. Suggest follow-up with ultrasound in the elective setting after resolution of acute process. Electronically Signed   By: Burman Nieves M.D.   On: 10/23/2016 02:54   Dg Chest Port 1 View  Result Date: 10/23/2016 CLINICAL DATA:  Generalize weakness and fall tonight. History of CHF, diabetes, hypertension, shortness of breath, former smoker. EXAM: PORTABLE CHEST 1 VIEW COMPARISON:  02/22/2015 FINDINGS: Shallow inspiration. Cardiac  enlargement with pulmonary vascular congestion. Diffuse interstitial pattern to the lungs likely represents edema. No blunting of costophrenic angles. No pneumothorax. Calcified and tortuous aorta. Probable esophageal hiatal hernia behind the heart. IMPRESSION: Cardiac enlargement with pulmonary vascular congestion and bilateral interstitial edema. Electronically Signed   By: Burman Nieves M.D.   On: 10/23/2016 01:21    Assessment/Plan:    Chronic Diastolic failure Stable Continue same dose of lasix Follow up with cardiology and her PCP S/p Placement of Dual chamber pacemaker Has follow up tomorrow with Cardiology  Essential Hypertension   She is  off her diltiazem.  Close follow up as outpatient  Primary narcolepsy with cataplexy and depression Patient has been on number of medications including Remeron, Cymbalta, Seroquel and Clomipramine, Klonopin .D/W patient to taper some meds especially if she continues to have falls.  Diabetes Mellitus BS were controlled in facility after change in Lantus Continue Lantus, Bydureon and Metformin A1C was 7  Chronic DVT Coumadin adjusted slowly for INR above 2 Repeat INR on Mon.  ? Thyroid nodule on CTscan Will order Thyroid US as out patient  With Follow up with her PCP Disposition Going home with her son. Future labs/tests needed:   BMP, CBC, And INR [place] of Thyroid to follow the CT scan  All questions answered. Patient will follow with PCP.and Cardiology

## 2016-11-08 ENCOUNTER — Ambulatory Visit (INDEPENDENT_AMBULATORY_CARE_PROVIDER_SITE_OTHER): Payer: Medicare Other | Admitting: *Deleted

## 2016-11-08 DIAGNOSIS — I442 Atrioventricular block, complete: Secondary | ICD-10-CM | POA: Diagnosis not present

## 2016-11-08 LAB — CUP PACEART INCLINIC DEVICE CHECK
Battery Remaining Longevity: 143 mo
Brady Statistic AP VS Percent: 0 %
Brady Statistic AS VS Percent: 0.2 %
Implantable Lead Implant Date: 20180921
Implantable Lead Location: 753859
Implantable Lead Model: 5076
Lead Channel Impedance Value: 342 Ohm
Lead Channel Pacing Threshold Amplitude: 0.5 V
Lead Channel Pacing Threshold Pulse Width: 0.4 ms
Lead Channel Sensing Intrinsic Amplitude: 17.625 mV
Lead Channel Sensing Intrinsic Amplitude: 3.375 mV
Lead Channel Setting Pacing Amplitude: 3.5 V
Lead Channel Setting Pacing Pulse Width: 0.4 ms
MDC IDC LEAD IMPLANT DT: 20180921
MDC IDC LEAD LOCATION: 753860
MDC IDC MSMT BATTERY VOLTAGE: 3.21 V
MDC IDC MSMT LEADCHNL RA IMPEDANCE VALUE: 456 Ohm
MDC IDC MSMT LEADCHNL RA PACING THRESHOLD AMPLITUDE: 0.625 V
MDC IDC MSMT LEADCHNL RA SENSING INTR AMPL: 2 mV
MDC IDC MSMT LEADCHNL RV IMPEDANCE VALUE: 418 Ohm
MDC IDC MSMT LEADCHNL RV IMPEDANCE VALUE: 589 Ohm
MDC IDC MSMT LEADCHNL RV PACING THRESHOLD PULSEWIDTH: 0.4 ms
MDC IDC PG IMPLANT DT: 20180921
MDC IDC SESS DTM: 20181005105708
MDC IDC SET LEADCHNL RA PACING AMPLITUDE: 3.5 V
MDC IDC SET LEADCHNL RV SENSING SENSITIVITY: 1.2 mV
MDC IDC STAT BRADY AP VP PERCENT: 3.4 %
MDC IDC STAT BRADY AS VP PERCENT: 96.4 %
MDC IDC STAT BRADY RA PERCENT PACED: 3.37 %
MDC IDC STAT BRADY RV PERCENT PACED: 99.79 %

## 2016-11-08 NOTE — Progress Notes (Signed)
Wound check appointment. Steri-strips removed. Wound without redness or edema. Incision edges approximated, wound well healed. Normal device function. Thresholds, sensing, and impedances consistent with implant measurements. Device programmed at 3.5V for extra safety margin until 3 month visit. Histogram distribution appropriate for patient and level of activity. No mode switches or high ventricular rates noted. Patient educated about wound care, arm mobility, lifting restrictions. ROV in 3 months with GT in [place] - will message schedule.

## 2016-11-11 ENCOUNTER — Encounter (HOSPITAL_COMMUNITY)
Admission: RE | Admit: 2016-11-11 | Discharge: 2016-11-11 | Disposition: A | Discharge status: Home / Self Care | Payer: Medicare Other | Source: Skilled Nursing Facility | Attending: *Deleted | Admitting: *Deleted

## 2016-12-23 ENCOUNTER — Encounter: Payer: Self-pay | Admitting: Cardiovascular Disease

## 2016-12-23 ENCOUNTER — Ambulatory Visit: Payer: Medicare Other | Admitting: Cardiovascular Disease

## 2017-01-06 ENCOUNTER — Ambulatory Visit: Payer: Medicare Other

## 2017-03-03 ENCOUNTER — Encounter (HOSPITAL_COMMUNITY): Payer: Self-pay | Admitting: Emergency Medicine

## 2017-03-03 ENCOUNTER — Inpatient Hospital Stay (HOSPITAL_COMMUNITY)
Admission: EM | Admit: 2017-03-03 | Discharge: 2017-03-12 | DRG: 603 | Disposition: A | Discharge status: Home Health | Payer: Medicare Other | Attending: Family Medicine | Admitting: Family Medicine

## 2017-03-03 ENCOUNTER — Emergency Department (HOSPITAL_COMMUNITY): Payer: Medicare Other

## 2017-03-03 ENCOUNTER — Other Ambulatory Visit: Payer: Self-pay

## 2017-03-03 DIAGNOSIS — Z794 Long term (current) use of insulin: Secondary | ICD-10-CM

## 2017-03-03 DIAGNOSIS — E1165 Type 2 diabetes mellitus with hyperglycemia: Secondary | ICD-10-CM

## 2017-03-03 DIAGNOSIS — L03116 Cellulitis of left lower limb: Secondary | ICD-10-CM

## 2017-03-03 DIAGNOSIS — Z7901 Long term (current) use of anticoagulants: Secondary | ICD-10-CM

## 2017-03-03 DIAGNOSIS — K2100 Gastro-esophageal reflux disease with esophagitis, without bleeding: Secondary | ICD-10-CM

## 2017-03-03 DIAGNOSIS — F320 Major depressive disorder, single episode, mild: Secondary | ICD-10-CM

## 2017-03-03 DIAGNOSIS — I10 Essential (primary) hypertension: Secondary | ICD-10-CM | POA: Diagnosis present

## 2017-03-03 DIAGNOSIS — K760 Fatty (change of) liver, not elsewhere classified: Secondary | ICD-10-CM

## 2017-03-03 DIAGNOSIS — D500 Iron deficiency anemia secondary to blood loss (chronic): Secondary | ICD-10-CM

## 2017-03-03 DIAGNOSIS — Z8249 Family history of ischemic heart disease and other diseases of the circulatory system: Secondary | ICD-10-CM | POA: Diagnosis not present

## 2017-03-03 DIAGNOSIS — I080 Rheumatic disorders of both mitral and aortic valves: Secondary | ICD-10-CM | POA: Diagnosis present

## 2017-03-03 DIAGNOSIS — L0390 Cellulitis, unspecified: Secondary | ICD-10-CM | POA: Diagnosis present

## 2017-03-03 DIAGNOSIS — Z79899 Other long term (current) drug therapy: Secondary | ICD-10-CM | POA: Diagnosis not present

## 2017-03-03 DIAGNOSIS — K122 Cellulitis and abscess of mouth: Secondary | ICD-10-CM

## 2017-03-03 DIAGNOSIS — Z888 Allergy status to other drugs, medicaments and biological substances status: Secondary | ICD-10-CM | POA: Diagnosis not present

## 2017-03-03 DIAGNOSIS — E1169 Type 2 diabetes mellitus with other specified complication: Secondary | ICD-10-CM | POA: Diagnosis not present

## 2017-03-03 DIAGNOSIS — I350 Nonrheumatic aortic (valve) stenosis: Secondary | ICD-10-CM

## 2017-03-03 DIAGNOSIS — G47411 Narcolepsy with cataplexy: Secondary | ICD-10-CM | POA: Diagnosis present

## 2017-03-03 DIAGNOSIS — I130 Hypertensive heart and chronic kidney disease with heart failure and stage 1 through stage 4 chronic kidney disease, or unspecified chronic kidney disease: Secondary | ICD-10-CM | POA: Diagnosis present

## 2017-03-03 DIAGNOSIS — K219 Gastro-esophageal reflux disease without esophagitis: Secondary | ICD-10-CM | POA: Diagnosis present

## 2017-03-03 DIAGNOSIS — Z880 Allergy status to penicillin: Secondary | ICD-10-CM

## 2017-03-03 DIAGNOSIS — Z885 Allergy status to narcotic agent status: Secondary | ICD-10-CM | POA: Diagnosis not present

## 2017-03-03 DIAGNOSIS — I050 Rheumatic mitral stenosis: Secondary | ICD-10-CM

## 2017-03-03 DIAGNOSIS — Z90710 Acquired absence of both cervix and uterus: Secondary | ICD-10-CM | POA: Diagnosis not present

## 2017-03-03 DIAGNOSIS — N183 Chronic kidney disease, stage 3 unspecified: Secondary | ICD-10-CM | POA: Diagnosis present

## 2017-03-03 DIAGNOSIS — Z87891 Personal history of nicotine dependence: Secondary | ICD-10-CM | POA: Diagnosis not present

## 2017-03-03 DIAGNOSIS — I5032 Chronic diastolic (congestive) heart failure: Secondary | ICD-10-CM | POA: Diagnosis present

## 2017-03-03 DIAGNOSIS — G47421 Narcolepsy in conditions classified elsewhere with cataplexy: Secondary | ICD-10-CM

## 2017-03-03 DIAGNOSIS — E1122 Type 2 diabetes mellitus with diabetic chronic kidney disease: Secondary | ICD-10-CM | POA: Diagnosis present

## 2017-03-03 DIAGNOSIS — E1142 Type 2 diabetes mellitus with diabetic polyneuropathy: Secondary | ICD-10-CM | POA: Diagnosis present

## 2017-03-03 DIAGNOSIS — M79605 Pain in left leg: Secondary | ICD-10-CM

## 2017-03-03 DIAGNOSIS — Z950 Presence of cardiac pacemaker: Secondary | ICD-10-CM

## 2017-03-03 DIAGNOSIS — L03032 Cellulitis of left toe: Secondary | ICD-10-CM

## 2017-03-03 DIAGNOSIS — Z86718 Personal history of other venous thrombosis and embolism: Secondary | ICD-10-CM | POA: Diagnosis not present

## 2017-03-03 DIAGNOSIS — I825Z1 Chronic embolism and thrombosis of unspecified deep veins of right distal lower extremity: Secondary | ICD-10-CM

## 2017-03-03 LAB — COMPREHENSIVE METABOLIC PANEL
ALT: 16 U/L (ref 14–54)
AST: 20 U/L (ref 15–41)
Albumin: 3.4 g/dL — ABNORMAL LOW (ref 3.5–5.0)
Alkaline Phosphatase: 67 U/L (ref 38–126)
Anion gap: 13 (ref 5–15)
BUN: 26 mg/dL — AB (ref 6–20)
CHLORIDE: 98 mmol/L — AB (ref 101–111)
CO2: 23 mmol/L (ref 22–32)
Calcium: 9.1 mg/dL (ref 8.9–10.3)
Creatinine, Ser: 1.18 mg/dL — ABNORMAL HIGH (ref 0.44–1.00)
GFR calc Af Amer: 50 mL/min — ABNORMAL LOW (ref >60)
GFR calc non Af Amer: 43 mL/min — ABNORMAL LOW (ref >60)
GLUCOSE: 245 mg/dL — AB (ref 65–99)
POTASSIUM: 4.1 mmol/L (ref 3.5–5.1)
SODIUM: 134 mmol/L — AB (ref 135–145)
Total Bilirubin: 0.7 mg/dL (ref 0.3–1.2)
Total Protein: 7.6 g/dL (ref 6.5–8.1)

## 2017-03-03 LAB — GLUCOSE, CAPILLARY: GLUCOSE-CAPILLARY: 215 mg/dL — AB (ref 65–99)

## 2017-03-03 LAB — CBC WITH DIFFERENTIAL/PLATELET
Basophils Absolute: 0 10*3/uL (ref 0.0–0.1)
Basophils Relative: 0 %
EOS PCT: 3 %
Eosinophils Absolute: 0.4 10*3/uL (ref 0.0–0.7)
HEMATOCRIT: 40.8 % (ref 36.0–46.0)
Hemoglobin: 13.3 g/dL (ref 12.0–15.0)
LYMPHS ABS: 1.6 10*3/uL (ref 0.7–4.0)
LYMPHS PCT: 13 %
MCH: 29 pg (ref 26.0–34.0)
MCHC: 32.6 g/dL (ref 30.0–36.0)
MCV: 88.9 fL (ref 78.0–100.0)
MONO ABS: 1 10*3/uL (ref 0.1–1.0)
Monocytes Relative: 8 %
Neutro Abs: 9.4 10*3/uL — ABNORMAL HIGH (ref 1.7–7.7)
Neutrophils Relative %: 76 %
PLATELETS: 250 10*3/uL (ref 150–400)
RBC: 4.59 MIL/uL (ref 3.87–5.11)
RDW: 14.4 % (ref 11.5–15.5)
WBC: 12.3 10*3/uL — ABNORMAL HIGH (ref 4.0–10.5)

## 2017-03-03 LAB — I-STAT CG4 LACTIC ACID, ED: LACTIC ACID, VENOUS: 1.08 mmol/L (ref 0.5–1.9)

## 2017-03-03 LAB — PROTIME-INR
INR: 2.28
Prothrombin Time: 25 seconds — ABNORMAL HIGH (ref 11.4–15.2)

## 2017-03-03 MED ORDER — CARVEDILOL 3.125 MG PO TABS
3.1250 mg | ORAL_TABLET | Freq: Two times a day (BID) | ORAL | Status: DC
  Administered 2017-03-03 – 2017-03-12 (×18): 3.125 mg via ORAL
  Filled 2017-03-03 (×18): qty 1

## 2017-03-03 MED ORDER — CLOMIPRAMINE HCL 25 MG PO CAPS
25.0000 mg | ORAL_CAPSULE | Freq: Four times a day (QID) | ORAL | Status: DC
  Administered 2017-03-03 – 2017-03-11 (×32): 25 mg via ORAL
  Filled 2017-03-03 (×45): qty 1

## 2017-03-03 MED ORDER — VANCOMYCIN HCL IN DEXTROSE 1-5 GM/200ML-% IV SOLN
1000.0000 mg | Freq: Once | INTRAVENOUS | Status: AC
  Administered 2017-03-03: 1000 mg via INTRAVENOUS
  Filled 2017-03-03: qty 200

## 2017-03-03 MED ORDER — ONDANSETRON HCL 4 MG/2ML IJ SOLN
4.0000 mg | Freq: Four times a day (QID) | INTRAMUSCULAR | Status: DC | PRN
Start: 2017-03-03 — End: 2017-03-12

## 2017-03-03 MED ORDER — ACETAMINOPHEN 650 MG RE SUPP: 650.0000 mg | Freq: Four times a day (QID) | RECTAL | Status: DC | PRN

## 2017-03-03 MED ORDER — CELECOXIB 100 MG PO CAPS
200.0000 mg | ORAL_CAPSULE | Freq: Every day | ORAL | Status: DC
  Administered 2017-03-04 – 2017-03-05 (×2): 200 mg via ORAL
  Filled 2017-03-03 (×2): qty 2

## 2017-03-03 MED ORDER — POTASSIUM CHLORIDE CRYS ER 10 MEQ PO TBCR
10.0000 meq | EXTENDED_RELEASE_TABLET | Freq: Every day | ORAL | Status: DC
  Administered 2017-03-03 – 2017-03-12 (×10): 10 meq via ORAL
  Filled 2017-03-03 (×13): qty 1

## 2017-03-03 MED ORDER — VANCOMYCIN HCL IN DEXTROSE 1-5 GM/200ML-% IV SOLN: 1000.0000 mg | Freq: Once | INTRAVENOUS | Status: DC

## 2017-03-03 MED ORDER — INSULIN ASPART 100 UNIT/ML ~~LOC~~ SOLN
0.0000 [IU] | Freq: Every day | SUBCUTANEOUS | Status: DC
  Administered 2017-03-03: 2 [IU] via SUBCUTANEOUS
  Administered 2017-03-05: 4 [IU] via SUBCUTANEOUS
  Administered 2017-03-10: 2 [IU] via SUBCUTANEOUS

## 2017-03-03 MED ORDER — VANCOMYCIN HCL 10 G IV SOLR
1250.0000 mg | INTRAVENOUS | Status: DC
  Administered 2017-03-04 – 2017-03-09 (×6): 1250 mg via INTRAVENOUS
  Filled 2017-03-03 (×7): qty 1250

## 2017-03-03 MED ORDER — ONDANSETRON HCL 4 MG PO TABS: 4.0000 mg | ORAL_TABLET | Freq: Four times a day (QID) | ORAL | Status: DC | PRN

## 2017-03-03 MED ORDER — ACETAMINOPHEN 325 MG PO TABS
650.0000 mg | ORAL_TABLET | Freq: Four times a day (QID) | ORAL | Status: DC | PRN
  Administered 2017-03-07 – 2017-03-12 (×4): 650 mg via ORAL
  Filled 2017-03-03 (×5): qty 2

## 2017-03-03 MED ORDER — WARFARIN - PHARMACIST DOSING INPATIENT
Status: DC
  Administered 2017-03-05 – 2017-03-11 (×7)

## 2017-03-03 MED ORDER — QUETIAPINE FUMARATE 100 MG PO TABS
100.0000 mg | ORAL_TABLET | Freq: Every day | ORAL | Status: DC
  Administered 2017-03-03 – 2017-03-11 (×9): 100 mg via ORAL
  Filled 2017-03-03 (×9): qty 1

## 2017-03-03 MED ORDER — FUROSEMIDE 20 MG PO TABS
20.0000 mg | ORAL_TABLET | Freq: Two times a day (BID) | ORAL | Status: DC
  Administered 2017-03-03 – 2017-03-12 (×18): 20 mg via ORAL
  Filled 2017-03-03 (×18): qty 1

## 2017-03-03 MED ORDER — DULOXETINE HCL 60 MG PO CPEP
60.0000 mg | ORAL_CAPSULE | Freq: Every day | ORAL | Status: DC
  Administered 2017-03-04 – 2017-03-12 (×9): 60 mg via ORAL
  Filled 2017-03-03 (×9): qty 1

## 2017-03-03 MED ORDER — VANCOMYCIN HCL 10 G IV SOLR
1500.0000 mg | INTRAVENOUS | Status: DC
  Filled 2017-03-03: qty 1500

## 2017-03-03 MED ORDER — MIRTAZAPINE 15 MG PO TABS
15.0000 mg | ORAL_TABLET | Freq: Every day | ORAL | Status: DC
  Administered 2017-03-03 – 2017-03-11 (×9): 15 mg via ORAL
  Filled 2017-03-03 (×9): qty 1

## 2017-03-03 MED ORDER — INSULIN ASPART 100 UNIT/ML ~~LOC~~ SOLN
0.0000 [IU] | Freq: Three times a day (TID) | SUBCUTANEOUS | Status: DC
  Administered 2017-03-04: 2 [IU] via SUBCUTANEOUS
  Administered 2017-03-04: 3 [IU] via SUBCUTANEOUS
  Administered 2017-03-04: 5 [IU] via SUBCUTANEOUS
  Administered 2017-03-05 – 2017-03-06 (×4): 2 [IU] via SUBCUTANEOUS
  Administered 2017-03-07: 5 [IU] via SUBCUTANEOUS
  Administered 2017-03-07: 2 [IU] via SUBCUTANEOUS
  Administered 2017-03-08: 3 [IU] via SUBCUTANEOUS
  Administered 2017-03-08 – 2017-03-09 (×3): 2 [IU] via SUBCUTANEOUS
  Administered 2017-03-09: 1 [IU] via SUBCUTANEOUS
  Administered 2017-03-09 – 2017-03-10 (×2): 5 [IU] via SUBCUTANEOUS
  Administered 2017-03-10 (×2): 2 [IU] via SUBCUTANEOUS
  Administered 2017-03-11: 5 [IU] via SUBCUTANEOUS
  Administered 2017-03-11: 2 [IU] via SUBCUTANEOUS
  Administered 2017-03-12 (×2): 3 [IU] via SUBCUTANEOUS

## 2017-03-03 MED ORDER — INSULIN GLARGINE 100 UNIT/ML ~~LOC~~ SOLN
24.0000 [IU] | Freq: Every day | SUBCUTANEOUS | Status: DC
  Administered 2017-03-03 – 2017-03-05 (×3): 24 [IU] via SUBCUTANEOUS
  Filled 2017-03-03 (×4): qty 0.24

## 2017-03-03 MED ORDER — CLONAZEPAM 0.5 MG PO TABS
1.0000 mg | ORAL_TABLET | Freq: Every day | ORAL | Status: DC
  Administered 2017-03-03 – 2017-03-11 (×9): 1 mg via ORAL
  Filled 2017-03-03 (×9): qty 2

## 2017-03-03 MED ORDER — WARFARIN SODIUM 2.5 MG PO TABS
2.5000 mg | ORAL_TABLET | Freq: Once | ORAL | Status: AC
  Administered 2017-03-03: 2.5 mg via ORAL
  Filled 2017-03-03: qty 1

## 2017-03-03 NOTE — Progress Notes (Signed)
Pharmacy Antibiotic Note  Rebecca [name] is a 77 y.o. female admitted on 03/03/2017 with cellulitis.  Pharmacy has been consulted for Vancomycin dosing.  Plan: Vancomycin 1500 IV every 24 hours.  Goal trough 10-15 mcg/mL.  Monitor labs, c/s, and vanco trough as needed  Height: 5\' 8"  (172.7 cm) Weight: 210 lb (95.3 kg) IBW/kg (Calculated) : 63.9  Temp (24hrs), Avg:98.3 F (36.8 C), Min:98.3 F (36.8 C), Max:98.3 F (36.8 C)  Recent Labs  Lab 03/03/17 1040  WBC 12.3*  CREATININE 1.18*    Estimated Creatinine Clearance: 48.2 mL/min (A) (by C-G formula based on SCr of 1.18 mg/dL (H)).    Allergies  Allergen Reactions  . Codeine Anxiety  . Compazine Anaphylaxis  . Other Anaphylaxis, Rash and Other (See Comments)    Uncoded Allergy. Allergen: INOVAR Uncoded Allergy. Allergen: ALL ADHESIVES Uncoded Allergy. Allergen: SILK SUTURE Uncoded Allergy. Allergen: merthiolate Thermasol  . Penicillins Shortness Of Breath and Rash    Has patient had a PCN reaction causing immediate rash, facial/tongue/throat swelling, SOB or lightheadedness with hypotension: Yes Has patient had a PCN reaction causing severe rash involving mucus membranes or skin necrosis: No Has patient had a PCN reaction that required hospitalization No Has patient had a PCN reaction occurring within the last 10 years: Yes If all of the above answers are "NO", then may proceed with Cephalosporin use.   Marland Kitchen Morphine And Related Other (See Comments)    Patient states that it makes her lose her state of mind.   . Demerol [Meperidine] Rash    Antimicrobials this admission: 1/28 Vanco >>      Dose adjustments this admission: N/A  Microbiology results: 1/28 BCx: pending  Thank you for allowing pharmacy to be a part of this patient's care.   Judeth Cornfield, PharmD Clinical Pharmacist 03/03/2017 11:32 AM

## 2017-03-03 NOTE — Progress Notes (Signed)
ANTICOAGULATION CONSULT NOTE - Initial Consult  Pharmacy Consult for Warfarin Indication: Hx DVT  Allergies  Allergen Reactions  . Codeine Anxiety  . Compazine Anaphylaxis  . Other Anaphylaxis, Rash and Other (See Comments)    Uncoded Allergy. Allergen: INOVAR Uncoded Allergy. Allergen: ALL ADHESIVES Uncoded Allergy. Allergen: SILK SUTURE Uncoded Allergy. Allergen: merthiolate Thermasol  . Penicillins Shortness Of Breath and Rash    Has patient had a PCN reaction causing immediate rash, facial/tongue/throat swelling, SOB or lightheadedness with hypotension: Yes Has patient had a PCN reaction causing severe rash involving mucus membranes or skin necrosis: No Has patient had a PCN reaction that required hospitalization No Has patient had a PCN reaction occurring within the last 10 years: Yes If all of the above answers are "NO", then may proceed with Cephalosporin use.   Marland Kitchen Morphine And Related Other (See Comments)    Patient states that it makes her lose her state of mind.   . Demerol [Meperidine] Rash    Patient Measurements: Height: 5\' 8"  (172.7 cm) Weight: 225 lb 5 oz (102.2 kg) IBW/kg (Calculated) : 63.9 Heparin Dosing Weight:   Vital Signs: Temp: 98 F (36.7 C) (01/28 1639) Temp Source: Oral (01/28 1639) BP: 120/54 (01/28 1639) Pulse Rate: 118 (01/28 1639)  Labs: Recent Labs    03/03/17 1040  HGB 13.3  HCT 40.8  PLT 250  LABPROT 25.0*  INR 2.28  CREATININE 1.18*    Estimated Creatinine Clearance: 49.9 mL/min (A) (by C-G formula based on SCr of 1.18 mg/dL (H)).   Medical History: Past Medical History:  Diagnosis Date  . Aortic stenosis   . Cataplexy   . Diastolic heart failure (HCC)   . Essential hypertension   . GERD (gastroesophageal reflux disease)   . Hiatal hernia   . History of recurrent deep vein thrombosis (DVT)   . Mitral stenosis   . Narcolepsy   . Neuropathy   . Type 2 diabetes mellitus (HCC)     Medications:  Medications Prior to  Admission  Medication Sig Dispense Refill Last Dose  . BYDUREON 2 MG PEN Inject 2 mg into the skin once a week.  5 Past Week at Unknown time  . carvedilol (COREG) 3.125 MG tablet Take 1 tablet (3.125 mg total) by mouth 2 (two) times daily with a meal. 60 tablet 6 Taking  . celecoxib (CELEBREX) 200 MG capsule Take 200 mg by mouth daily. For arthritis   Taking  . Cholecalciferol (VITAMIN D3) 3000 UNITS TABS Take 1 capsule by mouth daily.   Taking  . clomiPRAMINE (ANAFRANIL) 25 MG capsule Take 25 mg by mouth 4 (four) times daily.    Taking  . clonazePAM (KLONOPIN) 1 MG tablet Take 1 mg by mouth at bedtime.    Taking  . Coenzyme Q10 (CO Q10) 200 MG CAPS Take 2 capsules by mouth daily.    Taking  . DULoxetine (CYMBALTA) 60 MG capsule Take 60 mg by mouth daily.     Taking  . Ferrous Fumarate (FERROCITE) 324 (106 FE) MG TABS Take 1 capsule by mouth daily.    Taking  . furosemide (LASIX) 40 MG tablet Take 20 mg by mouth 2 (two) times daily.   5 Taking  . Hyaluronic Acid-Vitamin C (HYALURONIC ACID PO) Take 100 mg by mouth daily.   Taking  . insulin glargine (LANTUS) 100 unit/mL SOPN Inject 24 Units into the skin at bedtime.    Taking  . magnesium oxide (MAG-OX) 400 MG tablet Take 400 mg  by mouth daily.   Taking  . metFORMIN (GLUCOPHAGE) 500 MG tablet Take 500 mg by mouth 2 (two) times daily with a meal.     Taking  . mirtazapine (REMERON) 15 MG tablet Take 15 mg by mouth at bedtime.     Taking  . potassium chloride (K-DUR) 10 MEQ tablet Take 1 tablet (10 mEq total) by mouth daily. 90 tablet 3 Taking  . Probiotic Product (PROBIOTIC ADVANCED PO) Take 1 capsule by mouth daily.   Taking  . QUEtiapine (SEROQUEL) 100 MG tablet Take 100 mg by mouth at bedtime.   Taking  . warfarin (COUMADIN) 5 MG tablet Take 5 mg by mouth every evening. Starting 10/29/2016 take 2.5 mg alternating with 5 mg daily    Taking   Assessment: Okay for Protocol, INR at goal.  Current regiment awaiting confirmation from facility vs.  Retail Pharmacy  Goal of Therapy:  INR 2-3   Plan:  Warfarin 2.5mg  PO x 1 Daily PT / INR Monitor for signs and symptoms of bleeding.   Mady Gemma 03/03/2017,7:02 PM

## 2017-03-03 NOTE — H&P (Signed)
History and Physical    Rebecca [name] MVH:[id number] DOB: 03/24/1940 DOA: 03/03/2017  PCP: [name], MD  Patient coming from: Home  I have personally briefly reviewed patient's old medical records in [facility]  Chief Complaint: Leg pain  HPI: Rebecca [name] is a 77 y.o. female with medical history significant of hypertension, diabetes, chronic kidney disease stage III, history of deep vein thrombosis on Coumadin, presents to the hospital with complaints of leg pain.  She reports erythema, warmth and pain in her left lower extremity since Friday.  She has not felt feverish.  Denies any nausea, vomiting, diarrhea, shortness of breath, chest pain, dysuria.  ED Course: She was evaluated in the emergency room her vitals were noted to be stable.  She had a mild leukocytosis of 12,000.  It was noted that she had significant erythema and cellulitis in her left lower extremity.  Review of Systems: As per HPI otherwise 10 point review of systems negative.    Past Medical History:  Diagnosis Date  . Aortic stenosis   . Cataplexy   . Diastolic heart failure (HCC)   . Essential hypertension   . GERD (gastroesophageal reflux disease)   . Hiatal hernia   . History of recurrent deep vein thrombosis (DVT)   . Mitral stenosis   . Narcolepsy   . Neuropathy   . Type 2 diabetes mellitus (HCC)     Past Surgical History:  Procedure Laterality Date  . ABDOMINAL HYSTERECTOMY    . CARPAL TUNNEL RELEASE    . CARPECTOMY  12/26/2011   Procedure: CARPECTOMY;  Surgeon: Wyn Forster., MD;  Location: [facility];  Service: Orthopedics;  Laterality: Right;  PROXIMAL ROW CARPECTOMY RIGHT WRIST and neurectomy  . CHOLECYSTECTOMY    . HEMORROIDECTOMY    . KIDNEY SURGERY    . NEPHROSTOMY    . PACEMAKER IMPLANT N/A 10/25/2016   Procedure: Pacemaker Implant;  Surgeon: Regan Lemming, MD;  Location: [facility] INVASIVE CV LAB;  Service: Cardiovascular;  Laterality: N/A;  . TOTAL  HIP ARTHROPLASTY     bilateral     reports that she quit smoking about 28 years ago. she has never used smokeless tobacco. She reports that she does not drink alcohol or use drugs.  Allergies  Allergen Reactions  . Codeine Anxiety  . Compazine Anaphylaxis  . Other Anaphylaxis, Rash and Other (See Comments)    Uncoded Allergy. Allergen: INOVAR Uncoded Allergy. Allergen: ALL ADHESIVES Uncoded Allergy. Allergen: SILK SUTURE Uncoded Allergy. Allergen: merthiolate Thermasol  . Penicillins Shortness Of Breath and Rash    Has patient had a PCN reaction causing immediate rash, facial/tongue/throat swelling, SOB or lightheadedness with hypotension: Yes Has patient had a PCN reaction causing severe rash involving mucus membranes or skin necrosis: No Has patient had a PCN reaction that required hospitalization No Has patient had a PCN reaction occurring within the last 10 years: Yes If all of the above answers are "NO", then may proceed with Cephalosporin use.   Marland Kitchen Morphine And Related Other (See Comments)    Patient states that it makes her lose her state of mind.   . Demerol [Meperidine] Rash    Family History  Problem Relation Age of Onset  . Heart disease Father   . Diabetes Unknown   . Stroke Mother   . Scleroderma Brother     Prior to Admission medications   Medication Sig Start Date End Date Taking? Authorizing Provider  BYDUREON 2 MG PEN  Inject 2 mg into the skin once a week. 01/03/16  Yes [provider]  carvedilol (COREG) 3.125 MG tablet Take 1 tablet (3.125 mg total) by mouth 2 (two) times daily with a meal. 10/28/16   Sheilah Pigeon, PA-C  celecoxib (CELEBREX) 200 MG capsule Take 200 mg by mouth daily. For arthritis    [provider]  Cholecalciferol (VITAMIN D3) 3000 UNITS TABS Take 1 capsule by mouth daily.    [provider]  clomiPRAMINE (ANAFRANIL) 25 MG capsule Take 25 mg by mouth 4 (four) times daily.     [provider]    clonazePAM (KLONOPIN) 1 MG tablet Take 1 mg by mouth at bedtime.     [provider]  Coenzyme Q10 (CO Q10) 200 MG CAPS Take 2 capsules by mouth daily.     [provider]  DULoxetine (CYMBALTA) 60 MG capsule Take 60 mg by mouth daily.      [provider]  Ferrous Fumarate (FERROCITE) 324 (106 FE) MG TABS Take 1 capsule by mouth daily.     [provider]  furosemide (LASIX) 40 MG tablet Take 20 mg by mouth 2 (two) times daily.  12/01/14   [provider]  Hyaluronic Acid-Vitamin C (HYALURONIC ACID PO) Take 100 mg by mouth daily.    [provider]  insulin glargine (LANTUS) 100 unit/mL SOPN Inject 24 Units into the skin at bedtime.     [provider]  magnesium oxide (MAG-OX) 400 MG tablet Take 400 mg by mouth daily.    [provider]  metFORMIN (GLUCOPHAGE) 500 MG tablet Take 500 mg by mouth 2 (two) times daily with a meal.      [provider]  mirtazapine (REMERON) 15 MG tablet Take 15 mg by mouth at bedtime.      [provider]  potassium chloride (K-DUR) 10 MEQ tablet Take 1 tablet (10 mEq total) by mouth daily. 09/21/14   Dyann Kief, PA-C  Probiotic Product (PROBIOTIC ADVANCED PO) Take 1 capsule by mouth daily.    [provider]  QUEtiapine (SEROQUEL) 100 MG tablet Take 100 mg by mouth at bedtime.    [provider]  warfarin (COUMADIN) 5 MG tablet Take 5 mg by mouth every evening. Starting 10/29/2016 take 2.5 mg alternating with 5 mg daily     [provider]    Physical Exam: Vitals:   03/03/17 1500 03/03/17 1515 03/03/17 1530 03/03/17 1639  BP: (!) 110/30  119/62 (!) 120/54  Pulse: 74 78  (!) 118  Resp: (!) 31 17 (!) 25 (!) 26  Temp:    98 F (36.7 C)  TempSrc:    Oral  SpO2: 93% 90%  90%  Weight:    102.2 kg (225 lb 5 oz)  Height:    5\' 8"  (1.727 m)    Constitutional: NAD, calm, comfortable Vitals:   03/03/17 1500 03/03/17 1515 03/03/17 1530  03/03/17 1639  BP: (!) 110/30  119/62 (!) 120/54  Pulse: 74 78  (!) 118  Resp: (!) 31 17 (!) 25 (!) 26  Temp:    98 F (36.7 C)  TempSrc:    Oral  SpO2: 93% 90%  90%  Weight:    102.2 kg (225 lb 5 oz)  Height:    5\' 8"  (1.727 m)   Eyes: PERRL, lids and conjunctivae normal ENMT: Mucous membranes are moist. Posterior pharynx clear of any exudate or lesions.Normal dentition.  Neck: normal, supple, no  masses, no thyromegaly Respiratory: clear to auscultation bilaterally, no wheezing, no crackles. Normal respiratory effort. No accessory muscle use.  Cardiovascular: Regular rate and rhythm, no murmurs / rubs / gallops. No extremity edema. 2+ pedal pulses. No carotid bruits.  Abdomen: no tenderness, no masses palpated. No hepatosplenomegaly. Bowel sounds positive.  Musculoskeletal: no clubbing / cyanosis. No joint deformity upper and lower extremities. Good ROM, no contractures. Normal muscle tone.  Skin: Erythema and darkening skin noted over left lower leg, spanning from ankle to above the knee.  This is warm and tender to touch.  Leg is edematous. Neurologic: CN 2-12 grossly intact. Sensation intact, DTR normal. Strength 5/5 in all 4.  Psychiatric: Normal judgment and insight. Alert and oriented x 3. Normal mood.     Labs on Admission: I have personally reviewed following labs and imaging studies  CBC: Recent Labs  Lab 03/03/17 1040  WBC 12.3*  NEUTROABS 9.4*  HGB 13.3  HCT 40.8  MCV 88.9  PLT 250   Basic Metabolic Panel: Recent Labs  Lab 03/03/17 1040  NA 134*  K 4.1  CL 98*  CO2 23  GLUCOSE 245*  BUN 26*  CREATININE 1.18*  CALCIUM 9.1   GFR: Estimated Creatinine Clearance: 49.9 mL/min (A) (by C-G formula based on SCr of 1.18 mg/dL (H)). Liver Function Tests: Recent Labs  Lab 03/03/17 1040  AST 20  ALT 16  ALKPHOS 67  BILITOT 0.7  PROT 7.6  ALBUMIN 3.4*   No results for input(s): LIPASE, AMYLASE in the last 168 hours. No results for input(s): AMMONIA in  the last 168 hours. Coagulation Profile: Recent Labs  Lab 03/03/17 1040  INR 2.28   Cardiac Enzymes: No results for input(s): CKTOTAL, CKMB, CKMBINDEX, TROPONINI in the last 168 hours. BNP (last 3 results) No results for input(s): PROBNP in the last 8760 hours. HbA1C: No results for input(s): HGBA1C in the last 72 hours. CBG: No results for input(s): GLUCAP in the last 168 hours. Lipid Profile: No results for input(s): CHOL, HDL, LDLCALC, TRIG, CHOLHDL, LDLDIRECT in the last 72 hours. Thyroid Function Tests: No results for input(s): TSH, T4TOTAL, FREET4, T3FREE, THYROIDAB in the last 72 hours. Anemia Panel: No results for input(s): VITAMINB12, FOLATE, FERRITIN, TIBC, IRON, RETICCTPCT in the last 72 hours. Urine analysis:    Component Value Date/Time   COLORURINE AMBER (A) 10/23/2016 0113   APPEARANCEUR CLOUDY (A) 10/23/2016 0113   LABSPEC 1.015 10/23/2016 0113   PHURINE 5.0 10/23/2016 0113   GLUCOSEU NEGATIVE 10/23/2016 0113   HGBUR NEGATIVE 10/23/2016 0113   BILIRUBINUR NEGATIVE 10/23/2016 0113   KETONESUR NEGATIVE 10/23/2016 0113   PROTEINUR 100 (A) 10/23/2016 0113   UROBILINOGEN 0.2 09/05/2014 2015   NITRITE NEGATIVE 10/23/2016 0113   LEUKOCYTESUR NEGATIVE 10/23/2016 0113    Radiological Exams on Admission: US Venous Img Lower  Left (dvt Study)  Result Date: 03/03/2017 CLINICAL DATA:  Pain and edema x4 days; remote history of DVT, on warfarin EXAM: LEFT LOWER EXTREMITY VENOUS DOPPLER ULTRASOUND TECHNIQUE: Gray-scale sonography with compression, as well as color and duplex ultrasound, were performed to evaluate the deep venous system from the level of the common femoral vein through the popliteal and proximal calf veins. COMPARISON:  None FINDINGS: Normal compressibility of the common femoral, superficial femoral, and popliteal veins, as well as the proximal calf veins. No filling defects to suggest DVT on grayscale or color Doppler imaging. Doppler waveforms show normal  direction of venous flow, normal respiratory phasicity and response to augmentation. Visualized  segments of the saphenous venous system normal in caliber and compressibility. Subcutaneous edema in the left calf. Survey views of the contralateral common femoral vein are unremarkable. IMPRESSION: No evidence of LEFT lower extremity deep vein thrombosis. Electronically Signed   By: Corlis Leak M.D.   On: 03/03/2017 12:28   Dg Foot 2 Views Left  Result Date: 03/03/2017 CLINICAL DATA:  Left foot pain.  Redness and swelling in the foot. EXAM: LEFT FOOT - 2 VIEW COMPARISON:  None. FINDINGS: Soft tissue swelling is present throughout the foot. Bone mineralization is within normal limits. No acute osseous abnormality is present. Vascular calcifications are noted. IMPRESSION: 1. Sub diffuse soft tissue swelling without underlying osseous abnormality. This may be related to cellulitis or edema. 2. Vascular calcifications compatible with atherosclerosis. Distal small vessel calcifications are often associated with diabetes. Electronically Signed   By: Marin Roberts M.D.   On: 03/03/2017 12:09    EKG: Independently reviewed.  Paced rhythm  Assessment/Plan Active Problems:   Essential hypertension   Chronic diastolic CHF (congestive heart failure) (HCC)   Chronic anticoagulation-Coumadin   Cellulitis   Type 2 diabetes mellitus with other specified complication (HCC)   CKD (chronic kidney disease), stage III (HCC)    1. Cellulitis of left lower leg.  Patient has been started on intravenous vancomycin.  Venous Dopplers negative for DVT.  Keep lower extremity elevated. 2. Chronic kidney disease stage III.  Creatinine is currently at baseline.  Continue to monitor 3. History of DVT on chronic Coumadin.  Continue Coumadin per pharmacy. 4. Diabetes.  Continue on Lantus.  Supplement with sliding scale insulin. 5. Hypertension.  Stable.  Continue outpatient regimen.  DVT prophylaxis: Coumadin Code Status:  Full code Family Communication: discussed with family at the bedside Disposition Plan: discharge home once improved Consults called:  Admission status: inpatient, medsurg   Erick Blinks MD [facility] Pager [phone]  If 7PM-7AM, please contact night-coverage [email or web address] Password [email or web address]  03/03/2017, 6:34 PM

## 2017-03-03 NOTE — Progress Notes (Signed)
Pharmacy Antibiotic Note  Rebecca [name] is a 77 y.o. female admitted on 03/03/2017 with cellulitis.  Pharmacy has been consulted for Vancomycin dosing.  Plan: Additional 1gm Vancomycin ordered on admission, which will complete 2gm load. Vancomycin 1250 IV every 24 hours.  Goal trough 10-15 mcg/mL.  Monitor labs, c/s, and vanco trough as needed  Height: 5\' 8"  (172.7 cm) Weight: 225 lb 5 oz (102.2 kg) IBW/kg (Calculated) : 63.9  Temp (24hrs), Avg:98.2 F (36.8 C), Min:98 F (36.7 C), Max:98.3 F (36.8 C)  Recent Labs  Lab 03/03/17 1040 03/03/17 1241  WBC 12.3*  --   CREATININE 1.18*  --   LATICACIDVEN  --  1.08    Estimated Creatinine Clearance: 49.9 mL/min (A) (by C-G formula based on SCr of 1.18 mg/dL (H)).    Allergies  Allergen Reactions  . Codeine Anxiety  . Compazine Anaphylaxis  . Other Anaphylaxis, Rash and Other (See Comments)    Uncoded Allergy. Allergen: INOVAR Uncoded Allergy. Allergen: ALL ADHESIVES Uncoded Allergy. Allergen: SILK SUTURE Uncoded Allergy. Allergen: merthiolate Thermasol  . Penicillins Shortness Of Breath and Rash    Has patient had a PCN reaction causing immediate rash, facial/tongue/throat swelling, SOB or lightheadedness with hypotension: Yes Has patient had a PCN reaction causing severe rash involving mucus membranes or skin necrosis: No Has patient had a PCN reaction that required hospitalization No Has patient had a PCN reaction occurring within the last 10 years: Yes If all of the above answers are "NO", then may proceed with Cephalosporin use.   Marland Kitchen Morphine And Related Other (See Comments)    Patient states that it makes her lose her state of mind.   . Demerol [Meperidine] Rash   Antimicrobials this admission: 1/28 Vanco >>     Dose adjustments this admission: Obesity/Normalized CrCl Vancomycin dosing protocol will be initiated with an estimated normalized CrCl = 45 ml/min.   Microbiology results: 1/28 BCx: pending  Thank you  for allowing pharmacy to be a part of this patient's care.  Mady Gemma, [facility] 03/03/2017 6:46 PM

## 2017-03-03 NOTE — ED Provider Notes (Signed)
Emergency Department Provider Note   I have reviewed the triage vital signs and the nursing notes.   HISTORY  Chief Complaint Leg Pain   HPI Rebecca [name] is a 77 y.o. female with PMH of DM, dCHF, HTN, and DVT on Coumadin presents to the emergency department for evaluation of left leg pain.  Symptoms began 2 days ago.  She has a callus on the back of the left heel which came off and symptoms in the leg began shortly afterwards.  She has had some subjective fevers at home.  Denies nausea or vomiting.  No diarrhea.  She has been compliant with her Coumadin and has a remote history of DVT in the left lower extremity.  She last had imaging of the blood clot 3 years prior.  Patient has severe pain in the leg denies any modifying factors or radiation of symptoms.   Past Medical History:  Diagnosis Date  . Aortic stenosis   . Cataplexy   . Diastolic heart failure (HCC)   . Essential hypertension   . GERD (gastroesophageal reflux disease)   . Hiatal hernia   . History of recurrent deep vein thrombosis (DVT)   . Mitral stenosis   . Narcolepsy   . Neuropathy   . Type 2 diabetes mellitus [facility])     Patient Active Problem List   Diagnosis Date Noted  . Status post placement of cardiac pacemaker 10/29/2016  . Pulmonary edema 10/23/2016  . CAP (community acquired pneumonia) 02/22/2015  . Fall at home 09/21/2014  . Mild aortic stenosis 09/08/2014  . Mild to moderate  mitral stenosis 09/08/2014  . Chronic anticoagulation-Coumadin 09/08/2014  . Sepsis (HCC) 09/05/2014  . Lower leg DVT (deep venous thromboembolism), chronic (HCC) 09/05/2014  . Essential hypertension 09/05/2014  . Depression 09/05/2014  . Poorly controlled diabetes mellitus (HCC) 09/05/2014  . Hypomagnesemia 09/05/2014  . Hyponatremia 09/05/2014  . Pyelonephritis 09/05/2014  . Chronic diastolic CHF (congestive heart failure) (HCC)   . 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 GERD  (gastroesophageal reflux disease) 07/08/2011  . Gastroparesis 07/08/2011  . IDA (iron deficiency anemia) 07/08/2011  . History of colonic polyps 07/08/2011  . Narcolepsy-evaluated at [facility] 07/08/2011  . NAFLD (nonalcoholic fatty liver disease) 16/11/9602  . Obesity-BMI 37 07/08/2011    Past Surgical History:  Procedure Laterality Date  . ABDOMINAL HYSTERECTOMY    . CARPAL TUNNEL RELEASE    . CARPECTOMY  12/26/2011   Procedure: CARPECTOMY;  Surgeon: Wyn Forster., MD;  Location: [facility];  Service: Orthopedics;  Laterality: Right;  PROXIMAL ROW CARPECTOMY RIGHT WRIST and neurectomy  . CHOLECYSTECTOMY    . HEMORROIDECTOMY    . KIDNEY SURGERY    . NEPHROSTOMY    . PACEMAKER IMPLANT N/A 10/25/2016   Procedure: Pacemaker Implant;  Surgeon: Regan Lemming, MD;  Location[facility] INVASIVE CV LAB;  Service: Cardiovascular;  Laterality: N/A;  . TOTAL HIP ARTHROPLASTY     bilateral    Current Outpatient Rx  . Order #: [id number] Class: Historical Med  . Order #: [id number] Class: Normal  . Order #: [id number] Class: Historical Med  . Order #: [id number] Class: Historical Med  . Order #: [id number] Class: Historical Med  . Order #: [id number] Class: Historical Med  . Order #: [id number] Class: Historical Med  . Order #: [id number] Class: Historical Med  . Order #: [id number] Class: Historical Med  . Order #: [id number] Class: Historical Med  . Order #: [id number] Class: Historical Med  . Order #: [id number] Class: Historical Med  .  Order #: [id number] Class: Historical Med  . Order #: [id number] Class: Historical Med  . Order #: [id number] Class: Historical Med  . Order #: [id number] Class: No Print  . Order #: [id number] Class: Historical Med  . Order #: [id number] Class: Historical Med  . Order #: [id number] Class: Historical Med    Allergies Codeine; Compazine; Other; Penicillins; Morphine and related; and Demerol [meperidine]  Family History  Problem Relation Age of Onset  . Heart disease  Father   . Diabetes Unknown   . Stroke Mother   . Scleroderma Brother     Social History Social History   Tobacco Use  . Smoking status: Former Smoker    Last attempt to quit: 10/13/1988    Years since quitting: 28.4  . Smokeless tobacco: Never Used  Substance Use Topics  . Alcohol use: No    Alcohol/week: 0.0 oz  . Drug use: No    Review of Systems  Constitutional: Positive fever/chills Eyes: No visual changes. ENT: No sore throat. Cardiovascular: Denies chest pain.  Respiratory: Denies shortness of breath. Gastrointestinal: No abdominal pain. No nausea, no vomiting.  No diarrhea.  No constipation. Genitourinary: Negative for dysuria. Musculoskeletal: Negative for back pain. Positive severe right knee pain.  Skin: Positive left leg swelling, pain, and redness.  Neurological: Negative for headaches, focal weakness or numbness.  10-point ROS otherwise negative.  ____________________________________________   PHYSICAL EXAM:  VITAL SIGNS: ED Triage Vitals  Enc Vitals Group     BP 03/03/17 1004 (!) 147/75     Pulse Rate 03/03/17 1007 85     Resp 03/03/17 1007 18     Temp 03/03/17 1007 98.3 F (36.8 C)     Temp Source 03/03/17 1007 Oral     SpO2 03/03/17 1007 97 %     Weight 03/03/17 1008 210 lb (95.3 kg)     Height 03/03/17 1008 5\' 8"  (1.727 m)     Pain Score 03/03/17 1008 10   Constitutional: Alert and oriented. Well appearing and in no acute distress. Eyes: Conjunctivae are normal. Head: Atraumatic. Nose: No congestion/rhinnorhea. Mouth/Throat: Mucous membranes are moist.  Oropharynx non-erythematous. Neck: No stridor.   Cardiovascular: Normal rate, regular rhythm. Good peripheral circulation. Grossly normal heart sounds.   Respiratory: Normal respiratory effort.  No retractions. Lungs CTAB. Gastrointestinal: Soft and nontender. No distention.  Musculoskeletal: No lower extremity tenderness nor edema. No gross deformities of extremities. Neurologic:  Normal  speech and language. No gross focal neurologic deficits are appreciated.  Skin: Left leg is warm, erythematous, and tender to touch with area of ulceration over the left heal. Erythema extends from the ankle posteriorly to the anterior lower leg and above the left knee. No crepitus.   ____________________________________________   LABS (all labs ordered are listed, but only abnormal results are displayed)  Labs Reviewed  COMPREHENSIVE METABOLIC PANEL - Abnormal; Notable for the following components:      Result Value   Sodium 134 (*)    Chloride 98 (*)    Glucose, Bld 245 (*)    BUN 26 (*)    Creatinine, Ser 1.18 (*)    Albumin 3.4 (*)    GFR calc non Af Amer 43 (*)    GFR calc Af Amer 50 (*)    All other components within normal limits  CBC WITH DIFFERENTIAL/PLATELET - Abnormal; Notable for the following components:   WBC 12.3 (*)    Neutro Abs 9.4 (*)    All other components within normal limits  PROTIME-INR - Abnormal; Notable for  the following components:   Prothrombin Time 25.0 (*)    All other components within normal limits  CULTURE, BLOOD (ROUTINE X 2)  CULTURE, BLOOD (ROUTINE X 2)  I-STAT CG4 LACTIC ACID, ED   ____________________________________________  EKG   EKG Interpretation  Date/Time:  Monday March 03 2017 10:06:27 EST Ventricular Rate:  91 PR Interval:    QRS Duration: 142 QT Interval:  428 QTC Calculation: 527 R Axis:   -54 Text Interpretation:  Ventricular-paced rhythm No further analysis attempted due to paced rhythm Baseline wander in lead(s) I II aVR aVF V2 V6 Confirmed by Alona Bene [phone]) on 03/03/2017 10:26:42 AM       ____________________________________________  RADIOLOGY  US Venous Img Lower  Left (dvt Study)  Result Date: 03/03/2017 CLINICAL DATA:  Pain and edema x4 days; remote history of DVT, on warfarin EXAM: LEFT LOWER EXTREMITY VENOUS DOPPLER ULTRASOUND TECHNIQUE: Gray-scale sonography with compression, as well as color and  duplex ultrasound, were performed to evaluate the deep venous system from the level of the common femoral vein through the popliteal and proximal calf veins. COMPARISON:  None FINDINGS: Normal compressibility of the common femoral, superficial femoral, and popliteal veins, as well as the proximal calf veins. No filling defects to suggest DVT on grayscale or color Doppler imaging. Doppler waveforms show normal direction of venous flow, normal respiratory phasicity and response to augmentation. Visualized segments of the saphenous venous system normal in caliber and compressibility. Subcutaneous edema in the left calf. Survey views of the contralateral common femoral vein are unremarkable. IMPRESSION: No evidence of LEFT lower extremity deep vein thrombosis. Electronically Signed   By: Corlis Leak M.D.   On: 03/03/2017 12:28   Dg Foot 2 Views Left  Result Date: 03/03/2017 CLINICAL DATA:  Left foot pain.  Redness and swelling in the foot. EXAM: LEFT FOOT - 2 VIEW COMPARISON:  None. FINDINGS: Soft tissue swelling is present throughout the foot. Bone mineralization is within normal limits. No acute osseous abnormality is present. Vascular calcifications are noted. IMPRESSION: 1. Sub diffuse soft tissue swelling without underlying osseous abnormality. This may be related to cellulitis or edema. 2. Vascular calcifications compatible with atherosclerosis. Distal small vessel calcifications are often associated with diabetes. Electronically Signed   By: Marin Roberts M.D.   On: 03/03/2017 12:09    ____________________________________________   PROCEDURES  Procedure(s) performed:   Procedures  None ____________________________________________   INITIAL IMPRESSION / ASSESSMENT AND PLAN / ED COURSE  Pertinent labs & imaging results that were available during my care of the patient were reviewed by me and considered in my medical decision making (see chart for details).  Large surface area, diffuse  erythema that is warm and very tender to touch.  Concerning for acute cellulitis.  She has had subjective fever symptoms at home.  She has multiple medical comorbidities including diabetes.  She has an ulcer on the heel of her foot plan x-ray to rule out osteomyelitis.  Also plan to repeat ultrasound of the left lower extremity with last imaging done 3 years prior.  INR pending.   12:40 PM Labs reviewed with leukocytosis and INR that is therapeutic.  Ultrasound shows no evidence of DVT in the left lower extremity.  Plain film shows no evidence of osteomyelitis.  Started the patient on vancomycin.  Given her medical comorbidities and extensive cellulitis plan for inpatient treatment to ensure symptoms improve with antibiotic therapy.  Discussed patient's case with Hospitalist, Dr. Kerry Hough to request admission. Patient and family (  if present) updated with plan. Care transferred to Hospitalist service.  I reviewed all nursing notes, vitals, pertinent old records, EKGs, labs, imaging (as available).  ____________________________________________  FINAL CLINICAL IMPRESSION(S) / ED DIAGNOSES  Final diagnoses:  Left leg pain  Cellulitis of left lower extremity     MEDICATIONS GIVEN DURING THIS VISIT:  Medications  vancomycin (VANCOCIN) 1,500 mg in sodium chloride 0.9 % 500 mL IVPB (not administered)  vancomycin (VANCOCIN) IVPB 1000 mg/200 mL premix (0 mg Intravenous Stopped 03/03/17 1411)    Note:  This document was prepared using Dragon voice recognition software and may include unintentional dictation errors.  Alona Bene, MD Emergency Medicine    Long, Arlyss Repress, MD 03/03/17 [phone]

## 2017-03-03 NOTE — ED Triage Notes (Signed)
Pt c/o of left leg pain.  Went to PCP today for leg. PCP states she need to come to ED.  Pt states she has blood clot in left leg as well. Pedal pulse present.

## 2017-03-03 NOTE — ED Notes (Signed)
Patient transported to Ultrasound 

## 2017-03-04 LAB — BASIC METABOLIC PANEL
Anion gap: 12 (ref 5–15)
BUN: 23 mg/dL — AB (ref 6–20)
CO2: 24 mmol/L (ref 22–32)
CREATININE: 1.06 mg/dL — AB (ref 0.44–1.00)
Calcium: 8.8 mg/dL — ABNORMAL LOW (ref 8.9–10.3)
Chloride: 97 mmol/L — ABNORMAL LOW (ref 101–111)
GFR calc Af Amer: 57 mL/min — ABNORMAL LOW (ref >60)
GFR calc non Af Amer: 49 mL/min — ABNORMAL LOW (ref >60)
GLUCOSE: 190 mg/dL — AB (ref 65–99)
Potassium: 4.1 mmol/L (ref 3.5–5.1)
Sodium: 133 mmol/L — ABNORMAL LOW (ref 135–145)

## 2017-03-04 LAB — CBC
HCT: 37.1 % (ref 36.0–46.0)
Hemoglobin: 12 g/dL (ref 12.0–15.0)
MCH: 28.8 pg (ref 26.0–34.0)
MCHC: 32.3 g/dL (ref 30.0–36.0)
MCV: 89 fL (ref 78.0–100.0)
PLATELETS: 251 10*3/uL (ref 150–400)
RBC: 4.17 MIL/uL (ref 3.87–5.11)
RDW: 14.2 % (ref 11.5–15.5)
WBC: 14.8 10*3/uL — ABNORMAL HIGH (ref 4.0–10.5)

## 2017-03-04 LAB — PROTIME-INR
INR: 2.37
Prothrombin Time: 25.7 seconds — ABNORMAL HIGH (ref 11.4–15.2)

## 2017-03-04 LAB — GLUCOSE, CAPILLARY
GLUCOSE-CAPILLARY: 159 mg/dL — AB (ref 65–99)
GLUCOSE-CAPILLARY: 160 mg/dL — AB (ref 65–99)
Glucose-Capillary: 149 mg/dL — ABNORMAL HIGH (ref 65–99)

## 2017-03-04 MED ORDER — FAMOTIDINE 20 MG PO TABS
20.0000 mg | ORAL_TABLET | Freq: Two times a day (BID) | ORAL | Status: DC
  Administered 2017-03-04 – 2017-03-12 (×17): 20 mg via ORAL
  Filled 2017-03-04 (×17): qty 1

## 2017-03-04 MED ORDER — KETOROLAC TROMETHAMINE 15 MG/ML IJ SOLN
15.0000 mg | Freq: Once | INTRAMUSCULAR | Status: AC
  Administered 2017-03-04: 15 mg via INTRAVENOUS
  Filled 2017-03-04: qty 1

## 2017-03-04 MED ORDER — SODIUM CHLORIDE 0.9% FLUSH
INTRAVENOUS | Status: AC
  Administered 2017-03-04: 10 mL
  Filled 2017-03-04: qty 10

## 2017-03-04 MED ORDER — WARFARIN SODIUM 5 MG PO TABS
5.0000 mg | ORAL_TABLET | Freq: Once | ORAL | Status: AC
  Administered 2017-03-04: 5 mg via ORAL
  Filled 2017-03-04: qty 1

## 2017-03-04 NOTE — Progress Notes (Addendum)
Craige Cotta, NP notified of patient's red rash to right arm, face (mostly left side), and left chest. Patient denies symptoms. VS per flow sheet. No swelling, open areas, blisters, or drainage noted to rash areas. No new orders. Will continue to monitor. Will report to oncoming RN in AM.

## 2017-03-04 NOTE — Progress Notes (Signed)
ANTICOAGULATION CONSULT NOTE - Follow up Consult  Pharmacy Consult for Warfarin Indication: Hx DVT  Allergies  Allergen Reactions  . Codeine Anxiety  . Compazine Anaphylaxis  . Other Anaphylaxis, Rash and Other (See Comments)    Uncoded Allergy. Allergen: INOVAR Uncoded Allergy. Allergen: ALL ADHESIVES Uncoded Allergy. Allergen: SILK SUTURE Uncoded Allergy. Allergen: merthiolate Thermasol  . Penicillins Shortness Of Breath and Rash    Has patient had a PCN reaction causing immediate rash, facial/tongue/throat swelling, SOB or lightheadedness with hypotension: Yes Has patient had a PCN reaction causing severe rash involving mucus membranes or skin necrosis: No Has patient had a PCN reaction that required hospitalization No Has patient had a PCN reaction occurring within the last 10 years: Yes If all of the above answers are "NO", then may proceed with Cephalosporin use.   Marland Kitchen Morphine And Related Other (See Comments)    Patient states that it makes her lose her state of mind.   . Demerol [Meperidine] Rash    Patient Measurements: Height: 5\' 8"  (172.7 cm) Weight: 225 lb 5 oz (102.2 kg) IBW/kg (Calculated) : 63.9  Vital Signs: Temp: 98.3 F (36.8 C) (01/29 0625) Temp Source: Oral (01/29 0625) BP: 122/57 (01/29 0625) Pulse Rate: 68 (01/29 0625)  Labs: Recent Labs    03/03/17 1040 03/04/17 0430  HGB 13.3 12.0  HCT 40.8 37.1  PLT 250 251  LABPROT 25.0* 25.7*  INR 2.28 2.37  CREATININE 1.18* 1.06*    Estimated Creatinine Clearance: 55.6 mL/min (A) (by C-G formula based on SCr of 1.06 mg/dL (H)).   Medical History: Past Medical History:  Diagnosis Date  . Aortic stenosis   . Cataplexy   . Diastolic heart failure (HCC)   . Essential hypertension   . GERD (gastroesophageal reflux disease)   . Hiatal hernia   . History of recurrent deep vein thrombosis (DVT)   . Mitral stenosis   . Narcolepsy   . Neuropathy   . Type 2 diabetes mellitus (HCC)     Medications:   Medications Prior to Admission  Medication Sig Dispense Refill Last Dose  . BYDUREON 2 MG PEN Inject 2 mg into the skin once a week.  5 Past Week at Unknown time  . carvedilol (COREG) 3.125 MG tablet Take 1 tablet (3.125 mg total) by mouth 2 (two) times daily with a meal. 60 tablet 6 Taking  . celecoxib (CELEBREX) 200 MG capsule Take 200 mg by mouth daily. For arthritis   Taking  . Cholecalciferol (VITAMIN D3) 3000 UNITS TABS Take 1 capsule by mouth daily.   Taking  . clomiPRAMINE (ANAFRANIL) 25 MG capsule Take 25 mg by mouth 4 (four) times daily.    Taking  . clonazePAM (KLONOPIN) 1 MG tablet Take 1 mg by mouth at bedtime.    Taking  . Coenzyme Q10 (CO Q10) 200 MG CAPS Take 2 capsules by mouth daily.    Taking  . DULoxetine (CYMBALTA) 60 MG capsule Take 60 mg by mouth daily.     Taking  . Ferrous Fumarate (FERROCITE) 324 (106 FE) MG TABS Take 1 capsule by mouth daily.    Taking  . furosemide (LASIX) 40 MG tablet Take 20 mg by mouth 2 (two) times daily.   5 Taking  . Hyaluronic Acid-Vitamin C (HYALURONIC ACID PO) Take 100 mg by mouth daily.   Taking  . insulin glargine (LANTUS) 100 unit/mL SOPN Inject 24 Units into the skin at bedtime.    Taking  . magnesium oxide (MAG-OX) 400  MG tablet Take 400 mg by mouth daily.   Taking  . metFORMIN (GLUCOPHAGE) 500 MG tablet Take 500 mg by mouth 2 (two) times daily with a meal.     Taking  . mirtazapine (REMERON) 15 MG tablet Take 15 mg by mouth at bedtime.     Taking  . potassium chloride (K-DUR) 10 MEQ tablet Take 1 tablet (10 mEq total) by mouth daily. 90 tablet 3 Taking  . Probiotic Product (PROBIOTIC ADVANCED PO) Take 1 capsule by mouth daily.   Taking  . QUEtiapine (SEROQUEL) 100 MG tablet Take 100 mg by mouth at bedtime.   Taking  . warfarin (COUMADIN) 5 MG tablet Take 5 mg by mouth every evening. Starting 10/29/2016 take 2.5 mg alternating with 5 mg daily    Taking   Assessment: Okay for Protocol, INR at goal. Med rec has patient taking 2.5mg   alternating with 5mg  daily. INR remains therapeutic today  Goal of Therapy:  INR 2-3   Plan:  Warfarin 5mg  PO x 1 Daily PT / INR Monitor for signs and symptoms of bleeding.   Elder Cyphers, BS Loura Back, BCPS Clinical Pharmacist Pager [phone] 03/04/2017,8:53 AM

## 2017-03-04 NOTE — Progress Notes (Signed)
Patient started on vancomycin for left lower extremity cellular venous Dopplers negative for DVT Rebecca [name] NFA:[id number] DOB: 1940/09/28 DOA: 03/03/2017 PCP: [name], MD   Physical Exam: Blood pressure (!) 122/57, pulse 68, temperature 98.3 F (36.8 C), temperature source Oral, resp. rate 18, height 5\' 8"  (1.727 m), weight 102.2 kg (225 lb 5 oz), SpO2 98 %.  Lungs diminished breath sounds at bases no rales wheeze or rhonchi appreciable heart regular regular no S3 no S4 no heaves thrills or rubs.  Left lower extremity erythematous tender to touch   Investigations:  Recent Results (from the past 240 hour(s))  Culture, blood (routine x 2)     Status: None (Preliminary result)   Collection Time: 03/03/17 11:00 AM  Result Value Ref Range Status   Specimen Description RIGHT ANTECUBITAL  Final   Special Requests   Final    BOTTLES DRAWN AEROBIC AND ANAEROBIC Blood Culture adequate volume   Culture NO GROWTH < 24 HOURS  Final   Report Status PENDING  Incomplete  Culture, blood (routine x 2)     Status: None (Preliminary result)   Collection Time: 03/03/17 11:06 AM  Result Value Ref Range Status   Specimen Description LEFT ANTECUBITAL  Final   Special Requests   Final    BOTTLES DRAWN AEROBIC AND ANAEROBIC Blood Culture adequate volume   Culture NO GROWTH < 24 HOURS  Final   Report Status PENDING  Incomplete     Basic Metabolic Panel: Recent Labs    03/03/17 1040 03/04/17 0430  NA 134* 133*  K 4.1 4.1  CL 98* 97*  CO2 23 24  GLUCOSE 245* 190*  BUN 26* 23*  CREATININE 1.18* 1.06*  CALCIUM 9.1 8.8*   Liver Function Tests: Recent Labs    03/03/17 1040  AST 20  ALT 16  ALKPHOS 67  BILITOT 0.7  PROT 7.6  ALBUMIN 3.4*     CBC: Recent Labs    03/03/17 1040 03/04/17 0430  WBC 12.3* 14.8*  NEUTROABS 9.4*  --   HGB 13.3 12.0  HCT 40.8 37.1  MCV 88.9 89.0  PLT 250 251    US Venous Img Lower  Left (dvt Study)  Result Date: 03/03/2017 CLINICAL DATA:   Pain and edema x4 days; remote history of DVT, on warfarin EXAM: LEFT LOWER EXTREMITY VENOUS DOPPLER ULTRASOUND TECHNIQUE: Gray-scale sonography with compression, as well as color and duplex ultrasound, were performed to evaluate the deep venous system from the level of the common femoral vein through the popliteal and proximal calf veins. COMPARISON:  None FINDINGS: Normal compressibility of the common femoral, superficial femoral, and popliteal veins, as well as the proximal calf veins. No filling defects to suggest DVT on grayscale or color Doppler imaging. Doppler waveforms show normal direction of venous flow, normal respiratory phasicity and response to augmentation. Visualized segments of the saphenous venous system normal in caliber and compressibility. Subcutaneous edema in the left calf. Survey views of the contralateral common femoral vein are unremarkable. IMPRESSION: No evidence of LEFT lower extremity deep vein thrombosis. Electronically Signed   By: Corlis Leak M.D.   On: 03/03/2017 12:28   Dg Foot 2 Views Left  Result Date: 03/03/2017 CLINICAL DATA:  Left foot pain.  Redness and swelling in the foot. EXAM: LEFT FOOT - 2 VIEW COMPARISON:  None. FINDINGS: Soft tissue swelling is present throughout the foot. Bone mineralization is within normal limits. No acute osseous abnormality is present. Vascular calcifications are noted. IMPRESSION: 1. Sub  diffuse soft tissue swelling without underlying osseous abnormality. This may be related to cellulitis or edema. 2. Vascular calcifications compatible with atherosclerosis. Distal small vessel calcifications are often associated with diabetes. Electronically Signed   By: Marin Roberts M.D.   On: 03/03/2017 12:09      Medication  Impression:  Active Problems:   Essential hypertension   Chronic diastolic CHF (congestive heart failure) (HCC)   Chronic anticoagulation-Coumadin   Cellulitis   Type 2 diabetes mellitus with other specified  complication (HCC)   CKD (chronic kidney disease), stage III (HCC)     Plan: Continue Coumadin and vancomycin as per pharmacy protocol monitor renal function and electrolytes monitor glycemic control  Consultants: Pharmacy   Procedures venous Doppler was performed   Antibiotics: Vancomycin          Time spent:   LOS: 1 day   Kaleel Schmieder M   03/04/2017, 1:39 PM

## 2017-03-05 LAB — BASIC METABOLIC PANEL
Anion gap: 12 (ref 5–15)
BUN: 23 mg/dL — AB (ref 6–20)
CALCIUM: 8.5 mg/dL — AB (ref 8.9–10.3)
CO2: 22 mmol/L (ref 22–32)
Chloride: 100 mmol/L — ABNORMAL LOW (ref 101–111)
Creatinine, Ser: 1.04 mg/dL — ABNORMAL HIGH (ref 0.44–1.00)
GFR calc Af Amer: 59 mL/min — ABNORMAL LOW (ref >60)
GFR, EST NON AFRICAN AMERICAN: 50 mL/min — AB (ref >60)
Glucose, Bld: 139 mg/dL — ABNORMAL HIGH (ref 65–99)
Potassium: 4 mmol/L (ref 3.5–5.1)
SODIUM: 134 mmol/L — AB (ref 135–145)

## 2017-03-05 LAB — GLUCOSE, CAPILLARY
GLUCOSE-CAPILLARY: 135 mg/dL — AB (ref 65–99)
Glucose-Capillary: 145 mg/dL — ABNORMAL HIGH (ref 65–99)
Glucose-Capillary: 109 mg/dL — ABNORMAL HIGH (ref 65–99)
Glucose-Capillary: 321 mg/dL — ABNORMAL HIGH (ref 65–99)

## 2017-03-05 LAB — PROTIME-INR
INR: 2.7
Prothrombin Time: 28.5 seconds — ABNORMAL HIGH (ref 11.4–15.2)

## 2017-03-05 MED ORDER — KETOROLAC TROMETHAMINE 15 MG/ML IJ SOLN
15.0000 mg | Freq: Three times a day (TID) | INTRAMUSCULAR | Status: AC | PRN
  Administered 2017-03-05 – 2017-03-09 (×3): 15 mg via INTRAVENOUS
  Filled 2017-03-05 (×3): qty 1

## 2017-03-05 MED ORDER — KETOROLAC TROMETHAMINE 60 MG/2ML IM SOLN: 60.0000 mg | Freq: Three times a day (TID) | INTRAMUSCULAR | Status: DC

## 2017-03-05 MED ORDER — HYDROMORPHONE HCL 1 MG/ML IJ SOLN
1.0000 mg | INTRAMUSCULAR | Status: DC | PRN
Start: 2017-03-05 — End: 2017-03-12
  Administered 2017-03-06 – 2017-03-11 (×6): 1 mg via INTRAVENOUS
  Filled 2017-03-05 (×6): qty 1

## 2017-03-05 MED ORDER — WARFARIN SODIUM 2.5 MG PO TABS
2.5000 mg | ORAL_TABLET | Freq: Once | ORAL | Status: AC
  Administered 2017-03-05: 2.5 mg via ORAL
  Filled 2017-03-05: qty 1

## 2017-03-05 NOTE — Progress Notes (Signed)
Pt. Has less tenderness Left leg today. Rubor still intense. On Rebecca [name] YIF:[id number] DOB: 02-Jun-1940 DOA: 03/03/2017 PCP: Rebecca Linsey, MD   Physical Exam: Blood pressure (!) 142/54, pulse 86, temperature 98.7 F (37.1 C), temperature source Oral, resp. rate 18, height 5\' 8"  (1.727 m), weight 101.9 kg (224 lb 10.4 oz), SpO2 93 %.Lungs clear heart Reg rhythm no s3 s4 . Leg + rubor and tenderness   Investigations:  Recent Results (from the past 240 hour(s))  Culture, blood (routine x 2)     Status: None (Preliminary result)   Collection Time: 03/03/17 11:00 AM  Result Value Ref Range Status   Specimen Description RIGHT ANTECUBITAL  Final   Special Requests   Final    BOTTLES DRAWN AEROBIC AND ANAEROBIC Blood Culture adequate volume   Culture NO GROWTH 2 DAYS  Final   Report Status PENDING  Incomplete  Culture, blood (routine x 2)     Status: None (Preliminary result)   Collection Time: 03/03/17 11:06 AM  Result Value Ref Range Status   Specimen Description LEFT ANTECUBITAL  Final   Special Requests   Final    BOTTLES DRAWN AEROBIC AND ANAEROBIC Blood Culture adequate volume   Culture NO GROWTH 2 DAYS  Final   Report Status PENDING  Incomplete     Basic Metabolic Panel: Recent Labs    03/04/17 0430 03/05/17 0520  NA 133* 134*  K 4.1 4.0  CL 97* 100*  CO2 24 22  GLUCOSE 190* 139*  BUN 23* 23*  CREATININE 1.06* 1.04*  CALCIUM 8.8* 8.5*   Liver Function Tests: Recent Labs    03/03/17 1040  AST 20  ALT 16  ALKPHOS 67  BILITOT 0.7  PROT 7.6  ALBUMIN 3.4*     CBC: Recent Labs    03/03/17 1040 03/04/17 0430  WBC 12.3* 14.8*  NEUTROABS 9.4*  --   HGB 13.3 12.0  HCT 40.8 37.1  MCV 88.9 89.0  PLT 250 251    No results found.    Medications:  Impression:  Active Problems:   Essential hypertension   Chronic diastolic CHF (congestive heart failure) (HCC)   Chronic anticoagulation-Coumadin   Cellulitis   Type 2 diabetes mellitus with  other specified complication (HCC)   CKD (chronic kidney disease), stage III (HCC)     Plan:continue vanc , coumadin    Consultants:    Procedures   Antibiotics: Vancomycin      Time spent:30 min   LOS: 2 days   Tramon Crescenzo M   03/05/2017, 12:22 PM

## 2017-03-05 NOTE — Progress Notes (Signed)
ANTICOAGULATION CONSULT NOTE - Follow up Consult  Pharmacy Consult for Warfarin Indication: Hx DVT  Allergies  Allergen Reactions  . Codeine Anxiety  . Compazine Anaphylaxis  . Other Anaphylaxis, Rash and Other (See Comments)    Uncoded Allergy. Allergen: INOVAR Uncoded Allergy. Allergen: ALL ADHESIVES Uncoded Allergy. Allergen: SILK SUTURE Uncoded Allergy. Allergen: merthiolate Thermasol  . Penicillins Shortness Of Breath and Rash    Has patient had a PCN reaction causing immediate rash, facial/tongue/throat swelling, SOB or lightheadedness with hypotension: Yes Has patient had a PCN reaction causing severe rash involving mucus membranes or skin necrosis: No Has patient had a PCN reaction that required hospitalization No Has patient had a PCN reaction occurring within the last 10 years: Yes If all of the above answers are "NO", then may proceed with Cephalosporin use.   Marland Kitchen Morphine And Related Other (See Comments)    Patient states that it makes her lose her state of mind.   . Demerol [Meperidine] Rash    Patient Measurements: Height: 5\' 8"  (172.7 cm) Weight: 224 lb 10.4 oz (101.9 kg) IBW/kg (Calculated) : 63.9  Vital Signs: BP: 142/54 (01/30 0644) Pulse Rate: 86 (01/30 0644)  Labs: Recent Labs    03/03/17 1040 03/04/17 0430 03/05/17 0520  HGB 13.3 12.0  --   HCT 40.8 37.1  --   PLT 250 251  --   LABPROT 25.0* 25.7* 28.5*  INR 2.28 2.37 2.70  CREATININE 1.18* 1.06* 1.04*    Estimated Creatinine Clearance: 56.6 mL/min (A) (by C-G formula based on SCr of 1.04 mg/dL (H)).   Medical History: Past Medical History:  Diagnosis Date  . Aortic stenosis   . Cataplexy   . Diastolic heart failure (HCC)   . Essential hypertension   . GERD (gastroesophageal reflux disease)   . Hiatal hernia   . History of recurrent deep vein thrombosis (DVT)   . Mitral stenosis   . Narcolepsy   . Neuropathy   . Type 2 diabetes mellitus (HCC)     Medications:  Medications Prior  to Admission  Medication Sig Dispense Refill Last Dose  . BYDUREON 2 MG PEN Inject 2 mg into the skin once a week.  5 Past Week at Unknown time  . carvedilol (COREG) 3.125 MG tablet Take 1 tablet (3.125 mg total) by mouth 2 (two) times daily with a meal. 60 tablet 6 03/03/2017 at 0800  . celecoxib (CELEBREX) 200 MG capsule Take 200 mg by mouth daily. For arthritis   03/03/2017 at Unknown time  . Cholecalciferol (VITAMIN D3) 3000 UNITS TABS Take 1 capsule by mouth daily.   unknown  . clomiPRAMINE (ANAFRANIL) 25 MG capsule Take 25 mg by mouth 4 (four) times daily.    03/03/2017 at Unknown time  . clonazePAM (KLONOPIN) 1 MG tablet Take 1 mg by mouth at bedtime.    03/03/2017 at Unknown time  . Coenzyme Q10 (CO Q10) 200 MG CAPS Take 2 capsules by mouth daily.    unknown  . DULoxetine (CYMBALTA) 60 MG capsule Take 60 mg by mouth daily.     03/03/2017 at Unknown time  . ferrous sulfate 325 (65 FE) MG tablet Take 325 mg by mouth daily with breakfast.   03/03/2017 at Unknown time  . furosemide (LASIX) 40 MG tablet Take 20 mg by mouth 2 (two) times daily.   5 03/03/2017 at Unknown time  . Hyaluronic Acid-Vitamin C (HYALURONIC ACID PO) Take 100 mg by mouth daily.   unknown  . insulin glargine (  LANTUS) 100 unit/mL SOPN Inject 24 Units into the skin at bedtime.    03/03/2017 at Unknown time  . magnesium oxide (MAG-OX) 400 MG tablet Take 400 mg by mouth daily.   unknown  . metFORMIN (GLUCOPHAGE) 500 MG tablet Take 500 mg by mouth 2 (two) times daily with a meal.     03/03/2017 at Unknown time  . mirtazapine (REMERON) 15 MG tablet Take 15 mg by mouth at bedtime.     03/03/2017 at Unknown time  . potassium chloride (K-DUR) 10 MEQ tablet Take 1 tablet (10 mEq total) by mouth daily. 90 tablet 3 03/03/2017 at Unknown time  . Probiotic Product (PROBIOTIC ADVANCED PO) Take 1 capsule by mouth daily.   unknown  . QUEtiapine (SEROQUEL) 100 MG tablet Take 100 mg by mouth at bedtime.   03/03/2017 at Unknown time  . warfarin  (COUMADIN) 5 MG tablet Take 2.5-5 mg by mouth every evening. Starting 10/29/2016 take 2.5 mg alternating with 5 mg daily    03/03/2017   Assessment: Okay for Protocol, INR at goal. Med rec has patient taking 2.5mg  alternating with 5mg  daily. INR remains therapeutic today  Goal of Therapy:  INR 2-3   Plan:  Warfarin 2.5mg  PO x 1 Daily PT / INR Monitor for signs and symptoms of bleeding.    Judeth Cornfield, PharmD Clinical Pharmacist 03/05/2017 10:11 AM

## 2017-03-06 ENCOUNTER — Encounter (HOSPITAL_COMMUNITY): Payer: Self-pay | Admitting: Student

## 2017-03-06 LAB — PROTIME-INR
INR: 2.86
Prothrombin Time: 29.8 seconds — ABNORMAL HIGH (ref 11.4–15.2)

## 2017-03-06 LAB — GLUCOSE, CAPILLARY
GLUCOSE-CAPILLARY: 142 mg/dL — AB (ref 65–99)
GLUCOSE-CAPILLARY: 147 mg/dL — AB (ref 65–99)
Glucose-Capillary: 214 mg/dL — ABNORMAL HIGH (ref 65–99)
Glucose-Capillary: 65 mg/dL (ref 65–99)
Glucose-Capillary: 132 mg/dL — ABNORMAL HIGH (ref 65–99)

## 2017-03-06 LAB — VANCOMYCIN, TROUGH: Vancomycin Tr: 12 ug/mL — ABNORMAL LOW (ref 15–20)

## 2017-03-06 MED ORDER — DOCUSATE SODIUM 100 MG PO CAPS
100.0000 mg | ORAL_CAPSULE | Freq: Every day | ORAL | Status: DC | PRN
  Administered 2017-03-06 – 2017-03-10 (×2): 100 mg via ORAL
  Filled 2017-03-06 (×2): qty 1

## 2017-03-06 MED ORDER — WARFARIN SODIUM 2.5 MG PO TABS
2.5000 mg | ORAL_TABLET | Freq: Once | ORAL | Status: AC
  Administered 2017-03-06: 2.5 mg via ORAL
  Filled 2017-03-06: qty 1

## 2017-03-06 MED ORDER — INSULIN GLARGINE 100 UNIT/ML ~~LOC~~ SOLN
18.0000 [IU] | Freq: Every day | SUBCUTANEOUS | Status: DC
  Administered 2017-03-06 – 2017-03-11 (×6): 18 [IU] via SUBCUTANEOUS
  Filled 2017-03-06 (×7): qty 0.18

## 2017-03-06 NOTE — Progress Notes (Signed)
Inpatient Diabetes Program Recommendations  AACE/ADA: New Consensus Statement on Inpatient Glycemic Control (2015)  Target Ranges:  Prepandial:   less than 140 mg/dL      Peak postprandial:   less than 180 mg/dL (1-2 hours)      Critically ill patients:  140 - 180 mg/dL   Results for NEREIDA, MOERMAN (MRN [id number]) as of 03/06/2017 09:50  Ref. Range 03/05/2017 07:46 03/05/2017 11:33 03/05/2017 16:30 03/05/2017 20:34 03/06/2017 07:40  Glucose-Capillary Latest Ref Range: 65 - 99 mg/dL 384 (H) 665 (H) 993 (H) 321 (H) 65   Review of Glycemic Control  Diabetes history: DM2 Outpatient Diabetes medications: Lantus 24 units QHS, Metformin 500 mg BID, Bydureon 2 mg once a week (Tuesday) Current orders for Inpatient glycemic control: Lantus 24 units QHS, Novolog 0-15 units TID with meals, Novolog 0-5 units QHS  Inpatient Diabetes Program Recommendations: Insulin - Basal: Noted fasting glucose 65 mg/dl this morning. Please consider decreasing Lantus to 22 units QHS.  Thanks, Orlando Penner, RN, MSN, CDE Diabetes Coordinator Inpatient Diabetes Program [phone] (Team Pager from 8am to 5pm)

## 2017-03-06 NOTE — Progress Notes (Signed)
Patient's CBG 65 this AM. Patient is asymptomatic. Will give container of juice and continue to monitor patient. Morning SSI not given due to order parameters not met for administration.

## 2017-03-06 NOTE — Progress Notes (Signed)
Patient has left leg cellulitis slowly resolving on vancomycin IV. Anticoagulation within normal parameters according to INR renal function preserved on vancomycin. She had a fasting blood sugar 65 this morning we will decrease Lantus from 22 daily To 18 un9its at bedtime continue the sliding scale TALESHIA MORMANN KWI:[id number] DOB: [birth year] DOA: 03/03/2017 PCP: [name], MD   Physical Exam: Blood pressure 116/60, pulse 80, temperature (!) 97.5 F (36.4 C), temperature source Axillary, resp. rate 18, height 5\' 8"  (1.727 m), weight 101.9 kg (224 lb 10.4 oz), SpO2 95 %.lungs diminished breath sounds in the bases no rales wheeze rhonchi appreciable heart regular rhythm no S3-S4 no heaves thrills rubs left leg mildly less rubor andtenderness of left lower extremity than 24 hours ago   Investigations:  Recent Results (from the past 240 hour(s))  Culture, blood (routine x 2)     Status: None (Preliminary result)   Collection Time: 03/03/17 11:00 AM  Result Value Ref Range Status   Specimen Description RIGHT ANTECUBITAL  Final   Special Requests   Final    BOTTLES DRAWN AEROBIC AND ANAEROBIC Blood Culture adequate volume   Culture NO GROWTH 3 DAYS  Final   Report Status PENDING  Incomplete  Culture, blood (routine x 2)     Status: None (Preliminary result)   Collection Time: 03/03/17 11:06 AM  Result Value Ref Range Status   Specimen Description LEFT ANTECUBITAL  Final   Special Requests   Final    BOTTLES DRAWN AEROBIC AND ANAEROBIC Blood Culture adequate volume   Culture NO GROWTH 3 DAYS  Final   Report Status PENDING  Incomplete     Basic Metabolic Panel: Recent Labs    03/04/17 0430 03/05/17 0520  NA 133* 134*  K 4.1 4.0  CL 97* 100*  CO2 24 22  GLUCOSE 190* 139*  BUN 23* 23*  CREATININE 1.06* 1.04*  CALCIUM 8.8* 8.5*   Liver Function Tests: No results for input(s): AST, ALT, ALKPHOS, BILITOT, PROT, ALBUMIN in the last 72 hours.   CBC: Recent Labs     03/04/17 0430  WBC 14.8*  HGB 12.0  HCT 37.1  MCV 89.0  PLT 251    No results found.    Medications:   Impression:  Active Problems:   Essential hypertension   Chronic diastolic CHF (congestive heart failure) (HCC)   Chronic anticoagulation-Coumadin   Cellulitis   Type 2 diabetes mellitus with other specified complication (HCC)   CKD (chronic kidney disease), stage III (HCC)     Plan:continue IV vancomycin. Continue Coumadin as per pharmacy protocol. Monitor renal function.  Consultants: pharmacy    Proceduresvenous Dopplers on admission   Antibiotics: vancomycin       Time spent:30 minutes   LOS: 3 days   Cheikh Bramble M   03/06/2017, 1:21 PM

## 2017-03-06 NOTE — Progress Notes (Signed)
ANTICOAGULATION CONSULT NOTE - Follow up Consult  Pharmacy Consult for Warfarin Indication: Hx DVT  Allergies  Allergen Reactions  . Codeine Anxiety  . Compazine Anaphylaxis  . Other Anaphylaxis, Rash and Other (See Comments)    Uncoded Allergy. Allergen: INOVAR Uncoded Allergy. Allergen: ALL ADHESIVES Uncoded Allergy. Allergen: SILK SUTURE Uncoded Allergy. Allergen: merthiolate Thermasol  . Penicillins Shortness Of Breath and Rash    Has patient had a PCN reaction causing immediate rash, facial/tongue/throat swelling, SOB or lightheadedness with hypotension: Yes Has patient had a PCN reaction causing severe rash involving mucus membranes or skin necrosis: No Has patient had a PCN reaction that required hospitalization No Has patient had a PCN reaction occurring within the last 10 years: Yes If all of the above answers are "NO", then may proceed with Cephalosporin use.   Marland Kitchen Morphine And Related Other (See Comments)    Patient states that it makes her lose her state of mind.   . Demerol [Meperidine] Rash    Patient Measurements: Height: 5\' 8"  (172.7 cm) Weight: 224 lb 10.4 oz (101.9 kg) IBW/kg (Calculated) : 63.9  Vital Signs: Temp: 97.5 F (36.4 C) (01/31 0505) Temp Source: Axillary (01/31 0505) BP: 116/60 (01/31 0505) Pulse Rate: 80 (01/31 0505)  Labs: Recent Labs    03/03/17 1040 03/04/17 0430 03/05/17 0520 03/06/17 0428  HGB 13.3 12.0  --   --   HCT 40.8 37.1  --   --   PLT 250 251  --   --   LABPROT 25.0* 25.7* 28.5* 29.8*  INR 2.28 2.37 2.70 2.86  CREATININE 1.18* 1.06* 1.04*  --     Estimated Creatinine Clearance: 56.6 mL/min (A) (by C-G formula based on SCr of 1.04 mg/dL (H)).   Medical History: Past Medical History:  Diagnosis Date  . Aortic stenosis   . Cataplexy   . Diastolic heart failure (HCC)   . Essential hypertension   . GERD (gastroesophageal reflux disease)   . Hiatal hernia   . History of recurrent deep vein thrombosis (DVT)   .  Mitral stenosis   . Narcolepsy   . Neuropathy   . Type 2 diabetes mellitus (HCC)     Medications:  Medications Prior to Admission  Medication Sig Dispense Refill Last Dose  . BYDUREON 2 MG PEN Inject 2 mg into the skin once a week.  5 Past Week at Unknown time  . carvedilol (COREG) 3.125 MG tablet Take 1 tablet (3.125 mg total) by mouth 2 (two) times daily with a meal. 60 tablet 6 03/03/2017 at 0800  . celecoxib (CELEBREX) 200 MG capsule Take 200 mg by mouth daily. For arthritis   03/03/2017 at Unknown time  . Cholecalciferol (VITAMIN D3) 3000 UNITS TABS Take 1 capsule by mouth daily.   unknown  . clomiPRAMINE (ANAFRANIL) 25 MG capsule Take 25 mg by mouth 4 (four) times daily.    03/03/2017 at Unknown time  . clonazePAM (KLONOPIN) 1 MG tablet Take 1 mg by mouth at bedtime.    03/03/2017 at Unknown time  . Coenzyme Q10 (CO Q10) 200 MG CAPS Take 2 capsules by mouth daily.    unknown  . DULoxetine (CYMBALTA) 60 MG capsule Take 60 mg by mouth daily.     03/03/2017 at Unknown time  . ferrous sulfate 325 (65 FE) MG tablet Take 325 mg by mouth daily with breakfast.   03/03/2017 at Unknown time  . furosemide (LASIX) 40 MG tablet Take 20 mg by mouth 2 (two) times daily.  5 03/03/2017 at Unknown time  . Hyaluronic Acid-Vitamin C (HYALURONIC ACID PO) Take 100 mg by mouth daily.   unknown  . insulin glargine (LANTUS) 100 unit/mL SOPN Inject 24 Units into the skin at bedtime.    03/03/2017 at Unknown time  . magnesium oxide (MAG-OX) 400 MG tablet Take 400 mg by mouth daily.   unknown  . metFORMIN (GLUCOPHAGE) 500 MG tablet Take 500 mg by mouth 2 (two) times daily with a meal.     03/03/2017 at Unknown time  . mirtazapine (REMERON) 15 MG tablet Take 15 mg by mouth at bedtime.     03/03/2017 at Unknown time  . potassium chloride (K-DUR) 10 MEQ tablet Take 1 tablet (10 mEq total) by mouth daily. 90 tablet 3 03/03/2017 at Unknown time  . Probiotic Product (PROBIOTIC ADVANCED PO) Take 1 capsule by mouth daily.    unknown  . QUEtiapine (SEROQUEL) 100 MG tablet Take 100 mg by mouth at bedtime.   03/03/2017 at Unknown time  . warfarin (COUMADIN) 5 MG tablet Take 2.5-5 mg by mouth every evening. Starting 10/29/2016 take 2.5 mg alternating with 5 mg daily    03/03/2017   Assessment: Okay for Protocol, INR at goal. Med rec has patient taking 2.5mg  alternating with 5mg  daily. INR remains therapeutic today but trending up and at upper end of range, will only give 2.5mg  today.  Goal of Therapy:  INR 2-3   Plan:  Warfarin 2.5mg  PO x 1 today Daily PT / INR Monitor for signs and symptoms of bleeding.   Elder Cyphers, BS Pharm D, [place] Clinical Pharmacist Pager [phone]  03/06/2017 8:41 AM

## 2017-03-06 NOTE — Progress Notes (Signed)
Pharmacy Antibiotic Note  Rebecca [name] is a 77 y.o. female admitted on 03/03/2017 with cellulitis.  Pharmacy has been consulted for Vancomycin dosing.  Trough 1/31 is 12: in range of goal of 10-15  Plan: Continue Vancomycin 1250 IV every 24 hours.  Goal trough 10-15 mcg/mL.  Monitor labs, c/s, and vanco trough as needed  Height: 5\' 8"  (172.7 cm) Weight: 224 lb 10.4 oz (101.9 kg) IBW/kg (Calculated) : 63.9  Temp (24hrs), Avg:97.6 F (36.4 C), Min:97.5 F (36.4 C), Max:97.7 F (36.5 C)  Recent Labs  Lab 03/03/17 1040 03/03/17 1241 03/04/17 0430 03/05/17 0520 03/06/17 1441  WBC 12.3*  --  14.8*  --   --   CREATININE 1.18*  --  1.06* 1.04*  --   LATICACIDVEN  --  1.08  --   --   --   VANCOTROUGH  --   --   --   --  12*    Estimated Creatinine Clearance: 56.6 mL/min (A) (by C-G formula based on SCr of 1.04 mg/dL (H)).    Allergies  Allergen Reactions  . Codeine Anxiety  . Compazine Anaphylaxis  . Other Anaphylaxis, Rash and Other (See Comments)    Uncoded Allergy. Allergen: INOVAR Uncoded Allergy. Allergen: ALL ADHESIVES Uncoded Allergy. Allergen: SILK SUTURE Uncoded Allergy. Allergen: merthiolate Thermasol  . Penicillins Shortness Of Breath and Rash    Has patient had a PCN reaction causing immediate rash, facial/tongue/throat swelling, SOB or lightheadedness with hypotension: Yes Has patient had a PCN reaction causing severe rash involving mucus membranes or skin necrosis: No Has patient had a PCN reaction that required hospitalization No Has patient had a PCN reaction occurring within the last 10 years: Yes If all of the above answers are "NO", then may proceed with Cephalosporin use.   Marland Kitchen Morphine And Related Other (See Comments)    Patient states that it makes her lose her state of mind.   . Demerol [Meperidine] Rash   Antimicrobials this admission: 1/28 Vanco >>     Dose adjustments this admission: Obesity/Normalized CrCl Vancomycin dosing protocol will be  initiated with an estimated normalized CrCl = 45 ml/min.   Microbiology results: 1/28 BCx: NGTD x 3 days  Thank you for allowing pharmacy to be a part of this patient's care.  Tad Moore, [facility] 03/06/2017 5:24 PM

## 2017-03-07 LAB — BASIC METABOLIC PANEL
ANION GAP: 10 (ref 5–15)
BUN: 28 mg/dL — ABNORMAL HIGH (ref 6–20)
CO2: 27 mmol/L (ref 22–32)
Calcium: 8.7 mg/dL — ABNORMAL LOW (ref 8.9–10.3)
Chloride: 98 mmol/L — ABNORMAL LOW (ref 101–111)
Creatinine, Ser: 1.1 mg/dL — ABNORMAL HIGH (ref 0.44–1.00)
GFR calc non Af Amer: 47 mL/min — ABNORMAL LOW (ref >60)
GFR, EST AFRICAN AMERICAN: 55 mL/min — AB (ref >60)
Glucose, Bld: 134 mg/dL — ABNORMAL HIGH (ref 65–99)
Potassium: 4.3 mmol/L (ref 3.5–5.1)
Sodium: 135 mmol/L (ref 135–145)

## 2017-03-07 LAB — PROTIME-INR
INR: 2.78
Prothrombin Time: 29.1 seconds — ABNORMAL HIGH (ref 11.4–15.2)

## 2017-03-07 LAB — GLUCOSE, CAPILLARY
GLUCOSE-CAPILLARY: 236 mg/dL — AB (ref 65–99)
GLUCOSE-CAPILLARY: 157 mg/dL — AB (ref 65–99)
Glucose-Capillary: 115 mg/dL — ABNORMAL HIGH (ref 65–99)
Glucose-Capillary: 145 mg/dL — ABNORMAL HIGH (ref 65–99)

## 2017-03-07 MED ORDER — WARFARIN SODIUM 2.5 MG PO TABS
2.5000 mg | ORAL_TABLET | Freq: Once | ORAL | Status: AC
  Administered 2017-03-07: 2.5 mg via ORAL
  Filled 2017-03-07: qty 1

## 2017-03-07 NOTE — Care Management Important Message (Signed)
Important Message  Patient Details  Name: Rebecca [name] MRN: [id number] Date of Birth: 07/22/1940   Medicare Important Message Given:  Yes    Tristian Sickinger, Chrystine Oiler, RN 03/07/2017, 12:22 PM

## 2017-03-07 NOTE — Progress Notes (Signed)
INR 2.78 Less rubor and tenderness. Rebecca [name] QIW:[id number] DOB: February 20, 1940 DOA: 03/03/2017 PCP: [name], MD   Physical Exam: Blood pressure (!) 135/56, pulse 87, temperature 98 F (36.7 C), temperature source Oral, resp. rate 18, height 5\' 8"  (1.727 m), weight 101.9 kg (224 lb 10.4 oz), SpO2 95 %.Lungs - diminished bs at bases no rales wheeeze or ronchi. Leg- less rubor erythema and tenderness   Investigations:  Recent Results (from the past 240 hour(s))  Culture, blood (routine x 2)     Status: None (Preliminary result)   Collection Time: 03/03/17 11:00 AM  Result Value Ref Range Status   Specimen Description RIGHT ANTECUBITAL  Final   Special Requests   Final    BOTTLES DRAWN AEROBIC AND ANAEROBIC Blood Culture adequate volume   Culture   Final    NO GROWTH 4 DAYS Performed at [facility], [address] [place], [place] [address]    Report Status PENDING  Incomplete  Culture, blood (routine x 2)     Status: None (Preliminary result)   Collection Time: 03/03/17 11:06 AM  Result Value Ref Range Status   Specimen Description LEFT ANTECUBITAL  Final   Special Requests   Final    BOTTLES DRAWN AEROBIC AND ANAEROBIC Blood Culture adequate volume   Culture   Final    NO GROWTH 4 DAYS Performed at [facility], [address] [place], [place] [address]    Report Status PENDING  Incomplete     Basic Metabolic Panel: Recent Labs    03/05/17 0520 03/07/17 0551  NA 134* 135  K 4.0 4.3  CL 100* 98*  CO2 22 27  GLUCOSE 139* 134*  BUN 23* 28*  CREATININE 1.04* 1.10*  CALCIUM 8.5* 8.7*   Liver Function Tests: No results for input(s): AST, ALT, ALKPHOS, BILITOT, PROT, ALBUMIN in the last 72 hours.   CBC: No results for input(s): WBC, NEUTROABS, HGB, HCT, MCV, PLT in the last 72 hours.  No results found.    Medications:   Impression:  Active Problems:   Essential hypertension   Chronic diastolic CHF (congestive heart failure) (HCC)   Chronic  anticoagulation-Coumadin   Cellulitis   Type 2 diabetes mellitus with other specified complication (HCC)   CKD (chronic kidney disease), stage III (HCC)     Plan:continue vancomycin monitor renal function and INR  Consultants: Pharmacy    Procedures   Antibiotics: Vancomycin         Time spent:30 minutes   LOS: 4 days   Manolo Bosket M   03/07/2017, 1:37 PM

## 2017-03-07 NOTE — Progress Notes (Signed)
ANTICOAGULATION CONSULT NOTE - Follow up Consult  Pharmacy Consult for Warfarin Indication: Hx DVT  Allergies  Allergen Reactions  . Codeine Anxiety  . Compazine Anaphylaxis  . Other Anaphylaxis, Rash and Other (See Comments)    Uncoded Allergy. Allergen: INOVAR Uncoded Allergy. Allergen: ALL ADHESIVES Uncoded Allergy. Allergen: SILK SUTURE Uncoded Allergy. Allergen: merthiolate Thermasol  . Penicillins Shortness Of Breath and Rash    Has patient had a PCN reaction causing immediate rash, facial/tongue/throat swelling, SOB or lightheadedness with hypotension: Yes Has patient had a PCN reaction causing severe rash involving mucus membranes or skin necrosis: No Has patient had a PCN reaction that required hospitalization No Has patient had a PCN reaction occurring within the last 10 years: Yes If all of the above answers are "NO", then may proceed with Cephalosporin use.   Marland Kitchen Morphine And Related Other (See Comments)    Patient states that it makes her lose her state of mind.   . Demerol [Meperidine] Rash    Patient Measurements: Height: 5\' 8"  (172.7 cm) Weight: 224 lb 10.4 oz (101.9 kg) IBW/kg (Calculated) : 63.9  Vital Signs: Temp: 98 F (36.7 C) (02/01 0515) Temp Source: Oral (02/01 0515) BP: 135/56 (02/01 0515) Pulse Rate: 87 (02/01 0515)  Labs: Recent Labs    03/05/17 0520 03/06/17 0428 03/07/17 0551  LABPROT 28.5* 29.8* 29.1*  INR 2.70 2.86 2.78  CREATININE 1.04*  --  1.10*    Estimated Creatinine Clearance: 53.5 mL/min (A) (by C-G formula based on SCr of 1.1 mg/dL (H)).   Medical History: Past Medical History:  Diagnosis Date  . Aortic stenosis   . Cataplexy   . Diastolic heart failure (HCC)   . Essential hypertension   . GERD (gastroesophageal reflux disease)   . Hiatal hernia   . History of recurrent deep vein thrombosis (DVT)   . Mitral stenosis   . Narcolepsy   . Neuropathy   . Type 2 diabetes mellitus (HCC)     Medications:  Medications  Prior to Admission  Medication Sig Dispense Refill Last Dose  . BYDUREON 2 MG PEN Inject 2 mg into the skin once a week.  5 Past Week at Unknown time  . carvedilol (COREG) 3.125 MG tablet Take 1 tablet (3.125 mg total) by mouth 2 (two) times daily with a meal. 60 tablet 6 03/03/2017 at 0800  . celecoxib (CELEBREX) 200 MG capsule Take 200 mg by mouth daily. For arthritis   03/03/2017 at Unknown time  . Cholecalciferol (VITAMIN D3) 3000 UNITS TABS Take 1 capsule by mouth daily.   unknown  . clomiPRAMINE (ANAFRANIL) 25 MG capsule Take 25 mg by mouth 4 (four) times daily.    03/03/2017 at Unknown time  . clonazePAM (KLONOPIN) 1 MG tablet Take 1 mg by mouth at bedtime.    03/03/2017 at Unknown time  . Coenzyme Q10 (CO Q10) 200 MG CAPS Take 2 capsules by mouth daily.    unknown  . DULoxetine (CYMBALTA) 60 MG capsule Take 60 mg by mouth daily.     03/03/2017 at Unknown time  . ferrous sulfate 325 (65 FE) MG tablet Take 325 mg by mouth daily with breakfast.   03/03/2017 at Unknown time  . furosemide (LASIX) 40 MG tablet Take 20 mg by mouth 2 (two) times daily.   5 03/03/2017 at Unknown time  . Hyaluronic Acid-Vitamin C (HYALURONIC ACID PO) Take 100 mg by mouth daily.   unknown  . insulin glargine (LANTUS) 100 unit/mL SOPN Inject 24 Units  into the skin at bedtime.    03/03/2017 at Unknown time  . magnesium oxide (MAG-OX) 400 MG tablet Take 400 mg by mouth daily.   unknown  . metFORMIN (GLUCOPHAGE) 500 MG tablet Take 500 mg by mouth 2 (two) times daily with a meal.     03/03/2017 at Unknown time  . mirtazapine (REMERON) 15 MG tablet Take 15 mg by mouth at bedtime.     03/03/2017 at Unknown time  . potassium chloride (K-DUR) 10 MEQ tablet Take 1 tablet (10 mEq total) by mouth daily. 90 tablet 3 03/03/2017 at Unknown time  . Probiotic Product (PROBIOTIC ADVANCED PO) Take 1 capsule by mouth daily.   unknown  . QUEtiapine (SEROQUEL) 100 MG tablet Take 100 mg by mouth at bedtime.   03/03/2017 at Unknown time  . warfarin  (COUMADIN) 5 MG tablet Take 2.5-5 mg by mouth every evening. Starting 10/29/2016 take 2.5 mg alternating with 5 mg daily    03/03/2017   Assessment: INR at goal. Coumadin 2.5mg  today.  Goal of Therapy:  INR 2-3   Plan:  Warfarin 2.5mg  PO x 1 today Daily PT / INR Monitor for signs and symptoms of bleeding.   Thanks for allowing pharmacy to be a part of this patient's care.  Talbert Cage, PharmD Clinical Pharmacist  03/07/2017 8:21 AM

## 2017-03-08 LAB — PROTIME-INR
INR: 2.5
Prothrombin Time: 26.8 seconds — ABNORMAL HIGH (ref 11.4–15.2)

## 2017-03-08 LAB — GLUCOSE, CAPILLARY
GLUCOSE-CAPILLARY: 171 mg/dL — AB (ref 65–99)
GLUCOSE-CAPILLARY: 145 mg/dL — AB (ref 65–99)
Glucose-Capillary: 124 mg/dL — ABNORMAL HIGH (ref 65–99)
Glucose-Capillary: 160 mg/dL — ABNORMAL HIGH (ref 65–99)

## 2017-03-08 LAB — CULTURE, BLOOD (ROUTINE X 2)
Culture: NO GROWTH
Culture: NO GROWTH
SPECIAL REQUESTS: ADEQUATE
SPECIAL REQUESTS: ADEQUATE

## 2017-03-08 MED ORDER — WARFARIN SODIUM 5 MG PO TABS
5.0000 mg | ORAL_TABLET | Freq: Once | ORAL | Status: AC
  Administered 2017-03-08: 5 mg via ORAL
  Filled 2017-03-08: qty 1

## 2017-03-08 NOTE — Progress Notes (Signed)
ANTICOAGULATION CONSULT NOTE - Follow up Consult  Pharmacy Consult for Warfarin Indication: Hx DVT  Allergies  Allergen Reactions  . Codeine Anxiety  . Compazine Anaphylaxis  . Other Anaphylaxis, Rash and Other (See Comments)    Uncoded Allergy. Allergen: INOVAR Uncoded Allergy. Allergen: ALL ADHESIVES Uncoded Allergy. Allergen: SILK SUTURE Uncoded Allergy. Allergen: merthiolate Thermasol  . Penicillins Shortness Of Breath and Rash    Has patient had a PCN reaction causing immediate rash, facial/tongue/throat swelling, SOB or lightheadedness with hypotension: Yes Has patient had a PCN reaction causing severe rash involving mucus membranes or skin necrosis: No Has patient had a PCN reaction that required hospitalization No Has patient had a PCN reaction occurring within the last 10 years: Yes If all of the above answers are "NO", then may proceed with Cephalosporin use.   Marland Kitchen Morphine And Related Other (See Comments)    Patient states that it makes her lose her state of mind.   . Demerol [Meperidine] Rash    Patient Measurements: Height: 5\' 8"  (172.7 cm) Weight: 224 lb 10.4 oz (101.9 kg) IBW/kg (Calculated) : 63.9  Vital Signs: Temp: 97.6 F (36.4 C) (02/02 0543) Temp Source: Oral (02/02 0543) BP: 140/54 (02/02 0543) Pulse Rate: 88 (02/02 0543)  Labs: Recent Labs    03/06/17 0428 03/07/17 0551 03/08/17 0703  LABPROT 29.8* 29.1* 26.8*  INR 2.86 2.78 2.50  CREATININE  --  1.10*  --     Estimated Creatinine Clearance: 53.5 mL/min (A) (by C-G formula based on SCr of 1.1 mg/dL (H)).   Medical History: Past Medical History:  Diagnosis Date  . Aortic stenosis   . Cataplexy   . Diastolic heart failure (HCC)   . Essential hypertension   . GERD (gastroesophageal reflux disease)   . Hiatal hernia   . History of recurrent deep vein thrombosis (DVT)   . Mitral stenosis   . Narcolepsy   . Neuropathy   . Type 2 diabetes mellitus (HCC)     Medications:  Medications  Prior to Admission  Medication Sig Dispense Refill Last Dose  . BYDUREON 2 MG PEN Inject 2 mg into the skin once a week.  5 Past Week at Unknown time  . carvedilol (COREG) 3.125 MG tablet Take 1 tablet (3.125 mg total) by mouth 2 (two) times daily with a meal. 60 tablet 6 03/03/2017 at 0800  . celecoxib (CELEBREX) 200 MG capsule Take 200 mg by mouth daily. For arthritis   03/03/2017 at Unknown time  . Cholecalciferol (VITAMIN D3) 3000 UNITS TABS Take 1 capsule by mouth daily.   unknown  . clomiPRAMINE (ANAFRANIL) 25 MG capsule Take 25 mg by mouth 4 (four) times daily.    03/03/2017 at Unknown time  . clonazePAM (KLONOPIN) 1 MG tablet Take 1 mg by mouth at bedtime.    03/03/2017 at Unknown time  . Coenzyme Q10 (CO Q10) 200 MG CAPS Take 2 capsules by mouth daily.    unknown  . DULoxetine (CYMBALTA) 60 MG capsule Take 60 mg by mouth daily.     03/03/2017 at Unknown time  . ferrous sulfate 325 (65 FE) MG tablet Take 325 mg by mouth daily with breakfast.   03/03/2017 at Unknown time  . furosemide (LASIX) 40 MG tablet Take 20 mg by mouth 2 (two) times daily.   5 03/03/2017 at Unknown time  . Hyaluronic Acid-Vitamin C (HYALURONIC ACID PO) Take 100 mg by mouth daily.   unknown  . insulin glargine (LANTUS) 100 unit/mL SOPN Inject  24 Units into the skin at bedtime.    03/03/2017 at Unknown time  . magnesium oxide (MAG-OX) 400 MG tablet Take 400 mg by mouth daily.   unknown  . metFORMIN (GLUCOPHAGE) 500 MG tablet Take 500 mg by mouth 2 (two) times daily with a meal.     03/03/2017 at Unknown time  . mirtazapine (REMERON) 15 MG tablet Take 15 mg by mouth at bedtime.     03/03/2017 at Unknown time  . potassium chloride (K-DUR) 10 MEQ tablet Take 1 tablet (10 mEq total) by mouth daily. 90 tablet 3 03/03/2017 at Unknown time  . Probiotic Product (PROBIOTIC ADVANCED PO) Take 1 capsule by mouth daily.   unknown  . QUEtiapine (SEROQUEL) 100 MG tablet Take 100 mg by mouth at bedtime.   03/03/2017 at Unknown time  . warfarin  (COUMADIN) 5 MG tablet Take 2.5-5 mg by mouth every evening. Starting 10/29/2016 take 2.5 mg alternating with 5 mg daily    03/03/2017   Assessment: INR at goal. No bleeding reported. Coumadin 5mg  today.  Goal of Therapy:  INR 2-3   Plan:  Warfarin 5mg  PO x 1 today Daily PT / INR Monitor for signs and symptoms of bleeding.   Thanks for allowing pharmacy to be a part of this patient's care.  Talbert Cage, PharmD Clinical Pharmacist  03/08/2017 9:03 AM

## 2017-03-08 NOTE — Progress Notes (Signed)
INR 2.5 on vanc & Coumadin  Rebecca [name] YKZ:[id number] DOB: 14-Jun-1940 DOA: 03/03/2017 PCP: Rebecca Linsey, MD   Physical Exam: Blood pressure (!) 140/54, pulse 88, temperature 97.6 F (36.4 C), temperature source Oral, resp. rate 20, height 5\' 8"  (1.727 [name]), weight 101.9 kg (224 lb 10.4 oz), SpO2 95 %.Lungs no rales wheeze or ronchi . Leg less rubor tenderness   Investigations:  Recent Results (from the past 240 hour(s))  Culture, blood (routine x 2)     Status: None   Collection Time: 03/03/17 11:00 AM  Result Value Ref Range Status   Specimen Description RIGHT ANTECUBITAL  Final   Special Requests   Final    BOTTLES DRAWN AEROBIC AND ANAEROBIC Blood Culture adequate volume   Culture   Final    NO GROWTH 5 DAYS Performed at [facility], [address] [place], [place] [address]    Report Status 03/08/2017 FINAL  Final  Culture, blood (routine x 2)     Status: None   Collection Time: 03/03/17 11:06 AM  Result Value Ref Range Status   Specimen Description LEFT ANTECUBITAL  Final   Special Requests   Final    BOTTLES DRAWN AEROBIC AND ANAEROBIC Blood Culture adequate volume   Culture   Final    NO GROWTH 5 DAYS Performed at [facility], [address] [place], [place] [address]    Report Status 03/08/2017 FINAL  Final     Basic Metabolic Panel: Recent Labs    03/07/17 0551  NA 135  K 4.3  CL 98*  CO2 27  GLUCOSE 134*  BUN 28*  CREATININE 1.10*  CALCIUM 8.7*   Liver Function Tests: No results for input(s): AST, ALT, ALKPHOS, BILITOT, PROT, ALBUMIN in the last 72 hours.   CBC: No results for input(s): WBC, NEUTROABS, HGB, HCT, MCV, PLT in the last 72 hours.  No results found.    Medications:   Impression:  Active Problems:   Essential hypertension   Chronic diastolic CHF (congestive heart failure) (HCC)   Chronic anticoagulation-Coumadin   Cellulitis   Type 2 diabetes mellitus with other specified complication (HCC)   CKD (chronic kidney  disease), stage III (HCC)     Plan:continue Vanc and coumadin    Consultants: Pharmacy     Procedures   Antibiotics: vanco           Time spent:30 minutes   LOS: 5 days   Rebecca [name]   03/08/2017, 11:44 AM

## 2017-03-09 LAB — GLUCOSE, CAPILLARY
Glucose-Capillary: 121 mg/dL — ABNORMAL HIGH (ref 65–99)
Glucose-Capillary: 239 mg/dL — ABNORMAL HIGH (ref 65–99)
Glucose-Capillary: 147 mg/dL — ABNORMAL HIGH (ref 65–99)
Glucose-Capillary: 174 mg/dL — ABNORMAL HIGH (ref 65–99)

## 2017-03-09 LAB — BASIC METABOLIC PANEL
ANION GAP: 12 (ref 5–15)
BUN: 25 mg/dL — ABNORMAL HIGH (ref 6–20)
CALCIUM: 9.2 mg/dL (ref 8.9–10.3)
CO2: 27 mmol/L (ref 22–32)
Chloride: 99 mmol/L — ABNORMAL LOW (ref 101–111)
Creatinine, Ser: 1.07 mg/dL — ABNORMAL HIGH (ref 0.44–1.00)
GFR calc non Af Amer: 49 mL/min — ABNORMAL LOW (ref >60)
GFR, EST AFRICAN AMERICAN: 57 mL/min — AB (ref >60)
Glucose, Bld: 140 mg/dL — ABNORMAL HIGH (ref 65–99)
Potassium: 4.3 mmol/L (ref 3.5–5.1)
Sodium: 138 mmol/L (ref 135–145)

## 2017-03-09 LAB — PROTIME-INR
INR: 2.47
PROTHROMBIN TIME: 26.6 s — AB (ref 11.4–15.2)

## 2017-03-09 MED ORDER — WARFARIN SODIUM 2.5 MG PO TABS
2.5000 mg | ORAL_TABLET | Freq: Once | ORAL | Status: AC
  Administered 2017-03-09: 2.5 mg via ORAL
  Filled 2017-03-09: qty 1

## 2017-03-09 NOTE — Progress Notes (Signed)
Pt called me to her room because her wound had opened and began to drain. I drained the wound. A large amount of white/yellow pus drained from the opening. I dressed with gauze.

## 2017-03-09 NOTE — Progress Notes (Signed)
ANTICOAGULATION CONSULT NOTE - Follow up Consult ANTIBIOTIC CONSULT Pharmacy Consult for Warfarin, vancomycin Indication: Hx DVT, cellulitis  Allergies  Allergen Reactions  . Codeine Anxiety  . Compazine Anaphylaxis  . Other Anaphylaxis, Rash and Other (See Comments)    Uncoded Allergy. Allergen: INOVAR Uncoded Allergy. Allergen: ALL ADHESIVES Uncoded Allergy. Allergen: SILK SUTURE Uncoded Allergy. Allergen: merthiolate Thermasol  . Penicillins Shortness Of Breath and Rash    Has patient had a PCN reaction causing immediate rash, facial/tongue/throat swelling, SOB or lightheadedness with hypotension: Yes Has patient had a PCN reaction causing severe rash involving mucus membranes or skin necrosis: No Has patient had a PCN reaction that required hospitalization No Has patient had a PCN reaction occurring within the last 10 years: Yes If all of the above answers are "NO", then may proceed with Cephalosporin use.   Marland Kitchen Morphine And Related Other (See Comments)    Patient states that it makes her lose her state of mind.   . Demerol [Meperidine] Rash    Patient Measurements: Height: 5\' 8"  (172.7 cm) Weight: 224 lb 10.4 oz (101.9 kg) IBW/kg (Calculated) : 63.9  Vital Signs: Temp: 97.8 F (36.6 C) (02/03 0636) Temp Source: Oral (02/03 0636) BP: 126/91 (02/03 0636) Pulse Rate: 90 (02/03 0636)  Labs: Recent Labs    03/07/17 0551 03/08/17 0703 03/09/17 0639  LABPROT 29.1* 26.8* 26.6*  INR 2.78 2.50 2.47  CREATININE 1.10*  --  1.07*    Estimated Creatinine Clearance: 55 mL/min (A) (by C-G formula based on SCr of 1.07 mg/dL (H)).   Medical History: Past Medical History:  Diagnosis Date  . Aortic stenosis   . Cataplexy   . Diastolic heart failure (HCC)   . Essential hypertension   . GERD (gastroesophageal reflux disease)   . Hiatal hernia   . History of recurrent deep vein thrombosis (DVT)   . Mitral stenosis   . Narcolepsy   . Neuropathy   . Type 2 diabetes  mellitus (HCC)     Medications:  Medications Prior to Admission  Medication Sig Dispense Refill Last Dose  . BYDUREON 2 MG PEN Inject 2 mg into the skin once a week.  5 Past Week at Unknown time  . carvedilol (COREG) 3.125 MG tablet Take 1 tablet (3.125 mg total) by mouth 2 (two) times daily with a meal. 60 tablet 6 03/03/2017 at 0800  . celecoxib (CELEBREX) 200 MG capsule Take 200 mg by mouth daily. For arthritis   03/03/2017 at Unknown time  . Cholecalciferol (VITAMIN D3) 3000 UNITS TABS Take 1 capsule by mouth daily.   unknown  . clomiPRAMINE (ANAFRANIL) 25 MG capsule Take 25 mg by mouth 4 (four) times daily.    03/03/2017 at Unknown time  . clonazePAM (KLONOPIN) 1 MG tablet Take 1 mg by mouth at bedtime.    03/03/2017 at Unknown time  . Coenzyme Q10 (CO Q10) 200 MG CAPS Take 2 capsules by mouth daily.    unknown  . DULoxetine (CYMBALTA) 60 MG capsule Take 60 mg by mouth daily.     03/03/2017 at Unknown time  . ferrous sulfate 325 (65 FE) MG tablet Take 325 mg by mouth daily with breakfast.   03/03/2017 at Unknown time  . furosemide (LASIX) 40 MG tablet Take 20 mg by mouth 2 (two) times daily.   5 03/03/2017 at Unknown time  . Hyaluronic Acid-Vitamin C (HYALURONIC ACID PO) Take 100 mg by mouth daily.   unknown  . insulin glargine (LANTUS) 100 unit/mL SOPN  Inject 24 Units into the skin at bedtime.    03/03/2017 at Unknown time  . magnesium oxide (MAG-OX) 400 MG tablet Take 400 mg by mouth daily.   unknown  . metFORMIN (GLUCOPHAGE) 500 MG tablet Take 500 mg by mouth 2 (two) times daily with a meal.     03/03/2017 at Unknown time  . mirtazapine (REMERON) 15 MG tablet Take 15 mg by mouth at bedtime.     03/03/2017 at Unknown time  . potassium chloride (K-DUR) 10 MEQ tablet Take 1 tablet (10 mEq total) by mouth daily. 90 tablet 3 03/03/2017 at Unknown time  . Probiotic Product (PROBIOTIC ADVANCED PO) Take 1 capsule by mouth daily.   unknown  . QUEtiapine (SEROQUEL) 100 MG tablet Take 100 mg by mouth at  bedtime.   03/03/2017 at Unknown time  . warfarin (COUMADIN) 5 MG tablet Take 2.5-5 mg by mouth every evening. Starting 10/29/2016 take 2.5 mg alternating with 5 mg daily    03/03/2017   Assessment: INR at goal. No bleeding reported. Coumadin 2.5mg  today. Vanc trough on 1/31 was in goal range @ 12 mcg/ml  SrCr stable  Goal of Therapy:  INR 2-3  Vanc trough 10-15 mmcg/ml Plan:  Warfarin 2.5mg  PO x 1 today Daily PT / INR Monitor for signs and symptoms of bleeding.  Continue vancomycin 1250 mg IV q24 hours ?LOT planned for vanc?  Thanks for allowing pharmacy to be a part of this patient's care.  Talbert Cage, PharmD Clinical Pharmacist  03/09/2017 9:18 AM

## 2017-03-09 NOTE — Progress Notes (Signed)
INR 2.47 on vanco & coumadin  JETAUN STIRN HOZ:[id number] DOB: 07-03-1940 DOA: 03/03/2017 PCP: [name], MD   Physical Exam: Blood pressure (!) 126/91, pulse 90, temperature 97.8 F (36.6 C), temperature source Oral, resp. rate 20, height 5\' 8"  (1.727 m), weight 101.9 kg (224 lb 10.4 oz), SpO2 93 %.Lungs - claear . Heart - reg rhythm no s3 s4 , Extrem - lessened rubor    Investigations:  Recent Results (from the past 240 hour(s))  Culture, blood (routine x 2)     Status: None   Collection Time: 03/03/17 11:00 AM  Result Value Ref Range Status   Specimen Description RIGHT ANTECUBITAL  Final   Special Requests   Final    BOTTLES DRAWN AEROBIC AND ANAEROBIC Blood Culture adequate volume   Culture   Final    NO GROWTH 5 DAYS Performed at [facility], [address], [place], [place] [address]    Report Status 03/08/2017 FINAL  Final  Culture, blood (routine x 2)     Status: None   Collection Time: 03/03/17 11:06 AM  Result Value Ref Range Status   Specimen Description LEFT ANTECUBITAL  Final   Special Requests   Final    BOTTLES DRAWN AEROBIC AND ANAEROBIC Blood Culture adequate volume   Culture   Final    NO GROWTH 5 DAYS Performed at [facility], [address] [place], [place] [address]    Report Status 03/08/2017 FINAL  Final     Basic Metabolic Panel: Recent Labs    03/07/17 0551 03/09/17 0639  NA 135 138  K 4.3 4.3  CL 98* 99*  CO2 27 27  GLUCOSE 134* 140*  BUN 28* 25*  CREATININE 1.10* 1.07*  CALCIUM 8.7* 9.2   Liver Function Tests: No results for input(s): AST, ALT, ALKPHOS, BILITOT, PROT, ALBUMIN in the last 72 hours.   CBC: No results for input(s): WBC, NEUTROABS, HGB, HCT, MCV, PLT in the last 72 hours.  No results found.    Medications:   Impression:  Active Problems:   Essential hypertension   Chronic diastolic CHF (congestive heart failure) (HCC)   Chronic anticoagulation-Coumadin   Cellulitis   Type 2 diabetes mellitus with  other specified complication (HCC)   CKD (chronic kidney disease), stage III (HCC)     Plan:physical therapy eval and treat . Continue IV Vanc  Consultants: Pharmacy    Procedures   Antibiotics: Vancomycin          Time spent:30 minutes   LOS: 6 days   Evertte Sones M   03/09/2017, 10:09 AM

## 2017-03-10 LAB — BASIC METABOLIC PANEL
Anion gap: 10 (ref 5–15)
BUN: 28 mg/dL — ABNORMAL HIGH (ref 6–20)
CALCIUM: 9 mg/dL (ref 8.9–10.3)
CO2: 29 mmol/L (ref 22–32)
CREATININE: 1.29 mg/dL — AB (ref 0.44–1.00)
Chloride: 97 mmol/L — ABNORMAL LOW (ref 101–111)
GFR, EST AFRICAN AMERICAN: 45 mL/min — AB (ref >60)
GFR, EST NON AFRICAN AMERICAN: 39 mL/min — AB (ref >60)
GLUCOSE: 129 mg/dL — AB (ref 65–99)
Potassium: 4.4 mmol/L (ref 3.5–5.1)
Sodium: 136 mmol/L (ref 135–145)

## 2017-03-10 LAB — VANCOMYCIN, TROUGH: Vancomycin Tr: 18 ug/mL (ref 15–20)

## 2017-03-10 LAB — GLUCOSE, CAPILLARY
GLUCOSE-CAPILLARY: 142 mg/dL — AB (ref 65–99)
Glucose-Capillary: 242 mg/dL — ABNORMAL HIGH (ref 65–99)
Glucose-Capillary: 143 mg/dL — ABNORMAL HIGH (ref 65–99)
Glucose-Capillary: 232 mg/dL — ABNORMAL HIGH (ref 65–99)

## 2017-03-10 LAB — PROTIME-INR
INR: 2.3
PROTHROMBIN TIME: 25.1 s — AB (ref 11.4–15.2)

## 2017-03-10 MED ORDER — WARFARIN SODIUM 5 MG PO TABS
5.0000 mg | ORAL_TABLET | Freq: Once | ORAL | Status: AC
  Administered 2017-03-10: 5 mg via ORAL
  Filled 2017-03-10: qty 1

## 2017-03-10 MED ORDER — VANCOMYCIN HCL IN DEXTROSE 1-5 GM/200ML-% IV SOLN
1000.0000 mg | INTRAVENOUS | Status: DC
  Administered 2017-03-10 – 2017-03-11 (×2): 1000 mg via INTRAVENOUS
  Filled 2017-03-10 (×2): qty 200

## 2017-03-10 MED ORDER — FLUCONAZOLE 150 MG PO TABS
150.0000 mg | ORAL_TABLET | Freq: Every day | ORAL | Status: AC
  Administered 2017-03-10 – 2017-03-11 (×2): 150 mg via ORAL
  Filled 2017-03-10 (×2): qty 1

## 2017-03-10 NOTE — Progress Notes (Signed)
Pharmacy Antibiotic Note  Rebecca [name] is a 77 y.o. female admitted on 03/03/2017 with cellulitis.  Pharmacy has been consulted for Vancomycin dosing.  Trough 2/4 is 18: above goal of 10-15  Plan: Decrease Vancomycin to 1000 mg IV every 24 hours.  Goal trough 10-15 mcg/mL.  Monitor labs, c/s, and vanco trough as needed  Height: 5\' 8"  (172.7 cm) Weight: 224 lb 10.4 oz (101.9 kg) IBW/kg (Calculated) : 63.9  Temp (24hrs), Avg:98.1 F (36.7 C), Min:98 F (36.7 C), Max:98.1 F (36.7 C)  Recent Labs  Lab 03/04/17 0430 03/05/17 0520 03/06/17 1441 03/07/17 0551 03/09/17 0639 03/10/17 0628 03/10/17 1529  WBC 14.8*  --   --   --   --   --   --   CREATININE 1.06* 1.04*  --  1.10* 1.07* 1.29*  --   VANCOTROUGH  --   --  12*  --   --   --  18    Estimated Creatinine Clearance: 45.6 mL/min (A) (by C-G formula based on SCr of 1.29 mg/dL (H)).    Allergies  Allergen Reactions  . Codeine Anxiety  . Compazine Anaphylaxis  . Other Anaphylaxis, Rash and Other (See Comments)    Uncoded Allergy. Allergen: INOVAR Uncoded Allergy. Allergen: ALL ADHESIVES Uncoded Allergy. Allergen: SILK SUTURE Uncoded Allergy. Allergen: merthiolate Thermasol  . Penicillins Shortness Of Breath and Rash    Has patient had a PCN reaction causing immediate rash, facial/tongue/throat swelling, SOB or lightheadedness with hypotension: Yes Has patient had a PCN reaction causing severe rash involving mucus membranes or skin necrosis: No Has patient had a PCN reaction that required hospitalization No Has patient had a PCN reaction occurring within the last 10 years: Yes If all of the above answers are "NO", then may proceed with Cephalosporin use.   Marland Kitchen Morphine And Related Other (See Comments)    Patient states that it makes her lose her state of mind.   . Demerol [Meperidine] Rash   Antimicrobials this admission: 1/28 Vanco >>     Dose adjustments this admission: Obesity/Normalized CrCl Vancomycin dosing  protocol will be initiated with an estimated normalized CrCl = 45 ml/min.   Microbiology results: 1/28 BCx: NGTD final  Thank you for allowing pharmacy to be a part of this patient's care.  Tad Moore, [facility] 03/10/2017 4:26 PM

## 2017-03-10 NOTE — Progress Notes (Signed)
ANTICOAGULATION CONSULT NOTE - Follow Up Consult  Pharmacy Consult for Warfarin Indication: DVT  Allergies  Allergen Reactions  . Codeine Anxiety  . Compazine Anaphylaxis  . Other Anaphylaxis, Rash and Other (See Comments)    Uncoded Allergy. Allergen: INOVAR Uncoded Allergy. Allergen: ALL ADHESIVES Uncoded Allergy. Allergen: SILK SUTURE Uncoded Allergy. Allergen: merthiolate Thermasol  . Penicillins Shortness Of Breath and Rash    Has patient had a PCN reaction causing immediate rash, facial/tongue/throat swelling, SOB or lightheadedness with hypotension: Yes Has patient had a PCN reaction causing severe rash involving mucus membranes or skin necrosis: No Has patient had a PCN reaction that required hospitalization No Has patient had a PCN reaction occurring within the last 10 years: Yes If all of the above answers are "NO", then may proceed with Cephalosporin use.   Marland Kitchen Morphine And Related Other (See Comments)    Patient states that it makes her lose her state of mind.   . Demerol [Meperidine] Rash    Patient Measurements: Height: 5\' 8"  (172.7 cm) Weight: 224 lb 10.4 oz (101.9 kg) IBW/kg (Calculated) : 63.9   Vital Signs: Temp: 98.1 F (36.7 C) (02/04 0445) Temp Source: Oral (02/04 0445) BP: 111/37 (02/04 0445) Pulse Rate: 90 (02/04 0445)  Labs: Recent Labs    03/08/17 0703 03/09/17 0639 03/10/17 0628  LABPROT 26.8* 26.6* 25.1*  INR 2.50 2.47 2.30  CREATININE  --  1.07* 1.29*    Estimated Creatinine Clearance: 45.6 mL/min (A) (by C-G formula based on SCr of 1.29 mg/dL (H)).   Medications:  Medications Prior to Admission  Medication Sig Dispense Refill Last Dose  . BYDUREON 2 MG PEN Inject 2 mg into the skin once a week.  5 Past Week at Unknown time  . carvedilol (COREG) 3.125 MG tablet Take 1 tablet (3.125 mg total) by mouth 2 (two) times daily with a meal. 60 tablet 6 03/03/2017 at 0800  . celecoxib (CELEBREX) 200 MG capsule Take 200 mg by mouth daily. For  arthritis   03/03/2017 at Unknown time  . Cholecalciferol (VITAMIN D3) 3000 UNITS TABS Take 1 capsule by mouth daily.   unknown  . clomiPRAMINE (ANAFRANIL) 25 MG capsule Take 25 mg by mouth 4 (four) times daily.    03/03/2017 at Unknown time  . clonazePAM (KLONOPIN) 1 MG tablet Take 1 mg by mouth at bedtime.    03/03/2017 at Unknown time  . Coenzyme Q10 (CO Q10) 200 MG CAPS Take 2 capsules by mouth daily.    unknown  . DULoxetine (CYMBALTA) 60 MG capsule Take 60 mg by mouth daily.     03/03/2017 at Unknown time  . ferrous sulfate 325 (65 FE) MG tablet Take 325 mg by mouth daily with breakfast.   03/03/2017 at Unknown time  . furosemide (LASIX) 40 MG tablet Take 20 mg by mouth 2 (two) times daily.   5 03/03/2017 at Unknown time  . Hyaluronic Acid-Vitamin C (HYALURONIC ACID PO) Take 100 mg by mouth daily.   unknown  . insulin glargine (LANTUS) 100 unit/mL SOPN Inject 24 Units into the skin at bedtime.    03/03/2017 at Unknown time  . magnesium oxide (MAG-OX) 400 MG tablet Take 400 mg by mouth daily.   unknown  . metFORMIN (GLUCOPHAGE) 500 MG tablet Take 500 mg by mouth 2 (two) times daily with a meal.     03/03/2017 at Unknown time  . mirtazapine (REMERON) 15 MG tablet Take 15 mg by mouth at bedtime.     03/03/2017  at Unknown time  . potassium chloride (K-DUR) 10 MEQ tablet Take 1 tablet (10 mEq total) by mouth daily. 90 tablet 3 03/03/2017 at Unknown time  . Probiotic Product (PROBIOTIC ADVANCED PO) Take 1 capsule by mouth daily.   unknown  . QUEtiapine (SEROQUEL) 100 MG tablet Take 100 mg by mouth at bedtime.   03/03/2017 at Unknown time  . warfarin (COUMADIN) 5 MG tablet Take 2.5-5 mg by mouth every evening. Starting 10/29/2016 take 2.5 mg alternating with 5 mg daily    03/03/2017    Assessment: INR therapeutic at 2.3. No bleeding reported  Goal of Therapy:  INR 2-3 Monitor platelets by anticoagulation protocol: Yes   Plan:  Warfarin 5 mg x 1 dose Daily PT/INR Monitor s/s of bleeding  Judeth Cornfield, PharmD Clinical Pharmacist 03/10/2017 10:51 AM

## 2017-03-10 NOTE — Progress Notes (Signed)
Creat 1.29 on vancomycin and coumadin  JAMEEKA DEEMS LKJ:[id number] DOB: 1940-08-10 DOA: 03/03/2017 PCP: [name], MD   Physical Exam: Blood pressure (!) 111/37, pulse 90, temperature 98.1 F (36.7 C), temperature source Oral, resp. rate (!) 21, height 5\' 8"  (1.727 m), weight 101.9 kg (224 lb 10.4 oz), SpO2 96 %.lungs decreased BS bases ownl , heart reg rhythm no s3 s4 . Less rubor of leg    Investigations:  Recent Results (from the past 240 hour(s))  Culture, blood (routine x 2)     Status: None   Collection Time: 03/03/17 11:00 AM  Result Value Ref Range Status   Specimen Description RIGHT ANTECUBITAL  Final   Special Requests   Final    BOTTLES DRAWN AEROBIC AND ANAEROBIC Blood Culture adequate volume   Culture   Final    NO GROWTH 5 DAYS Performed at [facility], [address] [place], [place] [address]    Report Status 03/08/2017 FINAL  Final  Culture, blood (routine x 2)     Status: None   Collection Time: 03/03/17 11:06 AM  Result Value Ref Range Status   Specimen Description LEFT ANTECUBITAL  Final   Special Requests   Final    BOTTLES DRAWN AEROBIC AND ANAEROBIC Blood Culture adequate volume   Culture   Final    NO GROWTH 5 DAYS Performed at [facility], [address], [place], [place] [address]    Report Status 03/08/2017 FINAL  Final     Basic Metabolic Panel: Recent Labs    03/09/17 0639 03/10/17 0628  NA 138 136  K 4.3 4.4  CL 99* 97*  CO2 27 29  GLUCOSE 140* 129*  BUN 25* 28*  CREATININE 1.07* 1.29*  CALCIUM 9.2 9.0   Liver Function Tests: No results for input(s): AST, ALT, ALKPHOS, BILITOT, PROT, ALBUMIN in the last 72 hours.   CBC: No results for input(s): WBC, NEUTROABS, HGB, HCT, MCV, PLT in the last 72 hours.  No results found.    Medications:   Impression:  Active Problems:   Essential hypertension   Chronic diastolic CHF (congestive heart failure) (HCC)   Chronic anticoagulation-Coumadin   Cellulitis   Type 2  diabetes mellitus with other specified complication (HCC)   CKD (chronic kidney disease), stage III (HCC)     Plan:continue anticoagulation , vancomycin physical therapy and OOB   Consultants: Pharmacy     Procedures   Antibiotics: Vancomycin            Time spent:30 min.   LOS: 7 days   Carollee Nussbaumer M   03/10/2017, 1:20 PM

## 2017-03-11 LAB — PROTIME-INR
INR: 2.32
Prothrombin Time: 25.3 seconds — ABNORMAL HIGH (ref 11.4–15.2)

## 2017-03-11 LAB — CBC
HEMATOCRIT: 39.8 % (ref 36.0–46.0)
Hemoglobin: 12.7 g/dL (ref 12.0–15.0)
MCH: 28.4 pg (ref 26.0–34.0)
MCHC: 31.9 g/dL (ref 30.0–36.0)
MCV: 89 fL (ref 78.0–100.0)
Platelets: 322 10*3/uL (ref 150–400)
RBC: 4.47 MIL/uL (ref 3.87–5.11)
RDW: 13.6 % (ref 11.5–15.5)
WBC: 7.7 10*3/uL (ref 4.0–10.5)

## 2017-03-11 LAB — BASIC METABOLIC PANEL
Anion gap: 10 (ref 5–15)
BUN: 28 mg/dL — ABNORMAL HIGH (ref 6–20)
CHLORIDE: 98 mmol/L — AB (ref 101–111)
CO2: 29 mmol/L (ref 22–32)
CREATININE: 1.14 mg/dL — AB (ref 0.44–1.00)
Calcium: 9.1 mg/dL (ref 8.9–10.3)
GFR calc non Af Amer: 45 mL/min — ABNORMAL LOW (ref >60)
GFR, EST AFRICAN AMERICAN: 52 mL/min — AB (ref >60)
Glucose, Bld: 135 mg/dL — ABNORMAL HIGH (ref 65–99)
Potassium: 3.9 mmol/L (ref 3.5–5.1)
Sodium: 137 mmol/L (ref 135–145)

## 2017-03-11 LAB — GLUCOSE, CAPILLARY
GLUCOSE-CAPILLARY: 174 mg/dL — AB (ref 65–99)
Glucose-Capillary: 104 mg/dL — ABNORMAL HIGH (ref 65–99)
Glucose-Capillary: 235 mg/dL — ABNORMAL HIGH (ref 65–99)
Glucose-Capillary: 133 mg/dL — ABNORMAL HIGH (ref 65–99)
Glucose-Capillary: 180 mg/dL — ABNORMAL HIGH (ref 65–99)

## 2017-03-11 MED ORDER — WARFARIN SODIUM 2.5 MG PO TABS
2.5000 mg | ORAL_TABLET | Freq: Once | ORAL | Status: AC
  Administered 2017-03-11: 2.5 mg via ORAL
  Filled 2017-03-11: qty 1

## 2017-03-11 NOTE — Progress Notes (Signed)
Creat 1.14. INR therapeutic on Vanco coumadin  Rebecca [name]:[id number] DOB: 1940-12-20 DOA: 03/03/2017 PCP: [name], MD   Physical Exam: Blood pressure 111/64, pulse 78, temperature 98.1 F (36.7 C), temperature source Oral, resp. rate 20, height 5\' 8"  (1.727 m), weight 101.9 kg (224 lb 10.4 oz), SpO2 94 %.LUNGS - DECREASED bs BASES , NO RALES WHEEZE OR RONCHI    Investigations:  Recent Results (from the past 240 hour(s))  Culture, blood (routine x 2)     Status: None   Collection Time: 03/03/17 11:00 AM  Result Value Ref Range Status   Specimen Description RIGHT ANTECUBITAL  Final   Special Requests   Final    BOTTLES DRAWN AEROBIC AND ANAEROBIC Blood Culture adequate volume   Culture   Final    NO GROWTH 5 DAYS Performed at [facility], [address], [place], [place] [address]    Report Status 03/08/2017 FINAL  Final  Culture, blood (routine x 2)     Status: None   Collection Time: 03/03/17 11:06 AM  Result Value Ref Range Status   Specimen Description LEFT ANTECUBITAL  Final   Special Requests   Final    BOTTLES DRAWN AEROBIC AND ANAEROBIC Blood Culture adequate volume   Culture   Final    NO GROWTH 5 DAYS Performed at [facility], [address] [place], [place] [address]    Report Status 03/08/2017 FINAL  Final     Basic Metabolic Panel: Recent Labs    03/10/17 0628 03/11/17 0607  NA 136 137  K 4.4 3.9  CL 97* 98*  CO2 29 29  GLUCOSE 129* 135*  BUN 28* 28*  CREATININE 1.29* 1.14*  CALCIUM 9.0 9.1   Liver Function Tests: No results for input(s): AST, ALT, ALKPHOS, BILITOT, PROT, ALBUMIN in the last 72 hours.   CBC: Recent Labs    03/11/17 0607  WBC 7.7  HGB 12.7  HCT 39.8  MCV 89.0  PLT 322    No results found.    Medications:   Impression:  Active Problems:   Essential hypertension   Chronic diastolic CHF (congestive heart failure) (HCC)   Chronic anticoagulation-Coumadin   Cellulitis   Type 2 diabetes mellitus with  other specified complication (HCC)   CKD (chronic kidney disease), stage III (HCC)     Plan:Ambulate with assistance , Home health for continuation of Vanc and monitor of renal functioooooooon home physical therapy    Consultants:    Procedures   Antibiotics: VaANC            Time spent:30 MIN   LOS: 8 days   Tyniah Kastens M   03/11/2017, 7:36 AM

## 2017-03-11 NOTE — Plan of Care (Signed)
Pt resting in bed with eyes closed. NSD. Will continue to monitor. 

## 2017-03-11 NOTE — Care Management Important Message (Signed)
Important Message  Patient Details  Name: Rebecca [name] MRN: [id number] Date of Birth: 27-Jul-1940   Medicare Important Message Given:  Yes    Malcolm Metro, RN 03/11/2017, 2:49 PM

## 2017-03-11 NOTE — Care Management Note (Signed)
Case Management Note  Patient Details  Name: Rebecca [name] MRN: [id number] Date of Birth: 08-15-1940  Subjective/Objective:    Admitted with cellulitis. Pt is form home, lives with son, granddaughter helps. Pt has WC, Walker and cane to uses at home. Her son works during the day. He manages her medications, takes her to appointments and cooks. Pt has been recommended for SNF. Pt refusing SNF at this time. She says she did not get her sleep meds until late last night and will do better once they wear off. Pt addiment she is going home, agreeable to [facility] PT. Pt has used [facility] in the past and would lke them again. Aware HH has 48 hrs to make first visit.                 Action/Plan: DC home tomorrow with [facility] PT through [facility]. Therisa Doyne, [facility] rep, aware of referral, that DC is planned for tomorrow and will pull pt info from chart.   Expected Discharge Date:      03/12/2017            Expected Discharge Plan:  Home w Home Health Services  In-House Referral:  NA  Discharge planning Services  CM Consult  Post Acute Care Choice:  Home Health Choice offered to:  Patient  HH Arranged:  PT HH Agency:  [facility]  Status of Service:  Completed, signed off  If discussed at Long Length of Stay Meetings, dates discussed: 03/11/17   Additional Comments:  Malcolm Metro, RN 03/11/2017, 2:50 PM

## 2017-03-11 NOTE — Progress Notes (Signed)
ANTICOAGULATION CONSULT NOTE - Follow Up Consult  Pharmacy Consult for Warfarin Indication: DVT  Allergies  Allergen Reactions  . Codeine Anxiety  . Compazine Anaphylaxis  . Other Anaphylaxis, Rash and Other (See Comments)    Uncoded Allergy. Allergen: INOVAR Uncoded Allergy. Allergen: ALL ADHESIVES Uncoded Allergy. Allergen: SILK SUTURE Uncoded Allergy. Allergen: merthiolate Thermasol  . Penicillins Shortness Of Breath and Rash    Has patient had a PCN reaction causing immediate rash, facial/tongue/throat swelling, SOB or lightheadedness with hypotension: Yes Has patient had a PCN reaction causing severe rash involving mucus membranes or skin necrosis: No Has patient had a PCN reaction that required hospitalization No Has patient had a PCN reaction occurring within the last 10 years: Yes If all of the above answers are "NO", then may proceed with Cephalosporin use.   Marland Kitchen Morphine And Related Other (See Comments)    Patient states that it makes her lose her state of mind.   . Demerol [Meperidine] Rash   Patient Measurements: Height: 5\' 8"  (172.7 cm) Weight: 224 lb 10.4 oz (101.9 kg) IBW/kg (Calculated) : 63.9  Vital Signs: Temp: 98.1 F (36.7 C) (02/05 0636) Temp Source: Oral (02/05 0636) BP: 111/64 (02/05 0636) Pulse Rate: 78 (02/05 0636)  Labs: Recent Labs    03/09/17 0639 03/10/17 0628 03/11/17 0607  HGB  --   --  12.7  HCT  --   --  39.8  PLT  --   --  322  LABPROT 26.6* 25.1* 25.3*  INR 2.47 2.30 2.32  CREATININE 1.07* 1.29* 1.14*   Estimated Creatinine Clearance: 51.6 mL/min (A) (by C-G formula based on SCr of 1.14 mg/dL (H)).  Medications:  Medications Prior to Admission  Medication Sig Dispense Refill Last Dose  . BYDUREON 2 MG PEN Inject 2 mg into the skin once a week.  5 Past Week at Unknown time  . carvedilol (COREG) 3.125 MG tablet Take 1 tablet (3.125 mg total) by mouth 2 (two) times daily with a meal. 60 tablet 6 03/03/2017 at 0800  . celecoxib  (CELEBREX) 200 MG capsule Take 200 mg by mouth daily. For arthritis   03/03/2017 at Unknown time  . Cholecalciferol (VITAMIN D3) 3000 UNITS TABS Take 1 capsule by mouth daily.   unknown  . clomiPRAMINE (ANAFRANIL) 25 MG capsule Take 25 mg by mouth 4 (four) times daily.    03/03/2017 at Unknown time  . clonazePAM (KLONOPIN) 1 MG tablet Take 1 mg by mouth at bedtime.    03/03/2017 at Unknown time  . Coenzyme Q10 (CO Q10) 200 MG CAPS Take 2 capsules by mouth daily.    unknown  . DULoxetine (CYMBALTA) 60 MG capsule Take 60 mg by mouth daily.     03/03/2017 at Unknown time  . ferrous sulfate 325 (65 FE) MG tablet Take 325 mg by mouth daily with breakfast.   03/03/2017 at Unknown time  . furosemide (LASIX) 40 MG tablet Take 20 mg by mouth 2 (two) times daily.   5 03/03/2017 at Unknown time  . Hyaluronic Acid-Vitamin C (HYALURONIC ACID PO) Take 100 mg by mouth daily.   unknown  . insulin glargine (LANTUS) 100 unit/mL SOPN Inject 24 Units into the skin at bedtime.    03/03/2017 at Unknown time  . magnesium oxide (MAG-OX) 400 MG tablet Take 400 mg by mouth daily.   unknown  . metFORMIN (GLUCOPHAGE) 500 MG tablet Take 500 mg by mouth 2 (two) times daily with a meal.     03/03/2017 at  Unknown time  . mirtazapine (REMERON) 15 MG tablet Take 15 mg by mouth at bedtime.     03/03/2017 at Unknown time  . potassium chloride (K-DUR) 10 MEQ tablet Take 1 tablet (10 mEq total) by mouth daily. 90 tablet 3 03/03/2017 at Unknown time  . Probiotic Product (PROBIOTIC ADVANCED PO) Take 1 capsule by mouth daily.   unknown  . QUEtiapine (SEROQUEL) 100 MG tablet Take 100 mg by mouth at bedtime.   03/03/2017 at Unknown time  . warfarin (COUMADIN) 5 MG tablet Take 2.5-5 mg by mouth every evening. Starting 10/29/2016 take 2.5 mg alternating with 5 mg daily    03/03/2017   Assessment: INR therapeutic at 2.3. No bleeding reported  Goal of Therapy:  INR 2-3 Monitor platelets by anticoagulation protocol: Yes   Plan:  Warfarin 2.5 mg x 1  dose Daily PT/INR Monitor s/s of bleeding  [name], PharmD Clinical Pharmacist Pager:  [phone] 03/11/2017   03/11/2017 10:51 AM

## 2017-03-11 NOTE — Evaluation (Signed)
Physical Therapy Evaluation Patient Details Name: Rebecca [name] MRN: [id number] DOB: [birth year] Today's Date: 03/11/2017   History of Present Illness  Rebecca [name] is a 77yo white female who comes to [facility] on 1/28 with progressive leg pain, pt found to have LLE cellulitis. 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 At baseline pt reports she is essentially homebound without clear reason, although she says her son is concerned about her falling were she to go out. She uses a RW in the home. History significant for falls but none in the past 3 months.   Clinical Impression  Pt admitted with above diagnosis. Pt currently with functional limitations due to the deficits listed below (see "PT Problem List"). Upon entry, the patient is received semirecumbent in bed, no family/caregiver present. The pt is awake and agreeable to participate. The pt is alert and oriented x3, pleasant, conversational, and following simple and multi-step commands consistently. Functional mobility assessment demonstrates heavy strength impairment in trunk and BLE, the pt now requiring maximal effort for transfers, and AMB is limited to less than 33f @ 0.034m, whereas the patient performed these at a higher level of independence PTA. PT gives education explaining the need for rehabilitation to decrease her falls risk and assist in rapid return to PLOF, explains options at DC, STR which elevates patients speaking volume in strong opposition, HHPT which is met with verbal expression of disinterest, and OPPT which patient reports is logistically difficult: ultimately patient reports she will do her own PT at home by herself. PT expresses concerns over current state of patient, but pt shows limited insight into current limitations and impaired rationality in problem solving when cued. Empirically, the patient demonstrates increased risk of recurrent falls AEB gait speed <0.73m73m self-limiting behaviors d/t fear of falling, and strong  falls history. Pt will benefit from skilled PT intervention to increase independence and safety with basic mobility in preparation for discharge to the venue listed below.       Follow Up Recommendations SNF;Supervision for mobility/OOB(Pt is not safe for return to home at this time, very high falls risk with limited mobility, but pt expresses a very strong opinion against STR stay. Pt extremely deconditioned and weak. )    Equipment Recommendations  None recommended by PT    Recommendations for Other Services       Precautions / Restrictions Precautions Precautions: Fall Precaution Comments: very unsteady and weak Restrictions Weight Bearing Restrictions: No LLE Weight Bearing: Weight bearing as tolerated      Mobility  Bed Mobility Overal bed mobility: Modified Independent;Needs Assistance Bed Mobility: Supine to Sit     Supine to sit: Modified independent (Device/Increase time)        Transfers Overall transfer level: Needs assistance Equipment used: Rolling walker (2 wheeled) Transfers: Sit to/from Stand Sit to Stand: Min guard         General transfer comment: req max effort and heavy BUE assistance, sits before fully turned x2.   Ambulation/Gait Ambulation/Gait assistance: Min guard Ambulation Distance (Feet): 20 Feet Assistive device: Rolling walker (2 wheeled)(unsafe use, too far forward)   Gait velocity: 0.027m42mnearly 2 minutes to cover 10 feet from door to bed.) Gait velocity interpretation: <1.8 ft/sec, indicative of risk for recurrent falls    Stairs            Wheelchair Mobility    Modified Rankin (Stroke Patients Only)       Balance Overall balance assessment: Needs assistance;History of Falls  Pertinent Vitals/Pain Pain Assessment: No/denies pain(later endorses some Right knee pain during AMB, does nto rate)    Home Living Family/patient expects to be discharged  to:: Private residence Living Arrangements: Children Available Help at Discharge: Family;Available PRN/intermittently Type of Home: House Home Access: Ramped entrance     Home Layout: One level Home Equipment: Walker - 2 wheels;Wheelchair - manual;Shower seat;Grab bars - tub/shower;Grab bars - toilet;Walker - 4 wheels Additional Comments: Pt sits to shower in WIS, cannot accesss BR with WC.     Prior Function Level of Independence: Needs assistance   Gait / Transfers Assistance Needed: Ambulates with RW. Multi falls  ADL's / Homemaking Assistance Needed: no assisstance needed         Hand Dominance        Extremity/Trunk Assessment             Cervical / Trunk Assessment Cervical / Trunk Assessment: (forward flexed during AMB with RW too far in front, back legs of RW beyond her toes. )  Communication   Communication: No difficulties  Cognition Arousal/Alertness: Awake/alert Behavior During Therapy: Anxious;WFL for tasks assessed/performed                                   General Comments: Poor insight into limitations and poor formulation of planning to address these limitations when offered options.       General Comments      Exercises General Exercises - Lower Extremity Long Arc Quad: AROM;15 reps;Seated;Right   Assessment/Plan    PT Assessment Patient needs continued PT services  PT Problem List Decreased strength;Decreased knowledge of precautions;Decreased mobility;Decreased activity tolerance       PT Treatment Interventions DME instruction;Balance training;Therapeutic activities;Gait training    PT Goals (Current goals can be found in the Care Plan section)  Acute Rehab PT Goals Patient Stated Goal: return to home at all costs PT Goal Formulation: With patient Time For Goal Achievement: 03/25/17 Potential to Achieve Goals: Good    Frequency Min 3X/week   Barriers to discharge Decreased caregiver support;Inaccessible home  environment Bathrooms are not WC accessible, pt is alone during day    Co-evaluation               AM-PAC PT "6 Clicks" Daily Activity  Outcome Measure Difficulty turning over in bed (including adjusting bedclothes, sheets and blankets)?: A Little Difficulty moving from lying on back to sitting on the side of the bed? : A Little Difficulty sitting down on and standing up from a chair with arms (e.g., wheelchair, bedside commode, etc,.)?: A Lot Help needed moving to and from a bed to chair (including a wheelchair)?: A Lot Help needed walking in hospital room?: A Lot Help needed climbing 3-5 steps with a railing? : Total 6 Click Score: 13    End of Session Equipment Utilized During Treatment: Gait belt Activity Tolerance: Patient limited by fatigue;Patient limited by pain Patient left: in chair;with call bell/phone within reach Nurse Communication: Mobility status PT Visit Diagnosis: Muscle weakness (generalized) (M62.81);Difficulty in walking, not elsewhere classified (R26.2)    Time: 2482-5003 PT Time Calculation (min) (ACUTE ONLY): 26 min   Charges:   PT Evaluation $PT Eval Moderate Complexity: 1 Mod PT Treatments $Therapeutic Activity: 8-22 mins   PT G Codes:        10:11 AM, 03-21-17 Etta Grandchild, PT, DPT Physical Therapist - [place] [phone] (Office)  Belford Pascucci C 03/11/2017, 10:05 AM

## 2017-03-12 LAB — BASIC METABOLIC PANEL
Anion gap: 12 (ref 5–15)
BUN: 27 mg/dL — ABNORMAL HIGH (ref 6–20)
CALCIUM: 9.2 mg/dL (ref 8.9–10.3)
CO2: 26 mmol/L (ref 22–32)
Chloride: 100 mmol/L — ABNORMAL LOW (ref 101–111)
Creatinine, Ser: 1.08 mg/dL — ABNORMAL HIGH (ref 0.44–1.00)
GFR calc Af Amer: 56 mL/min — ABNORMAL LOW (ref >60)
GFR, EST NON AFRICAN AMERICAN: 48 mL/min — AB (ref >60)
Glucose, Bld: 129 mg/dL — ABNORMAL HIGH (ref 65–99)
POTASSIUM: 3.6 mmol/L (ref 3.5–5.1)
SODIUM: 138 mmol/L (ref 135–145)

## 2017-03-12 LAB — GLUCOSE, CAPILLARY
Glucose-Capillary: 188 mg/dL — ABNORMAL HIGH (ref 65–99)
Glucose-Capillary: 172 mg/dL — ABNORMAL HIGH (ref 65–99)

## 2017-03-12 LAB — PROTIME-INR
INR: 2.19
Prothrombin Time: 24.2 seconds — ABNORMAL HIGH (ref 11.4–15.2)

## 2017-03-12 MED ORDER — WARFARIN SODIUM 5 MG PO TABS: 5.0000 mg | ORAL_TABLET | Freq: Once | ORAL | Status: DC

## 2017-03-12 MED ORDER — CEPHALEXIN 500 MG PO CAPS
500.0000 mg | ORAL_CAPSULE | Freq: Four times a day (QID) | ORAL | Status: DC
  Administered 2017-03-12: 500 mg via ORAL
  Filled 2017-03-12: qty 1

## 2017-03-12 MED ORDER — CEPHALEXIN 500 MG PO CAPS: 500.0000 mg | ORAL_CAPSULE | Freq: Four times a day (QID) | ORAL | 0 refills | Status: DC

## 2017-03-12 NOTE — Progress Notes (Signed)
Removed IV-clean, dry, intact. Reviewed d/c paperwork with pt and granddaughter. Reviewed new meds and where to pick up. Wheeled stable pt and belongings to short stay entrance and she was picked up by granddaughter.

## 2017-03-12 NOTE — Progress Notes (Addendum)
Physical Therapy Treatment Patient Details Name: Rebecca [name] MRN: [id number] DOB: [birth year] Today's Date: 03/12/2017    History of Present Illness Rebecca [name] Name is a 76yo white female who comes to [facility] on 1/28 with progressive leg pain, pt found to have LLE cellulitis. 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 At baseline pt reports she is essentially homebound without clear reason, although she says her son is concerned about her falling were she to go out. She uses a RW in the home. History significant for falls but none in the past 3 months.     PT Comments    Patient requires less assistance today and demonstrates good return for sitting up at bedside and sit to stands with RW, slightly increased tolerance/endurance for gait and tolerated sitting up in chair after therapy.  Patient will benefit from continued physical therapy in hospital and recommended venue below to increase strength, balance, endurance for safe ADLs and gait.   Follow Up Recommendations  Home health PT;Supervision for mobility/OOB     Equipment Recommendations  None recommended by PT    Recommendations for Other Services       Precautions / Restrictions Precautions Precautions: Fall Precaution Comments: very unsteady and weak Restrictions Weight Bearing Restrictions: No LLE Weight Bearing: Weight bearing as tolerated    Mobility  Bed Mobility Overal bed mobility: Needs Assistance Bed Mobility: Supine to Sit     Supine to sit: Modified independent (Device/Increase time)        Transfers Overall transfer level: Needs assistance Equipment used: Rolling walker (2 wheeled) Transfers: Sit to/from [employer] Sit to Stand: Supervision Stand pivot transfers: Supervision          Ambulation/Gait Ambulation/Gait assistance: Supervision Ambulation Distance (Feet): 25 Feet Assistive device: Rolling walker (2 wheeled) Gait Pattern/deviations: Decreased step length -  left;Decreased stance time - right;Decreased stride length   Gait velocity interpretation: Below normal speed for age/gender General Gait Details: demonstrates slow labored cadence without loss of balance, limited secondary to c/o fatigue   Stairs            Wheelchair Mobility    Modified Rankin (Stroke Patients Only)       Balance Overall balance assessment: Needs assistance Sitting-balance support: Feet supported;No upper extremity supported Sitting balance-Leahy Scale: Good     Standing balance support: Bilateral upper extremity supported;During functional activity Standing balance-Leahy Scale: Fair                              Cognition Arousal/Alertness: Awake/alert Behavior During Therapy: WFL for tasks assessed/performed Overall Cognitive Status: Within Functional Limits for tasks assessed                                        Exercises General Exercises - Lower Extremity Ankle Circles/Pumps: Seated;AROM;Strengthening;Both;10 reps Long Arc Quad: AROM;Seated;Strengthening;Both;10 reps Hip Flexion/Marching: AROM;Seated;Strengthening;Both;10 reps    General Comments        Pertinent Vitals/Pain Pain Assessment: No/denies pain    Home Living                      Prior Function            PT Goals (current goals can now be found in the care plan section) Acute Rehab PT Goals Patient Stated Goal: return home, does not  want to go to a SNF PT Goal Formulation: With patient Time For Goal Achievement: 03/14/17 Potential to Achieve Goals: Good Progress towards PT goals: Progressing toward goals    Frequency    Min 3X/week      PT Plan Current plan remains appropriate    Co-evaluation              AM-PAC PT "6 Clicks" Daily Activity  Outcome Measure  Difficulty turning over in bed (including adjusting bedclothes, sheets and blankets)?: None Difficulty moving from lying on back to sitting on the  side of the bed? : None Difficulty sitting down on and standing up from a chair with arms (e.g., wheelchair, bedside commode, etc,.)?: None Help needed moving to and from a bed to chair (including a wheelchair)?: A Little Help needed walking in hospital room?: A Little Help needed climbing 3-5 steps with a railing? : A Lot 6 Click Score: 20    End of Session   Activity Tolerance: Patient tolerated treatment well;Patient limited by fatigue Patient left: in chair;with call bell/phone within reach;with nursing/sitter in room Nurse Communication: Mobility status PT Visit Diagnosis: Muscle weakness (generalized) (M62.81);Unsteadiness on feet (R26.81);Other abnormalities of gait and mobility (R26.89)     Time: 7116-5790 PT Time Calculation (min) (ACUTE ONLY): 28 min  Charges:  $Therapeutic Activity: 23-37 mins                    G Codes:       2:19 PM, 03-20-17 Ocie Bob, MPT Physical Therapist with [facility] [phone] office [phone] mobile phone

## 2017-03-12 NOTE — Discharge Summary (Signed)
Physician Discharge Summary  Rebecca [name] JXB:[id number] DOB: [birth year] DOA: 03/03/2017  PCP: [name], MD  Admit date: 03/03/2017 Discharge date: 03/12/2017   Recommendations for Outpatient Follow-up:  Take Keflex 500 mg. P.o. Qid for 10 days . Resume coumadin 5mg / 2.5 mg. Alternating daily . Follow up office in one week. Discharge Diagnoses:  Active Problems:   Essential hypertension   Chronic diastolic CHF (congestive heart failure) (HCC)   Chronic anticoagulation-Coumadin   Cellulitis   Type 2 diabetes mellitus with other specified complication (HCC)   CKD (chronic kidney disease), stage III [facility])   Discharge Condition: good  Filed Weights   03/03/17 1008 03/03/17 1639 03/05/17 0644  Weight: 95.3 kg (210 lb) 102.2 kg (225 lb 5 oz) 101.9 kg (224 lb 10.4 oz)    History of present illness:  36 WF with IDDM, CRF, Antiocoagulation for DVT recurrent , Htn and mild aortic stenosios [presented with LLE cellulitis . Dopplers negative for DVT. Placed on Vancomycin for 8 days with adequate resolution. No signs of septic arthritis of knee. Pt continued coumadin and had no change in renal function with VANCOMYCIN SHE WAS DISCHARGED WITH hhc / pHYSICAL THERAPY ON kEFLEX 500 MG. QID TO FLOOW UP IN OFFICE 1 WEEK.  Hospital Course:  sEE hpi ABOVE   Procedures:  venous doppler   Consultations:  Pharmacy   Discharge Instructions    Allergies  Allergen Reactions  . Codeine Anxiety  . Compazine Anaphylaxis  . Other Anaphylaxis, Rash and Other (See Comments)    Uncoded Allergy. Allergen: INOVAR Uncoded Allergy. Allergen: ALL ADHESIVES Uncoded Allergy. Allergen: SILK SUTURE Uncoded Allergy. Allergen: merthiolate Thermasol  . Penicillins Shortness Of Breath and Rash    Has patient had a PCN reaction causing immediate rash, facial/tongue/throat swelling, SOB or lightheadedness with hypotension: Yes Has patient had a PCN reaction causing severe rash involving mucus  membranes or skin necrosis: No Has patient had a PCN reaction that required hospitalization No Has patient had a PCN reaction occurring within the last 10 years: Yes If all of the above answers are "NO", then may proceed with Cephalosporin use.   Marland Kitchen Morphine And Related Other (See Comments)    Patient states that it makes her lose her state of mind.   . Demerol [Meperidine] Rash      The results of significant diagnostics from this hospitalization (including imaging, microbiology, ancillary and laboratory) are listed below for reference.    Significant Diagnostic Studies: US Venous Img Lower  Left (dvt Study)  Result Date: 03/03/2017 CLINICAL DATA:  Pain and edema x4 days; remote history of DVT, on warfarin EXAM: LEFT LOWER EXTREMITY VENOUS DOPPLER ULTRASOUND TECHNIQUE: Gray-scale sonography with compression, as well as color and duplex ultrasound, were performed to evaluate the deep venous system from the level of the common femoral vein through the popliteal and proximal calf veins. COMPARISON:  None FINDINGS: Normal compressibility of the common femoral, superficial femoral, and popliteal veins, as well as the proximal calf veins. No filling defects to suggest DVT on grayscale or color Doppler imaging. Doppler waveforms show normal direction of venous flow, normal respiratory phasicity and response to augmentation. Visualized segments of the saphenous venous system normal in caliber and compressibility. Subcutaneous edema in the left calf. Survey views of the contralateral common femoral vein are unremarkable. IMPRESSION: No evidence of LEFT lower extremity deep vein thrombosis. Electronically Signed   By: Corlis Leak M.D.   On: 03/03/2017 12:28   Dg Foot 2 Views  Left  Result Date: 03/03/2017 CLINICAL DATA:  Left foot pain.  Redness and swelling in the foot. EXAM: LEFT FOOT - 2 VIEW COMPARISON:  None. FINDINGS: Soft tissue swelling is present throughout the foot. Bone mineralization is within  normal limits. No acute osseous abnormality is present. Vascular calcifications are noted. IMPRESSION: 1. Sub diffuse soft tissue swelling without underlying osseous abnormality. This may be related to cellulitis or edema. 2. Vascular calcifications compatible with atherosclerosis. Distal small vessel calcifications are often associated with diabetes. Electronically Signed   By: Marin Roberts M.D.   On: 03/03/2017 12:09    Microbiology: Recent Results (from the past 240 hour(s))  Culture, blood (routine x 2)     Status: None   Collection Time: 03/03/17 11:00 AM  Result Value Ref Range Status   Specimen Description RIGHT ANTECUBITAL  Final   Special Requests   Final    BOTTLES DRAWN AEROBIC AND ANAEROBIC Blood Culture adequate volume   Culture   Final    NO GROWTH 5 DAYS Performed at [facility], [address] [place], [place] [address]    Report Status 03/08/2017 FINAL  Final  Culture, blood (routine x 2)     Status: None   Collection Time: 03/03/17 11:06 AM  Result Value Ref Range Status   Specimen Description LEFT ANTECUBITAL  Final   Special Requests   Final    BOTTLES DRAWN AEROBIC AND ANAEROBIC Blood Culture adequate volume   Culture   Final    NO GROWTH 5 DAYS Performed at [facility], [address], [place], [place] [address]    Report Status 03/08/2017 FINAL  Final     Labs: Basic Metabolic Panel: Recent Labs  Lab 03/07/17 0551 03/09/17 0639 03/10/17 0628 03/11/17 0607 03/12/17 0459  NA 135 138 136 137 138  K 4.3 4.3 4.4 3.9 3.6  CL 98* 99* 97* 98* 100*  CO2 27 27 29 29 26   GLUCOSE 134* 140* 129* 135* 129*  BUN 28* 25* 28* 28* 27*  CREATININE 1.10* 1.07* 1.29* 1.14* 1.08*  CALCIUM 8.7* 9.2 9.0 9.1 9.2   Liver Function Tests: No results for input(s): AST, ALT, ALKPHOS, BILITOT, PROT, ALBUMIN in the last 168 hours. No results for input(s): LIPASE, AMYLASE in the last 168 hours. No results for input(s): AMMONIA in the last 168 hours. CBC: Recent Labs   Lab 03/11/17 0607  WBC 7.7  HGB 12.7  HCT 39.8  MCV 89.0  PLT 322   Cardiac Enzymes: No results for input(s): CKTOTAL, CKMB, CKMBINDEX, TROPONINI in the last 168 hours. BNP: BNP (last 3 results) No results for input(s): BNP in the last 8760 hours.  ProBNP (last 3 results) No results for input(s): PROBNP in the last 8760 hours.  CBG: Recent Labs  Lab 03/11/17 1642 03/11/17 2024 03/11/17 2119 03/12/17 0826 03/12/17 1147  GLUCAP 133* 174* 180* 188* 172*       Signed: r Drue Camera   Quanita Barona M   Pager: [phone] 03/12/2017, 12:14 PM

## 2017-03-12 NOTE — Progress Notes (Signed)
ANTICOAGULATION CONSULT NOTE - Follow Up Consult  Pharmacy Consult for Warfarin Indication: DVT  Allergies  Allergen Reactions  . Codeine Anxiety  . Compazine Anaphylaxis  . Other Anaphylaxis, Rash and Other (See Comments)    Uncoded Allergy. Allergen: INOVAR Uncoded Allergy. Allergen: ALL ADHESIVES Uncoded Allergy. Allergen: SILK SUTURE Uncoded Allergy. Allergen: merthiolate Thermasol  . Penicillins Shortness Of Breath and Rash    Has patient had a PCN reaction causing immediate rash, facial/tongue/throat swelling, SOB or lightheadedness with hypotension: Yes Has patient had a PCN reaction causing severe rash involving mucus membranes or skin necrosis: No Has patient had a PCN reaction that required hospitalization No Has patient had a PCN reaction occurring within the last 10 years: Yes If all of the above answers are "NO", then may proceed with Cephalosporin use.   Marland Kitchen Morphine And Related Other (See Comments)    Patient states that it makes her lose her state of mind.   . Demerol [Meperidine] Rash   Patient Measurements: Height: 5\' 8"  (172.7 cm) Weight: 224 lb 10.4 oz (101.9 kg) IBW/kg (Calculated) : 63.9  Vital Signs: Temp: 97.8 F (36.6 C) (02/06 0508) Temp Source: Oral (02/06 0508) BP: 123/79 (02/06 0508) Pulse Rate: 85 (02/06 0508)  Labs: Recent Labs    03/10/17 0628 03/11/17 0607 03/12/17 0459  HGB  --  12.7  --   HCT  --  39.8  --   PLT  --  322  --   LABPROT 25.1* 25.3* 24.2*  INR 2.30 2.32 2.19  CREATININE 1.29* 1.14* 1.08*   Estimated Creatinine Clearance: 54.5 mL/min (A) (by C-G formula based on SCr of 1.08 mg/dL (H)).  Medications:  Medications Prior to Admission  Medication Sig Dispense Refill Last Dose  . BYDUREON 2 MG PEN Inject 2 mg into the skin once a week.  5 Past Week at Unknown time  . carvedilol (COREG) 3.125 MG tablet Take 1 tablet (3.125 mg total) by mouth 2 (two) times daily with a meal. 60 tablet 6 03/03/2017 at 0800  . celecoxib  (CELEBREX) 200 MG capsule Take 200 mg by mouth daily. For arthritis   03/03/2017 at Unknown time  . Cholecalciferol (VITAMIN D3) 3000 UNITS TABS Take 1 capsule by mouth daily.   unknown  . clomiPRAMINE (ANAFRANIL) 25 MG capsule Take 25 mg by mouth 4 (four) times daily.    03/03/2017 at Unknown time  . clonazePAM (KLONOPIN) 1 MG tablet Take 1 mg by mouth at bedtime.    03/03/2017 at Unknown time  . Coenzyme Q10 (CO Q10) 200 MG CAPS Take 2 capsules by mouth daily.    unknown  . DULoxetine (CYMBALTA) 60 MG capsule Take 60 mg by mouth daily.     03/03/2017 at Unknown time  . ferrous sulfate 325 (65 FE) MG tablet Take 325 mg by mouth daily with breakfast.   03/03/2017 at Unknown time  . furosemide (LASIX) 40 MG tablet Take 20 mg by mouth 2 (two) times daily.   5 03/03/2017 at Unknown time  . Hyaluronic Acid-Vitamin C (HYALURONIC ACID PO) Take 100 mg by mouth daily.   unknown  . insulin glargine (LANTUS) 100 unit/mL SOPN Inject 24 Units into the skin at bedtime.    03/03/2017 at Unknown time  . magnesium oxide (MAG-OX) 400 MG tablet Take 400 mg by mouth daily.   unknown  . metFORMIN (GLUCOPHAGE) 500 MG tablet Take 500 mg by mouth 2 (two) times daily with a meal.     03/03/2017 at  Unknown time  . mirtazapine (REMERON) 15 MG tablet Take 15 mg by mouth at bedtime.     03/03/2017 at Unknown time  . potassium chloride (K-DUR) 10 MEQ tablet Take 1 tablet (10 mEq total) by mouth daily. 90 tablet 3 03/03/2017 at Unknown time  . Probiotic Product (PROBIOTIC ADVANCED PO) Take 1 capsule by mouth daily.   unknown  . QUEtiapine (SEROQUEL) 100 MG tablet Take 100 mg by mouth at bedtime.   03/03/2017 at Unknown time  . warfarin (COUMADIN) 5 MG tablet Take 2.5-5 mg by mouth every evening. Starting 10/29/2016 take 2.5 mg alternating with 5 mg daily    03/03/2017   Assessment: INR therapeutic at 2.19. No bleeding reported  Goal of Therapy:  INR 2-3 Monitor platelets by anticoagulation protocol: Yes   Plan:  Warfarin 5 mg x 1  dose Daily PT/INR Monitor s/s of bleeding  Judeth Cornfield, PharmD Clinical Pharmacist 03/12/2017 8:10 AM

## 2017-03-25 ENCOUNTER — Emergency Department (HOSPITAL_COMMUNITY)
Admission: EM | Admit: 2017-03-25 | Discharge: 2017-03-25 | Disposition: A | Discharge status: Home / Self Care | Payer: Medicare Other | Attending: Emergency Medicine | Admitting: Emergency Medicine

## 2017-03-25 ENCOUNTER — Emergency Department (HOSPITAL_COMMUNITY): Payer: Medicare Other

## 2017-03-25 ENCOUNTER — Other Ambulatory Visit: Payer: Self-pay

## 2017-03-25 ENCOUNTER — Encounter (HOSPITAL_COMMUNITY): Payer: Self-pay | Admitting: Emergency Medicine

## 2017-03-25 DIAGNOSIS — W1839XA Other fall on same level, initial encounter: Secondary | ICD-10-CM | POA: Insufficient documentation

## 2017-03-25 DIAGNOSIS — I5032 Chronic diastolic (congestive) heart failure: Secondary | ICD-10-CM | POA: Insufficient documentation

## 2017-03-25 DIAGNOSIS — R531 Weakness: Secondary | ICD-10-CM | POA: Diagnosis not present

## 2017-03-25 DIAGNOSIS — E1165 Type 2 diabetes mellitus with hyperglycemia: Secondary | ICD-10-CM | POA: Diagnosis not present

## 2017-03-25 DIAGNOSIS — Y92018 Other place in single-family (private) house as the place of occurrence of the external cause: Secondary | ICD-10-CM | POA: Diagnosis not present

## 2017-03-25 DIAGNOSIS — N183 Chronic kidney disease, stage 3 (moderate): Secondary | ICD-10-CM | POA: Insufficient documentation

## 2017-03-25 DIAGNOSIS — Y999 Unspecified external cause status: Secondary | ICD-10-CM | POA: Diagnosis not present

## 2017-03-25 DIAGNOSIS — M25561 Pain in right knee: Secondary | ICD-10-CM | POA: Insufficient documentation

## 2017-03-25 DIAGNOSIS — R739 Hyperglycemia, unspecified: Secondary | ICD-10-CM

## 2017-03-25 DIAGNOSIS — Y9389 Activity, other specified: Secondary | ICD-10-CM | POA: Diagnosis not present

## 2017-03-25 DIAGNOSIS — I130 Hypertensive heart and chronic kidney disease with heart failure and stage 1 through stage 4 chronic kidney disease, or unspecified chronic kidney disease: Secondary | ICD-10-CM | POA: Diagnosis not present

## 2017-03-25 DIAGNOSIS — Z87891 Personal history of nicotine dependence: Secondary | ICD-10-CM | POA: Insufficient documentation

## 2017-03-25 DIAGNOSIS — G8929 Other chronic pain: Secondary | ICD-10-CM | POA: Insufficient documentation

## 2017-03-25 DIAGNOSIS — Z794 Long term (current) use of insulin: Secondary | ICD-10-CM | POA: Insufficient documentation

## 2017-03-25 DIAGNOSIS — S8991XA Unspecified injury of right lower leg, initial encounter: Secondary | ICD-10-CM | POA: Diagnosis present

## 2017-03-25 LAB — BASIC METABOLIC PANEL
Anion gap: 14 (ref 5–15)
BUN: 25 mg/dL — AB (ref 6–20)
CALCIUM: 9 mg/dL (ref 8.9–10.3)
CO2: 22 mmol/L (ref 22–32)
CREATININE: 1.22 mg/dL — AB (ref 0.44–1.00)
Chloride: 96 mmol/L — ABNORMAL LOW (ref 101–111)
GFR calc Af Amer: 48 mL/min — ABNORMAL LOW (ref >60)
GFR calc non Af Amer: 42 mL/min — ABNORMAL LOW (ref >60)
GLUCOSE: 299 mg/dL — AB (ref 65–99)
Potassium: 4 mmol/L (ref 3.5–5.1)
Sodium: 132 mmol/L — ABNORMAL LOW (ref 135–145)

## 2017-03-25 LAB — CBC WITH DIFFERENTIAL/PLATELET
Basophils Absolute: 0 10*3/uL (ref 0.0–0.1)
Basophils Relative: 1 %
EOS PCT: 2 %
Eosinophils Absolute: 0.1 10*3/uL (ref 0.0–0.7)
HEMATOCRIT: 40.4 % (ref 36.0–46.0)
Hemoglobin: 12.9 g/dL (ref 12.0–15.0)
LYMPHS PCT: 22 %
Lymphs Abs: 1.2 10*3/uL (ref 0.7–4.0)
MCH: 28.9 pg (ref 26.0–34.0)
MCHC: 31.9 g/dL (ref 30.0–36.0)
MCV: 90.4 fL (ref 78.0–100.0)
MONOS PCT: 15 %
Monocytes Absolute: 0.8 10*3/uL (ref 0.1–1.0)
NEUTROS ABS: 3.3 10*3/uL (ref 1.7–7.7)
Neutrophils Relative %: 60 %
PLATELETS: 255 10*3/uL (ref 150–400)
RBC: 4.47 MIL/uL (ref 3.87–5.11)
RDW: 14.6 % (ref 11.5–15.5)
WBC: 5.5 10*3/uL (ref 4.0–10.5)

## 2017-03-25 LAB — PROTIME-INR
INR: 2.04
Prothrombin Time: 22.9 seconds — ABNORMAL HIGH (ref 11.4–15.2)

## 2017-03-25 MED ORDER — HYDROCODONE-ACETAMINOPHEN 5-325 MG PO TABS: 1.0000 | ORAL_TABLET | ORAL | 0 refills | Status: DC | PRN

## 2017-03-25 MED ORDER — HYDROCODONE-ACETAMINOPHEN 5-325 MG PO TABS
1.0000 | ORAL_TABLET | Freq: Once | ORAL | Status: AC
  Administered 2017-03-25: 1 via ORAL
  Filled 2017-03-25: qty 1

## 2017-03-25 NOTE — ED Notes (Signed)
Pt assisted to car front seat  Hit head as she was sitting in Driver passengerseat  Family states she is alright  Home with family

## 2017-03-25 NOTE — ED Notes (Signed)
Per Camillia Herter, RN, dept director Herbert Seta, SW is calling Dr Janna Arch to infor that he can place from his office and which forms he can fill out for same  Pt is currently not being admitted from ED

## 2017-03-25 NOTE — Discharge Instructions (Signed)
As discussed,  you should be receiving a call tomorrow from our in house case management to arrange for assessment of additional assistance in your home.  In the interim, you may take the medicine prescribed to help you with knee pain and hopeful easier ambulation.  This medicine can make you sleepy so use caution with it.

## 2017-03-25 NOTE — ED Triage Notes (Signed)
Fell up step going into kitchen.  C/o rt knee pain

## 2017-03-25 NOTE — ED Notes (Signed)
Per Hazle Quant PA family is refusing to take home  So "they are discussing it"  [place], Charity fundraiser, CN informed

## 2017-03-25 NOTE — ED Notes (Signed)
Pt ambulates with a walker  Larey Seat trying to get up a step  Lives with family

## 2017-03-25 NOTE — ED Notes (Signed)
Assist to BR pt needs assist getting up from regular toilet  States she has a handicapped toilet at home  She then is given a walker and turns with walker and sits on WC to be returned to rm

## 2017-03-25 NOTE — ED Provider Notes (Signed)
[facility] EMERGENCY DEPARTMENT Provider Note   CSN: [id number] Arrival date & time: 03/25/17  1113     History   Chief Complaint Chief Complaint  Patient presents with  . Knee Injury    HPI Rebecca [name] is a 77 y.o. female with past medical history as outlined below including severe arthritis, particularly painful currently in her right knee and with recent hospitalization due to a left lower extremity cellulitis presenting for evaluation of persistent pain and weakness since her hospitalization and difficulty ambulating.  She currently lives in her son's home who helps her when he is able but also works and is concerned about her ability to take care of herself when she is there alone stating she cannot walk without assistance.  Last night she attempted to get out of bed to use the bathroom and was unable, therefore had an episode of incontinence in her bed.  While attempting to ambulate today using her walker she became weak but also endorses severe pain in her right knee causing her to fall, although the granddaughter who was assisting caught her and she denies any injury from this fall.  The family is very concerned about her ability to stay in their home safely getting the care that she needs.  Home health care and physical therapy were arranged after her hospitalization (dc'd on 2/6) family states she was evaluated for this but has yet to have any PT performed.  Patient is not currently on any pain medications stating she is "afraid" of taking narcotics.  She is on Coumadin therefore NSAIDs are not an option.  Other medications tried have been topical muscle rubs and lidocaine patches which have not been helpful.  The history is provided by the patient and a relative.    Past Medical History:  Diagnosis Date  . Aortic stenosis   . Cataplexy   . Diastolic heart failure (HCC)   . Essential hypertension   . GERD (gastroesophageal reflux disease)   . Hiatal hernia   . History of  recurrent deep vein thrombosis (DVT)   . Mitral stenosis   . Narcolepsy   . Neuropathy   . Type 2 diabetes mellitus [facility])     Patient Active Problem List   Diagnosis Date Noted  . Cellulitis 03/03/2017  . Type 2 diabetes mellitus with other specified complication (HCC) 03/03/2017  . CKD (chronic kidney disease), stage III (HCC) 03/03/2017  . Status post placement of cardiac pacemaker 10/29/2016  . Pulmonary edema 10/23/2016  . CAP (community acquired pneumonia) 02/22/2015  . Fall at home 09/21/2014  . Mild aortic stenosis 09/08/2014  . Mild to moderate  mitral stenosis 09/08/2014  . Chronic anticoagulation-Coumadin 09/08/2014  . Sepsis (HCC) 09/05/2014  . Lower leg DVT (deep venous thromboembolism), chronic (HCC) 09/05/2014  . Essential hypertension 09/05/2014  . Depression 09/05/2014  . Poorly controlled diabetes mellitus (HCC) 09/05/2014  . Hypomagnesemia 09/05/2014  . Hyponatremia 09/05/2014  . Pyelonephritis 09/05/2014  . Chronic diastolic CHF (congestive heart failure) (HCC)   . 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 GERD (gastroesophageal reflux disease) 07/08/2011  . Gastroparesis 07/08/2011  . IDA (iron deficiency anemia) 07/08/2011  . History of colonic polyps 07/08/2011  . Narcolepsy-evaluated at [facility] 07/08/2011  . NAFLD (nonalcoholic fatty liver disease) 17/61/6073  . Obesity-BMI 37 07/08/2011    Past Surgical History:  Procedure Laterality Date  . ABDOMINAL HYSTERECTOMY    . CARPAL TUNNEL RELEASE    . CARPECTOMY  12/26/2011  Procedure: CARPECTOMY;  Surgeon: Wyn Forster., MD;  Location: [facility];  Service: Orthopedics;  Laterality: Right;  PROXIMAL ROW CARPECTOMY RIGHT WRIST and neurectomy  . CHOLECYSTECTOMY    . HEMORROIDECTOMY    . KIDNEY SURGERY    . NEPHROSTOMY    . PACEMAKER IMPLANT N/A 10/25/2016   Procedure: Pacemaker Implant;  Surgeon: Regan Lemming, MD;  Location: [facility] INVASIVE CV LAB;  Service:  Cardiovascular;  Laterality: N/A;  . TOTAL HIP ARTHROPLASTY     bilateral    OB History    No data available       Home Medications    Prior to Admission medications   Medication Sig Start Date End Date Taking? Authorizing Provider  BYDUREON 2 MG PEN Inject 2 mg into the skin once a week. 01/03/16  Yes [provider]  carvedilol (COREG) 3.125 MG tablet Take 1 tablet (3.125 mg total) by mouth 2 (two) times daily with a meal. 10/28/16  Yes Sheilah Pigeon, PA-C  celecoxib (CELEBREX) 200 MG capsule Take 200 mg by mouth daily. For arthritis   Yes [provider]  Cholecalciferol (VITAMIN D3) 3000 UNITS TABS Take 1 capsule by mouth daily.   Yes [provider]  clomiPRAMINE (ANAFRANIL) 25 MG capsule Take 25 mg by mouth 4 (four) times daily.    Yes [provider]  clonazePAM (KLONOPIN) 1 MG tablet Take 1 mg by mouth at bedtime.    Yes [provider]  Coenzyme Q10 (CO Q10) 200 MG CAPS Take 2 capsules by mouth daily.    Yes [provider]  DULoxetine (CYMBALTA) 60 MG capsule Take 60 mg by mouth daily.     Yes [provider]  ferrous sulfate 325 (65 FE) MG tablet Take 325 mg by mouth daily with breakfast.   Yes [provider]  furosemide (LASIX) 40 MG tablet Take 20 mg by mouth 2 (two) times daily.  12/01/14  Yes [provider]  Hyaluronic Acid-Vitamin C (HYALURONIC ACID PO) Take 100 mg by mouth daily.   Yes [provider]  Insulin Glargine (TOUJEO MAX SOLOSTAR) 300 UNIT/ML SOPN Inject 24 Units into the skin every evening.   Yes [provider]  magnesium oxide (MAG-OX) 400 MG tablet Take 400 mg by mouth daily.   Yes [provider]  metFORMIN (GLUCOPHAGE) 500 MG tablet Take 500 mg by mouth 2 (two) times daily with a meal.     Yes [provider]  mirtazapine (REMERON) 15 MG tablet Take 15 mg by mouth at bedtime.     Yes [provider]  Multiple Vitamin  (MULTIVITAMIN WITH MINERALS) TABS tablet Take 1 tablet by mouth daily.   Yes [provider]  potassium chloride (K-DUR) 10 MEQ tablet Take 1 tablet (10 mEq total) by mouth daily. 09/21/14  Yes Dyann Kief, PA-C  Probiotic Product (PROBIOTIC ADVANCED PO) Take 1 capsule by mouth daily.   Yes [provider]  QUEtiapine (SEROQUEL) 100 MG tablet Take 100 mg by mouth at bedtime.   Yes [provider]  warfarin (COUMADIN) 5 MG tablet Take 2.5-5 mg by mouth every evening. Starting 10/29/2016 take 2.5 mg alternating with 5 mg daily    Yes [provider]  cephALEXin (KEFLEX) 500 MG capsule Take 1 capsule (500 mg total) by mouth every 6 (six) hours. Patient not taking: Reported on 03/25/2017 03/12/17   [name], MD  HYDROcodone-acetaminophen (NORCO/VICODIN) 5-325 MG tablet Take 1 tablet by  mouth every 4 (four) hours as needed. 03/25/17   Burgess Amor, PA-C  insulin glargine (LANTUS) 100 unit/mL SOPN Inject 24 Units into the skin at bedtime.     [provider]    Family History Family History  Problem Relation Age of Onset  . Heart disease Father   . Diabetes Unknown   . Stroke Mother   . Scleroderma Brother     Social History Social History   Tobacco Use  . Smoking status: Former Smoker    Last attempt to quit: 10/13/1988    Years since quitting: 28.4  . Smokeless tobacco: Never Used  Substance Use Topics  . Alcohol use: No    Alcohol/week: 0.0 oz  . Drug use: No     Allergies   Codeine; Compazine; Other; Penicillins; Morphine and related; and Demerol [meperidine]   Review of Systems Review of Systems  Constitutional: Negative for fever.  HENT: Negative for congestion and sore throat.   Eyes: Negative.   Respiratory: Negative for chest tightness and shortness of breath.   Cardiovascular: Negative for chest pain.  Gastrointestinal: Negative for abdominal pain and nausea.  Genitourinary: Negative.   Musculoskeletal: Positive for  arthralgias. Negative for joint swelling and neck pain.  Skin: Negative.  Negative for rash and wound.  Neurological: Positive for weakness. Negative for dizziness, light-headedness, numbness and headaches.  Psychiatric/Behavioral: Negative.      Physical Exam Updated Vital Signs BP 117/80 (BP Location: Right Arm)   Pulse 76   Temp 98.1 F (36.7 C) (Oral)   Resp 16   SpO2 99%   Physical Exam  Constitutional: She appears well-developed and well-nourished.  HENT:  Head: Atraumatic.  Neck: Normal range of motion.  Cardiovascular:  Pulses equal bilaterally  Musculoskeletal: She exhibits tenderness.       Right knee: She exhibits effusion. She exhibits no erythema. Tenderness found.  Neurological: She is alert. She has normal strength. She displays normal reflexes. No sensory deficit.  Skin: Skin is warm and dry.  Psychiatric: She has a normal mood and affect.     ED Treatments / Results  Labs (all labs ordered are listed, but only abnormal results are displayed) Labs Reviewed  BASIC METABOLIC PANEL - Abnormal; Notable for the following components:      Result Value   Sodium 132 (*)    Chloride 96 (*)    Glucose, Bld 299 (*)    BUN 25 (*)    Creatinine, Ser 1.22 (*)    GFR calc non Af Amer 42 (*)    GFR calc Af Amer 48 (*)    All other components within normal limits  PROTIME-INR - Abnormal; Notable for the following components:   Prothrombin Time 22.9 (*)    All other components within normal limits  CBC WITH DIFFERENTIAL/PLATELET    EKG  EKG Interpretation None       Radiology Dg Knee 2 Views Right  Result Date: 03/25/2017 CLINICAL DATA:  Pain. EXAM: RIGHT KNEE - 1-2 VIEW COMPARISON:  None. FINDINGS: Small suprapatellar joint effusion. Severe lateral compartment joint space narrowing, subchondral sclerosis and marginal spur formation noted. Mild to moderate medial and patellofemoral compartment osteoarthritis also noted. Atherosclerotic vascular  calcifications identified. IMPRESSION: 1. Severe lateral compartment osteoarthritis. 2. Small joint effusion. Electronically Signed   By: Signa Kell M.D.   On: 03/25/2017 12:03    Procedures Procedures (including critical care time)  Medications Ordered in ED Medications  HYDROcodone-acetaminophen (NORCO/VICODIN) 5-325 MG per tablet 1 tablet (  1 tablet Oral Given 03/25/17 1604)     Initial Impression / Assessment and Plan / ED Course  I have reviewed the triage vital signs and the nursing notes.  Pertinent labs & imaging results that were available during my care of the patient were reviewed by me and considered in my medical decision making (see chart for details).     Pt in wheelchair during this visit. Upon wheeling to bathroom, she was able to walk a few steps with assistance, but was unable to stand up from the toilet.  Family desirous of placement with patient agreeing she cannot care for herself in current situation.  Call placed to Dr. Janna Arch to discuss. He recommended admission in anticipation of placement.    Upon discussing this plan with pt and her family, pt now refusing to go to a nursing home.  She was placed in the past at the [facility] for rehab and states "would rather die than go back".  Son, who is now dg's cell phone stating he will not take her back home if she cannot walk and take care of herself. After further discussion including Dr. Clayborne Dana, family agrees to take her home if we can assure them case management will arrange for further assistance at home.  Pt also agrees to try hydrocodone tablet to see if this will allow easier ambulation.   Nursing manager Camillia Herter involved on contacting case management for this patient who assures [place] that she will be in contact with patient in the morning. Orders placed at dc for home health assistance.  Pt was given a hydrocodone here, she was able to ambulate, slowly, with pain, but improving. She did not seem unsteady  on her feet.  Labs reviewed and discussed with pt and family. Advised frequent recheck next several days.  Family endorses she has had nothing but carbs since ytd. No acidosis present.  Final Clinical Impressions(s) / ED Diagnoses   Final diagnoses:  Chronic pain of right knee  Weakness  Hyperglycemia    ED Discharge Orders        Ordered    HYDROcodone-acetaminophen (NORCO/VICODIN) 5-325 MG tablet  Every 4 hours PRN     03/25/17 1716       Burgess Amor, PA-C 03/25/17 1724    Mesner, Barbara Cower, MD 03/26/17 1758

## 2017-03-25 NOTE — ED Notes (Signed)
Pt able to ambulate with walker after assist from toilet

## 2017-03-25 NOTE — ED Notes (Signed)
Dr Judie Petit in to discuss with pt/ family'  Per Dr Judie Petit family refuses to take pt home  Pt has home care coming tomorrow and pt states that they come only 1 day weekly  Discussion with Dr Judie Petit, Camillia Herter and Hazle Quant regarding pt  Call to DSS re family refusal to take pt home

## 2017-03-29 ENCOUNTER — Other Ambulatory Visit: Payer: Self-pay

## 2017-03-29 ENCOUNTER — Inpatient Hospital Stay (HOSPITAL_COMMUNITY)
Admission: EM | Admit: 2017-03-29 | Discharge: 2017-04-03 | DRG: 193 | Disposition: A | Discharge status: Skilled Nursing Facility | Payer: Medicare Other | Attending: Family Medicine | Admitting: Family Medicine

## 2017-03-29 ENCOUNTER — Emergency Department (HOSPITAL_COMMUNITY): Payer: Medicare Other

## 2017-03-29 DIAGNOSIS — J189 Pneumonia, unspecified organism: Secondary | ICD-10-CM

## 2017-03-29 DIAGNOSIS — Z6837 Body mass index (BMI) 37.0-37.9, adult: Secondary | ICD-10-CM

## 2017-03-29 DIAGNOSIS — Z7901 Long term (current) use of anticoagulants: Secondary | ICD-10-CM

## 2017-03-29 DIAGNOSIS — G47411 Narcolepsy with cataplexy: Secondary | ICD-10-CM | POA: Diagnosis present

## 2017-03-29 DIAGNOSIS — E1169 Type 2 diabetes mellitus with other specified complication: Secondary | ICD-10-CM

## 2017-03-29 DIAGNOSIS — F329 Major depressive disorder, single episode, unspecified: Secondary | ICD-10-CM | POA: Diagnosis present

## 2017-03-29 DIAGNOSIS — E1165 Type 2 diabetes mellitus with hyperglycemia: Secondary | ICD-10-CM | POA: Diagnosis present

## 2017-03-29 DIAGNOSIS — Z87891 Personal history of nicotine dependence: Secondary | ICD-10-CM

## 2017-03-29 DIAGNOSIS — J9601 Acute respiratory failure with hypoxia: Secondary | ICD-10-CM | POA: Diagnosis present

## 2017-03-29 DIAGNOSIS — K219 Gastro-esophageal reflux disease without esophagitis: Secondary | ICD-10-CM | POA: Diagnosis present

## 2017-03-29 DIAGNOSIS — N183 Chronic kidney disease, stage 3 unspecified: Secondary | ICD-10-CM

## 2017-03-29 DIAGNOSIS — I110 Hypertensive heart disease with heart failure: Secondary | ICD-10-CM | POA: Diagnosis present

## 2017-03-29 DIAGNOSIS — I825Z2 Chronic embolism and thrombosis of unspecified deep veins of left distal lower extremity: Secondary | ICD-10-CM

## 2017-03-29 DIAGNOSIS — E1142 Type 2 diabetes mellitus with diabetic polyneuropathy: Secondary | ICD-10-CM | POA: Diagnosis present

## 2017-03-29 DIAGNOSIS — Y95 Nosocomial condition: Secondary | ICD-10-CM | POA: Diagnosis present

## 2017-03-29 DIAGNOSIS — R0902 Hypoxemia: Secondary | ICD-10-CM

## 2017-03-29 DIAGNOSIS — Z8249 Family history of ischemic heart disease and other diseases of the circulatory system: Secondary | ICD-10-CM | POA: Diagnosis not present

## 2017-03-29 DIAGNOSIS — L03031 Cellulitis of right toe: Secondary | ICD-10-CM

## 2017-03-29 DIAGNOSIS — G47419 Narcolepsy without cataplexy: Secondary | ICD-10-CM

## 2017-03-29 DIAGNOSIS — I5032 Chronic diastolic (congestive) heart failure: Secondary | ICD-10-CM

## 2017-03-29 DIAGNOSIS — F32A Depression, unspecified: Secondary | ICD-10-CM | POA: Diagnosis present

## 2017-03-29 DIAGNOSIS — Z90710 Acquired absence of both cervix and uterus: Secondary | ICD-10-CM | POA: Diagnosis not present

## 2017-03-29 DIAGNOSIS — J181 Lobar pneumonia, unspecified organism: Principal | ICD-10-CM | POA: Diagnosis present

## 2017-03-29 DIAGNOSIS — D501 Sideropenic dysphagia: Secondary | ICD-10-CM

## 2017-03-29 DIAGNOSIS — K2100 Gastro-esophageal reflux disease with esophagitis, without bleeding: Secondary | ICD-10-CM

## 2017-03-29 DIAGNOSIS — E669 Obesity, unspecified: Secondary | ICD-10-CM | POA: Diagnosis present

## 2017-03-29 DIAGNOSIS — Z86718 Personal history of other venous thrombosis and embolism: Secondary | ICD-10-CM

## 2017-03-29 DIAGNOSIS — I050 Rheumatic mitral stenosis: Secondary | ICD-10-CM

## 2017-03-29 DIAGNOSIS — I509 Heart failure, unspecified: Secondary | ICD-10-CM

## 2017-03-29 DIAGNOSIS — Z880 Allergy status to penicillin: Secondary | ICD-10-CM | POA: Diagnosis not present

## 2017-03-29 DIAGNOSIS — I825Z9 Chronic embolism and thrombosis of unspecified deep veins of unspecified distal lower extremity: Secondary | ICD-10-CM | POA: Diagnosis present

## 2017-03-29 DIAGNOSIS — K760 Fatty (change of) liver, not elsewhere classified: Secondary | ICD-10-CM

## 2017-03-29 LAB — PROTIME-INR
INR: 2.22
Prothrombin Time: 24.4 seconds — ABNORMAL HIGH (ref 11.4–15.2)

## 2017-03-29 LAB — COMPREHENSIVE METABOLIC PANEL
ALBUMIN: 3 g/dL — AB (ref 3.5–5.0)
ALT: 15 U/L (ref 14–54)
AST: 22 U/L (ref 15–41)
Alkaline Phosphatase: 51 U/L (ref 38–126)
Anion gap: 11 (ref 5–15)
BILIRUBIN TOTAL: 0.7 mg/dL (ref 0.3–1.2)
BUN: 22 mg/dL — AB (ref 6–20)
CHLORIDE: 96 mmol/L — AB (ref 101–111)
CO2: 25 mmol/L (ref 22–32)
CREATININE: 1 mg/dL (ref 0.44–1.00)
Calcium: 8.4 mg/dL — ABNORMAL LOW (ref 8.9–10.3)
GFR calc Af Amer: >60 mL/min (ref >60)
GFR, EST NON AFRICAN AMERICAN: 53 mL/min — AB (ref >60)
GLUCOSE: 127 mg/dL — AB (ref 65–99)
Potassium: 4.3 mmol/L (ref 3.5–5.1)
Sodium: 132 mmol/L — ABNORMAL LOW (ref 135–145)
Total Protein: 7.1 g/dL (ref 6.5–8.1)

## 2017-03-29 LAB — CBC WITH DIFFERENTIAL/PLATELET
Basophils Absolute: 0 10*3/uL (ref 0.0–0.1)
Basophils Relative: 0 %
EOS ABS: 0.2 10*3/uL (ref 0.0–0.7)
EOS PCT: 4 %
HCT: 38.2 % (ref 36.0–46.0)
Hemoglobin: 12.2 g/dL (ref 12.0–15.0)
Lymphocytes Relative: 19 %
Lymphs Abs: 1 10*3/uL (ref 0.7–4.0)
MCH: 28.5 pg (ref 26.0–34.0)
MCHC: 31.9 g/dL (ref 30.0–36.0)
MCV: 89.3 fL (ref 78.0–100.0)
MONO ABS: 0.3 10*3/uL (ref 0.1–1.0)
Monocytes Relative: 6 %
Neutro Abs: 3.8 10*3/uL (ref 1.7–7.7)
Neutrophils Relative %: 71 %
PLATELETS: 191 10*3/uL (ref 150–400)
RBC: 4.28 MIL/uL (ref 3.87–5.11)
RDW: 14.5 % (ref 11.5–15.5)
WBC: 5.3 10*3/uL (ref 4.0–10.5)

## 2017-03-29 LAB — INFLUENZA PANEL BY PCR (TYPE A & B)
Influenza A By PCR: NEGATIVE
Influenza B By PCR: NEGATIVE

## 2017-03-29 LAB — CBG MONITORING, ED: Glucose-Capillary: 227 mg/dL — ABNORMAL HIGH (ref 65–99)

## 2017-03-29 LAB — BRAIN NATRIURETIC PEPTIDE: B Natriuretic Peptide: 457 pg/mL — ABNORMAL HIGH (ref 0.0–100.0)

## 2017-03-29 MED ORDER — QUETIAPINE FUMARATE 100 MG PO TABS
100.0000 mg | ORAL_TABLET | Freq: Every day | ORAL | Status: DC
Start: 2017-03-29 — End: 2017-04-03
  Administered 2017-03-30 – 2017-04-02 (×5): 100 mg via ORAL
  Filled 2017-03-29 (×5): qty 1

## 2017-03-29 MED ORDER — MIRTAZAPINE 15 MG PO TABS
15.0000 mg | ORAL_TABLET | Freq: Every day | ORAL | Status: DC
  Administered 2017-03-30 – 2017-04-02 (×5): 15 mg via ORAL
  Filled 2017-03-29 (×5): qty 1

## 2017-03-29 MED ORDER — MAGNESIUM OXIDE 400 (241.3 MG) MG PO TABS
400.0000 mg | ORAL_TABLET | Freq: Every day | ORAL | Status: DC
  Administered 2017-03-30 – 2017-04-03 (×5): 400 mg via ORAL
  Filled 2017-03-29 (×5): qty 1

## 2017-03-29 MED ORDER — DULOXETINE HCL 60 MG PO CPEP
60.0000 mg | ORAL_CAPSULE | Freq: Every day | ORAL | Status: DC
  Administered 2017-03-30 – 2017-04-03 (×5): 60 mg via ORAL
  Filled 2017-03-29 (×5): qty 1

## 2017-03-29 MED ORDER — WARFARIN - PHARMACIST DOSING INPATIENT
Status: DC
  Administered 2017-03-30 – 2017-04-02 (×3)

## 2017-03-29 MED ORDER — ALBUTEROL SULFATE (2.5 MG/3ML) 0.083% IN NEBU
2.5000 mg | INHALATION_SOLUTION | RESPIRATORY_TRACT | Status: DC | PRN
  Administered 2017-03-29: 2.5 mg via RESPIRATORY_TRACT
  Filled 2017-03-29: qty 3

## 2017-03-29 MED ORDER — VANCOMYCIN HCL IN DEXTROSE 1-5 GM/200ML-% IV SOLN
1000.0000 mg | Freq: Two times a day (BID) | INTRAVENOUS | Status: DC
  Administered 2017-03-30 – 2017-04-01 (×6): 1000 mg via INTRAVENOUS
  Filled 2017-03-29 (×6): qty 200

## 2017-03-29 MED ORDER — CO Q10 200 MG PO CAPS: 2.0000 | ORAL_CAPSULE | Freq: Every day | ORAL | Status: DC

## 2017-03-29 MED ORDER — CARVEDILOL 3.125 MG PO TABS
3.1250 mg | ORAL_TABLET | Freq: Two times a day (BID) | ORAL | Status: DC
  Administered 2017-03-30 – 2017-04-03 (×9): 3.125 mg via ORAL
  Filled 2017-03-29 (×13): qty 1

## 2017-03-29 MED ORDER — VANCOMYCIN HCL IN DEXTROSE 1-5 GM/200ML-% IV SOLN: 1000.0000 mg | Freq: Two times a day (BID) | INTRAVENOUS | Status: DC

## 2017-03-29 MED ORDER — VANCOMYCIN HCL 10 G IV SOLR
2000.0000 mg | Freq: Once | INTRAVENOUS | Status: DC
  Filled 2017-03-29: qty 2000

## 2017-03-29 MED ORDER — HYDROCODONE-ACETAMINOPHEN 5-325 MG PO TABS
1.0000 | ORAL_TABLET | ORAL | Status: DC | PRN
  Administered 2017-03-29 – 2017-04-02 (×6): 1 via ORAL
  Filled 2017-03-29 (×6): qty 1

## 2017-03-29 MED ORDER — FUROSEMIDE 10 MG/ML IJ SOLN
40.0000 mg | Freq: Once | INTRAMUSCULAR | Status: AC
  Administered 2017-03-29: 40 mg via INTRAVENOUS
  Filled 2017-03-29: qty 4

## 2017-03-29 MED ORDER — INSULIN ASPART 100 UNIT/ML ~~LOC~~ SOLN
0.0000 [IU] | Freq: Every day | SUBCUTANEOUS | Status: DC
  Administered 2017-03-30: 2 [IU] via SUBCUTANEOUS
  Administered 2017-04-02: 3 [IU] via SUBCUTANEOUS

## 2017-03-29 MED ORDER — ONDANSETRON HCL 4 MG/2ML IJ SOLN: 4.0000 mg | Freq: Four times a day (QID) | INTRAMUSCULAR | Status: DC | PRN

## 2017-03-29 MED ORDER — DOCUSATE SODIUM 100 MG PO CAPS
100.0000 mg | ORAL_CAPSULE | Freq: Two times a day (BID) | ORAL | Status: DC
  Administered 2017-03-30 – 2017-04-03 (×10): 100 mg via ORAL
  Filled 2017-03-29 (×10): qty 1

## 2017-03-29 MED ORDER — SODIUM CHLORIDE 0.9 % IV SOLN
INTRAVENOUS | Status: DC
  Administered 2017-03-29 – 2017-04-03 (×7): via INTRAVENOUS

## 2017-03-29 MED ORDER — FERROUS SULFATE 325 (65 FE) MG PO TABS
325.0000 mg | ORAL_TABLET | Freq: Every day | ORAL | Status: DC
  Administered 2017-03-30 – 2017-04-03 (×5): 325 mg via ORAL
  Filled 2017-03-29 (×5): qty 1

## 2017-03-29 MED ORDER — INSULIN GLARGINE 100 UNIT/ML ~~LOC~~ SOLN
24.0000 [IU] | Freq: Every day | SUBCUTANEOUS | Status: DC
  Administered 2017-03-30 – 2017-04-02 (×4): 24 [IU] via SUBCUTANEOUS
  Filled 2017-03-29 (×6): qty 0.24

## 2017-03-29 MED ORDER — INSULIN ASPART 100 UNIT/ML ~~LOC~~ SOLN
0.0000 [IU] | Freq: Three times a day (TID) | SUBCUTANEOUS | Status: DC
  Administered 2017-03-30: 2 [IU] via SUBCUTANEOUS
  Administered 2017-03-30 – 2017-03-31 (×2): 1 [IU] via SUBCUTANEOUS
  Administered 2017-04-01: 3 [IU] via SUBCUTANEOUS
  Administered 2017-04-02 (×2): 2 [IU] via SUBCUTANEOUS
  Administered 2017-04-03: 1 [IU] via SUBCUTANEOUS

## 2017-03-29 MED ORDER — ONDANSETRON HCL 4 MG PO TABS: 4.0000 mg | ORAL_TABLET | Freq: Four times a day (QID) | ORAL | Status: DC | PRN

## 2017-03-29 MED ORDER — ACETAMINOPHEN 325 MG PO TABS: 650.0000 mg | ORAL_TABLET | Freq: Four times a day (QID) | ORAL | Status: DC | PRN

## 2017-03-29 MED ORDER — SODIUM CHLORIDE 0.9 % IV SOLN
2.0000 g | Freq: Once | INTRAVENOUS | Status: AC
  Administered 2017-03-29: 2 g via INTRAVENOUS
  Filled 2017-03-29: qty 2

## 2017-03-29 MED ORDER — ADULT MULTIVITAMIN W/MINERALS CH
1.0000 | ORAL_TABLET | Freq: Every day | ORAL | Status: DC
  Administered 2017-03-30 – 2017-04-03 (×6): 1 via ORAL
  Filled 2017-03-29 (×6): qty 1

## 2017-03-29 MED ORDER — FUROSEMIDE 20 MG PO TABS
20.0000 mg | ORAL_TABLET | Freq: Two times a day (BID) | ORAL | Status: DC
  Administered 2017-03-29 – 2017-04-03 (×10): 20 mg via ORAL
  Filled 2017-03-29 (×10): qty 1

## 2017-03-29 MED ORDER — SODIUM CHLORIDE 0.9 % IV SOLN
2.0000 g | Freq: Three times a day (TID) | INTRAVENOUS | Status: DC
  Administered 2017-03-29 – 2017-04-03 (×14): 2 g via INTRAVENOUS
  Filled 2017-03-29 (×19): qty 2

## 2017-03-29 MED ORDER — VANCOMYCIN HCL IN DEXTROSE 1-5 GM/200ML-% IV SOLN
1000.0000 mg | Freq: Once | INTRAVENOUS | Status: AC
  Administered 2017-03-29: 1000 mg via INTRAVENOUS
  Filled 2017-03-29: qty 200

## 2017-03-29 MED ORDER — ACETAMINOPHEN 650 MG RE SUPP: 650.0000 mg | Freq: Four times a day (QID) | RECTAL | Status: DC | PRN

## 2017-03-29 MED ORDER — CLONAZEPAM 0.5 MG PO TABS
1.0000 mg | ORAL_TABLET | Freq: Every day | ORAL | Status: DC
  Administered 2017-03-30 – 2017-04-02 (×5): 1 mg via ORAL
  Filled 2017-03-29 (×5): qty 2

## 2017-03-29 MED ORDER — EXENATIDE ER 2 MG ~~LOC~~ PEN: 2.0000 mg | PEN_INJECTOR | SUBCUTANEOUS | Status: DC

## 2017-03-29 MED ORDER — POTASSIUM CHLORIDE CRYS ER 10 MEQ PO TBCR
10.0000 meq | EXTENDED_RELEASE_TABLET | Freq: Every day | ORAL | Status: DC
  Administered 2017-03-30 – 2017-04-03 (×5): 10 meq via ORAL
  Filled 2017-03-29 (×8): qty 1

## 2017-03-29 MED ORDER — VITAMIN D 1000 UNITS PO TABS
3000.0000 [IU] | ORAL_TABLET | Freq: Every day | ORAL | Status: DC
  Administered 2017-03-30 – 2017-04-03 (×5): 3000 [IU] via ORAL
  Filled 2017-03-29 (×8): qty 3

## 2017-03-29 MED ORDER — INSULIN ASPART 100 UNIT/ML ~~LOC~~ SOLN
3.0000 [IU] | Freq: Three times a day (TID) | SUBCUTANEOUS | Status: DC
  Administered 2017-03-30 – 2017-04-03 (×10): 3 [IU] via SUBCUTANEOUS

## 2017-03-29 MED ORDER — WARFARIN SODIUM 2.5 MG PO TABS
2.5000 mg | ORAL_TABLET | Freq: Once | ORAL | Status: DC
  Filled 2017-03-29 (×2): qty 1

## 2017-03-29 MED ORDER — CLOMIPRAMINE HCL 25 MG PO CAPS
25.0000 mg | ORAL_CAPSULE | Freq: Four times a day (QID) | ORAL | Status: DC
  Administered 2017-03-29 – 2017-04-03 (×18): 25 mg via ORAL
  Filled 2017-03-29 (×24): qty 1

## 2017-03-29 NOTE — ED Notes (Signed)
Pt provided with lunch tray.

## 2017-03-29 NOTE — Progress Notes (Signed)
ANTIBIOTIC CONSULT NOTE-Preliminary  Pharmacy Consult for Vancomycin Indication: Pneumonia  Allergies  Allergen Reactions  . Codeine Anxiety  . Compazine Anaphylaxis  . Other Anaphylaxis, Rash and Other (See Comments)    Uncoded Allergy. Allergen: INOVAR Uncoded Allergy. Allergen: ALL ADHESIVES Uncoded Allergy. Allergen: SILK SUTURE Uncoded Allergy. Allergen: merthiolate Thermasol  . Penicillins Shortness Of Breath and Rash    Has patient had a PCN reaction causing immediate rash, facial/tongue/throat swelling, SOB or lightheadedness with hypotension: Yes Has patient had a PCN reaction causing severe rash involving mucus membranes or skin necrosis: No Has patient had a PCN reaction that required hospitalization No Has patient had a PCN reaction occurring within the last 10 years: Yes If all of the above answers are "NO", then may proceed with Cephalosporin use.   Marland Kitchen Morphine And Related Other (See Comments)    Patient states that it makes her lose her state of mind.   . Demerol [Meperidine] Rash    Patient Measurements: Height: 5\' 8"  (172.7 cm) Weight: 224 lb (101.6 kg) IBW/kg (Calculated) : 63.9  Vital Signs: Temp: 98.7 F (37.1 C) (02/23 0502) Temp Source: Oral (02/23 0502) BP: 122/62 (02/23 0502) Pulse Rate: 73 (02/23 0515)  Labs: No results for input(s): WBC, HGB, PLT, LABCREA, CREATININE in the last 72 hours.  Estimated Creatinine Clearance: 48.2 mL/min (A) (by C-G formula based on SCr of 1.22 mg/dL (H)).  No results for input(s): VANCOTROUGH, VANCOPEAK, VANCORANDOM, GENTTROUGH, GENTPEAK, GENTRANDOM, TOBRATROUGH, TOBRAPEAK, TOBRARND, AMIKACINPEAK, AMIKACINTROU, AMIKACIN in the last 72 hours.   Microbiology: Recent Results (from the past 720 hour(s))  Culture, blood (routine x 2)     Status: None   Collection Time: 03/03/17 11:00 AM  Result Value Ref Range Status   Specimen Description RIGHT ANTECUBITAL  Final   Special Requests   Final    BOTTLES DRAWN AEROBIC  AND ANAEROBIC Blood Culture adequate volume   Culture   Final    NO GROWTH 5 DAYS Performed at [facility], [address] [place], [place] [address]    Report Status 03/08/2017 FINAL  Final  Culture, blood (routine x 2)     Status: None   Collection Time: 03/03/17 11:06 AM  Result Value Ref Range Status   Specimen Description LEFT ANTECUBITAL  Final   Special Requests   Final    BOTTLES DRAWN AEROBIC AND ANAEROBIC Blood Culture adequate volume   Culture   Final    NO GROWTH 5 DAYS Performed at [facility], [address] [place], [place] [address]    Report Status 03/08/2017 FINAL  Final    Medical History: Past Medical History:  Diagnosis Date  . Aortic stenosis   . Cataplexy   . Diastolic heart failure (HCC)   . Essential hypertension   . GERD (gastroesophageal reflux disease)   . Hiatal hernia   . History of recurrent deep vein thrombosis (DVT)   . Mitral stenosis   . Narcolepsy   . Neuropathy   . Type 2 diabetes mellitus (HCC)     Medications:  Aztreonam 2 Gm IV x 1 dose in the ED  Assessment: 77 yo female seen in the ED for SOB and cough. Chest xray shows possible right upper lobe pneumonia. Pharmacy has been consulted for vancomycin dosing.  Goal of Therapy:  Vancomycin troughs 15-20 mcg/ml  Plan:  Preliminary review of pertinent patient information completed.  Protocol will be initiated with a loading dose of Vancomycin 2000 mg IV  Jeani Hawking clinical pharmacist will  complete review during morning rounds to assess patient and finalize treatment regimen if needed.  Arelia Sneddon, [facility] 03/29/2017,6:32 AM

## 2017-03-29 NOTE — ED Notes (Signed)
Pt sleeping at this time. No distress noted.

## 2017-03-29 NOTE — ED Notes (Signed)
Pt inquiring about home medications.  Spoke with Dr. Ardyth Harps who will be down shortly to evaluate and order.  Verbal order taken for nebs.

## 2017-03-29 NOTE — ED Provider Notes (Addendum)
[facility] EMERGENCY DEPARTMENT Provider Note   CSN: [id number] Arrival date & time: 03/29/17  0447  Time seen 05:55 AM   History   Chief Complaint Chief Complaint  Patient presents with  . Cough  . Shortness of Breath    HPI PRESLEI BLAKLEY is a 77 y.o. female.  HPI the patient is here of her son.  They states she was discharged from the hospital 4-5 days ago after admission for cellulitis.  When I review the chart it appears she was discharged on February 6 for cellulitis with Keflex.  She did come to the ED for a weakness on February 19, but she was not admitted at that time.  She states she started feeling bad the next day with a cough productive of brown/reddish mucus.  Son states the blood is mixed with the mucus-like streaks.  They are unaware fever.  She started feeling short of breath tonight.  She has been gurgling and having wheezing.  She is never had to use the inhaler or nebulizer she has had pneumonia in the past but cannot recall if this feels the same.  She has some mild chest pain in the center of her chest when she coughs.  She denies nausea, vomiting, or diarrhea.  She called her doctor who called in a Z-Pak and she has had one dose.  EMS report her pulse ox was 80-82% on room air.  She states she has not been on oxygen at home.  Patient has a history of congestive heart failure in September when patient needed a pacemaker.  PCP [name], MD   Past Medical History:  Diagnosis Date  . Aortic stenosis   . Cataplexy   . Diastolic heart failure (HCC)   . Essential hypertension   . GERD (gastroesophageal reflux disease)   . Hiatal hernia   . History of recurrent deep vein thrombosis (DVT)   . Mitral stenosis   . Narcolepsy   . Neuropathy   . Type 2 diabetes mellitus [facility])     Patient Active Problem List   Diagnosis Date Noted  . Cellulitis 03/03/2017  . Type 2 diabetes mellitus with other specified complication (HCC) 03/03/2017  . CKD (chronic kidney  disease), stage III (HCC) 03/03/2017  . Status post placement of cardiac pacemaker 10/29/2016  . Pulmonary edema 10/23/2016  . CAP (community acquired pneumonia) 02/22/2015  . Fall at home 09/21/2014  . Mild aortic stenosis 09/08/2014  . Mild to moderate  mitral stenosis 09/08/2014  . Chronic anticoagulation-Coumadin 09/08/2014  . Sepsis (HCC) 09/05/2014  . Lower leg DVT (deep venous thromboembolism), chronic (HCC) 09/05/2014  . Essential hypertension 09/05/2014  . Depression 09/05/2014  . Poorly controlled diabetes mellitus (HCC) 09/05/2014  . Hypomagnesemia 09/05/2014  . Hyponatremia 09/05/2014  . Pyelonephritis 09/05/2014  . Chronic diastolic CHF (congestive heart failure) (HCC)   . 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 GERD (gastroesophageal reflux disease) 07/08/2011  . Gastroparesis 07/08/2011  . IDA (iron deficiency anemia) 07/08/2011  . History of colonic polyps 07/08/2011  . Narcolepsy-evaluated at [facility] 07/08/2011  . NAFLD (nonalcoholic fatty liver disease) 95/62/1308  . Obesity-BMI 37 07/08/2011    Past Surgical History:  Procedure Laterality Date  . ABDOMINAL HYSTERECTOMY    . CARPAL TUNNEL RELEASE    . CARPECTOMY  12/26/2011   Procedure: CARPECTOMY;  Surgeon: Wyn Forster., MD;  Location: [facility];  Service: Orthopedics;  Laterality: Right;  PROXIMAL ROW CARPECTOMY RIGHT WRIST and  neurectomy  . CHOLECYSTECTOMY    . HEMORROIDECTOMY    . KIDNEY SURGERY    . NEPHROSTOMY    . PACEMAKER IMPLANT N/A 10/25/2016   Procedure: Pacemaker Implant;  Surgeon: Regan Lemming, MD;  Location: [facility] INVASIVE CV LAB;  Service: Cardiovascular;  Laterality: N/A;  . TOTAL HIP ARTHROPLASTY     bilateral    OB History    No data available       Home Medications    Prior to Admission medications   Medication Sig Start Date End Date Taking? Authorizing Provider  BYDUREON 2 MG PEN Inject 2 mg into the skin once a week. 01/03/16    [provider]  carvedilol (COREG) 3.125 MG tablet Take 1 tablet (3.125 mg total) by mouth 2 (two) times daily with a meal. 10/28/16   Sheilah Pigeon, PA-C  celecoxib (CELEBREX) 200 MG capsule Take 200 mg by mouth daily. For arthritis    [provider]  cephALEXin (KEFLEX) 500 MG capsule Take 1 capsule (500 mg total) by mouth every 6 (six) hours. Patient not taking: Reported on 03/25/2017 03/12/17   [name], MD  Cholecalciferol (VITAMIN D3) 3000 UNITS TABS Take 1 capsule by mouth daily.    [provider]  clomiPRAMINE (ANAFRANIL) 25 MG capsule Take 25 mg by mouth 4 (four) times daily.     [provider]  clonazePAM (KLONOPIN) 1 MG tablet Take 1 mg by mouth at bedtime.     [provider]  Coenzyme Q10 (CO Q10) 200 MG CAPS Take 2 capsules by mouth daily.     [provider]  DULoxetine (CYMBALTA) 60 MG capsule Take 60 mg by mouth daily.      [provider]  ferrous sulfate 325 (65 FE) MG tablet Take 325 mg by mouth daily with breakfast.    [provider]  furosemide (LASIX) 40 MG tablet Take 20 mg by mouth 2 (two) times daily.  12/01/14   [provider]  Hyaluronic Acid-Vitamin C (HYALURONIC ACID PO) Take 100 mg by mouth daily.    [provider]  HYDROcodone-acetaminophen (NORCO/VICODIN) 5-325 MG tablet Take 1 tablet by mouth every 4 (four) hours as needed. 03/25/17   Burgess Amor, PA-C  insulin glargine (LANTUS) 100 unit/mL SOPN Inject 24 Units into the skin at bedtime.     [provider]  Insulin Glargine (TOUJEO MAX SOLOSTAR) 300 UNIT/ML SOPN Inject 24 Units into the skin every evening.    [provider]  magnesium oxide (MAG-OX) 400 MG tablet Take 400 mg by mouth daily.    [provider]  metFORMIN (GLUCOPHAGE) 500 MG tablet Take 500 mg by mouth 2 (two) times daily with a meal.      [provider]  mirtazapine (REMERON) 15 MG tablet Take 15 mg by mouth  at bedtime.      [provider]  Multiple Vitamin (MULTIVITAMIN WITH MINERALS) TABS tablet Take 1 tablet by mouth daily.    [provider]  potassium chloride (K-DUR) 10 MEQ tablet Take 1 tablet (10 mEq total) by mouth daily. 09/21/14   Dyann Kief, PA-C  Probiotic Product (PROBIOTIC ADVANCED PO) Take 1 capsule by mouth daily.    [provider]  QUEtiapine (SEROQUEL) 100 MG tablet Take 100 mg by mouth at bedtime.    [provider]  warfarin (COUMADIN) 5 MG tablet Take 2.5-5 mg by mouth every evening. Starting 10/29/2016 take 2.5 mg alternating with 5 mg daily  [provider]    Family History Family History  Problem Relation Age of Onset  . Heart disease Father   . Diabetes Unknown   . Stroke Mother   . Scleroderma Brother     Social History Social History   Tobacco Use  . Smoking status: Former Smoker    Last attempt to quit: 10/13/1988    Years since quitting: 28.4  . Smokeless tobacco: Never Used  Substance Use Topics  . Alcohol use: No    Alcohol/week: 0.0 oz  . Drug use: No  lives at home Lives with son   Allergies   Codeine; Compazine; Other; Penicillins; Morphine and related; and Demerol [meperidine]   Review of Systems Review of Systems   Physical Exam Updated Vital Signs BP (!) 116/56   Pulse 75   Temp 98.7 F (37.1 C) (Oral)   Resp (!) 28   Ht 5\' 8"  (1.727 m)   Wt 101.6 kg (224 lb)   SpO2 95%   BMI 34.06 kg/m   Physical Exam  Constitutional: She is oriented to person, place, and time. She appears well-developed and well-nourished.  Non-toxic appearance. She does not appear ill. No distress.  HENT:  Head: Normocephalic and atraumatic.  Right Ear: External ear normal.  Left Ear: External ear normal.  Nose: Nose normal. No mucosal edema or rhinorrhea.  Mouth/Throat: Oropharynx is clear and moist and mucous membranes are normal. No dental abscesses or uvula swelling.  Eyes: Conjunctivae and EOM  are normal. Pupils are equal, round, and reactive to light.  Neck: Normal range of motion and full passive range of motion without pain. Neck supple.  Cardiovascular: Normal rate, regular rhythm and normal heart sounds. Exam reveals no gallop and no friction rub.  No murmur heard. Pulmonary/Chest: Effort normal. No respiratory distress. She has decreased breath sounds. She has no wheezes. She has no rhonchi. She has no rales. She exhibits no tenderness and no crepitus.  Abdominal: Soft. Normal appearance and bowel sounds are normal. She exhibits no distension. There is no tenderness. There is no rebound and no guarding.  Musculoskeletal: Normal range of motion. She exhibits no edema or tenderness.  Moves all extremities well.   Neurological: She is alert and oriented to person, place, and time. She has normal strength. No cranial nerve deficit.  Skin: Skin is warm, dry and intact. No rash noted. No erythema. No pallor.  Psychiatric: She has a normal mood and affect. Her speech is normal and behavior is normal. Her mood appears not anxious.  Nursing note and vitals reviewed.    ED Treatments / Results  Labs (all labs ordered are listed, but only abnormal results are displayed) Results for orders placed or performed during the hospital encounter of 03/29/17  Comprehensive metabolic panel  Result Value Ref Range   Sodium 132 (L) 135 - 145 mmol/L   Potassium 4.3 3.5 - 5.1 mmol/L   Chloride 96 (L) 101 - 111 mmol/L   CO2 25 22 - 32 mmol/L   Glucose, Bld 127 (H) 65 - 99 mg/dL   BUN 22 (H) 6 - 20 mg/dL   Creatinine, Ser 1.61 0.44 - 1.00 mg/dL   Calcium 8.4 (L) 8.9 - 10.3 mg/dL   Total Protein 7.1 6.5 - 8.1 g/dL   Albumin 3.0 (L) 3.5 - 5.0 g/dL   AST 22 15 - 41 U/L   ALT 15 14 - 54 U/L   Alkaline Phosphatase 51 38 - 126 U/L   Total Bilirubin 0.7  0.3 - 1.2 mg/dL   GFR calc non Af Amer 53 (L) >60 mL/min   GFR calc Af Amer >60 >60 mL/min   Anion gap 11 5 - 15  CBC with Differential  Result  Value Ref Range   WBC 5.3 4.0 - 10.5 K/uL   RBC 4.28 3.87 - 5.11 MIL/uL   Hemoglobin 12.2 12.0 - 15.0 g/dL   HCT 16.1 09.6 - 04.5 %   MCV 89.3 78.0 - 100.0 fL   MCH 28.5 26.0 - 34.0 pg   MCHC 31.9 30.0 - 36.0 g/dL   RDW 40.9 81.1 - 91.4 %   Platelets 191 150 - 400 K/uL   Neutrophils Relative % 71 %   Neutro Abs 3.8 1.7 - 7.7 K/uL   Lymphocytes Relative 19 %   Lymphs Abs 1.0 0.7 - 4.0 K/uL   Monocytes Relative 6 %   Monocytes Absolute 0.3 0.1 - 1.0 K/uL   Eosinophils Relative 4 %   Eosinophils Absolute 0.2 0.0 - 0.7 K/uL   Basophils Relative 0 %   Basophils Absolute 0.0 0.0 - 0.1 K/uL  Brain natriuretic peptide  Result Value Ref Range   B Natriuretic Peptide 457.0 (H) 0.0 - 100.0 pg/mL  Protime-INR  Result Value Ref Range   Prothrombin Time 24.4 (H) 11.4 - 15.2 seconds   INR 2.22    Laboratory interpretation all normal except renal insufficiency, therapeutic INR        EKG  EKG Interpretation  Date/Time:  Saturday March 29 2017 05:03:04 EST Ventricular Rate:  76 PR Interval:    QRS Duration: 152 QT Interval:  472 QTC Calculation: 531 R Axis:   -78 Text Interpretation:  Atrial-sensed ventricular-paced rhythm No further analysis attempted due to paced rhythm No significant change since last tracing 03 Mar 2017 Confirmed by Devoria Albe ([id number]) on 03/29/2017 5:28:02 AM       Radiology Dg Chest 2 View  Result Date: 03/29/2017 CLINICAL DATA:  77 year old female with shortness of breath. EXAM: CHEST  2 VIEW COMPARISON:  Chest radiograph dated 10/26/2016 FINDINGS: There is an area of airspace opacity in the right upper lung field concerning for pneumonia. Clinical correlation is recommended. There is mild cardiomegaly with mild vascular congestion small bilateral pleural effusions may be present. No pneumothorax. Left pectoral pacemaker device. No acute osseous pathology IMPRESSION: 1. Focal opacity in the right upper lung field concerning for pneumonia. 2. Cardiomegaly  with mild vascular congestion and probable small bilateral pleural effusions. Electronically Signed   By: Elgie Collard M.D.   On: 03/29/2017 05:57     Procedures Procedures (including critical care time)  Medications Ordered in ED Medications  vancomycin (VANCOCIN) IVPB 1000 mg/200 mL premix (not administered)  vancomycin (VANCOCIN) IVPB 1000 mg/200 mL premix (not administered)  aztreonam (AZACTAM) 2 g in sodium chloride 0.9 % 100 mL IVPB (0 g Intravenous Stopped 03/29/17 0725)  furosemide (LASIX) injection 40 mg (40 mg Intravenous Given 03/29/17 6213)     Initial Impression / Assessment and Plan / ED Course  I have reviewed the triage vital signs and the nursing notes.  Pertinent labs & imaging results that were available during my care of the patient were reviewed by me and considered in my medical decision making (see chart for details).     Due to recent hospitalization patient was started on healthcare associated pneumonia antibiotics.  Laboratory testing was done.  Due to her x-ray showing some underlying congestive heart failure in addition to the pneumonia she  was also given Lasix IV.  I discussed with the family due to her hypoxia she would need to be admitted and they are agreeable.  08:18 AM Dr Ardyth Harps, hospitalist will admit. Wants flu test to be done.   Final Clinical Impressions(s) / ED Diagnoses   Final diagnoses:  Healthcare-associated pneumonia  Hypoxia  Acute on chronic congestive heart failure, unspecified heart failure type [facility])    Plan admission  Devoria Albe, MD, Concha Pyo, MD 03/29/17 Darrel Hoover    Devoria Albe, MD 04/10/17 0900

## 2017-03-29 NOTE — Progress Notes (Signed)
ANTIBIOTIC CONSULT NOTE  Pharmacy Consult for Vancomycin Indication: Pneumonia  Allergies  Allergen Reactions  . Codeine Anxiety  . Compazine Anaphylaxis  . Other Anaphylaxis, Rash and Other (See Comments)    Uncoded Allergy. Allergen: INOVAR Uncoded Allergy. Allergen: ALL ADHESIVES Uncoded Allergy. Allergen: SILK SUTURE Uncoded Allergy. Allergen: merthiolate Thermasol  . Penicillins Shortness Of Breath and Rash    Has patient had a PCN reaction causing immediate rash, facial/tongue/throat swelling, SOB or lightheadedness with hypotension: Yes Has patient had a PCN reaction causing severe rash involving mucus membranes or skin necrosis: No Has patient had a PCN reaction that required hospitalization No Has patient had a PCN reaction occurring within the last 10 years: Yes If all of the above answers are "NO", then may proceed with Cephalosporin use.   Marland Kitchen Morphine And Related Other (See Comments)    Patient states that it makes her lose her state of mind.   . Demerol [Meperidine] Rash   Patient Measurements: Height: 5\' 8"  (172.7 cm) Weight: 224 lb (101.6 kg) IBW/kg (Calculated) : 63.9  Vital Signs: Temp: 98.7 F (37.1 C) (02/23 0502) Temp Source: Oral (02/23 0502) BP: 116/56 (02/23 0730) Pulse Rate: 75 (02/23 0730)  Labs: Recent Labs    03/29/17 0604  WBC 5.3  HGB 12.2  PLT 191  CREATININE 1.00   Estimated Creatinine Clearance: 58.8 mL/min (by C-G formula based on SCr of 1 mg/dL).  No results for input(s): VANCOTROUGH, VANCOPEAK, VANCORANDOM, GENTTROUGH, GENTPEAK, GENTRANDOM, TOBRATROUGH, TOBRAPEAK, TOBRARND, AMIKACINPEAK, AMIKACINTROU, AMIKACIN in the last 72 hours.   Microbiology: Recent Results (from the past 720 hour(s))  Culture, blood (routine x 2)     Status: None   Collection Time: 03/03/17 11:00 AM  Result Value Ref Range Status   Specimen Description RIGHT ANTECUBITAL  Final   Special Requests   Final    BOTTLES DRAWN AEROBIC AND ANAEROBIC Blood  Culture adequate volume   Culture   Final    NO GROWTH 5 DAYS Performed at [facility], [address], [place], [place] [address]    Report Status 03/08/2017 FINAL  Final  Culture, blood (routine x 2)     Status: None   Collection Time: 03/03/17 11:06 AM  Result Value Ref Range Status   Specimen Description LEFT ANTECUBITAL  Final   Special Requests   Final    BOTTLES DRAWN AEROBIC AND ANAEROBIC Blood Culture adequate volume   Culture   Final    NO GROWTH 5 DAYS Performed at [facility], [address], [place], [place] [address]    Report Status 03/08/2017 FINAL  Final   Medical History: Past Medical History:  Diagnosis Date  . Aortic stenosis   . Cataplexy   . Diastolic heart failure (HCC)   . Essential hypertension   . GERD (gastroesophageal reflux disease)   . Hiatal hernia   . History of recurrent deep vein thrombosis (DVT)   . Mitral stenosis   . Narcolepsy   . Neuropathy   . Type 2 diabetes mellitus (HCC)    Medications:  Aztreonam 2 Gm IV x 1 dose in the ED  Assessment: 77 yo female seen in the ED for SOB and cough. Chest xray shows possible right upper lobe pneumonia. Pharmacy has been consulted for vancomycin dosing.  Goal of Therapy:  Vancomycin troughs 15-20 mcg/ml  Plan:  Vancomycin 1000mg  IV q12hrs (1st dose now) Monitor labs, progress, c/s F/U admission plans for further orders  Wayland Denis, [facility] 03/29/2017,8:12 AM

## 2017-03-29 NOTE — ED Notes (Signed)
Pt moved to hospital bed for comfort.  Pt made no attempt to help move herself or sit up.  Complained we were hurting her knees when we moved her.  Apologized to pt, but due to her size and lack of effort we had to move her quickly from one bed to another.  Pericare done.

## 2017-03-29 NOTE — ED Notes (Signed)
Pt was sating at 80-82% on room air. RN placed patient on 5l and O2 sat was 95%

## 2017-03-29 NOTE — ED Triage Notes (Signed)
Pt BIB EMS for cough/ SHOB. Pt was in the hospital for 10 days for celluitis. Was d/c'd but developed a chest cold prior. MD gave her a z-pak. Pt states she is coughing up blood and unable to ambulate

## 2017-03-29 NOTE — ED Notes (Signed)
Patient transported to X-ray 

## 2017-03-29 NOTE — Progress Notes (Signed)
ANTICOAGULATION CONSULT NOTE - Initial Consult  Pharmacy Consult for Warfarin (home med) Indication: VTE treatment  Allergies  Allergen Reactions  . Codeine Anxiety  . Compazine Anaphylaxis  . Other Anaphylaxis, Rash and Other (See Comments)    Uncoded Allergy. Allergen: INOVAR Uncoded Allergy. Allergen: ALL ADHESIVES Uncoded Allergy. Allergen: SILK SUTURE Uncoded Allergy. Allergen: merthiolate Thermasol  . Penicillins Shortness Of Breath and Rash    Has patient had a PCN reaction causing immediate rash, facial/tongue/throat swelling, SOB or lightheadedness with hypotension: Yes Has patient had a PCN reaction causing severe rash involving mucus membranes or skin necrosis: No Has patient had a PCN reaction that required hospitalization No Has patient had a PCN reaction occurring within the last 10 years: Yes If all of the above answers are "NO", then may proceed with Cephalosporin use.   Marland Kitchen Morphine And Related Other (See Comments)    Patient states that it makes her lose her state of mind.   . Demerol [Meperidine] Rash   Patient Measurements: Height: 5\' 8"  (172.7 cm) Weight: 224 lb (101.6 kg) IBW/kg (Calculated) : 63.9  Vital Signs: Temp: 98.7 F (37.1 C) (02/23 0502) Temp Source: Oral (02/23 0502) BP: 133/57 (02/23 1400) Pulse Rate: 76 (02/23 1330)  Labs: Recent Labs    03/29/17 0604 03/29/17 0605  HGB 12.2  --   HCT 38.2  --   PLT 191  --   LABPROT  --  24.4*  INR  --  2.22  CREATININE 1.00  --     Estimated Creatinine Clearance: 58.8 mL/min (by C-G formula based on SCr of 1 mg/dL).   Medical History: Past Medical History:  Diagnosis Date  . Aortic stenosis   . Cataplexy   . Diastolic heart failure (HCC)   . Essential hypertension   . GERD (gastroesophageal reflux disease)   . Hiatal hernia   . History of recurrent deep vein thrombosis (DVT)   . Mitral stenosis   . Narcolepsy   . Neuropathy   . Type 2 diabetes mellitus (HCC)    Medications:    (Not in a hospital admission)  Assessment: 77yo female on chronic Coumadin for h/o DVT.  INR therapeutic on admission.  Home dose reportedly 2.5mg  alternating w/ 5mg , last dose taken was yesterday per report.   Goal of Therapy:  INR 2-3 Monitor platelets by anticoagulation protocol: Yes   Plan: Coumadin 2.5mg  today x 1 INR daily  [name] A 03/29/2017,3:05 PM

## 2017-03-29 NOTE — ED Notes (Signed)
Pt becoming anxious about her home medications.  States she takes Anafranil four times daily and has not had it.  Admitting physician aware.

## 2017-03-29 NOTE — H&P (Signed)
History and Physical    Rebecca [name] JYN:[id number] DOB: 1940-06-24 DOA: 03/29/2017  Referring MD/NP/PA: Devoria Albe, EDP PCP: [name], MD  Patient coming from: Home  Chief Complaint: Cough with brownish sputum production   HPI: Rebecca [name] is a 77 y.o. female with medical comorbidities including hypertension, diabetes, GERD, diastolic heart failure, cataplexy, DVT on warfarin presents to the hospital today with cough and shortness of breath.  This began about 3 days ago.  Shortness of breath worsened overnight which is the reason for presentation.  She has been having a cough productive of brownish/reddish sputum.  Upon arrival to the ED vital signs are stable, labs are unremarkable, chest x-ray shows a focal opacity in the right upper lung field concerning for pneumonia, flu PCR is negative.  Admission is requested for further evaluation and management.  Past Medical/Surgical History: Past Medical History:  Diagnosis Date  . Aortic stenosis   . Cataplexy   . Diastolic heart failure (HCC)   . Essential hypertension   . GERD (gastroesophageal reflux disease)   . Hiatal hernia   . History of recurrent deep vein thrombosis (DVT)   . Mitral stenosis   . Narcolepsy   . Neuropathy   . Type 2 diabetes mellitus (HCC)     Past Surgical History:  Procedure Laterality Date  . ABDOMINAL HYSTERECTOMY    . CARPAL TUNNEL RELEASE    . CARPECTOMY  12/26/2011   Procedure: CARPECTOMY;  Surgeon: Wyn Forster., MD;  Location:  SURGERY CENTER;  Service: Orthopedics;  Laterality: Right;  PROXIMAL ROW CARPECTOMY RIGHT WRIST and neurectomy  . CHOLECYSTECTOMY    . HEMORROIDECTOMY    . KIDNEY SURGERY    . NEPHROSTOMY    . PACEMAKER IMPLANT N/A 10/25/2016   Procedure: Pacemaker Implant;  Surgeon: Regan Lemming, MD;  Location: [facility] INVASIVE CV LAB;  Service: Cardiovascular;  Laterality: N/A;  . TOTAL HIP ARTHROPLASTY     bilateral    Social History:  reports that she  quit smoking about 28 years ago. she has never used smokeless tobacco. She reports that she does not drink alcohol or use drugs.  Allergies: Allergies  Allergen Reactions  . Codeine Anxiety  . Compazine Anaphylaxis  . Other Anaphylaxis, Rash and Other (See Comments)    Uncoded Allergy. Allergen: INOVAR Uncoded Allergy. Allergen: ALL ADHESIVES Uncoded Allergy. Allergen: SILK SUTURE Uncoded Allergy. Allergen: merthiolate Thermasol  . Penicillins Shortness Of Breath and Rash    Has patient had a PCN reaction causing immediate rash, facial/tongue/throat swelling, SOB or lightheadedness with hypotension: Yes Has patient had a PCN reaction causing severe rash involving mucus membranes or skin necrosis: No Has patient had a PCN reaction that required hospitalization No Has patient had a PCN reaction occurring within the last 10 years: Yes If all of the above answers are "NO", then may proceed with Cephalosporin use.   Marland Kitchen Morphine And Related Other (See Comments)    Patient states that it makes her lose her state of mind.   . Demerol [Meperidine] Rash    Family History:  Family History  Problem Relation Age of Onset  . Heart disease Father   . Diabetes Unknown   . Stroke Mother   . Scleroderma Brother     Prior to Admission medications   Medication Sig Start Date End Date Taking? Authorizing Provider  azithromycin (ZITHROMAX) 250 MG tablet Take 250 mg by mouth daily.  03/28/17  Yes [provider]  BYDUREON 2  MG PEN Inject 2 mg into the skin once a week. 01/03/16  Yes [provider]  carvedilol (COREG) 3.125 MG tablet Take 1 tablet (3.125 mg total) by mouth 2 (two) times daily with a meal. 10/28/16  Yes Sheilah Pigeon, PA-C  celecoxib (CELEBREX) 200 MG capsule Take 200 mg by mouth daily. For arthritis   Yes [provider]  Cholecalciferol (VITAMIN D3) 3000 UNITS TABS Take 1 capsule by mouth daily.   Yes [provider]  clomiPRAMINE (ANAFRANIL)  25 MG capsule Take 25 mg by mouth 4 (four) times daily.    Yes [provider]  clonazePAM (KLONOPIN) 1 MG tablet Take 1 mg by mouth at bedtime.    Yes [provider]  Coenzyme Q10 (CO Q10) 200 MG CAPS Take 2 capsules by mouth daily.    Yes [provider]  DULoxetine (CYMBALTA) 60 MG capsule Take 60 mg by mouth daily.     Yes [provider]  ferrous sulfate 325 (65 FE) MG tablet Take 325 mg by mouth daily with breakfast.   Yes [provider]  furosemide (LASIX) 40 MG tablet Take 20 mg by mouth 2 (two) times daily.  12/01/14  Yes [provider]  Hyaluronic Acid-Vitamin C (HYALURONIC ACID PO) Take 100 mg by mouth daily.   Yes [provider]  HYDROcodone-acetaminophen (NORCO/VICODIN) 5-325 MG tablet Take 1 tablet by mouth every 4 (four) hours as needed. 03/25/17  Yes Idol, Raynelle Fanning, PA-C  insulin glargine (LANTUS) 100 unit/mL SOPN Inject 24 Units into the skin at bedtime.    Yes [provider]  Insulin Glargine (TOUJEO MAX SOLOSTAR) 300 UNIT/ML SOPN Inject 24 Units into the skin every evening.   Yes [provider]  magnesium oxide (MAG-OX) 400 MG tablet Take 400 mg by mouth daily.   Yes [provider]  metFORMIN (GLUCOPHAGE) 500 MG tablet Take 500 mg by mouth 2 (two) times daily with a meal.     Yes [provider]  mirtazapine (REMERON) 15 MG tablet Take 15 mg by mouth at bedtime.     Yes [provider]  Multiple Vitamin (MULTIVITAMIN WITH MINERALS) TABS tablet Take 1 tablet by mouth daily.   Yes [provider]  potassium chloride (K-DUR) 10 MEQ tablet Take 1 tablet (10 mEq total) by mouth daily. 09/21/14  Yes Dyann Kief, PA-C  Probiotic Product (PROBIOTIC ADVANCED PO) Take 1 capsule by mouth daily.   Yes [provider]  QUEtiapine (SEROQUEL) 100 MG tablet Take 100 mg by mouth at bedtime.   Yes [provider]  warfarin (COUMADIN) 5 MG tablet Take 2.5-5 mg  by mouth every evening. Starting 10/29/2016 take 2.5 mg alternating with 5 mg daily    Yes [provider]  cephALEXin (KEFLEX) 500 MG capsule Take 1 capsule (500 mg total) by mouth every 6 (six) hours. Patient not taking: Reported on 03/25/2017 03/12/17   [name], MD    Review of Systems:  Constitutional: Denies fever, chills, diaphoresis, appetite change and fatigue.  HEENT: Denies photophobia, eye pain, redness, hearing loss, ear pain, congestion, sore throat, rhinorrhea, sneezing, mouth sores, trouble swallowing, neck pain, neck stiffness and tinnitus.   Respiratory: Denies  chest tightness,  and wheezing.   Cardiovascular: Denies chest pain, palpitations and leg swelling.  Gastrointestinal: Denies nausea, vomiting, abdominal pain, diarrhea, constipation, blood in stool and abdominal distention.  Genitourinary: Denies dysuria, urgency, frequency, hematuria, flank pain and difficulty urinating.  Endocrine: Denies: hot or  cold intolerance, sweats, changes in hair or nails, polyuria, polydipsia. Musculoskeletal: Denies myalgias, back pain, joint swelling, arthralgias and gait problem.  Skin: Denies pallor, rash and wound.  Neurological: Denies dizziness, seizures, syncope, weakness, light-headedness, numbness and headaches.  Hematological: Denies adenopathy. Easy bruising, personal or family bleeding history  Psychiatric/Behavioral: Denies suicidal ideation, mood changes, confusion, nervousness, sleep disturbance and agitation    Physical Exam: Vitals:   03/29/17 1300 03/29/17 1330 03/29/17 1400 03/29/17 1430  BP: 125/62 135/67 (!) 133/57 118/66  Pulse: 74 76  78  Resp: (!) 26 (!) 23 (!) 24 13  Temp:      TempSrc:      SpO2: 98% 98%  99%  Weight:      Height:         Constitutional: NAD, calm, comfortable Eyes: PERRL, lids and conjunctivae normal ENMT: Mucous membranes are moist. Posterior pharynx clear of any exudate or lesions.Normal dentition.  Neck: normal,  supple, no masses, no thyromegaly Respiratory: Coarse bilateral breath sounds Cardiovascular: Regular rate and rhythm, no murmurs / rubs / gallops. No extremity edema. 2+ pedal pulses. No carotid bruits.  Abdomen: no tenderness, no masses palpated. No hepatosplenomegaly. Bowel sounds positive.  Musculoskeletal: no clubbing / cyanosis. No joint deformity upper and lower extremities. Good ROM, no contractures. Normal muscle tone.  Skin: no rashes, lesions, ulcers. No induration Neurologic: CN 2-12 grossly intact. Sensation intact, DTR normal. Strength 5/5 in all 4.  Psychiatric: Normal judgment and insight. Alert and oriented x 3. Normal mood.    Labs on Admission: I have personally reviewed the following labs and imaging studies  CBC: Recent Labs  Lab 03/25/17 1531 03/29/17 0604  WBC 5.5 5.3  NEUTROABS 3.3 3.8  HGB 12.9 12.2  HCT 40.4 38.2  MCV 90.4 89.3  PLT 255 191   Basic Metabolic Panel: Recent Labs  Lab 03/25/17 1531 03/29/17 0604  NA 132* 132*  K 4.0 4.3  CL 96* 96*  CO2 22 25  GLUCOSE 299* 127*  BUN 25* 22*  CREATININE 1.22* 1.00  CALCIUM 9.0 8.4*   GFR: Estimated Creatinine Clearance: 58.8 mL/min (by C-G formula based on SCr of 1 mg/dL). Liver Function Tests: Recent Labs  Lab 03/29/17 0604  AST 22  ALT 15  ALKPHOS 51  BILITOT 0.7  PROT 7.1  ALBUMIN 3.0*   No results for input(s): LIPASE, AMYLASE in the last 168 hours. No results for input(s): AMMONIA in the last 168 hours. Coagulation Profile: Recent Labs  Lab 03/25/17 1531 03/29/17 0605  INR 2.04 2.22   Cardiac Enzymes: No results for input(s): CKTOTAL, CKMB, CKMBINDEX, TROPONINI in the last 168 hours. BNP (last 3 results) No results for input(s): PROBNP in the last 8760 hours. HbA1C: No results for input(s): HGBA1C in the last 72 hours. CBG: No results for input(s): GLUCAP in the last 168 hours. Lipid Profile: No results for input(s): CHOL, HDL, LDLCALC, TRIG, CHOLHDL, LDLDIRECT in the  last 72 hours. Thyroid Function Tests: No results for input(s): TSH, T4TOTAL, FREET4, T3FREE, THYROIDAB in the last 72 hours. Anemia Panel: No results for input(s): VITAMINB12, FOLATE, FERRITIN, TIBC, IRON, RETICCTPCT in the last 72 hours. Urine analysis:    Component Value Date/Time   COLORURINE AMBER (A) 10/23/2016 0113   APPEARANCEUR CLOUDY (A) 10/23/2016 0113   LABSPEC 1.015 10/23/2016 0113   PHURINE 5.0 10/23/2016 0113   GLUCOSEU NEGATIVE 10/23/2016 0113   HGBUR NEGATIVE 10/23/2016 0113   BILIRUBINUR NEGATIVE 10/23/2016 0113   KETONESUR NEGATIVE 10/23/2016 0113  PROTEINUR 100 (A) 10/23/2016 0113   UROBILINOGEN 0.2 09/05/2014 2015   NITRITE NEGATIVE 10/23/2016 0113   LEUKOCYTESUR NEGATIVE 10/23/2016 0113   Sepsis Labs: @LABRCNTIP (procalcitonin:4,lacticidven:4) ) Recent Results (from the past 240 hour(s))  Culture, blood (routine x 2)     Status: None (Preliminary result)   Collection Time: 03/29/17  6:05 AM  Result Value Ref Range Status   Specimen Description   Final    RIGHT ANTECUBITAL BOTTLES DRAWN AEROBIC AND ANAEROBIC   Special Requests   Final    Blood Culture results may not be optimal due to an excessive volume of blood received in culture bottles   Culture   Final    NO GROWTH < 12 HOURS Performed at [facility], [address], [place], [place] [address]    Report Status PENDING  Incomplete  Culture, blood (routine x 2)     Status: None (Preliminary result)   Collection Time: 03/29/17  6:10 AM  Result Value Ref Range Status   Specimen Description   Final    BLOOD LEFT HAND BOTTLES DRAWN AEROBIC AND ANAEROBIC   Special Requests Blood Culture adequate volume  Final   Culture   Final    NO GROWTH < 12 HOURS Performed at [facility], [address], [place], [place] [address]    Report Status PENDING  Incomplete     Radiological Exams on Admission: Dg Chest 2 View  Result Date: 03/29/2017 CLINICAL DATA:  77 year old female with shortness of breath.  EXAM: CHEST  2 VIEW COMPARISON:  Chest radiograph dated 10/26/2016 FINDINGS: There is an area of airspace opacity in the right upper lung field concerning for pneumonia. Clinical correlation is recommended. There is mild cardiomegaly with mild vascular congestion small bilateral pleural effusions may be present. No pneumothorax. Left pectoral pacemaker device. No acute osseous pathology IMPRESSION: 1. Focal opacity in the right upper lung field concerning for pneumonia. 2. Cardiomegaly with mild vascular congestion and probable small bilateral pleural effusions. Electronically Signed   By: Elgie Collard M.D.   On: 03/29/2017 05:57    EKG: Independently reviewed.  Paced rhythm  Assessment/Plan Principal Problem:   HCAP (healthcare-associated pneumonia) Active Problems:   GERD (gastroesophageal reflux disease)   Narcolepsy-evaluated at [facility]   NAFLD (nonalcoholic fatty liver disease)   Obesity-BMI 37   Lower leg DVT (deep venous thromboembolism), chronic (HCC)   Depression   Poorly controlled diabetes mellitus (HCC)    Acute hypoxemic respiratory failure -With infiltrate on chest x-ray we will treat as hospital-acquired pneumonia/lobar pneumonia given recent hospitalization for cellulitis. -Agree with vancomycin and aztreonam started in the emergency department given her penicillin allergy. -Blood/sputum cultures. -Legionella/strep pneumo urine antigen requested.  History of DVT -Continue warfarin  History of narcolepsy/cataplexy -Continue outpatient medications.  GERD -Continue PPI.  Diabetes -Check A1c, place on sliding scale insulin hospitalized.   DVT prophylaxis: Warfarin  Code Status: full code  Family Communication: son and daughter at bedside updated on plan of care and all questions answered  Disposition Plan: pending medical stability  Consults called: None  Admission status: inpatient    Time Spent: 85 minutes  Estela Philip Aspen MD [facility] Pager [phone]  If 7PM-7AM, please contact night-coverage [email or web address] Password [facility]  03/29/2017, 6:22 PM

## 2017-03-29 NOTE — ED Notes (Signed)
Pt's bed linens changed due to purewick leak.

## 2017-03-30 ENCOUNTER — Encounter (HOSPITAL_COMMUNITY): Payer: Self-pay | Admitting: Emergency Medicine

## 2017-03-30 LAB — GLUCOSE, CAPILLARY
GLUCOSE-CAPILLARY: 123 mg/dL — AB (ref 65–99)
GLUCOSE-CAPILLARY: 134 mg/dL — AB (ref 65–99)
GLUCOSE-CAPILLARY: 175 mg/dL — AB (ref 65–99)
GLUCOSE-CAPILLARY: 104 mg/dL — AB (ref 65–99)
Glucose-Capillary: 223 mg/dL — ABNORMAL HIGH (ref 65–99)

## 2017-03-30 LAB — BASIC METABOLIC PANEL
ANION GAP: 11 (ref 5–15)
BUN: 18 mg/dL (ref 6–20)
CHLORIDE: 101 mmol/L (ref 101–111)
CO2: 27 mmol/L (ref 22–32)
Calcium: 8.5 mg/dL — ABNORMAL LOW (ref 8.9–10.3)
Creatinine, Ser: 0.91 mg/dL (ref 0.44–1.00)
GFR calc Af Amer: >60 mL/min (ref >60)
GFR, EST NON AFRICAN AMERICAN: 59 mL/min — AB (ref >60)
GLUCOSE: 151 mg/dL — AB (ref 65–99)
POTASSIUM: 4.1 mmol/L (ref 3.5–5.1)
Sodium: 139 mmol/L (ref 135–145)

## 2017-03-30 LAB — CBC
HEMATOCRIT: 36.4 % (ref 36.0–46.0)
HEMOGLOBIN: 11.4 g/dL — AB (ref 12.0–15.0)
MCH: 28.1 pg (ref 26.0–34.0)
MCHC: 31.3 g/dL (ref 30.0–36.0)
MCV: 89.7 fL (ref 78.0–100.0)
Platelets: 168 10*3/uL (ref 150–400)
RBC: 4.06 MIL/uL (ref 3.87–5.11)
RDW: 14.4 % (ref 11.5–15.5)
WBC: 5.1 10*3/uL (ref 4.0–10.5)

## 2017-03-30 LAB — PROTIME-INR
INR: 2.22
Prothrombin Time: 24.5 seconds — ABNORMAL HIGH (ref 11.4–15.2)

## 2017-03-30 MED ORDER — WARFARIN SODIUM 2.5 MG PO TABS
2.5000 mg | ORAL_TABLET | Freq: Once | ORAL | Status: AC
  Administered 2017-03-30: 2.5 mg via ORAL
  Filled 2017-03-30: qty 1

## 2017-03-30 NOTE — Progress Notes (Signed)
Shiasia Porro Lafontant ZOX:[id number] DOB: 1940-02-27 DOA: 03/29/2017 PCP: [name], MD   Physical Exam: Blood pressure (!) 126/52, pulse 74, temperature 98.3 F (36.8 C), temperature source Oral, resp. rate 20, height 5\' 8"  (1.727 m), weight 101.6 kg (224 lb), SpO2 94 %.Lungs - No rales , no wheeze or ronchi . Heart - Reg Rhythm, no S3, S4, No H T R.   Investigations:  Recent Results (from the past 240 hour(s))  Culture, blood (routine x 2)     Status: None (Preliminary result)   Collection Time: 03/29/17  6:05 AM  Result Value Ref Range Status   Specimen Description   Final    RIGHT ANTECUBITAL BOTTLES DRAWN AEROBIC AND ANAEROBIC   Special Requests   Final    Blood Culture results may not be optimal due to an excessive volume of blood received in culture bottles   Culture   Final    NO GROWTH < 24 HOURS Performed at [facility], [address] [place], [place] [address]    Report Status PENDING  Incomplete  Culture, blood (routine x 2)     Status: None (Preliminary result)   Collection Time: 03/29/17  6:10 AM  Result Value Ref Range Status   Specimen Description   Final    BLOOD LEFT HAND BOTTLES DRAWN AEROBIC AND ANAEROBIC   Special Requests Blood Culture adequate volume  Final   Culture   Final    NO GROWTH < 24 HOURS Performed at [facility], [address], [place], [place] [address]    Report Status PENDING  Incomplete     Basic Metabolic Panel: Recent Labs    03/29/17 0604 03/30/17 0634  NA 132* 139  K 4.3 4.1  CL 96* 101  CO2 25 27  GLUCOSE 127* 151*  BUN 22* 18  CREATININE 1.00 0.91  CALCIUM 8.4* 8.5*   Liver Function Tests: Recent Labs    03/29/17 0604  AST 22  ALT 15  ALKPHOS 51  BILITOT 0.7  PROT 7.1  ALBUMIN 3.0*     CBC: Recent Labs    03/29/17 0604 03/30/17 0634  WBC 5.3 5.1  NEUTROABS 3.8  --   HGB 12.2 11.4*  HCT 38.2 36.4  MCV 89.3 89.7  PLT 191 168    Dg Chest 2 View  Result Date: 03/29/2017 CLINICAL DATA:  77 year old  female with shortness of breath. EXAM: CHEST  2 VIEW COMPARISON:  Chest radiograph dated 10/26/2016 FINDINGS: There is an area of airspace opacity in the right upper lung field concerning for pneumonia. Clinical correlation is recommended. There is mild cardiomegaly with mild vascular congestion small bilateral pleural effusions may be present. No pneumothorax. Left pectoral pacemaker device. No acute osseous pathology IMPRESSION: 1. Focal opacity in the right upper lung field concerning for pneumonia. 2. Cardiomegaly with mild vascular congestion and probable small bilateral pleural effusions. Electronically Signed   By: Elgie Collard M.D.   On: 03/29/2017 05:57      Medications:   Impression:  Principal Problem:   HCAP (healthcare-associated pneumonia) Active Problems:   GERD (gastroesophageal reflux disease)   Narcolepsy-evaluated at [facility]   NAFLD (nonalcoholic fatty liver disease)   Obesity-BMI 37   Lower leg DVT (deep venous thromboembolism), chronic (HCC)   Depression   Poorly controlled diabetes mellitus (HCC)     Plan:Continue Vanco & Azrtreonam , duonebs Q I D. & Physical therapy   Consultants:    Procedures   Antibiotics:  Time spent:30 min.   LOS: 1 day   Cecily Lawhorne M   03/30/2017, 10:57 AM

## 2017-03-30 NOTE — Progress Notes (Signed)
ANTICOAGULATION CONSULT NOTE   Pharmacy Consult for Warfarin (home med) Indication: VTE treatment  Allergies  Allergen Reactions  . Codeine Anxiety  . Compazine Anaphylaxis  . Other Anaphylaxis, Rash and Other (See Comments)    Uncoded Allergy. Allergen: INOVAR Uncoded Allergy. Allergen: ALL ADHESIVES Uncoded Allergy. Allergen: SILK SUTURE Uncoded Allergy. Allergen: merthiolate Thermasol  . Penicillins Shortness Of Breath and Rash    Has patient had a PCN reaction causing immediate rash, facial/tongue/throat swelling, SOB or lightheadedness with hypotension: Yes Has patient had a PCN reaction causing severe rash involving mucus membranes or skin necrosis: No Has patient had a PCN reaction that required hospitalization No Has patient had a PCN reaction occurring within the last 10 years: Yes If all of the above answers are "NO", then may proceed with Cephalosporin use.   Marland Kitchen Morphine And Related Other (See Comments)    Patient states that it makes her lose her state of mind.   . Demerol [Meperidine] Rash   Patient Measurements: Height: 5\' 8"  (172.7 cm) Weight: 224 lb (101.6 kg) IBW/kg (Calculated) : 63.9  Vital Signs:    Labs: Recent Labs    03/29/17 0604 03/29/17 0605 03/30/17 0634  HGB 12.2  --  11.4*  HCT 38.2  --  36.4  PLT 191  --  168  LABPROT  --  24.4* 24.5*  INR  --  2.22 2.22  CREATININE 1.00  --  0.91    Estimated Creatinine Clearance: 64.6 mL/min (by C-G formula based on SCr of 0.91 mg/dL).   Medical History: Past Medical History:  Diagnosis Date  . Aortic stenosis   . Cataplexy   . Diastolic heart failure (HCC)   . Essential hypertension   . GERD (gastroesophageal reflux disease)   . Hiatal hernia   . History of recurrent deep vein thrombosis (DVT)   . Mitral stenosis   . Narcolepsy   . Neuropathy   . Type 2 diabetes mellitus (HCC)    Medications:  Medications Prior to Admission  Medication Sig Dispense Refill Last Dose  . azithromycin  (ZITHROMAX) 250 MG tablet Take 250 mg by mouth daily.    03/28/2017 at Unknown time  . BYDUREON 2 MG PEN Inject 2 mg into the skin once a week.  5 Past Week at Unknown time  . carvedilol (COREG) 3.125 MG tablet Take 1 tablet (3.125 mg total) by mouth 2 (two) times daily with a meal. 60 tablet 6 03/28/2017 at 2200  . celecoxib (CELEBREX) 200 MG capsule Take 200 mg by mouth daily. For arthritis   03/28/2017 at Unknown time  . Cholecalciferol (VITAMIN D3) 3000 UNITS TABS Take 1 capsule by mouth daily.   03/28/2017 at Unknown time  . clomiPRAMINE (ANAFRANIL) 25 MG capsule Take 25 mg by mouth 4 (four) times daily.    03/28/2017 at Unknown time  . clonazePAM (KLONOPIN) 1 MG tablet Take 1 mg by mouth at bedtime.    03/28/2017 at Unknown time  . Coenzyme Q10 (CO Q10) 200 MG CAPS Take 2 capsules by mouth daily.    03/28/2017 at Unknown time  . DULoxetine (CYMBALTA) 60 MG capsule Take 60 mg by mouth daily.     03/28/2017 at Unknown time  . ferrous sulfate 325 (65 FE) MG tablet Take 325 mg by mouth daily with breakfast.   03/28/2017 at Unknown time  . furosemide (LASIX) 40 MG tablet Take 20 mg by mouth 2 (two) times daily.   5 03/28/2017 at Unknown time  . Hyaluronic  Acid-Vitamin C (HYALURONIC ACID PO) Take 100 mg by mouth daily.   03/28/2017 at Unknown time  . HYDROcodone-acetaminophen (NORCO/VICODIN) 5-325 MG tablet Take 1 tablet by mouth every 4 (four) hours as needed. 15 tablet 0 03/28/2017 at Unknown time  . insulin glargine (LANTUS) 100 unit/mL SOPN Inject 24 Units into the skin at bedtime.    03/28/2017 at Unknown time  . Insulin Glargine (TOUJEO MAX SOLOSTAR) 300 UNIT/ML SOPN Inject 24 Units into the skin every evening.   03/28/2017 at Unknown time  . magnesium oxide (MAG-OX) 400 MG tablet Take 400 mg by mouth daily.   03/28/2017 at Unknown time  . metFORMIN (GLUCOPHAGE) 500 MG tablet Take 500 mg by mouth 2 (two) times daily with a meal.     03/28/2017 at Unknown time  . mirtazapine (REMERON) 15 MG tablet Take 15 mg  by mouth at bedtime.     03/28/2017 at Unknown time  . Multiple Vitamin (MULTIVITAMIN WITH MINERALS) TABS tablet Take 1 tablet by mouth daily.   03/28/2017 at Unknown time  . potassium chloride (K-DUR) 10 MEQ tablet Take 1 tablet (10 mEq total) by mouth daily. 90 tablet 3 03/28/2017 at Unknown time  . Probiotic Product (PROBIOTIC ADVANCED PO) Take 1 capsule by mouth daily.   03/28/2017 at Unknown time  . QUEtiapine (SEROQUEL) 100 MG tablet Take 100 mg by mouth at bedtime.   03/28/2017 at Unknown time  . warfarin (COUMADIN) 5 MG tablet Take 2.5-5 mg by mouth every evening. Starting 10/29/2016 take 2.5 mg alternating with 5 mg daily    03/28/2017 at 2200  . cephALEXin (KEFLEX) 500 MG capsule Take 1 capsule (500 mg total) by mouth every 6 (six) hours. (Patient not taking: Reported on 03/25/2017) 40 capsule 0 Not Taking at Unknown time   Assessment: 77yo female on chronic Coumadin for h/o DVT.  INR therapeutic on admission.  Home dose reportedly 2.5mg  alternating w/ 5mg , last dose taken was 2/22 per report.   Goal of Therapy:  INR 2-3 Monitor platelets by anticoagulation protocol: Yes   Plan: Coumadin 2.5mg  today x 1 INR daily  Genessis Flanary A 03/30/2017,9:11 AM

## 2017-03-30 NOTE — Progress Notes (Signed)
HCAP ON VAnc & Aztreonam Rebecca [name]:[id number] DOB: Mar 21, 1940 DOA: 03/29/2017 PCP: [name], MD   Physical Exam: Blood pressure (!) 126/52, pulse 74, temperature 98.3 F (36.8 C), temperature source Oral, resp. rate 20, height 5\' 8"  (1.727 m), weight 101.6 kg (224 lb), SpO2 94 %. Lungs- no rales wheeeze or ronchi . Heart - Reg Rhythm - No s3 s4 No H T or R    Investigations:  Recent Results (from the past 240 hour(s))  Culture, blood (routine x 2)     Status: None (Preliminary result)   Collection Time: 03/29/17  6:05 AM  Result Value Ref Range Status   Specimen Description   Final    RIGHT ANTECUBITAL BOTTLES DRAWN AEROBIC AND ANAEROBIC   Special Requests   Final    Blood Culture results may not be optimal due to an excessive volume of blood received in culture bottles   Culture   Final    NO GROWTH < 24 HOURS Performed at [facility], [address], [place], [place] [address]    Report Status PENDING  Incomplete  Culture, blood (routine x 2)     Status: None (Preliminary result)   Collection Time: 03/29/17  6:10 AM  Result Value Ref Range Status   Specimen Description   Final    BLOOD LEFT HAND BOTTLES DRAWN AEROBIC AND ANAEROBIC   Special Requests Blood Culture adequate volume  Final   Culture   Final    NO GROWTH < 24 HOURS Performed at [facility], [address] [place], [place] [address]    Report Status PENDING  Incomplete     Basic Metabolic Panel: Recent Labs    03/29/17 0604 03/30/17 0634  NA 132* 139  K 4.3 4.1  CL 96* 101  CO2 25 27  GLUCOSE 127* 151*  BUN 22* 18  CREATININE 1.00 0.91  CALCIUM 8.4* 8.5*   Liver Function Tests: Recent Labs    03/29/17 0604  AST 22  ALT 15  ALKPHOS 51  BILITOT 0.7  PROT 7.1  ALBUMIN 3.0*     CBC: Recent Labs    03/29/17 0604 03/30/17 0634  WBC 5.3 5.1  NEUTROABS 3.8  --   HGB 12.2 11.4*  HCT 38.2 36.4  MCV 89.3 89.7  PLT 191 168    Dg Chest 2 View  Result Date:  03/29/2017 CLINICAL DATA:  77 year old female with shortness of breath. EXAM: CHEST  2 VIEW COMPARISON:  Chest radiograph dated 10/26/2016 FINDINGS: There is an area of airspace opacity in the right upper lung field concerning for pneumonia. Clinical correlation is recommended. There is mild cardiomegaly with mild vascular congestion small bilateral pleural effusions may be present. No pneumothorax. Left pectoral pacemaker device. No acute osseous pathology IMPRESSION: 1. Focal opacity in the right upper lung field concerning for pneumonia. 2. Cardiomegaly with mild vascular congestion and probable small bilateral pleural effusions. Electronically Signed   By: Elgie Collard M.D.   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

## 2017-03-31 LAB — GLUCOSE, CAPILLARY
GLUCOSE-CAPILLARY: 100 mg/dL — AB (ref 65–99)
GLUCOSE-CAPILLARY: 122 mg/dL — AB (ref 65–99)
Glucose-Capillary: 136 mg/dL — ABNORMAL HIGH (ref 65–99)
Glucose-Capillary: 183 mg/dL — ABNORMAL HIGH (ref 65–99)

## 2017-03-31 LAB — PROTIME-INR
INR: 2.19
Prothrombin Time: 24.1 seconds — ABNORMAL HIGH (ref 11.4–15.2)

## 2017-03-31 LAB — EXPECTORATED SPUTUM ASSESSMENT W GRAM STAIN, RFLX TO RESP C

## 2017-03-31 LAB — STREP PNEUMONIAE URINARY ANTIGEN: STREP PNEUMO URINARY ANTIGEN: NEGATIVE

## 2017-03-31 LAB — EXPECTORATED SPUTUM ASSESSMENT W REFEX TO RESP CULTURE

## 2017-03-31 MED ORDER — DIPHENHYDRAMINE HCL 50 MG/ML IJ SOLN
25.0000 mg | Freq: Four times a day (QID) | INTRAMUSCULAR | Status: DC | PRN
  Administered 2017-03-31: 25 mg via INTRAMUSCULAR
  Filled 2017-03-31: qty 1

## 2017-03-31 MED ORDER — WARFARIN SODIUM 5 MG PO TABS
5.0000 mg | ORAL_TABLET | Freq: Once | ORAL | Status: AC
  Administered 2017-03-31: 5 mg via ORAL
  Filled 2017-03-31: qty 1

## 2017-03-31 MED ORDER — IPRATROPIUM-ALBUTEROL 0.5-2.5 (3) MG/3ML IN SOLN
3.0000 mL | Freq: Four times a day (QID) | RESPIRATORY_TRACT | Status: DC
  Administered 2017-03-31 – 2017-04-03 (×12): 3 mL via RESPIRATORY_TRACT
  Filled 2017-03-31 (×12): qty 3

## 2017-03-31 NOTE — Progress Notes (Signed)
RUL infiltrate , generalized weakness. Dylen Boven Galea LHT:[id number] DOB: [birth year] DOA: 03/29/2017 PCP: [name], MD   Physical Exam: Blood pressure (!) 136/113, pulse 80, temperature 98.3 F (36.8 C), temperature source Oral, resp. rate 20, height 5\' 8"  (1.727 m), weight 101.6 kg (224 lb), SpO2 96 %.Lunggs- no rales , wheeeze or ronchi . Heart - RR No S3, S4. No H T or R.    Investigations:  Recent Results (from the past 240 hour(s))  Culture, blood (routine x 2)     Status: None (Preliminary result)   Collection Time: 03/29/17  6:05 AM  Result Value Ref Range Status   Specimen Description   Final    RIGHT ANTECUBITAL BOTTLES DRAWN AEROBIC AND ANAEROBIC   Special Requests   Final    Blood Culture results may not be optimal due to an excessive volume of blood received in culture bottles   Culture   Final    NO GROWTH 2 DAYS Performed at [facility], [address] [place], [place] [address]    Report Status PENDING  Incomplete  Culture, blood (routine x 2)     Status: None (Preliminary result)   Collection Time: 03/29/17  6:10 AM  Result Value Ref Range Status   Specimen Description   Final    BLOOD LEFT HAND BOTTLES DRAWN AEROBIC AND ANAEROBIC   Special Requests Blood Culture adequate volume  Final   Culture   Final    NO GROWTH 2 DAYS Performed at [facility], [address] [place], [place] [address]    Report Status PENDING  Incomplete     Basic Metabolic Panel: Recent Labs    03/29/17 0604 03/30/17 0634  NA 132* 139  K 4.3 4.1  CL 96* 101  CO2 25 27  GLUCOSE 127* 151*  BUN 22* 18  CREATININE 1.00 0.91  CALCIUM 8.4* 8.5*   Liver Function Tests: Recent Labs    03/29/17 0604  AST 22  ALT 15  ALKPHOS 51  BILITOT 0.7  PROT 7.1  ALBUMIN 3.0*     CBC: Recent Labs    03/29/17 0604 03/30/17 0634  WBC 5.3 5.1  NEUTROABS 3.8  --   HGB 12.2 11.4*  HCT 38.2 36.4  MCV 89.3 89.7  PLT 191 168    No results found.    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 coumaadin , Phusical therapy for strengthening ambulation . OOB to chair 3 x daY  Consultants:    Procedures   Antibiotics:         Time spent:30    LOS: 2 days   Braedyn Riggle M   03/31/2017, 1:14 PM

## 2017-03-31 NOTE — Progress Notes (Signed)
PT Cancellation Note  Patient Details Name: Rebecca [name] MRN: [id number] DOB: 08/14/1940   Cancelled Treatment:    Reason Eval/Treat Not Completed: Other (comment).  Made three attempts to see pt with conflicts for respiratory therapy, dinner and needing nursing care.  Will try again in the AM.   Ivar Drape 03/31/2017, 5:47 PM   5:48 PM, 03/31/17 Samul Dada, PT, MS Physical Therapist - [place] [phone] (Office)

## 2017-03-31 NOTE — Progress Notes (Signed)
ANTICOAGULATION CONSULT NOTE   Pharmacy Consult for Warfarin (home med) Indication: VTE treatment  Allergies  Allergen Reactions  . Codeine Anxiety  . Compazine Anaphylaxis  . Other Anaphylaxis, Rash and Other (See Comments)    Uncoded Allergy. Allergen: INOVAR Uncoded Allergy. Allergen: ALL ADHESIVES Uncoded Allergy. Allergen: SILK SUTURE Uncoded Allergy. Allergen: merthiolate Thermasol  . Penicillins Shortness Of Breath and Rash    Has patient had a PCN reaction causing immediate rash, facial/tongue/throat swelling, SOB or lightheadedness with hypotension: Yes Has patient had a PCN reaction causing severe rash involving mucus membranes or skin necrosis: No Has patient had a PCN reaction that required hospitalization No Has patient had a PCN reaction occurring within the last 10 years: Yes If all of the above answers are "NO", then may proceed with Cephalosporin use.   Marland Kitchen Morphine And Related Other (See Comments)    Patient states that it makes her lose her state of mind.   . Demerol [Meperidine] Rash   Patient Measurements: Height: 5\' 8"  (172.7 cm) Weight: 224 lb (101.6 kg) IBW/kg (Calculated) : 63.9  Vital Signs: BP: 136/113 (02/25 0602) Pulse Rate: 80 (02/25 0602)  Labs: Recent Labs    03/29/17 0604 03/29/17 0605 03/30/17 0634 03/31/17 0537  HGB 12.2  --  11.4*  --   HCT 38.2  --  36.4  --   PLT 191  --  168  --   LABPROT  --  24.4* 24.5* 24.1*  INR  --  2.22 2.22 2.19  CREATININE 1.00  --  0.91  --     Estimated Creatinine Clearance: 64.6 mL/min (by C-G formula based on SCr of 0.91 mg/dL).   Medical History: Past Medical History:  Diagnosis Date  . Aortic stenosis   . Cataplexy   . Diastolic heart failure (HCC)   . Essential hypertension   . GERD (gastroesophageal reflux disease)   . Hiatal hernia   . History of recurrent deep vein thrombosis (DVT)   . Mitral stenosis   . Narcolepsy   . Neuropathy   . Type 2 diabetes mellitus (HCC)     Medications:  Medications Prior to Admission  Medication Sig Dispense Refill Last Dose  . azithromycin (ZITHROMAX) 250 MG tablet Take 250 mg by mouth daily.    03/28/2017 at Unknown time  . BYDUREON 2 MG PEN Inject 2 mg into the skin once a week.  5 Past Week at Unknown time  . carvedilol (COREG) 3.125 MG tablet Take 1 tablet (3.125 mg total) by mouth 2 (two) times daily with a meal. 60 tablet 6 03/28/2017 at 2200  . celecoxib (CELEBREX) 200 MG capsule Take 200 mg by mouth daily. For arthritis   03/28/2017 at Unknown time  . Cholecalciferol (VITAMIN D3) 3000 UNITS TABS Take 1 capsule by mouth daily.   03/28/2017 at Unknown time  . clomiPRAMINE (ANAFRANIL) 25 MG capsule Take 25 mg by mouth 4 (four) times daily.    03/28/2017 at Unknown time  . clonazePAM (KLONOPIN) 1 MG tablet Take 1 mg by mouth at bedtime.    03/28/2017 at Unknown time  . Coenzyme Q10 (CO Q10) 200 MG CAPS Take 2 capsules by mouth daily.    03/28/2017 at Unknown time  . DULoxetine (CYMBALTA) 60 MG capsule Take 60 mg by mouth daily.     03/28/2017 at Unknown time  . ferrous sulfate 325 (65 FE) MG tablet Take 325 mg by mouth daily with breakfast.   03/28/2017 at Unknown time  . furosemide (  LASIX) 40 MG tablet Take 20 mg by mouth 2 (two) times daily.   5 03/28/2017 at Unknown time  . Hyaluronic Acid-Vitamin C (HYALURONIC ACID PO) Take 100 mg by mouth daily.   03/28/2017 at Unknown time  . HYDROcodone-acetaminophen (NORCO/VICODIN) 5-325 MG tablet Take 1 tablet by mouth every 4 (four) hours as needed. 15 tablet 0 03/28/2017 at Unknown time  . insulin glargine (LANTUS) 100 unit/mL SOPN Inject 24 Units into the skin at bedtime.    03/28/2017 at Unknown time  . Insulin Glargine (TOUJEO MAX SOLOSTAR) 300 UNIT/ML SOPN Inject 24 Units into the skin every evening.   03/28/2017 at Unknown time  . magnesium oxide (MAG-OX) 400 MG tablet Take 400 mg by mouth daily.   03/28/2017 at Unknown time  . metFORMIN (GLUCOPHAGE) 500 MG tablet Take 500 mg by mouth 2  (two) times daily with a meal.     03/28/2017 at Unknown time  . mirtazapine (REMERON) 15 MG tablet Take 15 mg by mouth at bedtime.     03/28/2017 at Unknown time  . Multiple Vitamin (MULTIVITAMIN WITH MINERALS) TABS tablet Take 1 tablet by mouth daily.   03/28/2017 at Unknown time  . potassium chloride (K-DUR) 10 MEQ tablet Take 1 tablet (10 mEq total) by mouth daily. 90 tablet 3 03/28/2017 at Unknown time  . Probiotic Product (PROBIOTIC ADVANCED PO) Take 1 capsule by mouth daily.   03/28/2017 at Unknown time  . QUEtiapine (SEROQUEL) 100 MG tablet Take 100 mg by mouth at bedtime.   03/28/2017 at Unknown time  . warfarin (COUMADIN) 5 MG tablet Take 2.5-5 mg by mouth every evening. Starting 10/29/2016 take 2.5 mg alternating with 5 mg daily    03/28/2017 at 2200  . cephALEXin (KEFLEX) 500 MG capsule Take 1 capsule (500 mg total) by mouth every 6 (six) hours. (Patient not taking: Reported on 03/25/2017) 40 capsule 0 Not Taking at Unknown time   Assessment: 77yo female on chronic Coumadin for h/o DVT.  INR therapeutic on admission.  Home dose reportedly 2.5mg  alternating w/ 5mg .    Goal of Therapy:  INR 2-3 Monitor platelets by anticoagulation protocol: Yes   Plan: Coumadin 5mg  today x 1 INR daily  Ruhama Lehew A 03/31/2017,11:14 AM

## 2017-04-01 LAB — PROTIME-INR
INR: 2.16
Prothrombin Time: 23.9 seconds — ABNORMAL HIGH (ref 11.4–15.2)

## 2017-04-01 LAB — HIV ANTIBODY (ROUTINE TESTING W REFLEX): HIV SCREEN 4TH GENERATION: NONREACTIVE

## 2017-04-01 LAB — GLUCOSE, CAPILLARY
GLUCOSE-CAPILLARY: 113 mg/dL — AB (ref 65–99)
Glucose-Capillary: 220 mg/dL — ABNORMAL HIGH (ref 65–99)

## 2017-04-01 LAB — HEMOGLOBIN A1C
HEMOGLOBIN A1C: 8.2 % — AB (ref 4.8–5.6)
MEAN PLASMA GLUCOSE: 189 mg/dL

## 2017-04-01 LAB — LEGIONELLA PNEUMOPHILA SEROGP 1 UR AG: L. PNEUMOPHILA SEROGP 1 UR AG: NEGATIVE

## 2017-04-01 MED ORDER — WARFARIN SODIUM 2.5 MG PO TABS
2.5000 mg | ORAL_TABLET | Freq: Once | ORAL | Status: AC
  Administered 2017-04-01: 2.5 mg via ORAL
  Filled 2017-04-01: qty 1

## 2017-04-01 MED ORDER — WARFARIN SODIUM 2.5 MG PO TABS: 2.5000 mg | ORAL_TABLET | Freq: Once | ORAL | Status: DC

## 2017-04-01 NOTE — Evaluation (Signed)
Physical Therapy Evaluation Patient Details Name: Rebecca [name] MRN: [id number] DOB: 1940/06/07 Today's Date: 04/01/2017   History of Present Illness  Rebecca [name] is a 77 y.o. female with medical comorbidities including hypertension, diabetes, GERD, diastolic heart failure, cataplexy, DVT on warfarin presents to the hospital today with cough and shortness of breath.  This began about 3 days ago.  Shortness of breath worsened overnight which is the reason for presentation.  She has been having a cough productive of brownish/reddish sputum.  Upon arrival to the ED vital signs are stable, labs are unremarkable, chest x-ray shows a focal opacity in the right upper lung field concerning for pneumonia, flu PCR is negative.  Admission is requested for further evaluation and management.    Clinical Impression  Patient presents up in chair, demonstrates slow labored movement when taking steps with occasional dragging of RLE due to c/o severe right knee pain when weightbearing, transferred to raised toilet seat in bathroom for a bowel movement, attempted to ambulate back to bedside, but had to transfer to wheelchair for most of distance due to poor standing balance and pain in bilateral knees, right worse than left.  Patient will benefit from continued physical therapy in hospital and recommended venue below to increase strength, balance, endurance for safe ADLs and gait.    Follow Up Recommendations SNF;Supervision for mobility/OOB    Equipment Recommendations  None recommended by PT    Recommendations for Other Services       Precautions / Restrictions Precautions Precautions: Fall Restrictions Weight Bearing Restrictions: No LLE Weight Bearing: Weight bearing as tolerated      Mobility  Bed Mobility Overal bed mobility: Needs Assistance Bed Mobility: Supine to Sit;Sit to Supine     Supine to sit: Min guard Sit to supine: Min guard   General bed mobility comments: has to use  siderail  Transfers Overall transfer level: Needs assistance Equipment used: Rolling walker (2 wheeled) Transfers: Sit to/from [employer] Sit to Stand: Min assist Stand pivot transfers: Min assist       General transfer comment: requires much time and c/o right knee pain  Ambulation/Gait Ambulation/Gait assistance: Min assist Ambulation Distance (Feet): 15 Feet Assistive device: Rolling walker (2 wheeled) Gait Pattern/deviations: Decreased step length - right;Decreased step length - left;Decreased stance time - right;Decreased stride length;Antalgic   Gait velocity interpretation: Below normal speed for age/gender General Gait Details: demonstrates slow labored cadence with difficulty advancing RLE, sometimes drag due to c/o severe right knee pain, no loss of balance, frequent sitting rest breaks  Stairs            Wheelchair Mobility    Modified Rankin (Stroke Patients Only)       Balance Overall balance assessment: Needs assistance Sitting-balance support: Feet supported;No upper extremity supported Sitting balance-Leahy Scale: Good     Standing balance support: Bilateral upper extremity supported;During functional activity Standing balance-Leahy Scale: Fair Standing balance comment: using RW                             Pertinent Vitals/Pain Pain Assessment: Faces Faces Pain Scale: Hurts even more Pain Location: bilateral knees, mostly right knee when walking Pain Descriptors / Indicators: Aching;Discomfort Pain Intervention(s): Limited activity within patient's tolerance;Monitored during session    Home Living Family/patient expects to be discharged to:: Private residence Living Arrangements: Children(son) Available Help at Discharge: Family;Available PRN/intermittently Type of Home: House Home Access: Stairs to enter Entrance  Stairs-Rails: None Entrance Stairs-Number of Steps: 1 Home Layout: One level Home Equipment:  Walker - 2 wheels;Cane - single point;Shower seat;Wheelchair - manual      Prior Function Level of Independence: Independent with assistive device(s)   Gait / Transfers Assistance Needed: Ambulates with RW household and short community distances           Higher education careers adviser        Extremity/Trunk Assessment   Upper Extremity Assessment Upper Extremity Assessment: Generalized weakness    Lower Extremity Assessment Lower Extremity Assessment: Generalized weakness    Cervical / Trunk Assessment Cervical / Trunk Assessment: Normal  Communication   Communication: No difficulties  Cognition Arousal/Alertness: Awake/alert Behavior During Therapy: WFL for tasks assessed/performed Overall Cognitive Status: Within Functional Limits for tasks assessed                                        General Comments      Exercises     Assessment/Plan    PT Assessment Patient needs continued PT services  PT Problem List Decreased strength;Decreased activity tolerance;Decreased balance;Decreased mobility       PT Treatment Interventions Gait training;Functional mobility training;Therapeutic activities;Stair training;Patient/family education    PT Goals (Current goals can be found in the Care Plan section)  Acute Rehab PT Goals Patient Stated Goal: return home, does not want to go to a SNF PT Goal Formulation: With patient Time For Goal Achievement: 04/15/17 Potential to Achieve Goals: Good    Frequency Min 3X/week   Barriers to discharge        Co-evaluation               AM-PAC PT "6 Clicks" Daily Activity  Outcome Measure Difficulty turning over in bed (including adjusting bedclothes, sheets and blankets)?: None Difficulty moving from lying on back to sitting on the side of the bed? : None Difficulty sitting down on and standing up from a chair with arms (e.g., wheelchair, bedside commode, etc,.)?: A Little Help needed moving to and from a bed to  chair (including a wheelchair)?: A Little Help needed walking in hospital room?: A Lot Help needed climbing 3-5 steps with a railing? : A Lot 6 Click Score: 18    End of Session   Activity Tolerance: Patient tolerated treatment well;Patient limited by pain;Patient limited by fatigue Patient left: in chair;with call bell/phone within reach Nurse Communication: Mobility status PT Visit Diagnosis: Muscle weakness (generalized) (M62.81);Unsteadiness on feet (R26.81);Other abnormalities of gait and mobility (R26.89)    Time: 1610-9604 PT Time Calculation (min) (ACUTE ONLY): 40 min   Charges:   PT Evaluation $PT Eval Moderate Complexity: 1 Mod PT Treatments $Therapeutic Activity: 38-52 mins   PT G Codes:        12:24 PM, April 20, 2017 Ocie Bob, MPT Physical Therapist with [facility] [phone] office [phone] mobile phone

## 2017-04-01 NOTE — Care Management Note (Signed)
Case Management Note  Patient Details  Name: Rebecca [name] MRN: [id number] Date of Birth: [birth year]  Subjective/Objective:    Admitted with HCAP. Pt from home, lives with son. She has [facility] services pta. She is on oxygen acutely, had a neb machine but gave it away.                 Action/Plan: Pt anticipates DC home with resumption of HH services. PT has recommended SNF. Pt refuses and wants to return home. CM has discussed plan with her son [name]) who understands we are unable to place pt in STR if she is not agreeable, he says [name] has [facility] POA, we discuss that does not come into play until [name] is unable competent and able to make decisions. Son and daughter will attempt to convince pt to agree to STR. CM will cont to follow. She may need home O2 assessment, may need neb machine depending on needs at DC.   Expected Discharge Date:     04/02/2016             Expected Discharge Plan:  Home w Home Health Services  In-House Referral:  NA  Discharge planning Services  CM Consult  Post Acute Care Choice:  Home Health Choice offered to:  Patient  HH Arranged:  RN, PT [place] Agency:  [facility]  Status of Service:  In process, will continue to follow  If discussed at Long Length of Stay Meetings, dates discussed:    Additional Comments:  Malcolm Metro, RN 04/01/2017, 3:21 PM

## 2017-04-01 NOTE — Plan of Care (Signed)
  Acute Rehab PT Goals(only PT should resolve) Pt Will Go Supine/Side To Sit 04/01/2017 1225 - Progressing by Ocie Bob, PT Flowsheets Taken 04/01/2017 1225  Pt will go Supine/Side to Sit with modified independence Patient Will Transfer Sit To/From Stand 04/01/2017 1225 - Progressing by Ocie Bob, PT Flowsheets Taken 04/01/2017 1225  Patient will transfer sit to/from stand with supervision Pt Will Transfer Bed To Chair/Chair To Bed 04/01/2017 1225 - Progressing by Ocie Bob, PT Flowsheets Taken 04/01/2017 1225  Pt will Transfer Bed to Chair/Chair to Bed with supervision Pt Will Ambulate 04/01/2017 1225 - Progressing by Ocie Bob, PT Flowsheets Taken 04/01/2017 1225  Pt will Ambulate 25 feet;with supervision;with rolling walker  12:26 PM, 04/01/17 Ocie Bob, MPT Physical Therapist with [facility] [phone] office [phone] mobile phone

## 2017-04-01 NOTE — Progress Notes (Signed)
Less wheeze today on Rebecca [name]:[id number] DOB: August 01, 1940 DOA: 03/29/2017 PCP: [name], MD   Physical Exam: Blood pressure (!) 157/97, pulse 90, temperature 97.8 F (36.6 C), temperature source Oral, resp. rate 18, height 5\' 8"  (1.727 [name]), weight 101.6 kg (224 lb), SpO2 95 %.Lungs - Mild end expiratory wheeze , no rales , no ronchi . Heart - RR No S3, S4   Investigations:  Recent Results (from the past 240 hour(s))  Culture, blood (routine x 2)     Status: None (Preliminary result)   Collection Time: 03/29/17  6:05 AM  Result Value Ref Range Status   Specimen Description   Final    RIGHT ANTECUBITAL BOTTLES DRAWN AEROBIC AND ANAEROBIC   Special Requests   Final    Blood Culture results may not be optimal due to an excessive volume of blood received in culture bottles   Culture   Final    NO GROWTH 3 DAYS Performed at [facility], [address] [place], [place] [address]    Report Status PENDING  Incomplete  Culture, blood (routine x 2)     Status: None (Preliminary result)   Collection Time: 03/29/17  6:10 AM  Result Value Ref Range Status   Specimen Description   Final    BLOOD LEFT HAND BOTTLES DRAWN AEROBIC AND ANAEROBIC   Special Requests Blood Culture adequate volume  Final   Culture   Final    NO GROWTH 3 DAYS Performed at [facility], [address], [place], [place] [address]    Report Status PENDING  Incomplete  Culture, sputum-assessment     Status: None   Collection Time: 03/29/17  3:07 PM  Result Value Ref Range Status   Specimen Description EXPECTORATED SPUTUM  Final   Special Requests NONE  Final   Sputum evaluation   Final    THIS SPECIMEN IS ACCEPTABLE FOR SPUTUM CULTURE Performed at [facility], [address], [place], [place] [address]    Report Status 03/31/2017 FINAL  Final     Basic Metabolic Panel: Recent Labs    03/30/17 0634  NA 139  K 4.1  CL 101  CO2 27  GLUCOSE 151*  BUN 18  CREATININE 0.91  CALCIUM 8.5*    Liver Function Tests: No results for input(s): AST, ALT, ALKPHOS, BILITOT, PROT, ALBUMIN in the last 72 hours.   CBC: Recent Labs    03/30/17 0634  WBC 5.1  HGB 11.4*  HCT 36.4  MCV 89.7  PLT 168    No results found.    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   LOS: 3 days   Rebecca [name]   04/01/2017, 12:50 PM

## 2017-04-01 NOTE — Progress Notes (Addendum)
ANTICOAGULATION CONSULT NOTE   Pharmacy Consult for Warfarin (home med) Indication: VTE treatment  Allergies  Allergen Reactions  . Codeine Anxiety  . Compazine Anaphylaxis  . Other Anaphylaxis, Rash and Other (See Comments)    Uncoded Allergy. Allergen: INOVAR Uncoded Allergy. Allergen: ALL ADHESIVES Uncoded Allergy. Allergen: SILK SUTURE Uncoded Allergy. Allergen: merthiolate Thermasol  . Penicillins Shortness Of Breath and Rash    Has patient had a PCN reaction causing immediate rash, facial/tongue/throat swelling, SOB or lightheadedness with hypotension: Yes Has patient had a PCN reaction causing severe rash involving mucus membranes or skin necrosis: No Has patient had a PCN reaction that required hospitalization No Has patient had a PCN reaction occurring within the last 10 years: Yes If all of the above answers are "NO", then may proceed with Cephalosporin use.   Marland Kitchen Morphine And Related Other (See Comments)    Patient states that it makes her lose her state of mind.   . Demerol [Meperidine] Rash   Patient Measurements: Height: 5\' 8"  (172.7 cm) Weight: 224 lb (101.6 kg) IBW/kg (Calculated) : 63.9  Vital Signs: Temp: 97.8 F (36.6 C) (02/25 2124) Temp Source: Oral (02/25 2124) BP: 159/53 (02/25 2124) Pulse Rate: 66 (02/25 2124)  Labs: Recent Labs    03/30/17 0634 03/31/17 0537 04/01/17 0539  HGB 11.4*  --   --   HCT 36.4  --   --   PLT 168  --   --   LABPROT 24.5* 24.1* 23.9*  INR 2.22 2.19 2.16  CREATININE 0.91  --   --     Estimated Creatinine Clearance: 64.6 mL/min (by C-G formula based on SCr of 0.91 mg/dL).   Medical History: Past Medical History:  Diagnosis Date  . Aortic stenosis   . Cataplexy   . Diastolic heart failure (HCC)   . Essential hypertension   . GERD (gastroesophageal reflux disease)   . Hiatal hernia   . History of recurrent deep vein thrombosis (DVT)   . Mitral stenosis   . Narcolepsy   . Neuropathy   . Type 2 diabetes  mellitus (HCC)    Medications:  Medications Prior to Admission  Medication Sig Dispense Refill Last Dose  . azithromycin (ZITHROMAX) 250 MG tablet Take 250 mg by mouth daily.    03/28/2017 at Unknown time  . BYDUREON 2 MG PEN Inject 2 mg into the skin once a week.  5 Past Week at Unknown time  . carvedilol (COREG) 3.125 MG tablet Take 1 tablet (3.125 mg total) by mouth 2 (two) times daily with a meal. 60 tablet 6 03/28/2017 at 2200  . celecoxib (CELEBREX) 200 MG capsule Take 200 mg by mouth daily. For arthritis   03/28/2017 at Unknown time  . Cholecalciferol (VITAMIN D3) 3000 UNITS TABS Take 1 capsule by mouth daily.   03/28/2017 at Unknown time  . clomiPRAMINE (ANAFRANIL) 25 MG capsule Take 25 mg by mouth 4 (four) times daily.    03/28/2017 at Unknown time  . clonazePAM (KLONOPIN) 1 MG tablet Take 1 mg by mouth at bedtime.    03/28/2017 at Unknown time  . Coenzyme Q10 (CO Q10) 200 MG CAPS Take 2 capsules by mouth daily.    03/28/2017 at Unknown time  . DULoxetine (CYMBALTA) 60 MG capsule Take 60 mg by mouth daily.     03/28/2017 at Unknown time  . ferrous sulfate 325 (65 FE) MG tablet Take 325 mg by mouth daily with breakfast.   03/28/2017 at Unknown time  . furosemide (  LASIX) 40 MG tablet Take 20 mg by mouth 2 (two) times daily.   5 03/28/2017 at Unknown time  . Hyaluronic Acid-Vitamin C (HYALURONIC ACID PO) Take 100 mg by mouth daily.   03/28/2017 at Unknown time  . HYDROcodone-acetaminophen (NORCO/VICODIN) 5-325 MG tablet Take 1 tablet by mouth every 4 (four) hours as needed. 15 tablet 0 03/28/2017 at Unknown time  . insulin glargine (LANTUS) 100 unit/mL SOPN Inject 24 Units into the skin at bedtime.    03/28/2017 at Unknown time  . Insulin Glargine (TOUJEO MAX SOLOSTAR) 300 UNIT/ML SOPN Inject 24 Units into the skin every evening.   03/28/2017 at Unknown time  . magnesium oxide (MAG-OX) 400 MG tablet Take 400 mg by mouth daily.   03/28/2017 at Unknown time  . metFORMIN (GLUCOPHAGE) 500 MG tablet Take 500  mg by mouth 2 (two) times daily with a meal.     03/28/2017 at Unknown time  . mirtazapine (REMERON) 15 MG tablet Take 15 mg by mouth at bedtime.     03/28/2017 at Unknown time  . Multiple Vitamin (MULTIVITAMIN WITH MINERALS) TABS tablet Take 1 tablet by mouth daily.   03/28/2017 at Unknown time  . potassium chloride (K-DUR) 10 MEQ tablet Take 1 tablet (10 mEq total) by mouth daily. 90 tablet 3 03/28/2017 at Unknown time  . Probiotic Product (PROBIOTIC ADVANCED PO) Take 1 capsule by mouth daily.   03/28/2017 at Unknown time  . QUEtiapine (SEROQUEL) 100 MG tablet Take 100 mg by mouth at bedtime.   03/28/2017 at Unknown time  . warfarin (COUMADIN) 5 MG tablet Take 2.5-5 mg by mouth every evening. Starting 10/29/2016 take 2.5 mg alternating with 5 mg daily    03/28/2017 at 2200  . cephALEXin (KEFLEX) 500 MG capsule Take 1 capsule (500 mg total) by mouth every 6 (six) hours. (Patient not taking: Reported on 03/25/2017) 40 capsule 0 Not Taking at Unknown time   Assessment: 77yo female on chronic Coumadin for h/o DVT.  INR therapeutic on admission and remain therapeutic.   Home dose reportedly 2.5mg  alternating w/ 5mg .    Goal of Therapy:  INR 2-3 Monitor platelets by anticoagulation protocol: Yes   Plan: Coumadin 2.5mg  today x 1 INR daily Monitor for S/S of bleeding  Elder Cyphers, BS Loura Back, BCPS Clinical Pharmacist Pager [phone] 04/01/2017,8:24 AM

## 2017-04-02 LAB — PROTIME-INR
INR: 2.28
Prothrombin Time: 24.9 seconds — ABNORMAL HIGH (ref 11.4–15.2)

## 2017-04-02 LAB — VANCOMYCIN, TROUGH: VANCOMYCIN TR: 23 ug/mL — AB (ref 15–20)

## 2017-04-02 MED ORDER — WARFARIN SODIUM 5 MG PO TABS
5.0000 mg | ORAL_TABLET | Freq: Once | ORAL | Status: AC
  Administered 2017-04-02: 5 mg via ORAL
  Filled 2017-04-02: qty 1

## 2017-04-02 MED ORDER — VANCOMYCIN HCL IN DEXTROSE 750-5 MG/150ML-% IV SOLN
750.0000 mg | Freq: Two times a day (BID) | INTRAVENOUS | Status: DC
  Administered 2017-04-02 – 2017-04-03 (×3): 750 mg via INTRAVENOUS
  Filled 2017-04-02 (×5): qty 150

## 2017-04-02 NOTE — Progress Notes (Signed)
ANTICOAGULATION CONSULT NOTE   Pharmacy Consult for Warfarin (home med) Indication: VTE treatment  Allergies  Allergen Reactions  . Codeine Anxiety  . Compazine Anaphylaxis  . Other Anaphylaxis, Rash and Other (See Comments)    Uncoded Allergy. Allergen: INOVAR Uncoded Allergy. Allergen: ALL ADHESIVES Uncoded Allergy. Allergen: SILK SUTURE Uncoded Allergy. Allergen: merthiolate Thermasol  . Penicillins Shortness Of Breath and Rash    Has patient had a PCN reaction causing immediate rash, facial/tongue/throat swelling, SOB or lightheadedness with hypotension: Yes Has patient had a PCN reaction causing severe rash involving mucus membranes or skin necrosis: No Has patient had a PCN reaction that required hospitalization No Has patient had a PCN reaction occurring within the last 10 years: Yes If all of the above answers are "NO", then may proceed with Cephalosporin use.   Marland Kitchen Morphine And Related Other (See Comments)    Patient states that it makes her lose her state of mind.   . Demerol [Meperidine] Rash   Patient Measurements: Height: 5\' 8"  (172.7 cm) Weight: 224 lb (101.6 kg) IBW/kg (Calculated) : 63.9  Vital Signs: Temp: 98.7 F (37.1 C) (02/27 0500) Temp Source: Oral (02/27 0500) BP: 137/65 (02/27 0500) Pulse Rate: 68 (02/27 0500)  Labs: Recent Labs    03/31/17 0537 04/01/17 0539 04/02/17 0457  LABPROT 24.1* 23.9* 24.9*  INR 2.19 2.16 2.28    Estimated Creatinine Clearance: 64.6 mL/min (by C-G formula based on SCr of 0.91 mg/dL).   Medical History: Past Medical History:  Diagnosis Date  . Aortic stenosis   . Cataplexy   . Diastolic heart failure (HCC)   . Essential hypertension   . GERD (gastroesophageal reflux disease)   . Hiatal hernia   . History of recurrent deep vein thrombosis (DVT)   . Mitral stenosis   . Narcolepsy   . Neuropathy   . Type 2 diabetes mellitus (HCC)    Medications:  Medications Prior to Admission  Medication Sig Dispense  Refill Last Dose  . azithromycin (ZITHROMAX) 250 MG tablet Take 250 mg by mouth daily.    03/28/2017 at Unknown time  . BYDUREON 2 MG PEN Inject 2 mg into the skin once a week.  5 Past Week at Unknown time  . carvedilol (COREG) 3.125 MG tablet Take 1 tablet (3.125 mg total) by mouth 2 (two) times daily with a meal. 60 tablet 6 03/28/2017 at 2200  . celecoxib (CELEBREX) 200 MG capsule Take 200 mg by mouth daily. For arthritis   03/28/2017 at Unknown time  . Cholecalciferol (VITAMIN D3) 3000 UNITS TABS Take 1 capsule by mouth daily.   03/28/2017 at Unknown time  . clomiPRAMINE (ANAFRANIL) 25 MG capsule Take 25 mg by mouth 4 (four) times daily.    03/28/2017 at Unknown time  . clonazePAM (KLONOPIN) 1 MG tablet Take 1 mg by mouth at bedtime.    03/28/2017 at Unknown time  . Coenzyme Q10 (CO Q10) 200 MG CAPS Take 2 capsules by mouth daily.    03/28/2017 at Unknown time  . DULoxetine (CYMBALTA) 60 MG capsule Take 60 mg by mouth daily.     03/28/2017 at Unknown time  . ferrous sulfate 325 (65 FE) MG tablet Take 325 mg by mouth daily with breakfast.   03/28/2017 at Unknown time  . furosemide (LASIX) 40 MG tablet Take 20 mg by mouth 2 (two) times daily.   5 03/28/2017 at Unknown time  . Hyaluronic Acid-Vitamin C (HYALURONIC ACID PO) Take 100 mg by mouth daily.  03/28/2017 at Unknown time  . HYDROcodone-acetaminophen (NORCO/VICODIN) 5-325 MG tablet Take 1 tablet by mouth every 4 (four) hours as needed. 15 tablet 0 03/28/2017 at Unknown time  . insulin glargine (LANTUS) 100 unit/mL SOPN Inject 24 Units into the skin at bedtime.    03/28/2017 at Unknown time  . Insulin Glargine (TOUJEO MAX SOLOSTAR) 300 UNIT/ML SOPN Inject 24 Units into the skin every evening.   03/28/2017 at Unknown time  . magnesium oxide (MAG-OX) 400 MG tablet Take 400 mg by mouth daily.   03/28/2017 at Unknown time  . metFORMIN (GLUCOPHAGE) 500 MG tablet Take 500 mg by mouth 2 (two) times daily with a meal.     03/28/2017 at Unknown time  . mirtazapine  (REMERON) 15 MG tablet Take 15 mg by mouth at bedtime.     03/28/2017 at Unknown time  . Multiple Vitamin (MULTIVITAMIN WITH MINERALS) TABS tablet Take 1 tablet by mouth daily.   03/28/2017 at Unknown time  . potassium chloride (K-DUR) 10 MEQ tablet Take 1 tablet (10 mEq total) by mouth daily. 90 tablet 3 03/28/2017 at Unknown time  . Probiotic Product (PROBIOTIC ADVANCED PO) Take 1 capsule by mouth daily.   03/28/2017 at Unknown time  . QUEtiapine (SEROQUEL) 100 MG tablet Take 100 mg by mouth at bedtime.   03/28/2017 at Unknown time  . warfarin (COUMADIN) 5 MG tablet Take 2.5-5 mg by mouth every evening. Starting 10/29/2016 take 2.5 mg alternating with 5 mg daily    03/28/2017 at 2200  . cephALEXin (KEFLEX) 500 MG capsule Take 1 capsule (500 mg total) by mouth every 6 (six) hours. (Patient not taking: Reported on 03/25/2017) 40 capsule 0 Not Taking at Unknown time   Assessment: 77yo female on chronic Coumadin for h/o DVT.  INR therapeutic on admission and remain therapeutic.   Home dose reportedly 2.5mg  alternating w/ 5mg .    Goal of Therapy:  INR 2-3 Monitor platelets by anticoagulation protocol: Yes   Plan: Coumadin 5 mg today x 1 INR daily Monitor for S/S of bleeding  Elder Cyphers, BS Loura Back, BCPS Clinical Pharmacist Pager [phone] 04/02/2017,8:37 AM

## 2017-04-02 NOTE — Progress Notes (Signed)
Pt nearing discharge for Thurs. - Fri. I believe she needs rehab for strengthening ambulation ?[facility] to [facility] ? I would discharge to [facility] thursday iif bed available ? Rebecca [name] ZOX:[id number] DOB: [birth year] DOA: 03/29/2017 PCP: [name], MD   Physical Exam: Blood pressure 137/65, pulse 68, temperature 98.7 F (37.1 C), temperature source Oral, resp. rate 18, height 5\' 8"  (1.727 m), weight 101.6 kg (224 lb), SpO2 95 %.Lungs- no wheeze no rales no ronchi. Heart - Reg Rhythm no S3 S4   Investigations:  Recent Results (from the past 240 hour(s))  Culture, blood (routine x 2)     Status: None (Preliminary result)   Collection Time: 03/29/17  6:05 AM  Result Value Ref Range Status   Specimen Description   Final    RIGHT ANTECUBITAL BOTTLES DRAWN AEROBIC AND ANAEROBIC   Special Requests   Final    Blood Culture results may not be optimal due to an excessive volume of blood received in culture bottles   Culture   Final    NO GROWTH 4 DAYS Performed at [facility], [address], [place], [place] [address]    Report Status PENDING  Incomplete  Culture, blood (routine x 2)     Status: None (Preliminary result)   Collection Time: 03/29/17  6:10 AM  Result Value Ref Range Status   Specimen Description   Final    BLOOD LEFT HAND BOTTLES DRAWN AEROBIC AND ANAEROBIC   Special Requests Blood Culture adequate volume  Final   Culture   Final    NO GROWTH 4 DAYS Performed at [facility], [address], [place], [place] [address]    Report Status PENDING  Incomplete  Culture, sputum-assessment     Status: None   Collection Time: 03/29/17  3:07 PM  Result Value Ref Range Status   Specimen Description EXPECTORATED SPUTUM  Final   Special Requests NONE  Final   Sputum evaluation   Final    THIS SPECIMEN IS ACCEPTABLE FOR SPUTUM CULTURE Performed at [facility] [address] [place], [place] [address]    Report Status 03/31/2017 FINAL  Final  Culture,  respiratory (NON-Expectorated)     Status: None (Preliminary result)   Collection Time: 03/29/17  3:07 PM  Result Value Ref Range Status   Specimen Description   Final    EXPECTORATED SPUTUM Performed at [facility], [address] [place], [place] [address]    Special Requests   Final    NONE Reflexed from [id number] Performed at [facility], [address], [place], [place] [address]    Gram Stain   Final    MODERATE WBC PRESENT, PREDOMINANTLY PMN FEW GRAM POSITIVE COCCI IN PAIRS RARE YEAST RARE GRAM NEGATIVE COCCOBACILLI    Culture   Final    CULTURE REINCUBATED FOR BETTER GROWTH Performed at [facility], [address] [place], [place] [address]    Report Status PENDING  Incomplete     Basic Metabolic Panel: No results for input(s): NA, K, CL, CO2, GLUCOSE, BUN, CREATININE, CALCIUM, MG, PHOS in the last 72 hours. Liver Function Tests: No results for input(s): AST, ALT, ALKPHOS, BILITOT, PROT, ALBUMIN in the last 72 hours.   CBC: No results for input(s): WBC, NEUTROABS, HGB, HCT, MCV, PLT in the last 72 hours.  No results found.    Medications:   Impression:  Principal Problem:   HCAP (healthcare-associated pneumonia) Active Problems:   GERD (gastroesophageal reflux  disease)   Narcolepsy-evaluated at [facility]   NAFLD (nonalcoholic fatty liver disease)   Obesity-BMI 37   Lower leg DVT (deep venous thromboembolism), chronic (HCC)   Depression   Poorly controlled diabetes mellitus (HCC)     Plan: discharge in 24- 48 . Rehab stay for strengthening ambulation if bed available in [facility] ?Marland Kitchen Continue Vanc & Aztreonam and duonebs  Consultants:    Procedures   Antibiotics:         Time spent:30 min/   LOS: 4 days   Dorinda Stehr M   04/02/2017, 1:28 PM

## 2017-04-02 NOTE — NC FL2 (Signed)
[place] MEDICAID FL2 LEVEL OF CARE SCREENING TOOL     IDENTIFICATION  Patient Name: Rebecca [name] Birthdate: 05-Jun-1940 Sex: female Admission Date (Current Location): 03/29/2017  [facility] and [place] Number:  [employer] and Address:  [facility],  [address], [place] [address]      Provider Number: [id number]  Attending Physician Name and Address:  [name], MD  Relative Name and Phone Number:       Current Level of Care: Hospital Recommended Level of Care: Skilled Nursing Facility Prior Approval Number:    Date Approved/Denied:   PASRR Number: [id number]  Discharge Plan: SNF    Current Diagnoses: Patient Active Problem List   Diagnosis Date Noted  . HCAP (healthcare-associated pneumonia) 03/29/2017  . Cellulitis 03/03/2017  . Type 2 diabetes mellitus with other specified complication (HCC) 03/03/2017  . CKD (chronic kidney disease), stage III (HCC) 03/03/2017  . Status post placement of cardiac pacemaker 10/29/2016  . Pulmonary edema 10/23/2016  . CAP (community acquired pneumonia) 02/22/2015  . Fall at home 09/21/2014  . Mild aortic stenosis 09/08/2014  . Mild to moderate  mitral stenosis 09/08/2014  . Chronic anticoagulation-Coumadin 09/08/2014  . Sepsis (HCC) 09/05/2014  . Lower leg DVT (deep venous thromboembolism), chronic (HCC) 09/05/2014  . Essential hypertension 09/05/2014  . Depression 09/05/2014  . Poorly controlled diabetes mellitus (HCC) 09/05/2014  . Hypomagnesemia 09/05/2014  . Hyponatremia 09/05/2014  . Pyelonephritis 09/05/2014  . Chronic diastolic CHF (congestive heart failure) (HCC)   . 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 GERD (gastroesophageal reflux disease) 07/08/2011  . Gastroparesis 07/08/2011  . IDA (iron deficiency anemia) 07/08/2011  . History of colonic polyps 07/08/2011  . Narcolepsy-evaluated at [facility] 07/08/2011  . NAFLD (nonalcoholic fatty liver disease) 56/21/3086   . Obesity-BMI 37 07/08/2011    Orientation RESPIRATION BLADDER Height & Weight     Self, Time, Situation, Place  O2(2L) Continent Weight: 224 lb (101.6 kg) Height:  5\' 8"  (172.7 cm)  BEHAVIORAL SYMPTOMS/MOOD NEUROLOGICAL BOWEL NUTRITION STATUS      Continent Diet(Heart Healthy/Carb Modified )  AMBULATORY STATUS COMMUNICATION OF NEEDS Skin   Limited Assist   Normal                       Personal Care Assistance Level of Assistance  Bathing, Feeding, Dressing Bathing Assistance: Limited assistance Feeding assistance: Independent Dressing Assistance: Limited assistance     Functional Limitations Info  Speech, Hearing, Sight Sight Info: Adequate Hearing Info: Adequate Speech Info: Adequate    SPECIAL CARE FACTORS FREQUENCY  PT (By licensed PT)     PT Frequency: 5x/week              Contractures Contractures Info: Not present    Additional Factors Info  Code Status, Allergies, Psychotropic Code Status Info: Full Code Allergies Info: Codeine, Compazine, Other, Penicillin, Morphine and Related, Demerol Psychotropic Info: Klonopin, Cymbalta, Remeron, Seroquel         Current Medications (04/02/2017):  This is the current hospital active medication list Current Facility-Administered Medications  Medication Dose Route Frequency Provider Last Rate Last Dose  . 0.9 %  sodium chloride infusion   Intravenous Continuous Philip Aspen, Limmie Patricia, MD 75 mL/hr at 04/01/17 1108    . acetaminophen (TYLENOL) tablet 650 mg  650 mg Oral Q6H PRN Philip Aspen, Limmie Patricia, MD       Or  . acetaminophen (TYLENOL) suppository 650 mg  650 mg Rectal  Q6H PRN Philip Aspen, Limmie Patricia, MD      . aztreonam (AZACTAM) 2 g in sodium chloride 0.9 % 100 mL IVPB  2 g Intravenous Q8H Philip Aspen, Limmie Patricia, MD 200 mL/hr at 04/02/17 0355 2 g at 04/02/17 0355  . carvedilol (COREG) tablet 3.125 mg  3.125 mg Oral BID WC Philip Aspen, Limmie Patricia, MD   3.125 mg at 04/02/17 0854  .  cholecalciferol (VITAMIN D) tablet 3,000 Units  3,000 Units Oral Daily Philip Aspen, Limmie Patricia, MD   3,000 Units at 04/01/17 [phone]  . clomiPRAMINE (ANAFRANIL) capsule 25 mg  25 mg Oral QID Philip Aspen, Limmie Patricia, MD   25 mg at 04/01/17 2150  . clonazePAM (KLONOPIN) tablet 1 mg  1 mg Oral QHS Philip Aspen, Limmie Patricia, MD   1 mg at 04/01/17 2146  . diphenhydrAMINE (BENADRYL) injection 25 mg  25 mg Intramuscular Q6H PRN Leda Gauze, NP   25 mg at 03/31/17 1946  . docusate sodium (COLACE) capsule 100 mg  100 mg Oral BID Philip Aspen, Limmie Patricia, MD   100 mg at 04/01/17 2146  . DULoxetine (CYMBALTA) DR capsule 60 mg  60 mg Oral Daily Philip Aspen, Limmie Patricia, MD   60 mg at 04/01/17 0855  . Exenatide ER PEN 2 mg  2 mg Subcutaneous Weekly Philip Aspen, Limmie Patricia, MD      . ferrous sulfate tablet 325 mg  325 mg Oral Q breakfast Philip Aspen, Limmie Patricia, MD   325 mg at 04/02/17 0854  . furosemide (LASIX) tablet 20 mg  20 mg Oral BID Philip Aspen, Limmie Patricia, MD   20 mg at 04/02/17 0854  . HYDROcodone-acetaminophen (NORCO/VICODIN) 5-325 MG per tablet 1 tablet  1 tablet Oral Q4H PRN Philip Aspen, Limmie Patricia, MD   1 tablet at 04/02/17 [phone]  . insulin aspart (novoLOG) injection 0-5 Units  0-5 Units Subcutaneous QHS Philip Aspen, Limmie Patricia, MD   2 Units at 03/30/17 2033  . insulin aspart (novoLOG) injection 0-9 Units  0-9 Units Subcutaneous TID WC Philip Aspen, Limmie Patricia, MD   3 Units at 04/01/17 1118  . insulin aspart (novoLOG) injection 3 Units  3 Units Subcutaneous TID WC Philip Aspen, Limmie Patricia, MD   3 Units at 04/02/17 [phone]  . insulin glargine (LANTUS) injection 24 Units  24 Units Subcutaneous QHS Philip Aspen, Limmie Patricia, MD   24 Units at 04/01/17 2150  . ipratropium-albuterol (DUONEB) 0.5-2.5 (3) MG/3ML nebulizer solution 3 mL  3 mL Nebulization QID [name], MD   3 mL at 04/02/17 0740  . magnesium oxide (MAG-OX) tablet 400 mg  400 mg Oral Daily Philip Aspen, Limmie Patricia, MD   400 mg at 04/01/17 0859  . mirtazapine (REMERON) tablet 15 mg  15 mg Oral QHS Philip Aspen, Limmie Patricia, MD   15 mg at 04/01/17 2146  . multivitamin with minerals tablet 1 tablet  1 tablet Oral Daily Philip Aspen, Limmie Patricia, MD   1 tablet at 04/01/17 [phone]  . ondansetron (ZOFRAN) tablet 4 mg  4 mg Oral Q6H PRN Philip Aspen, Limmie Patricia, MD       Or  . ondansetron [facility]) injection 4 mg  4 mg Intravenous Q6H PRN Philip Aspen, Limmie Patricia, MD      . potassium chloride (K-DUR,KLOR-CON) CR tablet 10 mEq  10 mEq Oral Daily Philip Aspen, Limmie Patricia, MD   10 mEq at 04/01/17 0856  . QUEtiapine (SEROQUEL) tablet 100 mg  100 mg Oral QHS Philip Aspen, Limmie Patricia, MD   100 mg at 04/01/17 2146  . vancomycin (VANCOCIN) IVPB 750 mg/150 ml premix  750 mg Intravenous Q12H Dondiego, Richard, MD      . warfarin (COUMADIN) tablet 5 mg  5 mg Oral Once [name], MD      . Warfarin - Pharmacist Dosing Inpatient   Does not apply Q24H [name], MD         Discharge Medications: Please see discharge summary for a list of discharge medications.  Relevant Imaging Results:  Relevant Lab Results:   Additional Information SSN [id number] Juleen China, LCSW

## 2017-04-02 NOTE — Clinical Social Work Note (Signed)
Clinical Social Work Assessment  Patient Details  Name: Rebecca [name] MRN: [id number] Date of Birth: 12/26/1940  Date of referral:  04/02/17               Reason for consult:  Facility Placement                Permission sought to share information with:    Permission granted to share information::     Name::        Agency::     Relationship::     Contact Information:  daughter, Rebecca [name] and granddaughter, Rebecca [name] [phone]) were at bedside   Housing/Transportation Living arrangements for the past 2 months:  Single Family Home Source of Information:  Patient, Adult Children, Other (Comment Required)(granddaughter ) Patient Interpreter Needed:  None Criminal Activity/Legal Involvement Pertinent to Current Situation/Hospitalization:  No - Comment as needed Significant Relationships:  Adult Children, Other Family Members Lives with:  Adult Children Do you feel safe going back to the place where you live?  Yes Need for family participation in patient care:  Yes (Comment)  Care giving concerns:  None identified.    Social Worker assessment / plan: Patient's son lives with her and her granddaughter lives beside her.  At baseline, patient ambulates with a walker, performs ADLs independently and cooks.  Patient and family are agreeable to SNF for STR.   Employment status:  Retired Database administrator PT Recommendations:  Skilled Nursing Facility Information / Referral to community resources:  Skilled Nursing Facility  Patient/Family's Response to care: Patient and family are agreeable to STR at [facility].   Patient/Family's Understanding of and Emotional Response to Diagnosis, Current Treatment, and Prognosis:  Patient and family understand her diagnosis, treatment and prognosis.   Emotional Assessment Appearance:  Appears stated age Attitude/Demeanor/Rapport:    Affect (typically observed):  Accepting, Calm Orientation:  Oriented to Self, Oriented to Place,  Oriented to  Time, Oriented to Situation Alcohol / Substance use:  Not Applicable Psych involvement (Current and /or in the community):  No (Comment)  Discharge Needs  Concerns to be addressed:  Discharge Planning Concerns Readmission within the last 30 days:  No Current discharge risk:  None Barriers to Discharge:  No Barriers Identified   Annice Needy, LCSW 04/02/2017, 9:53 AM

## 2017-04-02 NOTE — Progress Notes (Signed)
PT Cancellation Note  Patient Details Name: Rebecca [name] MRN: [id number] DOB: 09-30-1940   Cancelled Treatment:    Reason Eval/Treat Not Completed: Fatigue/lethargy limiting ability to participate Attempted PT session.  Pt supine in bed and reports she has been coughing up blood (RN aware) and c/o fatigue due to being up all morning.  Will try again later if available.  [address] LPTA; CBIS [phone]  Juel Burrow 04/02/2017, 10:45 AM

## 2017-04-02 NOTE — Progress Notes (Signed)
Pharmacy Antibiotic Note  Rebecca [name] is a 77 y.o. female admitted on 03/29/2017 with pneumonia.  Pharmacy has been consulted for Vancomycin and aztreonam dosing. VT this AM slightly elevated, will adjust. Patient slowly improving per MD.  Plan: Decrease Vancomycin 750mg  IV every 12 hours.  Goal trough 15-20 mcg/mL. Continue Aztreonam 2gm IV q8h Continue to monitor cxs and clinical progress F/U deescalation to oral therapy  Height: 5\' 8"  (172.7 cm) Weight: 224 lb (101.6 kg) IBW/kg (Calculated) : 63.9  Temp (24hrs), Avg:98.4 F (36.9 C), Min:98.1 F (36.7 C), Max:98.7 F (37.1 C)  Recent Labs  Lab 03/29/17 0604 03/30/17 0634 04/02/17 0457  WBC 5.3 5.1  --   CREATININE 1.00 0.91  --   VANCOTROUGH  --   --  23*    Estimated Creatinine Clearance: 64.6 mL/min (by C-G formula based on SCr of 0.91 mg/dL).    Allergies  Allergen Reactions  . Codeine Anxiety  . Compazine Anaphylaxis  . Other Anaphylaxis, Rash and Other (See Comments)    Uncoded Allergy. Allergen: INOVAR Uncoded Allergy. Allergen: ALL ADHESIVES Uncoded Allergy. Allergen: SILK SUTURE Uncoded Allergy. Allergen: merthiolate Thermasol  . Penicillins Shortness Of Breath and Rash    Has patient had a PCN reaction causing immediate rash, facial/tongue/throat swelling, SOB or lightheadedness with hypotension: Yes Has patient had a PCN reaction causing severe rash involving mucus membranes or skin necrosis: No Has patient had a PCN reaction that required hospitalization No Has patient had a PCN reaction occurring within the last 10 years: Yes If all of the above answers are "NO", then may proceed with Cephalosporin use.   Marland Kitchen Morphine And Related Other (See Comments)    Patient states that it makes her lose her state of mind.   . Demerol [Meperidine] Rash    Antimicrobials this admission: Vancomycin 2/23>> Aztreonam 2/23>>  Dose adjustments this admission: 2/27 Change Vancomycin 750mg  IV q12h  Microbiology  results: 2/23 BCX: negative  2/23  Sputum: few GPC, Rate GNCoccobacilli   Thank you for allowing pharmacy to be a part of this patient's care.  Elder Cyphers, BS Pharm D, [place] Clinical Pharmacist Pager [phone] 04/02/2017 8:27 AM

## 2017-04-03 LAB — CULTURE, BLOOD (ROUTINE X 2)
Culture: NO GROWTH
Culture: NO GROWTH
Special Requests: ADEQUATE

## 2017-04-03 LAB — GLUCOSE, CAPILLARY
GLUCOSE-CAPILLARY: 70 mg/dL (ref 65–99)
GLUCOSE-CAPILLARY: 67 mg/dL (ref 65–99)
Glucose-Capillary: 135 mg/dL — ABNORMAL HIGH (ref 65–99)
Glucose-Capillary: 144 mg/dL — ABNORMAL HIGH (ref 65–99)
Glucose-Capillary: 148 mg/dL — ABNORMAL HIGH (ref 65–99)

## 2017-04-03 LAB — BASIC METABOLIC PANEL
Anion gap: 9 (ref 5–15)
BUN: 14 mg/dL (ref 6–20)
CALCIUM: 8.3 mg/dL — AB (ref 8.9–10.3)
CO2: 28 mmol/L (ref 22–32)
CREATININE: 0.66 mg/dL (ref 0.44–1.00)
Chloride: 104 mmol/L (ref 101–111)
Glucose, Bld: 71 mg/dL (ref 65–99)
Potassium: 3.7 mmol/L (ref 3.5–5.1)
SODIUM: 141 mmol/L (ref 135–145)

## 2017-04-03 LAB — CULTURE, RESPIRATORY: CULTURE: NORMAL

## 2017-04-03 LAB — PROTIME-INR
INR: 2.15
Prothrombin Time: 23.8 seconds — ABNORMAL HIGH (ref 11.4–15.2)

## 2017-04-03 LAB — CULTURE, RESPIRATORY W GRAM STAIN

## 2017-04-03 MED ORDER — AZITHROMYCIN 250 MG PO TABS
500.0000 mg | ORAL_TABLET | Freq: Every day | ORAL | Status: AC
  Administered 2017-04-03: 500 mg via ORAL
  Filled 2017-04-03: qty 2

## 2017-04-03 MED ORDER — WARFARIN SODIUM 2.5 MG PO TABS: 2.5000 mg | ORAL_TABLET | Freq: Once | ORAL | Status: DC

## 2017-04-03 MED ORDER — AZITHROMYCIN 250 MG PO TABS: 250.0000 mg | ORAL_TABLET | Freq: Every day | ORAL | Status: DC

## 2017-04-03 MED ORDER — AZITHROMYCIN 250 MG PO TABS: ORAL_TABLET | ORAL | 0 refills | Status: DC

## 2017-04-03 NOTE — Care Management Important Message (Signed)
Important Message  Patient Details  Name: Rebecca [name] MRN: [id number] Date of Birth: [birth year]   Medicare Important Message Given:  Yes    Malcolm Metro, RN 04/03/2017, 2:18 PM

## 2017-04-03 NOTE — Discharge Summary (Signed)
Physician Discharge Summary  Rebecca [name]:[id number] DOB: December 02, 1940 DOA: 03/29/2017  PCP: [name], MD  Admit date: 03/29/2017 Discharge date: 04/03/2017   Recommendations for Outpatient Follow-up:  Zithromax 250 mg P.O. Daily x 4 days . Follow up in office within one week. 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 Treated with Vanco & Aztreonam for 6 days she was switched to oral z pack as directed upon discharge . She had resolutin of symptoms and good renal function her coumadin was alternated 5 mg. / 2.5 Mg. Every other day. She likewise had physical therapy and re=quest [facility] for physical therapy. She will follow in office within one week    Hospital Course:  See HPI above.  Procedures:    Consultations:  Pharmacy   Discharge Instructions   Allergies as of 04/03/2017      Reactions   Codeine Anxiety   Compazine Anaphylaxis   Other Anaphylaxis, Rash, Other (See Comments)   Uncoded Allergy. Allergen: INOVAR Uncoded Allergy. Allergen: ALL ADHESIVES Uncoded Allergy. Allergen: SILK SUTURE Uncoded Allergy. Allergen: merthiolate Thermasol   Penicillins Shortness Of Breath, Rash   Has patient had a PCN reaction causing immediate rash, facial/tongue/throat swelling, SOB or lightheadedness with hypotension: Yes Has patient had a PCN reaction causing severe rash involving mucus membranes or skin necrosis: No Has patient had a PCN reaction that  required hospitalization No Has patient had a PCN reaction occurring within the last 10 years: Yes If all of the above answers are "NO", then may proceed with Cephalosporin use.   Morphine And Related Other (See Comments)   Patient states that it makes her lose her state of mind.    Demerol [meperidine] Rash      Medication List    STOP taking these medications   cephALEXin 500 MG capsule Commonly known as:  KEFLEX     TAKE these medications   azithromycin 250 MG tablet Commonly known as:  ZITHROMAX One tab P.O. Daily x 4 days What changed:    how much to take  how to take this  when to take this  additional instructions   BYDUREON 2 MG Pen Generic drug:  Exenatide ER Inject 2 mg into the skin once a week.   carvedilol 3.125 MG tablet Commonly known as:  COREG Take 1 tablet (3.125 mg total) by mouth 2 (two) times daily with a meal.   celecoxib 200 MG capsule Commonly known as:  CELEBREX Take 200 mg by mouth daily. For arthritis   clomiPRAMINE 25 MG capsule Commonly known as:  ANAFRANIL Take 25 mg by mouth 4 (four) times daily.   clonazePAM 1 MG tablet Commonly known as:  KLONOPIN Take 1 mg by mouth at bedtime.   Co Q10 200 MG Caps Take 2 capsules by mouth daily.   DULoxetine 60 MG capsule Commonly known as:  CYMBALTA Take 60 mg by mouth daily.   ferrous sulfate 325 (65 FE) MG tablet Take 325 mg by mouth daily with  breakfast.   furosemide 40 MG tablet Commonly known as:  LASIX Take 20 mg by mouth 2 (two) times daily.   HYALURONIC ACID PO Take 100 mg by mouth daily.   HYDROcodone-acetaminophen 5-325 MG tablet Commonly known as:  NORCO/VICODIN Take 1 tablet by mouth every 4 (four) hours as needed.   insulin glargine 100 unit/mL Sopn Commonly known as:  LANTUS Inject 24 Units into the skin at bedtime. What changed:  Another medication with the same name was removed. Continue taking this medication, and follow the directions you see here.    magnesium oxide 400 MG tablet Commonly known as:  MAG-OX Take 400 mg by mouth daily.   metFORMIN 500 MG tablet Commonly known as:  GLUCOPHAGE Take 500 mg by mouth 2 (two) times daily with a meal.   mirtazapine 15 MG tablet Commonly known as:  REMERON Take 15 mg by mouth at bedtime.   multivitamin with minerals Tabs tablet Take 1 tablet by mouth daily.   potassium chloride 10 MEQ tablet Commonly known as:  K-DUR Take 1 tablet (10 mEq total) by mouth daily.   PROBIOTIC ADVANCED PO Take 1 capsule by mouth daily.   QUEtiapine 100 MG tablet Commonly known as:  SEROQUEL Take 100 mg by mouth at bedtime.   Vitamin D3 3000 units Tabs Take 1 capsule by mouth daily.   warfarin 5 MG tablet Commonly known as:  COUMADIN Take 2.5-5 mg by mouth every evening. Starting 10/29/2016 take 2.5 mg alternating with 5 mg daily      Allergies  Allergen Reactions  . Codeine Anxiety  . Compazine Anaphylaxis  . Other Anaphylaxis, Rash and Other (See Comments)    Uncoded Allergy. Allergen: INOVAR Uncoded Allergy. Allergen: ALL ADHESIVES Uncoded Allergy. Allergen: SILK SUTURE Uncoded Allergy. Allergen: merthiolate Thermasol  . Penicillins Shortness Of Breath and Rash    Has patient had a PCN reaction causing immediate rash, facial/tongue/throat swelling, SOB or lightheadedness with hypotension: Yes Has patient had a PCN reaction causing severe rash involving mucus membranes or skin necrosis: No Has patient had a PCN reaction that required hospitalization No Has patient had a PCN reaction occurring within the last 10 years: Yes If all of the above answers are "NO", then may proceed with Cephalosporin use.   Marland Kitchen Morphine And Related Other (See Comments)    Patient states that it makes her lose her state of mind.   . Demerol [Meperidine] Rash      The results of significant diagnostics from this hospitalization (including imaging, microbiology, ancillary and laboratory) are listed below for  reference.    Significant Diagnostic Studies: Dg Chest 2 View  Result Date: 03/29/2017 CLINICAL DATA:  77 year old female with shortness of breath. EXAM: CHEST  2 VIEW COMPARISON:  Chest radiograph dated 10/26/2016 FINDINGS: There is an area of airspace opacity in the right upper lung field concerning for pneumonia. Clinical correlation is recommended. There is mild cardiomegaly with mild vascular congestion small bilateral pleural effusions may be present. No pneumothorax. Left pectoral pacemaker device. No acute osseous pathology IMPRESSION: 1. Focal opacity in the right upper lung field concerning for pneumonia. 2. Cardiomegaly with mild vascular congestion and probable small bilateral pleural effusions. Electronically Signed   By: Elgie Collard M.D.   On: 03/29/2017 05:57   Dg Knee 2 Views Right  Result Date: 03/25/2017 CLINICAL DATA:  Pain. EXAM: RIGHT KNEE - 1-2 VIEW COMPARISON:  None. FINDINGS: Small suprapatellar joint effusion. Severe lateral compartment joint space narrowing, subchondral sclerosis and  marginal spur formation noted. Mild to moderate medial and patellofemoral compartment osteoarthritis also noted. Atherosclerotic vascular calcifications identified. IMPRESSION: 1. Severe lateral compartment osteoarthritis. 2. Small joint effusion. Electronically Signed   By: Signa Kell M.D.   On: 03/25/2017 12:03    Microbiology: Recent Results (from the past 240 hour(s))  Culture, blood (routine x 2)     Status: None   Collection Time: 03/29/17  6:05 AM  Result Value Ref Range Status   Specimen Description   Final    RIGHT ANTECUBITAL BOTTLES DRAWN AEROBIC AND ANAEROBIC   Special Requests   Final    Blood Culture results may not be optimal due to an excessive volume of blood received in culture bottles   Culture   Final    NO GROWTH 5 DAYS Performed at [facility], [address] [place], [place] [address]    Report Status 04/03/2017 FINAL  Final  Culture, blood (routine x  2)     Status: None   Collection Time: 03/29/17  6:10 AM  Result Value Ref Range Status   Specimen Description   Final    BLOOD LEFT HAND BOTTLES DRAWN AEROBIC AND ANAEROBIC   Special Requests Blood Culture adequate volume  Final   Culture   Final    NO GROWTH 5 DAYS Performed at [facility], [address], [place], [place] [address]    Report Status 04/03/2017 FINAL  Final  Culture, sputum-assessment     Status: None   Collection Time: 03/29/17  3:07 PM  Result Value Ref Range Status   Specimen Description EXPECTORATED SPUTUM  Final   Special Requests NONE  Final   Sputum evaluation   Final    THIS SPECIMEN IS ACCEPTABLE FOR SPUTUM CULTURE Performed at [facility], [address] [place], [place] [address]    Report Status 03/31/2017 FINAL  Final  Culture, respiratory (NON-Expectorated)     Status: None (Preliminary result)   Collection Time: 03/29/17  3:07 PM  Result Value Ref Range Status   Specimen Description   Final    EXPECTORATED SPUTUM Performed at [facility], [address] [place], [place] [address]    Special Requests   Final    NONE Reflexed from [id number] Performed at [facility], [address] [place], [place] [address]    Gram Stain   Final    MODERATE WBC PRESENT, PREDOMINANTLY PMN FEW GRAM POSITIVE COCCI IN PAIRS RARE YEAST RARE GRAM NEGATIVE COCCOBACILLI    Culture   Final    MODERATE Consistent with normal respiratory flora. Performed at [facility], [address], [place], [place] [address]    Report Status PENDING  Incomplete     Labs: Basic Metabolic Panel: Recent Labs  Lab 03/29/17 0604 03/30/17 0634 04/03/17 0450  NA 132* 139 141  K 4.3 4.1 3.7  CL 96* 101 104  CO2 25 27 28   GLUCOSE 127* 151* 71  BUN 22* 18 14  CREATININE 1.00 0.91 0.66  CALCIUM 8.4* 8.5* 8.3*   Liver Function Tests: Recent Labs  Lab 03/29/17 0604  AST 22  ALT 15  ALKPHOS 51  BILITOT 0.7  PROT 7.1  ALBUMIN 3.0*   No results for input(s):  LIPASE, AMYLASE in the last 168 hours. No results for input(s): AMMONIA in the last 168 hours. CBC: Recent Labs  Lab 03/29/17 0604 03/30/17 0634  WBC 5.3 5.1  NEUTROABS 3.8  --   HGB 12.2 11.4*  HCT 38.2 36.4  MCV  89.3 89.7  PLT 191 168   Cardiac Enzymes: No results for input(s): CKTOTAL, CKMB, CKMBINDEX, TROPONINI in the last 168 hours. BNP: BNP (last 3 results) Recent Labs    03/29/17 0605  BNP 457.0*    ProBNP (last 3 results) No results for input(s): PROBNP in the last 8760 hours.  CBG: Recent Labs  Lab 04/02/17 1309 04/02/17 1654 04/02/17 2303 04/03/17 0743 04/03/17 1114  GLUCAP 135* 144* 70 67 148*       Signed:  Lucius Wise M   Pager: [phone] 04/03/2017, 12:38 PM

## 2017-04-03 NOTE — Progress Notes (Signed)
Report called into [place], LPN at the [facility] of [place], [place] at 1500.

## 2017-04-03 NOTE — Care Management (Signed)
DC to SNF today. CSW has made arrangements. CM has notified [facility] rep of DC plan.

## 2017-04-03 NOTE — Progress Notes (Signed)
ANTICOAGULATION CONSULT NOTE   Pharmacy Consult for Warfarin (home med) Indication: VTE treatment  Allergies  Allergen Reactions  . Codeine Anxiety  . Compazine Anaphylaxis  . Other Anaphylaxis, Rash and Other (See Comments)    Uncoded Allergy. Allergen: INOVAR Uncoded Allergy. Allergen: ALL ADHESIVES Uncoded Allergy. Allergen: SILK SUTURE Uncoded Allergy. Allergen: merthiolate Thermasol  . Penicillins Shortness Of Breath and Rash    Has patient had a PCN reaction causing immediate rash, facial/tongue/throat swelling, SOB or lightheadedness with hypotension: Yes Has patient had a PCN reaction causing severe rash involving mucus membranes or skin necrosis: No Has patient had a PCN reaction that required hospitalization No Has patient had a PCN reaction occurring within the last 10 years: Yes If all of the above answers are "NO", then may proceed with Cephalosporin use.   Marland Kitchen Morphine And Related Other (See Comments)    Patient states that it makes her lose her state of mind.   . Demerol [Meperidine] Rash   Patient Measurements: Height: 5\' 8"  (172.7 cm) Weight: 224 lb (101.6 kg) IBW/kg (Calculated) : 63.9  Vital Signs: Temp Source: Other (Comment) (02/28 0624) BP: 159/81 (02/28 0624) Pulse Rate: 85 (02/28 0624)  Labs: Recent Labs    04/01/17 0539 04/02/17 0457 04/03/17 0450  LABPROT 23.9* 24.9* 23.8*  INR 2.16 2.28 2.15  CREATININE  --   --  0.66    Estimated Creatinine Clearance: 73.4 mL/min (by C-G formula based on SCr of 0.66 mg/dL).   Medical History: Past Medical History:  Diagnosis Date  . Aortic stenosis   . Cataplexy   . Diastolic heart failure (HCC)   . Essential hypertension   . GERD (gastroesophageal reflux disease)   . Hiatal hernia   . History of recurrent deep vein thrombosis (DVT)   . Mitral stenosis   . Narcolepsy   . Neuropathy   . Type 2 diabetes mellitus (HCC)    Medications:  Medications Prior to Admission  Medication Sig Dispense  Refill Last Dose  . azithromycin (ZITHROMAX) 250 MG tablet Take 250 mg by mouth daily.    03/28/2017 at Unknown time  . BYDUREON 2 MG PEN Inject 2 mg into the skin once a week.  5 Past Week at Unknown time  . carvedilol (COREG) 3.125 MG tablet Take 1 tablet (3.125 mg total) by mouth 2 (two) times daily with a meal. 60 tablet 6 03/28/2017 at 2200  . celecoxib (CELEBREX) 200 MG capsule Take 200 mg by mouth daily. For arthritis   03/28/2017 at Unknown time  . Cholecalciferol (VITAMIN D3) 3000 UNITS TABS Take 1 capsule by mouth daily.   03/28/2017 at Unknown time  . clomiPRAMINE (ANAFRANIL) 25 MG capsule Take 25 mg by mouth 4 (four) times daily.    03/28/2017 at Unknown time  . clonazePAM (KLONOPIN) 1 MG tablet Take 1 mg by mouth at bedtime.    03/28/2017 at Unknown time  . Coenzyme Q10 (CO Q10) 200 MG CAPS Take 2 capsules by mouth daily.    03/28/2017 at Unknown time  . DULoxetine (CYMBALTA) 60 MG capsule Take 60 mg by mouth daily.     03/28/2017 at Unknown time  . ferrous sulfate 325 (65 FE) MG tablet Take 325 mg by mouth daily with breakfast.   03/28/2017 at Unknown time  . furosemide (LASIX) 40 MG tablet Take 20 mg by mouth 2 (two) times daily.   5 03/28/2017 at Unknown time  . Hyaluronic Acid-Vitamin C (HYALURONIC ACID PO) Take 100 mg by mouth  daily.   03/28/2017 at Unknown time  . HYDROcodone-acetaminophen (NORCO/VICODIN) 5-325 MG tablet Take 1 tablet by mouth every 4 (four) hours as needed. 15 tablet 0 03/28/2017 at Unknown time  . insulin glargine (LANTUS) 100 unit/mL SOPN Inject 24 Units into the skin at bedtime.    03/28/2017 at Unknown time  . Insulin Glargine (TOUJEO MAX SOLOSTAR) 300 UNIT/ML SOPN Inject 24 Units into the skin every evening.   03/28/2017 at Unknown time  . magnesium oxide (MAG-OX) 400 MG tablet Take 400 mg by mouth daily.   03/28/2017 at Unknown time  . metFORMIN (GLUCOPHAGE) 500 MG tablet Take 500 mg by mouth 2 (two) times daily with a meal.     03/28/2017 at Unknown time  . mirtazapine  (REMERON) 15 MG tablet Take 15 mg by mouth at bedtime.     03/28/2017 at Unknown time  . Multiple Vitamin (MULTIVITAMIN WITH MINERALS) TABS tablet Take 1 tablet by mouth daily.   03/28/2017 at Unknown time  . potassium chloride (K-DUR) 10 MEQ tablet Take 1 tablet (10 mEq total) by mouth daily. 90 tablet 3 03/28/2017 at Unknown time  . Probiotic Product (PROBIOTIC ADVANCED PO) Take 1 capsule by mouth daily.   03/28/2017 at Unknown time  . QUEtiapine (SEROQUEL) 100 MG tablet Take 100 mg by mouth at bedtime.   03/28/2017 at Unknown time  . warfarin (COUMADIN) 5 MG tablet Take 2.5-5 mg by mouth every evening. Starting 10/29/2016 take 2.5 mg alternating with 5 mg daily    03/28/2017 at 2200  . cephALEXin (KEFLEX) 500 MG capsule Take 1 capsule (500 mg total) by mouth every 6 (six) hours. (Patient not taking: Reported on 03/25/2017) 40 capsule 0 Not Taking at Unknown time   Assessment: 77yo female on chronic Coumadin for h/o DVT.  INR therapeutic on admission and remain therapeutic.   Home dose reportedly 2.5mg  alternating w/ 5mg .    Goal of Therapy:  INR 2-3 Monitor platelets by anticoagulation protocol: Yes   Plan: Coumadin 2.5 mg today x 1 INR daily Monitor for S/S of bleeding  Elder Cyphers, BS Loura Back, BCPS Clinical Pharmacist Pager [phone] 04/03/2017,11:32 AM

## 2017-04-03 NOTE — Progress Notes (Signed)
Physical Therapy Treatment Patient Details Name: Rebecca [name] MRN: [id number] DOB: 1940-03-21 Today's Date: 04/03/2017    History of Present Illness Rebecca [name] is a 77 y.o. female with medical comorbidities including hypertension, diabetes, GERD, diastolic heart failure, cataplexy, DVT on warfarin presents to the hospital today with cough and shortness of breath.  This began about 3 days ago.  Shortness of breath worsened overnight which is the reason for presentation.  She has been having a cough productive of brownish/reddish sputum.  Upon arrival to the ED vital signs are stable, labs are unremarkable, chest x-ray shows a focal opacity in the right upper lung field concerning for pneumonia, flu PCR is negative.  Admission is requested for further evaluation and management.    PT Comments     Pt supine in bed and willing to participate.  No reports of pain Rt knee until weight bearing then increases to 8/10.  Descreased distance with gait training due to knee pain.  Cueing to advance Rt LE and decrease dragging.  EOS pt left in chair with call bell within reach.  RN aware of pain.      Follow Up Recommendations  SNF;Supervision for mobility/OOB     Equipment Recommendations  None recommended by PT    Recommendations for Other Services       Precautions / Restrictions Precautions Precautions: Fall Precaution Comments: very unsteady and weak Restrictions Weight Bearing Restrictions: No LLE Weight Bearing: Weight bearing as tolerated    Mobility  Bed Mobility Overal bed mobility: Modified Independent       Supine to sit: Min guard Sit to supine: Min guard   General bed mobility comments: has to use siderail  Transfers Overall transfer level: Needs assistance Equipment used: Rolling walker (2 wheeled) Transfers: Sit to/from Stand Sit to Stand: Min assist         General transfer comment: requires much time and c/o right knee  pain  Ambulation/Gait Ambulation/Gait assistance: Min assist Ambulation Distance (Feet): 8 Feet Assistive device: Rolling walker (2 wheeled) Gait Pattern/deviations: Decreased step length - right;Decreased step length - left;Decreased stance time - right;Decreased stride length;Antalgic     General Gait Details: decreased gait distance due to Rt knee pain, cueing to advance Rt LE, drags some due to pain.  No LOB during session.     Stairs            Wheelchair Mobility    Modified Rankin (Stroke Patients Only)       Balance                                            Cognition Arousal/Alertness: Awake/alert Behavior During Therapy: WFL for tasks assessed/performed Overall Cognitive Status: Within Functional Limits for tasks assessed                                        Exercises      General Comments        Pertinent Vitals/Pain Pain Assessment: 0-10 Pain Score: 8  Pain Location: bilateral knees, mostly right knee with weight bearing Pain Descriptors / Indicators: Aching;Discomfort Pain Intervention(s): Limited activity within patient's tolerance;Monitored during session;Repositioned;Other (comment)(RN aware of pain)    Home Living  Prior Function            PT Goals (current goals can now be found in the care plan section)      Frequency    Min 3X/week      PT Plan Current plan remains appropriate    Co-evaluation              AM-PAC PT "6 Clicks" Daily Activity  Outcome Measure  Difficulty turning over in bed (including adjusting bedclothes, sheets and blankets)?: None Difficulty moving from lying on back to sitting on the side of the bed? : None Difficulty sitting down on and standing up from a chair with arms (e.g., wheelchair, bedside commode, etc,.)?: A Little Help needed moving to and from a bed to chair (including a wheelchair)?: A Little Help needed walking in  hospital room?: A Lot Help needed climbing 3-5 steps with a railing? : A Lot 6 Click Score: 18    End of Session Equipment Utilized During Treatment: Gait belt Activity Tolerance: Patient tolerated treatment well;Patient limited by pain;Patient limited by fatigue Patient left: in chair;with call bell/phone within reach Nurse Communication: Mobility status PT Visit Diagnosis: Muscle weakness (generalized) (M62.81);Unsteadiness on feet (R26.81);Other abnormalities of gait and mobility (R26.89)     Time: 8366-2947 PT Time Calculation (min) (ACUTE ONLY): 25 min  Charges:  $Therapeutic Activity: 23-37 mins                    G Codes:       Becky Sax, LPTA; CBIS [phone]  Juel Burrow 04/03/2017, 2:23 PM

## 2017-04-03 NOTE — Discharge Instructions (Signed)
Zithromax 250 mg P.O. Daily x 4 days . Follow up in office within one week.

## 2017-04-22 ENCOUNTER — Ambulatory Visit (INDEPENDENT_AMBULATORY_CARE_PROVIDER_SITE_OTHER): Payer: Medicare Other | Admitting: *Deleted

## 2017-04-22 DIAGNOSIS — I442 Atrioventricular block, complete: Secondary | ICD-10-CM

## 2017-04-22 NOTE — Progress Notes (Signed)
Remote pacemaker transmission.   

## 2017-04-23 ENCOUNTER — Encounter: Payer: Self-pay | Admitting: Cardiology

## 2017-04-29 LAB — CUP PACEART REMOTE DEVICE CHECK
Battery Voltage: 3.12 V
Brady Statistic AP VP Percent: 6.85 %
Brady Statistic RA Percent Paced: 7.48 %
Brady Statistic RV Percent Paced: 98.39 %
Implantable Lead Implant Date: 20180921
Implantable Lead Location: 753860
Implantable Lead Model: 5076
Implantable Pulse Generator Implant Date: 20180921
Lead Channel Impedance Value: 380 Ohm
Lead Channel Impedance Value: 399 Ohm
Lead Channel Impedance Value: 513 Ohm
Lead Channel Pacing Threshold Amplitude: 0.375 V
Lead Channel Pacing Threshold Amplitude: 0.625 V
Lead Channel Pacing Threshold Pulse Width: 0.4 ms
Lead Channel Pacing Threshold Pulse Width: 0.4 ms
Lead Channel Setting Pacing Amplitude: 1.5 V
Lead Channel Setting Sensing Sensitivity: 1.2 mV
MDC IDC LEAD IMPLANT DT: 20180921
MDC IDC LEAD LOCATION: 753859
MDC IDC MSMT BATTERY REMAINING LONGEVITY: 133 mo
MDC IDC MSMT LEADCHNL RA IMPEDANCE VALUE: 342 Ohm
MDC IDC MSMT LEADCHNL RA SENSING INTR AMPL: 3.125 mV
MDC IDC MSMT LEADCHNL RA SENSING INTR AMPL: 3.125 mV
MDC IDC MSMT LEADCHNL RV SENSING INTR AMPL: 22.125 mV
MDC IDC MSMT LEADCHNL RV SENSING INTR AMPL: 22.125 mV
MDC IDC SESS DTM: 20190318225310
MDC IDC SET LEADCHNL RV PACING AMPLITUDE: 2.5 V
MDC IDC SET LEADCHNL RV PACING PULSEWIDTH: 0.4 ms
MDC IDC STAT BRADY AP VS PERCENT: 0 %
MDC IDC STAT BRADY AS VP PERCENT: 91.54 %
MDC IDC STAT BRADY AS VS PERCENT: 1.61 %

## 2017-05-22 ENCOUNTER — Other Ambulatory Visit: Payer: Self-pay | Admitting: Physician Assistant

## 2017-06-20 ENCOUNTER — Other Ambulatory Visit: Payer: Self-pay | Admitting: *Deleted

## 2017-06-20 MED ORDER — CARVEDILOL 3.125 MG PO TABS: ORAL_TABLET | ORAL | 0 refills | Status: DC

## 2017-07-08 ENCOUNTER — Encounter: Payer: Self-pay | Admitting: Internal Medicine

## 2017-07-18 ENCOUNTER — Other Ambulatory Visit: Payer: Self-pay

## 2017-07-18 MED ORDER — CARVEDILOL 3.125 MG PO TABS: ORAL_TABLET | ORAL | 6 refills | Status: DC

## 2017-07-22 ENCOUNTER — Ambulatory Visit (INDEPENDENT_AMBULATORY_CARE_PROVIDER_SITE_OTHER): Payer: Medicare Other | Admitting: *Deleted

## 2017-07-22 DIAGNOSIS — I442 Atrioventricular block, complete: Secondary | ICD-10-CM

## 2017-07-22 NOTE — Progress Notes (Signed)
Remote pacemaker transmission.   

## 2017-07-25 LAB — CUP PACEART REMOTE DEVICE CHECK
Battery Remaining Longevity: 129 mo
Battery Voltage: 3.06 V
Brady Statistic AP VS Percent: 0 %
Brady Statistic AS VS Percent: 0.31 %
Date Time Interrogation Session: 20190618052734
Implantable Lead Implant Date: 20180921
Implantable Lead Location: 753860
Lead Channel Impedance Value: 418 Ohm
Lead Channel Pacing Threshold Amplitude: 0.375 V
Lead Channel Pacing Threshold Amplitude: 0.625 V
Lead Channel Sensing Intrinsic Amplitude: 21.75 mV
Lead Channel Sensing Intrinsic Amplitude: 3.875 mV
Lead Channel Sensing Intrinsic Amplitude: 3.875 mV
Lead Channel Setting Pacing Amplitude: 2.5 V
Lead Channel Setting Pacing Pulse Width: 0.4 ms
MDC IDC LEAD IMPLANT DT: 20180921
MDC IDC LEAD LOCATION: 753859
MDC IDC MSMT LEADCHNL RA IMPEDANCE VALUE: 342 Ohm
MDC IDC MSMT LEADCHNL RA PACING THRESHOLD PULSEWIDTH: 0.4 ms
MDC IDC MSMT LEADCHNL RV IMPEDANCE VALUE: 380 Ohm
MDC IDC MSMT LEADCHNL RV IMPEDANCE VALUE: 494 Ohm
MDC IDC MSMT LEADCHNL RV PACING THRESHOLD PULSEWIDTH: 0.4 ms
MDC IDC MSMT LEADCHNL RV SENSING INTR AMPL: 21.75 mV
MDC IDC PG IMPLANT DT: 20180921
MDC IDC SET LEADCHNL RA PACING AMPLITUDE: 1.5 V
MDC IDC SET LEADCHNL RV SENSING SENSITIVITY: 1.2 mV
MDC IDC STAT BRADY AP VP PERCENT: 8.05 %
MDC IDC STAT BRADY AS VP PERCENT: 91.64 %
MDC IDC STAT BRADY RA PERCENT PACED: 8.12 %
MDC IDC STAT BRADY RV PERCENT PACED: 99.69 %

## 2017-08-14 ENCOUNTER — Encounter: Payer: Medicare Other | Admitting: Internal Medicine

## 2017-08-18 ENCOUNTER — Ambulatory Visit: Payer: Medicare Other | Admitting: Cardiovascular Disease

## 2017-08-18 ENCOUNTER — Encounter: Payer: Self-pay | Admitting: Cardiovascular Disease

## 2017-08-18 VITALS — BP 124/64 | HR 76 | Ht 67.0 in | Wt 238.0 lb

## 2017-08-18 DIAGNOSIS — I342 Nonrheumatic mitral (valve) stenosis: Secondary | ICD-10-CM | POA: Diagnosis not present

## 2017-08-18 DIAGNOSIS — I350 Nonrheumatic aortic (valve) stenosis: Secondary | ICD-10-CM

## 2017-08-18 DIAGNOSIS — I38 Endocarditis, valve unspecified: Secondary | ICD-10-CM

## 2017-08-18 DIAGNOSIS — I5032 Chronic diastolic (congestive) heart failure: Secondary | ICD-10-CM | POA: Diagnosis not present

## 2017-08-18 DIAGNOSIS — Z950 Presence of cardiac pacemaker: Secondary | ICD-10-CM

## 2017-08-18 DIAGNOSIS — I351 Nonrheumatic aortic (valve) insufficiency: Secondary | ICD-10-CM | POA: Diagnosis not present

## 2017-08-18 DIAGNOSIS — I10 Essential (primary) hypertension: Secondary | ICD-10-CM

## 2017-08-18 DIAGNOSIS — Z86718 Personal history of other venous thrombosis and embolism: Secondary | ICD-10-CM

## 2017-08-18 NOTE — Patient Instructions (Signed)
Medication Instructions:  Your physician recommends that you continue on your current medications as directed. Please refer to the Current Medication list given to you today.   Labwork: NONE   Testing/Procedures: Your physician has requested that you have an echocardiogram. Echocardiography is a painless test that uses sound waves to create images of your heart. It provides your doctor with information about the size and shape of your heart and how well your heart's chambers and valves are working. This procedure takes approximately one hour. There are no restrictions for this procedure.    Follow-Up: Your physician recommends that you schedule a follow-up appointment in: 3-4 Months    Any Other Special Instructions Will Be Listed Below (If Applicable).     If you need a refill on your cardiac medications before your next appointment, please call your pharmacy.  Thank you for choosing [facility]!

## 2017-08-18 NOTE — Progress Notes (Signed)
SUBJECTIVE: The patient presents for routine follow-up.  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  She also has a history of chronic diastolic heart failure, hypertensive heart disease, DVTs, and moderate to severe mitral stenosis.  I last evaluated her in November 2017.  I reviewed the echocardiogram performed on 10/23/2016 which demonstrated normal left ventricular systolic function and regional wall motion, LVEF 60 to 65%, moderate LVH, grade 1 diastolic dysfunction, mild aortic stenosis with moderate aortic regurgitation, severely calcified mitral annulus with moderately thickened leaflets.  Mean gradient was 10 mmHg in the severe range with mitral valve area by pressure half-time closer to the moderate range, 1.5 cm.  There was mild mitral regurgitation.  The left atrium was severely dilated.  She denies chest pain and palpitations.  She complains of left knee pain and arthritis.  She denies shortness of breath.  She uses a walker to ambulate.  Her son lives with her and she has a very attentive granddaughter who lives next door who is present today.  She said energy levels are stable.  Overall, she said her symptoms have markedly improved ever since pacemaker placement.      Review of Systems: As per "subjective", otherwise negative.  Allergies  Allergen Reactions  . Codeine Anxiety  . Compazine Anaphylaxis  . Other Anaphylaxis, Rash and Other (See Comments)    Uncoded Allergy. Allergen: INOVAR Uncoded Allergy. Allergen: ALL ADHESIVES Uncoded Allergy. Allergen: SILK SUTURE Uncoded Allergy. Allergen: merthiolate Thermasol  . Penicillins Shortness Of Breath and Rash    Has patient had a PCN reaction causing immediate rash, facial/tongue/throat swelling, SOB or lightheadedness with hypotension: Yes Has patient had a PCN reaction causing severe rash involving mucus membranes or skin necrosis: No Has patient had a PCN reaction  that required hospitalization No Has patient had a PCN reaction occurring within the last 10 years: Yes If all of the above answers are "NO", then may proceed with Cephalosporin use.   Marland Kitchen Morphine And Related Other (See Comments)    Patient states that it makes her lose her state of mind.   . Demerol [Meperidine] Rash    Current Outpatient Medications  Medication Sig Dispense Refill  . BYDUREON 2 MG PEN Inject 2 mg into the skin once a week.  5  . carvedilol (COREG) 3.125 MG tablet TAKE 1 TABLET(3.125 MG) BY MOUTH TWICE DAILY WITH A MEAL 60 tablet 6  . celecoxib (CELEBREX) 200 MG capsule Take 200 mg by mouth daily. For arthritis    . Cholecalciferol (VITAMIN D3) 3000 UNITS TABS Take 1 capsule by mouth daily.    . clomiPRAMINE (ANAFRANIL) 25 MG capsule Take 25 mg by mouth 4 (four) times daily.     . clonazePAM (KLONOPIN) 1 MG tablet Take 1 mg by mouth at bedtime.     . Coenzyme Q10 (CO Q10) 200 MG CAPS Take 2 capsules by mouth daily.     . DULoxetine (CYMBALTA) 60 MG capsule Take 60 mg by mouth daily.      . ferrous sulfate 325 (65 FE) MG tablet Take 325 mg by mouth daily with breakfast.    . furosemide (LASIX) 40 MG tablet Take 20 mg by mouth 2 (two) times daily.   5  . Hyaluronic Acid-Vitamin C (HYALURONIC ACID PO) Take 100 mg by mouth daily.    Marland Kitchen HYDROcodone-acetaminophen (NORCO/VICODIN) 5-325 MG tablet Take 1 tablet by mouth every 4 (four) hours as needed.  15 tablet 0  . insulin glargine (LANTUS) 100 unit/mL SOPN Inject 24 Units into the skin at bedtime.     . magnesium oxide (MAG-OX) 400 MG tablet Take 400 mg by mouth daily.    . metFORMIN (GLUCOPHAGE) 500 MG tablet Take 500 mg by mouth 2 (two) times daily with a meal.      . mirtazapine (REMERON) 15 MG tablet Take 15 mg by mouth at bedtime.      . Multiple Vitamin (MULTIVITAMIN WITH MINERALS) TABS tablet Take 1 tablet by mouth daily.    . potassium chloride (K-DUR) 10 MEQ tablet Take 1 tablet (10 mEq total) by mouth daily. 90 tablet 3   . Probiotic Product (PROBIOTIC ADVANCED PO) Take 1 capsule by mouth daily.    . QUEtiapine (SEROQUEL) 100 MG tablet Take 100 mg by mouth at bedtime.    Marland Kitchen warfarin (COUMADIN) 5 MG tablet Take 2.5-5 mg by mouth every evening. Starting 10/29/2016 take 2.5 mg alternating with 5 mg daily      No current facility-administered medications for this visit.     Past Medical History:  Diagnosis Date  . Aortic stenosis   . Cataplexy   . Diastolic heart failure (HCC)   . Essential hypertension   . GERD (gastroesophageal reflux disease)   . Hiatal hernia   . History of recurrent deep vein thrombosis (DVT)   . Mitral stenosis   . Narcolepsy   . Neuropathy   . Type 2 diabetes mellitus (HCC)     Past Surgical History:  Procedure Laterality Date  . ABDOMINAL HYSTERECTOMY    . CARPAL TUNNEL RELEASE    . CARPECTOMY  12/26/2011   Procedure: CARPECTOMY;  Surgeon: Wyn Forster., MD;  Location: [facility];  Service: Orthopedics;  Laterality: Right;  PROXIMAL ROW CARPECTOMY RIGHT WRIST and neurectomy  . CHOLECYSTECTOMY    . HEMORROIDECTOMY    . KIDNEY SURGERY    . NEPHROSTOMY    . PACEMAKER IMPLANT N/A 10/25/2016   Procedure: Pacemaker Implant;  Surgeon: Regan Lemming, MD;  Location: [facility] INVASIVE CV LAB;  Service: Cardiovascular;  Laterality: N/A;  . TOTAL HIP ARTHROPLASTY     bilateral    Social History   Socioeconomic History  . Marital status: Widowed    Spouse name: Not on file  . Number of children: Not on file  . Years of education: 51  . Highest education level: Not on file  Occupational History    Employer: RETIRED  Social Needs  . Financial resource strain: Not on file  . Food insecurity:    Worry: Not on file    Inability: Not on file  . Transportation needs:    Medical: Not on file    Non-medical: Not on file  Tobacco Use  . Smoking status: Former Smoker    Last attempt to quit: 10/13/1988    Years since quitting: 28.8  . Smokeless tobacco: Never  Used  Substance and Sexual Activity  . Alcohol use: No    Alcohol/week: 0.0 oz  . Drug use: No  . Sexual activity: Not on file  Lifestyle  . Physical activity:    Days per week: Not on file    Minutes per session: Not on file  . Stress: Not on file  Relationships  . Social connections:    Talks on phone: Not on file    Gets together: Not on file    Attends religious service: Not on file  Active member of club or organization: Not on file    Attends meetings of clubs or organizations: Not on file    Relationship status: Not on file  . Intimate partner violence:    Fear of current or ex partner: Not on file    Emotionally abused: Not on file    Physically abused: Not on file    Forced sexual activity: Not on file  Other Topics Concern  . Not on file  Social History Narrative  . Not on file     Vitals:   08/18/17 1111  BP: 124/64  Pulse: 76  SpO2: 91%  Weight: 238 lb (108 kg)  Height: 5\' 7"  (1.702 m)    Wt Readings from Last 3 Encounters:  08/18/17 238 lb (108 kg)  03/29/17 224 lb (101.6 kg)  03/05/17 224 lb 10.4 oz (101.9 kg)     PHYSICAL EXAM General: NAD HEENT: Normal. Neck: No JVD, no thyromegaly. Lungs: Clear to auscultation bilaterally with normal respiratory effort. CV: Regular rate and rhythm, normal S1/S2, no S3/S4, 2/6 systolic murmur heard over right upper sternal border and along left sternal border murmur. No pretibial or periankle edema.  Stasis dermatitis of bilateral lower extremities, left greater than right.  Bilateral venous varicosities. Abdomen: Soft, nontender, no distention.  Neurologic: Alert and oriented.  Psych: Normal affect. Skin: Stasis dermatitis of bilateral lower extremities, left greater than right. Musculoskeletal: No gross deformities.    ECG: Reviewed above under Subjective   Labs: Lab Results  Component Value Date/Time   K 3.7 04/03/2017 04:50 AM   BUN 14 04/03/2017 04:50 AM   BUN 29 (H) 11/08/2014 09:31 AM    CREATININE 0.66 04/03/2017 04:50 AM   CREATININE 0.95 07/29/2012 11:30 AM   ALT 15 03/29/2017 06:04 AM   TSH 1.121 10/25/2010 05:31 AM   HGB 11.4 (L) 03/30/2017 06:34 AM     Lipids: No results found for: LDLCALC, LDLDIRECT, CHOL, TRIG, HDL     ASSESSMENT AND PLAN: 1.  Chronic diastolic heart failure: Euvolemic on Lasix 20 mg twice daily.  Blood pressure is controlled on carvedilol.  No changes to therapy.  2.  Moderate to severe mitral stenosis: Echocardiogram from September 2018 reviewed above.  She is at risk for atrial fibrillation.  She is already anticoagulated with warfarin.  She is symptomatically stable.  I will obtain a follow-up echocardiogram to assess for interval changes.  3.  Aortic valve disease with mild stenosis and moderate regurgitation: Echocardiogram from September 2018 reviewed above.  I will obtain an echocardiogram primarily to evaluate degree of mitral stenosis but will also evaluate aortic valve pathology.  4.  High-grade AV block status post pacemaker placement: Normal pacemaker function.  5.  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  6.  Hypertension: Blood pressure is normal.  No changes to therapy.   Disposition: Follow up 3 to 4 months.  Time spent: 40 minutes, of which greater than 50% was spent reviewing symptoms, relevant blood tests and studies, and discussing management plan with the patient.    Prentice Docker, M.D., F.A.C.C.

## 2017-08-20 ENCOUNTER — Ambulatory Visit (HOSPITAL_COMMUNITY)
Admission: RE | Admit: 2017-08-20 | Discharge: 2017-08-20 | Disposition: A | Discharge status: Home / Self Care | Payer: Medicare Other | Source: Ambulatory Visit | Attending: Cardiovascular Disease | Admitting: Cardiovascular Disease

## 2017-08-20 DIAGNOSIS — I080 Rheumatic disorders of both mitral and aortic valves: Secondary | ICD-10-CM | POA: Diagnosis not present

## 2017-08-20 DIAGNOSIS — I342 Nonrheumatic mitral (valve) stenosis: Secondary | ICD-10-CM

## 2017-08-20 DIAGNOSIS — I110 Hypertensive heart disease with heart failure: Secondary | ICD-10-CM | POA: Diagnosis not present

## 2017-08-20 DIAGNOSIS — G47419 Narcolepsy without cataplexy: Secondary | ICD-10-CM | POA: Insufficient documentation

## 2017-08-20 DIAGNOSIS — K219 Gastro-esophageal reflux disease without esophagitis: Secondary | ICD-10-CM | POA: Diagnosis not present

## 2017-08-20 DIAGNOSIS — Z6837 Body mass index (BMI) 37.0-37.9, adult: Secondary | ICD-10-CM | POA: Insufficient documentation

## 2017-08-20 DIAGNOSIS — E119 Type 2 diabetes mellitus without complications: Secondary | ICD-10-CM | POA: Insufficient documentation

## 2017-08-20 DIAGNOSIS — I509 Heart failure, unspecified: Secondary | ICD-10-CM | POA: Insufficient documentation

## 2017-08-20 DIAGNOSIS — E669 Obesity, unspecified: Secondary | ICD-10-CM | POA: Diagnosis not present

## 2017-08-20 NOTE — Progress Notes (Signed)
*  PRELIMINARY RESULTS* Echocardiogram 2D Echocardiogram has been performed.  Stacey Drain 08/20/2017, 1:10 PM

## 2017-08-26 ENCOUNTER — Encounter: Payer: Self-pay | Admitting: Internal Medicine

## 2017-08-26 ENCOUNTER — Ambulatory Visit: Payer: Medicare Other | Admitting: Internal Medicine

## 2017-08-26 VITALS — BP 130/74 | HR 85 | Ht 64.0 in | Wt 234.0 lb

## 2017-08-26 DIAGNOSIS — H25019 Cortical age-related cataract, unspecified eye: Secondary | ICD-10-CM | POA: Diagnosis not present

## 2017-08-26 DIAGNOSIS — I442 Atrioventricular block, complete: Secondary | ICD-10-CM | POA: Diagnosis not present

## 2017-08-26 LAB — CUP PACEART INCLINIC DEVICE CHECK
Battery Remaining Longevity: 127 mo
Battery Voltage: 3.04 V
Brady Statistic AP VP Percent: 8.01 %
Brady Statistic AS VS Percent: 1.32 %
Brady Statistic RV Percent Paced: 98.67 %
Implantable Lead Implant Date: 20180921
Implantable Lead Location: 753860
Implantable Lead Model: 5076
Implantable Pulse Generator Implant Date: 20180921
Lead Channel Impedance Value: 380 Ohm
Lead Channel Impedance Value: 380 Ohm
Lead Channel Impedance Value: 475 Ohm
Lead Channel Pacing Threshold Amplitude: 0.375 V
Lead Channel Pacing Threshold Amplitude: 0.625 V
Lead Channel Sensing Intrinsic Amplitude: 2.125 mV
Lead Channel Sensing Intrinsic Amplitude: 21.75 mV
Lead Channel Sensing Intrinsic Amplitude: 5.375 mV
Lead Channel Setting Pacing Amplitude: 1.5 V
Lead Channel Setting Pacing Amplitude: 2.5 V
Lead Channel Setting Sensing Sensitivity: 1.2 mV
MDC IDC LEAD IMPLANT DT: 20180921
MDC IDC LEAD LOCATION: 753859
MDC IDC MSMT LEADCHNL RA IMPEDANCE VALUE: 342 Ohm
MDC IDC MSMT LEADCHNL RA PACING THRESHOLD PULSEWIDTH: 0.4 ms
MDC IDC MSMT LEADCHNL RV PACING THRESHOLD PULSEWIDTH: 0.4 ms
MDC IDC MSMT LEADCHNL RV SENSING INTR AMPL: 21.75 mV
MDC IDC SESS DTM: 20190723152454
MDC IDC SET LEADCHNL RV PACING PULSEWIDTH: 0.4 ms
MDC IDC STAT BRADY AP VS PERCENT: 0 %
MDC IDC STAT BRADY AS VP PERCENT: 90.66 %
MDC IDC STAT BRADY RA PERCENT PACED: 8.35 %

## 2017-08-26 NOTE — Patient Instructions (Signed)
Medication Instructions:  Your physician recommends that you continue on your current medications as directed. Please refer to the Current Medication list given to you today.   Labwork: NONE   Testing/Procedures: NONE   Follow-Up: NONE   Any Other Special Instructions Will Be Listed Below (If Applicable).     If you need a refill on your cardiac medications before your next appointment, please call your pharmacy.  Thank you for choosing [facility]!

## 2017-08-26 NOTE — Progress Notes (Signed)
HPI Rebecca [name] returns today for followup of her PPM. She is a pleasant 78 yo woman with a h/o CHB, s/p PPM insertion. The patient c/o problems with skin bleeding from her varicose veins. She has had some vision problems and was told she has cataracts. She also has chronic diastolic heart failure.  Allergies  Allergen Reactions  . Codeine Anxiety  . Compazine Anaphylaxis  . Other Anaphylaxis, Rash and Other (See Comments)    Uncoded Allergy. Allergen: INOVAR Uncoded Allergy. Allergen: ALL ADHESIVES Uncoded Allergy. Allergen: SILK SUTURE Uncoded Allergy. Allergen: merthiolate Thermasol  . Penicillins Shortness Of Breath and Rash    Has patient had a PCN reaction causing immediate rash, facial/tongue/throat swelling, SOB or lightheadedness with hypotension: Yes Has patient had a PCN reaction causing severe rash involving mucus membranes or skin necrosis: No Has patient had a PCN reaction that required hospitalization No Has patient had a PCN reaction occurring within the last 10 years: Yes If all of the above answers are "NO", then may proceed with Cephalosporin use.   Marland Kitchen Morphine And Related Other (See Comments)    Patient states that it makes her lose her state of mind.   . Demerol [Meperidine] Rash     Current Outpatient Medications  Medication Sig Dispense Refill  . BYDUREON 2 MG PEN Inject 2 mg into the skin once a week.  5  . carvedilol (COREG) 3.125 MG tablet TAKE 1 TABLET(3.125 MG) BY MOUTH TWICE DAILY WITH A MEAL 60 tablet 6  . celecoxib (CELEBREX) 200 MG capsule Take 200 mg by mouth daily. For arthritis    . Cholecalciferol (VITAMIN D3) 3000 UNITS TABS Take 1 capsule by mouth daily.    . clomiPRAMINE (ANAFRANIL) 25 MG capsule Take 25 mg by mouth 4 (four) times daily.     . clonazePAM (KLONOPIN) 1 MG tablet Take 1 mg by mouth at bedtime.     . Coenzyme Q10 (CO Q10) 200 MG CAPS Take 2 capsules by mouth daily.     . DULoxetine (CYMBALTA) 60 MG capsule Take 60 mg by  mouth daily.      . ferrous sulfate 325 (65 FE) MG tablet Take 325 mg by mouth daily with breakfast.    . furosemide (LASIX) 40 MG tablet Take 20 mg by mouth 2 (two) times daily.   5  . Hyaluronic Acid-Vitamin C (HYALURONIC ACID PO) Take 100 mg by mouth daily.    Marland Kitchen HYDROcodone-acetaminophen (NORCO/VICODIN) 5-325 MG tablet Take 1 tablet by mouth every 4 (four) hours as needed. 15 tablet 0  . insulin glargine (LANTUS) 100 unit/mL SOPN Inject 24 Units into the skin at bedtime.     . magnesium oxide (MAG-OX) 400 MG tablet Take 400 mg by mouth daily.    . metFORMIN (GLUCOPHAGE) 500 MG tablet Take 500 mg by mouth 2 (two) times daily with a meal.      . mirtazapine (REMERON) 15 MG tablet Take 15 mg by mouth at bedtime.      . Multiple Vitamin (MULTIVITAMIN WITH MINERALS) TABS tablet Take 1 tablet by mouth daily.    . potassium chloride (K-DUR) 10 MEQ tablet Take 1 tablet (10 mEq total) by mouth daily. 90 tablet 3  . Probiotic Product (PROBIOTIC ADVANCED PO) Take 1 capsule by mouth daily.    . QUEtiapine (SEROQUEL) 100 MG tablet Take 100 mg by mouth at bedtime.    Marland Kitchen warfarin (COUMADIN) 5 MG tablet Take 2.5-5 mg by mouth every evening.  Starting 10/29/2016 take 2.5 mg alternating with 5 mg daily      No current facility-administered medications for this visit.      Past Medical History:  Diagnosis Date  . Aortic stenosis   . Cataplexy   . Diastolic heart failure (HCC)   . Essential hypertension   . GERD (gastroesophageal reflux disease)   . Hiatal hernia   . History of recurrent deep vein thrombosis (DVT)   . Mitral stenosis   . Narcolepsy   . Neuropathy   . Type 2 diabetes mellitus (HCC)     ROS:   All systems reviewed and negative except as noted in the HPI.   Past Surgical History:  Procedure Laterality Date  . ABDOMINAL HYSTERECTOMY    . CARPAL TUNNEL RELEASE    . CARPECTOMY  12/26/2011   Procedure: CARPECTOMY;  Surgeon: Wyn Forster., MD;  Location: [facility];  Service: Orthopedics;  Laterality: Right;  PROXIMAL ROW CARPECTOMY RIGHT WRIST and neurectomy  . CHOLECYSTECTOMY    . HEMORROIDECTOMY    . KIDNEY SURGERY    . NEPHROSTOMY    . PACEMAKER IMPLANT N/A 10/25/2016   Procedure: Pacemaker Implant;  Surgeon: Regan Lemming, MD;  Location: [facility] INVASIVE CV LAB;  Service: Cardiovascular;  Laterality: N/A;  . TOTAL HIP ARTHROPLASTY     bilateral     Family History  Problem Relation Age of Onset  . Heart disease Father   . Diabetes Unknown   . Stroke Mother   . Scleroderma Brother      Social History   Socioeconomic History  . Marital status: Widowed    Spouse name: Not on file  . Number of children: Not on file  . Years of education: 50  . Highest education level: Not on file  Occupational History    Employer: RETIRED  Social Needs  . Financial resource strain: Not on file  . Food insecurity:    Worry: Not on file    Inability: Not on file  . Transportation needs:    Medical: Not on file    Non-medical: Not on file  Tobacco Use  . Smoking status: Former Smoker    Last attempt to quit: 10/13/1988    Years since quitting: 28.8  . Smokeless tobacco: Never Used  Substance and Sexual Activity  . Alcohol use: No    Alcohol/week: 0.0 oz  . Drug use: No  . Sexual activity: Not on file  Lifestyle  . Physical activity:    Days per week: Not on file    Minutes per session: Not on file  . Stress: Not on file  Relationships  . Social connections:    Talks on phone: Not on file    Gets together: Not on file    Attends religious service: Not on file    Active member of club or organization: Not on file    Attends meetings of clubs or organizations: Not on file    Relationship status: Not on file  . Intimate partner violence:    Fear of current or ex partner: Not on file    Emotionally abused: Not on file    Physically abused: Not on file    Forced sexual activity: Not on file  Other Topics Concern  . Not on file    Social History Narrative  . Not on file     BP 130/74 (BP Location: Left Arm)   Pulse 85   Ht 5\' 4"  (1.626  m)   Wt 234 lb (106.1 kg)   SpO2 92%   BMI 40.17 kg/m   Physical Exam:  Well appearing NAD HEENT: Unremarkable Neck:  No JVD, no thyromegally Lymphatics:  No adenopathy Back:  No CVA tenderness Lungs:  Clear HEART:  Regular rate rhythm, no murmurs, no rubs, no clicks Abd:  soft, positive bowel sounds, no organomegally, no rebound, no guarding Ext:  2 plus pulses, no edema, no cyanosis, no clubbing Skin:  No rashes no nodules Neuro:  CN II through XII intact, motor grossly intact  EKG - none  DEVICE  Normal device function.  See PaceArt for details.   Assess/Plan: 1. Atrial fib - she has underlying CHB. Her rates are controlled. She will continue her anti-coagulation. 2. Mitral stenosis - she is being followed by Dr. Darl Householder. She has an echo peding. 3. PPM - her Medtronic MRI compatible PM is working normally. 4. Diastolic heart failure - her symptoms are class 2. She will continue her current meds. She will maintain a low sodium diet.  Leonia Reeves.D.

## 2017-10-21 ENCOUNTER — Ambulatory Visit (INDEPENDENT_AMBULATORY_CARE_PROVIDER_SITE_OTHER): Payer: Medicare Other | Admitting: *Deleted

## 2017-10-21 DIAGNOSIS — I442 Atrioventricular block, complete: Secondary | ICD-10-CM | POA: Diagnosis not present

## 2017-10-22 NOTE — Progress Notes (Signed)
Remote pacemaker transmission.   

## 2017-11-12 LAB — CUP PACEART REMOTE DEVICE CHECK
Battery Remaining Longevity: 126 mo
Battery Voltage: 3.03 V
Brady Statistic AP VS Percent: 0 %
Brady Statistic AS VS Percent: 0.31 %
Date Time Interrogation Session: 20190917112148
Implantable Lead Implant Date: 20180921
Implantable Lead Location: 753859
Lead Channel Impedance Value: 475 Ohm
Lead Channel Pacing Threshold Pulse Width: 0.4 ms
Lead Channel Sensing Intrinsic Amplitude: 21.75 mV
Lead Channel Sensing Intrinsic Amplitude: 4 mV
Lead Channel Sensing Intrinsic Amplitude: 4 mV
Lead Channel Setting Sensing Sensitivity: 1.2 mV
MDC IDC LEAD IMPLANT DT: 20180921
MDC IDC LEAD LOCATION: 753860
MDC IDC MSMT LEADCHNL RA IMPEDANCE VALUE: 342 Ohm
MDC IDC MSMT LEADCHNL RA IMPEDANCE VALUE: 399 Ohm
MDC IDC MSMT LEADCHNL RA PACING THRESHOLD AMPLITUDE: 0.375 V
MDC IDC MSMT LEADCHNL RV IMPEDANCE VALUE: 380 Ohm
MDC IDC MSMT LEADCHNL RV PACING THRESHOLD AMPLITUDE: 0.625 V
MDC IDC MSMT LEADCHNL RV PACING THRESHOLD PULSEWIDTH: 0.4 ms
MDC IDC MSMT LEADCHNL RV SENSING INTR AMPL: 21.75 mV
MDC IDC PG IMPLANT DT: 20180921
MDC IDC SET LEADCHNL RA PACING AMPLITUDE: 1.5 V
MDC IDC SET LEADCHNL RV PACING AMPLITUDE: 2.5 V
MDC IDC SET LEADCHNL RV PACING PULSEWIDTH: 0.4 ms
MDC IDC STAT BRADY AP VP PERCENT: 10.93 %
MDC IDC STAT BRADY AS VP PERCENT: 88.76 %
MDC IDC STAT BRADY RA PERCENT PACED: 10.95 %
MDC IDC STAT BRADY RV PERCENT PACED: 99.69 %

## 2017-12-15 ENCOUNTER — Encounter: Payer: Self-pay | Admitting: Cardiovascular Disease

## 2017-12-15 ENCOUNTER — Ambulatory Visit: Payer: Medicare Other | Admitting: Cardiovascular Disease

## 2017-12-15 VITALS — BP 140/60 | HR 71 | Ht 67.0 in | Wt 235.0 lb

## 2017-12-15 DIAGNOSIS — I5032 Chronic diastolic (congestive) heart failure: Secondary | ICD-10-CM

## 2017-12-15 DIAGNOSIS — I10 Essential (primary) hypertension: Secondary | ICD-10-CM

## 2017-12-15 DIAGNOSIS — I342 Nonrheumatic mitral (valve) stenosis: Secondary | ICD-10-CM | POA: Diagnosis not present

## 2017-12-15 DIAGNOSIS — Z950 Presence of cardiac pacemaker: Secondary | ICD-10-CM

## 2017-12-15 DIAGNOSIS — I38 Endocarditis, valve unspecified: Secondary | ICD-10-CM

## 2017-12-15 DIAGNOSIS — I351 Nonrheumatic aortic (valve) insufficiency: Secondary | ICD-10-CM

## 2017-12-15 DIAGNOSIS — I442 Atrioventricular block, complete: Secondary | ICD-10-CM

## 2017-12-15 DIAGNOSIS — I350 Nonrheumatic aortic (valve) stenosis: Secondary | ICD-10-CM | POA: Diagnosis not present

## 2017-12-15 DIAGNOSIS — Z86718 Personal history of other venous thrombosis and embolism: Secondary | ICD-10-CM

## 2017-12-15 DIAGNOSIS — Z7901 Long term (current) use of anticoagulants: Secondary | ICD-10-CM

## 2017-12-15 NOTE — Progress Notes (Signed)
SUBJECTIVE: The patient presents for routine follow-up.  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  She also has a history of chronic diastolic heart failure, hypertensive heart disease, DVTs, and valvular heart disease with moderate to severe mitral stenosis.  Echocardiogram 08/20/17 showed normal LV systolic function, LVEF 60-65%, mild aortic stenosis with moderate regurgitation, moderate to severe mitral stenosis with mild regurgitation, and severe atrial dilatation with moderate pulmonary hypertension (42 mmHg).   She uses a walker to ambulate.  Her son lives with her and she has a very attentive granddaughter who lives next door who is present today Truett Mainland).   She is doing well overall.  Her granddaughter says that the patient's shortness of breath is actually gotten less.  She said she is no longer panting.  She denies chest pain or palpitations.  She denies leg swelling.  She sleeps on one small pillow lying flat.     Review of Systems: As per "subjective", otherwise negative.  Allergies  Allergen Reactions  . Codeine Anxiety  . Compazine Anaphylaxis  . Other Anaphylaxis, Rash and Other (See Comments)    Uncoded Allergy. Allergen: INOVAR Uncoded Allergy. Allergen: ALL ADHESIVES Uncoded Allergy. Allergen: SILK SUTURE Uncoded Allergy. Allergen: merthiolate Thermasol  . Penicillins Shortness Of Breath and Rash    Has patient had a PCN reaction causing immediate rash, facial/tongue/throat swelling, SOB or lightheadedness with hypotension: Yes Has patient had a PCN reaction causing severe rash involving mucus membranes or skin necrosis: No Has patient had a PCN reaction that required hospitalization No Has patient had a PCN reaction occurring within the last 10 years: Yes If all of the above answers are "NO", then may proceed with Cephalosporin use.   Marland Kitchen Morphine And Related Other (See Comments)    Patient states that  it makes her lose her state of mind.   . Demerol [Meperidine] Rash    Current Outpatient Medications  Medication Sig Dispense Refill  . BYDUREON 2 MG PEN Inject 2 mg into the skin once a week.  5  . carvedilol (COREG) 3.125 MG tablet TAKE 1 TABLET(3.125 MG) BY MOUTH TWICE DAILY WITH A MEAL 60 tablet 6  . celecoxib (CELEBREX) 200 MG capsule Take 200 mg by mouth daily. For arthritis    . Cholecalciferol (VITAMIN D3) 3000 UNITS TABS Take 1 capsule by mouth daily.    . clomiPRAMINE (ANAFRANIL) 25 MG capsule Take 25 mg by mouth 4 (four) times daily.     . clonazePAM (KLONOPIN) 1 MG tablet Take 1 mg by mouth at bedtime.     . Coenzyme Q10 (CO Q10) 200 MG CAPS Take 2 capsules by mouth daily.     . DULoxetine (CYMBALTA) 60 MG capsule Take 60 mg by mouth daily.      . ferrous sulfate 325 (65 FE) MG tablet Take 325 mg by mouth daily with breakfast.    . furosemide (LASIX) 40 MG tablet Take 20 mg by mouth 2 (two) times daily.   5  . Hyaluronic Acid-Vitamin C (HYALURONIC ACID PO) Take 100 mg by mouth daily.    Marland Kitchen HYDROcodone-acetaminophen (NORCO/VICODIN) 5-325 MG tablet Take 1 tablet by mouth every 4 (four) hours as needed. 15 tablet 0  . insulin glargine (LANTUS) 100 unit/mL SOPN Inject 24 Units into the skin at bedtime.     . magnesium oxide (MAG-OX) 400 MG tablet Take 400 mg by mouth daily.    . metFORMIN (  GLUCOPHAGE) 500 MG tablet Take 500 mg by mouth 2 (two) times daily with a meal.      . mirtazapine (REMERON) 15 MG tablet Take 15 mg by mouth at bedtime.      . Multiple Vitamin (MULTIVITAMIN WITH MINERALS) TABS tablet Take 1 tablet by mouth daily.    . potassium chloride (K-DUR) 10 MEQ tablet Take 1 tablet (10 mEq total) by mouth daily. 90 tablet 3  . Probiotic Product (PROBIOTIC ADVANCED PO) Take 1 capsule by mouth daily.    . QUEtiapine (SEROQUEL) 100 MG tablet Take 100 mg by mouth at bedtime.    Marland Kitchen warfarin (COUMADIN) 5 MG tablet Take 2.5-5 mg by mouth every evening. Starting 10/29/2016 take 2.5  mg alternating with 5 mg daily      No current facility-administered medications for this visit.     Past Medical History:  Diagnosis Date  . Aortic stenosis   . Cataplexy   . Diastolic heart failure (HCC)   . Essential hypertension   . GERD (gastroesophageal reflux disease)   . Hiatal hernia   . History of recurrent deep vein thrombosis (DVT)   . Mitral stenosis   . Narcolepsy   . Neuropathy   . Type 2 diabetes mellitus (HCC)     Past Surgical History:  Procedure Laterality Date  . ABDOMINAL HYSTERECTOMY    . CARPAL TUNNEL RELEASE    . CARPECTOMY  12/26/2011   Procedure: CARPECTOMY;  Surgeon: Wyn Forster., MD;  Location:  SURGERY CENTER;  Service: Orthopedics;  Laterality: Right;  PROXIMAL ROW CARPECTOMY RIGHT WRIST and neurectomy  . CHOLECYSTECTOMY    . HEMORROIDECTOMY    . KIDNEY SURGERY    . NEPHROSTOMY    . PACEMAKER IMPLANT N/A 10/25/2016   Procedure: Pacemaker Implant;  Surgeon: Regan Lemming, MD;  [facility]: [facility] INVASIVE CV LAB;  Service: Cardiovascular;  Laterality: N/A;  . TOTAL HIP ARTHROPLASTY     bilateral    Social History   Socioeconomic History  . Marital status: Widowed    Spouse name: Not on file  . Number of children: Not on file  . Years of education: 14  . Highest education level: Not on file  Occupational History    Employer: RETIRED  Social Needs  . Financial resource strain: Not on file  . Food insecurity:    Worry: Not on file    Inability: Not on file  . Transportation needs:    Medical: Not on file    Non-medical: Not on file  Tobacco Use  . Smoking status: Former Smoker    Last attempt to quit: 10/13/1988    Years since quitting: 29.1  . Smokeless tobacco: Never Used  Substance and Sexual Activity  . Alcohol use: No    Alcohol/week: 0.0 standard drinks  . Drug use: No  . Sexual activity: Not on file  Lifestyle  . Physical activity:    Days per week: Not on file    Minutes per session: Not on file  .  Stress: Not on file  Relationships  . Social connections:    Talks on phone: Not on file    Gets together: Not on file    Attends religious service: Not on file    Active member of club or organization: Not on file    Attends meetings of clubs or organizations: Not on file    Relationship status: Not on file  . Intimate partner violence:    Fear of current  or ex partner: Not on file    Emotionally abused: Not on file    Physically abused: Not on file    Forced sexual activity: Not on file  Other Topics Concern  . Not on file  Social History Narrative  . Not on file     Vitals:   12/15/17 1400  BP: 140/60  Pulse: 71  SpO2: 93%  Weight: 235 lb (106.6 kg)  Height: 5\' 7"  (1.702 m)    Wt Readings from Last 3 Encounters:  12/15/17 235 lb (106.6 kg)  08/26/17 234 lb (106.1 kg)  08/18/17 238 lb (108 kg)     PHYSICAL EXAM General: NAD HEENT: Normal. Neck: No JVD, no thyromegaly. Lungs: Clear to auscultation bilaterally with normal respiratory effort. CV: Regular rate and rhythm, normal S1/S2, no S3/S4, 2/6 systolic murmur heard over right upper sternal border and along left sternal border murmur. Stasis dermatitis of bilateral lower extremities, left greater than right.  Bilateral venous varicosities.  Abdomen: Soft, nontender, no distention.  Neurologic: Alert and oriented.  Psych: Normal affect. Skin: Stasis dermatitis of bilateral lower extremities, left greater than right. Musculoskeletal: No gross deformities.    ECG: Reviewed above under Subjective   Labs: Lab Results  Component Value Date/Time   K 3.7 04/03/2017 04:50 AM   BUN 14 04/03/2017 04:50 AM   BUN 29 (H) 11/08/2014 09:31 AM   CREATININE 0.66 04/03/2017 04:50 AM   CREATININE 0.95 07/29/2012 11:30 AM   ALT 15 03/29/2017 06:04 AM   TSH 1.121 10/25/2010 05:31 AM   HGB 11.4 (L) 03/30/2017 06:34 AM     Lipids: No results found for: LDLCALC, LDLDIRECT, CHOL, TRIG, HDL     ASSESSMENT AND  PLAN:  1.  Chronic diastolic heart failure: Euvolemic on Lasix 20 mg twice daily.  Blood pressure is reasonably controlled on carvedilol.  No changes to therapy.  2.  Moderate to severe mitral stenosis: Echocardiogram from July 2019 reviewed above.  She is at risk for atrial fibrillation (also has severe left atrial enlargement).  She is already anticoagulated with warfarin.  She is symptomatically stable.    3.  Aortic valve disease with mild stenosis and moderate regurgitation: Echocardiogram from July 2019 reviewed above  4.  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 Interrogation dated 10/21/2017 reviewed.  5.  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  6.  Hypertension: Blood pressure is reasonable.  No changes to therapy.    Disposition: Follow up 4 months   Prentice Docker, M.D., F.A.C.C.

## 2017-12-15 NOTE — Patient Instructions (Signed)
Medication Instructions:  Your physician recommends that you continue on your current medications as directed. Please refer to the Current Medication list given to you today.  If you need a refill on your cardiac medications before your next appointment, please call your pharmacy.   Lab work: non3 If you have labs (blood work) drawn today and your tests are completely normal, you will receive your results only by: Marland Kitchen MyChart Message (if you have MyChart) OR . A paper copy in the mail If you have any lab test that is abnormal or we need to change your treatment, we will call you to review the results.  Testing/Procedures: none  Follow-Up: At [facility], you and your health needs are our priority.  As part of our continuing mission to provide you with exceptional heart care, we have created designated Provider Care Teams.  These Care Teams include your primary Cardiologist (physician) and Advanced Practice Providers (APPs -  Physician Assistants and Nurse Practitioners) who all work together to provide you with the care you need, when you need it. You will need a follow up appointment in 4 months.  Please call our office 2 months in advance to schedule this appointment.  You may see Prentice Docker, MD or one of the following Advanced Practice Providers on your designated Care Team:   Randall An, PA-C [facility]) . Jacolyn Reedy, PA-C [facility] Office)  Any Other Special Instructions Will Be Listed Below (If Applicable). none

## 2018-01-20 ENCOUNTER — Telehealth: Payer: Self-pay

## 2018-01-20 ENCOUNTER — Ambulatory Visit (INDEPENDENT_AMBULATORY_CARE_PROVIDER_SITE_OTHER): Payer: Medicare Other

## 2018-01-20 DIAGNOSIS — I442 Atrioventricular block, complete: Secondary | ICD-10-CM

## 2018-01-20 NOTE — Telephone Encounter (Signed)
LMOVM reminding pt to send remote transmission.   

## 2018-01-21 ENCOUNTER — Encounter: Payer: Self-pay | Admitting: Cardiology

## 2018-01-21 NOTE — Progress Notes (Signed)
Remote pacemaker transmission.   

## 2018-02-13 ENCOUNTER — Other Ambulatory Visit: Payer: Self-pay | Admitting: Cardiovascular Disease

## 2018-02-18 ENCOUNTER — Other Ambulatory Visit: Payer: Self-pay | Admitting: Family Medicine

## 2018-02-18 DIAGNOSIS — Z1231 Encounter for screening mammogram for malignant neoplasm of breast: Secondary | ICD-10-CM

## 2018-02-22 LAB — CUP PACEART REMOTE DEVICE CHECK
Brady Statistic AP VP Percent: 14.09 %
Brady Statistic AP VS Percent: 0 %
Brady Statistic AS VP Percent: 85.75 %
Brady Statistic AS VS Percent: 0.16 %
Brady Statistic RA Percent Paced: 13.97 %
Brady Statistic RV Percent Paced: 99.84 %
Date Time Interrogation Session: 20191217193552
Implantable Lead Implant Date: 20180921
Implantable Lead Implant Date: 20180921
Implantable Lead Location: 753860
Implantable Lead Model: 5076
Lead Channel Impedance Value: 342 Ohm
Lead Channel Impedance Value: 380 Ohm
Lead Channel Impedance Value: 380 Ohm
Lead Channel Impedance Value: 494 Ohm
Lead Channel Pacing Threshold Amplitude: 0.625 V
Lead Channel Pacing Threshold Pulse Width: 0.4 ms
Lead Channel Sensing Intrinsic Amplitude: 0.75 mV
Lead Channel Sensing Intrinsic Amplitude: 21.75 mV
Lead Channel Sensing Intrinsic Amplitude: 21.75 mV
Lead Channel Setting Pacing Amplitude: 2.5 V
Lead Channel Setting Sensing Sensitivity: 1.2 mV
MDC IDC LEAD LOCATION: 753859
MDC IDC MSMT BATTERY REMAINING LONGEVITY: 124 mo
MDC IDC MSMT BATTERY VOLTAGE: 3.02 V
MDC IDC MSMT LEADCHNL RA PACING THRESHOLD AMPLITUDE: 0.375 V
MDC IDC MSMT LEADCHNL RA PACING THRESHOLD PULSEWIDTH: 0.4 ms
MDC IDC MSMT LEADCHNL RA SENSING INTR AMPL: 0.75 mV
MDC IDC PG IMPLANT DT: 20180921
MDC IDC SET LEADCHNL RA PACING AMPLITUDE: 1.5 V
MDC IDC SET LEADCHNL RV PACING PULSEWIDTH: 0.4 ms

## 2018-03-02 ENCOUNTER — Emergency Department (HOSPITAL_COMMUNITY): Payer: Medicare Other

## 2018-03-02 ENCOUNTER — Emergency Department (HOSPITAL_COMMUNITY)
Admission: EM | Admit: 2018-03-02 | Discharge: 2018-03-02 | Disposition: A | Discharge status: Home / Self Care | Payer: Medicare Other | Attending: Emergency Medicine | Admitting: Emergency Medicine

## 2018-03-02 ENCOUNTER — Other Ambulatory Visit: Payer: Self-pay

## 2018-03-02 ENCOUNTER — Encounter (HOSPITAL_COMMUNITY): Payer: Self-pay

## 2018-03-02 DIAGNOSIS — I130 Hypertensive heart and chronic kidney disease with heart failure and stage 1 through stage 4 chronic kidney disease, or unspecified chronic kidney disease: Secondary | ICD-10-CM | POA: Insufficient documentation

## 2018-03-02 DIAGNOSIS — Z9049 Acquired absence of other specified parts of digestive tract: Secondary | ICD-10-CM | POA: Insufficient documentation

## 2018-03-02 DIAGNOSIS — Z87891 Personal history of nicotine dependence: Secondary | ICD-10-CM | POA: Diagnosis not present

## 2018-03-02 DIAGNOSIS — E1122 Type 2 diabetes mellitus with diabetic chronic kidney disease: Secondary | ICD-10-CM | POA: Diagnosis not present

## 2018-03-02 DIAGNOSIS — R339 Retention of urine, unspecified: Secondary | ICD-10-CM | POA: Diagnosis not present

## 2018-03-02 DIAGNOSIS — R1030 Lower abdominal pain, unspecified: Secondary | ICD-10-CM | POA: Diagnosis not present

## 2018-03-02 DIAGNOSIS — Z950 Presence of cardiac pacemaker: Secondary | ICD-10-CM | POA: Diagnosis not present

## 2018-03-02 DIAGNOSIS — Z96643 Presence of artificial hip joint, bilateral: Secondary | ICD-10-CM | POA: Insufficient documentation

## 2018-03-02 DIAGNOSIS — R1031 Right lower quadrant pain: Secondary | ICD-10-CM | POA: Insufficient documentation

## 2018-03-02 DIAGNOSIS — N183 Chronic kidney disease, stage 3 (moderate): Secondary | ICD-10-CM | POA: Diagnosis not present

## 2018-03-02 DIAGNOSIS — I5032 Chronic diastolic (congestive) heart failure: Secondary | ICD-10-CM | POA: Insufficient documentation

## 2018-03-02 DIAGNOSIS — R109 Unspecified abdominal pain: Secondary | ICD-10-CM

## 2018-03-02 HISTORY — DX: Disorder of kidney and ureter, unspecified: N28.9

## 2018-03-02 LAB — URINALYSIS, ROUTINE W REFLEX MICROSCOPIC
Bacteria, UA: NONE SEEN
Bilirubin Urine: NEGATIVE
Glucose, UA: 50 mg/dL — AB
Hgb urine dipstick: NEGATIVE
Ketones, ur: NEGATIVE mg/dL
Leukocytes, UA: NEGATIVE
Nitrite: POSITIVE — AB
Protein, ur: NEGATIVE mg/dL
Specific Gravity, Urine: 1.023 (ref 1.005–1.030)
pH: 5 (ref 5.0–8.0)

## 2018-03-02 LAB — CBC WITH DIFFERENTIAL/PLATELET
Abs Immature Granulocytes: 0.03 10*3/uL (ref 0.00–0.07)
Basophils Absolute: 0.1 10*3/uL (ref 0.0–0.1)
Basophils Relative: 1 %
Eosinophils Absolute: 0.2 10*3/uL (ref 0.0–0.5)
Eosinophils Relative: 2 %
HCT: 40 % (ref 36.0–46.0)
Hemoglobin: 13 g/dL (ref 12.0–15.0)
Immature Granulocytes: 0 %
Lymphocytes Relative: 15 %
Lymphs Abs: 1.6 10*3/uL (ref 0.7–4.0)
MCH: 30.7 pg (ref 26.0–34.0)
MCHC: 32.5 g/dL (ref 30.0–36.0)
MCV: 94.6 fL (ref 80.0–100.0)
Monocytes Absolute: 0.9 10*3/uL (ref 0.1–1.0)
Monocytes Relative: 8 %
Neutro Abs: 8.1 10*3/uL — ABNORMAL HIGH (ref 1.7–7.7)
Neutrophils Relative %: 74 %
Platelets: 214 10*3/uL (ref 150–400)
RBC: 4.23 MIL/uL (ref 3.87–5.11)
RDW: 13.7 % (ref 11.5–15.5)
WBC: 10.9 10*3/uL — ABNORMAL HIGH (ref 4.0–10.5)
nRBC: 0 % (ref 0.0–0.2)

## 2018-03-02 LAB — COMPREHENSIVE METABOLIC PANEL WITH GFR
ALT: 26 U/L (ref 0–44)
AST: 48 U/L — ABNORMAL HIGH (ref 15–41)
Albumin: 3.3 g/dL — ABNORMAL LOW (ref 3.5–5.0)
Alkaline Phosphatase: 46 U/L (ref 38–126)
Anion gap: 9 (ref 5–15)
BUN: 28 mg/dL — ABNORMAL HIGH (ref 8–23)
CO2: 24 mmol/L (ref 22–32)
Calcium: 8.4 mg/dL — ABNORMAL LOW (ref 8.9–10.3)
Chloride: 102 mmol/L (ref 98–111)
Creatinine, Ser: 1.15 mg/dL — ABNORMAL HIGH (ref 0.44–1.00)
GFR calc Af Amer: 53 mL/min — ABNORMAL LOW
GFR calc non Af Amer: 46 mL/min — ABNORMAL LOW
Glucose, Bld: 208 mg/dL — ABNORMAL HIGH (ref 70–99)
Potassium: 3.4 mmol/L — ABNORMAL LOW (ref 3.5–5.1)
Sodium: 135 mmol/L (ref 135–145)
Total Bilirubin: 1 mg/dL (ref 0.3–1.2)
Total Protein: 6.8 g/dL (ref 6.5–8.1)

## 2018-03-02 LAB — LIPASE, BLOOD: Lipase: 25 U/L (ref 11–51)

## 2018-03-02 MED ORDER — SODIUM CHLORIDE 0.9 % IV BOLUS
500.0000 mL | Freq: Once | INTRAVENOUS | Status: AC
  Administered 2018-03-02: 500 mL via INTRAVENOUS

## 2018-03-02 MED ORDER — CEPHALEXIN 500 MG PO CAPS: 500.0000 mg | ORAL_CAPSULE | Freq: Three times a day (TID) | ORAL | 0 refills | Status: AC

## 2018-03-02 MED ORDER — IOHEXOL 300 MG/ML  SOLN
75.0000 mL | Freq: Once | INTRAMUSCULAR | Status: AC | PRN
  Administered 2018-03-02: 75 mL via INTRAVENOUS

## 2018-03-02 NOTE — Discharge Instructions (Signed)
You were seen in the ED today with abdominal pain. You are retaining urine and you required a foley catheter to be inserted. Call the Urologist and your PCP today. The urologist will schedule an appointment to evaluate you and removing the foley in the next 1-2 weeks. Return to the ED with any new or worsening symptoms.

## 2018-03-02 NOTE — ED Provider Notes (Signed)
Emergency Department Provider Note   I have reviewed the triage vital signs and the nursing notes.   HISTORY  Chief Complaint Flank Pain   HPI Rebecca [name] is a 78 y.o. female with PMH ofwith PMH dCHF, HTN, DM, and GERD resents to the emergency department with right flank pain worsening over the last several days with associated decreased urine output.  The patient's granddaughter/caregiver is at bedside and states that she typically urinates frequently but over the past 12 to 24 hours it has been less frequent.  Patient did have some urine incontinence yesterday evening which was slightly unusual for her.  She denies any dysuria or hesitancy.  She did urinate this morning without blood or other abnormality.  Her pain is mainly in the right flank.  She notes a remote history of hysterectomy with inadvertent damage to the right ureter.  She does not follow with a urologist regularly and has not had problems in a long time.  She does have history of kidney stones and states that the pain is somewhat similar but the severity is not as high.  She denies any fevers or chills.  No chest pain or shortness of breath.  Past Medical History:  Diagnosis Date  . Aortic stenosis   . Cataplexy   . Diastolic heart failure (HCC)   . Essential hypertension   . GERD (gastroesophageal reflux disease)   . Hiatal hernia   . History of recurrent deep vein thrombosis (DVT)   . Mitral stenosis   . Narcolepsy   . Neuropathy   . Renal disorder   . Type 2 diabetes mellitus [facility])     Patient Active Problem List   Diagnosis Date Noted  . HCAP (healthcare-associated pneumonia) 03/29/2017  . Cellulitis 03/03/2017  . Type 2 diabetes mellitus with other specified complication (HCC) 03/03/2017  . CKD (chronic kidney disease), stage III (HCC) 03/03/2017  . Status post placement of cardiac pacemaker 10/29/2016  . Pulmonary edema 10/23/2016  . CAP (community acquired pneumonia) 02/22/2015  . Fall at home  09/21/2014  . Mild aortic stenosis 09/08/2014  . Mild to moderate  mitral stenosis 09/08/2014  . Chronic anticoagulation-Coumadin 09/08/2014  . Sepsis (HCC) 09/05/2014  . Lower leg DVT (deep venous thromboembolism), chronic (HCC) 09/05/2014  . Essential hypertension 09/05/2014  . Depression 09/05/2014  . Poorly controlled diabetes mellitus (HCC) 09/05/2014  . Hypomagnesemia 09/05/2014  . Hyponatremia 09/05/2014  . Pyelonephritis 09/05/2014  . Chronic diastolic CHF (congestive heart failure) (HCC)   . 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 GERD (gastroesophageal reflux disease) 07/08/2011  . Gastroparesis 07/08/2011  . IDA (iron deficiency anemia) 07/08/2011  . History of colonic polyps 07/08/2011  . Narcolepsy-evaluated at [facility] 07/08/2011  . NAFLD (nonalcoholic fatty liver disease) 24/10/7351  . Obesity-BMI 37 07/08/2011    Past Surgical History:  Procedure Laterality Date  . ABDOMINAL HYSTERECTOMY    . CARPAL TUNNEL RELEASE    . CARPECTOMY  12/26/2011   Procedure: CARPECTOMY;  Surgeon: Wyn Forster., MD;  Location: [facility];  Service: Orthopedics;  Laterality: Right;  PROXIMAL ROW CARPECTOMY RIGHT WRIST and neurectomy  . CHOLECYSTECTOMY    . HEMORROIDECTOMY    . KIDNEY SURGERY    . NEPHROSTOMY    . PACEMAKER IMPLANT N/A 10/25/2016   Procedure: Pacemaker Implant;  Surgeon: Regan Lemming, MD;  Location: [facility] INVASIVE CV LAB;  Service: Cardiovascular;  Laterality: N/A;  . TOTAL HIP ARTHROPLASTY  bilateral   Allergies Codeine; Compazine; Other; Penicillins; Morphine and related; and Demerol [meperidine]  Family History  Problem Relation Age of Onset  . Heart disease Father   . Diabetes Other   . Stroke Mother   . Scleroderma Brother     Social History Social History   Tobacco Use  . Smoking status: Former Smoker    Last attempt to quit: 10/13/1988    Years since quitting: 29.4  . Smokeless tobacco: Never Used    Substance Use Topics  . Alcohol use: No    Alcohol/week: 0.0 standard drinks  . Drug use: No    Review of Systems  Constitutional: No fever/chills Eyes: No visual changes. ENT: No sore throat. Cardiovascular: Denies chest pain. Respiratory: Denies shortness of breath. Gastrointestinal: Positive right abdominal/flank pain.  No nausea, no vomiting.  No diarrhea.  No constipation. Genitourinary: Negative for dysuria. Decreased urine output.  Musculoskeletal: Negative for back pain. Skin: Negative for rash. Neurological: Negative for headaches, focal weakness or numbness.  10-point ROS otherwise negative.  ____________________________________________   PHYSICAL EXAM:  VITAL SIGNS: ED Triage Vitals  Enc Vitals Group     BP 03/02/18 0849 (!) 105/52     Pulse Rate 03/02/18 0849 78     Resp 03/02/18 0849 18     Temp 03/02/18 0849 97.8 F (36.6 C)     Temp Source 03/02/18 0849 Oral     SpO2 03/02/18 0849 95 %     Weight 03/02/18 0846 220 lb (99.8 kg)     Height 03/02/18 0846 5\' 7"  (1.702 m)     Pain Score 03/02/18 0845 2   Constitutional: Alert and oriented. Well appearing and in no acute distress. Eyes: Conjunctivae are normal.  Head: Atraumatic. Nose: No congestion/rhinnorhea. Mouth/Throat: Mucous membranes are moist.  Oropharynx non-erythematous. Neck: No stridor.   Cardiovascular: Normal rate, regular rhythm. Good peripheral circulation. Grossly normal heart sounds.   Respiratory: Normal respiratory effort.  No retractions. Lungs CTAB. Gastrointestinal: Soft with mild/moderate tenderness in the right flank. No rebound or guarding. No distention.  Musculoskeletal: No lower extremity tenderness nor edema. No gross deformities of extremities. Neurologic:  Normal speech and language. No gross focal neurologic deficits are appreciated.  Skin:  Skin is warm, dry and intact. No rash noted.  ____________________________________________   LABS (all labs ordered are listed,  but only abnormal results are displayed)  Labs Reviewed  CBC WITH DIFFERENTIAL/PLATELET - Abnormal; Notable for the following components:      Result Value   WBC 10.9 (*)    Neutro Abs 8.1 (*)    All other components within normal limits  COMPREHENSIVE METABOLIC PANEL - Abnormal; Notable for the following components:   Potassium 3.4 (*)    Glucose, Bld 208 (*)    BUN 28 (*)    Creatinine, Ser 1.15 (*)    Calcium 8.4 (*)    Albumin 3.3 (*)    AST 48 (*)    GFR calc non Af Amer 46 (*)    GFR calc Af Amer 53 (*)    All other components within normal limits  URINALYSIS, ROUTINE W REFLEX MICROSCOPIC - Abnormal; Notable for the following components:   APPearance HAZY (*)    Glucose, UA 50 (*)    Nitrite POSITIVE (*)    All other components within normal limits  LIPASE, BLOOD   ____________________________________________  RADIOLOGY  Ct Abdomen Pelvis W Contrast  Result Date: 03/02/2018 CLINICAL DATA:  Acute abdominal pain and right flank  pain. EXAM: CT ABDOMEN AND PELVIS WITH CONTRAST TECHNIQUE: Multidetector CT imaging of the abdomen and pelvis was performed using the standard protocol following bolus administration of intravenous contrast. CONTRAST:  71mL OMNIPAQUE IOHEXOL 300 MG/ML  SOLN COMPARISON:  Radiographs dated 09/19/2009 and 09/04/2005 and 03/29/2017 FINDINGS: Lower chest: Pacemaker in place. Aortic atherosclerosis. Large hiatal hernia. There are several small patchy areas of infiltrate or scarring in the right middle and right lower lobes. Tiny bilateral pleural effusions. Hepatobiliary: No focal liver abnormality is seen. Status post cholecystectomy. No biliary dilatation. Pancreas: Unremarkable. No pancreatic ductal dilatation or surrounding inflammatory changes. Spleen: Normal in size without focal abnormality. Adrenals/Urinary Tract: 10 mm benign-appearing cyst in the lower pole of the otherwise normal left kidney. Right kidney and adrenal glands and ureters appear normal.  No discrete abnormality of the bladder. Some bladder detail is obscured by the hip prostheses artifacts. Stomach/Bowel: Extensive diverticulosis in the sigmoid portion of the colon. The bowel otherwise appears normal except large hiatal hernia. Appendix and terminal ileum are well visualized and appear normal. Vascular/Lymphatic: Extensive aortic atherosclerosis. No adenopathy. Reproductive: Status post hysterectomy. No adnexal masses. Other: No abdominal wall hernia or abnormality. No abdominopelvic ascites. Musculoskeletal: There is a benign-appearing compression fracture of the superior endplate of T12, most likely subacute. There is extensive degenerative disc disease in the lumbar spine. Bilateral pars defects at L5 with a grade 1 spondylolisthesis at L5-S1. Bilateral total hip prostheses in place. IMPRESSION: 1. No acute abnormality of the abdomen or pelvis. 2. Probable subacute benign-appearing compression fracture of the superior endplate of T12. 3. Large hiatal hernia. 4. Sigmoid diverticulosis. 5.  Aortic Atherosclerosis (ICD10-I70.0). 6. Small patchy densities in the right lower lobe and right middle lobe could represent scarring or small areas of infiltrate. Electronically Signed   By: Francene Boyers M.D.   On: 03/02/2018 11:22    ____________________________________________   PROCEDURES  Procedure(s) performed:   Procedures  None ____________________________________________   INITIAL IMPRESSION / ASSESSMENT AND PLAN / ED COURSE  Pertinent labs & imaging results that were available during my care of the patient were reviewed by me and considered in my medical decision making (see chart for details).  Patient presents to the emergency department for evaluation of right flank/abdominal discomfort.  She has mild tenderness on exam.  Bedside bladder scan shows 83 mLs of urine in the bladder.  No Foley catheter at this time.  Given her focal tenderness and history of ureteral obstruction  I do plan for CT abdomen pelvis for further evaluation.  CT results reviewed. No acute abdominal findings. Specifically, no obstructive uropathy. No PNA symptoms to explain lung findings. Patient still feeling unable to void. Foley cath give 500 ml of urine. Placed indwelling foley with leg bag. Nitrite positive but no other findings of UTI. Plan for cover with keflex given some urine hesitancy and will have the patient f/u with Urology for void trial. Granddaughter at bedside for foley cath care instruction.   Discussed ED return precautions and PCP follow up plan.  ____________________________________________  FINAL CLINICAL IMPRESSION(S) / ED DIAGNOSES  Final diagnoses:  Flank pain  Urinary retention     MEDICATIONS GIVEN DURING THIS VISIT:  Medications  sodium chloride 0.9 % bolus 500 mL ( Intravenous Stopped 03/02/18 1033)  iohexol (OMNIPAQUE) 300 MG/ML solution 75 mL (75 mLs Intravenous Contrast Given 03/02/18 1056)     NEW OUTPATIENT MEDICATIONS STARTED DURING THIS VISIT:  New Prescriptions   CEPHALEXIN (KEFLEX) 500 MG CAPSULE  Take 1 capsule (500 mg total) by mouth 3 (three) times daily for 7 days.    Note:  This document was prepared using Dragon voice recognition software and may include unintentional dictation errors.  Alona Bene, MD Emergency Medicine    Long, Arlyss Repress, MD 03/02/18 [phone]

## 2018-03-02 NOTE — ED Triage Notes (Signed)
Pt reports pain in r flank and abd x 2 weeks.  Reports decreased urinary output.  Pt says has had problems with kidney since having a complication from hysterectomy in her 40's.

## 2018-03-30 ENCOUNTER — Ambulatory Visit
Admission: RE | Admit: 2018-03-30 | Discharge: 2018-03-30 | Disposition: A | Discharge status: Home / Self Care | Payer: Medicare Other | Source: Ambulatory Visit | Attending: Family Medicine | Admitting: Family Medicine

## 2018-03-30 DIAGNOSIS — Z1231 Encounter for screening mammogram for malignant neoplasm of breast: Secondary | ICD-10-CM

## 2018-04-20 ENCOUNTER — Ambulatory Visit: Payer: Medicare Other | Admitting: Cardiovascular Disease

## 2018-04-21 ENCOUNTER — Ambulatory Visit (INDEPENDENT_AMBULATORY_CARE_PROVIDER_SITE_OTHER): Payer: Medicare Other | Admitting: *Deleted

## 2018-04-21 ENCOUNTER — Other Ambulatory Visit: Payer: Self-pay

## 2018-04-21 DIAGNOSIS — I442 Atrioventricular block, complete: Secondary | ICD-10-CM | POA: Diagnosis not present

## 2018-04-22 LAB — CUP PACEART REMOTE DEVICE CHECK
Battery Remaining Longevity: 121 mo
Battery Voltage: 3.01 V
Brady Statistic AP VP Percent: 14.17 %
Brady Statistic AP VS Percent: 0.03 %
Brady Statistic AS VP Percent: 85.55 %
Brady Statistic AS VS Percent: 0.25 %
Brady Statistic RA Percent Paced: 14.17 %
Brady Statistic RV Percent Paced: 99.72 %
Date Time Interrogation Session: 20200316010245
Implantable Lead Implant Date: 20180921
Implantable Lead Implant Date: 20180921
Implantable Lead Location: 753859
Implantable Lead Location: 753860
Implantable Lead Model: 5076
Implantable Lead Model: 5076
Implantable Pulse Generator Implant Date: 20180921
Lead Channel Impedance Value: 361 Ohm
Lead Channel Impedance Value: 399 Ohm
Lead Channel Impedance Value: 494 Ohm
Lead Channel Pacing Threshold Amplitude: 0.375 V
Lead Channel Pacing Threshold Amplitude: 0.5 V
Lead Channel Pacing Threshold Pulse Width: 0.4 ms
Lead Channel Pacing Threshold Pulse Width: 0.4 ms
Lead Channel Sensing Intrinsic Amplitude: 0.875 mV
Lead Channel Sensing Intrinsic Amplitude: 0.875 mV
Lead Channel Sensing Intrinsic Amplitude: 16.75 mV
Lead Channel Sensing Intrinsic Amplitude: 16.75 mV
Lead Channel Setting Pacing Amplitude: 1.5 V
Lead Channel Setting Pacing Amplitude: 2.5 V
Lead Channel Setting Pacing Pulse Width: 0.4 ms
Lead Channel Setting Sensing Sensitivity: 1.2 mV
MDC IDC MSMT LEADCHNL RV IMPEDANCE VALUE: 380 Ohm

## 2018-04-29 ENCOUNTER — Encounter: Payer: Self-pay | Admitting: Cardiology

## 2018-04-29 NOTE — Progress Notes (Signed)
Remote pacemaker transmission.   

## 2018-06-12 ENCOUNTER — Ambulatory Visit: Payer: Medicare Other | Admitting: Cardiovascular Disease

## 2018-07-21 ENCOUNTER — Ambulatory Visit (INDEPENDENT_AMBULATORY_CARE_PROVIDER_SITE_OTHER): Payer: Medicare Other | Admitting: *Deleted

## 2018-07-21 DIAGNOSIS — I442 Atrioventricular block, complete: Secondary | ICD-10-CM | POA: Diagnosis not present

## 2018-07-22 LAB — CUP PACEART REMOTE DEVICE CHECK
Battery Remaining Longevity: 116 mo
Battery Voltage: 3 V
Brady Statistic AP VP Percent: 5.27 %
Brady Statistic AP VS Percent: 0 %
Brady Statistic AS VP Percent: 94.63 %
Brady Statistic AS VS Percent: 0.09 %
Brady Statistic RA Percent Paced: 5.19 %
Brady Statistic RV Percent Paced: 99.9 %
Date Time Interrogation Session: 20200617090131
Implantable Lead Implant Date: 20180921
Implantable Lead Implant Date: 20180921
Implantable Lead Location: 753859
Implantable Lead Location: 753860
Implantable Lead Model: 5076
Implantable Lead Model: 5076
Implantable Pulse Generator Implant Date: 20180921
Lead Channel Impedance Value: 323 Ohm
Lead Channel Impedance Value: 361 Ohm
Lead Channel Impedance Value: 399 Ohm
Lead Channel Impedance Value: 456 Ohm
Lead Channel Pacing Threshold Amplitude: 0.375 V
Lead Channel Pacing Threshold Amplitude: 0.625 V
Lead Channel Pacing Threshold Pulse Width: 0.4 ms
Lead Channel Pacing Threshold Pulse Width: 0.4 ms
Lead Channel Sensing Intrinsic Amplitude: 24.25 mV
Lead Channel Sensing Intrinsic Amplitude: 24.25 mV
Lead Channel Sensing Intrinsic Amplitude: 4.25 mV
Lead Channel Sensing Intrinsic Amplitude: 4.25 mV
Lead Channel Setting Pacing Amplitude: 1.5 V
Lead Channel Setting Pacing Amplitude: 2.5 V
Lead Channel Setting Pacing Pulse Width: 0.4 ms
Lead Channel Setting Sensing Sensitivity: 1.2 mV

## 2018-08-01 ENCOUNTER — Encounter: Payer: Self-pay | Admitting: Cardiology

## 2018-08-01 NOTE — Progress Notes (Signed)
Remote pacemaker transmission.   

## 2018-09-03 ENCOUNTER — Emergency Department (HOSPITAL_COMMUNITY): Payer: Medicare Other

## 2018-09-03 ENCOUNTER — Encounter (HOSPITAL_COMMUNITY): Payer: Self-pay

## 2018-09-03 ENCOUNTER — Other Ambulatory Visit: Payer: Self-pay

## 2018-09-03 ENCOUNTER — Inpatient Hospital Stay (HOSPITAL_COMMUNITY)
Admission: EM | Admit: 2018-09-03 | Discharge: 2018-09-08 | DRG: 291 | Disposition: A | Discharge status: Home Health | Payer: Medicare Other | Attending: Internal Medicine | Admitting: Internal Medicine

## 2018-09-03 DIAGNOSIS — Z950 Presence of cardiac pacemaker: Secondary | ICD-10-CM

## 2018-09-03 DIAGNOSIS — I083 Combined rheumatic disorders of mitral, aortic and tricuspid valves: Secondary | ICD-10-CM | POA: Diagnosis present

## 2018-09-03 DIAGNOSIS — I428 Other cardiomyopathies: Secondary | ICD-10-CM | POA: Diagnosis present

## 2018-09-03 DIAGNOSIS — N183 Chronic kidney disease, stage 3 unspecified: Secondary | ICD-10-CM | POA: Diagnosis present

## 2018-09-03 DIAGNOSIS — G4730 Sleep apnea, unspecified: Secondary | ICD-10-CM | POA: Diagnosis present

## 2018-09-03 DIAGNOSIS — Z885 Allergy status to narcotic agent status: Secondary | ICD-10-CM

## 2018-09-03 DIAGNOSIS — I509 Heart failure, unspecified: Secondary | ICD-10-CM

## 2018-09-03 DIAGNOSIS — K760 Fatty (change of) liver, not elsewhere classified: Secondary | ICD-10-CM | POA: Diagnosis present

## 2018-09-03 DIAGNOSIS — Z79891 Long term (current) use of opiate analgesic: Secondary | ICD-10-CM

## 2018-09-03 DIAGNOSIS — Z8249 Family history of ischemic heart disease and other diseases of the circulatory system: Secondary | ICD-10-CM

## 2018-09-03 DIAGNOSIS — I10 Essential (primary) hypertension: Secondary | ICD-10-CM | POA: Diagnosis present

## 2018-09-03 DIAGNOSIS — J9601 Acute respiratory failure with hypoxia: Secondary | ICD-10-CM | POA: Diagnosis not present

## 2018-09-03 DIAGNOSIS — E1122 Type 2 diabetes mellitus with diabetic chronic kidney disease: Secondary | ICD-10-CM | POA: Diagnosis present

## 2018-09-03 DIAGNOSIS — F329 Major depressive disorder, single episode, unspecified: Secondary | ICD-10-CM | POA: Diagnosis present

## 2018-09-03 DIAGNOSIS — Z86718 Personal history of other venous thrombosis and embolism: Secondary | ICD-10-CM

## 2018-09-03 DIAGNOSIS — Z9049 Acquired absence of other specified parts of digestive tract: Secondary | ICD-10-CM

## 2018-09-03 DIAGNOSIS — Z96643 Presence of artificial hip joint, bilateral: Secondary | ICD-10-CM | POA: Diagnosis present

## 2018-09-03 DIAGNOSIS — Z888 Allergy status to other drugs, medicaments and biological substances status: Secondary | ICD-10-CM

## 2018-09-03 DIAGNOSIS — Z905 Acquired absence of kidney: Secondary | ICD-10-CM

## 2018-09-03 DIAGNOSIS — K449 Diaphragmatic hernia without obstruction or gangrene: Secondary | ICD-10-CM | POA: Diagnosis present

## 2018-09-03 DIAGNOSIS — G47411 Narcolepsy with cataplexy: Secondary | ICD-10-CM | POA: Diagnosis present

## 2018-09-03 DIAGNOSIS — I5043 Acute on chronic combined systolic (congestive) and diastolic (congestive) heart failure: Secondary | ICD-10-CM | POA: Diagnosis present

## 2018-09-03 DIAGNOSIS — G629 Polyneuropathy, unspecified: Secondary | ICD-10-CM | POA: Diagnosis present

## 2018-09-03 DIAGNOSIS — Z86010 Personal history of colonic polyps: Secondary | ICD-10-CM

## 2018-09-03 DIAGNOSIS — Z7901 Long term (current) use of anticoagulants: Secondary | ICD-10-CM

## 2018-09-03 DIAGNOSIS — I442 Atrioventricular block, complete: Secondary | ICD-10-CM | POA: Diagnosis present

## 2018-09-03 DIAGNOSIS — I5033 Acute on chronic diastolic (congestive) heart failure: Secondary | ICD-10-CM

## 2018-09-03 DIAGNOSIS — K219 Gastro-esophageal reflux disease without esophagitis: Secondary | ICD-10-CM | POA: Diagnosis present

## 2018-09-03 DIAGNOSIS — I130 Hypertensive heart and chronic kidney disease with heart failure and stage 1 through stage 4 chronic kidney disease, or unspecified chronic kidney disease: Secondary | ICD-10-CM | POA: Diagnosis not present

## 2018-09-03 DIAGNOSIS — Z833 Family history of diabetes mellitus: Secondary | ICD-10-CM

## 2018-09-03 DIAGNOSIS — Z90710 Acquired absence of both cervix and uterus: Secondary | ICD-10-CM

## 2018-09-03 DIAGNOSIS — Z79899 Other long term (current) drug therapy: Secondary | ICD-10-CM

## 2018-09-03 DIAGNOSIS — Z20828 Contact with and (suspected) exposure to other viral communicable diseases: Secondary | ICD-10-CM | POA: Diagnosis present

## 2018-09-03 DIAGNOSIS — Z87891 Personal history of nicotine dependence: Secondary | ICD-10-CM

## 2018-09-03 DIAGNOSIS — J181 Lobar pneumonia, unspecified organism: Secondary | ICD-10-CM

## 2018-09-03 DIAGNOSIS — Z794 Long term (current) use of insulin: Secondary | ICD-10-CM

## 2018-09-03 DIAGNOSIS — Z880 Allergy status to penicillin: Secondary | ICD-10-CM

## 2018-09-03 DIAGNOSIS — E871 Hypo-osmolality and hyponatremia: Secondary | ICD-10-CM | POA: Diagnosis present

## 2018-09-03 DIAGNOSIS — E1165 Type 2 diabetes mellitus with hyperglycemia: Secondary | ICD-10-CM | POA: Diagnosis present

## 2018-09-03 LAB — CBC WITH DIFFERENTIAL/PLATELET
Abs Immature Granulocytes: 0.06 10*3/uL (ref 0.00–0.07)
Basophils Absolute: 0.1 10*3/uL (ref 0.0–0.1)
Basophils Relative: 0 %
Eosinophils Absolute: 0.1 10*3/uL (ref 0.0–0.5)
Eosinophils Relative: 1 %
HCT: 43.9 % (ref 36.0–46.0)
Hemoglobin: 14.2 g/dL (ref 12.0–15.0)
Immature Granulocytes: 0 %
Lymphocytes Relative: 5 %
Lymphs Abs: 0.9 10*3/uL (ref 0.7–4.0)
MCH: 30.3 pg (ref 26.0–34.0)
MCHC: 32.3 g/dL (ref 30.0–36.0)
MCV: 93.6 fL (ref 80.0–100.0)
Monocytes Absolute: 0.9 10*3/uL (ref 0.1–1.0)
Monocytes Relative: 5 %
Neutro Abs: 14.3 10*3/uL — ABNORMAL HIGH (ref 1.7–7.7)
Neutrophils Relative %: 89 %
Platelets: 248 10*3/uL (ref 150–400)
RBC: 4.69 MIL/uL (ref 3.87–5.11)
RDW: 14.2 % (ref 11.5–15.5)
WBC: 16.2 10*3/uL — ABNORMAL HIGH (ref 4.0–10.5)
nRBC: 0 % (ref 0.0–0.2)

## 2018-09-03 LAB — PROTIME-INR
INR: 2.6 — ABNORMAL HIGH (ref 0.8–1.2)
Prothrombin Time: 27.2 seconds — ABNORMAL HIGH (ref 11.4–15.2)

## 2018-09-03 LAB — BLOOD GAS, ARTERIAL
Acid-Base Excess: 1.7 mmol/L (ref 0.0–2.0)
Bicarbonate: 25.5 mmol/L (ref 20.0–28.0)
Drawn by: 22223
FIO2: 21
O2 Saturation: 96.2 %
Patient temperature: 37
pCO2 arterial: 44.6 mmHg (ref 32.0–48.0)
pH, Arterial: 7.387 (ref 7.350–7.450)
pO2, Arterial: 89.9 mmHg (ref 83.0–108.0)

## 2018-09-03 LAB — COMPREHENSIVE METABOLIC PANEL
ALT: 51 U/L — ABNORMAL HIGH (ref 0–44)
AST: 55 U/L — ABNORMAL HIGH (ref 15–41)
Albumin: 4 g/dL (ref 3.5–5.0)
Alkaline Phosphatase: 74 U/L (ref 38–126)
Anion gap: 10 (ref 5–15)
BUN: 29 mg/dL — ABNORMAL HIGH (ref 8–23)
CO2: 26 mmol/L (ref 22–32)
Calcium: 8.6 mg/dL — ABNORMAL LOW (ref 8.9–10.3)
Chloride: 96 mmol/L — ABNORMAL LOW (ref 98–111)
Creatinine, Ser: 1.27 mg/dL — ABNORMAL HIGH (ref 0.44–1.00)
GFR calc Af Amer: 47 mL/min — ABNORMAL LOW (ref >60)
GFR calc non Af Amer: 40 mL/min — ABNORMAL LOW (ref >60)
Glucose, Bld: 397 mg/dL — ABNORMAL HIGH (ref 70–99)
Potassium: 4.9 mmol/L (ref 3.5–5.1)
Sodium: 132 mmol/L — ABNORMAL LOW (ref 135–145)
Total Bilirubin: 0.9 mg/dL (ref 0.3–1.2)
Total Protein: 7.9 g/dL (ref 6.5–8.1)

## 2018-09-03 LAB — TROPONIN I (HIGH SENSITIVITY): Troponin I (High Sensitivity): 33 ng/L — ABNORMAL HIGH (ref <18)

## 2018-09-03 LAB — BRAIN NATRIURETIC PEPTIDE: B Natriuretic Peptide: 287 pg/mL — ABNORMAL HIGH (ref 0.0–100.0)

## 2018-09-03 MED ORDER — ASPIRIN 81 MG PO CHEW
324.0000 mg | CHEWABLE_TABLET | Freq: Once | ORAL | Status: AC
  Administered 2018-09-03: 324 mg via ORAL
  Filled 2018-09-03: qty 4

## 2018-09-03 MED ORDER — FUROSEMIDE 10 MG/ML IJ SOLN
40.0000 mg | Freq: Once | INTRAMUSCULAR | Status: AC
  Administered 2018-09-03: 40 mg via INTRAVENOUS
  Filled 2018-09-03: qty 4

## 2018-09-03 NOTE — ED Triage Notes (Signed)
Pt in by [facility] for sob.  Pt with initial sats of 81, given albuterol 5 mg neb en route.  Pt reportedly took her own clonazepam prior to ems arrival.  Pt is awake and alert, moderate resp distress.

## 2018-09-03 NOTE — ED Provider Notes (Signed)
[facility]ENCY [facility]PART[facility] Provider Note   CSN: [id number] Arrival date & time: 09/03/18  2240     History   Chief Complaint Chief Complaint  Patient presents with  . Shortness of Breath    HPI Rebecca [name] is a 78 y.o. female.     Patient with history of aortic stenosis, diastolic CHF, hypertension, DVT on Coumadin, diabetes and sleep apnea presenting from home with shortness of breath onset today.  She denies any cough or fever.  She states normally is able to sleep flat without difficulty.  EMS reported her to have a O2 sat of 81% and she was given albuterol in route.  There is been no cough or fever.  No chest pain.  Patient states compliance with her medications with no recent changes.  She denies any abdominal pain, nausea or vomiting.  No recent coronavirus exposures.  Patient dark discoloration baseline which is unchanged.  She denies any history of COPD or asthma.  The history is provided by the patient.    Past Medical History:  Diagnosis Date  . Aortic stenosis   . Cataplexy   . Diastolic heart failure (HCC)   . Essential hypertension   . GERD (gastroesophageal reflux disease)   . Hiatal hernia   . History of recurrent deep vein thrombosis (DVT)   . Mitral stenosis   . Narcolepsy   . Neuropathy   . Renal disorder   . Type 2 diabetes mellitus [facility](HCC)     Patient Active Problem List   Diagnosis Date Noted  . HCAP (healthcare-associated pneumonia) 03/29/2017  . Cellulitis 03/03/2017  . Type 2 diabetes mellitus with other specified complication (HCC) 03/03/2017  . CKD (chronic kidney disease), stage III (HCC) 03/03/2017  . Status post placement of cardiac pacemaker 10/29/2016  . Pulmonary edema 10/23/2016  . CAP (community acquired pneumonia) 02/22/2015  . Fall at home 09/21/2014  . Mild aortic stenosis 09/08/2014  . Mild to moderate  mitral stenosis 09/08/2014  . Chronic anticoagulation-Coumadin 09/08/2014  . Sepsis (HCC) 09/05/2014  . Lower leg DVT  (deep venous thromboembolism), chronic (HCC) 09/05/2014  . Essential hypertension 09/05/2014  . Depression 09/05/2014  . Poorly controlled diabetes mellitus (HCC) 09/05/2014  . Hypomagnesemia 09/05/2014  . Hyponatremia 09/05/2014  . Pyelonephritis 09/05/2014  . Chronic diastolic CHF (congestive heart failure) (HCC)   . 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 GERD (gastroesophageal reflux disease) 07/08/2011  . Gastroparesis 07/08/2011  . IDA (iron deficiency anemia) 07/08/2011  . History of colonic polyps 07/08/2011  . [facility] 07/08/2011  . NAFLD (nonalcoholic fatty liver disease) 32/20/254206/04/2011  . Obesity-BMI 37 07/08/2011    Past Surgical History:  Procedure Laterality Date  . ABDOMINAL HYSTERECTOMY    . CARPAL TUNNEL RELEASE    . CARPECTOMY  12/26/2011   Procedure: CARPECTOMY;  Surgeon: Wyn Forsterobert V Sypher Jr., MD;  Location: [facility];  Service: Orthopedics;  Laterality: Right;  PROXIMAL ROW CARPECTOMY RIGHT WRIST and neurectomy  . CHOLECYSTECTOMY    . HEMORROIDECTOMY    . KIDNEY SURGERY    . NEPHROSTOMY    . PACEMAKER IMPLANT N/A 10/25/2016   Procedure: Pacemaker Implant;  Surgeon: Regan Lemmingamnitz, Will Martin, MD;  [facility];  Service: Cardiovascular;  Laterality: N/A;  . TOTAL HIP ARTHROPLASTY     bilateral     OB History   No obstetric history on file.      Home Medications    Prior to Admission medications  Medication Sig Start Date End Date Taking? Authorizing Provider  BYDUREON 2 MG PEN Inject 2 mg into the skin once a week. 01/03/16   [provider]  carvedilol (COREG) 3.125 MG tablet TAKE 1 TABLET BY MOUTH TWICE DAILY WITH A MEAL 02/13/18   Herminio Commons, MD  celecoxib (CELEBREX) 200 MG capsule Take 200 mg by mouth daily. For arthritis    [provider]  Cholecalciferol (VITAMIN D3) 3000 UNITS TABS Take 1 capsule by mouth daily.    [provider]  clomiPRAMINE  (ANAFRANIL) 25 MG capsule Take 25 mg by mouth 4 (four) times daily.     [provider]  clonazePAM (KLONOPIN) 1 MG tablet Take 1 mg by mouth at bedtime.     [provider]  Coenzyme Q10 (CO Q10) 200 MG CAPS Take 2 capsules by mouth daily.     [provider]  DULoxetine (CYMBALTA) 60 MG capsule Take 60 mg by mouth daily.      [provider]  ferrous sulfate 325 (65 FE) MG tablet Take 325 mg by mouth daily with breakfast.    [provider]  furosemide (LASIX) 40 MG tablet Take 20 mg by mouth 2 (two) times daily.  12/01/14   [provider]  gabapentin (NEURONTIN) 300 MG capsule Take 300 mg by mouth 2 (two) times daily.    [provider]  Hyaluronic Acid-Vitamin C (HYALURONIC ACID PO) Take 100 mg by mouth daily.    [provider]  insulin glargine (LANTUS) 100 unit/mL SOPN Inject 20 Units into the skin 2 (two) times daily.     [provider]  magnesium oxide (MAG-OX) 400 MG tablet Take 400 mg by mouth daily.    [provider]  metFORMIN (GLUCOPHAGE) 500 MG tablet Take 500 mg by mouth 2 (two) times daily with a meal.      [provider]  mirtazapine (REMERON) 15 MG tablet Take 15 mg by mouth at bedtime.      [provider]  Multiple Vitamin (MULTIVITAMIN WITH MINERALS) TABS tablet Take 1 tablet by mouth daily.    [provider]  Oxycodone HCl 10 MG TABS Take 10 mg by mouth. Every 4 to 6 hours    [provider]  potassium chloride (K-DUR) 10 MEQ tablet Take 1 tablet (10 mEq total) by mouth daily. 09/21/14   Imogene Burn, PA-C  QUEtiapine (SEROQUEL) 100 MG tablet Take 100 mg by mouth at bedtime.    [provider]  vitamin C (ASCORBIC ACID) 500 MG tablet Take 500 mg by mouth daily.    [provider]  warfarin (COUMADIN) 5 MG tablet Take 2.5-5 mg by mouth every evening. Starting 10/29/2016 take 2.5 mg alternating with 5 mg daily     [provider]    Family History Family History  Problem Relation Age of Onset  . Heart disease Father   . Diabetes Other   . Stroke Mother   . Scleroderma Brother     Social History Social History   Tobacco Use  . Smoking status: Former Smoker    Quit date: 10/13/1988    Years since quitting: 29.9  . Smokeless tobacco: Never Used  Substance Use Topics  . Alcohol use: No    Alcohol/week: 0.0 standard drinks  . Drug use: No     Allergies   Codeine, Compazine, Other, Penicillins, Morphine and related, and Demerol [meperidine]   Review of Systems Review of Systems  Constitutional: Negative for activity change and fever.  HENT: Negative for congestion and rhinorrhea.   Eyes: Negative for visual disturbance.  Respiratory: Positive for shortness of breath. Negative for chest tightness.   Cardiovascular: Negative for chest pain and leg swelling.  Gastrointestinal: Negative for abdominal pain, nausea and vomiting.  Genitourinary: Negative for dysuria and hematuria.  Skin: Negative for rash.  Neurological: Negative for dizziness, weakness and headaches.    all other systems are negative except as noted in the HPI and PMH.    Physical Exam Updated Vital Signs BP (!) 146/92 (BP Location: Left Arm)   Pulse 92   Temp 97.8 F (36.6 C) (Oral)   Resp (!) 36   Ht 5\' 8"  (1.727 m)   Wt 131.5 kg   SpO2 94%   BMI 44.09 kg/m   Physical Exam Vitals signs and nursing note reviewed.  Constitutional:      General: She is in acute distress.     Appearance: She is well-developed. She is obese.     Comments: Moderate respiratory distress, speaking short phrases  HENT:     Head: Normocephalic and atraumatic.     Mouth/Throat:     Pharynx: No oropharyngeal exudate.  Eyes:     Conjunctiva/sclera: Conjunctivae normal.     Pupils: Pupils are equal, round, and reactive to light.  Neck:     Musculoskeletal: Normal range of motion and neck supple.     Comments: No meningismus.  Cardiovascular:     Rate and Rhythm: Normal rate and regular rhythm.     Heart sounds: Normal heart sounds. No murmur.  Pulmonary:     Effort: Respiratory distress present.     Breath sounds: Rales present.     Comments: Diminished breath sounds bibasilar crackles, exam limited by body habitus Abdominal:     Palpations: Abdomen is soft.     Tenderness: There is no abdominal tenderness. There is no guarding or rebound.  Musculoskeletal: Normal range of motion.        General: No tenderness.     Comments: Feet are cool and purplish in color.  There is venous stasis changes of the left leg.  Pulses are intact with Doppler.  Skin:    General: Skin is warm.     Capillary Refill: Capillary refill takes less than 2 seconds.     Findings: Erythema present.  Neurological:     General: No focal deficit present.     Mental Status: She is alert and oriented to person, place, and time. Mental status is at baseline.     Cranial Nerves: No cranial nerve deficit.     Motor: No abnormal muscle tone.     Coordination: Coordination normal.     Comments: No ataxia on finger to nose bilaterally. No pronator drift. 5/5 strength throughout. CN 2-12 intact.Equal grip strength. Sensation intact.   Psychiatric:        Behavior: Behavior normal.      ED Treatments / Results  Labs (all labs ordered are listed, but only abnormal results are displayed) Labs Reviewed  CBC WITH DIFFERENTIAL/PLATELET - Abnormal; Notable for the following components:      Result Value   WBC 16.2 (*)    Neutro Abs 14.3 (*)    All other components within normal limits  COMPREHENSIVE METABOLIC PANEL - Abnormal; Notable for the following components:   Sodium 132 (*)    Chloride 96 (*)    Glucose, Bld 397 (*)    BUN 29 (*)  Creatinine, Ser 1.27 (*)    Calcium 8.6 (*)    AST 55 (*)    ALT 51 (*)    GFR calc non Af Amer 40 (*)    GFR calc Af Amer 47 (*)    All other components within normal limits  BRAIN NATRIURETIC  PEPTIDE - Abnormal; Notable for the following components:   B Natriuretic Peptide 287.0 (*)    All other components within normal limits  PROTIME-INR - Abnormal; Notable for the following components:   Prothrombin Time 27.2 (*)    INR 2.6 (*)    All other components within normal limits  TROPONIN I (HIGH SENSITIVITY) - Abnormal; Notable for the following components:   Troponin I (High Sensitivity) 33 (*)    All other components within normal limits  SARS CORONAVIRUS 2 (HOSPITAL ORDER, PERFORMED IN [facility])  BLOOD GAS, ARTERIAL  TROPONIN I (HIGH SENSITIVITY)    EKG EKG Interpretation  Date/Time:  Thursday September 03 2018 23:22:19 EDT Ventricular Rate:  91 PR Interval:    QRS Duration: 144 QT Interval:  415 QTC Calculation: 511 R Axis:   -51 Text Interpretation:  Atrial-sensed ventricular-paced rhythm No further analysis attempted due to paced rhythm No significant change was found Confirmed by Glynn Octave [phone]) on 09/03/2018 11:33:54 PM   Radiology Dg Chest Port 1 View  Result Date: 09/03/2018 CLINICAL DATA:  Shortness of breath EXAM: PORTABLE CHEST 1 VIEW COMPARISON:  March 29, 2017 FINDINGS: Again noted is cardiomegaly. There is hazy airspace opacities seen throughout both lungs, right worse than left. No focal large airspace consolidation. No pleural effusion. IMPRESSION: 1. Hazy airspace opacities within both lungs, right greater than left. This could be due to pulmonary edema versus atypical infection, such as viral pneumonia. 2. Cardiomegaly Electronically Signed   By: Jonna Clark M.D.   On: 09/03/2018 23:46    Procedures Procedures (including critical care time)  Medications Ordered in ED Medications - No data to display   Initial Impression / Assessment and Plan / ED Course  I have reviewed the triage vital signs and the nursing notes.  Pertinent labs & imaging results that were available during my care of the patient were reviewed by me and  considered in my medical decision making (see chart for details).       Acute onset of shortness of breath with tachypnea and hypoxia.  History of diastolic CHF.  No asthma or COPD. Rhonchi, hypoxia and tachypnea on exam. ABG is normal without significant CO2 retention.  Leukocytosis noted.  Coronavirus swab pending.  Chest x-ray shows hazy airspace disease bilaterally.  Patient tachypneic with increased work of breathing and hypoxia.  She is given IV Lasix.  EKG is unchanged paced rhythm.  She will be admitted for diuresis and rule out coronavirus.  We will start BiPAP assuming coronavirus total of is negative. INR is therapeutic.  Doubt pulmonary embolism.  Coronavirus testing is negative.  Will hold antibiotics at this time as patient has no fever or cough.  She feels improved on BiPAP.  No significant CO2 retention.  Admission for hypoxic respiratory failure discussed with Dr. Carren Rang  CRITICAL CARE Performed by: Glynn Octave Total critical care time: 45 minutes Critical care time was exclusive of separately billable procedures and treating other patients. Critical care was necessary to treat or prevent imminent or life-threatening deterioration. Critical care was time spent personally by me on the following activities: development of treatment plan with patient and/or surrogate as well  as nursing, discussions with consultants, evaluation of patient's response to treatment, examination of patient, obtaining history from patient or surrogate, ordering and performing treatments and interventions, ordering and review of laboratory studies, ordering and review of radiographic studies, pulse oximetry and re-evaluation of patient's condition.  Nancy FetterRena S Guadron was evaluated in Emergency Department on 09/04/2018 for the symptoms described in the history of present illness. She was evaluated in the context of the global COVID-19 pandemic, which necessitated consideration that the patient  might be at risk for infection with the SARS-CoV-2 virus that causes COVID-19. Institutional protocols and algorithms that pertain to the evaluation of patients at risk for COVID-19 are in a state of rapid change based on information released by regulatory bodies including the CDC and federal and state organizations. These policies and algorithms were followed during the patient's care in the ED.  Final Clinical Impressions(s) / ED Diagnoses   Final diagnoses:  Acute respiratory failure with hypoxia [facility](HCC)    ED Discharge Orders    None       Glynn Octaveancour, Allyn Bertoni, MD 09/04/18 [phone]

## 2018-09-04 ENCOUNTER — Inpatient Hospital Stay (HOSPITAL_COMMUNITY): Payer: Medicare Other

## 2018-09-04 DIAGNOSIS — K219 Gastro-esophageal reflux disease without esophagitis: Secondary | ICD-10-CM | POA: Diagnosis present

## 2018-09-04 DIAGNOSIS — Z90710 Acquired absence of both cervix and uterus: Secondary | ICD-10-CM | POA: Diagnosis not present

## 2018-09-04 DIAGNOSIS — J9601 Acute respiratory failure with hypoxia: Secondary | ICD-10-CM | POA: Diagnosis present

## 2018-09-04 DIAGNOSIS — E1165 Type 2 diabetes mellitus with hyperglycemia: Secondary | ICD-10-CM | POA: Diagnosis present

## 2018-09-04 DIAGNOSIS — G629 Polyneuropathy, unspecified: Secondary | ICD-10-CM | POA: Diagnosis present

## 2018-09-04 DIAGNOSIS — E1122 Type 2 diabetes mellitus with diabetic chronic kidney disease: Secondary | ICD-10-CM | POA: Diagnosis present

## 2018-09-04 DIAGNOSIS — K760 Fatty (change of) liver, not elsewhere classified: Secondary | ICD-10-CM | POA: Diagnosis present

## 2018-09-04 DIAGNOSIS — I350 Nonrheumatic aortic (valve) stenosis: Secondary | ICD-10-CM | POA: Diagnosis not present

## 2018-09-04 DIAGNOSIS — F329 Major depressive disorder, single episode, unspecified: Secondary | ICD-10-CM | POA: Diagnosis present

## 2018-09-04 DIAGNOSIS — I5031 Acute diastolic (congestive) heart failure: Secondary | ICD-10-CM

## 2018-09-04 DIAGNOSIS — I429 Cardiomyopathy, unspecified: Secondary | ICD-10-CM | POA: Diagnosis not present

## 2018-09-04 DIAGNOSIS — E871 Hypo-osmolality and hyponatremia: Secondary | ICD-10-CM | POA: Diagnosis present

## 2018-09-04 DIAGNOSIS — Z7901 Long term (current) use of anticoagulants: Secondary | ICD-10-CM | POA: Diagnosis not present

## 2018-09-04 DIAGNOSIS — I10 Essential (primary) hypertension: Secondary | ICD-10-CM | POA: Diagnosis not present

## 2018-09-04 DIAGNOSIS — I130 Hypertensive heart and chronic kidney disease with heart failure and stage 1 through stage 4 chronic kidney disease, or unspecified chronic kidney disease: Secondary | ICD-10-CM | POA: Diagnosis present

## 2018-09-04 DIAGNOSIS — J189 Pneumonia, unspecified organism: Secondary | ICD-10-CM

## 2018-09-04 DIAGNOSIS — K449 Diaphragmatic hernia without obstruction or gangrene: Secondary | ICD-10-CM | POA: Diagnosis present

## 2018-09-04 DIAGNOSIS — Z86718 Personal history of other venous thrombosis and embolism: Secondary | ICD-10-CM | POA: Diagnosis not present

## 2018-09-04 DIAGNOSIS — G4730 Sleep apnea, unspecified: Secondary | ICD-10-CM | POA: Diagnosis present

## 2018-09-04 DIAGNOSIS — Z950 Presence of cardiac pacemaker: Secondary | ICD-10-CM | POA: Diagnosis not present

## 2018-09-04 DIAGNOSIS — I442 Atrioventricular block, complete: Secondary | ICD-10-CM | POA: Diagnosis present

## 2018-09-04 DIAGNOSIS — I050 Rheumatic mitral stenosis: Secondary | ICD-10-CM | POA: Diagnosis not present

## 2018-09-04 DIAGNOSIS — I083 Combined rheumatic disorders of mitral, aortic and tricuspid valves: Secondary | ICD-10-CM | POA: Diagnosis present

## 2018-09-04 DIAGNOSIS — G47411 Narcolepsy with cataplexy: Secondary | ICD-10-CM | POA: Diagnosis present

## 2018-09-04 DIAGNOSIS — Z86010 Personal history of colonic polyps: Secondary | ICD-10-CM | POA: Diagnosis not present

## 2018-09-04 DIAGNOSIS — J181 Lobar pneumonia, unspecified organism: Secondary | ICD-10-CM | POA: Diagnosis present

## 2018-09-04 DIAGNOSIS — Z9049 Acquired absence of other specified parts of digestive tract: Secondary | ICD-10-CM | POA: Diagnosis not present

## 2018-09-04 DIAGNOSIS — I509 Heart failure, unspecified: Secondary | ICD-10-CM

## 2018-09-04 DIAGNOSIS — I5033 Acute on chronic diastolic (congestive) heart failure: Secondary | ICD-10-CM | POA: Diagnosis not present

## 2018-09-04 DIAGNOSIS — I5043 Acute on chronic combined systolic (congestive) and diastolic (congestive) heart failure: Secondary | ICD-10-CM | POA: Diagnosis present

## 2018-09-04 DIAGNOSIS — N183 Chronic kidney disease, stage 3 (moderate): Secondary | ICD-10-CM | POA: Diagnosis present

## 2018-09-04 DIAGNOSIS — I428 Other cardiomyopathies: Secondary | ICD-10-CM | POA: Diagnosis present

## 2018-09-04 DIAGNOSIS — Z20828 Contact with and (suspected) exposure to other viral communicable diseases: Secondary | ICD-10-CM | POA: Diagnosis present

## 2018-09-04 LAB — HEMOGLOBIN A1C
Hgb A1c MFr Bld: 9.2 % — ABNORMAL HIGH (ref 4.8–5.6)
Mean Plasma Glucose: 217.34 mg/dL

## 2018-09-04 LAB — MAGNESIUM: Magnesium: 2.1 mg/dL (ref 1.7–2.4)

## 2018-09-04 LAB — TSH: TSH: 1.052 u[IU]/mL (ref 0.350–4.500)

## 2018-09-04 LAB — TROPONIN I (HIGH SENSITIVITY): Troponin I (High Sensitivity): 85 ng/L — ABNORMAL HIGH (ref <18)

## 2018-09-04 LAB — GLUCOSE, CAPILLARY
Glucose-Capillary: 185 mg/dL — ABNORMAL HIGH (ref 70–99)
Glucose-Capillary: 125 mg/dL — ABNORMAL HIGH (ref 70–99)
Glucose-Capillary: 109 mg/dL — ABNORMAL HIGH (ref 70–99)

## 2018-09-04 LAB — SARS CORONAVIRUS 2 BY RT PCR (HOSPITAL ORDER, PERFORMED IN ~~LOC~~ HOSPITAL LAB): SARS Coronavirus 2: NEGATIVE

## 2018-09-04 LAB — ECHOCARDIOGRAM COMPLETE
Height: 68 in
Weight: 4640 oz

## 2018-09-04 LAB — PROCALCITONIN: Procalcitonin: 0.46 ng/mL

## 2018-09-04 MED ORDER — WARFARIN SODIUM 2.5 MG PO TABS: 2.5000 mg | ORAL_TABLET | Freq: Every evening | ORAL | Status: DC

## 2018-09-04 MED ORDER — CARVEDILOL 3.125 MG PO TABS
3.1250 mg | ORAL_TABLET | Freq: Two times a day (BID) | ORAL | Status: DC
  Administered 2018-09-04 – 2018-09-08 (×9): 3.125 mg via ORAL
  Filled 2018-09-04 (×16): qty 1

## 2018-09-04 MED ORDER — FUROSEMIDE 20 MG PO TABS
20.0000 mg | ORAL_TABLET | Freq: Two times a day (BID) | ORAL | Status: DC
  Administered 2018-09-04: 20 mg via ORAL
  Filled 2018-09-04 (×2): qty 1

## 2018-09-04 MED ORDER — WARFARIN - PHARMACIST DOSING INPATIENT: Freq: Every day | Status: DC

## 2018-09-04 MED ORDER — FUROSEMIDE 40 MG PO TABS
40.0000 mg | ORAL_TABLET | Freq: Two times a day (BID) | ORAL | Status: DC
  Administered 2018-09-04 – 2018-09-05 (×2): 40 mg via ORAL
  Filled 2018-09-04 (×2): qty 1

## 2018-09-04 MED ORDER — SODIUM CHLORIDE 0.9 % IV SOLN: 250.0000 mL | INTRAVENOUS | Status: DC | PRN

## 2018-09-04 MED ORDER — ONDANSETRON HCL 4 MG/2ML IJ SOLN
4.0000 mg | Freq: Four times a day (QID) | INTRAMUSCULAR | Status: DC | PRN
  Administered 2018-09-06: 12:00:00 4 mg via INTRAVENOUS
  Filled 2018-09-04: qty 2

## 2018-09-04 MED ORDER — IPRATROPIUM-ALBUTEROL 0.5-2.5 (3) MG/3ML IN SOLN: 3.0000 mL | Freq: Four times a day (QID) | RESPIRATORY_TRACT | Status: DC

## 2018-09-04 MED ORDER — IPRATROPIUM-ALBUTEROL 0.5-2.5 (3) MG/3ML IN SOLN: 3.0000 mL | RESPIRATORY_TRACT | Status: DC | PRN

## 2018-09-04 MED ORDER — INSULIN ASPART 100 UNIT/ML ~~LOC~~ SOLN
0.0000 [IU] | Freq: Three times a day (TID) | SUBCUTANEOUS | Status: DC
  Administered 2018-09-04: 4 [IU] via SUBCUTANEOUS
  Administered 2018-09-04: 3 [IU] via SUBCUTANEOUS
  Administered 2018-09-04: 20 [IU] via SUBCUTANEOUS
  Administered 2018-09-05 (×3): 4 [IU] via SUBCUTANEOUS
  Administered 2018-09-06 (×2): 11 [IU] via SUBCUTANEOUS
  Administered 2018-09-07 – 2018-09-08 (×3): 4 [IU] via SUBCUTANEOUS
  Filled 2018-09-04: qty 1

## 2018-09-04 MED ORDER — ORAL CARE MOUTH RINSE
15.0000 mL | Freq: Two times a day (BID) | OROMUCOSAL | Status: DC
  Administered 2018-09-04 – 2018-09-08 (×7): 15 mL via OROMUCOSAL

## 2018-09-04 MED ORDER — INSULIN GLARGINE 100 UNITS/ML SOLOSTAR PEN
20.0000 [IU] | PEN_INJECTOR | Freq: Two times a day (BID) | SUBCUTANEOUS | Status: DC
  Filled 2018-09-04: qty 3

## 2018-09-04 MED ORDER — WARFARIN SODIUM 2.5 MG PO TABS
2.5000 mg | ORAL_TABLET | Freq: Once | ORAL | Status: AC
  Administered 2018-09-04: 2.5 mg via ORAL
  Filled 2018-09-04 (×2): qty 1

## 2018-09-04 MED ORDER — DULOXETINE HCL 60 MG PO CPEP
60.0000 mg | ORAL_CAPSULE | Freq: Every day | ORAL | Status: DC
  Administered 2018-09-04 – 2018-09-08 (×5): 60 mg via ORAL
  Filled 2018-09-04 (×4): qty 1
  Filled 2018-09-04: qty 2
  Filled 2018-09-04: qty 1

## 2018-09-04 MED ORDER — LEVOFLOXACIN IN D5W 750 MG/150ML IV SOLN
750.0000 mg | INTRAVENOUS | Status: DC
  Administered 2018-09-05 – 2018-09-08 (×4): 750 mg via INTRAVENOUS
  Filled 2018-09-04 (×4): qty 150

## 2018-09-04 MED ORDER — POTASSIUM CHLORIDE ER 10 MEQ PO TBCR
10.0000 meq | EXTENDED_RELEASE_TABLET | Freq: Every day | ORAL | Status: DC
  Administered 2018-09-04 – 2018-09-08 (×5): 10 meq via ORAL
  Filled 2018-09-04 (×11): qty 1

## 2018-09-04 MED ORDER — OXYCODONE HCL ER 10 MG PO T12A
10.0000 mg | EXTENDED_RELEASE_TABLET | Freq: Two times a day (BID) | ORAL | Status: DC | PRN
  Administered 2018-09-04 – 2018-09-07 (×2): 10 mg via ORAL
  Filled 2018-09-04 (×3): qty 1

## 2018-09-04 MED ORDER — GABAPENTIN 300 MG PO CAPS
300.0000 mg | ORAL_CAPSULE | Freq: Two times a day (BID) | ORAL | Status: DC
  Administered 2018-09-04 – 2018-09-08 (×9): 300 mg via ORAL
  Filled 2018-09-04 (×10): qty 1

## 2018-09-04 MED ORDER — ACETAMINOPHEN 325 MG PO TABS
650.0000 mg | ORAL_TABLET | ORAL | Status: DC | PRN
  Administered 2018-09-08: 650 mg via ORAL
  Filled 2018-09-04: qty 2

## 2018-09-04 MED ORDER — SODIUM CHLORIDE 0.9% FLUSH: 3.0000 mL | INTRAVENOUS | Status: DC | PRN

## 2018-09-04 MED ORDER — SENNOSIDES-DOCUSATE SODIUM 8.6-50 MG PO TABS
2.0000 | ORAL_TABLET | Freq: Every evening | ORAL | Status: DC | PRN
  Filled 2018-09-04: qty 2

## 2018-09-04 MED ORDER — LEVOFLOXACIN IN D5W 750 MG/150ML IV SOLN
750.0000 mg | Freq: Once | INTRAVENOUS | Status: AC
  Administered 2018-09-04: 750 mg via INTRAVENOUS
  Filled 2018-09-04: qty 150

## 2018-09-04 MED ORDER — HYDRALAZINE HCL 20 MG/ML IJ SOLN: 10.0000 mg | INTRAMUSCULAR | Status: DC | PRN

## 2018-09-04 MED ORDER — SODIUM CHLORIDE 0.9% FLUSH
3.0000 mL | Freq: Two times a day (BID) | INTRAVENOUS | Status: DC
  Administered 2018-09-04 – 2018-09-08 (×9): 3 mL via INTRAVENOUS

## 2018-09-04 MED ORDER — CLONAZEPAM 0.5 MG PO TABS: 0.5000 mg | ORAL_TABLET | Freq: Two times a day (BID) | ORAL | Status: DC | PRN

## 2018-09-04 MED ORDER — FERROUS SULFATE 325 (65 FE) MG PO TABS
325.0000 mg | ORAL_TABLET | Freq: Every day | ORAL | Status: DC
  Administered 2018-09-04 – 2018-09-08 (×5): 325 mg via ORAL
  Filled 2018-09-04 (×9): qty 1

## 2018-09-04 MED ORDER — INSULIN GLARGINE 100 UNIT/ML ~~LOC~~ SOLN
20.0000 [IU] | Freq: Two times a day (BID) | SUBCUTANEOUS | Status: DC
  Administered 2018-09-04 – 2018-09-08 (×9): 20 [IU] via SUBCUTANEOUS
  Filled 2018-09-04 (×11): qty 0.2

## 2018-09-04 MED ORDER — CELECOXIB 100 MG PO CAPS
200.0000 mg | ORAL_CAPSULE | Freq: Every day | ORAL | Status: DC
  Filled 2018-09-04 (×4): qty 1

## 2018-09-04 MED ORDER — POLYETHYLENE GLYCOL 3350 17 G PO PACK
17.0000 g | PACK | Freq: Every day | ORAL | Status: DC | PRN
  Filled 2018-09-04: qty 1

## 2018-09-04 NOTE — ED Notes (Signed)
Pt removed from bipap. Placed on 4l Cut Off. Currently tolerating. CT called for transport.

## 2018-09-04 NOTE — Progress Notes (Signed)
Patient admitted this morning by Dr Clearence Ped.   78 year old with history of diastolic CHF, aortic stenosis, essential hypertension, GERD, recurrent DVT, diabetes mellitus type 2 came to the hospital with shortness of breath initially placed on BiPAP.  Chest x-ray showed concerns for hazy opacity infection versus edema.  Admitted with diagnosis of diastolic CHF.  When I saw the patient she was comfortable on 2-3 L nasal cannula in the ER but did have slight shortness of breath with long sentences.  She continues report that she was very uncomfortable on the bed.  And requesting it to be discharged but I explained her the necessity for hospital stay.  Patient was on 2 L nasal cannula in the ER.  Blood pressure was stable.  She did have diminished breath sounds bilaterally.  Normal sinus rhythm, abdomen was nontender nondistended.  1+ bilateral lower extremity pitting edema.  At this time my suspicion for fluid overload versus infectious etiology.  She does have some leukocytosis.  Will check a procalcitonin level but in the meantime continue IV Levaquin.  Bronchodilators PRN.  Lasix will be increased to 40 mg IV twice daily.  Order echocardiogram.  Monitor symptomatically, supplemental oxygen. Continue her Coumadin which will be managed by pharmacy.  Discussed with patient's RN in the ER at bedside.  Please call with further questions as necessary.  Gerlean Ren MD  Time spent 35 mins

## 2018-09-04 NOTE — Hospital Discharge Follow-Up (Signed)
TRH H&P    Patient Demographics:    Rebecca [name], is a 78 y.o. female  MRN: [id number]  DOB - [birth year]  Admit Date - 09/03/2018  Referring MD/NP/PA: Dr. Manus Gunningancour  Outpatient Primary MD for the patient is Oval Linseyondiego, Richard, MD  Patient coming from: Home  Chief complaint-difficulty breathing   HPI:    Rebecca [name]  is a 78 y.o. female, with history of aortic stenosis, diastolic heart failure, essential hypertension, GERD, hiatal hernia, history of recurrent DVT, mitral stenosis, narcolepsy, neuropathy and diabetes type 2, presents with difficulty breathing.  Patient has BiPAP on at time of exam, so it is very difficult to communicate.  She is also very drowsy, and an overall poor historian.  Patient reports that she has felt short of breath since yesterday.  She has not had any cough.  She has not had any fever.  She has not been around anybody that has been sick.  She does not specify rather the onset was gradual or sudden.  Patient came in via EMS and reportedly had an oxygen saturation of 81%.  She was tachypneic as well.  ABG showed a pH of 7.387, PCO2 44.6, PO2 of 89.9, bicarb of 25.5, on room air. Chemistry showed that she was hyponatremic hyperglycemic, had an uptrending creatinine from 1.15-1.27, had a downtrending GFR from greater than 60 last year, 246 in January, 240 today.  BNP was 287.  Chest x-ray showed hazy opacities-edema versus infectious etiology.  EKG showed paced rhythm.  Patient is being admitted for acute hypoxic respiratory failure requiring supplemental oxygen, that is likely due to diastolic CHF fluid overload, or infectious etiology in the lungs.   Review of systems:    In addition to the HPI above,  No Fever-chills, No Headache, No changes with Vision or hearing, No problems swallowing food or Liquids, No Chest pain, Cough or positive for shortness of Breath, No Abdominal pain, No Nausea  or Vomiting, bowel movements are regular, No Blood in stool or Urine, No dysuria, or urinary frequency  Rash in groin No new joints pains-aches,  No new weakness, tingling, numbness in any extremity, No recent weight gain or loss, No polyuria, polydypsia or polyphagia,  All other systems reviewed and are negative.    Past History of the following :    Past Medical History:  Diagnosis Date  . Aortic stenosis   . Cataplexy   . Diastolic heart failure (HCC)   . Essential hypertension   . GERD (gastroesophageal reflux disease)   . Hiatal hernia   . History of recurrent deep vein thrombosis (DVT)   . Mitral stenosis   . Narcolepsy   . Neuropathy   . Renal disorder   . Type 2 diabetes mellitus (HCC)       Past Surgical History:  Procedure Laterality Date  . ABDOMINAL HYSTERECTOMY    . CARPAL TUNNEL RELEASE    . CARPECTOMY  12/26/2011   Procedure: CARPECTOMY;  Surgeon: Wyn Forsterobert V Sypher Jr., MD;  Location: [facility];  Service: Orthopedics;  Laterality:  Right;  PROXIMAL ROW CARPECTOMY RIGHT WRIST and neurectomy  . CHOLECYSTECTOMY    . HEMORROIDECTOMY    . KIDNEY SURGERY    . NEPHROSTOMY    . PACEMAKER IMPLANT N/A 10/25/2016   Procedure: Pacemaker Implant;  Surgeon: Regan Lemming, MD;  Location: [facility] INVASIVE CV LAB;  Service: Cardiovascular;  Laterality: N/A;  . TOTAL HIP ARTHROPLASTY     bilateral      Social History:      Social History   Tobacco Use  . Smoking status: Former Smoker    Quit date: 10/13/1988    Years since quitting: 29.9  . Smokeless tobacco: Never Used  Substance Use Topics  . Alcohol use: No    Alcohol/week: 0.0 standard drinks       Family History :     Family History  Problem Relation Age of Onset  . Heart disease Father   . Diabetes Other   . Stroke Mother   . Scleroderma Brother       Home Medications:   Prior to Admission medications   Medication Sig Start Date End Date Taking? Authorizing Provider   BYDUREON 2 MG PEN Inject 2 mg into the skin once a week. 01/03/16   [provider]  carvedilol (COREG) 3.125 MG tablet TAKE 1 TABLET BY MOUTH TWICE DAILY WITH A MEAL 02/13/18   Laqueta Linden, MD  celecoxib (CELEBREX) 200 MG capsule Take 200 mg by mouth daily. For arthritis    [provider]  Cholecalciferol (VITAMIN D3) 3000 UNITS TABS Take 1 capsule by mouth daily.    [provider]  clomiPRAMINE (ANAFRANIL) 25 MG capsule Take 25 mg by mouth 4 (four) times daily.     [provider]  clonazePAM (KLONOPIN) 1 MG tablet Take 1 mg by mouth at bedtime.     [provider]  Coenzyme Q10 (CO Q10) 200 MG CAPS Take 2 capsules by mouth daily.     [provider]  DULoxetine (CYMBALTA) 60 MG capsule Take 60 mg by mouth daily.      [provider]  ferrous sulfate 325 (65 FE) MG tablet Take 325 mg by mouth daily with breakfast.    [provider]  furosemide (LASIX) 40 MG tablet Take 20 mg by mouth 2 (two) times daily.  12/01/14   [provider]  gabapentin (NEURONTIN) 300 MG capsule Take 300 mg by mouth 2 (two) times daily.    [provider]  Hyaluronic Acid-Vitamin C (HYALURONIC ACID PO) Take 100 mg by mouth daily.    [provider]  insulin glargine (LANTUS) 100 unit/mL SOPN Inject 20 Units into the skin 2 (two) times daily.     [provider]  magnesium oxide (MAG-OX) 400 MG tablet Take 400 mg by mouth daily.    [provider]  metFORMIN (GLUCOPHAGE) 500 MG tablet Take 500 mg by mouth 2 (two) times daily with a meal.      [provider]  mirtazapine (REMERON) 15 MG tablet Take 15 mg by mouth at bedtime.      [provider]  Multiple Vitamin (MULTIVITAMIN WITH MINERALS) TABS tablet Take 1 tablet by mouth daily.    [provider]  Oxycodone HCl 10 MG TABS Take 10 mg by mouth. Every 4 to 6 hours    [provider]  potassium chloride  (K-DUR) 10 MEQ tablet Take 1 tablet (10 mEq total) by mouth daily. 09/21/14   Dyann Kief, PA-C  QUEtiapine (SEROQUEL) 100 MG tablet Take 100 mg by mouth at bedtime.    [provider]  vitamin C (ASCORBIC ACID) 500 MG tablet Take 500 mg by mouth daily.    [provider]  warfarin (COUMADIN) 5 MG tablet Take 2.5-5 mg by mouth every evening. Starting 10/29/2016 take 2.5 mg alternating with 5 mg daily     [provider]     Allergies:     Allergies  Allergen Reactions  . Codeine Anxiety  . Compazine Anaphylaxis  . Other Anaphylaxis, Rash and Other (See Comments)    Uncoded Allergy. Allergen: INOVAR Uncoded Allergy. Allergen: ALL ADHESIVES Uncoded Allergy. Allergen: SILK SUTURE Uncoded Allergy. Allergen: merthiolate Thermasol  . Penicillins Shortness Of Breath and Rash    Has patient had a PCN reaction causing immediate rash, facial/tongue/throat swelling, SOB or lightheadedness with hypotension: Yes Has patient had a PCN reaction causing severe rash involving mucus membranes or skin necrosis: No Has patient had a PCN reaction that required hospitalization No Has patient had a PCN reaction occurring within the last 10 years: Yes If all of the above answers are "NO", then may proceed with Cephalosporin use.   Marland Kitchen Morphine And Related Other (See Comments)    Patient states that it makes her lose her state of mind.   . Demerol [Meperidine] Rash     Physical Exam:   Vitals  Blood pressure (!) 114/95, pulse 79, temperature 97.8 F (36.6 C), temperature source Oral, resp. rate (!) 24, height  (1.727 m), weight 131.5 kg, SpO2 97 %.  Physical Exam: Eyes: No icterus, extraocular muscles intact  Mouth: Oral mucosa is dry Neck: Supple, no deformities, masses, or tenderness Lungs: Normal respiratory effort, rhonchi present BL. No wheezes Heart: Regular rate and rhythm, S1 and S2 normal, systolic murmur, no rubs auscultated Abdomen: BS  normoactive,soft,nondistended,non-tender to palpation,no organomegaly Extremities: pitting edema, chronic color changes of skin, both feet warm to touch and pulses intact Neuro : drowsy, but  Skin: erythematous, moist, fungal rash in groin    Data Review:    CBC Recent Labs  Lab 09/03/18 2256  WBC 16.2*  HGB 14.2  HCT 43.9  PLT 248  MCV 93.6  MCH 30.3  MCHC 32.3  RDW 14.2  LYMPHSABS 0.9  MONOABS 0.9  EOSABS 0.1  BASOSABS 0.1   ------------------------------------------------------------------------------------------------------------------  Results for orders placed or performed during the hospital encounter of 09/03/18 (from the past 48 hour(s))  CBC with Differential/Platelet     Status: Abnormal   Collection Time: 09/03/18 10:56 PM  Result Value Ref Range   WBC 16.2 (H) 4.0 - 10.5 K/uL   RBC 4.69 3.87 - 5.11 MIL/uL   Hemoglobin 14.2 12.0 - 15.0 g/dL   HCT 16.1 09.6 - 04.5 %   MCV 93.6 80.0 - 100.0 fL   MCH 30.3 26.0 - 34.0 pg   MCHC 32.3 30.0 - 36.0 g/dL   RDW 40.9 81.1 - 91.4 %   Platelets 248 150 - 400 K/uL   nRBC 0.0 0.0 - 0.2 %   Neutrophils Relative % 89 %   Neutro Abs 14.3 (H) 1.7 - 7.7 K/uL   Lymphocytes Relative 5 %   Lymphs Abs 0.9 0.7 - 4.0 K/uL   Monocytes Relative 5 %   Monocytes Absolute 0.9 0.1 - 1.0 K/uL   Eosinophils Relative 1 %   Eosinophils Absolute 0.1 0.0 - 0.5 K/uL   Basophils Relative 0 %   Basophils Absolute 0.1 0.0 - 0.1  K/uL   Immature Granulocytes 0 %   Abs Immature Granulocytes 0.06 0.00 - 0.07 K/uL    Comment: Performed at [facility], [address], [place], [place] [address]  Comprehensive metabolic panel     Status: Abnormal   Collection Time: 09/03/18 10:56 PM  Result Value Ref Range   Sodium 132 (L) 135 - 145 mmol/L   Potassium 4.9 3.5 - 5.1 mmol/L   Chloride 96 (L) 98 - 111 mmol/L   CO2 26 22 - 32 mmol/L   Glucose, Bld 397 (H) 70 - 99 mg/dL   BUN 29 (H) 8 - 23 mg/dL   Creatinine, Ser 3.66 (H) 0.44 - 1.00 mg/dL    Calcium 8.6 (L) 8.9 - 10.3 mg/dL   Total Protein 7.9 6.5 - 8.1 g/dL   Albumin 4.0 3.5 - 5.0 g/dL   AST 55 (H) 15 - 41 U/L   ALT 51 (H) 0 - 44 U/L   Alkaline Phosphatase 74 38 - 126 U/L   Total Bilirubin 0.9 0.3 - 1.2 mg/dL   GFR calc non Af Amer 40 (L) >60 mL/min   GFR calc Af Amer 47 (L) >60 mL/min   Anion gap 10 5 - 15    Comment: Performed at [facility], [address], [place], [place] [address]  Brain natriuretic peptide     Status: Abnormal   Collection Time: 09/03/18 10:56 PM  Result Value Ref Range   B Natriuretic Peptide 287.0 (H) 0.0 - 100.0 pg/mL    Comment: Performed at [facility], [address] [place], [place] [address]  Troponin I (High Sensitivity)     Status: Abnormal   Collection Time: 09/03/18 10:56 PM  Result Value Ref Range   Troponin I (High Sensitivity) 33 (H) <18 ng/L    Comment: (NOTE) Elevated high sensitivity troponin I (hsTnI) values and significant  changes across serial measurements may suggest ACS but many other  chronic and acute conditions are known to elevate hsTnI results.  Refer to the "Links" section for chest pain algorithms and additional  guidance. Performed at [facility], [address] [place], [place] [address]   SARS Coronavirus 2 (CEPHEID- Performed in [facility] hospital lab), Hosp Order     Status: None   Collection Time: 09/03/18 10:56 PM   Specimen: Nasopharyngeal Swab  Result Value Ref Range   SARS Coronavirus 2 NEGATIVE NEGATIVE    Comment: (NOTE) If result is NEGATIVE SARS-CoV-2 target nucleic acids are NOT DETECTED. The SARS-CoV-2 RNA is generally detectable in upper and lower  respiratory specimens during the acute phase of infection. The lowest  concentration of SARS-CoV-2 viral copies this assay can detect is 250  copies / mL. A negative result does not preclude SARS-CoV-2 infection  and should not be used as the sole basis for treatment or other  patient management decisions.  A negative result may occur with   improper specimen collection / handling, submission of specimen other  than nasopharyngeal swab, presence of viral mutation(s) within the  areas targeted by this assay, and inadequate number of viral copies  (<250 copies / mL). A negative result must be combined with clinical  observations, patient history, and epidemiological information. If result is POSITIVE SARS-CoV-2 target nucleic acids are DETECTED. The SARS-CoV-2 RNA is generally detectable in upper and lower  respiratory specimens dur ing the acute phase of infection.  Positive  results are indicative of active infection with SARS-CoV-2.  Clinical  correlation with patient history and other diagnostic  information is  necessary to determine patient infection status.  Positive results do  not rule out bacterial infection or co-infection with other viruses. If result is PRESUMPTIVE POSTIVE SARS-CoV-2 nucleic acids MAY BE PRESENT.   A presumptive positive result was obtained on the submitted specimen  and confirmed on repeat testing.  While 2019 novel coronavirus  (SARS-CoV-2) nucleic acids may be present in the submitted sample  additional confirmatory testing may be necessary for epidemiological  and / or clinical management purposes  to differentiate between  SARS-CoV-2 and other Sarbecovirus currently known to infect humans.  If clinically indicated additional testing with an alternate test  methodology [phone]([id number]) is advised. The SARS-CoV-2 RNA is generally  detectable in upper and lower respiratory sp ecimens during the acute  phase of infection. The expected result is Negative. Fact Sheet for Patients:  BoilerBrush.com.cyhttps://www.fda.gov/media/136312/download Fact Sheet for Healthcare Providers: https://pope.com/https://www.fda.gov/media/136313/download This test is not yet approved or cleared by the [place] FDA and has been authorized for detection and/or diagnosis of SARS-CoV-2 by FDA under an Emergency Use Authorization (EUA).  This EUA will  remain in effect (meaning this test can be used) for the duration of the COVID-19 declaration under Section 564(b)(1) of the Act, 21 U.S.C. section 360bbb-3(b)(1), unless the authorization is terminated or revoked sooner. Performed at [facility], [address] [place], [place] [address]   Protime-INR     Status: Abnormal   Collection Time: 09/03/18 10:56 PM  Result Value Ref Range   Prothrombin Time 27.2 (H) 11.4 - 15.2 seconds   INR 2.6 (H) 0.8 - 1.2    Comment: (NOTE) INR goal varies based on device and disease states. Performed at [facility], [address] [place], [place] [address]   Blood gas, arterial (at [facility])     Status: None   Collection Time: 09/03/18 11:30 PM  Result Value Ref Range   FIO2 21.00    pH, Arterial 7.387 7.350 - 7.450   pCO2 arterial 44.6 32.0 - 48.0 mmHg   pO2, Arterial 89.9 83.0 - 108.0 mmHg   Bicarbonate 25.5 20.0 - 28.0 mmol/L   Acid-Base Excess 1.7 0.0 - 2.0 mmol/L   O2 Saturation 96.2 %   Patient temperature 37.0    Collection site RIGHT RADIAL    Drawn by [id number]    Allens test (pass/fail) PASS PASS    Comment: Performed at [facility], [address] [place], [place] [address]  Troponin I (High Sensitivity)     Status: Abnormal   Collection Time: 09/04/18  1:09 AM  Result Value Ref Range   Troponin I (High Sensitivity) 85 (H) <18 ng/L    Comment: (NOTE) Elevated high sensitivity troponin I (hsTnI) values and significant  changes across serial measurements may suggest ACS but many other  chronic and acute conditions are known to elevate hsTnI results.  Refer to the Links section for chest pain algorithms and additional  guidance. Performed at [facility], [address] [place], [place] [address]     Chemistries  Recent Labs  Lab 09/03/18 2256  NA 132*  K 4.9  CL 96*  CO2 26  GLUCOSE 397*  BUN 29*  CREATININE 1.27*  CALCIUM 8.6*  AST 55*  ALT 51*  ALKPHOS 74  BILITOT 0.9    ------------------------------------------------------------------------------------------------------------------  ------------------------------------------------------------------------------------------------------------------ GFR: Estimated Creatinine Clearance: 52.4 mL/min (A) (by C-G formula based on SCr of 1.27 mg/dL (H)). Liver Function Tests: Recent Labs  Lab 09/03/18 2256  AST 55*  ALT 51*  ALKPHOS 74  BILITOT 0.9  PROT 7.9  ALBUMIN 4.0   No results for input(s): LIPASE, AMYLASE in the last 168 hours. No results for input(s): AMMONIA in the last 168 hours. Coagulation Profile: Recent Labs  Lab 09/03/18 2256  INR 2.6*   Cardiac Enzymes: No results for input(s): CKTOTAL, CKMB, CKMBINDEX, TROPONINI in the last 168 hours. BNP (last 3 results) No results for input(s): PROBNP in the last 8760 hours. HbA1C: No results for input(s): HGBA1C in the last 72 hours. CBG: No results for input(s): GLUCAP in the last 168 hours. Lipid Profile: No results for input(s): CHOL, HDL, LDLCALC, TRIG, CHOLHDL, LDLDIRECT in the last 72 hours. Thyroid Function Tests: No results for input(s): TSH, T4TOTAL, FREET4, T3FREE, THYROIDAB in the last 72 hours. Anemia Panel: No results for input(s): VITAMINB12, FOLATE, FERRITIN, TIBC, IRON, RETICCTPCT in the last 72 hours.  --------------------------------------------------------------------------------------------------------------- Urine analysis:    Component Value Date/Time   COLORURINE YELLOW 03/02/2018 0850   APPEARANCEUR HAZY (A) 03/02/2018 0850   LABSPEC 1.023 03/02/2018 0850   PHURINE 5.0 03/02/2018 0850   GLUCOSEU 50 (A) 03/02/2018 0850   HGBUR NEGATIVE 03/02/2018 0850   BILIRUBINUR NEGATIVE 03/02/2018 0850   KETONESUR NEGATIVE 03/02/2018 0850   PROTEINUR NEGATIVE 03/02/2018 0850   UROBILINOGEN 0.2 09/05/2014 2015   NITRITE POSITIVE (A) 03/02/2018 0850   LEUKOCYTESUR NEGATIVE 03/02/2018 0850      Imaging Results:    Dg  Chest Port 1 View  Result Date: 09/03/2018 CLINICAL DATA:  Shortness of breath EXAM: PORTABLE CHEST 1 VIEW COMPARISON:  March 29, 2017 FINDINGS: Again noted is cardiomegaly. There is hazy airspace opacities seen throughout both lungs, right worse than left. No focal large airspace consolidation. No pleural effusion. IMPRESSION: 1. Hazy airspace opacities within both lungs, right greater than left. This could be due to pulmonary edema versus atypical infection, such as viral pneumonia. 2. Cardiomegaly Electronically Signed   By: Prudencio Pair M.D.   On: 09/03/2018 23:46    My personal review of EKG: Paced rhythm at a rate of 91 with a QTC of 511.  Assessment & Plan:    Active Problems:   CHF (congestive heart failure) ([place])   1. Diastolic CHF 1. Continue carvedilol, Lasix, investigate why this patient is not on an ACE/arb, or statin medication.  I suspect that she is not on an ACE due to her renal function decline.  Holding off on starting an ACE at this time.  She is also likely not on an aspirin due to the fact that she is on warfarin and it would increase her risk of bleed.  Last echo was 1 year ago, repeat echo this admission.  Heart failure order set complete.  Patient on BiPAP to help clear possible pulmonary edema. 2. Acute hypoxic respiratory failure secondary to fluid overload versus infectious etiology 1. Patient has a leukocytosis along with her dyspnea.  She does not report a fever however the chest x-ray cannot distinguish between edema versus infectious etiology.  Ordered a CT without contrast to further investigate. 3. Hyponatremia 1. Likely secondary to fluid overload.  Continue Lasix. 4. Leukocytosis 1. Could be stress response versus infectious.  CT scan ordered to assess for pneumonia.  Continue to monitor with daily CBC. 5. CKD 1. Downtrending GFR and uptrending creatinine.  Avoid nephrotoxic agents when possible.  Continue to monitor with BMP. 6. Recurrent DVT 1. This is  the cause of her chronic skin changes in her left lower extremity, and she reports this  is the cause for her left lower extremity being chronically larger than her right lower extremity.  Patient is on warfarin and therapeutic.  Continue current treatment. 7. Somnolence 1. Patient Seroquel has been held due to her increased somnolence.  Her benzodiazepine has been cut in half.  CO2 is within normal limits on ABG.  somnolence could be due to time of day.  Continue to monitor.   DVT Prophylaxis- warfarin and SCDs AM Labs Ordered, also please review Full Orders  Family Communication: Admission, patients condition and plan of care including tests being ordered have been discussed with the patient who indicates understanding and agree with the plan and Code Status.  Code Status: Full  Admission status: Inpatient: Based on patients clinical presentation and evaluation of above clinical data, I have made determination that patient meets Inpatient criteria at this time.  Time spent in minutes : 52   Lilyan GilfordAsia B Zierle-Ghosh M.D on 09/04/2018 at 2:44 AM

## 2018-09-04 NOTE — Progress Notes (Signed)
*  PRELIMINARY RESULTS* Echocardiogram 2D Echocardiogram has been performed.  Rebecca [name] 09/04/2018, 1:44 PM

## 2018-09-04 NOTE — ED Notes (Signed)
Lab in room.

## 2018-09-04 NOTE — Progress Notes (Signed)
ANTICOAGULATION CONSULT NOTE - Initial Consult  Pharmacy Consult for warfarin Indication: recurrent DVT  Allergies  Allergen Reactions  . Codeine Anxiety  . Compazine Anaphylaxis  . Other Anaphylaxis, Rash and Other (See Comments)    Uncoded Allergy. Allergen: INOVAR Uncoded Allergy. Allergen: ALL ADHESIVES Uncoded Allergy. Allergen: SILK SUTURE Uncoded Allergy. Allergen: merthiolate Thermasol  . Penicillins Shortness Of Breath and Rash    Has patient had a PCN reaction causing immediate rash, facial/tongue/throat swelling, SOB or lightheadedness with hypotension: Yes Has patient had a PCN reaction causing severe rash involving mucus membranes or skin necrosis: No Has patient had a PCN reaction that required hospitalization No Has patient had a PCN reaction occurring within the last 10 years: Yes If all of the above answers are "NO", then may proceed with Cephalosporin use.   Marland Kitchen Morphine And Related Other (See Comments)    Patient states that it makes her lose her state of mind.   . Demerol [Meperidine] Rash    Patient Measurements: Height: 5\' 8"  (172.7 cm) Weight: 290 lb (131.5 kg) IBW/kg (Calculated) : 63.9   Vital Signs: Temp: 97.8 F (36.6 C) (07/30 2254) Temp Source: Oral (07/30 2254) BP: 140/81 (07/31 1000) Pulse Rate: 74 (07/31 1000)  Labs: Recent Labs    09/03/18 2256 09/04/18 0109  HGB 14.2  --   HCT 43.9  --   PLT 248  --   LABPROT 27.2*  --   INR 2.6*  --   CREATININE 1.27*  --   TROPONINIHS 33* 85*    Estimated Creatinine Clearance: 52.4 mL/min (A) (by C-G formula based on SCr of 1.27 mg/dL (H)).   Medical History: Past Medical History:  Diagnosis Date  . Aortic stenosis   . Cataplexy   . Diastolic heart failure ([place])   . Essential hypertension   . GERD (gastroesophageal reflux disease)   . Hiatal hernia   . History of recurrent deep vein thrombosis (DVT)   . Mitral stenosis   . Narcolepsy   . Neuropathy   . Renal disorder   . Type 2  diabetes mellitus (HCC)     Medications:  (Not in a hospital admission)   Assessment: Pharmacy consulted to dose warfarin in patient with recurrent DVT's.  Patient INR on admission is therapeutic at 2.6.  Patient's warfarin dose alternates 2.5 and 5 mg every other day.  Goal of Therapy:  INR 2-3 Monitor platelets by anticoagulation protocol: Yes   Plan:  Warfarin 2.5 mg x 1 dose. Monitor labs, INR, and s/s of bleeding.  Revonda Standard Hadas Jessop 09/04/2018,10:29 AM

## 2018-09-04 NOTE — TOC Initial Note (Addendum)
Transition of Care [facility]) - Initial/Assessment Note    Patient Details  Name: Rebecca [name] MRN: [id number] Date of Birth: 02/28/1940  Transition [facility]) CM/SW Contact:    Yaelis Scharfenberg, Chauncey Reading, RN Phone Number: 09/04/2018, 2:03 PM  Clinical Narrative:    CHF vs PNA.              Patient from home, lives with son. Uses RW. Acutely on oxygen. May need PT eval prior to DC. TOC to follow.   Expected Discharge Plan: Home/Self Care Barriers to Discharge: No Barriers Identified   Patient Goals and CMS Choice        Expected Discharge Plan and Services Expected Discharge Plan: Home/Self Care       Living arrangements for the past 2 months: Single Family Home                                      Prior Living Arrangements/Services Living arrangements for the past 2 months: Single Family Home Lives with:: Adult Children Patient language and need for interpreter reviewed:: No Do you feel safe going back to the place where you live?: Yes      Need for Family Participation in Patient Care: Yes (Comment) Care giver support system in place?: Yes (comment) Current home services: DME(RW) Criminal Activity/Legal Involvement Pertinent to Current Situation/Hospitalization: No - Comment as needed  Activities of Daily Living Home Assistive Devices/Equipment: Gilford Rile (specify type) ADL Screening (condition at time of admission) Patient's cognitive ability adequate to safely complete daily activities?: Yes Is the patient deaf or have difficulty hearing?: No Does the patient have difficulty seeing, even when wearing glasses/contacts?: No Does the patient have difficulty concentrating, remembering, or making decisions?: No Patient able to express need for assistance with ADLs?: Yes Does the patient have difficulty dressing or bathing?: No Independently performs ADLs?: Yes (appropriate for developmental age) Does the patient have difficulty walking or climbing stairs?:  Yes Weakness of Legs: Both Weakness of Arms/Hands: Both           Admission diagnosis:  Acute respiratory failure with hypoxia ([place]) [J96.01] CHF (congestive heart failure) ([place]) [I50.9] Patient Active Problem List   Diagnosis Date Noted  . CHF (congestive heart failure) ([place]) 09/04/2018  . HCAP (healthcare-associated pneumonia) 03/29/2017  . Cellulitis 03/03/2017  . Type 2 diabetes mellitus with other specified complication ([place]) 79/89/2119  . CKD (chronic kidney disease), stage III ([place]) 03/03/2017  . Status post placement of cardiac pacemaker 10/29/2016  . Pulmonary edema 10/23/2016  . Acute respiratory failure with hypoxia ([place]) 02/22/2015  . CAP (community acquired pneumonia) 02/22/2015  . Fall at home 09/21/2014  . Mild aortic stenosis 09/08/2014  . Mild to moderate  mitral stenosis 09/08/2014  . Chronic anticoagulation-Coumadin 09/08/2014  . Sepsis ([place]) 09/05/2014  . Lower leg DVT (deep venous thromboembolism), chronic ([place]) 09/05/2014  . Essential hypertension 09/05/2014  . Depression 09/05/2014  . Poorly controlled diabetes mellitus ([place]) 09/05/2014  . Hypomagnesemia 09/05/2014  . Hyponatremia 09/05/2014  . Pyelonephritis 09/05/2014  . Chronic diastolic CHF (congestive heart failure) ([place])   . 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 GERD (gastroesophageal reflux disease) 07/08/2011  . Gastroparesis 07/08/2011  . IDA (iron deficiency anemia) 07/08/2011  . History of colonic polyps 07/08/2011  . 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 NAFLD (nonalcoholic fatty liver disease) 07/08/2011  . Obesity-BMI 37 07/08/2011   PCP:  Cindie Laroche,  Gerlene Burdock, MD Pharmacy:   [facility] [phone] - [place], [place] - [address]  [place] [address] Phone: [phone] Fax: [phone]     Social Determinants of Health (SDOH) Interventions    Readmission Risk Interventions No  flowsheet data found.

## 2018-09-04 NOTE — ED Notes (Signed)
Called resp for bi-pap

## 2018-09-05 DIAGNOSIS — E871 Hypo-osmolality and hyponatremia: Secondary | ICD-10-CM

## 2018-09-05 DIAGNOSIS — I5033 Acute on chronic diastolic (congestive) heart failure: Secondary | ICD-10-CM

## 2018-09-05 DIAGNOSIS — J181 Lobar pneumonia, unspecified organism: Secondary | ICD-10-CM

## 2018-09-05 LAB — BASIC METABOLIC PANEL
Anion gap: 12 (ref 5–15)
BUN: 30 mg/dL — ABNORMAL HIGH (ref 8–23)
CO2: 26 mmol/L (ref 22–32)
Calcium: 8.5 mg/dL — ABNORMAL LOW (ref 8.9–10.3)
Chloride: 97 mmol/L — ABNORMAL LOW (ref 98–111)
Creatinine, Ser: 1.23 mg/dL — ABNORMAL HIGH (ref 0.44–1.00)
GFR calc Af Amer: 49 mL/min — ABNORMAL LOW (ref >60)
GFR calc non Af Amer: 42 mL/min — ABNORMAL LOW (ref >60)
Glucose, Bld: 128 mg/dL — ABNORMAL HIGH (ref 70–99)
Potassium: 3.7 mmol/L (ref 3.5–5.1)
Sodium: 135 mmol/L (ref 135–145)

## 2018-09-05 LAB — CBC
HCT: 40.8 % (ref 36.0–46.0)
Hemoglobin: 12.9 g/dL (ref 12.0–15.0)
MCH: 29.9 pg (ref 26.0–34.0)
MCHC: 31.6 g/dL (ref 30.0–36.0)
MCV: 94.7 fL (ref 80.0–100.0)
Platelets: 219 10*3/uL (ref 150–400)
RBC: 4.31 MIL/uL (ref 3.87–5.11)
RDW: 14.1 % (ref 11.5–15.5)
WBC: 10.7 10*3/uL — ABNORMAL HIGH (ref 4.0–10.5)
nRBC: 0 % (ref 0.0–0.2)

## 2018-09-05 LAB — MAGNESIUM: Magnesium: 2 mg/dL (ref 1.7–2.4)

## 2018-09-05 LAB — GLUCOSE, CAPILLARY
Glucose-Capillary: 152 mg/dL — ABNORMAL HIGH (ref 70–99)
Glucose-Capillary: 162 mg/dL — ABNORMAL HIGH (ref 70–99)
Glucose-Capillary: 184 mg/dL — ABNORMAL HIGH (ref 70–99)
Glucose-Capillary: 154 mg/dL — ABNORMAL HIGH (ref 70–99)

## 2018-09-05 LAB — PROTIME-INR
INR: 2.3 — ABNORMAL HIGH (ref 0.8–1.2)
Prothrombin Time: 24.6 seconds — ABNORMAL HIGH (ref 11.4–15.2)

## 2018-09-05 LAB — PROCALCITONIN: Procalcitonin: 0.4 ng/mL

## 2018-09-05 LAB — MRSA PCR SCREENING: MRSA by PCR: NEGATIVE

## 2018-09-05 MED ORDER — CHLORHEXIDINE GLUCONATE CLOTH 2 % EX PADS
6.0000 | MEDICATED_PAD | Freq: Every day | CUTANEOUS | Status: DC
  Administered 2018-09-05 – 2018-09-06 (×2): 6 via TOPICAL

## 2018-09-05 MED ORDER — NYSTATIN 100000 UNIT/GM EX POWD
Freq: Two times a day (BID) | CUTANEOUS | Status: DC
  Administered 2018-09-05 – 2018-09-08 (×7): via TOPICAL
  Filled 2018-09-05 (×3): qty 15

## 2018-09-05 MED ORDER — FUROSEMIDE 10 MG/ML IJ SOLN
20.0000 mg | Freq: Two times a day (BID) | INTRAMUSCULAR | Status: AC
  Administered 2018-09-05 – 2018-09-06 (×3): 20 mg via INTRAVENOUS
  Filled 2018-09-05 (×3): qty 2

## 2018-09-05 MED ORDER — WARFARIN SODIUM 5 MG PO TABS
5.0000 mg | ORAL_TABLET | Freq: Once | ORAL | Status: AC
  Administered 2018-09-05: 5 mg via ORAL
  Filled 2018-09-05: qty 1

## 2018-09-05 NOTE — Progress Notes (Signed)
ANTICOAGULATION CONSULT NOTE - Initial Consult  Pharmacy Consult for warfarin Indication: recurrent DVT  Allergies  Allergen Reactions  . Codeine Anxiety  . Compazine Anaphylaxis  . Other Anaphylaxis, Rash and Other (See Comments)    Uncoded Allergy. Allergen: INOVAR Uncoded Allergy. Allergen: ALL ADHESIVES Uncoded Allergy. Allergen: SILK SUTURE Uncoded Allergy. Allergen: merthiolate Thermasol  . Penicillins Shortness Of Breath and Rash    Has patient had a PCN reaction causing immediate rash, facial/tongue/throat swelling, SOB or lightheadedness with hypotension: Yes Has patient had a PCN reaction causing severe rash involving mucus membranes or skin necrosis: No Has patient had a PCN reaction that required hospitalization No Has patient had a PCN reaction occurring within the last 10 years: Yes If all of the above answers are "NO", then may proceed with Cephalosporin use.   Marland Kitchen Morphine And Related Other (See Comments)    Patient states that it makes her lose her state of mind.   . Demerol [Meperidine] Rash    Patient Measurements: Height: 5\' 8"  (172.7 cm) Weight: 290 lb (131.5 kg) IBW/kg (Calculated) : 63.9   Vital Signs: Temp: 97.5 F (36.4 C) (08/01 0733) Temp Source: Oral (08/01 0733) BP: 140/84 (08/01 0700) Pulse Rate: 79 (08/01 0733)  Labs: Recent Labs    09/03/18 2256 09/04/18 0109 09/05/18 0416  HGB 14.2  --  12.9  HCT 43.9  --  40.8  PLT 248  --  219  LABPROT 27.2*  --  24.6*  INR 2.6*  --  2.3*  CREATININE 1.27*  --  1.23*  TROPONINIHS 33* 85*  --     Estimated Creatinine Clearance: 54.1 mL/min (A) (by C-G formula based on SCr of 1.23 mg/dL (H)).   Medical History: Past Medical History:  Diagnosis Date  . Aortic stenosis   . Cataplexy   . Diastolic heart failure ([place])   . Essential hypertension   . GERD (gastroesophageal reflux disease)   . Hiatal hernia   . History of recurrent deep vein thrombosis (DVT)   . Mitral stenosis   .  Narcolepsy   . Neuropathy   . Renal disorder   . Type 2 diabetes mellitus (H[facility])     Medications:  Medications Prior to Admission  Medication Sig Dispense Refill Last Dose  . BYDUREON 2 MG PEN Inject 2 mg into the skin once a week.  5 Past Week at Unknown time  . carvedilol (COREG) 3.125 MG tablet TAKE 1 TABLET BY MOUTH TWICE DAILY WITH A MEAL 60 tablet 6 09/03/2018 at 1800  . celecoxib (CELEBREX) 200 MG capsule Take 200 mg by mouth daily. For arthritis   09/03/2018 at Unknown time  . Cholecalciferol (VITAMIN D3) 3000 UNITS TABS Take 1 capsule by mouth daily.   09/03/2018 at Unknown time  . clomiPRAMINE (ANAFRANIL) 25 MG capsule Take 25 mg by mouth 4 (four) times daily.    09/03/2018 at Unknown time  . clonazePAM (KLONOPIN) 1 MG tablet Take 1 mg by mouth at bedtime.    09/03/2018 at Unknown time  . DULoxetine (CYMBALTA) 60 MG capsule Take 60 mg by mouth daily.     09/03/2018 at Unknown time  . ferrous sulfate 325 (65 FE) MG tablet Take 325 mg by mouth daily with breakfast.   09/03/2018 at Unknown time  . furosemide (LASIX) 40 MG tablet Take 20 mg by mouth 2 (two) times daily.   5 09/03/2018 at Unknown time  . gabapentin (NEURONTIN) 300 MG capsule Take 300 mg by mouth 2 (two)  times daily.   09/03/2018 at Unknown time  . insulin glargine (LANTUS) 100 unit/mL SOPN Inject 20 Units into the skin 2 (two) times daily.    09/03/2018 at Unknown time  . metFORMIN (GLUCOPHAGE) 500 MG tablet Take 500 mg by mouth 2 (two) times daily with a meal.   Past Week at Unknown time  . mirtazapine (REMERON) 15 MG tablet Take 15 mg by mouth at bedtime.     Past Week at Unknown time  . Multiple Vitamin (MULTIVITAMIN WITH MINERALS) TABS tablet Take 1 tablet by mouth daily.   09/03/2018 at Unknown time  . Oxycodone HCl 10 MG TABS Take 10 mg by mouth. Every 4 to 6 hours   09/03/2018 at Unknown time  . potassium chloride (K-DUR) 10 MEQ tablet Take 1 tablet (10 mEq total) by mouth daily. 90 tablet 3 09/03/2018 at Unknown time  .  QUEtiapine (SEROQUEL) 100 MG tablet Take 100 mg by mouth at bedtime.   Past Week at Unknown time  . vitamin C (ASCORBIC ACID) 500 MG tablet Take 500 mg by mouth daily.   09/03/2018 at Unknown time  . warfarin (COUMADIN) 5 MG tablet Take 2.5-5 mg by mouth every evening. Starting 10/29/2016 take 2.5 mg alternating with 5 mg daily    09/03/2018 at 1900    Assessment: Pharmacy consulted to dose warfarin in patient with recurrent DVT's.  Patient INR therapeutic at 2.3.  Patient's warfarin dose alternates 2.5 and 5 mg every other day.  Goal of Therapy:  INR 2-3 Monitor platelets by anticoagulation protocol: Yes   Plan:  Warfarin 5 mg x 1 dose. Monitor labs, INR, and s/s of bleeding.  Salvatore Decent Jeaneen Cala 09/05/2018,7:56 AM

## 2018-09-05 NOTE — Progress Notes (Signed)
PROGRESS NOTE  Jari FavreRena S Tarman ZOX:[id number] DOB: 11/20/1940 DOA: 09/03/2018 PCP: Oval Linseyondiego, Richard, MD  Brief History:  78 year old female with a history of diastolic CHF, aortic stenosis, mitral stenosis, CKD stage III, hypertension, recurrent lower extremity DVT, diabetes mellitus, complete heart block status post permanent pacemaker presenting with 1 to 2-day history of shortness of breath and coughing.  Unfortunately, the patient is a poor historian.  All of this history is obtained from review the medical record and speaking with the patient's son.  Upon presentation, the patient was noted to have some mild extremis with oxygen saturation 81%.  She was placed on BiPAP.  Initial chest x-ray showed bilateral opacities with increased interstitial markings.  WBC was 16.2 with BNP 287.  Patient was started on IV furosemide and levofloxacin for treatment of fluid overload and pneumonia.  Assessment/Plan: Acute respiratory failure with hypoxia -Secondary to fluid overload and pneumonia -Initially presented requiring BiPAP -Presently stable on 2 L nasal cannula -Wean oxygen as tolerated for saturation greater 92%  Lobar pneumonia -731 CT chest--patchy bilateral streaky opacities with mosaic attenuation.  Small bilateral pleural effusions.  Cannot rule out superimposed infection particularly in LLL -Procalcitonin peaked at 0.46 -Continue levofloxacin due to penicillin allergy  Acute on chronic diastolic CHF -09/04/2018 echo EF 40-45%, HK LV apex, severe LAE, moderate to severe MS, mild to moderate AS -Continue intravenous furosemide -Daily weights -12/15/2017 office weight 235 pounds -09/03/2018 admission weight 290 -Accurate I's and O's -Continue carvedilol -personally reviewed CXR--increase interstitial markings and bilateral opacities -  Uncontrolled diabetes mellitus type 2 with hyperglycemia -Hemoglobin A1c 9.2 -Suspect a degree of dietary indiscretion at home as the patient  CBGs are well controlled during the hospitalization -Continue Lantus 20 units twice daily -Continue NovoLog sliding scale  CKD stage III -Baseline creatinine 1.0-1.2 -Monitor clinically with diuresis  Complete heart block -Status post PPM -personally reviewed EKG--paced rhythm  Hyponatremia -Secondary to fluid overload -Improving with furosemide  Recurrent DVT -Continue warfarin  Depression -continue Cymbalta      Disposition Plan:   Home in 1-2 days  Family Communication:   Son updated on phone 09/05/18  Consultants:  none  Code Status:  FULL   DVT Prophylaxis:  warfarin   Procedures: As Listed in Progress Note Above  Antibiotics: Levofloxacin 7/30>>>       Subjective: Patient states that she is breathing better but still has nonproductive cough.  She denies any headaches, fevers, chills, hemoptysis, nausea, vomiting, diarrhea, abdominal pain, dysuria, hematuria.  Objective: Vitals:   09/05/18 0500 09/05/18 0600 09/05/18 0700 09/05/18 0733  BP: 130/84 137/88 140/84   Pulse: 67 85 80 79  Resp: (!) 22 19 20 18   Temp:    (!) 97.5 F (36.4 C)  TempSrc:    Oral  SpO2: 95% 96% 96% 96%  Weight:      Height:        Intake/Output Summary (Last 24 hours) at 09/05/2018 [id number] Last data filed at 09/04/2018 2223 Gross per 24 hour  Intake 156 ml  Output --  Net 156 ml   Weight change:  Exam:   General:  Pt is alert, follows commands appropriately, not in acute distress  HEENT: No icterus, No thrush, No neck mass, [place]/AT  Cardiovascular: RRR, S1/S2, no rubs, no gallops  Respiratory: Bibasilar crackles.  No wheezing.  Good air movement.  Abdomen: Soft/+BS, non tender, non distended, no guarding  Extremities: Trace lower extremity edema, No lymphangitis,  No petechiae, No rashes, no synovitis   Data Reviewed: I have personally reviewed following labs and imaging studies Basic Metabolic Panel: Recent Labs  Lab 09/03/18 2256 09/04/18 0109  09/05/18 0416  NA 132*  --  135  K 4.9  --  3.7  CL 96*  --  97*  CO2 26  --  26  GLUCOSE 397*  --  128*  BUN 29*  --  30*  CREATININE 1.27*  --  1.23*  CALCIUM 8.6*  --  8.5*  MG  --  2.1 2.0   Liver Function Tests: Recent Labs  Lab 09/03/18 2256  AST 55*  ALT 51*  ALKPHOS 74  BILITOT 0.9  PROT 7.9  ALBUMIN 4.0   No results for input(s): LIPASE, AMYLASE in the last 168 hours. No results for input(s): AMMONIA in the last 168 hours. Coagulation Profile: Recent Labs  Lab 09/03/18 2256 09/05/18 0416  INR 2.6* 2.3*   CBC: Recent Labs  Lab 09/03/18 2256 09/05/18 0416  WBC 16.2* 10.7*  NEUTROABS 14.3*  --   HGB 14.2 12.9  HCT 43.9 40.8  MCV 93.6 94.7  PLT 248 219   Cardiac Enzymes: No results for input(s): CKTOTAL, CKMB, CKMBINDEX, TROPONINI in the last 168 hours. BNP: Invalid input(s): POCBNP CBG: Recent Labs  Lab 09/04/18 1135 09/04/18 1608 09/04/18 2216 09/05/18 0732  GLUCAP 185* 125* 109* 152*   HbA1C: Recent Labs    09/04/18 0109  HGBA1C 9.2*   Urine analysis:    Component Value Date/Time   COLORURINE YELLOW 03/02/2018 0850   APPEARANCEUR HAZY (A) 03/02/2018 0850   LABSPEC 1.023 03/02/2018 0850   PHURINE 5.0 03/02/2018 0850   GLUCOSEU 50 (A) 03/02/2018 0850   HGBUR NEGATIVE 03/02/2018 0850   BILIRUBINUR NEGATIVE 03/02/2018 0850   KETONESUR NEGATIVE 03/02/2018 0850   PROTEINUR NEGATIVE 03/02/2018 0850   UROBILINOGEN 0.2 09/05/2014 2015   NITRITE POSITIVE (A) 03/02/2018 0850   LEUKOCYTESUR NEGATIVE 03/02/2018 0850   Sepsis Labs: @LABRCNTIP (procalcitonin:4,lacticidven:4) ) Recent Results (from the past 240 hour(s))  SARS Coronavirus 2 (CEPHEID- Performed in [place] hospital lab), Hosp Order     Status: None   Collection Time: 09/03/18 10:56 PM   Specimen: Nasopharyngeal Swab  Result Value Ref Range Status   SARS Coronavirus 2 NEGATIVE NEGATIVE Final    Comment: (NOTE) If result is NEGATIVE SARS-CoV-2 target nucleic acids are  NOT DETECTED. The SARS-CoV-2 RNA is generally detectable in upper and lower  respiratory specimens during the acute phase of infection. The lowest  concentration of SARS-CoV-2 viral copies this assay can detect is 250  copies / mL. A negative result does not preclude SARS-CoV-2 infection  and should not be used as the sole basis for treatment or other  patient management decisions.  A negative result may occur with  improper specimen collection / handling, submission of specimen other  than nasopharyngeal swab, presence of viral mutation(s) within the  areas targeted by this assay, and inadequate number of viral copies  (<250 copies / mL). A negative result must be combined with clinical  observations, patient history, and epidemiological information. If result is POSITIVE SARS-CoV-2 target nucleic acids are DETECTED. The SARS-CoV-2 RNA is generally detectable in upper and lower  respiratory specimens dur ing the acute phase of infection.  Positive  results are indicative of active infection with SARS-CoV-2.  Clinical  correlation with patient history and other diagnostic information is  necessary to determine patient infection status.  Positive results do  not  rule out bacterial infection or co-infection with other viruses. If result is PRESUMPTIVE POSTIVE SARS-CoV-2 nucleic acids MAY BE PRESENT.   A presumptive positive result was obtained on the submitted specimen  and confirmed on repeat testing.  While 2019 novel coronavirus  (SARS-CoV-2) nucleic acids may be present in the submitted sample  additional confirmatory testing may be necessary for epidemiological  and / or clinical management purposes  to differentiate between  SARS-CoV-2 and other Sarbecovirus currently known to infect humans.  If clinically indicated additional testing with an alternate test  methodology [phone]) is advised. The SARS-CoV-2 RNA is generally  detectable in upper and lower respiratory sp ecimens  during the acute  phase of infection. The expected result is Negative. Fact Sheet for Patients:  BoilerBrush.com.cy Fact Sheet for Healthcare Providers: https://pope.com/ This test is not yet approved or cleared by the [place] FDA and has been authorized for detection and/or diagnosis of SARS-CoV-2 by FDA under an Emergency Use Authorization (EUA).  This EUA will remain in effect (meaning this test can be used) for the duration of the COVID-19 declaration under Section 564(b)(1) of the Act, 21 U.S.C. section 360bbb-3(b)(1), unless the authorization is terminated or revoked sooner. Performed at [facility], [address], [place], [place] [address]   Blood culture (routine x 2)     Status: None (Preliminary result)   Collection Time: 09/04/18  6:49 AM   Specimen: BLOOD RIGHT HAND  Result Value Ref Range Status   Specimen Description BLOOD RIGHT HAND  Final   Special Requests   Final    BOTTLES DRAWN AEROBIC AND ANAEROBIC Blood Culture results may not be optimal due to an excessive volume of blood received in culture bottles   Culture   Final    NO GROWTH 1 DAY Performed at [facility], [address] [place], [place] [address]    Report Status PENDING  Incomplete  Blood culture (routine x 2)     Status: None (Preliminary result)   Collection Time: 09/04/18  6:51 AM   Specimen: BLOOD LEFT HAND  Result Value Ref Range Status   Specimen Description BLOOD LEFT HAND  Final   Special Requests   Final    BOTTLES DRAWN AEROBIC AND ANAEROBIC Blood Culture adequate volume   Culture   Final    NO GROWTH 1 DAY Performed at [facility], [address] [place], [place] [address]    Report Status PENDING  Incomplete  MRSA PCR Screening     Status: None   Collection Time: 09/04/18 10:55 AM   Specimen: Nasopharyngeal  Result Value Ref Range Status   MRSA by PCR NEGATIVE NEGATIVE Final    Comment:        The GeneXpert MRSA Assay  (FDA approved for NASAL specimens only), is one component of a comprehensive MRSA colonization surveillance program. It is not intended to diagnose MRSA infection nor to guide or monitor treatment for MRSA infections. Performed at [facility], [address], [place], [place] [address]      Scheduled Meds:  carvedilol  3.125 mg Oral BID WC   celecoxib  200 mg Oral Daily   DULoxetine  60 mg Oral Daily   ferrous sulfate  325 mg Oral Q breakfast   furosemide  40 mg Oral BID   gabapentin  300 mg Oral BID   insulin aspart  0-20 Units Subcutaneous TID WC   insulin glargine  20 Units Subcutaneous BID   mouth rinse  15 mL Mouth  Rinse BID   potassium chloride  10 mEq Oral Daily   sodium chloride flush  3 mL Intravenous Q12H   warfarin  5 mg Oral Once   Warfarin - Pharmacist Dosing Inpatient   Does not apply q1800   Continuous Infusions:  sodium chloride     levofloxacin (LEVAQUIN) IV 750 mg (09/05/18 0556)    Procedures/Studies: Ct Chest Wo Contrast  Result Date: 09/04/2018 CLINICAL DATA:  Shortness of breath EXAM: CT CHEST WITHOUT CONTRAST TECHNIQUE: Multidetector CT imaging of the chest was performed following the standard protocol without IV contrast. COMPARISON:  Chest x-ray from yesterday FINDINGS: Cardiovascular: Cardiomegaly. Extensive aortic and coronary calcification. Mitral and aortic valve calcification with bulky mitral annular calcification. No pericardial effusion. Dual-chamber pacer leads into the right atrium and right ventricle. Mediastinum/Nodes: Negative for adenopathy. Thyroid thickening, likely goiter but limited by streak artifact from battery pack and arms. Moderate sliding hiatal hernia. Lungs/Pleura: Patchy streaky opacity with mosaic attenuation. Small pleural effusions. Patient has negative COVID-19 test. Upper Abdomen: Cholecystectomy and atherosclerosis Musculoskeletal: Remote L1 superior endplate fracture. Advanced lower thoracic and upper  lumbar degenerative disease. Advanced glenohumeral degenerative disease. IMPRESSION: 1. Patchy bilateral atelectasis with areas of air trapping. There could be superimposed infection, especially in the left lower lobe. 2. Cardiomegaly without pulmonary edema. 3. Moderate hiatal hernia. Electronically Signed   By: Marnee SpringJonathon  Watts M.D.   On: 09/04/2018 06:04   Dg Chest Port 1 View  Result Date: 09/03/2018 CLINICAL DATA:  Shortness of breath EXAM: PORTABLE CHEST 1 VIEW COMPARISON:  March 29, 2017 FINDINGS: Again noted is cardiomegaly. There is hazy airspace opacities seen throughout both lungs, right worse than left. No focal large airspace consolidation. No pleural effusion. IMPRESSION: 1. Hazy airspace opacities within both lungs, right greater than left. This could be due to pulmonary edema versus atypical infection, such as viral pneumonia. 2. Cardiomegaly Electronically Signed   By: Jonna ClarkBindu  Avutu M.D.   On: 09/03/2018 23:46    Catarina Hartshornavid Virgin Zellers, DO  [facility] Pager [phone]  If 7PM-7AM, please contact night-coverage [email or web address] Password [email or web address] 09/05/2018, 8:14 AM   LOS: 1 day

## 2018-09-06 DIAGNOSIS — J181 Lobar pneumonia, unspecified organism: Secondary | ICD-10-CM

## 2018-09-06 LAB — CBC
HCT: 40.7 % (ref 36.0–46.0)
Hemoglobin: 12.9 g/dL (ref 12.0–15.0)
MCH: 29.9 pg (ref 26.0–34.0)
MCHC: 31.7 g/dL (ref 30.0–36.0)
MCV: 94.4 fL (ref 80.0–100.0)
Platelets: 220 10*3/uL (ref 150–400)
RBC: 4.31 MIL/uL (ref 3.87–5.11)
RDW: 13.8 % (ref 11.5–15.5)
WBC: 9.1 10*3/uL (ref 4.0–10.5)
nRBC: 0 % (ref 0.0–0.2)

## 2018-09-06 LAB — GLUCOSE, CAPILLARY
Glucose-Capillary: 113 mg/dL — ABNORMAL HIGH (ref 70–99)
Glucose-Capillary: 256 mg/dL — ABNORMAL HIGH (ref 70–99)
Glucose-Capillary: 260 mg/dL — ABNORMAL HIGH (ref 70–99)
Glucose-Capillary: 116 mg/dL — ABNORMAL HIGH (ref 70–99)

## 2018-09-06 LAB — BASIC METABOLIC PANEL
Anion gap: 11 (ref 5–15)
BUN: 26 mg/dL — ABNORMAL HIGH (ref 8–23)
CO2: 28 mmol/L (ref 22–32)
Calcium: 8.6 mg/dL — ABNORMAL LOW (ref 8.9–10.3)
Chloride: 97 mmol/L — ABNORMAL LOW (ref 98–111)
Creatinine, Ser: 1 mg/dL (ref 0.44–1.00)
GFR calc Af Amer: >60 mL/min (ref >60)
GFR calc non Af Amer: 54 mL/min — ABNORMAL LOW (ref >60)
Glucose, Bld: 114 mg/dL — ABNORMAL HIGH (ref 70–99)
Potassium: 3.7 mmol/L (ref 3.5–5.1)
Sodium: 136 mmol/L (ref 135–145)

## 2018-09-06 LAB — PROTIME-INR
INR: 2.2 — ABNORMAL HIGH (ref 0.8–1.2)
Prothrombin Time: 24.1 seconds — ABNORMAL HIGH (ref 11.4–15.2)

## 2018-09-06 LAB — BRAIN NATRIURETIC PEPTIDE: B Natriuretic Peptide: 555 pg/mL — ABNORMAL HIGH (ref 0.0–100.0)

## 2018-09-06 LAB — MAGNESIUM: Magnesium: 2.1 mg/dL (ref 1.7–2.4)

## 2018-09-06 LAB — PROCALCITONIN: Procalcitonin: 0.22 ng/mL

## 2018-09-06 MED ORDER — WARFARIN SODIUM 2.5 MG PO TABS
2.5000 mg | ORAL_TABLET | Freq: Once | ORAL | Status: AC
  Administered 2018-09-06: 2.5 mg via ORAL
  Filled 2018-09-06: qty 1

## 2018-09-06 MED ORDER — FUROSEMIDE 20 MG PO TABS
20.0000 mg | ORAL_TABLET | Freq: Two times a day (BID) | ORAL | Status: DC
  Administered 2018-09-07 – 2018-09-08 (×3): 20 mg via ORAL
  Filled 2018-09-06 (×3): qty 1

## 2018-09-06 NOTE — Evaluation (Signed)
Physical Therapy Evaluation Patient Details Name: Rebecca [name] MRN: [id number] DOB: Feb 01, 1941 Today's Date: 09/06/2018   History of Present Illness  78 year old female with a history of diastolic CHF, aortic stenosis, mitral stenosis, CKD stage III, hypertension, recurrent lower extremity DVT, diabetes mellitus, complete heart block status post permanent pacemaker presenting with 1 to 2-day history of shortness of breath and coughing.  Unfortunately, the patient is a poor historian.  All of this history is obtained from review the medical record and speaking with the patient's son.Patient reports bilateral hip surgeries in the past.    Clinical Impression  Pt admitted with above diagnosis. Pt currently with functional limitations due to the deficits listed below (see PT Problem List).  Patient requires assistance with all mobility at this time due to muscle weakness, fatigue and c/o pain. Patient reports her home is handicap accessible for her deceased paraplegic husband. She also reports her son, whom she lives with, works full time. Patient refusing SNF recommendation at this time, requesting to go home with [facility] services and assistance from son. Pt will benefit from skilled PT to increase their independence and safety with mobility to allow discharge to the venue listed below.      Follow Up Recommendations SNF;Home health PT;Supervision for mobility/OOB;Supervision - Intermittent    Equipment Recommendations  None recommended by PT    Recommendations for Other Services       Precautions / Restrictions Restrictions Weight Bearing Restrictions: No      Mobility  Bed Mobility Overal bed mobility: Needs Assistance Bed Mobility: Supine to Sit     Supine to sit: Mod assist;HOB elevated        Transfers Overall transfer level: Needs assistance Equipment used: Rolling walker (2 wheeled) Transfers: Sit to/from [facility] Sit to Stand: Mod assist;+2 physical  assistance(mod A x1 from elevated bed) Stand pivot transfers: Min assist          Ambulation/Gait Ambulation/Gait assistance: Min assist Gait Distance (Feet): 20 Feet(10 ft x2) Assistive device: Rolling walker (2 wheeled) Gait Pattern/deviations: Step-through pattern;Decreased step length - right;Decreased step length - left;Decreased stride length;Trunk flexed;Narrow base of support;Antalgic Gait velocity: decreased   General Gait Details: very slow, labored gait, anxious, c/o left and right thigh pains during weight bearing activities.  Stairs            Wheelchair Mobility    Modified Rankin (Stroke Patients Only)       Balance Overall balance assessment: Needs assistance Sitting-balance support: No upper extremity supported;Feet unsupported Sitting balance-Leahy Scale: Poor Sitting balance - Comments: feet supported, bilateral upper extremity support - fair Postural control: Posterior lean Standing balance support: Bilateral upper extremity supported;During functional activity Standing balance-Leahy Scale: Fair Standing balance comment: fair/poor, anxious                             Pertinent Vitals/Pain Pain Assessment: Faces Faces Pain Scale: Hurts whole lot Pain Location: left anterior thigh, right posterior thigh Pain Descriptors / Indicators: Cramping;Grimacing    Home Living Family/patient expects to be discharged to:: Private residence(patient reports house set up for deceased paraplegic husband and handicap accessible,) Living Arrangements: Children(son works full time) Available Help at Discharge: Available PRN/intermittently;Family Type of Home: House Home Access: Level entry     Home Layout: One level Home Equipment: Walker - 4 wheels(bariatric RW)      Prior Function Level of Independence: Needs assistance   Gait / Transfers Assistance  Needed: walked with bariatric RW reportedly independently  ADL's / Homemaking Assistance  Needed: family        Hand Dominance        Extremity/Trunk Assessment   Upper Extremity Assessment Upper Extremity Assessment: Generalized weakness    Lower Extremity Assessment Lower Extremity Assessment: Generalized weakness    Cervical / Trunk Assessment Cervical / Trunk Assessment: Kyphotic  Communication   Communication: No difficulties  Cognition Arousal/Alertness: Awake/alert Behavior During Therapy: WFL for tasks assessed/performed Overall Cognitive Status: No family/caregiver present to determine baseline cognitive functioning                                 General Comments: MD noted dementia at baseline      General Comments      Exercises     Assessment/Plan    PT Assessment Patient needs continued PT services  PT Problem List Decreased strength;Pain;Decreased activity tolerance;Decreased balance;Decreased knowledge of use of DME;Decreased mobility       PT Treatment Interventions DME instruction;Therapeutic exercise;Gait training;Balance training;Cognitive remediation;Functional mobility training;Therapeutic activities;Patient/family education    PT Goals (Current goals can be found in the Care Plan section)  Acute Rehab PT Goals Patient Stated Goal: Go home PT Goal Formulation: With patient Time For Goal Achievement: 09/20/18 Potential to Achieve Goals: Fair    Frequency Min 3X/week   Barriers to discharge        Co-evaluation               AM-PAC PT "6 Clicks" Mobility  Outcome Measure Help needed turning from your back to your side while in a flat bed without using bedrails?: A Lot Help needed moving from lying on your back to sitting on the side of a flat bed without using bedrails?: A Lot Help needed moving to and from a bed to a chair (including a wheelchair)?: A Lot Help needed standing up from a chair using your arms (e.g., wheelchair or bedside chair)?: A Lot Help needed to walk in hospital room?: A  Lot Help needed climbing 3-5 steps with a railing? : Total 6 Click Score: 11    End of Session Equipment Utilized During Treatment: Gait belt;Oxygen Activity Tolerance: Patient limited by pain;Patient limited by lethargy Patient left: in chair;with call bell/phone within reach Nurse Communication: Mobility status(patient requests an anema) PT Visit Diagnosis: Unsteadiness on feet (R26.81);Other abnormalities of gait and mobility (R26.89);Muscle weakness (generalized) (M62.81)    Time: 2979-8921 PT Time Calculation (min) (ACUTE ONLY): 45 min   Charges:   PT Evaluation $PT Eval Moderate Complexity: 1 Mod PT Treatments $Gait Training: 8-22 mins $Therapeutic Activity: 8-22 mins        Katina Dung. Hartnett-Rands, MS, PT [facility] [place] [facility] [place] [phone] 09/06/2018, 10:33 AM

## 2018-09-06 NOTE — Progress Notes (Signed)
ANTICOAGULATION CONSULT NOTE - Pharmacy Consult for warfarin Indication: recurrent DVT  Allergies  Allergen Reactions  . Codeine Anxiety  . Compazine Anaphylaxis  . Other Anaphylaxis, Rash and Other (See Comments)    Uncoded Allergy. Allergen: INOVAR Uncoded Allergy. Allergen: ALL ADHESIVES Uncoded Allergy. Allergen: SILK SUTURE Uncoded Allergy. Allergen: merthiolate Thermasol  . Penicillins Shortness Of Breath and Rash    Has patient had a PCN reaction causing immediate rash, facial/tongue/throat swelling, SOB or lightheadedness with hypotension: Yes Has patient had a PCN reaction causing severe rash involving mucus membranes or skin necrosis: No Has patient had a PCN reaction that required hospitalization No Has patient had a PCN reaction occurring within the last 10 years: Yes If all of the above answers are "NO", then may proceed with Cephalosporin use.   Marland Kitchen Morphine And Related Other (See Comments)    Patient states that it makes her lose her state of mind.   . Demerol [Meperidine] Rash    Patient Measurements: Height: 5\' 8"  (172.7 cm) Weight: 241 lb 2.9 oz (109.4 kg) IBW/kg (Calculated) : 63.9   Vital Signs: Temp: 98.5 F (36.9 C) (08/02 0400) Temp Source: Oral (08/02 0400) BP: 126/64 (08/02 0600) Pulse Rate: 67 (08/02 0600)  Labs: Recent Labs    09/03/18 2256 09/04/18 0109 09/05/18 0416 09/06/18 0443  HGB 14.2  --  12.9 12.9  HCT 43.9  --  40.8 40.7  PLT 248  --  219 220  LABPROT 27.2*  --  24.6* 24.1*  INR 2.6*  --  2.3* 2.2*  CREATININE 1.27*  --  1.23* 1.00  TROPONINIHS 33* 85*  --   --     Estimated Creatinine Clearance: 60.1 mL/min (by C-G formula based on SCr of 1 mg/dL).   Medical History: Past Medical History:  Diagnosis Date  . Aortic stenosis   . Cataplexy   . Diastolic heart failure ([place])   . Essential hypertension   . GERD (gastroesophageal reflux disease)   . Hiatal hernia   . History of recurrent deep vein thrombosis (DVT)   .  Mitral stenosis   . Narcolepsy   . Neuropathy   . Renal disorder   . Type 2 diabetes mellitus ([facility])     Medications:  Medications Prior to Admission  Medication Sig Dispense Refill Last Dose  . BYDUREON 2 MG PEN Inject 2 mg into the skin once a week.  5 Past Week at Unknown time  . carvedilol (COREG) 3.125 MG tablet TAKE 1 TABLET BY MOUTH TWICE DAILY WITH A MEAL 60 tablet 6 09/03/2018 at 1800  . celecoxib (CELEBREX) 200 MG capsule Take 200 mg by mouth daily. For arthritis   09/03/2018 at Unknown time  . Cholecalciferol (VITAMIN D3) 3000 UNITS TABS Take 1 capsule by mouth daily.   09/03/2018 at Unknown time  . clomiPRAMINE (ANAFRANIL) 25 MG capsule Take 25 mg by mouth 4 (four) times daily.    09/03/2018 at Unknown time  . clonazePAM (KLONOPIN) 1 MG tablet Take 1 mg by mouth at bedtime.    09/03/2018 at Unknown time  . DULoxetine (CYMBALTA) 60 MG capsule Take 60 mg by mouth daily.     09/03/2018 at Unknown time  . ferrous sulfate 325 (65 FE) MG tablet Take 325 mg by mouth daily with breakfast.   09/03/2018 at Unknown time  . furosemide (LASIX) 40 MG tablet Take 20 mg by mouth 2 (two) times daily.   5 09/03/2018 at Unknown time  . gabapentin (NEURONTIN) 300 MG  capsule Take 300 mg by mouth 2 (two) times daily.   09/03/2018 at Unknown time  . insulin glargine (LANTUS) 100 unit/mL SOPN Inject 20 Units into the skin 2 (two) times daily.    09/03/2018 at Unknown time  . metFORMIN (GLUCOPHAGE) 500 MG tablet Take 500 mg by mouth 2 (two) times daily with a meal.   Past Week at Unknown time  . mirtazapine (REMERON) 15 MG tablet Take 15 mg by mouth at bedtime.     Past Week at Unknown time  . Multiple Vitamin (MULTIVITAMIN WITH MINERALS) TABS tablet Take 1 tablet by mouth daily.   09/03/2018 at Unknown time  . Oxycodone HCl 10 MG TABS Take 10 mg by mouth. Every 4 to 6 hours   09/03/2018 at Unknown time  . potassium chloride (K-DUR) 10 MEQ tablet Take 1 tablet (10 mEq total) by mouth daily. 90 tablet 3 09/03/2018 at  Unknown time  . QUEtiapine (SEROQUEL) 100 MG tablet Take 100 mg by mouth at bedtime.   Past Week at Unknown time  . vitamin C (ASCORBIC ACID) 500 MG tablet Take 500 mg by mouth daily.   09/03/2018 at Unknown time  . warfarin (COUMADIN) 5 MG tablet Take 2.5-5 mg by mouth every evening. Starting 10/29/2016 take 2.5 mg alternating with 5 mg daily    09/03/2018 at 1900    Assessment: Pharmacy consulted to dose warfarin in patient with recurrent DVT's.  Patient INR therapeutic at 2.2.  Patient's warfarin dose alternates 2.5 and 5 mg every other day.  Goal of Therapy:  INR 2-3 Monitor platelets by anticoagulation protocol: Yes   Plan:  Warfarin 2.5 mg x 1 dose. Monitor labs, INR, and s/s of bleeding.  Tad Moore 09/06/2018,8:11 AM

## 2018-09-06 NOTE — Plan of Care (Signed)
  Problem: Acute Rehab PT Goals(only PT should resolve) Goal: Pt will Roll Supine to Side Outcome: Progressing Flowsheets (Taken 09/06/2018 1038) Pt will Roll Supine to Side: with min assist Goal: Pt Will Go Supine/Side To Sit Outcome: Progressing Flowsheets (Taken 09/06/2018 1038) Pt will go Supine/Side to Sit: with minimal assist Goal: Pt Will Go Sit To Supine/Side Outcome: Progressing Flowsheets (Taken 09/06/2018 1038) Pt will go Sit to Supine/Side: with minimal assist Goal: Patient Will Transfer Sit To/From Stand Outcome: Progressing Flowsheets (Taken 09/06/2018 1038) Patient will transfer sit to/from stand: with moderate assist Goal: Pt Will Transfer Bed To Chair/Chair To Bed Outcome: Progressing Flowsheets (Taken 09/06/2018 1038) Pt will Transfer Bed to Chair/Chair to Bed: with min assist Goal: Pt Will Ambulate Outcome: Progressing Flowsheets (Taken 09/06/2018 1038) Pt will Ambulate:  25 feet  with min guard assist  with least restrictive assistive device  Pamala Hurry D. Hartnett-Rands, MS, PT Per [place] [phone] 09/06/2018

## 2018-09-06 NOTE — Progress Notes (Signed)
PROGRESS NOTE  Rebecca FavreRena S [name] WUJ:[id number] DOB: November 24, 1940 DOA: 09/03/2018 PCP: Oval Linseyondiego, Richard, MD   Brief History:  78 year old female with a history of diastolic CHF, aortic stenosis, mitral stenosis, CKD stage III, hypertension, recurrent lower extremity DVT, diabetes mellitus, complete heart block status post permanent pacemaker presenting with 1 to 2-day history of shortness of breath and coughing.  Unfortunately, the patient is a poor historian.  All of this history is obtained from review the medical record and speaking with the patient's son.  Upon presentation, the patient was noted to have some mild extremis with oxygen saturation 81%.  She was placed on BiPAP.  Initial chest x-ray showed bilateral opacities with increased interstitial markings.  WBC was 16.2 with BNP 287.  Patient was started on IV furosemide and levofloxacin for treatment of fluid overload and pneumonia.  Assessment/Plan: Acute respiratory failure with hypoxia -Secondary to fluid overload and pneumonia -Initially presented requiring BiPAP -Presently stable on 2 L nasal cannula -Wean oxygen as tolerated for saturation greater 92%  Lobar pneumonia -731 CT chest--patchy bilateral streaky opacities with mosaic attenuation.  Small bilateral pleural effusions.  Cannot rule out superimposed infection particularly in LLL -Procalcitonin peaked at 0.46 -Continue levofloxacin due to penicillin allergy  Acute on chronic diastolic CHF -09/04/2018 echo EF 40-45%, HK LV apex, severe LAE, moderate to severe MS, mild to moderate AS -Continue intravenous furosemide -Daily weights -12/15/2017 office weight 235 pounds -09/03/2018 admission weight 290?? -Accurate I's and O's--NEG 2.5 L in last 24 hours -Continue carvedilol -personally reviewed CXR--increase interstitial markings and bilateral opacities  Uncontrolled diabetes mellitus type 2 with hyperglycemia -Hemoglobin A1c 9.2 -Suspect a degree of dietary  indiscretion at home as the patient CBGs are well controlled during the hospitalization -Continue Lantus 20 units twice daily -Continue NovoLog sliding scale  CKD stage III -Baseline creatinine 1.0-1.2 -Monitor clinically with diuresis  Complete heart block -Status post PPM -personally reviewed EKG--paced rhythm  Hyponatremia -Secondary to fluid overload -Improving with furosemide  Recurrent DVT -Continue warfarin  Depression -continue Cymbalta      Disposition Plan:   Home vs SNF in 1-2 days  Family Communication:   Son updated on phone 09/06/18  Consultants:  none  Code Status:  FULL   DVT Prophylaxis:  warfarin   Procedures: As Listed in Progress Note Above  Antibiotics: Levofloxacin 7/30>>>     Subjective: Patient denies fevers, chills, headache, chest pain, dyspnea, nausea, vomiting, diarrhea, abdominal pain, dysuria, hematuria, hematochezia, and melena.   Objective: Vitals:   09/06/18 0500 09/06/18 0600 09/06/18 0700 09/06/18 0800  BP: 132/69 126/64 94/80 91/64   Pulse: 64 67 65 68  Resp: 19 (!) 23 (!) 25 15  Temp:      TempSrc:      SpO2: 98% 96% 98% 98%  Weight:      Height:        Intake/Output Summary (Last 24 hours) at 09/06/2018 1252 Last data filed at 09/06/2018 0800 Gross per 24 hour  Intake 249.5 ml  Output 1400 ml  Net -1150.5 ml   Weight change:  Exam:   General:  Pt is alert, follows commands appropriately, not in acute distress  HEENT: No icterus, No thrush, No neck mass, [place]/AT  Cardiovascular: RRR, S1/S2, no rubs, no gallops  Respiratory: bibasilar rales. No wheeze.  Abdomen: Soft/+BS, non tender, non distended, no guarding  Extremities: trace LE edema, No lymphangitis, No petechiae, No rashes, no synovitis   Data Reviewed:  I have personally reviewed following labs and imaging studies Basic Metabolic Panel: Recent Labs  Lab 09/03/18 2256 09/04/18 0109 09/05/18 0416 09/06/18 0443  NA 132*  --   135 136  K 4.9  --  3.7 3.7  CL 96*  --  97* 97*  CO2 26  --  26 28  GLUCOSE 397*  --  128* 114*  BUN 29*  --  30* 26*  CREATININE 1.27*  --  1.23* 1.00  CALCIUM 8.6*  --  8.5* 8.6*  MG  --  2.1 2.0 2.1   Liver Function Tests: Recent Labs  Lab 09/03/18 2256  AST 55*  ALT 51*  ALKPHOS 74  BILITOT 0.9  PROT 7.9  ALBUMIN 4.0   No results for input(s): LIPASE, AMYLASE in the last 168 hours. No results for input(s): AMMONIA in the last 168 hours. Coagulation Profile: Recent Labs  Lab 09/03/18 2256 09/05/18 0416 09/06/18 0443  INR 2.6* 2.3* 2.2*   CBC: Recent Labs  Lab 09/03/18 2256 09/05/18 0416 09/06/18 0443  WBC 16.2* 10.7* 9.1  NEUTROABS 14.3*  --   --   HGB 14.2 12.9 12.9  HCT 43.9 40.8 40.7  MCV 93.6 94.7 94.4  PLT 248 219 220   Cardiac Enzymes: No results for input(s): CKTOTAL, CKMB, CKMBINDEX, TROPONINI in the last 168 hours. BNP: Invalid input(s): POCBNP CBG: Recent Labs  Lab 09/05/18 1117 09/05/18 1600 09/05/18 2052 09/06/18 0718 09/06/18 1124  GLUCAP 162* 184* 154* 113* 256*   HbA1C: Recent Labs    09/04/18 0109  HGBA1C 9.2*   Urine analysis:    Component Value Date/Time   COLORURINE YELLOW 03/02/2018 0850   APPEARANCEUR HAZY (A) 03/02/2018 0850   LABSPEC 1.023 03/02/2018 0850   PHURINE 5.0 03/02/2018 0850   GLUCOSEU 50 (A) 03/02/2018 0850   HGBUR NEGATIVE 03/02/2018 0850   BILIRUBINUR NEGATIVE 03/02/2018 0850   KETONESUR NEGATIVE 03/02/2018 0850   PROTEINUR NEGATIVE 03/02/2018 0850   UROBILINOGEN 0.2 09/05/2014 2015   NITRITE POSITIVE (A) 03/02/2018 0850   LEUKOCYTESUR NEGATIVE 03/02/2018 0850   Sepsis Labs: @LABRCNTIP (procalcitonin:4,lacticidven:4) ) Recent Results (from the past 240 hour(s))  SARS Coronavirus 2 (CEPHEID- [facility]formed [facility] Hosp Order     Status: None   Collection Time: 09/03/18 10:56 PM   Specimen: Nasopharyngeal Swab  Result Value Ref Range Status   SARS Coronavirus 2 NEGATIVE  NEGATIVE Final    Comment: (NOTE) If result is NEGATIVE SARS-CoV-2 target nucleic acids are NOT DETECTED. The SARS-CoV-2 RNA is generally detectable in upper and lower  respiratory specimens during the acute phase of infection. The lowest  concentration of SARS-CoV-2 viral copies this assay can detect is 250  copies / mL. A negative result does not preclude SARS-CoV-2 infection  and should not be used as the sole basis for treatment or other  patient management decisions.  A negative result may occur with  improper specimen collection / handling, submission of specimen other  than nasopharyngeal swab, presence of viral mutation(s) within the  areas targeted by this assay, and inadequate number of viral copies  (<250 copies / mL). A negative result must be combined with clinical  observations, patient history, and epidemiological information. If result is POSITIVE SARS-CoV-2 target nucleic acids are DETECTED. The SARS-CoV-2 RNA is generally detectable in upper and lower  respiratory specimens dur ing the acute phase of infection.  Positive  results are indicative of active infection with SARS-CoV-2.  Clinical  correlation with patient history and  other diagnostic information is  necessary to determine patient infection status.  Positive results do  not rule out bacterial infection or co-infection with other viruses. If result is PRESUMPTIVE POSTIVE SARS-CoV-2 nucleic acids MAY BE PRESENT.   A presumptive positive result was obtained on the submitted specimen  and confirmed on repeat testing.  While 2019 novel coronavirus  (SARS-CoV-2) nucleic acids may be present in the submitted sample  additional confirmatory testing may be necessary for epidemiological  and / or clinical management purposes  to differentiate between  SARS-CoV-2 and other Sarbecovirus currently known to infect humans.  If clinically indicated additional testing with an alternate test  methodology [phone]) is  advised. The SARS-CoV-2 RNA is generally  detectable in upper and lower respiratory sp ecimens during the acute  phase of infection. The expected result is Negative. Fact Sheet for Patients:  StrictlyIdeas.no Fact Sheet for Healthcare Providers: BankingDealers.co.za This test is not yet approved or cleared by the [place] FDA and has been authorized for detection and/or diagnosis of SARS-CoV-2 by FDA under an Emergency Use Authorization (EUA).  This EUA will remain in effect (meaning this test can be used) for the duration of the COVID-19 declaration under Section 564(b)(1) of the Act, 21 U.S.C. section 360bbb-3(b)(1), unless the authorization is terminated or revoked sooner. Performed at [facility], [address], [place], [place] [address]   Blood culture (routine x 2)     Status: None (Preliminary result)   Collection Time: 09/04/18  6:49 AM   Specimen: BLOOD RIGHT HAND  Result Value Ref Range Status   Specimen Description BLOOD RIGHT HAND  Final   Special Requests   Final    BOTTLES DRAWN AEROBIC AND ANAEROBIC Blood Culture results may not be optimal due to an excessive volume of blood received in culture bottles   Culture   Final    NO GROWTH 2 DAYS Performed at [facility], [address] [place], [place] [address]    Report Status PENDING  Incomplete  Blood culture (routine x 2)     Status: None (Preliminary result)   Collection Time: 09/04/18  6:51 AM   Specimen: BLOOD LEFT HAND  Result Value Ref Range Status   Specimen Description BLOOD LEFT HAND  Final   Special Requests   Final    BOTTLES DRAWN AEROBIC AND ANAEROBIC Blood Culture adequate volume   Culture   Final    NO GROWTH 2 DAYS Performed at [facility], [address], [place], [place] [address]    Report Status PENDING  Incomplete  MRSA PCR Screening     Status: None   Collection Time: 09/04/18 10:55 AM   Specimen: Nasopharyngeal  Result Value Ref Range  Status   MRSA by PCR NEGATIVE NEGATIVE Final    Comment:        The GeneXpert MRSA Assay (FDA approved for NASAL specimens only), is one component of a comprehensive MRSA colonization surveillance program. It is not intended to diagnose MRSA infection nor to guide or monitor treatment for MRSA infections. Performed at [facility], [address], [place],  [address]      Scheduled Meds:  carvedilol  3.125 mg Oral BID WC   Chlorhexidine Gluconate Cloth  6 each Topical Q0600   DULoxetine  60 mg Oral Daily   ferrous sulfate  325 mg Oral Q breakfast   furosemide  20 mg Intravenous BID   [START ON 09/07/2018] furosemide  20 mg Oral BID   gabapentin  300  mg Oral BID   insulin aspart  0-20 Units Subcutaneous TID WC   insulin glargine  20 Units Subcutaneous BID   mouth rinse  15 mL Mouth Rinse BID   nystatin   Topical BID   potassium chloride  10 mEq Oral Daily   sodium chloride flush  3 mL Intravenous Q12H   warfarin  2.5 mg Oral Once   Warfarin - Pharmacist Dosing Inpatient   Does not apply q1800   Continuous Infusions:  sodium chloride     levofloxacin (LEVAQUIN) IV 750 mg (09/06/18 0451)    Procedures/Studies: Ct Chest Wo Contrast  Result Date: 09/04/2018 CLINICAL DATA:  Shortness of breath EXAM: CT CHEST WITHOUT CONTRAST TECHNIQUE: Multidetector CT imaging of the chest was performed following the standard protocol without IV contrast. COMPARISON:  Chest x-ray from yesterday FINDINGS: Cardiovascular: Cardiomegaly. Extensive aortic and coronary calcification. Mitral and aortic valve calcification with bulky mitral annular calcification. No pericardial effusion. Dual-chamber pacer leads into the right atrium and right ventricle. Mediastinum/Nodes: Negative for adenopathy. Thyroid thickening, likely goiter but limited by streak artifact from battery pack and arms. Moderate sliding hiatal hernia. Lungs/Pleura: Patchy streaky opacity with mosaic attenuation.  Small pleural effusions. Patient has negative COVID-19 test. Upper Abdomen: Cholecystectomy and atherosclerosis Musculoskeletal: Remote L1 superior endplate fracture. Advanced lower thoracic and upper lumbar degenerative disease. Advanced glenohumeral degenerative disease. IMPRESSION: 1. Patchy bilateral atelectasis with areas of air trapping. There could be superimposed infection, especially in the left lower lobe. 2. Cardiomegaly without pulmonary edema. 3. Moderate hiatal hernia. Electronically Signed   By: Marnee Spring M.D.   On: 09/04/2018 06:04   Dg Chest Port 1 View  Result Date: 09/03/2018 CLINICAL DATA:  Shortness of breath EXAM: PORTABLE CHEST 1 VIEW COMPARISON:  March 29, 2017 FINDINGS: Again noted is cardiomegaly. There is hazy airspace opacities seen throughout both lungs, right worse than left. No focal large airspace consolidation. No pleural effusion. IMPRESSION: 1. Hazy airspace opacities within both lungs, right greater than left. This could be due to pulmonary edema versus atypical infection, such as viral pneumonia. 2. Cardiomegaly Electronically Signed   By: Jonna Clark M.D.   On: 09/03/2018 23:46    Catarina Hartshorn, DO  [facility] Pager [phone]  If 7PM-7AM, please contact night-coverage [email or web address] Password [email or web address] 09/06/2018, 12:52 PM   LOS: 2 days

## 2018-09-07 ENCOUNTER — Inpatient Hospital Stay (HOSPITAL_COMMUNITY): Payer: Medicare Other

## 2018-09-07 ENCOUNTER — Other Ambulatory Visit (HOSPITAL_COMMUNITY): Payer: Self-pay | Admitting: Cardiology

## 2018-09-07 DIAGNOSIS — I050 Rheumatic mitral stenosis: Secondary | ICD-10-CM

## 2018-09-07 DIAGNOSIS — I429 Cardiomyopathy, unspecified: Secondary | ICD-10-CM

## 2018-09-07 DIAGNOSIS — I350 Nonrheumatic aortic (valve) stenosis: Secondary | ICD-10-CM

## 2018-09-07 DIAGNOSIS — I442 Atrioventricular block, complete: Secondary | ICD-10-CM

## 2018-09-07 DIAGNOSIS — Z7901 Long term (current) use of anticoagulants: Secondary | ICD-10-CM

## 2018-09-07 DIAGNOSIS — Z950 Presence of cardiac pacemaker: Secondary | ICD-10-CM

## 2018-09-07 DIAGNOSIS — I10 Essential (primary) hypertension: Secondary | ICD-10-CM

## 2018-09-07 DIAGNOSIS — J9601 Acute respiratory failure with hypoxia: Secondary | ICD-10-CM

## 2018-09-07 DIAGNOSIS — I5043 Acute on chronic combined systolic (congestive) and diastolic (congestive) heart failure: Secondary | ICD-10-CM

## 2018-09-07 LAB — PROTIME-INR
INR: 2.2 — ABNORMAL HIGH (ref 0.8–1.2)
Prothrombin Time: 24 seconds — ABNORMAL HIGH (ref 11.4–15.2)

## 2018-09-07 LAB — BASIC METABOLIC PANEL
Anion gap: 11 (ref 5–15)
BUN: 25 mg/dL — ABNORMAL HIGH (ref 8–23)
CO2: 31 mmol/L (ref 22–32)
Calcium: 8.6 mg/dL — ABNORMAL LOW (ref 8.9–10.3)
Chloride: 96 mmol/L — ABNORMAL LOW (ref 98–111)
Creatinine, Ser: 0.94 mg/dL (ref 0.44–1.00)
GFR calc Af Amer: >60 mL/min (ref >60)
GFR calc non Af Amer: 58 mL/min — ABNORMAL LOW (ref >60)
Glucose, Bld: 96 mg/dL (ref 70–99)
Potassium: 3.9 mmol/L (ref 3.5–5.1)
Sodium: 138 mmol/L (ref 135–145)

## 2018-09-07 LAB — MAGNESIUM: Magnesium: 2.2 mg/dL (ref 1.7–2.4)

## 2018-09-07 LAB — CBC
HCT: 41.4 % (ref 36.0–46.0)
Hemoglobin: 13.2 g/dL (ref 12.0–15.0)
MCH: 29.9 pg (ref 26.0–34.0)
MCHC: 31.9 g/dL (ref 30.0–36.0)
MCV: 93.7 fL (ref 80.0–100.0)
Platelets: 243 10*3/uL (ref 150–400)
RBC: 4.42 MIL/uL (ref 3.87–5.11)
RDW: 13.7 % (ref 11.5–15.5)
WBC: 7.4 10*3/uL (ref 4.0–10.5)
nRBC: 0 % (ref 0.0–0.2)

## 2018-09-07 LAB — GLUCOSE, CAPILLARY
Glucose-Capillary: 85 mg/dL (ref 70–99)
Glucose-Capillary: 184 mg/dL — ABNORMAL HIGH (ref 70–99)
Glucose-Capillary: 153 mg/dL — ABNORMAL HIGH (ref 70–99)
Glucose-Capillary: 142 mg/dL — ABNORMAL HIGH (ref 70–99)

## 2018-09-07 LAB — ECHOCARDIOGRAM LIMITED
Height: 68 in
Weight: 3820.13 oz

## 2018-09-07 MED ORDER — WARFARIN SODIUM 5 MG PO TABS
5.0000 mg | ORAL_TABLET | Freq: Once | ORAL | Status: AC
  Administered 2018-09-07: 5 mg via ORAL
  Filled 2018-09-07: qty 1

## 2018-09-07 MED ORDER — FUROSEMIDE 10 MG/ML IJ SOLN
20.0000 mg | Freq: Once | INTRAMUSCULAR | Status: AC
  Administered 2018-09-07: 20 mg via INTRAVENOUS
  Filled 2018-09-07: qty 2

## 2018-09-07 MED ORDER — PERFLUTREN LIPID MICROSPHERE
1.0000 mL | INTRAVENOUS | Status: AC | PRN
  Administered 2018-09-07: 1 mL via INTRAVENOUS
  Filled 2018-09-07: qty 10

## 2018-09-07 NOTE — Progress Notes (Signed)
ANTICOAGULATION CONSULT NOTE - Pharmacy Consult for warfarin Indication: recurrent DVT  Allergies  Allergen Reactions  . Codeine Anxiety  . Compazine Anaphylaxis  . Other Anaphylaxis, Rash and Other (See Comments)    Uncoded Allergy. Allergen: INOVAR Uncoded Allergy. Allergen: ALL ADHESIVES Uncoded Allergy. Allergen: SILK SUTURE Uncoded Allergy. Allergen: merthiolate Thermasol  . Penicillins Shortness Of Breath and Rash    Has patient had a PCN reaction causing immediate rash, facial/tongue/throat swelling, SOB or lightheadedness with hypotension: Yes Has patient had a PCN reaction causing severe rash involving mucus membranes or skin necrosis: No Has patient had a PCN reaction that required hospitalization No Has patient had a PCN reaction occurring within the last 10 years: Yes If all of the above answers are "NO", then may proceed with Cephalosporin use.   Marland Kitchen Morphine And Related Other (See Comments)    Patient states that it makes her lose her state of mind.   . Demerol [Meperidine] Rash    Patient Measurements: Height: 5\' 8"  (172.7 cm) Weight: 238 lb 12.1 oz (108.3 kg) IBW/kg (Calculated) : 63.9   Vital Signs: Temp: 98.4 F (36.9 C) (08/03 0524) Temp Source: Oral (08/03 0524) BP: 134/72 (08/03 0524) Pulse Rate: 63 (08/03 0524)  Labs: Recent Labs    09/05/18 0416 09/06/18 0443 09/07/18 0617  HGB 12.9 12.9 13.2  HCT 40.8 40.7 41.4  PLT 219 220 243  LABPROT 24.6* 24.1* 24.0*  INR 2.3* 2.2* 2.2*  CREATININE 1.23* 1.00 0.94    Estimated Creatinine Clearance: 63.6 mL/min (by C-G formula based on SCr of 0.94 mg/dL).   Medical History: Past Medical History:  Diagnosis Date  . Aortic stenosis   . Cataplexy   . Diastolic heart failure (HCC)   . Essential hypertension   . GERD (gastroesophageal reflux disease)   . Hiatal hernia   . History of recurrent deep vein thrombosis (DVT)   . Mitral stenosis   . Narcolepsy   . Neuropathy   . Renal disorder   .  Type 2 diabetes mellitus (HCC)     Medications:  Medications Prior to Admission  Medication Sig Dispense Refill Last Dose  . BYDUREON 2 MG PEN Inject 2 mg into the skin once a week.  5 Past Week at Unknown time  . carvedilol (COREG) 3.125 MG tablet TAKE 1 TABLET BY MOUTH TWICE DAILY WITH A MEAL 60 tablet 6 09/03/2018 at 1800  . celecoxib (CELEBREX) 200 MG capsule Take 200 mg by mouth daily. For arthritis   09/03/2018 at Unknown time  . Cholecalciferol (VITAMIN D3) 3000 UNITS TABS Take 1 capsule by mouth daily.   09/03/2018 at Unknown time  . clomiPRAMINE (ANAFRANIL) 25 MG capsule Take 25 mg by mouth 4 (four) times daily.    09/03/2018 at Unknown time  . clonazePAM (KLONOPIN) 1 MG tablet Take 1 mg by mouth at bedtime.    09/03/2018 at Unknown time  . DULoxetine (CYMBALTA) 60 MG capsule Take 60 mg by mouth daily.     09/03/2018 at Unknown time  . ferrous sulfate 325 (65 FE) MG tablet Take 325 mg by mouth daily with breakfast.   09/03/2018 at Unknown time  . furosemide (LASIX) 40 MG tablet Take 20 mg by mouth 2 (two) times daily.   5 09/03/2018 at Unknown time  . gabapentin (NEURONTIN) 300 MG capsule Take 300 mg by mouth 2 (two) times daily.   09/03/2018 at Unknown time  . insulin glargine (LANTUS) 100 unit/mL SOPN Inject 20 Units into the skin  2 (two) times daily.    09/03/2018 at Unknown time  . metFORMIN (GLUCOPHAGE) 500 MG tablet Take 500 mg by mouth 2 (two) times daily with a meal.   Past Week at Unknown time  . mirtazapine (REMERON) 15 MG tablet Take 15 mg by mouth at bedtime.     Past Week at Unknown time  . Multiple Vitamin (MULTIVITAMIN WITH MINERALS) TABS tablet Take 1 tablet by mouth daily.   09/03/2018 at Unknown time  . Oxycodone HCl 10 MG TABS Take 10 mg by mouth. Every 4 to 6 hours   09/03/2018 at Unknown time  . potassium chloride (K-DUR) 10 MEQ tablet Take 1 tablet (10 mEq total) by mouth daily. 90 tablet 3 09/03/2018 at Unknown time  . QUEtiapine (SEROQUEL) 100 MG tablet Take 100 mg by mouth  at bedtime.   Past Week at Unknown time  . vitamin C (ASCORBIC ACID) 500 MG tablet Take 500 mg by mouth daily.   09/03/2018 at Unknown time  . warfarin (COUMADIN) 5 MG tablet Take 2.5-5 mg by mouth every evening. Starting 10/29/2016 take 2.5 mg alternating with 5 mg daily    09/03/2018 at 1900    Assessment: Pharmacy consulted to dose warfarin in patient with recurrent DVT's.  Patient INR therapeutic at 2.2.  Patient's warfarin dose alternates 2.5 and 5 mg every other day.  Goal of Therapy:  INR 2-3 Monitor platelets by anticoagulation protocol: Yes   Plan:  Warfarin 5 mg x 1 dose. Monitor labs, INR, and s/s of bleeding.  Revonda Standard Raschelle Wisenbaker 09/07/2018,8:03 AM

## 2018-09-07 NOTE — Progress Notes (Signed)
PROGRESS NOTE  Rebecca FavreRena S [name] ZOX:[id number] DOB: 22-Apr-1940 DOA: 09/03/2018 PCP: Oval Linseyondiego, Richard, MD  Brief History: 78 year old female with a history of diastolic CHF, aortic stenosis, mitral stenosis, CKD stage III, hypertension, recurrent lower extremity DVT, diabetes mellitus, complete heart block status post permanent pacemaker presenting with 1 to 2-day history of shortness of breath and coughing. Unfortunately, the patient is a poor historian. All of this history is obtained from review the medical record and speaking with the patient's son. Upon presentation, the patient was noted to have some mild extremis with oxygen saturation 81%. She was placed on BiPAP. Initial chest x-ray showed bilateral opacities with increased interstitial markings. WBC was 16.2 with BNP 287. Patient was started on IV furosemide and levofloxacin for treatment of fluid overload and pneumonia.  Assessment/Plan: Acute respiratory failure with hypoxia -Secondary to fluid overload and pneumonia -Initially presented requiring BiPAP -Presently stable on 2 L nasal cannula -Wean oxygen as tolerated for saturation greater 92%  Lobar pneumonia -731 CT chest--patchy bilateral streaky opacities with mosaic attenuation. Small bilateral pleural effusions. Cannot rule out superimposed infection particularly in LLL -Procalcitonin peaked at 0.46 -Continue levofloxacin due to penicillin allergy  Acute on chronic diastolic CHF -09/04/2018 echo EF 40-45%, HK LV apex,severe LAE, moderate to severeMS,mild to moderateAS -8/3-repeat limited echo EF 55-60% -Continue intravenous furosemide -Daily weights -12/15/2017 office weight 235 pounds -09/03/2018 admissionweight290?? -09/07/18 weight 238.7 lbs -Accurate I's and O's--NEG 2.5 L in last 24 hours -Continue carvedilol -appreciate cardiology consult  Uncontrolled diabetes mellitus type 2 with hyperglycemia -Hemoglobin A1c 9.2 -Suspect a degree of  dietary indiscretion at home as the patient CBGs are well controlled during the hospitalization -Continue Lantus 20 units twice daily -Continue NovoLog sliding scale  CKD stage III -Baseline creatinine 1.0-1.2 -Monitor clinically with diuresis  Complete heart block -Status post PPM -personally reviewed EKG--paced rhythm  Hyponatremia -Secondary to fluid overload -Improving with furosemide  Recurrent DVT -Continue warfarin  Depression -continue Cymbalta      Disposition Plan: Home 8/4 if cleared by cardiology Family Communication:Son updated on phone 09/06/18  Consultants:none  Code Status: FULL   DVT Prophylaxis:warfarin   Procedures: As Listed in Progress Note Above  Antibiotics: Levofloxacin 7/30>>>    Subjective: Patient denies fevers, chills, headache, chest pain, dyspnea, nausea, vomiting, diarrhea, abdominal pain, dysuria, hematuria, hematochezia, and melena.   Objective: Vitals:   09/07/18 0500 09/07/18 0524 09/07/18 0817 09/07/18 1500  BP:  134/72  109/68  Pulse:  63    Resp:  16  18  Temp:  98.4 F (36.9 C)  98.1 F (36.7 C)  TempSrc:  Oral  Oral  SpO2:  91% 99% 97%  Weight: 108.3 kg     Height:        Intake/Output Summary (Last 24 hours) at 09/07/2018 1743 Last data filed at 09/07/2018 1500 Gross per 24 hour  Intake 243 ml  Output 1800 ml  Net -1557 ml   Weight change: -1.1 kg Exam:   General:  Pt is alert, follows commands appropriately, not in acute distress  HEENT: No icterus, No thrush, No neck mass, [place]/AT  Cardiovascular: RRR, S1/S2, no rubs, no gallops  Respiratory: fine bibasilar rales. No wheeze  Abdomen: Soft/+BS, non tender, non distended, no guarding  Extremities: No edema, No lymphangitis, No petechiae, No rashes, no synovitis   Data Reviewed: I have personally reviewed following labs and imaging studies Basic Metabolic Panel: Recent Labs  Lab 09/03/18 2256 09/04/18  0109  09/05/18 0416 09/06/18 0443 09/07/18 0617  NA 132*  --  135 136 138  K 4.9  --  3.7 3.7 3.9  CL 96*  --  97* 97* 96*  CO2 26  --  26 28 31   GLUCOSE 397*  --  128* 114* 96  BUN 29*  --  30* 26* 25*  CREATININE 1.27*  --  1.23* 1.00 0.94  CALCIUM 8.6*  --  8.5* 8.6* 8.6*  MG  --  2.1 2.0 2.1 2.2   Liver Function Tests: Recent Labs  Lab 09/03/18 2256  AST 55*  ALT 51*  ALKPHOS 74  BILITOT 0.9  PROT 7.9  ALBUMIN 4.0   No results for input(s): LIPASE, AMYLASE in the last 168 hours. No results for input(s): AMMONIA in the last 168 hours. Coagulation Profile: Recent Labs  Lab 09/03/18 2256 09/05/18 0416 09/06/18 0443 09/07/18 0617  INR 2.6* 2.3* 2.2* 2.2*   CBC: Recent Labs  Lab 09/03/18 2256 09/05/18 0416 09/06/18 0443 09/07/18 0617  WBC 16.2* 10.7* 9.1 7.4  NEUTROABS 14.3*  --   --   --   HGB 14.2 12.9 12.9 13.2  HCT 43.9 40.8 40.7 41.4  MCV 93.6 94.7 94.4 93.7  PLT 248 219 220 243   Cardiac Enzymes: No results for input(s): CKTOTAL, CKMB, CKMBINDEX, TROPONINI in the last 168 hours. BNP: Invalid input(s): POCBNP CBG: Recent Labs  Lab 09/06/18 1124 09/06/18 1617 09/06/18 2048 09/07/18 0813 09/07/18 1158  GLUCAP 256* 260* 116* 85 184*   HbA1C: No results for input(s): HGBA1C in the last 72 hours. Urine analysis:    Component Value Date/Time   COLORURINE YELLOW 03/02/2018 0850   APPEARANCEUR HAZY (A) 03/02/2018 0850   LABSPEC 1.023 03/02/2018 0850   PHURINE 5.0 03/02/2018 0850   GLUCOSEU 50 (A) 03/02/2018 0850   HGBUR NEGATIVE 03/02/2018 0850   BILIRUBINUR NEGATIVE 03/02/2018 0850   KETONESUR NEGATIVE 03/02/2018 0850   PROTEINUR NEGATIVE 03/02/2018 0850   UROBILINOGEN 0.2 09/05/2014 2015   NITRITE POSITIVE (A) 03/02/2018 0850   LEUKOCYTESUR NEGATIVE 03/02/2018 0850   Sepsis Labs: @LABRCNTIP (procalcitonin:4,lacticidven:4) ) Recent Results (from the past 240 hour(s))  SARS Coronavirus 2 (CEPHEID- Performed in [facility] lab), Hosp  Order     Status: None   Collection Time: 09/03/18 10:56 PM   Specimen: Nasopharyngeal Swab  Result Value Ref Range Status   SARS Coronavirus 2 NEGATIVE NEGATIVE Final    Comment: (NOTE) If result is NEGATIVE SARS-CoV-2 target nucleic acids are NOT DETECTED. The SARS-CoV-2 RNA is generally detectable in upper and lower  respiratory specimens during the acute phase of infection. The lowest  concentration of SARS-CoV-2 viral copies this assay can detect is 250  copies / mL. A negative result does not preclude SARS-CoV-2 infection  and should not be used as the sole basis for treatment or other  patient management decisions.  A negative result may occur with  improper specimen collection / handling, submission of specimen other  than nasopharyngeal swab, presence of viral mutation(s) within the  areas targeted by this assay, and inadequate number of viral copies  (<250 copies / mL). A negative result must be combined with clinical  observations, patient history, and epidemiological information. If result is POSITIVE SARS-CoV-2 target nucleic acids are DETECTED. The SARS-CoV-2 RNA is generally detectable in upper and lower  respiratory specimens dur ing the acute phase of infection.  Positive  results are indicative of active infection with SARS-CoV-2.  Clinical  correlation with  patient history and other diagnostic information is  necessary to determine patient infection status.  Positive results do  not rule out bacterial infection or co-infection with other viruses. If result is PRESUMPTIVE POSTIVE SARS-CoV-2 nucleic acids MAY BE PRESENT.   A presumptive positive result was obtained on the submitted specimen  and confirmed on repeat testing.  While 2019 novel coronavirus  (SARS-CoV-2) nucleic acids may be present in the submitted sample  additional confirmatory testing may be necessary for epidemiological  and / or clinical management purposes  to differentiate between  SARS-CoV-2  and other Sarbecovirus currently known to infect humans.  If clinically indicated additional testing with an alternate test  methodology [phone]) is advised. The SARS-CoV-2 RNA is generally  detectable in upper and lower respiratory sp ecimens during the acute  phase of infection. The expected result is Negative. Fact Sheet for Patients:  StrictlyIdeas.no Fact Sheet for Healthcare Providers: BankingDealers.co.za This test is not yet approved or cleared by the [place] FDA and has been authorized for detection and/or diagnosis of SARS-CoV-2 by FDA under an Emergency Use Authorization (EUA).  This EUA will remain in effect (meaning this test can be used) for the duration of the COVID-19 declaration under Section 564(b)(1) of the Act, 21 U.S.C. section 360bbb-3(b)(1), unless the authorization is terminated or revoked sooner. Performed at [facility], [address] [place], [place] [address]   Blood culture (routine x 2)     Status: None (Preliminary result)   Collection Time: 09/04/18  6:49 AM   Specimen: BLOOD RIGHT HAND  Result Value Ref Range Status   Specimen Description BLOOD RIGHT HAND  Final   Special Requests   Final    BOTTLES DRAWN AEROBIC AND ANAEROBIC Blood Culture results may not be optimal due to an excessive volume of blood received in culture bottles   Culture   Final    NO GROWTH 3 DAYS Performed at [facility], [address] [place], [place] [address]    Report Status PENDING  Incomplete  Blood culture (routine x 2)     Status: None (Preliminary result)   Collection Time: 09/04/18  6:51 AM   Specimen: BLOOD LEFT HAND  Result Value Ref Range Status   Specimen Description BLOOD LEFT HAND  Final   Special Requests   Final    BOTTLES DRAWN AEROBIC AND ANAEROBIC Blood Culture adequate volume   Culture   Final    NO GROWTH 3 DAYS Performed at [facility], [address], [place], [place] [address]    Report  Status PENDING  Incomplete  MRSA PCR Screening     Status: None   Collection Time: 09/04/18 10:55 AM   Specimen: Nasopharyngeal  Result Value Ref Range Status   MRSA by PCR NEGATIVE NEGATIVE Final    Comment:        The GeneXpert MRSA Assay (FDA approved for NASAL specimens only), is one component of a comprehensive MRSA colonization surveillance program. It is not intended to diagnose MRSA infection nor to guide or monitor treatment for MRSA infections. Performed at [facility], [address], [place], [place] [address]      Scheduled Meds:  carvedilol  3.125 mg Oral BID WC   DULoxetine  60 mg Oral Daily   ferrous sulfate  325 mg Oral Q breakfast   furosemide  20 mg Oral BID   gabapentin  300 mg Oral BID   insulin aspart  0-20 Units Subcutaneous TID WC   insulin glargine  20 Units Subcutaneous BID   mouth rinse  15 mL Mouth Rinse BID   nystatin   Topical BID   potassium chloride  10 mEq Oral Daily   sodium chloride flush  3 mL Intravenous Q12H   warfarin  5 mg Oral Once   Warfarin - Pharmacist Dosing Inpatient   Does not apply q1800   Continuous Infusions:  sodium chloride     levofloxacin (LEVAQUIN) IV 750 mg (09/07/18 0548)    Procedures/Studies: Ct Chest Wo Contrast  Result Date: 09/04/2018 CLINICAL DATA:  Shortness of breath EXAM: CT CHEST WITHOUT CONTRAST TECHNIQUE: Multidetector CT imaging of the chest was performed following the standard protocol without IV contrast. COMPARISON:  Chest x-ray from yesterday FINDINGS: Cardiovascular: Cardiomegaly. Extensive aortic and coronary calcification. Mitral and aortic valve calcification with bulky mitral annular calcification. No pericardial effusion. Dual-chamber pacer leads into the right atrium and right ventricle. Mediastinum/Nodes: Negative for adenopathy. Thyroid thickening, likely goiter but limited by streak artifact from battery pack and arms. Moderate sliding hiatal hernia. Lungs/Pleura: Patchy  streaky opacity with mosaic attenuation. Small pleural effusions. Patient has negative COVID-19 test. Upper Abdomen: Cholecystectomy and atherosclerosis Musculoskeletal: Remote L1 superior endplate fracture. Advanced lower thoracic and upper lumbar degenerative disease. Advanced glenohumeral degenerative disease. IMPRESSION: 1. Patchy bilateral atelectasis with areas of air trapping. There could be superimposed infection, especially in the left lower lobe. 2. Cardiomegaly without pulmonary edema. 3. Moderate hiatal hernia. Electronically Signed   By: Marnee Spring M.D.   On: 09/04/2018 06:04   Dg Chest Port 1 View  Result Date: 09/03/2018 CLINICAL DATA:  Shortness of breath EXAM: PORTABLE CHEST 1 VIEW COMPARISON:  March 29, 2017 FINDINGS: Again noted is cardiomegaly. There is hazy airspace opacities seen throughout both lungs, right worse than left. No focal large airspace consolidation. No pleural effusion. IMPRESSION: 1. Hazy airspace opacities within both lungs, right greater than left. This could be due to pulmonary edema versus atypical infection, such as viral pneumonia. 2. Cardiomegaly Electronically Signed   By: Jonna Clark M.D.   On: 09/03/2018 23:46    Catarina Hartshorn, DO  [facility] Pager [phone]  If 7PM-7AM, please contact night-coverage [email or web address] Password [email or web address] 09/07/2018, 5:43 PM   LOS: 3 days

## 2018-09-07 NOTE — Progress Notes (Signed)
*  PRELIMINARY RESULTS* Echocardiogram 2D Echocardiogram LIMITED has been performed.  Rebecca [name] 09/07/2018, 2:52 PM

## 2018-09-07 NOTE — Consult Note (Addendum)
Cardiology Consultation:   Patient ID: Rebecca [name]; [id number]; 03-30-1940   Admit date: 09/03/2018 Date of Consult: 09/07/2018  Primary Care Provider: Oval Linseyondiego, Richard, MD Primary Cardiologist: Prentice DockerSuresh Yashica Sterbenz, MD 12/15/2017 Primary Electrophysiologist:  None   Patient Profile:   Rebecca [name] is a 78 y.o. 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  History of Present Illness:   Rebecca [name] was doing well at office visit 12/2017. Wt 235 lbs  She was admitted 07/31 w/ CHF requiring BiPAP, O2 sats initially 81%. EF decreased on echo, cards asked to see.  Rebecca [name] does not weigh daily but says she can.  She does put some salt on foods but says she does not overuse it.  She admits that she drinks a lot of water and also drinks coffee.  She also loves to eat ice.  Her son told her that he could tell she was getting more short of breath and coughing more, but she says she did not really notice it.  She does describe waking up in the night coughing.  There is possible orthopnea.  She has significant dyspnea on exertion, but her activity level is not very high at baseline.  The reason she came to the hospital, was because she kept coughing and could not get her breath.  She is improved.  She states she urinated quite a bit yesterday.  She has not had chest pain or palpitations.  No presyncope or syncope.  There have been no acute life events recently.   Past Medical History:  Diagnosis Date  . Aortic stenosis   . Cataplexy   . Diastolic heart failure (HCC)   . Essential hypertension   . GERD (gastroesophageal reflux disease)   . Hiatal hernia   . History of recurrent deep vein thrombosis (DVT)   . Mitral stenosis   . Narcolepsy   . Neuropathy   . Renal disorder   . Type 2  diabetes mellitus (HCC)     Past Surgical History:  Procedure Laterality Date  . ABDOMINAL HYSTERECTOMY    . CARPAL TUNNEL RELEASE    . CARPECTOMY  12/26/2011   Procedure: CARPECTOMY;  Surgeon: Wyn Forsterobert V Sypher Jr., MD;  Location: [facility];  Service: Orthopedics;  Laterality: Right;  PROXIMAL ROW CARPECTOMY RIGHT WRIST and neurectomy  . CHOLECYSTECTOMY    . HEMORROIDECTOMY    . KIDNEY SURGERY    . NEPHROSTOMY    . PACEMAKER IMPLANT N/A 10/25/2016   Procedure: Pacemaker Implant;  Surgeon: Regan Lemmingamnitz, Will Martin, MD;  [facility];  Service: Cardiovascular;  Laterality: N/A;  . TOTAL HIP ARTHROPLASTY     bilateral     Prior to Admission medications   Medication Sig Start Date End Date Taking? Authorizing Provider  BYDUREON 2 MG PEN Inject 2 mg into the skin once a week. 01/03/16  Yes [provider]  carvedilol (COREG) 3.125 MG tablet TAKE 1 TABLET BY MOUTH TWICE DAILY WITH A MEAL 02/13/18  Yes Laqueta LindenKoneswaran, Carmaleta Youngers A, MD  celecoxib (CELEBREX) 200 MG capsule Take 200 mg by mouth daily. For arthritis   Yes [provider]  Cholecalciferol (VITAMIN D3) 3000 UNITS TABS Take 1 capsule by mouth daily.   Yes [provider]  clomiPRAMINE (ANAFRANIL)  25 MG capsule Take 25 mg by mouth 4 (four) times daily.    Yes [provider]  clonazePAM (KLONOPIN) 1 MG tablet Take 1 mg by mouth at bedtime.    Yes [provider]  DULoxetine (CYMBALTA) 60 MG capsule Take 60 mg by mouth daily.     Yes [provider]  ferrous sulfate 325 (65 FE) MG tablet Take 325 mg by mouth daily with breakfast.   Yes [provider]  furosemide (LASIX) 40 MG tablet Take 20 mg by mouth 2 (two) times daily.  12/01/14  Yes [provider]  gabapentin (NEURONTIN) 300 MG capsule Take 300 mg by mouth 2 (two) times daily.   Yes [provider]  insulin glargine (LANTUS) 100 unit/mL SOPN Inject 20 Units into the skin 2 (two)  times daily.    Yes [provider]  metFORMIN (GLUCOPHAGE) 500 MG tablet Take 500 mg by mouth 2 (two) times daily with a meal.   Yes [provider]  mirtazapine (REMERON) 15 MG tablet Take 15 mg by mouth at bedtime.     Yes [provider]  Multiple Vitamin (MULTIVITAMIN WITH MINERALS) TABS tablet Take 1 tablet by mouth daily.   Yes [provider]  Oxycodone HCl 10 MG TABS Take 10 mg by mouth. Every 4 to 6 hours   Yes [provider]  potassium chloride (K-DUR) 10 MEQ tablet Take 1 tablet (10 mEq total) by mouth daily. 09/21/14  Yes Dyann KiefLenze, Michele M, PA-C  QUEtiapine (SEROQUEL) 100 MG tablet Take 100 mg by mouth at bedtime.   Yes [provider]  vitamin C (ASCORBIC ACID) 500 MG tablet Take 500 mg by mouth daily.   Yes [provider]  warfarin (COUMADIN) 5 MG tablet Take 2.5-5 mg by mouth every evening. Starting 10/29/2016 take 2.5 mg alternating with 5 mg daily    Yes [provider]    Inpatient Medications: Scheduled Meds: . carvedilol  3.125 mg Oral BID WC  . DULoxetine  60 mg Oral Daily  . ferrous sulfate  325 mg Oral Q breakfast  . furosemide  20 mg Oral BID  . gabapentin  300 mg Oral BID  . insulin aspart  0-20 Units Subcutaneous TID WC  . insulin glargine  20 Units Subcutaneous BID  . mouth rinse  15 mL Mouth Rinse BID  . nystatin   Topical BID  . potassium chloride  10 mEq Oral Daily  . sodium chloride flush  3 mL Intravenous Q12H  . warfarin  5 mg Oral Once  . Warfarin - Pharmacist Dosing Inpatient   Does not apply q1800   Continuous Infusions: . sodium chloride    . levofloxacin (LEVAQUIN) IV 750 mg (09/07/18 0548)   PRN Meds: sodium chloride, acetaminophen, clonazePAM, hydrALAZINE, ipratropium-albuterol, ondansetron (ZOFRAN) IV, oxyCODONE, polyethylene glycol, senna-docusate, sodium chloride flush  Allergies:    Allergies  Allergen Reactions  . Codeine Anxiety  . Compazine Anaphylaxis  . Other  Anaphylaxis, Rash and Other (See Comments)    Uncoded Allergy. Allergen: INOVAR Uncoded Allergy. Allergen: ALL ADHESIVES Uncoded Allergy. Allergen: SILK SUTURE Uncoded Allergy. Allergen: merthiolate Thermasol  . Penicillins Shortness Of Breath and Rash    Has patient had a PCN reaction causing immediate rash, facial/tongue/throat swelling, SOB or lightheadedness with hypotension: Yes Has patient had a PCN reaction causing severe rash involving mucus membranes or skin necrosis: No Has patient had a PCN reaction that required hospitalization No Has patient had a PCN reaction  occurring within the last 10 years: Yes If all of the above answers are "NO", then may proceed with Cephalosporin use.   Marland Kitchen Morphine And Related Other (See Comments)    Patient states that it makes her lose her state of mind.   . Demerol [Meperidine] Rash    Social History:   Social History   Socioeconomic History  . Marital status: Widowed    Spouse name: Not on file  . Number of children: Not on file  . Years of education: 71  . Highest education level: Not on file  Occupational History    Employer: RETIRED  Social Needs  . Financial resource strain: Not on file  . Food insecurity    Worry: Not on file    Inability: Not on file  . Transportation needs    Medical: Not on file    Non-medical: Not on file  Tobacco Use  . Smoking status: Former Smoker    Quit date: 10/13/1988    Years since quitting: 29.9  . Smokeless tobacco: Never Used  Substance and Sexual Activity  . Alcohol use: No    Alcohol/week: 0.0 standard drinks  . Drug use: No  . Sexual activity: Not on file  Lifestyle  . Physical activity    Days per week: Not on file    Minutes per session: Not on file  . Stress: Not on file  Relationships  . Social Musician on phone: Not on file    Gets together: Not on file    Attends religious service: Not on file    Active member of club or organization: Not on file    Attends  meetings of clubs or organizations: Not on file    Relationship status: Not on file  . Intimate partner violence    Fear of current or ex partner: Not on file    Emotionally abused: Not on file    Physically abused: Not on file    Forced sexual activity: Not on file  Other Topics Concern  . Not on file  Social History Narrative  . Not on file    Family History:   Family History  Problem Relation Age of Onset  . Heart disease Father   . Diabetes Other   . Stroke Mother   . Scleroderma Brother    Family Status:  Family Status  Relation Name Status  . Father  (Not Specified)  . Other  (Not Specified)  . Mother  (Not Specified)  . Brother  (Not Specified)  . Other widowed Alive  . Child 2 Alive       fair health.     ROS:  Please see the history of present illness.  All other ROS reviewed and negative.     Physical Exam/Data:   Vitals:   09/06/18 2138 09/07/18 0500 09/07/18 0524 09/07/18 0817  BP: 134/62  134/72   Pulse: 63  63   Resp: 16  16   Temp: 97.7 F (36.5 C)  98.4 F (36.9 C)   TempSrc: Oral  Oral   SpO2: 98%  91% 99%  Weight:  108.3 kg    Height:        Intake/Output Summary (Last 24 hours) at 09/07/2018 0903 Last data filed at 09/07/2018 0118 Gross per 24 hour  Intake 603 ml  Output 950 ml  Net -347 ml   Filed Weights   09/03/18 2253 09/06/18 0454 09/07/18 0500  Weight: 131.5 kg 109.4 kg 108.3  kg   Body mass index is 36.3 kg/m.  General:  Well nourished, well developed, in no acute distress HEENT: normal Lymph: no adenopathy Neck:  JVD 9 cm Endocrine:  No thryomegaly Vascular: No carotid bruits; 4/4 extremity pulses 2+, without bruits  Cardiac:  normal S1, S2; RRR; 2/6 murmur  Lungs:  clear to auscultation bilaterally, no wheezing, rhonchi or rales  Abd: soft, nontender, no hepatomegaly  Ext: no edema Musculoskeletal:  No deformities, BUE and BLE strength normal and equal Skin: warm and dry  Neuro:  CNs 2-12 intact, no focal  abnormalities noted Psych:  Normal affect   EKG:  The EKG was personally reviewed and demonstrates:  SR, V pacing, HR 91, QRS duration 144 ms, was 152 ms Telemetry:  Telemetry was personally reviewed and demonstrates: Some atrial pacing, ventricular pacing  Relevant CV Studies:  ECHO: 09/04/2018  1. The left ventricle has mild-moderately reduced systolic function, with an ejection fraction of 40-45%. The cavity size was normal. There is mildly increased left ventricular wall thickness. Left ventricular diastolic Doppler parameters are consistent with impaired relaxation. Elevated mean left atrial pressure.  2. The LV apex is hypokinetic.  3. The right ventricle has normal systolic function. The cavity was normal. There is no increase in right ventricular wall thickness.  4. Left atrial size was severely dilated.  5. Right atrial size was mildly dilated.  6. The aortic valve is tricuspid. Aortic valve regurgitation is mild by color flow Doppler. Mild-moderate stenosis of the aortic valve. Severe aortic annular calcification noted.  Mean gradient 17 mmHg, peak gradient 29.1 mmHg  7. The mitral valve is abnormal. Moderate thickening of the mitral valve leaflet. Moderate calcification of the mitral valve leaflet. There is severe mitral annular calcification present. Moderate-severe mitral valve stenosis. Mean gradient 12 mmHg, peak gradient 31.6 mmHg  8. The aorta is normal in size and structure.  9. The aortic root is normal in size and structure. 10. Pulmonary hypertension is indeterminate, inadequate TR jet. 11. The apex appears to be newly hypokinetic since 10/23/2016 study with decrease in LVEF. There is some limited visualizaiton in the apical views, would recommend a limited contrast study to verify this new findings prior to pursuing additional workup.  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  Laboratory Data:  Chemistry Recent Labs  Lab 09/05/18  0416 09/06/18 0443 09/07/18 0617  NA 135 136 138  K 3.7 3.7 3.9  CL 97* 97* 96*  CO2 26 28 31   GLUCOSE 128* 114* 96  BUN 30* 26* 25*  CREATININE 1.23* 1.00 0.94  CALCIUM 8.5* 8.6* 8.6*  GFRNONAA 42* 54* 58*  GFRAA 49* >60 >60  ANIONGAP 12 11 11     Lab Results  Component Value Date   ALT 51 (H) 09/03/2018   AST 55 (H) 09/03/2018   ALKPHOS 74 09/03/2018   BILITOT 0.9 09/03/2018   Hematology Recent Labs  Lab 09/05/18 0416 09/06/18 0443 09/07/18 0617  WBC 10.7* 9.1 7.4  RBC 4.31 4.31 4.42  HGB 12.9 12.9 13.2  HCT 40.8 40.7 41.4  MCV 94.7 94.4 93.7  MCH 29.9 29.9 29.9  MCHC 31.6 31.7 31.9  RDW 14.1 13.8 13.7  PLT 219 220 243   Cardiac Enzymes High Sensitivity Troponin:   Recent Labs  Lab 09/03/18 2256 09/04/18 0109  TROPONINIHS 33* 85*      BNP Recent Labs  Lab 09/03/18 2256 09/06/18 0443  BNP 287.0* 555.0*    TSH:  Lab Results  Component Value Date   TSH 1.052 09/04/2018   HgbA1c: Lab Results  Component Value Date   HGBA1C 9.2 (H) 09/04/2018   Magnesium:  Magnesium  Date Value Ref Range Status  09/07/2018 2.2 1.7 - 2.4 mg/dL Final    Comment:    Performed at [facility], [address], [place], [place] [address]   Lab Results  Component Value Date   INR 2.2 (H) 09/07/2018   INR 2.2 (H) 09/06/2018   INR 2.3 (H) 09/05/2018     Radiology/Studies:  Ct Chest Wo Contrast  Result Date: 09/04/2018 CLINICAL DATA:  Shortness of breath EXAM: CT CHEST WITHOUT CONTRAST TECHNIQUE: Multidetector CT imaging of the chest was performed following the standard protocol without IV contrast. COMPARISON:  Chest x-ray from yesterday FINDINGS: Cardiovascular: Cardiomegaly. Extensive aortic and coronary calcification. Mitral and aortic valve calcification with bulky mitral annular calcification. No pericardial effusion. Dual-chamber pacer leads into the right atrium and right ventricle. Mediastinum/Nodes: Negative for adenopathy. Thyroid thickening, likely goiter  but limited by streak artifact from battery pack and arms. Moderate sliding hiatal hernia. Lungs/Pleura: Patchy streaky opacity with mosaic attenuation. Small pleural effusions. Patient has negative COVID-19 test. Upper Abdomen: Cholecystectomy and atherosclerosis Musculoskeletal: Remote L1 superior endplate fracture. Advanced lower thoracic and upper lumbar degenerative disease. Advanced glenohumeral degenerative disease. IMPRESSION: 1. Patchy bilateral atelectasis with areas of air trapping. There could be superimposed infection, especially in the left lower lobe. 2. Cardiomegaly without pulmonary edema. 3. Moderate hiatal hernia. Electronically Signed   By: Marnee Spring M.D.   On: 09/04/2018 06:04   Dg Chest Port 1 View  Result Date: 09/03/2018 CLINICAL DATA:  Shortness of breath EXAM: PORTABLE CHEST 1 VIEW COMPARISON:  March 29, 2017 FINDINGS: Again noted is cardiomegaly. There is hazy airspace opacities seen throughout both lungs, right worse than left. No focal large airspace consolidation. No pleural effusion. IMPRESSION: 1. Hazy airspace opacities within both lungs, right greater than left. This could be due to pulmonary edema versus atypical infection, such as viral pneumonia. 2. Cardiomegaly Electronically Signed   By: Jonna Clark M.D.   On: 09/03/2018 23:46    Assessment and Plan:   1. Acute on chronic combined systolic and diastolic CHF - Initial wt unclear, on 08/02 was 241 lbs, now 238 - dry wt was 235 lbs 12/2017, feel she needs more diuresis - will give a dose of Lasix 40 mg IV, follow I/O and wt - BUN/Cr trending down, follow - close to dry wt, but want to get her as dry as possible, to maximize respiratory status  2.  Cardiomyopathy, valvular heart disease - We will clarify wall motion abnormalities with limited Definity study today. - She was on low-dose beta-blocker, discuss with MD increasing this versus adding low-dose ARB -Unclear the contribution of her valvular  abnormalities to the CHF - Once wall motion abnormalities are reviewed, likely needs cardiac catheterization.  Would make this a right and left heart cath to determine pressures and refer to Structural Heart team  3.  History of complete heart block, [employer]tronic pacemaker - Upon review of her remote transmission from June, she is V pacing 99.9% of the time and A pacing 5.2% of the time. - Unclear if her EF decrease could be from a pacemaker mediated cardiomyopathy  Otherwise, per IM Active Problems:   Essential hypertension   Hyponatremia   Chronic anticoagulation-Coumadin   Acute respiratory failure with hypoxia (HCC)   CKD (chronic kidney disease), stage III (HCC)   CHF (  congestive heart failure) (HCC)   Lobar pneumonia (HCC)   Acute on chronic diastolic CHF (congestive heart failure) (HCC)   For questions or updates, please contact [facility] Please consult [email or web address] for contact info under Cardiology/STEMI.   Signed, Theodore DemarkRhonda Barrett, PA-C  09/07/2018 9:03 AM  The patient was seen and examined, and I agree with the history, physical exam, assessment and plan as documented above, with modifications as noted below. I have also personally reviewed all relevant documentation, old records, labs, and both radiographic and cardiovascular studies. I have also independently interpreted old and new ECG's.  This is a 78 year old woman who is well-known to me.  I last saw her in the office in November 2019.  She has a history of high-grade AV block with pacemaker placement in September 2018.  She also has valvular heart disease with aortic stenosis and moderate to severe mitral stenosis and chronic diastolic heart failure.  Echocardiogram in July 2019 demonstrated normal LVEF 60 to 65%.  Echocardiogram performed during this admission shows a decline in EF to 40 to 45%.  Mitral stenosis remains in the moderate to severe range.  Mean gradient is 12 mmHg.  There was also some degree of left  ventricular hypokinesis.  She had been on IV diuretics and was switched to oral diuretics 20 mg twice daily by internal medicine.  Her breathing appears to be at baseline.  She is physically deconditioned and gets very little activity at home.  Most recent device interrogation in June did not demonstrate any evidence of atrial fibrillation although she is at high risk for this given her severe left atrial enlargement and mitral stenosis.  We will give an additional dose of IV Lasix today to get her closer to euvolemia.    She has severe mitral annular calcification and I do not feel she would be a candidate for percutaneous mitral balloon valvuloplasty, and would eventually need mitral valve replacement when the time comes.  I think she would benefit from a referral to the structural heart team as well as right and left heart catheterization to evaluate valvular hemodynamics as well as for obstructive coronary disease.   Prentice DockerSuresh Donna Snooks, MD, [facility]FACC  09/07/2018 10:24 AM

## 2018-09-07 NOTE — Care Management (Signed)
[facility] [phone]   [place] to my Favorites Quality of Patient Care Rating4 out of 5 stars Patient Survey Summary Rating4 out of [place] [phone]   [place] to my Favorites Quality of Patient Care Rating3 out of 5 stars Patient Survey Summary Rating5 out of [place] [phone]   [place] to my Favorites Quality of Patient Care Rating3 out of 5 stars Patient Survey Summary Rating4 out of [place] [phone]   [place] to my [place]  out of 5 stars Patient Survey Summary Rating4 out of [place] [phone]   [place] to my [place]  out of 5 stars Patient Survey Summary Rating3 out of [place] [phone]) [phone]   Add [facility], [place] to my Favorites Quality of Patient Care Rating4 out of 5 stars Patient Survey Summary Rating4 out of [place] [phone]   [place] to my Favorites Quality of Patient Care Rating4 out of 5 stars Patient Survey Summary Rating4 out of [place] [phone]   [place] to my Favorites Quality of Patient Care Rating4 out of 5 stars Patient Survey Summary Rating4 out of [place] AGE [phone]   [place] AGE to my Favorites Quality of Patient Care Rating3 out of 5 stars Patient Survey Summary Rating3 out of 5 stars  [facility] [place] [phone]   Add [facility] [place] to my Favorites Quality of Patient Care Rating3  out of 5 stars Patient Survey Summary Rating4 out of [place] [phone]   [place] to my  Favorites Quality of Patient Care Rating3 out of 5 stars Patient Survey Summary Rating4 out of 5 stars  [facility] [phone]   Add [facility] to my Favorites Quality of Patient Care Rating3  out of 5 stars Patient Survey Summary Rating3 out of [place] [phone]   [place] to my Favorites Quality of Patient Care Rating3  out of 5 stars Not [place] [phone] [place] to my Favorites Quality of Patient Care Rating4  out of 5 stars Patient Survey Summary Rating

## 2018-09-07 NOTE — TOC Progression Note (Addendum)
Transition of Care [facility]) - Progression Note    Patient Details  Name: Rebecca [name] MRN: [id number] Date of Birth: [birth year]  [facility] CM/SW Contact  Kalee Mcclenathan, Chauncey [place], RN Phone Number: 09/07/2018, 10:24 AM  Clinical Narrative:   Patient recommended for SNF. Patient prefers to go home with home health PT. Call to son to discuss. Family agrees with home health PT. Discussed PT eval, patient walking 20 feet, son feels they can manage this at home. He is with patient  beginning at 4:30 pm and through out the night. Patient's niece is next door and availale some during the day, if not, son says he is a call away and can come home during the day if needed. Discussed home health PT, son elects [facility] as patient has had them in the past.   [place] on oxygen, will need home O2 eval prior to DC.   Expected Discharge Plan and Services Expected Discharge Plan: Home/Self Care       Living arrangements for the past 2 months: [place]: PT [place]: [place] ([place]) Date [place]: 09/07/18 Time [place]: 1027 Representative spoke with at [place]: [place]     Expected Discharge Plan: Home/Self Care Barriers to Discharge: No Barriers Identified    Living arrangements for the past 2 months: [place]

## 2018-09-07 NOTE — Care Management Important Message (Signed)
Important Message  Patient Details  Name: Rebecca [name] MRN: [id number] Date of Birth: [birth year]   Medicare Important Message Given:  Yes     Tommy Medal 09/07/2018, 2:45 PM

## 2018-09-08 DIAGNOSIS — N183 Chronic kidney disease, stage 3 (moderate): Secondary | ICD-10-CM

## 2018-09-08 DIAGNOSIS — I38 Endocarditis, valve unspecified: Secondary | ICD-10-CM

## 2018-09-08 DIAGNOSIS — I5033 Acute on chronic diastolic (congestive) heart failure: Secondary | ICD-10-CM

## 2018-09-08 LAB — PROTIME-INR
INR: 2 — ABNORMAL HIGH (ref 0.8–1.2)
Prothrombin Time: 22.4 seconds — ABNORMAL HIGH (ref 11.4–15.2)

## 2018-09-08 LAB — BASIC METABOLIC PANEL
Anion gap: 11 (ref 5–15)
BUN: 24 mg/dL — ABNORMAL HIGH (ref 8–23)
CO2: 29 mmol/L (ref 22–32)
Calcium: 9 mg/dL (ref 8.9–10.3)
Chloride: 97 mmol/L — ABNORMAL LOW (ref 98–111)
Creatinine, Ser: 0.88 mg/dL (ref 0.44–1.00)
GFR calc Af Amer: >60 mL/min (ref >60)
GFR calc non Af Amer: >60 mL/min (ref >60)
Glucose, Bld: 107 mg/dL — ABNORMAL HIGH (ref 70–99)
Potassium: 4.2 mmol/L (ref 3.5–5.1)
Sodium: 137 mmol/L (ref 135–145)

## 2018-09-08 LAB — GLUCOSE, CAPILLARY
Glucose-Capillary: 101 mg/dL — ABNORMAL HIGH (ref 70–99)
Glucose-Capillary: 167 mg/dL — ABNORMAL HIGH (ref 70–99)

## 2018-09-08 LAB — CBC
HCT: 45 % (ref 36.0–46.0)
Hemoglobin: 14.6 g/dL (ref 12.0–15.0)
MCH: 30.4 pg (ref 26.0–34.0)
MCHC: 32.4 g/dL (ref 30.0–36.0)
MCV: 93.8 fL (ref 80.0–100.0)
Platelets: 282 10*3/uL (ref 150–400)
RBC: 4.8 MIL/uL (ref 3.87–5.11)
RDW: 13.2 % (ref 11.5–15.5)
WBC: 7.8 10*3/uL (ref 4.0–10.5)
nRBC: 0 % (ref 0.0–0.2)

## 2018-09-08 LAB — MAGNESIUM: Magnesium: 2.3 mg/dL (ref 1.7–2.4)

## 2018-09-08 MED ORDER — WARFARIN SODIUM 5 MG PO TABS: 5.0000 mg | ORAL_TABLET | Freq: Once | ORAL | Status: DC

## 2018-09-08 NOTE — Progress Notes (Addendum)
Progress Note  Patient Name: Rebecca [name] Date of Encounter: 09/08/2018  Primary Cardiologist: Prentice DockerSuresh Henrine Hayter, MD   Subjective   Reports her breathing has improved. No chest pain or palpitations. Anxious to go home today and becomes tearful when talking about this as she has not been able to sleep well in the hospital.   Inpatient Medications    Scheduled Meds: . carvedilol  3.125 mg Oral BID WC  . DULoxetine  60 mg Oral Daily  . ferrous sulfate  325 mg Oral Q breakfast  . furosemide  20 mg Oral BID  . gabapentin  300 mg Oral BID  . insulin aspart  0-20 Units Subcutaneous TID WC  . insulin glargine  20 Units Subcutaneous BID  . mouth rinse  15 mL Mouth Rinse BID  . nystatin   Topical BID  . potassium chloride  10 mEq Oral Daily  . sodium chloride flush  3 mL Intravenous Q12H  . Warfarin - Pharmacist Dosing Inpatient   Does not apply q1800   Continuous Infusions: . sodium chloride    . levofloxacin (LEVAQUIN) IV 750 mg (09/08/18 0624)   PRN Meds: sodium chloride, acetaminophen, clonazePAM, hydrALAZINE, ipratropium-albuterol, ondansetron (ZOFRAN) IV, oxyCODONE, polyethylene glycol, senna-docusate, sodium chloride flush   Vital Signs    Vitals:   09/07/18 0817 09/07/18 1500 09/07/18 1841 09/07/18 2102  BP:  109/68  140/86  Pulse:   73 71  Resp:  18  18  Temp:  98.1 F (36.7 C)  (!) 97.4 F (36.3 C)  TempSrc:  Oral  Oral  SpO2: 99% 97%  100%  Weight:      Height:        Intake/Output Summary (Last 24 hours) at 09/08/2018 0854 Last data filed at 09/07/2018 2108 Gross per 24 hour  Intake 843 ml  Output 1750 ml  Net -907 ml    Last 3 Weights 09/07/2018 09/06/2018 09/03/2018  Weight (lbs) 238 lb 12.1 oz 241 lb 2.9 oz 290 lb  Weight (kg) 108.3 kg 109.4 kg 131.543 kg      Telemetry    V-paced, HR in 70's to 80's with occasional PVC's. - Personally Reviewed  ECG    No new tracings.   Physical Exam   General: Well developed, well nourished, female appearing  in no acute distress. Head: Normocephalic, atraumatic.  Neck: Supple without bruits, JVD difficult to assess given body habitus. Lungs:  Resp regular and unlabored, scattered rales along bases. Heart: RRR, S1, S2, no S3, S4, or murmur; no rub. Abdomen: Soft, non-tender, non-distended with normoactive bowel sounds. No hepatomegaly. No rebound/guarding. No obvious abdominal masses. Extremities: No clubbing, cyanosis, or lower extremity edema. Distal pedal pulses are 2+ bilaterally. Neuro: Alert and oriented X 3. Moves all extremities spontaneously. Psych: Normal affect.  Labs    Chemistry Recent Labs  Lab 09/03/18 2256  09/06/18 0443 09/07/18 0617 09/08/18 0643  NA 132*   < > 136 138 137  K 4.9   < > 3.7 3.9 4.2  CL 96*   < > 97* 96* 97*  CO2 26   < > 28 31 29   GLUCOSE 397*   < > 114* 96 107*  BUN 29*   < > 26* 25* 24*  CREATININE 1.27*   < > 1.00 0.94 0.88  CALCIUM 8.6*   < > 8.6* 8.6* 9.0  PROT 7.9  --   --   --   --   ALBUMIN 4.0  --   --   --   --  AST 55*  --   --   --   --   ALT 51*  --   --   --   --   ALKPHOS 74  --   --   --   --   BILITOT 0.9  --   --   --   --   GFRNONAA 40*   < > 54* 58* >60  GFRAA 47*   < > >60 >60 >60  ANIONGAP 10   < > 11 11 11    < > = values in this interval not displayed.     Hematology Recent Labs  Lab 09/06/18 0443 09/07/18 0617 09/08/18 0643  WBC 9.1 7.4 7.8  RBC 4.31 4.42 4.80  HGB 12.9 13.2 14.6  HCT 40.7 41.4 45.0  MCV 94.4 93.7 93.8  MCH 29.9 29.9 30.4  MCHC 31.7 31.9 32.4  RDW 13.8 13.7 13.2  PLT 220 243 282    Cardiac EnzymesNo results for input(s): TROPONINI in the last 168 hours. No results for input(s): TROPIPOC in the last 168 hours.   BNP Recent Labs  Lab 09/03/18 2256 09/06/18 0443  BNP 287.0* 555.0*     DDimer No results for input(s): DDIMER in the last 168 hours.   Radiology    No results found.  Cardiac Studies   Echocardiogram: 09/04/2018 IMPRESSIONS    1. The left ventricle has  mild-moderately reduced systolic function, with an ejection fraction of 40-45%. The cavity size was normal. There is mildly increased left ventricular wall thickness. Left ventricular diastolic Doppler parameters are consistent  with impaired relaxation. Elevated mean left atrial pressure.  2. The LV apex is hypokinetic.  3. The right ventricle has normal systolic function. The cavity was normal. There is no increase in right ventricular wall thickness.  4. Left atrial size was severely dilated.  5. Right atrial size was mildly dilated.  6. The aortic valve is tricuspid. Aortic valve regurgitation is mild by color flow Doppler. Mild-moderate stenosis of the aortic valve. Severe aortic annular calcification noted.  7. The mitral valve is abnormal. Moderate thickening of the mitral valve leaflet. Moderate calcification of the mitral valve leaflet. There is severe mitral annular calcification present. Moderate-severe mitral valve stenosis.  8. The aorta is normal in size and structure.  9. The aortic root is normal in size and structure. 10. Pulmonary hypertension is indeterminate, inadequate TR jet. 11. The apex appears to be newly hypokinetic since 10/23/2016 study with decrease in LVEF. There is some limited visualizaiton in the apical views, would recommend a limited contrast study to verify this new findings prior to pursuing additional workup.  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  Limited Echo: 09/07/2018 IMPRESSIONS    1. Stage 1: stage1: Apical anterior segment and apex are abnormal.  2. The left ventricle has normal systolic function, with an ejection fraction of 55-60% (contrast employed). The cavity size was normal. There is moderate concentric left ventricular hypertrophy. Left ventricular diastolic Doppler parameters are  consistent with impaired relaxation.  3. The right ventricle has normal systolc function. The cavity was normal. There is no  increase in right ventricular wall thickness.  4. The aortic valve is tricuspid Mild thickening of the aortic valve Mild calcification of the aortic valve. Aortic valve regurgitation was not assessed by color flow Doppler.. Moderate aortic annular calcification noted.  5. The mitral valve is degenerative. Moderate thickening of the mitral valve leaflet. There is severe mitral annular calcification present.  6. The aortic root is normal in size and structure.  7. Left atrial size was severely dilated.  8. Right atrial size was mildly dilated.  9. The interatrial septum was not well visualized. 10. The tricuspid valve was grossly normal. Tricuspid valve regurgitation was not assessed by color flow Doppler.   Patient Profile     78 y.o. 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   Assessment & Plan   1. Acute on Chronic Diastolic CHF Exacerbation - BNP elevated to 287 on admission and weight elevated to 241 lbs (previously 235 lbs in 12/2017). Echo initially showed an EF of 40-45% with a hypokinetic apex but views were poor. Repeat limited study obtained yesterday which showed EF was normal at 55-60%. She has been receiving intermittent doses of IV Lasix this admission and received IV Lasix 20mg  yesterday morning along with receiving PO Lasix 20mg  BID. Weight has declined to 238 lbs as of 8/3 (weight not yet obtained this AM). Overall net output of -3.4L thus far. Would continue with PO Lasix 20mg  BID as taking prior to admission. Reviewed the importance of following daily weights and limiting sodium intake.   2. Valvular Heart Disease - echo this admission showed mild to moderate AS, moderate to severe mitral stenosis, and mild TR. She will eventually require referral  to the Structural Heart Team along with [facility]. She is very clear in her decision to go home today and we reviewed further work-up could be arranged as an outpatient. Unclear if she would be a surgical candidate as she is not active at baseline and the most active thing she does on a daily basis is ambulate around her home with a walker. She no longer walks outside unless going to an appointment.    3. 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 Ladona Ridgel as an outpatient.   No further inpatient workup planned. Will make sure hospital follow-up has been arranged.   For questions or updates, please contact [facility] Please consult [email or web address] for contact info under Cardiology/STEMI.   Lorri Frederick , PA-C 8:54 AM 09/08/2018 Pager: [phone]  The patient was seen and examined, and I agree with the history, physical exam, assessment and plan as documented above, with modifications as noted below.  She is anxious to go home.  She is euvolemic.  I spoke with the structural heart team.  I will consider an outpatient work-up with right and left heart catheterization and TEE with subsequent referral for valve replacement surgery with Dr. Cornelius Moras.  That being said, given her poor overall conditioning I am not certain she would be an optimal surgical candidate.  She certainly is not a candidate for percutaneous mitral balloon valvuloplasty given severe mitral annular calcification.  Limited echocardiogram demonstrated normal left ventricular systolic function and this was performed with contrast.  There was some degree of very mild left ventricular apical and anteroapical hypokinesis.  She is stable for discharge from my standpoint.  Prentice Docker, MD, [facility]  09/08/2018 9:46 AM

## 2018-09-08 NOTE — Progress Notes (Signed)
SATURATION QUALIFICATIONS: (This note is used to comply with regulatory documentation for home oxygen)  Patient Saturations on Room Air at Rest = 98%  Patient Saturations on Room Air while Ambulating = 82%  Patient Saturations on 2 Liters of oxygen while Ambulating = 98%  Please briefly explain why patient needs home oxygen:

## 2018-09-08 NOTE — Progress Notes (Signed)
Physical Therapy Note  Patient Details  Name: Rebecca [name] MRN: [id number] Date of Birth: 02-09-1940 Today's Date: 09/08/2018    Pt refused treatment today stating she is being discharged.   Teena Irani, PTA/CLT [phone]    Roseanne Reno B 09/08/2018, 3:42 PM

## 2018-09-08 NOTE — Discharge Summary (Addendum)
Physician Discharge Summary  Rebecca [name] MPN:[id number] DOB: [birth year] DOA: 09/03/2018  PCP: Lucia Gaskins, MD  Admit date: 09/03/2018 Discharge date: 09/08/2018  Admitted From: Home Disposition:  Home   Recommendations for Outpatient Follow-up:  1. Follow up with PCP in 1-2 weeks 2. Please obtain BMP/CBC in one week 3. Please follow up on the following pending results:  Home Health: HHPT,RN Equipment/Devices: 2 L [place]  Discharge Condition: Stable CODE STATUS: FULL Diet recommendation: Heart Healthy / Carb Modified    Brief/Interim Summary: 78 year old female with a history of diastolic CHF, aortic stenosis, mitral stenosis, CKD stage III, hypertension, recurrent lower extremity DVT, diabetes mellitus, complete heart block status post permanent pacemaker presenting with 1 to 2-day history of shortness of breath and coughing. Unfortunately, the patient is a poor historian. All of this history is obtained from review the medical record and speaking with the patient's son. Upon presentation, the patient was noted to have some mild extremis with oxygen saturation 81%. She was placed on BiPAP. Initial chest x-ray showed bilateral opacities with increased interstitial markings. WBC was 16.2 with BNP 287. Patient was started on IV furosemide and levofloxacin for treatment of fluid overload and pneumonia.  Discharge Diagnoses:   Acute respiratory failure with hypoxia -Secondary to fluid overload and pneumonia -Initially presented requiring BiPAP -Presently stable on 2 L nasal cannula -Wean oxygen as tolerated for saturation greater 92% -pt will go home with 2L as she desaturated with ambulation  Lobar pneumonia -731 CT chest--patchy bilateral streaky opacities with mosaic attenuation. Small bilateral pleural effusions. Cannot rule out superimposed infection particularly in LLL -Procalcitonin peaked at 0.46 -Continue levofloxacin due to penicillin allergy-finished 5  days  Acute on chronic diastolic CHF -0/09/6759 echo EF 40-45%, HK LV apex,severe LAE, moderate to severeMS,mild to moderateAS -8/3-repeat limited echo EF 55-60% -Continue intravenous furosemide -Daily weights -12/15/2017 office weight 235 pounds -09/03/2018 admissionweight290?? -09/07/18 weight 238.7 lbs -Accurate I's and O's--NEG 3-4 L for the admission -Continue carvedilol -appreciate cardiology consult  Uncontrolled diabetes mellitus type 2 with hyperglycemia -Hemoglobin A1c 9.2 -Suspect a degree of dietary indiscretion at home as the patient CBGs are well controlled during the hospitalization -Continue Lantus 20 units twice daily -Continue NovoLog sliding scale -CBGs controlled during the hospitalization  CKD stage III -Baseline creatinine 1.0-1.2 -Monitor clinically with diuresis  Complete heart block -Status post PPM -personally reviewed EKG--paced rhythm  Hyponatremia -Secondary to fluid overload -Improving with furosemide  Recurrent DVT -Continue warfarin  Depression -continue Cymbalta   Discharge Instructions   Allergies as of 09/08/2018      Reactions   Codeine Anxiety   Compazine Anaphylaxis   Other Anaphylaxis, Rash, Other (See Comments)   Uncoded Allergy. Allergen: INOVAR Uncoded Allergy. Allergen: ALL ADHESIVES Uncoded Allergy. Allergen: SILK SUTURE Uncoded Allergy. Allergen: merthiolate Thermasol   Penicillins Shortness Of Breath, Rash   Has patient had a PCN reaction causing immediate rash, facial/tongue/throat swelling, SOB or lightheadedness with hypotension: Yes Has patient had a PCN reaction causing severe rash involving mucus membranes or skin necrosis: No Has patient had a PCN reaction that required hospitalization No Has patient had a PCN reaction occurring within the last 10 years: Yes If all of the above answers are "NO", then may proceed with Cephalosporin use.   Morphine And Related Other (See Comments)   Patient states  that it makes her lose her state of mind.    Demerol [meperidine] Rash      Medication List    STOP taking these medications  celecoxib 200 MG capsule Commonly known as: CELEBREX     TAKE these medications   Bydureon 2 MG Pen Generic drug: Exenatide ER Inject 2 mg into the skin once a week.   carvedilol 3.125 MG tablet Commonly known as: COREG TAKE 1 TABLET BY MOUTH TWICE DAILY WITH A MEAL   clomiPRAMINE 25 MG capsule Commonly known as: ANAFRANIL Take 25 mg by mouth 4 (four) times daily.   clonazePAM 1 MG tablet Commonly known as: KLONOPIN Take 1 mg by mouth at bedtime.   DULoxetine 60 MG capsule Commonly known as: CYMBALTA Take 60 mg by mouth daily.   ferrous sulfate 325 (65 FE) MG tablet Take 325 mg by mouth daily with breakfast.   furosemide 40 MG tablet Commonly known as: LASIX Take 20 mg by mouth 2 (two) times daily.   gabapentin 300 MG capsule Commonly known as: NEURONTIN Take 300 mg by mouth 2 (two) times daily.   insulin glargine 100 unit/mL Sopn Commonly known as: LANTUS Inject 20 Units into the skin 2 (two) times daily.   metFORMIN 500 MG tablet Commonly known as: GLUCOPHAGE Take 500 mg by mouth 2 (two) times daily with a meal.   mirtazapine 15 MG tablet Commonly known as: REMERON Take 15 mg by mouth at bedtime.   multivitamin with minerals Tabs tablet Take 1 tablet by mouth daily.   Oxycodone HCl 10 MG Tabs Take 10 mg by mouth. Every 4 to 6 hours   potassium chloride 10 MEQ tablet Commonly known as: K-DUR Take 1 tablet (10 mEq total) by mouth daily.   QUEtiapine 100 MG tablet Commonly known as: SEROQUEL Take 100 mg by mouth at bedtime.   vitamin C 500 MG tablet Commonly known as: ASCORBIC ACID Take 500 mg by mouth daily.   Vitamin D3 75 MCG (3000 UT) Tabs Take 1 capsule by mouth daily.   warfarin 5 MG tablet Commonly known as: COUMADIN Take 2.5-5 mg by mouth every evening. Starting 10/29/2016 take 2.5 mg alternating with 5 mg  daily            Durable Medical Equipment  (From admission, onward)         Start     Ordered   09/08/18 1138  For home use only DME oxygen  Once    Question Answer Comment  Length of Need Lifetime   Mode or (Route) Nasal cannula   Liters per Minute 2   Frequency Continuous (stationary and portable oxygen unit needed)   Oxygen conserving device Yes   Oxygen delivery system Gas      09/08/18 1137         Follow-up Information    Laqueta LindenKoneswaran, Suresh A, MD Follow up on 10/05/2018.   Specialty: Cardiology Why: Keep scheduled Cardiology Follow-up on 10/05/2018 at 1:00 PM.  Contact information: [address] [place] [phone] [phone]          Allergies  Allergen Reactions   Codeine Anxiety   Compazine Anaphylaxis   Other Anaphylaxis, Rash and Other (See Comments)    Uncoded Allergy. Allergen: INOVAR Uncoded Allergy. Allergen: ALL ADHESIVES Uncoded Allergy. Allergen: SILK SUTURE Uncoded Allergy. Allergen: merthiolate Thermasol   Penicillins Shortness Of Breath and Rash    Has patient had a PCN reaction causing immediate rash, facial/tongue/throat swelling, SOB or lightheadedness with hypotension: Yes Has patient had a PCN reaction causing severe rash involving mucus membranes or skin necrosis: No Has patient had a PCN reaction that required hospitalization No Has  patient had a PCN reaction occurring within the last 10 years: Yes If all of the above answers are "NO", then may proceed with Cephalosporin use.    Morphine And Related Other (See Comments)    Patient states that it makes her lose her state of mind.    Demerol [Meperidine] Rash    Consultations:  cardiology   Procedures/Studies: Ct Chest Wo Contrast  Result Date: 09/04/2018 CLINICAL DATA:  Shortness of breath EXAM: CT CHEST WITHOUT CONTRAST TECHNIQUE: Multidetector CT imaging of the chest was performed following the standard protocol without IV contrast. COMPARISON:  Chest x-ray  from yesterday FINDINGS: Cardiovascular: Cardiomegaly. Extensive aortic and coronary calcification. Mitral and aortic valve calcification with bulky mitral annular calcification. No pericardial effusion. Dual-chamber pacer leads into the right atrium and right ventricle. Mediastinum/Nodes: Negative for adenopathy. Thyroid thickening, likely goiter but limited by streak artifact from battery pack and arms. Moderate sliding hiatal hernia. Lungs/Pleura: Patchy streaky opacity with mosaic attenuation. Small pleural effusions. Patient has negative COVID-19 test. Upper Abdomen: Cholecystectomy and atherosclerosis Musculoskeletal: Remote L1 superior endplate fracture. Advanced lower thoracic and upper lumbar degenerative disease. Advanced glenohumeral degenerative disease. IMPRESSION: 1. Patchy bilateral atelectasis with areas of air trapping. There could be superimposed infection, especially in the left lower lobe. 2. Cardiomegaly without pulmonary edema. 3. Moderate hiatal hernia. Electronically Signed   By: Marnee Spring M.D.   On: 09/04/2018 06:04   Dg Chest Port 1 View  Result Date: 09/03/2018 CLINICAL DATA:  Shortness of breath EXAM: PORTABLE CHEST 1 VIEW COMPARISON:  March 29, 2017 FINDINGS: Again noted is cardiomegaly. There is hazy airspace opacities seen throughout both lungs, right worse than left. No focal large airspace consolidation. No pleural effusion. IMPRESSION: 1. Hazy airspace opacities within both lungs, right greater than left. This could be due to pulmonary edema versus atypical infection, such as viral pneumonia. 2. Cardiomegaly Electronically Signed   By: Jonna Clark M.D.   On: 09/03/2018 23:46         Discharge Exam: Vitals:   09/07/18 1841 09/07/18 2102  BP:  140/86  Pulse: 73 71  Resp:  18  Temp:  (!) 97.4 F (36.3 C)  SpO2:  100%   Vitals:   09/07/18 0817 09/07/18 1500 09/07/18 1841 09/07/18 2102  BP:  109/68  140/86  Pulse:   73 71  Resp:  18  18  Temp:  98.1  F (36.7 C)  (!) 97.4 F (36.3 C)  TempSrc:  Oral  Oral  SpO2: 99% 97%  100%  Weight:      Height:        General: Pt is alert, awake, not in acute distress Cardiovascular: RRR, S1/S2 +, no rubs, no gallops Respiratory: bibasilar crackles. No wheeze Abdominal: Soft, NT, ND, bowel sounds + Extremities: no edema, no cyanosis   The results of significant diagnostics from this hospitalization (including imaging, microbiology, ancillary and laboratory) are listed below for reference.    Significant Diagnostic Studies: Ct Chest Wo Contrast  Result Date: 09/04/2018 CLINICAL DATA:  Shortness of breath EXAM: CT CHEST WITHOUT CONTRAST TECHNIQUE: Multidetector CT imaging of the chest was performed following the standard protocol without IV contrast. COMPARISON:  Chest x-ray from yesterday FINDINGS: Cardiovascular: Cardiomegaly. Extensive aortic and coronary calcification. Mitral and aortic valve calcification with bulky mitral annular calcification. No pericardial effusion. Dual-chamber pacer leads into the right atrium and right ventricle. Mediastinum/Nodes: Negative for adenopathy. Thyroid thickening, likely goiter but limited by streak artifact from battery pack and arms.  Moderate sliding hiatal hernia. Lungs/Pleura: Patchy streaky opacity with mosaic attenuation. Small pleural effusions. Patient has negative COVID-19 test. Upper Abdomen: Cholecystectomy and atherosclerosis Musculoskeletal: Remote L1 superior endplate fracture. Advanced lower thoracic and upper lumbar degenerative disease. Advanced glenohumeral degenerative disease. IMPRESSION: 1. Patchy bilateral atelectasis with areas of air trapping. There could be superimposed infection, especially in the left lower lobe. 2. Cardiomegaly without pulmonary edema. 3. Moderate hiatal hernia. Electronically Signed   By: Marnee SpringJonathon  Watts M.D.   On: 09/04/2018 06:04   Dg Chest Port 1 View  Result Date: 09/03/2018 CLINICAL DATA:  Shortness of breath  EXAM: PORTABLE CHEST 1 VIEW COMPARISON:  March 29, 2017 FINDINGS: Again noted is cardiomegaly. There is hazy airspace opacities seen throughout both lungs, right worse than left. No focal large airspace consolidation. No pleural effusion. IMPRESSION: 1. Hazy airspace opacities within both lungs, right greater than left. This could be due to pulmonary edema versus atypical infection, such as viral pneumonia. 2. Cardiomegaly Electronically Signed   By: Jonna ClarkBindu  Avutu M.D.   On: 09/03/2018 23:46     Microbiology: Recent Results (from the past 240 hour(s))  SARS Coronavirus 2 (CEPHEID- [facility]), Hosp Order     Status: None   Collection Time: 09/03/18 10:56 PM   Specimen: Nasopharyngeal Swab  Result Value Ref Range Status   SARS Coronavirus 2 NEGATIVE NEGATIVE Final    Comment: (NOTE) If result is NEGATIVE SARS-CoV-2 target nucleic acids are NOT DETECTED. The SARS-CoV-2 RNA is generally detectable in upper and lower  respiratory specimens during the acute phase of infection. The lowest  concentration of SARS-CoV-2 viral copies this assay can detect is 250  copies / mL. A negative result does not preclude SARS-CoV-2 infection  and should not be used as the sole basis for treatment or other  patient management decisions.  A negative result may occur with  improper specimen collection / handling, submission of specimen other  than nasopharyngeal swab, presence of viral mutation(s) within the  areas targeted by this assay, and inadequate number of viral copies  (<250 copies / mL). A negative result must be combined with clinical  observations, patient history, and epidemiological information. If result is POSITIVE SARS-CoV-2 target nucleic acids are DETECTED. The SARS-CoV-2 RNA is generally detectable in upper and lower  respiratory specimens dur ing the acute phase of infection.  Positive  results are indicative of active infection with SARS-CoV-2.  Clinical   correlation with patient history and other diagnostic information is  necessary to determine patient infection status.  Positive results do  not rule out bacterial infection or co-infection with other viruses. If result is PRESUMPTIVE POSTIVE SARS-CoV-2 nucleic acids MAY BE PRESENT.   A presumptive positive result was obtained on the submitted specimen  and confirmed on repeat testing.  While 2019 novel coronavirus  (SARS-CoV-2) nucleic acids may be present in the submitted sample  additional confirmatory testing may be necessary for epidemiological  and / or clinical management purposes  to differentiate between  SARS-CoV-2 and other Sarbecovirus currently known to infect humans.  If clinically indicated additional testing with an alternate test  methodology [phone]([id number]) is advised. The SARS-CoV-2 RNA is generally  detectable in upper and lower respiratory sp ecimens during the acute  phase of infection. The expected result is Negative. Fact Sheet for Patients:  BoilerBrush.com.cyhttps://www.fda.gov/media/136312/download Fact Sheet for Healthcare Providers: https://pope.com/https://www.fda.gov/media/136313/download This test is not yet approved or cleared by the [place] FDA and has been authorized for detection  and/or diagnosis of SARS-CoV-2 by FDA under an Emergency Use Authorization (EUA).  This EUA will remain in effect (meaning this test can be used) for the duration of the COVID-19 declaration under Section 564(b)(1) of the Act, 21 U.S.C. section 360bbb-3(b)(1), unless the authorization is terminated or revoked sooner. Performed at [facility], [address] [place], [place] [address]   Blood culture (routine x 2)     Status: None (Preliminary result)   Collection Time: 09/04/18  6:49 AM   Specimen: BLOOD RIGHT HAND  Result Value Ref Range Status   Specimen Description BLOOD RIGHT HAND  Final   Special Requests   Final    BOTTLES DRAWN AEROBIC AND ANAEROBIC Blood Culture results may not be optimal due  to an excessive volume of blood received in culture bottles   Culture   Final    NO GROWTH 4 DAYS Performed at [facility], [address] [place], [place] [address]    Report Status PENDING  Incomplete  Blood culture (routine x 2)     Status: None (Preliminary result)   Collection Time: 09/04/18  6:51 AM   Specimen: BLOOD LEFT HAND  Result Value Ref Range Status   Specimen Description BLOOD LEFT HAND  Final   Special Requests   Final    BOTTLES DRAWN AEROBIC AND ANAEROBIC Blood Culture adequate volume   Culture   Final    NO GROWTH 4 DAYS Performed at [facility], [address], [place], [place] [address]    Report Status PENDING  Incomplete  MRSA PCR Screening     Status: None   Collection Time: 09/04/18 10:55 AM   Specimen: Nasopharyngeal  Result Value Ref Range Status   MRSA by PCR NEGATIVE NEGATIVE Final    Comment:        The GeneXpert MRSA Assay (FDA approved for NASAL specimens only), is one component of a comprehensive MRSA colonization surveillance program. It is not intended to diagnose MRSA infection nor to guide or monitor treatment for MRSA infections. Performed at [facility], [address] [place], [place] [address]      Labs: Basic Metabolic Panel: Recent Labs  Lab 09/03/18 2256 09/04/18 0109 09/05/18 0416 09/06/18 0443 09/07/18 0617 09/08/18 0643  NA 132*  --  135 136 138 137  K 4.9  --  3.7 3.7 3.9 4.2  CL 96*  --  97* 97* 96* 97*  CO2 26  --  GLUCOSE 397*  --  128* 114* 96 107*  BUN 29*  --  30* 26* 25* 24*  CREATININE 1.27*  --  1.23* 1.00 0.94 0.88  CALCIUM 8.6*  --  8.5* 8.6* 8.6* 9.0  MG  --  2.1 2.0 2.1 2.2 2.3   Liver Function Tests: Recent Labs  Lab 09/03/18 2256  AST 55*  ALT 51*  ALKPHOS 74  BILITOT 0.9  PROT 7.9  ALBUMIN 4.0   No results for input(s): LIPASE, AMYLASE in the last 168 hours. No results for input(s): AMMONIA in the last 168 hours. CBC: Recent Labs  Lab 09/03/18 2256 09/05/18 0416  09/06/18 0443 09/07/18 0617 09/08/18 0643  WBC 16.2* 10.7* 9.1 7.4 7.8  NEUTROABS 14.3*  --   --   --   --   HGB 14.2 12.9 12.9 13.2 14.6  HCT 43.9 40.8 40.7 41.4 45.0  MCV 93.6 94.7 94.4 93.7 93.8  PLT 248 219 220 243 282   Cardiac Enzymes: No results for input(s):  CKTOTAL, CKMB, CKMBINDEX, TROPONINI in the last 168 hours. BNP: Invalid input(s): POCBNP CBG: Recent Labs  Lab 09/07/18 1158 09/07/18 1822 09/07/18 2105 09/08/18 0805 09/08/18 1145  GLUCAP 184* 153* 142* 101* 167*    Time coordinating discharge:  36 minutes  Signed:  Catarina Hartshornavid Janeisha Ryle, DO [facility] Pager: [phone] 09/08/2018, 1:52 PM

## 2018-09-08 NOTE — Progress Notes (Signed)
Nsg Discharge Note  Admit Date:  09/03/2018 Discharge date: 09/08/2018   Nancy Fetter [place] to be D/C'd Home per MD order.  AVS completed.  Copy for chart, and copy for patient signed, and dated. Patient/caregiver able to verbalize understanding.  Discharge Medication: Allergies as of 09/08/2018      Reactions   Codeine Anxiety   Compazine Anaphylaxis   Other Anaphylaxis, Rash, Other (See Comments)   Uncoded Allergy. Allergen: INOVAR Uncoded Allergy. Allergen: ALL ADHESIVES Uncoded Allergy. Allergen: SILK SUTURE Uncoded Allergy. Allergen: merthiolate Thermasol   Penicillins Shortness Of Breath, Rash   Has patient had a PCN reaction causing immediate rash, facial/tongue/throat swelling, SOB or lightheadedness with hypotension: Yes Has patient had a PCN reaction causing severe rash involving mucus membranes or skin necrosis: No Has patient had a PCN reaction that required hospitalization No Has patient had a PCN reaction occurring within the last 10 years: Yes If all of the above answers are "NO", then may proceed with Cephalosporin use.   Morphine And Related Other (See Comments)   Patient states that it makes her lose her state of mind.    Demerol [meperidine] Rash      Medication List    STOP taking these medications   celecoxib 200 MG capsule Commonly known as: CELEBREX     TAKE these medications   Bydureon 2 MG Pen Generic drug: Exenatide ER Inject 2 mg into the skin once a week.   carvedilol 3.125 MG tablet Commonly known as: COREG TAKE 1 TABLET BY MOUTH TWICE DAILY WITH A MEAL   clomiPRAMINE 25 MG capsule Commonly known as: ANAFRANIL Take 25 mg by mouth 4 (four) times daily.   clonazePAM 1 MG tablet Commonly known as: KLONOPIN Take 1 mg by mouth at bedtime.   DULoxetine 60 MG capsule Commonly known as: CYMBALTA Take 60 mg by mouth daily.   ferrous sulfate 325 (65 FE) MG tablet Take 325 mg by mouth daily with breakfast.   furosemide 40 MG tablet Commonly known  as: LASIX Take 20 mg by mouth 2 (two) times daily.   gabapentin 300 MG capsule Commonly known as: NEURONTIN Take 300 mg by mouth 2 (two) times daily.   insulin glargine 100 unit/mL Sopn Commonly known as: LANTUS Inject 20 Units into the skin 2 (two) times daily.   metFORMIN 500 MG tablet Commonly known as: GLUCOPHAGE Take 500 mg by mouth 2 (two) times daily with a meal.   mirtazapine 15 MG tablet Commonly known as: REMERON Take 15 mg by mouth at bedtime.   multivitamin with minerals Tabs tablet Take 1 tablet by mouth daily.   Oxycodone HCl 10 MG Tabs Take 10 mg by mouth. Every 4 to 6 hours   potassium chloride 10 MEQ tablet Commonly known as: K-DUR Take 1 tablet (10 mEq total) by mouth daily.   QUEtiapine 100 MG tablet Commonly known as: SEROQUEL Take 100 mg by mouth at bedtime.   vitamin C 500 MG tablet Commonly known as: ASCORBIC ACID Take 500 mg by mouth daily.   Vitamin D3 75 MCG (3000 UT) Tabs Take 1 capsule by mouth daily.   warfarin 5 MG tablet Commonly known as: COUMADIN Take 2.5-5 mg by mouth every evening. Starting 10/29/2016 take 2.5 mg alternating with 5 mg daily            Durable Medical Equipment  (From admission, onward)         Start     Ordered   09/08/18 1138  For home use only DME oxygen  Once    Question Answer Comment  Length of Need Lifetime   Mode or (Route) Nasal cannula   Liters per Minute 2   Frequency Continuous (stationary and portable oxygen unit needed)   Oxygen conserving device Yes   Oxygen delivery system Gas      09/08/18 1137          Discharge Assessment: Vitals:   09/07/18 1841 09/07/18 2102  BP:  140/86  Pulse: 73 71  Resp:  18  Temp:  (!) 97.4 F (36.3 C)  SpO2:  100%   Skin clean, dry and intact without evidence of skin break down, no evidence of skin tears noted. IV catheter discontinued intact. Site without signs and symptoms of complications - no redness or edema noted at insertion site, patient  denies c/o pain - only slight tenderness at site.  Dressing with slight pressure applied.  D/c Instructions-Education: Discharge instructions given to patient/family with verbalized understanding. D/c education completed with patient/family including follow up instructions, medication list, d/c activities limitations if indicated, with other d/c instructions as indicated by MD - patient able to verbalize understanding, all questions fully answered. Patient instructed to return to ED, call 911, or call MD for any changes in condition.  Patient escorted via [place], and D/C home via private auto.  Loa Socks, RN 09/08/2018 2:44 PM

## 2018-09-08 NOTE — Progress Notes (Signed)
ANTICOAGULATION CONSULT NOTE - Pharmacy Consult for warfarin Indication: recurrent DVT  Allergies  Allergen Reactions  . Codeine Anxiety  . Compazine Anaphylaxis  . Other Anaphylaxis, Rash and Other (See Comments)    Uncoded Allergy. Allergen: INOVAR Uncoded Allergy. Allergen: ALL ADHESIVES Uncoded Allergy. Allergen: SILK SUTURE Uncoded Allergy. Allergen: merthiolate Thermasol  . Penicillins Shortness Of Breath and Rash    Has patient had a PCN reaction causing immediate rash, facial/tongue/throat swelling, SOB or lightheadedness with hypotension: Yes Has patient had a PCN reaction causing severe rash involving mucus membranes or skin necrosis: No Has patient had a PCN reaction that required hospitalization No Has patient had a PCN reaction occurring within the last 10 years: Yes If all of the above answers are "NO", then may proceed with Cephalosporin use.   Marland Kitchen Morphine And Related Other (See Comments)    Patient states that it makes her lose her state of mind.   . Demerol [Meperidine] Rash    Patient Measurements: Height: 5\' 8"  (172.7 cm) Weight: 238 lb 12.1 oz (108.3 kg) IBW/kg (Calculated) : 63.9   Vital Signs:    Labs: Recent Labs    09/06/18 0443 09/07/18 0617 09/08/18 0643  HGB 12.9 13.2 14.6  HCT 40.7 41.4 45.0  PLT 220 243 282  LABPROT 24.1* 24.0* 22.4*  INR 2.2* 2.2* 2.0*  CREATININE 1.00 0.94 0.88    Estimated Creatinine Clearance: 68 mL/min (by C-G formula based on SCr of 0.88 mg/dL).   Medical History: Past Medical History:  Diagnosis Date  . Aortic stenosis   . Cataplexy   . Diastolic heart failure ([place])   . Essential hypertension   . GERD (gastroesophageal reflux disease)   . Hiatal hernia   . History of recurrent deep vein thrombosis (DVT)   . Mitral stenosis   . Narcolepsy   . Neuropathy   . Renal disorder   . Type 2 diabetes mellitus ([facility])     Medications:  Medications Prior to Admission  Medication Sig Dispense Refill Last Dose   . BYDUREON 2 MG PEN Inject 2 mg into the skin once a week.  5 Past Week at Unknown time  . carvedilol (COREG) 3.125 MG tablet TAKE 1 TABLET BY MOUTH TWICE DAILY WITH A MEAL 60 tablet 6 09/03/2018 at 1800  . celecoxib (CELEBREX) 200 MG capsule Take 200 mg by mouth daily. For arthritis   09/03/2018 at Unknown time  . Cholecalciferol (VITAMIN D3) 3000 UNITS TABS Take 1 capsule by mouth daily.   09/03/2018 at Unknown time  . clomiPRAMINE (ANAFRANIL) 25 MG capsule Take 25 mg by mouth 4 (four) times daily.    09/03/2018 at Unknown time  . clonazePAM (KLONOPIN) 1 MG tablet Take 1 mg by mouth at bedtime.    09/03/2018 at Unknown time  . DULoxetine (CYMBALTA) 60 MG capsule Take 60 mg by mouth daily.     09/03/2018 at Unknown time  . ferrous sulfate 325 (65 FE) MG tablet Take 325 mg by mouth daily with breakfast.   09/03/2018 at Unknown time  . furosemide (LASIX) 40 MG tablet Take 20 mg by mouth 2 (two) times daily.   5 09/03/2018 at Unknown time  . gabapentin (NEURONTIN) 300 MG capsule Take 300 mg by mouth 2 (two) times daily.   09/03/2018 at Unknown time  . insulin glargine (LANTUS) 100 unit/mL SOPN Inject 20 Units into the skin 2 (two) times daily.    09/03/2018 at Unknown time  . metFORMIN (GLUCOPHAGE) 500 MG tablet Take  500 mg by mouth 2 (two) times daily with a meal.   Past Week at Unknown time  . mirtazapine (REMERON) 15 MG tablet Take 15 mg by mouth at bedtime.     Past Week at Unknown time  . Multiple Vitamin (MULTIVITAMIN WITH MINERALS) TABS tablet Take 1 tablet by mouth daily.   09/03/2018 at Unknown time  . Oxycodone HCl 10 MG TABS Take 10 mg by mouth. Every 4 to 6 hours   09/03/2018 at Unknown time  . potassium chloride (K-DUR) 10 MEQ tablet Take 1 tablet (10 mEq total) by mouth daily. 90 tablet 3 09/03/2018 at Unknown time  . QUEtiapine (SEROQUEL) 100 MG tablet Take 100 mg by mouth at bedtime.   Past Week at Unknown time  . vitamin C (ASCORBIC ACID) 500 MG tablet Take 500 mg by mouth daily.   09/03/2018 at  Unknown time  . warfarin (COUMADIN) 5 MG tablet Take 2.5-5 mg by mouth every evening. Starting 10/29/2016 take 2.5 mg alternating with 5 mg daily    09/03/2018 at 1900    Assessment: Pharmacy consulted to dose warfarin in patient with recurrent DVT's.  Patient INR therapeutic at 2.0.  Patient's warfarin dose alternates 2.5 and 5 mg every other day.  Goal of Therapy:  INR 2-3 Monitor platelets by anticoagulation protocol: Yes   Plan:  Warfarin 5 mg x 1 dose. Increased dose today due to trending towards subtherapeutic  Monitor labs, INR, and s/s of bleeding.  Salvatore Decent Elisabel Hanover 09/08/2018,10:07 AM

## 2018-09-08 NOTE — TOC Transition Note (Signed)
Transition of Care [facility]) - CM/SW Discharge Note   Patient Details  Name: Rebecca [name] MRN: [id number] Date of Birth: July 10, 1940  Transition of Care [facility]) CM/SW Contact:  Minh Roanhorse, Chauncey Reading, RN Phone Number: 09/08/2018, 11:38 AM   Clinical Narrative:   Patient qualifies for home oxygen. Discussed with patient and son. They elect [employer]. Portable tank will be delivered to room prior to DC. Vaughan Basta of [facility] aware of DC today.    Final next level of care: Home/Self Care Barriers to Discharge: No Barriers Identified                  DME Arranged: Oxygen DME Agency: [facility] Date DME Agency Contacted: 09/08/18 Time DME Agency Contacted: [phone]   [place] Agency: [place] ([place]) Date HH Agency Contacted: 09/07/18 Time [place]: [phone] Representative spoke with at [place]: Vaughan Basta

## 2018-09-09 ENCOUNTER — Other Ambulatory Visit: Payer: Self-pay

## 2018-09-09 LAB — CULTURE, BLOOD (ROUTINE X 2)
Culture: NO GROWTH
Culture: NO GROWTH
Special Requests: ADEQUATE

## 2018-09-09 MED ORDER — CARVEDILOL 3.125 MG PO TABS: ORAL_TABLET | ORAL | 6 refills | Status: DC

## 2018-09-09 NOTE — Telephone Encounter (Signed)
Medication refill

## 2018-09-15 ENCOUNTER — Telehealth: Payer: Self-pay | Admitting: Cardiovascular Disease

## 2018-09-15 ENCOUNTER — Other Ambulatory Visit: Payer: Self-pay

## 2018-09-15 ENCOUNTER — Encounter: Payer: Self-pay | Admitting: Cardiovascular Disease

## 2018-09-15 ENCOUNTER — Telehealth (INDEPENDENT_AMBULATORY_CARE_PROVIDER_SITE_OTHER): Payer: Medicare Other | Admitting: Cardiovascular Disease

## 2018-09-15 DIAGNOSIS — I5032 Chronic diastolic (congestive) heart failure: Secondary | ICD-10-CM

## 2018-09-15 DIAGNOSIS — Z950 Presence of cardiac pacemaker: Secondary | ICD-10-CM

## 2018-09-15 DIAGNOSIS — I350 Nonrheumatic aortic (valve) stenosis: Secondary | ICD-10-CM

## 2018-09-15 DIAGNOSIS — I10 Essential (primary) hypertension: Secondary | ICD-10-CM

## 2018-09-15 DIAGNOSIS — Z86718 Personal history of other venous thrombosis and embolism: Secondary | ICD-10-CM

## 2018-09-15 DIAGNOSIS — Z7901 Long term (current) use of anticoagulants: Secondary | ICD-10-CM

## 2018-09-15 DIAGNOSIS — I38 Endocarditis, valve unspecified: Secondary | ICD-10-CM

## 2018-09-15 DIAGNOSIS — I050 Rheumatic mitral stenosis: Secondary | ICD-10-CM

## 2018-09-15 NOTE — Progress Notes (Signed)
Virtual Visit via Telephone Note   This visit type was conducted due to national recommendations for restrictions regarding the COVID-19 Pandemic (e.g. social distancing) in an effort to limit this patient's exposure and mitigate transmission in our community.  Due to her co-morbid illnesses, this patient is at least at moderate risk for complications without adequate follow up.  This format is felt to be most appropriate for this patient at this time.  The patient did not have access to video technology/had technical difficulties with video requiring transitioning to audio format only (telephone).  All issues noted in this document were discussed and addressed.  No physical exam could be performed with this format.  Please refer to the patient's chart for her  consent to telehealth for [facility].   Date:  09/15/2018   ID:  Rebecca [name], DOB [birth year], MRN [id number]  Patient Location: Home Provider Location: Office  PCP:  Lucia Gaskins, MD  Cardiologist:  Kate Sable, MD  Electrophysiologist:  None   Evaluation Performed:  Follow-Up Visit  Chief Complaint: Chronic diastolic heart failure and valvular heart disease  History of Present Illness:    KIMMY Rebecca [name] is a 78 y.o. female with chronic diastolic heart failure, pacemaker, recurrent DVT, and valvular heart disease.  She was recently hospitalized for decompensated diastolic heart failure.  She is feeling well today and denies chest pain, leg swelling, lightheadedness, dizziness, and shortness of breath.  She said her appetite is good.  Her son lives with her and she has a very attentive granddaughter who lives next door and visits with her daily.  The patient does not have symptoms concerning for COVID-19 infection (fever, chills, cough, or new shortness of breath).    Past Medical History:  Diagnosis Date  . Aortic stenosis   . Cataplexy   . Diastolic heart failure ([place])   . Essential hypertension   .  GERD (gastroesophageal reflux disease)   . Hiatal hernia   . History of recurrent deep vein thrombosis (DVT)   . Mitral stenosis   . Narcolepsy   . Neuropathy   . Renal disorder   . Type 2 diabetes mellitus ([place])    Past Surgical History:  Procedure Laterality Date  . ABDOMINAL HYSTERECTOMY    . CARPAL TUNNEL RELEASE    . CARPECTOMY  12/26/2011   Procedure: CARPECTOMY;  Surgeon: Cammie Sickle., MD;  Location: [place];  Service: Orthopedics;  Laterality: Right;  PROXIMAL ROW CARPECTOMY RIGHT WRIST and neurectomy  . CHOLECYSTECTOMY    . HEMORROIDECTOMY    . KIDNEY SURGERY    . NEPHROSTOMY    . PACEMAKER IMPLANT N/A 10/25/2016   Procedure: Pacemaker Implant;  Surgeon: Constance Haw, MD;  Location: [place] [facility];  Service: Cardiovascular;  Laterality: N/A;  . TOTAL HIP ARTHROPLASTY     bilateral     Current Meds  Medication Sig  . BYDUREON 2 MG PEN Inject 2 mg into the skin once a week.  . carvedilol (COREG) 3.125 MG tablet TAKE 1 TABLET BY MOUTH TWICE DAILY WITH A MEAL  . Cholecalciferol (VITAMIN D3) 3000 UNITS TABS Take 1 capsule by mouth daily.  . clomiPRAMINE (ANAFRANIL) 25 MG capsule Take 25 mg by mouth 4 (four) times daily.   . clonazePAM (KLONOPIN) 1 MG tablet Take 1 mg by mouth at bedtime.   . DULoxetine (CYMBALTA) 60 MG capsule Take 60 mg by mouth daily.    . ferrous sulfate 325 (65 FE)  MG tablet Take 325 mg by mouth daily with breakfast.  . furosemide (LASIX) 40 MG tablet Take 20 mg by mouth 2 (two) times daily.   Marland Kitchen gabapentin (NEURONTIN) 300 MG capsule Take 300 mg by mouth 2 (two) times daily.  . insulin glargine (LANTUS) 100 unit/mL SOPN Inject 20 Units into the skin 2 (two) times daily.   . metFORMIN (GLUCOPHAGE) 500 MG tablet Take 500 mg by mouth 2 (two) times daily with a meal.  . mirtazapine (REMERON) 15 MG tablet Take 15 mg by mouth at bedtime.    . Multiple Vitamin (MULTIVITAMIN WITH MINERALS) TABS tablet Take 1 tablet by mouth  daily.  . Oxycodone HCl 10 MG TABS Take 10 mg by mouth. Every 4 to 6 hours  . potassium chloride (K-DUR) 10 MEQ tablet Take 1 tablet (10 mEq total) by mouth daily.  . QUEtiapine (SEROQUEL) 100 MG tablet Take 100 mg by mouth at bedtime.  . vitamin C (ASCORBIC ACID) 500 MG tablet Take 500 mg by mouth daily.  Marland Kitchen warfarin (COUMADIN) 5 MG tablet Take 2.5-5 mg by mouth every evening. Starting 10/29/2016 take 2.5 mg alternating with 5 mg daily      Allergies:   Codeine, Compazine, Other, Penicillins, Morphine and related, and Demerol [meperidine]   Social History   Tobacco Use  . Smoking status: Former Smoker    Quit date: 10/13/1988    Years since quitting: 29.9  . Smokeless tobacco: Never Used  Substance Use Topics  . Alcohol use: No    Alcohol/week: 0.0 standard drinks  . Drug use: No     Family Hx: The patient's family history includes Diabetes in an other family member; Heart disease in her father; Scleroderma in her brother; Stroke in her mother.  ROS:   Please see the history of present illness.     All other systems reviewed and are negative.   Prior CV studies:   The following studies were reviewed today:  Echocardiogram with contrast 09/07/2018:  1. Stage 1: stage1: Apical anterior segment and apex are abnormal.  2. The left ventricle has normal systolic function, with an ejection fraction of 55-60% (contrast employed). The cavity size was normal. There is moderate concentric left ventricular hypertrophy. Left ventricular diastolic Doppler parameters are  consistent with impaired relaxation.  3. The right ventricle has normal systolc function. The cavity was normal. There is no increase in right ventricular wall thickness.  4. The aortic valve is tricuspid Mild thickening of the aortic valve Mild calcification of the aortic valve. Aortic valve regurgitation was not assessed by color flow Doppler.. Moderate aortic annular calcification noted.  5. The mitral valve is  degenerative. Moderate thickening of the mitral valve leaflet. There is severe mitral annular calcification present.  6. The aortic root is normal in size and structure.  7. Left atrial size was severely dilated.  8. Right atrial size was mildly dilated.  9. The interatrial septum was not well visualized. 10. The tricuspid valve was grossly normal. Tricuspid valve regurgitation was not assessed by color flow Doppler.  Labs/Other Tests and Data Reviewed:    EKG:  No ECG reviewed.  Recent Labs: 09/03/2018: ALT 51 09/04/2018: TSH 1.052 09/06/2018: B Natriuretic Peptide 555.0 09/08/2018: BUN 24; Creatinine, Ser 0.88; Hemoglobin 14.6; Magnesium 2.3; Platelets 282; Potassium 4.2; Sodium 137   Recent Lipid Panel No results found for: CHOL, TRIG, HDL, CHOLHDL, LDLCALC, LDLDIRECT  Wt Readings from Last 3 Encounters:  09/07/18 238 lb 12.1 oz (108.3 kg)  03/02/18 220 lb (99.8 kg)  12/15/17 235 lb (106.6 kg)     Objective:    Vital Signs:  There were no vitals taken for this visit.    ASSESSMENT & PLAN:    1.  Chronic diastolic heart failure Recently hospitalized for acute on chronic diastolic heart failure.  Euvolemic.  Continue Lasix 20 mg twice daily.  She needs to weigh daily and limit sodium intake.  2.  Valvular heart disease Echocardiogram showed mild to moderate aortic stenosis and moderate to severe mitral stenosis.  Unclear that she is a surgical candidate as she is not active at baseline and most active thing she does on a daily basis is ambulating around her home with a walker.  That being said, I have not ruled out the possibility of right and left heart catheterization and TEE with subsequent referral for valve replacement surgery with Dr. Cornelius Moraswen. She certainly is not a candidate for percutaneous mitral balloon valvuloplasty given severe mitral annular calcification.    3.  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 Ladona Ridgelaylor.  4.   History of recurrent DVT On warfarin chronically. INR 2 on 09/08/18.  5.  Hypertension Continue to monitor.    COVID-19 Education: The signs and symptoms of COVID-19 were discussed with the patient and how to seek care for testing (follow up with PCP or arrange E-visit).  The importance of social distancing was discussed today.  Time:   Today, I have spent 10 minutes with the patient with telehealth technology discussing the above problems.     Medication Adjustments/Labs and Tests Ordered: Current medicines are reviewed at length with the patient today.  Concerns regarding medicines are outlined above.   Tests Ordered: No orders of the defined types were placed in this encounter.   Medication Changes: No orders of the defined types were placed in this encounter.   Follow Up:  Virtual Visit in 3 month(s)  Signed, Prentice DockerSuresh Ruslan Mccabe, MD  09/15/2018 9:50 AM    [facility]

## 2018-09-15 NOTE — Patient Instructions (Signed)
Medication Instructions:  Your physician recommends that you continue on your current medications as directed. Please refer to the Current Medication list given to you today.   Labwork: NONE   Testing/Procedures: NONE  Follow-Up: Your physician recommends that you schedule a follow-up appointment in: 3 Months with Bernerd Pho, PA-C  Any Other Special Instructions Will Be Listed Below (If Applicable).     If you need a refill on your cardiac medications before your next appointment, please call your pharmacy.  Thank you for choosing [facility]!

## 2018-09-15 NOTE — Telephone Encounter (Signed)
Virtual Visit Pre-Appointment Phone Call  "(Name), I am calling you today to discuss your upcoming appointment. We are currently trying to limit exposure to the virus that causes COVID-19 by seeing patients at home rather than in the office."  1. "What is the BEST phone number to call the day of the visit?" - include this in appointment notes  2. Do you have or have access to (through a family member/friend) a smartphone with video capability that we can use for your visit?" a. If yes - list this number in appt notes as cell (if different from BEST phone #) and list the appointment type as a VIDEO visit in appointment notes b. If no - list the appointment type as a PHONE visit in appointment notes  3. Confirm consent - "In the setting of the current Covid19 crisis, you are scheduled for a (phone or video) visit with your provider on (date) at (time).  Just as we do with many in-office visits, in order for you to participate in this visit, we must obtain consent.  If you'd like, I can send this to your mychart (if signed up) or email for you to review.  Otherwise, I can obtain your verbal consent now.  All virtual visits are billed to your insurance company just like a normal visit would be.  By agreeing to a virtual visit, we'd like you to understand that the technology does not allow for your provider to perform an examination, and thus may limit your provider's ability to fully assess your condition. If your provider identifies any concerns that need to be evaluated in person, we will make arrangements to do so.  Finally, though the technology is pretty good, we cannot assure that it will always work on either your or our end, and in the setting of a video visit, we may have to convert it to a phone-only visit.  In either situation, we cannot ensure that we have a secure connection.  Are you willing to proceed?" STAFF: Did the patient verbally acknowledge consent to telehealth visit? Document  YES/NO here: Yes  4. Advise patient to be prepared - "Two hours prior to your appointment, go ahead and check your blood pressure, pulse, oxygen saturation, and your weight (if you have the equipment to check those) and write them all down. When your visit starts, your provider will ask you for this information. If you have an Apple Watch or Kardia device, please plan to have heart rate information ready on the day of your appointment. Please have a pen and paper handy nearby the day of the visit as well."  5. Give patient instructions for MyChart download to smartphone OR [email or web address] as below if video visit (depending on what platform provider is using)  6. Inform patient they will receive a phone call 15 minutes prior to their appointment time (may be from unknown caller ID) so they should be prepared to answer    TELEPHONE CALL NOTE  Cloma Funston Warman has been deemed a candidate for a follow-up tele-health visit to limit community exposure during the Covid-19 pandemic. I spoke with the patient via phone to ensure availability of phone/video source, confirm preferred email & phone number, and discuss instructions and expectations.  I reminded Janese Wiltse Trouten to be prepared with any vital sign and/or heart rhythm information that could potentially be obtained via home monitoring, at the time of her visit. I reminded Claramae Mccay Caloca to expect a phone call prior to  her visit.  Orinda Kenner 09/15/2018 9:37 AM

## 2018-10-05 ENCOUNTER — Ambulatory Visit: Payer: Medicare Other | Admitting: Cardiovascular Disease

## 2018-10-20 ENCOUNTER — Ambulatory Visit (INDEPENDENT_AMBULATORY_CARE_PROVIDER_SITE_OTHER): Payer: Medicare Other | Admitting: *Deleted

## 2018-10-20 DIAGNOSIS — I5033 Acute on chronic diastolic (congestive) heart failure: Secondary | ICD-10-CM

## 2018-10-20 LAB — CUP PACEART REMOTE DEVICE CHECK
Battery Remaining Longevity: 111 mo
Battery Voltage: 3 V
Brady Statistic AP VP Percent: 10.57 %
Brady Statistic AP VS Percent: 0 %
Brady Statistic AS VP Percent: 88.49 %
Brady Statistic AS VS Percent: 0.94 %
Brady Statistic RA Percent Paced: 10.61 %
Brady Statistic RV Percent Paced: 99.06 %
Date Time Interrogation Session: 20200915042528
Implantable Lead Implant Date: 20180921
Implantable Lead Implant Date: 20180921
Implantable Lead Location: 753859
Implantable Lead Location: 753860
Implantable Lead Model: 5076
Implantable Lead Model: 5076
Implantable Pulse Generator Implant Date: 20180921
Lead Channel Impedance Value: 342 Ohm
Lead Channel Impedance Value: 342 Ohm
Lead Channel Impedance Value: 399 Ohm
Lead Channel Impedance Value: 437 Ohm
Lead Channel Pacing Threshold Amplitude: 0.375 V
Lead Channel Pacing Threshold Amplitude: 0.625 V
Lead Channel Pacing Threshold Pulse Width: 0.4 ms
Lead Channel Pacing Threshold Pulse Width: 0.4 ms
Lead Channel Sensing Intrinsic Amplitude: 24.25 mV
Lead Channel Sensing Intrinsic Amplitude: 24.25 mV
Lead Channel Sensing Intrinsic Amplitude: 3.125 mV
Lead Channel Sensing Intrinsic Amplitude: 3.125 mV
Lead Channel Setting Pacing Amplitude: 1.5 V
Lead Channel Setting Pacing Amplitude: 2.5 V
Lead Channel Setting Pacing Pulse Width: 0.4 ms
Lead Channel Setting Sensing Sensitivity: 1.2 mV

## 2018-10-28 NOTE — Progress Notes (Signed)
Remote pacemaker transmission.   

## 2018-12-08 ENCOUNTER — Inpatient Hospital Stay (HOSPITAL_COMMUNITY)
Admission: EM | Admit: 2018-12-08 | Discharge: 2018-12-16 | DRG: 177 | Disposition: A | Discharge status: Skilled Nursing Facility | Payer: Medicare Other | Attending: Internal Medicine | Admitting: Internal Medicine

## 2018-12-08 ENCOUNTER — Encounter (HOSPITAL_COMMUNITY): Payer: Self-pay | Admitting: Emergency Medicine

## 2018-12-08 ENCOUNTER — Emergency Department (HOSPITAL_COMMUNITY): Payer: Medicare Other

## 2018-12-08 ENCOUNTER — Other Ambulatory Visit: Payer: Self-pay

## 2018-12-08 DIAGNOSIS — I452 Bifascicular block: Secondary | ICD-10-CM | POA: Diagnosis present

## 2018-12-08 DIAGNOSIS — R778 Other specified abnormalities of plasma proteins: Secondary | ICD-10-CM | POA: Diagnosis not present

## 2018-12-08 DIAGNOSIS — Z7901 Long term (current) use of anticoagulants: Secondary | ICD-10-CM | POA: Diagnosis not present

## 2018-12-08 DIAGNOSIS — F6081 Narcissistic personality disorder: Secondary | ICD-10-CM | POA: Diagnosis present

## 2018-12-08 DIAGNOSIS — R7989 Other specified abnormal findings of blood chemistry: Secondary | ICD-10-CM | POA: Diagnosis present

## 2018-12-08 DIAGNOSIS — U071 COVID-19: Secondary | ICD-10-CM | POA: Diagnosis present

## 2018-12-08 DIAGNOSIS — K449 Diaphragmatic hernia without obstruction or gangrene: Secondary | ICD-10-CM | POA: Diagnosis present

## 2018-12-08 DIAGNOSIS — I10 Essential (primary) hypertension: Secondary | ICD-10-CM | POA: Diagnosis present

## 2018-12-08 DIAGNOSIS — E119 Type 2 diabetes mellitus without complications: Secondary | ICD-10-CM

## 2018-12-08 DIAGNOSIS — J1289 Other viral pneumonia: Secondary | ICD-10-CM | POA: Diagnosis present

## 2018-12-08 DIAGNOSIS — K760 Fatty (change of) liver, not elsewhere classified: Secondary | ICD-10-CM | POA: Diagnosis present

## 2018-12-08 DIAGNOSIS — J9621 Acute and chronic respiratory failure with hypoxia: Secondary | ICD-10-CM | POA: Diagnosis present

## 2018-12-08 DIAGNOSIS — T380X5A Adverse effect of glucocorticoids and synthetic analogues, initial encounter: Secondary | ICD-10-CM | POA: Diagnosis present

## 2018-12-08 DIAGNOSIS — Z8249 Family history of ischemic heart disease and other diseases of the circulatory system: Secondary | ICD-10-CM

## 2018-12-08 DIAGNOSIS — Z794 Long term (current) use of insulin: Secondary | ICD-10-CM

## 2018-12-08 DIAGNOSIS — N183 Chronic kidney disease, stage 3 unspecified: Secondary | ICD-10-CM | POA: Diagnosis present

## 2018-12-08 DIAGNOSIS — G47411 Narcolepsy with cataplexy: Secondary | ICD-10-CM | POA: Diagnosis not present

## 2018-12-08 DIAGNOSIS — K219 Gastro-esophageal reflux disease without esophagitis: Secondary | ICD-10-CM | POA: Diagnosis present

## 2018-12-08 DIAGNOSIS — E785 Hyperlipidemia, unspecified: Secondary | ICD-10-CM | POA: Diagnosis present

## 2018-12-08 DIAGNOSIS — J9620 Acute and chronic respiratory failure, unspecified whether with hypoxia or hypercapnia: Secondary | ICD-10-CM | POA: Diagnosis present

## 2018-12-08 DIAGNOSIS — I442 Atrioventricular block, complete: Secondary | ICD-10-CM | POA: Diagnosis present

## 2018-12-08 DIAGNOSIS — I38 Endocarditis, valve unspecified: Secondary | ICD-10-CM | POA: Diagnosis not present

## 2018-12-08 DIAGNOSIS — E1165 Type 2 diabetes mellitus with hyperglycemia: Secondary | ICD-10-CM | POA: Diagnosis present

## 2018-12-08 DIAGNOSIS — I4819 Other persistent atrial fibrillation: Secondary | ICD-10-CM | POA: Diagnosis present

## 2018-12-08 DIAGNOSIS — I5032 Chronic diastolic (congestive) heart failure: Secondary | ICD-10-CM | POA: Diagnosis present

## 2018-12-08 DIAGNOSIS — I130 Hypertensive heart and chronic kidney disease with heart failure and stage 1 through stage 4 chronic kidney disease, or unspecified chronic kidney disease: Secondary | ICD-10-CM | POA: Diagnosis not present

## 2018-12-08 DIAGNOSIS — E1122 Type 2 diabetes mellitus with diabetic chronic kidney disease: Secondary | ICD-10-CM | POA: Diagnosis present

## 2018-12-08 DIAGNOSIS — I272 Pulmonary hypertension, unspecified: Secondary | ICD-10-CM | POA: Diagnosis present

## 2018-12-08 DIAGNOSIS — E871 Hypo-osmolality and hyponatremia: Secondary | ICD-10-CM | POA: Diagnosis present

## 2018-12-08 DIAGNOSIS — Z9981 Dependence on supplemental oxygen: Secondary | ICD-10-CM | POA: Diagnosis not present

## 2018-12-08 DIAGNOSIS — F329 Major depressive disorder, single episode, unspecified: Secondary | ICD-10-CM | POA: Diagnosis present

## 2018-12-08 DIAGNOSIS — Z23 Encounter for immunization: Secondary | ICD-10-CM | POA: Diagnosis not present

## 2018-12-08 DIAGNOSIS — E1140 Type 2 diabetes mellitus with diabetic neuropathy, unspecified: Secondary | ICD-10-CM | POA: Diagnosis present

## 2018-12-08 DIAGNOSIS — Z823 Family history of stroke: Secondary | ICD-10-CM

## 2018-12-08 DIAGNOSIS — D509 Iron deficiency anemia, unspecified: Secondary | ICD-10-CM | POA: Diagnosis present

## 2018-12-08 DIAGNOSIS — Z86718 Personal history of other venous thrombosis and embolism: Secondary | ICD-10-CM | POA: Diagnosis not present

## 2018-12-08 DIAGNOSIS — N390 Urinary tract infection, site not specified: Secondary | ICD-10-CM | POA: Diagnosis present

## 2018-12-08 DIAGNOSIS — I248 Other forms of acute ischemic heart disease: Secondary | ICD-10-CM | POA: Diagnosis not present

## 2018-12-08 DIAGNOSIS — I214 Non-ST elevation (NSTEMI) myocardial infarction: Secondary | ICD-10-CM

## 2018-12-08 DIAGNOSIS — Z888 Allergy status to other drugs, medicaments and biological substances status: Secondary | ICD-10-CM

## 2018-12-08 DIAGNOSIS — Z885 Allergy status to narcotic agent status: Secondary | ICD-10-CM

## 2018-12-08 DIAGNOSIS — I083 Combined rheumatic disorders of mitral, aortic and tricuspid valves: Secondary | ICD-10-CM | POA: Diagnosis present

## 2018-12-08 DIAGNOSIS — Z96643 Presence of artificial hip joint, bilateral: Secondary | ICD-10-CM | POA: Diagnosis not present

## 2018-12-08 DIAGNOSIS — Z87891 Personal history of nicotine dependence: Secondary | ICD-10-CM

## 2018-12-08 DIAGNOSIS — Z880 Allergy status to penicillin: Secondary | ICD-10-CM

## 2018-12-08 DIAGNOSIS — R0602 Shortness of breath: Secondary | ICD-10-CM | POA: Diagnosis present

## 2018-12-08 DIAGNOSIS — I342 Nonrheumatic mitral (valve) stenosis: Secondary | ICD-10-CM | POA: Diagnosis not present

## 2018-12-08 DIAGNOSIS — F32A Depression, unspecified: Secondary | ICD-10-CM | POA: Diagnosis present

## 2018-12-08 DIAGNOSIS — Z950 Presence of cardiac pacemaker: Secondary | ICD-10-CM

## 2018-12-08 LAB — COMPREHENSIVE METABOLIC PANEL
ALT: 21 U/L (ref 0–44)
AST: 29 U/L (ref 15–41)
Albumin: 3.8 g/dL (ref 3.5–5.0)
Alkaline Phosphatase: 54 U/L (ref 38–126)
Anion gap: 15 (ref 5–15)
BUN: 26 mg/dL — ABNORMAL HIGH (ref 8–23)
CO2: 21 mmol/L — ABNORMAL LOW (ref 22–32)
Calcium: 8.6 mg/dL — ABNORMAL LOW (ref 8.9–10.3)
Chloride: 97 mmol/L — ABNORMAL LOW (ref 98–111)
Creatinine, Ser: 1.11 mg/dL — ABNORMAL HIGH (ref 0.44–1.00)
GFR calc Af Amer: 55 mL/min — ABNORMAL LOW (ref >60)
GFR calc non Af Amer: 47 mL/min — ABNORMAL LOW (ref >60)
Glucose, Bld: 213 mg/dL — ABNORMAL HIGH (ref 70–99)
Potassium: 4.6 mmol/L (ref 3.5–5.1)
Sodium: 133 mmol/L — ABNORMAL LOW (ref 135–145)
Total Bilirubin: 0.8 mg/dL (ref 0.3–1.2)
Total Protein: 7.7 g/dL (ref 6.5–8.1)

## 2018-12-08 LAB — CBC WITH DIFFERENTIAL/PLATELET
Abs Immature Granulocytes: 0.03 10*3/uL (ref 0.00–0.07)
Basophils Absolute: 0 10*3/uL (ref 0.0–0.1)
Basophils Relative: 0 %
Eosinophils Absolute: 0 10*3/uL (ref 0.0–0.5)
Eosinophils Relative: 0 %
HCT: 42.2 % (ref 36.0–46.0)
Hemoglobin: 13.4 g/dL (ref 12.0–15.0)
Immature Granulocytes: 0 %
Lymphocytes Relative: 6 %
Lymphs Abs: 0.5 10*3/uL — ABNORMAL LOW (ref 0.7–4.0)
MCH: 30.1 pg (ref 26.0–34.0)
MCHC: 31.8 g/dL (ref 30.0–36.0)
MCV: 94.8 fL (ref 80.0–100.0)
Monocytes Absolute: 0.7 10*3/uL (ref 0.1–1.0)
Monocytes Relative: 9 %
Neutro Abs: 7 10*3/uL (ref 1.7–7.7)
Neutrophils Relative %: 85 %
Platelets: 231 10*3/uL (ref 150–400)
RBC: 4.45 MIL/uL (ref 3.87–5.11)
RDW: 15.7 % — ABNORMAL HIGH (ref 11.5–15.5)
WBC: 8.3 10*3/uL (ref 4.0–10.5)
nRBC: 0 % (ref 0.0–0.2)

## 2018-12-08 LAB — FERRITIN: Ferritin: 179 ng/mL (ref 11–307)

## 2018-12-08 LAB — PROCALCITONIN: Procalcitonin: <0.1 ng/mL

## 2018-12-08 LAB — TRIGLYCERIDES: Triglycerides: 75 mg/dL (ref <150)

## 2018-12-08 LAB — URINALYSIS, ROUTINE W REFLEX MICROSCOPIC
Bilirubin Urine: NEGATIVE
Glucose, UA: NEGATIVE mg/dL
Hgb urine dipstick: NEGATIVE
Ketones, ur: 5 mg/dL — AB
Nitrite: POSITIVE — AB
Protein, ur: 30 mg/dL — AB
Specific Gravity, Urine: 1.019 (ref 1.005–1.030)
pH: 5 (ref 5.0–8.0)

## 2018-12-08 LAB — SARS CORONAVIRUS 2 BY RT PCR (HOSPITAL ORDER, PERFORMED IN ~~LOC~~ HOSPITAL LAB): SARS Coronavirus 2: POSITIVE — AB

## 2018-12-08 LAB — LACTATE DEHYDROGENASE: LDH: 184 U/L (ref 98–192)

## 2018-12-08 LAB — LACTIC ACID, PLASMA: Lactic Acid, Venous: 1.2 mmol/L (ref 0.5–1.9)

## 2018-12-08 LAB — D-DIMER, QUANTITATIVE: D-Dimer, Quant: 1.26 ug/mL-FEU — ABNORMAL HIGH (ref 0.00–0.50)

## 2018-12-08 LAB — TROPONIN I (HIGH SENSITIVITY): Troponin I (High Sensitivity): 931 ng/L (ref <18)

## 2018-12-08 LAB — FIBRINOGEN: Fibrinogen: 491 mg/dL — ABNORMAL HIGH (ref 210–475)

## 2018-12-08 LAB — BRAIN NATRIURETIC PEPTIDE: B Natriuretic Peptide: 950 pg/mL — ABNORMAL HIGH (ref 0.0–100.0)

## 2018-12-08 LAB — C-REACTIVE PROTEIN: CRP: 6.9 mg/dL — ABNORMAL HIGH (ref <1.0)

## 2018-12-08 MED ORDER — ACETAMINOPHEN 650 MG RE SUPP
650.0000 mg | Freq: Once | RECTAL | Status: AC
  Administered 2018-12-08: 650 mg via RECTAL
  Filled 2018-12-08: qty 1

## 2018-12-08 MED ORDER — FUROSEMIDE 10 MG/ML IJ SOLN
40.0000 mg | INTRAMUSCULAR | Status: AC
  Administered 2018-12-08: 40 mg via INTRAVENOUS
  Filled 2018-12-08: qty 4

## 2018-12-08 MED ORDER — METHYLPREDNISOLONE SODIUM SUCC 125 MG IJ SOLR
125.0000 mg | Freq: Once | INTRAMUSCULAR | Status: AC
  Administered 2018-12-08: 125 mg via INTRAVENOUS
  Filled 2018-12-08: qty 2

## 2018-12-08 NOTE — ED Triage Notes (Signed)
[facility] - increased weakness, SOB x 2 days. Pt was found lying flat by EMS, 78% on 2LPM. EMS initiated NRB, O2 up to 94% upon arrival. Pt's feet and lips purple upon arrival.

## 2018-12-08 NOTE — ED Provider Notes (Signed)
[facility] EMERGENCY DEPARTMENT Provider Note   CSN: [id number] Arrival date & time: 12/08/18  2002     History   Chief Complaint No chief complaint on file.   HPI Rebecca [name] is a 78 y.o. female.     HPI  On coumadin for DVT - has asymetrical legs - has hx of CHF, EF 55-60%, impaired relaxation, presents with severe SOB -the patient presents by ambulance with severe shortness of breath, she is unable to give me much information as she is speaking in 1-2 word sentences.  She was found to be severely hypoxic to 70% on her home oxygen which is 2 L by nasal cannula on a very long tubing.  She evidently had had EMS out to the house earlier in the day but did not come in at that time.  The patient is not able to give me much else in the way of information, level 5 caveat applies due to severe respiratory distress.  Paramedics report that she had been hypoxic, this did correct somewhat with supplemental oxygen, she does take Coumadin for DVTs.   Past Medical History:  Diagnosis Date  . Aortic stenosis   . Cataplexy   . Diastolic heart failure (HCC)   . Essential hypertension   . GERD (gastroesophageal reflux disease)   . Hiatal hernia   . History of recurrent deep vein thrombosis (DVT)   . Mitral stenosis   . Narcolepsy   . Neuropathy   . Renal disorder   . Type 2 diabetes mellitus [facility](HCC)     Patient Active Problem List   Diagnosis Date Noted  . Lobar pneumonia (HCC) 09/05/2018  . Acute on chronic diastolic CHF (congestive heart failure) (HCC) 09/05/2018  . CHF (congestive heart failure) (HCC) 09/04/2018  . HCAP (healthcare-associated pneumonia) 03/29/2017  . Cellulitis 03/03/2017  . Type 2 diabetes mellitus with other specified complication (HCC) 03/03/2017  . CKD (chronic kidney disease), stage III 03/03/2017  . Status post placement of cardiac pacemaker 10/29/2016  . Pulmonary edema 10/23/2016  . Acute respiratory failure with hypoxia (HCC) 02/22/2015  . CAP  (community acquired pneumonia) 02/22/2015  . Fall at home 09/21/2014  . Mild aortic stenosis 09/08/2014  . Mild to moderate  mitral stenosis 09/08/2014  . Chronic anticoagulation-Coumadin 09/08/2014  . Sepsis (HCC) 09/05/2014  . Lower leg DVT (deep venous thromboembolism), chronic (HCC) 09/05/2014  . Essential hypertension 09/05/2014  . Depression 09/05/2014  . Poorly controlled diabetes mellitus (HCC) 09/05/2014  . Hypomagnesemia 09/05/2014  . Hyponatremia 09/05/2014  . Pyelonephritis 09/05/2014  . Chronic diastolic CHF (congestive heart failure) (HCC)   . 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 GERD (gastroesophageal reflux disease) 07/08/2011  . Gastroparesis 07/08/2011  . IDA (iron deficiency anemia) 07/08/2011  . History of colonic polyps 07/08/2011  . [facility] 07/08/2011  . NAFLD (nonalcoholic fatty liver disease) 78/29/562106/04/2011  . Obesity-BMI 37 07/08/2011    Past Surgical History:  Procedure Laterality Date  . ABDOMINAL HYSTERECTOMY    . CARPAL TUNNEL RELEASE    . CARPECTOMY  12/26/2011   Procedure: CARPECTOMY;  Surgeon: Wyn Forsterobert V Sypher Jr., MD;  Location: [facility];  Service: Orthopedics;  Laterality: Right;  PROXIMAL ROW CARPECTOMY RIGHT WRIST and neurectomy  . CHOLECYSTECTOMY    . HEMORROIDECTOMY    . KIDNEY SURGERY    . NEPHROSTOMY    . PACEMAKER IMPLANT N/A 10/25/2016   Procedure: Pacemaker Implant;  Surgeon: Regan Lemmingamnitz, Will Martin, MD;  [facility]VAS[facility] CV LAB;  Service: Cardiovascular;  Laterality: N/A;  . TOTAL HIP ARTHROPLASTY     bilateral     OB History   No obstetric history on file.      Home Medications    Prior to Admission medications   Medication Sig Start Date End Date Taking? Authorizing Provider  BYDUREON 2 MG PEN Inject 2 mg into the skin once a week. 01/03/16   [provider]  carvedilol (COREG) 3.125 MG tablet TAKE 1 TABLET BY MOUTH TWICE DAILY WITH A MEAL 09/09/18    Herminio Commons, MD  Cholecalciferol (VITAMIN D3) 3000 UNITS TABS Take 1 capsule by mouth daily.    [provider]  clomiPRAMINE (ANAFRANIL) 25 MG capsule Take 25 mg by mouth 4 (four) times daily.     [provider]  clonazePAM (KLONOPIN) 1 MG tablet Take 1 mg by mouth at bedtime.     [provider]  DULoxetine (CYMBALTA) 60 MG capsule Take 60 mg by mouth daily.      [provider]  ferrous sulfate 325 (65 FE) MG tablet Take 325 mg by mouth daily with breakfast.    [provider]  furosemide (LASIX) 40 MG tablet Take 20 mg by mouth 2 (two) times daily.  12/01/14   [provider]  gabapentin (NEURONTIN) 300 MG capsule Take 300 mg by mouth 2 (two) times daily.    [provider]  insulin glargine (LANTUS) 100 unit/mL SOPN Inject 20 Units into the skin 2 (two) times daily.     [provider]  metFORMIN (GLUCOPHAGE) 500 MG tablet Take 500 mg by mouth 2 (two) times daily with a meal.    [provider]  mirtazapine (REMERON) 15 MG tablet Take 15 mg by mouth at bedtime.      [provider]  Multiple Vitamin (MULTIVITAMIN WITH MINERALS) TABS tablet Take 1 tablet by mouth daily.    [provider]  Oxycodone HCl 10 MG TABS Take 10 mg by mouth. Every 4 to 6 hours    [provider]  potassium chloride (K-DUR) 10 MEQ tablet Take 1 tablet (10 mEq total) by mouth daily. 09/21/14   Imogene Burn, PA-C  QUEtiapine (SEROQUEL) 100 MG tablet Take 100 mg by mouth at bedtime.    [provider]  vitamin C (ASCORBIC ACID) 500 MG tablet Take 500 mg by mouth daily.    [provider]  warfarin (COUMADIN) 5 MG tablet Take 2.5-5 mg by mouth every evening. Starting 10/29/2016 take 2.5 mg alternating with 5 mg daily     [provider]    Family History Family History  Problem Relation Age of Onset  . Heart disease Father   . Diabetes Other   . Stroke Mother   .  Scleroderma Brother     Social History Social History   Tobacco Use  . Smoking status: Former Smoker    Quit date: 10/13/1988    Years since quitting: 30.1  . Smokeless tobacco: Never Used  Substance Use Topics  . Alcohol use: No    Alcohol/week: 0.0 standard drinks  . Drug use: No     Allergies   Codeine, Compazine, Other, Penicillins, Morphine and related, and Demerol [meperidine]   Review of Systems Review of Systems  Unable to perform ROS: Acuity of condition     Physical Exam Updated Vital Signs There were no vitals taken for this visit.  Physical Exam Vitals signs and nursing note  reviewed.  Constitutional:      General: She is in acute distress.     Appearance: She is well-developed. She is ill-appearing and toxic-appearing.  HENT:     Head: Normocephalic and atraumatic.     Mouth/Throat:     Pharynx: No oropharyngeal exudate.  Eyes:     General: No scleral icterus.       Right eye: No discharge.        Left eye: No discharge.     Conjunctiva/sclera: Conjunctivae normal.     Pupils: Pupils are equal, round, and reactive to light.  Neck:     Musculoskeletal: Normal range of motion and neck supple.     Thyroid: No thyromegaly.     Vascular: No JVD.  Cardiovascular:     Rate and Rhythm: Normal rate. Rhythm irregular.     Heart sounds: Normal heart sounds. No murmur. No friction rub. No gallop.   Pulmonary:     Effort: Respiratory distress present.     Breath sounds: Wheezing and rales present.  Abdominal:     General: Bowel sounds are normal. There is no distension.     Palpations: Abdomen is soft. There is no mass.     Tenderness: There is no abdominal tenderness.  Musculoskeletal: Normal range of motion.        General: No tenderness.     Right lower leg: Edema present.     Left lower leg: Edema present.  Lymphadenopathy:     Cervical: No cervical adenopathy.  Skin:    General: Skin is warm and dry.     Findings: No erythema or rash.   Neurological:     Coordination: Coordination normal.     Comments: The patient is somnolent but arousable, she is able to move all 4 extremities but severely weak, no obvious facial droop, speech is clear but speaking in only 1-2 word sentences  Psychiatric:        Behavior: Behavior normal.      ED Treatments / Results  Labs (all labs ordered are listed, but only abnormal results are displayed) Labs Reviewed  SARS CORONAVIRUS 2 BY RT PCR (HOSPITAL ORDER, PERFORMED IN [facility]) - Abnormal; Notable for the following components:      Result Value   SARS Coronavirus 2 POSITIVE (*)    All other components within normal limits  COMPREHENSIVE METABOLIC PANEL - Abnormal; Notable for the following components:   Sodium 133 (*)    Chloride 97 (*)    CO2 21 (*)    Glucose, Bld 213 (*)    BUN 26 (*)    Creatinine, Ser 1.11 (*)    Calcium 8.6 (*)    GFR calc non Af Amer 47 (*)    GFR calc Af Amer 55 (*)    All other components within normal limits  BRAIN NATRIURETIC PEPTIDE - Abnormal; Notable for the following components:   B Natriuretic Peptide 950.0 (*)    All other components within normal limits  D-DIMER, QUANTITATIVE (NOT AT [facility]) - Abnormal; Notable for the following components:   D-Dimer, Quant 1.26 (*)    All other components within normal limits  FIBRINOGEN - Abnormal; Notable for the following components:   Fibrinogen 491 (*)    All other components within normal limits  C-REACTIVE PROTEIN - Abnormal; Notable for the following components:   CRP 6.9 (*)    All other components within normal limits  URINALYSIS, ROUTINE W REFLEX MICROSCOPIC - Abnormal; Notable  for the following components:   Color, Urine AMBER (*)    APPearance CLOUDY (*)    Ketones, ur 5 (*)    Protein, ur 30 (*)    Nitrite POSITIVE (*)    Leukocytes,Ua TRACE (*)    Bacteria, UA RARE (*)    All other components within normal limits  CBC WITH DIFFERENTIAL/PLATELET - Abnormal; Notable for  the following components:   RDW 15.7 (*)    Lymphs Abs 0.5 (*)    All other components within normal limits  TROPONIN I (HIGH SENSITIVITY) - Abnormal; Notable for the following components:   Troponin I (High Sensitivity) 931 (*)    All other components within normal limits  CULTURE, BLOOD (ROUTINE X 2)  CULTURE, BLOOD (ROUTINE X 2)  URINE CULTURE  PROCALCITONIN  LACTATE DEHYDROGENASE  FERRITIN  TRIGLYCERIDES  LACTIC ACID, PLASMA  CBC WITH DIFFERENTIAL/PLATELET  LACTIC ACID, PLASMA  TROPONIN I (HIGH SENSITIVITY)    EKG EKG Interpretation  Date/Time:  Tuesday December 08 2018 20:09:54 EST Ventricular Rate:  91 PR Interval:    QRS Duration: 153 QT Interval:  422 QTC Calculation: 493 R Axis:   -119 Text Interpretation: Atrial fibrillation Ventricular premature complex Nonspecific IVCD with LAD Probable inferior infarct, acute Anterior infarct, old PVC's Confirmed by Eber Hong ([id number]) on 12/08/2018 8:13:54 PM   Radiology Dg Chest Port 1 View  Result Date: 12/08/2018 CLINICAL DATA:  Shortness of breath for 2 days. EXAM: PORTABLE CHEST 1 VIEW COMPARISON:  Chest radiograph 09/03/2018 and CT 09/04/2018 FINDINGS: A dual lead pacemaker remains in place. The cardiac silhouette remains enlarged. Hazy airspace opacity is again seen in both lungs, slightly worse than on the prior study. The interstitial markings are mildly increased diffusely. No sizable pleural effusion or pneumothorax is identified. No acute osseous abnormality is seen. Aortic atherosclerosis is noted. IMPRESSION: Cardiomegaly with bilateral airspace opacities which are mildly increased compared to 09/03/2018 and may reflect edema or infection. Electronically Signed   By: Sebastian Ache M.D.   On: 12/08/2018 20:56    Procedures .Critical Care Performed by: Eber Hong, MD Authorized by: Eber Hong, MD   Critical care provider statement:    Critical care time (minutes):  50   Critical care time was exclusive of:   Separately billable procedures and treating other patients and teaching time   Critical care was necessary to treat or prevent imminent or life-threatening deterioration of the following conditions:  Respiratory failure and cardiac failure   Critical care was time spent personally by me on the following activities:  Blood draw for specimens, development of treatment plan with patient or surrogate, discussions with consultants, evaluation of patient's response to treatment, examination of patient, obtaining history from patient or surrogate, ordering and performing treatments and interventions, ordering and review of laboratory studies, ordering and review of radiographic studies, pulse oximetry, re-evaluation of patient's condition and review of old charts   (including critical care time)  Medications Ordered in ED Medications - No data to display   Initial Impression / Assessment and Plan / ED Course  I have reviewed the triage vital signs and the nursing notes.  Pertinent labs & imaging results that were available during my care of the patient were reviewed by me and considered in my medical decision making (see chart for details).  Clinical Course as of Dec 08 2246  Tue Dec 08, 2018  2238 The patient is Covid positive, thankfully she is oxygenating well on a nonrebreather, her temperature is been  treated with Tylenol, she has no leukocytosis, her BNP is elevated 950.  Other testing at this time shows a normal undetectable pro calcitonin, a slightly elevated CRP, ferritin of 179, fibrinogen of 491, slightly elevated D-dimer and a triglyceride of 75.  Her urinalysis reveals positive nitrite, 6-10 white blood cells, this will be cultured.  Chest x-ray reveals likely pulmonary edema or infiltrates.  This is also consistent with Covid.  The patient later made aware that her son who has Covid has been spending time with her.   [BM]    Clinical Course User Index [BM] Eber HongMiller, Annaleese Guier, MD        This patient is ill-appearing, her EKG reveals what appears to be a left bundle branch block, she has findings of possible congestive heart failure but would also consider either pulmonary fibrosis or infectious etiology.  Most likely going to be congestive heart failure.  That being said we will test for coronavirus, she will need BiPAP due to high flow nasal cannula only getting her to approximately 84%.  She is still dyspneic, pursed lip breathing and has rales in all lung fields.  Testing reveals an elevated troponin IX 131, metabolic panel is overall unremarkable as is CBC.  Covid test is positive, x-ray shows as suspected bilateral infiltrates or edema.  The patient has been given Lasix, Tylenol by rectum for the fever, steroids to help with breathing, will consult with hospitalist for admission, she is critically ill   Final Clinical Impressions(s) / ED Diagnoses   Final diagnoses:  NSTEMI (non-ST elevated myocardial infarction) (HCC)  Pneumonia due to COVID-19 virus      Eber HongMiller, Kennedy Bohanon, MD 12/08/18 2249

## 2018-12-08 NOTE — ED Notes (Signed)
Called c-link at this time to put pt on transport list . Rebecca [name]

## 2018-12-08 NOTE — ED Notes (Signed)
Date and time results received: 12/08/18 2240 (use smartphrase ".now" to insert current time)  Test: troponin Critical Value: 931  Name of Provider Notified: Dr. Sabra Heck  Orders Received? Or Actions Taken?: no orders received

## 2018-12-08 NOTE — ED Notes (Signed)
Date and time results received: 12/08/18 2155 (use smartphrase ".now" to insert current time)  Test: Covid Critical Value: positive  Name of Provider Notified: Dr. Sabra Heck  Orders Received? Or Actions Taken?: Actions Taken: no orders received

## 2018-12-08 NOTE — H&P (Signed)
History and Physical    Rebecca [name] WVT:[id number] DOB: May 20, 1940 DOA: 12/08/2018  PCP: [name], MD   Patient coming from: Home.  I have personally briefly reviewed patient's old medical records in [facility]  Chief Complaint: Shortness of breath.  HPI: Rebecca [name] is a 78 y.o. female with medical history significant of aortic stenosis, cataplexy, diastolic heart failure, essential hypertension, GERD/hiatal hernia, history of recurrent DVT, narcolepsy, peripheral neuropathy, unspecified chronic renal disease, type 2 diabetes, morbid obesity on home oxygen who is coming to the emergency department due to progressively worse hypoxia and weakness for the past 2 days.  The patient was found lying down flat with an O2 sat of 78% on 2 LPM.  The patient responded to NRB mask oxygen and her O2 sat improved to the mid 90s afterwards.  She was exposed to a family member with COVID-19 recently.  She denies headache, rhinorrhea, sore throat, chest pain, palpitations, dizziness, abdominal pain, nausea, vomiting, diarrhea, constipation, melena or hematochezia.  No dysuria, frequency or hematuria.  No polyuria, polydipsia, polyphagia or blurred vision.  ED Course: Initial vital signs temperature 102.9 F, pulse 91, respirations 31, blood pressure 115/66 mmHg and O2 sat 95% on NRB mask.  The patient had a trial of oxygen via nasal cannula but desaturated to the mid 80s.  She received 650 mg of acetaminophen rectal.  Furosemide 40 mg IVP x1 dose and 125 mg of Solu-Medrol.  Urinalysis was cloudy, with ketonuria 5 and proteinuria 30 mg/dL, positive nitrates trace leukocyte esterase with rare bacteria on microscopic examination. Her CBC was unremarkable. D-dimer was 126 mcg/mL.  CRP was 6.9 and fibrinogen 491 mg/dL. LDH, ferritin, lactic acid and procalcitonin were normal. Troponin was 931 and then 3537 ng/mL.  EKG showed normal heart rate atrial fibrillation, which is changed from ventricular paced  rhythm.  Sodium is 133, potassium 4.6, chloride 97 and CO2 21 mmol/L.  BUN 26, creatinine 1.11, glucose 213, magnesium 2.2, phosphorus 4.6 and calcium 8.6 mg/dL.  Her LFTs were within normal limits.  Her chest radiograph showed cardiomegaly with bilateral airspace opacities which are mildly increased when compared to previous film.   Review of Systems: As per HPI otherwise 10 point review of systems negative.   Past Medical History:  Diagnosis Date  . Aortic stenosis   . Cataplexy   . Diastolic heart failure (HCC)   . Essential hypertension   . GERD (gastroesophageal reflux disease)   . Hiatal hernia   . History of recurrent deep vein thrombosis (DVT)   . Mitral stenosis   . Narcolepsy   . Neuropathy   . Renal disorder   . Type 2 diabetes mellitus (HCC)     Past Surgical History:  Procedure Laterality Date  . ABDOMINAL HYSTERECTOMY    . CARPAL TUNNEL RELEASE    . CARPECTOMY  12/26/2011   Procedure: CARPECTOMY;  Surgeon: Wyn Forster., MD;  Location: [facility];  Service: Orthopedics;  Laterality: Right;  PROXIMAL ROW CARPECTOMY RIGHT WRIST and neurectomy  . CHOLECYSTECTOMY    . HEMORROIDECTOMY    . KIDNEY SURGERY    . NEPHROSTOMY    . PACEMAKER IMPLANT N/A 10/25/2016   Procedure: Pacemaker Implant;  Surgeon: Regan Lemming, MD;  Location: [facility] INVASIVE CV LAB;  Service: Cardiovascular;  Laterality: N/A;  . TOTAL HIP ARTHROPLASTY     bilateral     reports that she quit smoking about 30 years ago. She has never used smokeless  tobacco. She reports that she does not drink alcohol or use drugs.  Allergies  Allergen Reactions  . Codeine Anxiety  . Compazine Anaphylaxis  . Other Anaphylaxis, Rash and Other (See Comments)    Uncoded Allergy. Allergen: INOVAR Uncoded Allergy. Allergen: ALL ADHESIVES Uncoded Allergy. Allergen: SILK SUTURE Uncoded Allergy. Allergen: merthiolate Thermasol  . Penicillins Shortness Of Breath and Rash    Has patient had a PCN  reaction causing immediate rash, facial/tongue/throat swelling, SOB or lightheadedness with hypotension: Yes Has patient had a PCN reaction causing severe rash involving mucus membranes or skin necrosis: No Has patient had a PCN reaction that required hospitalization No Has patient had a PCN reaction occurring within the last 10 years: Yes If all of the above answers are "NO", then may proceed with Cephalosporin use.   Marland Kitchen. Morphine And Related Other (See Comments)    Patient states that it makes her lose her state of mind.   . Demerol [Meperidine] Rash    Family History  Problem Relation Age of Onset  . Heart disease Father   . Diabetes Other   . Stroke Mother   . Scleroderma Brother    Prior to Admission medications   Medication Sig Start Date End Date Taking? Authorizing Provider  carvedilol (COREG) 3.125 MG tablet TAKE 1 TABLET BY MOUTH TWICE DAILY WITH A MEAL Patient taking differently: Take 3.125 mg by mouth 2 (two) times daily with a meal.  09/09/18  Yes Laqueta LindenKoneswaran, Suresh A, MD  clobetasol cream (TEMOVATE) 0.05 % Apply 1 application topically daily.  11/11/18  Yes [provider]  clomiPRAMINE (ANAFRANIL) 25 MG capsule Take 25 mg by mouth 4 (four) times daily.    Yes [provider]  clonazePAM (KLONOPIN) 2 MG tablet Take 2 mg by mouth at bedtime. 11/11/18  Yes [provider]  DULoxetine (CYMBALTA) 60 MG capsule Take 60 mg by mouth daily.     Yes [provider]  ferrous sulfate 325 (65 FE) MG tablet Take 325 mg by mouth daily with breakfast.   Yes [provider]  furosemide (LASIX) 40 MG tablet Take 20 mg by mouth 2 (two) times daily.  12/01/14  Yes [provider]  gabapentin (NEURONTIN) 300 MG capsule Take 300 mg by mouth 2 (two) times daily.   Yes [provider]  metFORMIN (GLUCOPHAGE) 500 MG tablet Take 500 mg by mouth 2 (two) times daily with a meal.   Yes [provider]  mirtazapine (REMERON) 15 MG  tablet Take 15 mg by mouth at bedtime.     Yes [provider]  potassium chloride (K-DUR) 10 MEQ tablet Take 1 tablet (10 mEq total) by mouth daily. Patient taking differently: Take 10 mEq by mouth 2 (two) times daily.  09/21/14  Yes Dyann KiefLenze, Michele M, PA-C  QUEtiapine (SEROQUEL) 100 MG tablet Take 100 mg by mouth at bedtime.   Yes [provider]  traMADol (ULTRAM) 50 MG tablet Take 50 mg by mouth 3 (three) times daily. 11/30/18  Yes [provider]  warfarin (COUMADIN) 5 MG tablet Take 2.5-5 mg by mouth every evening. Starting 10/29/2016 take 2.5 mg alternating with 5 mg daily    Yes [provider]  BYDUREON 2 MG PEN Inject 2 mg into the skin once a week. 01/03/16   [provider]  Cholecalciferol (VITAMIN D3) 3000 UNITS TABS Take 1 capsule by mouth daily.    [provider]  insulin glargine (LANTUS) 100 unit/mL SOPN Inject 20 Units  into the skin 2 (two) times daily.     [provider]  Multiple Vitamin (MULTIVITAMIN WITH MINERALS) TABS tablet Take 1 tablet by mouth daily.    [provider]  Oxycodone HCl 10 MG TABS Take 10 mg by mouth. Every 4 to 6 hours    [provider]  vitamin C (ASCORBIC ACID) 500 MG tablet Take 500 mg by mouth daily.    [provider]    Physical Exam: Vitals:   12/08/18 2245 12/08/18 2300 12/08/18 2315 12/08/18 2323  BP: (!) 142/76 133/85 (!) 143/63   Pulse: 74 63 71   Resp: (!) 35 (!) 35 (!) 26   Temp:    100 F (37.8 C)  TempSrc:    Rectal  SpO2: 100% 97% 99%   Weight:      Height:        Constitutional: Looks acutely ill. Eyes: PERRL, lids and conjunctivae normal ENMT: Mucous membranes are moist. Posterior pharynx clear of any exudate or lesions. Neck: Normal, supple, no masses, no thyromegaly Respiratory: Tachypneic, decreased breath sounds bilaterally with bibasilar crackles, no wheezing, no crackles.  No accessory muscle use.  Cardiovascular: Regular rate  with an irregularly irregular rhythm, no murmurs / rubs / gallops. No extremity edema. 2+ pedal pulses. No carotid bruits.  Abdomen: Obese, nondistended.  Soft, no tenderness, no masses palpated. No hepatosplenomegaly. Bowel sounds positive.  Musculoskeletal: no clubbing / cyanosis. Good ROM, no contractures. Normal muscle tone.  Skin: Small areas of ecchymosis on extremities. Neurologic: CN 2-12 grossly intact. Sensation intact, DTR normal. Strength 5/5 in all 4.  Psychiatric: Somnolent, but wakes up and answer questions. Normal mood.    Labs on Admission: I have personally reviewed following labs and imaging studies  CBC: Recent Labs  Lab 12/08/18 2130  WBC 8.3  NEUTROABS 7.0  HGB 13.4  HCT 42.2  MCV 94.8  PLT 231   Basic Metabolic Panel: Recent Labs  Lab 12/08/18 2113  NA 133*  K 4.6  CL 97*  CO2 21*  GLUCOSE 213*  BUN 26*  CREATININE 1.11*  CALCIUM 8.6*   GFR: Estimated Creatinine Clearance: 53 mL/min (A) (by C-G formula based on SCr of 1.11 mg/dL (H)). Liver Function Tests: Recent Labs  Lab 12/08/18 2113  AST 29  ALT 21  ALKPHOS 54  BILITOT 0.8  PROT 7.7  ALBUMIN 3.8   No results for input(s): LIPASE, AMYLASE in the last 168 hours. No results for input(s): AMMONIA in the last 168 hours. Coagulation Profile: No results for input(s): INR, PROTIME in the last 168 hours. Cardiac Enzymes: No results for input(s): CKTOTAL, CKMB, CKMBINDEX, TROPONINI in the last 168 hours. BNP (last 3 results) No results for input(s): PROBNP in the last 8760 hours. HbA1C: No results for input(s): HGBA1C in the last 72 hours. CBG: No results for input(s): GLUCAP in the last 168 hours. Lipid Profile: Recent Labs    12/08/18 2113  TRIG 75   Thyroid Function Tests: No results for input(s): TSH, T4TOTAL, FREET4, T3FREE, THYROIDAB in the last 72 hours. Anemia Panel: Recent Labs    12/08/18 2113  FERRITIN 179   Urine analysis:    Component Value Date/Time    COLORURINE AMBER (A) 12/08/2018 2046   APPEARANCEUR CLOUDY (A) 12/08/2018 2046   LABSPEC 1.019 12/08/2018 2046   PHURINE 5.0 12/08/2018 2046   GLUCOSEU NEGATIVE 12/08/2018 2046   HGBUR NEGATIVE 12/08/2018 2046   BILIRUBINUR NEGATIVE 12/08/2018 2046   KETONESUR 5 (A) 12/08/2018 2046  PROTEINUR 30 (A) 12/08/2018 2046   UROBILINOGEN 0.2 09/05/2014 2015   NITRITE POSITIVE (A) 12/08/2018 2046   LEUKOCYTESUR TRACE (A) 12/08/2018 2046    Radiological Exams on Admission: Dg Chest Port 1 View  Result Date: 12/08/2018 CLINICAL DATA:  Shortness of breath for 2 days. EXAM: PORTABLE CHEST 1 VIEW COMPARISON:  Chest radiograph 09/03/2018 and CT 09/04/2018 FINDINGS: A dual lead pacemaker remains in place. The cardiac silhouette remains enlarged. Hazy airspace opacity is again seen in both lungs, slightly worse than on the prior study. The interstitial markings are mildly increased diffusely. No sizable pleural effusion or pneumothorax is identified. No acute osseous abnormality is seen. Aortic atherosclerosis is noted. IMPRESSION: Cardiomegaly with bilateral airspace opacities which are mildly increased compared to 09/03/2018 and may reflect edema or infection. Electronically Signed   By: Sebastian AcheAllen  Grady M.D.   On: 12/08/2018 20:56   09/07/2018 echocardiogram  IMPRESSIONS  1. Stage 1: stage1: Apical anterior segment and apex are abnormal.  2. The left ventricle has normal systolic function, with an ejection fraction of 55-60% (contrast employed). The cavity size was normal. There is moderate concentric left ventricular hypertrophy. Left ventricular diastolic Doppler parameters are  consistent with impaired relaxation.  3. The right ventricle has normal systolc function. The cavity was normal. There is no increase in right ventricular wall thickness.  4. The aortic valve is tricuspid Mild thickening of the aortic valve Mild calcification of the aortic valve. Aortic valve regurgitation was not assessed by  color flow Doppler.. Moderate aortic annular calcification noted.  5. The mitral valve is degenerative. Moderate thickening of the mitral valve leaflet. There is severe mitral annular calcification present.  6. The aortic root is normal in size and structure.  7. Left atrial size was severely dilated.  8. Right atrial size was mildly dilated.  9. The interatrial septum was not well visualized. 10. The tricuspid valve was grossly normal. Tricuspid valve regurgitation was not assessed by color flow Doppler.  EKG: Independently reviewed. Vent. rate 91 BPM PR interval * ms QRS duration 153 ms QT/QTc 422/493 ms P-R-T axes * 241 68 Atrial fibrillation with extrasystoles Ventricular premature complex Nonspecific IVCD with LAD Probable inferior infarct, acute Anterior infarct, old  Assessment/Plan Principal Problem:   Pneumonia due to COVID-19 virus Admit to progressive unit/inpatient. Continue supplemental oxygen. Schedule and as needed bronchodilators. Solu-Medrol 0.5 mg/kg every 12 hours. Follow-up CBC, CMP and inflammatory markers.  Active Problems:   Elevated troponin Discussed with cardiology on-call. Likely due to demand ischemia. Will continue trending. No need for further cardiac work-up.    Paroxysmal vs new atrial fibrillation (HCC) CHA?DS?-VASc Score of at least 7. Had recent echo. On carvedilol. On warfarin for DVTs.    GERD (gastroesophageal reflux disease) Protonix 40 mg p.o. daily.    Essential hypertension After med rec performed. Continue carvedilol 3.125 mg p.o. twice daily. On furosemide 20 mg p.o. twice daily.    Depression Once med reconciliation done. Continue Cymbalta 60 mg p.o. at bedtime. Continue Seroquel 100 mg p.o. bedtime. Use clonazepam as needed at bedtime.    Chronic diastolic CHF (congestive heart failure) (HCC) Continue beta-blocker and diuretic.    History of recurrent deep vein thrombosis (DVT) On warfarin per pharmacy.     Type 2 diabetes mellitus (HCC) Carbohydrate modified diet. Continue Lantus 20 units SQ twice daily. CBG monitoring with RI SS.   DVT prophylaxis: Warfarin. Code Status: Full code. Family Communication: [facility]  [facility]. Consults called: Admission status: Inpatient/progressive unit.   Reubin Milan MD [facility]  If 7PM-7AM, please contact night-coverage [email or web address]  12/08/2018, 11:57 PM   This document has been prepared using Dragon voice recognition software and may contain some unintended transcription errors.

## 2018-12-09 ENCOUNTER — Inpatient Hospital Stay (HOSPITAL_COMMUNITY): Payer: Medicare Other

## 2018-12-09 DIAGNOSIS — I248 Other forms of acute ischemic heart disease: Secondary | ICD-10-CM

## 2018-12-09 DIAGNOSIS — K760 Fatty (change of) liver, not elsewhere classified: Secondary | ICD-10-CM

## 2018-12-09 DIAGNOSIS — I342 Nonrheumatic mitral (valve) stenosis: Secondary | ICD-10-CM

## 2018-12-09 DIAGNOSIS — I10 Essential (primary) hypertension: Secondary | ICD-10-CM

## 2018-12-09 DIAGNOSIS — U071 COVID-19: Principal | ICD-10-CM

## 2018-12-09 DIAGNOSIS — E871 Hypo-osmolality and hyponatremia: Secondary | ICD-10-CM

## 2018-12-09 DIAGNOSIS — J1289 Other viral pneumonia: Secondary | ICD-10-CM

## 2018-12-09 DIAGNOSIS — N183 Chronic kidney disease, stage 3 unspecified: Secondary | ICD-10-CM

## 2018-12-09 DIAGNOSIS — J9621 Acute and chronic respiratory failure with hypoxia: Secondary | ICD-10-CM

## 2018-12-09 DIAGNOSIS — I4819 Other persistent atrial fibrillation: Secondary | ICD-10-CM

## 2018-12-09 DIAGNOSIS — I38 Endocarditis, valve unspecified: Secondary | ICD-10-CM

## 2018-12-09 DIAGNOSIS — J9620 Acute and chronic respiratory failure, unspecified whether with hypoxia or hypercapnia: Secondary | ICD-10-CM | POA: Diagnosis present

## 2018-12-09 DIAGNOSIS — I480 Paroxysmal atrial fibrillation: Secondary | ICD-10-CM | POA: Insufficient documentation

## 2018-12-09 LAB — CBC WITH DIFFERENTIAL/PLATELET
Abs Immature Granulocytes: 0.01 10*3/uL (ref 0.00–0.07)
Basophils Absolute: 0 10*3/uL (ref 0.0–0.1)
Basophils Relative: 0 %
Eosinophils Absolute: 0 10*3/uL (ref 0.0–0.5)
Eosinophils Relative: 0 %
HCT: 41 % (ref 36.0–46.0)
Hemoglobin: 13.1 g/dL (ref 12.0–15.0)
Immature Granulocytes: 0 %
Lymphocytes Relative: 6 %
Lymphs Abs: 0.5 10*3/uL — ABNORMAL LOW (ref 0.7–4.0)
MCH: 30.3 pg (ref 26.0–34.0)
MCHC: 32 g/dL (ref 30.0–36.0)
MCV: 94.9 fL (ref 80.0–100.0)
Monocytes Absolute: 0.1 10*3/uL (ref 0.1–1.0)
Monocytes Relative: 2 %
Neutro Abs: 7 10*3/uL (ref 1.7–7.7)
Neutrophils Relative %: 92 %
Platelets: 227 10*3/uL (ref 150–400)
RBC: 4.32 MIL/uL (ref 3.87–5.11)
RDW: 15.5 % (ref 11.5–15.5)
WBC: 7.6 10*3/uL (ref 4.0–10.5)
nRBC: 0 % (ref 0.0–0.2)

## 2018-12-09 LAB — CBG MONITORING, ED
Glucose-Capillary: 248 mg/dL — ABNORMAL HIGH (ref 70–99)
Glucose-Capillary: 219 mg/dL — ABNORMAL HIGH (ref 70–99)
Glucose-Capillary: 163 mg/dL — ABNORMAL HIGH (ref 70–99)
Glucose-Capillary: 145 mg/dL — ABNORMAL HIGH (ref 70–99)

## 2018-12-09 LAB — TROPONIN I (HIGH SENSITIVITY)
Troponin I (High Sensitivity): 3537 ng/L (ref <18)
Troponin I (High Sensitivity): 5541 ng/L (ref <18)
Troponin I (High Sensitivity): 6073 ng/L (ref <18)

## 2018-12-09 LAB — COMPREHENSIVE METABOLIC PANEL
ALT: 23 U/L (ref 0–44)
AST: 47 U/L — ABNORMAL HIGH (ref 15–41)
Albumin: 3.6 g/dL (ref 3.5–5.0)
Alkaline Phosphatase: 50 U/L (ref 38–126)
Anion gap: 14 (ref 5–15)
BUN: 34 mg/dL — ABNORMAL HIGH (ref 8–23)
CO2: 22 mmol/L (ref 22–32)
Calcium: 8.6 mg/dL — ABNORMAL LOW (ref 8.9–10.3)
Chloride: 98 mmol/L (ref 98–111)
Creatinine, Ser: 1.21 mg/dL — ABNORMAL HIGH (ref 0.44–1.00)
GFR calc Af Amer: 49 mL/min — ABNORMAL LOW (ref >60)
GFR calc non Af Amer: 43 mL/min — ABNORMAL LOW (ref >60)
Glucose, Bld: 261 mg/dL — ABNORMAL HIGH (ref 70–99)
Potassium: 4.4 mmol/L (ref 3.5–5.1)
Sodium: 134 mmol/L — ABNORMAL LOW (ref 135–145)
Total Bilirubin: 0.6 mg/dL (ref 0.3–1.2)
Total Protein: 7.6 g/dL (ref 6.5–8.1)

## 2018-12-09 LAB — ECHOCARDIOGRAM LIMITED
Height: 68 in
Weight: 3820.13 oz

## 2018-12-09 LAB — LACTIC ACID, PLASMA: Lactic Acid, Venous: 1.1 mmol/L (ref 0.5–1.9)

## 2018-12-09 LAB — PROTIME-INR
INR: 2.1 — ABNORMAL HIGH (ref 0.8–1.2)
INR: 2.1 — ABNORMAL HIGH (ref 0.8–1.2)
Prothrombin Time: 23.2 seconds — ABNORMAL HIGH (ref 11.4–15.2)
Prothrombin Time: 23.2 seconds — ABNORMAL HIGH (ref 11.4–15.2)

## 2018-12-09 LAB — D-DIMER, QUANTITATIVE: D-Dimer, Quant: 1.3 ug/mL-FEU — ABNORMAL HIGH (ref 0.00–0.50)

## 2018-12-09 LAB — MAGNESIUM: Magnesium: 2.2 mg/dL (ref 1.7–2.4)

## 2018-12-09 LAB — ABO/RH: ABO/RH(D): A POS

## 2018-12-09 LAB — PHOSPHORUS: Phosphorus: 4.6 mg/dL (ref 2.5–4.6)

## 2018-12-09 MED ORDER — IPRATROPIUM-ALBUTEROL 20-100 MCG/ACT IN AERS
2.0000 | INHALATION_SPRAY | Freq: Four times a day (QID) | RESPIRATORY_TRACT | Status: DC
  Administered 2018-12-09 – 2018-12-16 (×24): 2 via RESPIRATORY_TRACT
  Filled 2018-12-09 (×3): qty 4

## 2018-12-09 MED ORDER — METHYLPREDNISOLONE SODIUM SUCC 125 MG IJ SOLR
0.5000 mg/kg | Freq: Two times a day (BID) | INTRAMUSCULAR | Status: DC
  Administered 2018-12-09 – 2018-12-10 (×3): 54.375 mg via INTRAVENOUS
  Filled 2018-12-09 (×3): qty 2

## 2018-12-09 MED ORDER — ZINC SULFATE 220 (50 ZN) MG PO CAPS
220.0000 mg | ORAL_CAPSULE | Freq: Every day | ORAL | Status: DC
  Administered 2018-12-10 – 2018-12-15 (×6): 220 mg via ORAL
  Filled 2018-12-09 (×9): qty 1

## 2018-12-09 MED ORDER — ONDANSETRON HCL 4 MG PO TABS: 4.0000 mg | ORAL_TABLET | Freq: Four times a day (QID) | ORAL | Status: DC | PRN

## 2018-12-09 MED ORDER — DULOXETINE HCL 60 MG PO CPEP
60.0000 mg | ORAL_CAPSULE | Freq: Every day | ORAL | Status: DC
  Administered 2018-12-10 – 2018-12-16 (×7): 60 mg via ORAL
  Filled 2018-12-09 (×8): qty 1

## 2018-12-09 MED ORDER — VITAMIN C 500 MG PO TABS
500.0000 mg | ORAL_TABLET | Freq: Every day | ORAL | Status: DC
  Administered 2018-12-10 – 2018-12-16 (×7): 500 mg via ORAL
  Filled 2018-12-09 (×10): qty 1

## 2018-12-09 MED ORDER — CARVEDILOL 3.125 MG PO TABS
3.1250 mg | ORAL_TABLET | Freq: Two times a day (BID) | ORAL | Status: DC
  Administered 2018-12-10 – 2018-12-16 (×13): 3.125 mg via ORAL
  Filled 2018-12-09 (×17): qty 1

## 2018-12-09 MED ORDER — SODIUM CHLORIDE 0.9 % IV SOLN
100.0000 mg | INTRAVENOUS | Status: AC
  Administered 2018-12-10 – 2018-12-13 (×4): 100 mg via INTRAVENOUS
  Filled 2018-12-09 (×4): qty 20

## 2018-12-09 MED ORDER — INSULIN GLARGINE 100 UNIT/ML ~~LOC~~ SOLN
20.0000 [IU] | Freq: Every day | SUBCUTANEOUS | Status: DC
  Administered 2018-12-09 – 2018-12-14 (×6): 20 [IU] via SUBCUTANEOUS
  Filled 2018-12-09 (×9): qty 0.2

## 2018-12-09 MED ORDER — GABAPENTIN 300 MG PO CAPS
300.0000 mg | ORAL_CAPSULE | Freq: Two times a day (BID) | ORAL | Status: DC
  Administered 2018-12-09 – 2018-12-13 (×9): 300 mg via ORAL
  Filled 2018-12-09 (×10): qty 1

## 2018-12-09 MED ORDER — SODIUM CHLORIDE 0.9 % IV SOLN
200.0000 mg | Freq: Once | INTRAVENOUS | Status: AC
  Administered 2018-12-09: 200 mg via INTRAVENOUS
  Filled 2018-12-09: qty 40

## 2018-12-09 MED ORDER — INSULIN ASPART 100 UNIT/ML ~~LOC~~ SOLN
0.0000 [IU] | Freq: Every day | SUBCUTANEOUS | Status: DC
  Administered 2018-12-10: 3 [IU] via SUBCUTANEOUS
  Administered 2018-12-12: 2 [IU] via SUBCUTANEOUS
  Administered 2018-12-13 – 2018-12-14 (×2): 4 [IU] via SUBCUTANEOUS

## 2018-12-09 MED ORDER — INSULIN ASPART 100 UNIT/ML ~~LOC~~ SOLN
0.0000 [IU] | Freq: Three times a day (TID) | SUBCUTANEOUS | Status: DC
  Administered 2018-12-09: 7 [IU] via SUBCUTANEOUS
  Administered 2018-12-09: 4 [IU] via SUBCUTANEOUS
  Administered 2018-12-09: 7 [IU] via SUBCUTANEOUS
  Administered 2018-12-10: 4 [IU] via SUBCUTANEOUS
  Administered 2018-12-10: 7 [IU] via SUBCUTANEOUS
  Administered 2018-12-10: 3 [IU] via SUBCUTANEOUS
  Administered 2018-12-11: 7 [IU] via SUBCUTANEOUS
  Administered 2018-12-11 – 2018-12-12 (×2): 11 [IU] via SUBCUTANEOUS
  Administered 2018-12-12: 4 [IU] via SUBCUTANEOUS
  Administered 2018-12-12: 3 [IU] via SUBCUTANEOUS
  Administered 2018-12-13: 4 [IU] via SUBCUTANEOUS
  Administered 2018-12-13: 7 [IU] via SUBCUTANEOUS
  Administered 2018-12-13: 15 [IU] via SUBCUTANEOUS
  Administered 2018-12-14: 20 [IU] via SUBCUTANEOUS
  Administered 2018-12-14: 7 [IU] via SUBCUTANEOUS
  Administered 2018-12-14: 4 [IU] via SUBCUTANEOUS
  Filled 2018-12-09 (×3): qty 1

## 2018-12-09 MED ORDER — GUAIFENESIN-DM 100-10 MG/5ML PO SYRP
10.0000 mL | ORAL_SOLUTION | ORAL | Status: DC | PRN
  Administered 2018-12-14 – 2018-12-15 (×2): 10 mL via ORAL
  Filled 2018-12-09 (×2): qty 10

## 2018-12-09 MED ORDER — ACETAMINOPHEN 650 MG RE SUPP: 650.0000 mg | Freq: Four times a day (QID) | RECTAL | Status: DC | PRN

## 2018-12-09 MED ORDER — ACETAMINOPHEN 325 MG PO TABS
650.0000 mg | ORAL_TABLET | Freq: Four times a day (QID) | ORAL | Status: DC | PRN
  Administered 2018-12-14 – 2018-12-16 (×2): 650 mg via ORAL
  Filled 2018-12-09 (×2): qty 2

## 2018-12-09 MED ORDER — ALBUTEROL SULFATE HFA 108 (90 BASE) MCG/ACT IN AERS
2.0000 | INHALATION_SPRAY | RESPIRATORY_TRACT | Status: DC | PRN
  Filled 2018-12-09: qty 6.7

## 2018-12-09 MED ORDER — ONDANSETRON HCL 4 MG/2ML IJ SOLN: 4.0000 mg | Freq: Four times a day (QID) | INTRAMUSCULAR | Status: DC | PRN

## 2018-12-09 MED ORDER — IPRATROPIUM-ALBUTEROL 20-100 MCG/ACT IN AERS: 1.0000 | INHALATION_SPRAY | Freq: Four times a day (QID) | RESPIRATORY_TRACT | Status: DC

## 2018-12-09 MED ORDER — FUROSEMIDE 20 MG PO TABS
20.0000 mg | ORAL_TABLET | Freq: Two times a day (BID) | ORAL | Status: DC
  Administered 2018-12-10 – 2018-12-12 (×5): 20 mg via ORAL
  Filled 2018-12-09 (×4): qty 1

## 2018-12-09 MED ORDER — MIRTAZAPINE 15 MG PO TABS
15.0000 mg | ORAL_TABLET | Freq: Every day | ORAL | Status: DC
  Administered 2018-12-09 – 2018-12-11 (×3): 15 mg via ORAL
  Filled 2018-12-09 (×3): qty 1

## 2018-12-09 MED ORDER — ASPIRIN EC 81 MG PO TBEC
81.0000 mg | DELAYED_RELEASE_TABLET | Freq: Every day | ORAL | Status: DC
  Administered 2018-12-10 – 2018-12-16 (×7): 81 mg via ORAL
  Filled 2018-12-09 (×7): qty 1

## 2018-12-09 NOTE — ED Notes (Signed)
Date and time results received: 12/09/18 0615 (use smartphrase ".now" to insert current time)  Test: troponin Critical Value: [id number]  Name of Provider Notified: Dr Olevia Bowens  Orders Received? Or Actions Taken?: Actions Taken: no orders received

## 2018-12-09 NOTE — ED Notes (Signed)
CBG - 219 

## 2018-12-09 NOTE — Consult Note (Addendum)
Cardiology Consult    Patient ID: Rebecca [name]; [id number]; 1940-12-30   Admit date: 12/08/2018 Date of Consult: 12/09/2018  Primary Care Provider: [name], MD Primary Cardiologist: Rebecca Docker, MD  Primary Electrophysiologist: Dr. Ladona [name]  Patient Profile    Rebecca [name] is [name] 78 y.o. female with past medical history of chronic diastolic CHF, CHB (s/p Medtronic PPM placement in 2018), persistent atrial fibrillation, recurrent DVT, HTN, HLD, Type 2 DM, mild to moderate AS and moderate to severe MS who is being seen today for the evaluation of elevated troponin values at the request of Rebecca [name].   History of Present Illness    Rebecca [name] was most recently admitted to Rebecca [name] in 09/2018 for [name] CHF exacerbation and initial echo showed EF of 40-45% with hypokinetic apex but views were limited and [name] repeat study with Definity showed EF was at 55-60%. She responded well with Lasix and was continued on  BID at discharge. She had [name] telehealth follow-up visit with Rebecca [name] on 09/15/2018 and reported her breathing was back to baseline. Weight was stable at 238 lbs and she was continued on her current medication regimen. It was unclear if she would be [name] surgical candidate for her valvular heart diease and the possibility of R/LHC was discussed for the future.   She presented to Rebecca [name] ED on 12/08/2018 for evaluation of worsening dyspnea and saturations had been in the 70's on 2L Rebecca [name] upon EMS arrival to her home. She initially required BiPAP and has been recently transitioned to NRB. Unable to have [name] conversation by phone at this time but by review of notes she did not report any chest pain or palpitations at the time of symptom onset. Had been exposed to [name] family member who tested positive for COVID-19.  Initial labs showed WBC 8.3, Hgb 13.4, platelets 231, Na+ 133, K+ 4.6 and creatinine 1.11. COVID positive. UA concerning for UTI and culture pending. D-dimer 1.26. INR 2.1.  BNP 950. Initial HS Troponin 931 with repeat values of 3537 and 5541. CXR shows cardiomegaly with bilateral airspace opacities. EKG shows rate-controlled atrial fibrillation, HR 91 with RBBB and PVC's.   She was initially febrile to 102.9 but temperature has improved to 98.9 on most recent check.  She has been started on Remdesivir along with IV steroids. Telemetry shows rate-controlled atrial fibrillation with rates in the 70's to 80's with occasional PVC's.   Past Medical History:  Diagnosis Date  . Aortic stenosis   . Cataplexy   . Diastolic heart failure (HCC)   . Essential hypertension   . GERD (gastroesophageal reflux disease)   . Hiatal hernia   . History of recurrent deep vein thrombosis (DVT)   . Mitral stenosis   . Narcolepsy   . Neuropathy   . Renal disorder   . Type 2 diabetes mellitus (HCC)     Past Surgical History:  Procedure Laterality Date  . ABDOMINAL HYSTERECTOMY    . CARPAL TUNNEL RELEASE    . CARPECTOMY  12/26/2011   Procedure: CARPECTOMY;  Surgeon: Wyn Forster., MD;  Location: [facility];  Service: Orthopedics;  Laterality: Right;  PROXIMAL ROW CARPECTOMY RIGHT WRIST and neurectomy  . CHOLECYSTECTOMY    . HEMORROIDECTOMY    . KIDNEY SURGERY    . NEPHROSTOMY    . PACEMAKER IMPLANT N/[name] 10/25/2016   Procedure: Pacemaker Implant;  Surgeon: Regan Lemming, MD;  Location: [facility] INVASIVE CV LAB;  Service: Cardiovascular;  Laterality: N/[name];  . TOTAL HIP ARTHROPLASTY     bilateral     Home Medications:  Prior to Admission medications   Medication Sig Start Date End Date Taking? Authorizing Provider  acetaminophen (TYLENOL) 500 MG tablet Take 1,000 mg by mouth 2 (two) times daily.   Yes [provider]  BYDUREON 2 MG PEN Inject 2 mg into the skin once [name] week. 01/03/16  Yes [provider]  carvedilol (COREG) 3.125 MG tablet TAKE 1 TABLET BY MOUTH TWICE DAILY WITH [name] MEAL Patient taking differently: Take 3.125 mg by mouth  2 (two) times daily with [name] meal.  09/09/18  Yes Rebecca LindenKoneswaran, Suresh A, MD  Cholecalciferol (VITAMIN D3) 3000 UNITS TABS Take 1 capsule by mouth daily.   Yes [provider]  clobetasol cream (TEMOVATE) 0.05 % Apply 1 application topically daily.  11/11/18  Yes [provider]  clomiPRAMINE (ANAFRANIL) 25 MG capsule Take 25 mg by mouth 4 (four) times daily.    Yes [provider]  clonazePAM (KLONOPIN) 2 MG tablet Take 2 mg by mouth at bedtime. 11/11/18  Yes [provider]  DULoxetine (CYMBALTA) 60 MG capsule Take 60 mg by mouth daily.     Yes [provider]  ferrous sulfate 325 (65 FE) MG tablet Take 325 mg by mouth 2 (two) times daily with [name] meal.    Yes [provider]  furosemide (LASIX) 40 MG tablet Take 20 mg by mouth 2 (two) times daily.  12/01/14  Yes [provider]  gabapentin (NEURONTIN) 300 MG capsule Take 300 mg by mouth 2 (two) times daily.   Yes [provider]  insulin glargine (LANTUS) 100 unit/mL SOPN Inject 24 Units into the skin daily.    Yes [provider]  metFORMIN (GLUCOPHAGE) 500 MG tablet Take 500 mg by mouth 2 (two) times daily with [name] meal.   Yes [provider]  mirtazapine (REMERON) 15 MG tablet Take 15 mg by mouth at bedtime.     Yes [provider]  Multiple Vitamin (MULTIVITAMIN WITH MINERALS) TABS tablet Take 1 tablet by mouth daily.   Yes [provider]  potassium chloride (K-DUR) 10 MEQ tablet Take 1 tablet (10 mEq total) by mouth daily. Patient taking differently: Take 10 mEq by mouth once.  09/21/14  Yes Rebecca KiefLenze, Michele M, PA-C  QUEtiapine (SEROQUEL) 100 MG tablet Take 100 mg by mouth at bedtime.   Yes [provider]  traMADol (ULTRAM) 50 MG tablet Take 50 mg by mouth 3 (three) times daily. 11/30/18  Yes [provider]  traZODone (DESYREL) 50 MG tablet Take 50 mg by mouth 3 (three) times daily.   Yes [provider]  vitamin C  (ASCORBIC ACID) 500 MG tablet Take 500 mg by mouth daily.   Yes [provider]  warfarin (COUMADIN) 5 MG tablet Take 2.5-5 mg by mouth every evening. Starting 10/29/2016 take 2.5 mg alternating with 5 mg daily    Yes [provider]    Inpatient Medications: Scheduled Meds: . carvedilol  3.125 mg Oral BID WC  . DULoxetine  60 mg Oral Daily  . furosemide  20 mg Oral BID  . gabapentin  300 mg Oral BID  . insulin aspart  0-20 Units Subcutaneous TID WC  . insulin aspart  0-5 Units Subcutaneous QHS  . insulin glargine  20 Units Subcutaneous Daily  . Ipratropium-Albuterol  2 puff Inhalation Q6H  . methylPREDNISolone (SOLU-MEDROL) injection  0.5 mg/kg Intravenous Q12H  .  mirtazapine  15 mg Oral QHS  . vitamin C  500 mg Oral Daily  . zinc sulfate  220 mg Oral Daily   Continuous Infusions:  PRN Meds: acetaminophen **OR** acetaminophen, albuterol, guaiFENesin-dextromethorphan, ondansetron **OR** ondansetron (ZOFRAN) IV  Allergies:    Allergies  Allergen Reactions  . Codeine Anxiety  . Compazine Anaphylaxis  . Other Anaphylaxis, Rash and Other (See Comments)    Uncoded Allergy. Allergen: INOVAR Uncoded Allergy. Allergen: ALL ADHESIVES Uncoded Allergy. Allergen: SILK SUTURE Uncoded Allergy. Allergen: merthiolate Thermasol  . Penicillins Shortness Of Breath and Rash    Has patient had [name] PCN reaction causing immediate rash, facial/tongue/throat swelling, SOB or lightheadedness with hypotension: Yes Has patient had [name] PCN reaction causing severe rash involving mucus membranes or skin necrosis: No Has patient had [name] PCN reaction that required hospitalization No Has patient had [name] PCN reaction occurring within the last 10 years: Yes If all of the above answers are "NO", then may proceed with Cephalosporin use.   Marland Kitchen. Morphine And Related Other (See Comments)    Patient states that it makes her lose her state of mind.   . Demerol [Meperidine] Rash    Social History:   Social  History   Socioeconomic History  . Marital status: Widowed    Spouse name: Not on file  . Number of children: Not on file  . Years of education: 4611  . Highest education level: Not on file  Occupational History    Employer: RETIRED  Social Needs  . Financial resource strain: Not on file  . Food insecurity    Worry: Not on file    Inability: Not on file  . Transportation needs    Medical: Not on file    Non-medical: Not on file  Tobacco Use  . Smoking status: Former Smoker    Quit date: 10/13/1988    Years since quitting: 30.1  . Smokeless tobacco: Never Used  Substance and Sexual Activity  . Alcohol use: No    Alcohol/week: 0.0 standard drinks  . Drug use: No  . Sexual activity: Not on file  Lifestyle  . Physical activity    Days per week: Not on file    Minutes per session: Not on file  . Stress: Not on file  Relationships  . Social Musicianconnections    Talks on phone: Not on file    Gets together: Not on file    Attends religious service: Not on file    Active member of club or organization: Not on file    Attends meetings of clubs or organizations: Not on file    Relationship status: Not on file  . Intimate partner violence    Fear of current or ex partner: Not on file    Emotionally abused: Not on file    Physically abused: Not on file    Forced sexual activity: Not on file  Other Topics Concern  . Not on file  Social History Narrative  . Not on file     Family History:    Family History  Problem Relation Age of Onset  . Heart disease Father   . Diabetes Other   . Stroke Mother   . Scleroderma Brother       Review of Systems    Unable to be obtained as patient currently on NRB and unable to talk on phone.   Physical Exam/Data    Vitals:   12/09/18 0830 12/09/18 0845 12/09/18 0900 12/09/18 0930  BP: (!) 105/91  111/63 (!) 129/95 133/70  Pulse: 62 80 84 82  Resp: 19 (!) 22 (!) 21 20  Temp:      TempSrc:      SpO2: 98% 98% 100% 100%  Weight:       Height:       No intake or output data in the 24 hours ending 12/09/18 0947 Filed Weights   12/08/18 2016  Weight: 108.3 kg   Body mass index is 36.3 kg/[name].   General: Caucasian female appearing in NAD Psych: Normal affect. Neuro: Unable to be assessed.  HEENT: Normal in appearance. NRB in place.  Lungs:  On NRB. Crackles bilaterally by prior assessment.  Heart: Irregularly irregular by review of telemetry.    EKG:  The EKG was personally reviewed and demonstrates: Rate-controlled atrial fibrillation, HR 91 with RBBB and PVC's.   Telemetry:  Telemetry was personally reviewed and demonstrates: Rate-controlled atrial fibrillation with rates in the 70's to 80's with occasional PVC's.    Labs/Studies     Relevant CV Studies:  Echocardiogram: 08/2018 IMPRESSIONS   1. The left ventricle has mild-moderately reduced systolic function, with an ejection fraction of 40-45%. The cavity size was normal. There is mildly increased left ventricular wall thickness. Left ventricular diastolic Doppler parameters are consistent  with impaired relaxation. Elevated mean left atrial pressure.  2. The LV apex is hypokinetic.  3. The right ventricle has normal systolic function. The cavity was normal. There is no increase in right ventricular wall thickness.  4. Left atrial size was severely dilated.  5. Right atrial size was mildly dilated.  6. The aortic valve is tricuspid. Aortic valve regurgitation is mild by color flow Doppler. Mild-moderate stenosis of the aortic valve. Severe aortic annular calcification noted.  7. The mitral valve is abnormal. Moderate thickening of the mitral valve leaflet. Moderate calcification of the mitral valve leaflet. There is severe mitral annular calcification present. Moderate-severe mitral valve stenosis.  8. The aorta is normal in size and structure.  9. The aortic root is normal in size and structure. 10. Pulmonary hypertension is indeterminate, inadequate TR  jet. 11. The apex appears to be newly hypokinetic since 10/23/2016 study with decrease in LVEF. There is some limited visualizaiton in the apical views, would recommend [name] limited contrast study to verify this new findings prior to pursuing additional workup.  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   Limited Echo: 09/07/2018 IMPRESSIONS    1. Stage 1: stage1: Apical anterior segment and apex are abnormal.  2. The left ventricle has normal systolic function, with an ejection fraction of 55-60% (contrast employed). The cavity size was normal. There is moderate concentric left ventricular hypertrophy. Left ventricular diastolic Doppler parameters are  consistent with impaired relaxation.  3. The right ventricle has normal systolc function. The cavity was normal. There is no increase in right ventricular wall thickness.  4. The aortic valve is tricuspid Mild thickening of the aortic valve Mild calcification of the aortic valve. Aortic valve regurgitation was not assessed by color flow Doppler.. Moderate aortic annular calcification noted.  5. The mitral valve is degenerative. Moderate thickening of the mitral valve leaflet. There is severe mitral annular calcification present.  6. The aortic root is normal in size and structure.  7. Left atrial size was severely dilated.  8. Right atrial size was mildly dilated.  9. The interatrial septum was not well visualized. 10. The tricuspid valve was grossly normal. Tricuspid valve regurgitation was not assessed by color  flow Doppler.  Laboratory Data:  Chemistry Recent Labs  Lab 12/08/18 2113 12/09/18 0534  NA 133* 134*  K 4.6 4.4  CL 97* 98  CO2 21* 22  GLUCOSE 213* 261*  BUN 26* 34*  CREATININE 1.11* 1.21*  CALCIUM 8.6* 8.6*  GFRNONAA 47* 43*  GFRAA 55* 49*  ANIONGAP 15 14    Recent Labs  Lab 12/08/18 2113 12/09/18 0534  PROT 7.7 7.6  ALBUMIN 3.8 3.6  AST 29 47*  ALT 21 23  ALKPHOS 54 50   BILITOT 0.8 0.6   Hematology Recent Labs  Lab 12/08/18 2130 12/09/18 0534  WBC 8.3 7.6  RBC 4.45 4.32  HGB 13.4 13.1  HCT 42.2 41.0  MCV 94.8 94.9  MCH 30.1 30.3  MCHC 31.8 32.0  RDW 15.7* 15.5  PLT 231 227   Cardiac EnzymesNo results for input(s): TROPONINI in the last 168 hours. No results for input(s): TROPIPOC in the last 168 hours.  BNP Recent Labs  Lab 12/08/18 2130  BNP 950.0*    DDimer  Recent Labs  Lab 12/08/18 2113 12/09/18 0534  DDIMER 1.26* 1.30*    Radiology/Studies:  Dg Chest Port 1 View  Result Date: 12/08/2018 CLINICAL DATA:  Shortness of breath for 2 days. EXAM: PORTABLE CHEST 1 VIEW COMPARISON:  Chest radiograph 09/03/2018 and CT 09/04/2018 FINDINGS: [name] dual lead pacemaker remains in place. The cardiac silhouette remains enlarged. Hazy airspace opacity is again seen in both lungs, slightly worse than on the prior study. The interstitial markings are mildly increased diffusely. No sizable pleural effusion or pneumothorax is identified. No acute osseous abnormality is seen. Aortic atherosclerosis is noted. IMPRESSION: Cardiomegaly with bilateral airspace opacities which are mildly increased compared to 09/03/2018 and may reflect edema or infection. Electronically Signed   By: Sebastian Ache [name].D.   On: 12/08/2018 20:56     Assessment & Plan    1. Elevated Troponin Values - She is currently admitted for acute on chronic hypoxic respiratory failure in the setting of COVID-19 and PNA. She is resting comfortably on NRB and by review of notes she did not report chest pain or palpitations at the time of admission. -  Initial HS Troponin 931 with repeat values of 3537 and 5541. EKG shows rate-controlled atrial fibrillation, HR 91 with RBBB and PVC's.  - Would plan to obtain [name] limited echocardiogram to assess EF and wall motion as this was preserved by most recent study in 09/2018. Start Heparin once INR is less than 2.0 and continue for 48 hours. Continue BB therapy.  Will start ASA 81mg  daily. Not on statin prior to admission. Will check FLP. LFT's slightly elevated but not prohibitive (AST 47 and ALT 23 this AM).   2. Chronic Diastolic CHF - BNP elevated to 950 on admission. Received IV Lasix yesterday and creatinine increased from 1.11 to 1.21. Agree with switching back to PO regimen and monitoring I&O's along with daily weights. If unable to take PO's later today due ro NRB or Bi-PAP requirement, would dose IV Lasix 40mg  later today.   3. CHB  - s/p Medtronic PPM placement in 2018. Followed by Dr. Ladona [name] and device interrogation in 10/2018 showed normal device function.   4. Persistent Atrial Fibrillation - Rates are currently well controlled in the 70's to 80's. Continue PTA Coreg 3.125 mg twice daily for rate control. She is on Coumadin for anticoagulation in setting of atrial fibrillation and history of DVTs.  Will hold Coumadin for now and start Heparin  as outlined above once INR less than 2.0.  5. Valvular Heart Disease - most recent echo in 09/2018 showed mild to moderate AS and moderate to severe MS. By review of notes from her Primary Cardiologist, it was unclear if she would be [name] surgical candidate but the possibility of R/LHC in the future was discussed. No plans for invasive evaluation this admission.   6. COVID-19/PNA - She has been started on Remdesivir along with IV steroids. She is being transferred to [facility] for further management. - per admitting team   For questions or updates, please contact [place] Please consult [email or web address] for contact info under Cardiology/STEMI.  Signed, Erma Heritage, PA-C 12/09/2018, 9:47 AM Pager: [phone]    Attending note:  Records reviewed and case discussed with Ms. Delano Metz, I agree with her above findings and recommendations.  Ms. Zenz is [name] patient of Dr. Bronson Ing with history of Medtronic pacemaker for treatment of complete heart block, persistent atrial fibrillation,  mild to moderate aortic stenosis, moderate to severe mitral stenosis, and chronic diastolic heart failure.  She presents to the ER with worsening shortness of breath and hypoxia, has been diagnosed with SARS coronavirus 2 and is pending transfer to [facility].  Due to respiratory distress and under current respiratory isolation, we were unable to discuss symptoms actively with the patient, she was being transitioned to BiPAP.  Reportedly, she had an exposure to [name] COVID-19 positive family member.  In the course of her work-up she has been found to have elevated high-sensitivity troponin I levels increasing from 931 up to 5541 with ECG showing rate controlled atrial fibrillation and no acute ST segment changes.  She did not describe chest pain at presentation based on discussion with staff.  She is currently afebrile with presenting temperature 102.9 degrees.  She did not appear to be in acute distress on nonrebreather with transition being made to BiPAP.  Crackles present based on previous clinical assessment and heart rate irregularly irregular.  Lab work shows potassium 4.4, BUN 34, creatinine 1.21, BNP 950, CRP 6.9, lactic acid 1.1, procalcitonin less than 0.10, hemoglobin 13.1, platelets 227, D-dimer 1.30 with INR 2.1 on Coumadin.  Chest x-ray reports cardiomegaly with bilateral airspace opacities that are more prominent in comparison to prior film in July.  Patient presenting with hypoxic respiratory failure in the setting of SARS coronavirus 2 with pneumonia.  No definite chest pain reported at presentation and ECG without acute ST changes, but high-sensitivity troponin I level increased to 5541 in range of NSTEMI type II (likely demand ischemia rather than plaque rupture event).  She is in the process of being transferred to [facility] for further management.  Would get [name] follow-up limited echocardiogram to evaluate LVEF in comparison to the most recent study done in August.   Transition from Coumadin to heparin and as long as blood pressure tolerates would continue beta-blocker therapy and initiate aspirin.  Heart rate control is reasonable at this time.  Continue supportive measures and can then determine if further cardiac assessment is warranted.  She does have significant valvular heart disease and it was unclear that this was going to be pursued via invasive cardiac evaluation based on her last assessment by Dr. Bronson Ing.  Satira Sark, [name].D., F.[name].C.C.

## 2018-12-09 NOTE — ED Notes (Signed)
CRITICAL VALUE ALERT  Critical Value:  troponin 6073  Date & Time Notied:  12/09/2018 @ 3546  Provider Notified: Dr Tat  Orders Received/Actions taken: see new orders.

## 2018-12-09 NOTE — ED Notes (Signed)
Lab at bedside

## 2018-12-09 NOTE — ED Notes (Signed)
Dr Tat at bedside 

## 2018-12-09 NOTE — ED Notes (Signed)
Pt's diaper, bed pads, and purewick changed. Pt repositioned in bed.

## 2018-12-09 NOTE — Progress Notes (Signed)
Remdesivir - Pharmacy Brief Note   O:  ALT: 23 CXR: Cardiomegaly with bilateral airspace opacities which are mildly increased compared. to 09/03/2018 and may reflect edema or infection. SpO2: 98% on 4L [place].   A/P:  Remdesivir 200 mg once followed by 100 mg daily x 4 days.   Gretta Arab PharmD, BCPS Clinical pharmacist phone 7am- 5pm: [phone] 12/09/2018 7:36 AM

## 2018-12-09 NOTE — Progress Notes (Signed)
PROGRESS NOTE  Rebecca [name] QIH:[id number] DOB: 07/10/1940 DOA: 12/08/2018 PCP: Lucia Gaskins, MD  Brief History:  78 y/o female with a history of diastolic CHF, aortic stenosis, mitral stenosis, CKD stage III, hypertension, recurrent lower extremity DVT, diabetes mellitus, complete heart block status post permanent pacemaker presenting with 2-day history of shortness of breath, fevers, and coughing.  She states that her son was diagnosed with COVID-19 approximately 1 week ago.  She denies any hemoptysis, nausea, vomiting, diarrhea, abdominal pain, headache, neck pain.  She does not weigh herself.  She endorses compliance with her diuretics and other cardiac medications.  The patient was recently hospitalized from 09/03/2018 to 09/08/2018 with acute on chronic diastolic CHF with EF 55 to 60% and pneumonia.  She was discharged home with furosemide 20 mg twice daily.  She was discharged home on 2 L nasal cannula oxygen upon which she remains.   Upon EMS arrival, the patient was noted to have oxygen saturation of 70% 2 L.  She was subsequently placed on nonrebreather and brought to the emergency department.  Initial WBC 8.3 with BNP 950.  Troponin levels have gradually increased from 931>>> 3537>>> 5541.  SARS-CoV2 was positive.  The patient was started on steroids and remdesivir.  INR was 2.1.  Cardiology was consulted to assist with management.  Assessment/Plan: Acute on chronic respiratory failure with hypoxia -Secondary to COVID pneumonia, but cannot rule out degree of pulmonary edema -Personally reviewed chest x-ray--increased bilateral interstitial markings and opacities -Presently stable on NRB--->SaO2 100%, no increase WOB -Wean oxygen as tolerated  COVID-19 Pneumonia -continue IV solumedrol -start remdesivir -daily CRP, D-dimer, ferritin -12/08/18 PCT <0.10 -12/08/18 CRP 6.9 -12/08/18 LDH 184 -12/08/18 ferritin 179 -12/08/18 D-dimer 1.26  Elevated troponin -Troponins as noted  above -Repeat echocardiogram -Cardiology consult--spoke with Dr. Domenic Polite -INR is presently therapeutic at 2.1 -personally reviewed EKG--sinus, LBBB  chronic diastolic CHF -8/75/6433 echo EF 40-45%, HK LV apex,severe LAE, moderate to severeMS,mild to moderateAS -09/07/18-repeat limited echo EF 55-60%, impaired relaxation -redose intravenous furosemide -Daily weights -12/15/2017 office weight 235 pounds -09/07/18 discharge weight 238.7 lbs -continue carvedilol  Uncontrolled diabetes mellitus type 2 with hyperglycemia -09/04/18 Hemoglobin A1c 9.2 -Suspect a degree of dietary indiscretion at home as the patient CBGs are well controlled during last hospitalization -Start Lantus 20 units daily -Continue NovoLog sliding scale -anticipate elevated CBGs due to steroids  CKD stage III -Baseline creatinine 1.0-1.2 -Monitor clinically with diuresis  Complete heart block -Status post PPM -personally reviewed EKG--paced rhythm  Hyponatremia -Secondary to fluid overload -Improving with furosemide  Recurrent DVT -Continue warfarin  Depression -continue Cymbalta    Disposition Plan:   Transfer to [facility] Family Communication:   No Family at bedside  Consultants: cardiology   Code Status:  FULL   DVT Prophylaxis: warfarin   Procedures: As Listed in Progress Note Above  Antibiotics: None       Subjective: Patient states that her breathing is better than yesterday.  She denies any headache, neck pain, fever, chills, cough, hemoptysis, nausea, vomiting, diarrhea, abdominal pain, dysuria, hematuria.  Objective: Vitals:   12/09/18 0630 12/09/18 0700 12/09/18 0715 12/09/18 0745  BP: 119/60 134/64 124/68 (!) 125/58  Pulse: 77 74 67 70  Resp: (!) 24 (!) 23 (!) 24 (!) 22  Temp:      TempSrc:      SpO2: 96% 100% 98% 100%  Weight:      Height:  No intake or output data in the 24 hours ending 12/09/18 0808 Weight change:  Exam:   General:  Pt is  alert, follows commands appropriately, not in acute distress  HEENT: No icterus, No thrush, No neck mass, [place]/AT  Cardiovascular: RRR, S1/S2, no rubs, no gallops  Respiratory: Bilateral crackles but no wheezing.  Good air movement.  Abdomen: Soft/+BS, non tender, non distended, no guarding  Extremities: No edema, No lymphangitis, No petechiae, No rashes, no synovitis   Data Reviewed: I have personally reviewed following labs and imaging studies Basic Metabolic Panel: Recent Labs  Lab 12/08/18 2113 12/08/18 2346 12/09/18 0534  NA 133*  --  134*  K 4.6  --  4.4  CL 97*  --  98  CO2 21*  --  22  GLUCOSE 213*  --  261*  BUN 26*  --  34*  CREATININE 1.11*  --  1.21*  CALCIUM 8.6*  --  8.6*  MG  --  2.2  --   PHOS  --  4.6  --    Liver Function Tests: Recent Labs  Lab 12/08/18 2113 12/09/18 0534  AST 29 47*  ALT 21 23  ALKPHOS 54 50  BILITOT 0.8 0.6  PROT 7.7 7.6  ALBUMIN 3.8 3.6   No results for input(s): LIPASE, AMYLASE in the last 168 hours. No results for input(s): AMMONIA in the last 168 hours. Coagulation Profile: Recent Labs  Lab 12/09/18 0534  INR 2.1*   CBC: Recent Labs  Lab 12/08/18 2130 12/09/18 0534  WBC 8.3 7.6  NEUTROABS 7.0 7.0  HGB 13.4 13.1  HCT 42.2 41.0  MCV 94.8 94.9  PLT 231 227   Cardiac Enzymes: No results for input(s): CKTOTAL, CKMB, CKMBINDEX, TROPONINI in the last 168 hours. BNP: Invalid input(s): POCBNP CBG: No results for input(s): GLUCAP in the last 168 hours. HbA1C: No results for input(s): HGBA1C in the last 72 hours. Urine analysis:    Component Value Date/Time   COLORURINE AMBER (A) 12/08/2018 2046   APPEARANCEUR CLOUDY (A) 12/08/2018 2046   LABSPEC 1.019 12/08/2018 2046   PHURINE 5.0 12/08/2018 2046   GLUCOSEU NEGATIVE 12/08/2018 2046   HGBUR NEGATIVE 12/08/2018 2046   BILIRUBINUR NEGATIVE 12/08/2018 2046   KETONESUR 5 (A) 12/08/2018 2046   PROTEINUR 30 (A) 12/08/2018 2046   UROBILINOGEN 0.2 09/05/2014 2015    NITRITE POSITIVE (A) 12/08/2018 2046   LEUKOCYTESUR TRACE (A) 12/08/2018 2046   Sepsis Labs: @LABRCNTIP (procalcitonin:4,lacticidven:4) ) Recent Results (from the past 240 hour(s))  SARS Coronavirus 2 by RT PCR (hospital order, performed in [facility] hospital lab) Nasopharyngeal Nasopharyngeal Swab     Status: Abnormal   Collection Time: 12/08/18  8:12 PM   Specimen: Nasopharyngeal Swab  Result Value Ref Range Status   SARS Coronavirus 2 POSITIVE (A) NEGATIVE Final    Comment: RESULT CALLED TO, READ BACK BY AND VERIFIED WITH: BELTON,K @ 2155 ON 12/08/18 BY JUW Performed at [facility], [address], [place], [place] [address]   Blood Culture (routine x 2)     Status: None (Preliminary result)   Collection Time: 12/08/18  9:13 PM   Specimen: BLOOD RIGHT ARM  Result Value Ref Range Status   Specimen Description BLOOD RIGHT ARM  Final   Special Requests   Final    BOTTLES DRAWN AEROBIC AND ANAEROBIC Blood Culture adequate volume Performed at [facility], [address], [place], [place] [address]    Culture PENDING  Incomplete   Report Status PENDING  Incomplete  Blood Culture (routine x 2)     Status: None (Preliminary result)   Collection Time: 12/08/18  9:30 PM   Specimen: BLOOD RIGHT HAND  Result Value Ref Range Status   Specimen Description BLOOD RIGHT HAND  Final   Special Requests   Final    BOTTLES DRAWN AEROBIC ONLY Blood Culture adequate volume Performed at [facility], [address], [place], [place] [address]    Culture PENDING  Incomplete   Report Status PENDING  Incomplete     Scheduled Meds: . insulin aspart  0-20 Units Subcutaneous TID WC  . insulin aspart  0-5 Units Subcutaneous QHS  . Ipratropium-Albuterol  2 puff Inhalation Q6H  . methylPREDNISolone (SOLU-MEDROL) injection  0.5 mg/kg Intravenous Q12H  . vitamin C  500 mg Oral Daily  . zinc sulfate  220 mg Oral Daily   Continuous Infusions:  Procedures/Studies: Dg Chest Port 1 View  Result  Date: 12/08/2018 CLINICAL DATA:  Shortness of breath for 2 days. EXAM: PORTABLE CHEST 1 VIEW COMPARISON:  Chest radiograph 09/03/2018 and CT 09/04/2018 FINDINGS: A dual lead pacemaker remains in place. The cardiac silhouette remains enlarged. Hazy airspace opacity is again seen in both lungs, slightly worse than on the prior study. The interstitial markings are mildly increased diffusely. No sizable pleural effusion or pneumothorax is identified. No acute osseous abnormality is seen. Aortic atherosclerosis is noted. IMPRESSION: Cardiomegaly with bilateral airspace opacities which are mildly increased compared to 09/03/2018 and may reflect edema or infection. Electronically Signed   By: Sebastian Ache M.D.   On: 12/08/2018 20:56    Catarina Hartshorn, DO  [facility] Pager [phone]  If 7PM-7AM, please contact night-coverage [email or web address] Password [email or web address] 12/09/2018, 8:08 AM   LOS: 1 day

## 2018-12-09 NOTE — Progress Notes (Signed)
*  PRELIMINARY RESULTS* Echocardiogram 2D Echocardiogram limited has been performed.  Leavy Cella 12/09/2018, 4:28 PM

## 2018-12-09 NOTE — ED Notes (Signed)
Date and time results received: 12/09/18 0030 (use smartphrase ".now" to insert current time)  Test: troponin Critical Value: 3537  Name of Provider Notified: Dr Lavell Islam  Orders Received? Or Actions Taken?: Actions Taken: no orders received

## 2018-12-09 NOTE — Progress Notes (Signed)
ANTICOAGULATION CONSULT NOTE - Initial Consult  Pharmacy Consult for heparin Indication: chest pain/ACS/NSTEMI  Allergies  Allergen Reactions  . Codeine Anxiety  . Compazine Anaphylaxis  . Other Anaphylaxis, Rash and Other (See Comments)    Uncoded Allergy. Allergen: INOVAR Uncoded Allergy. Allergen: ALL ADHESIVES Uncoded Allergy. Allergen: SILK SUTURE Uncoded Allergy. Allergen: merthiolate Thermasol  . Penicillins Shortness Of Breath and Rash    Has patient had a PCN reaction causing immediate rash, facial/tongue/throat swelling, SOB or lightheadedness with hypotension: Yes Has patient had a PCN reaction causing severe rash involving mucus membranes or skin necrosis: No Has patient had a PCN reaction that required hospitalization No Has patient had a PCN reaction occurring within the last 10 years: Yes If all of the above answers are "NO", then may proceed with Cephalosporin use.   Marland Kitchen Morphine And Related Other (See Comments)    Patient states that it makes her lose her state of mind.   . Demerol [Meperidine] Rash    Patient Measurements: Height: 5\' 8"  (172.7 cm) Weight: 238 lb 12.1 oz (108.3 kg) IBW/kg (Calculated) : 63.9 Heparin Dosing Weight: 88 kg  Vital Signs: Temp: 98.8 F (37.1 C) (11/04 1623) Temp Source: Oral (11/04 1623) BP: 132/98 (11/04 1623) Pulse Rate: 77 (11/04 1623)  Labs: Recent Labs    12/08/18 2113 12/08/18 2130 12/08/18 2346 12/09/18 0534 12/09/18 1050 12/09/18 1514  HGB  --  13.4  --  13.1  --   --   HCT  --  42.2  --  41.0  --   --   PLT  --  231  --  227  --   --   LABPROT  --   --   --  23.2*  --  23.2*  INR  --   --   --  2.1*  --  2.1*  CREATININE 1.11*  --   --  1.21*  --   --   TROPONINIHS 931*  --  3,537* 5,541* 6,073*  --     Estimated Creatinine Clearance: 48.6 mL/min (A) (by C-G formula based on SCr of 1.21 mg/dL (H)).   Medical History: Past Medical History:  Diagnosis Date  . Aortic stenosis   . Cataplexy   .  Diastolic heart failure ([place])   . Essential hypertension   . GERD (gastroesophageal reflux disease)   . Hiatal hernia   . History of recurrent deep vein thrombosis (DVT)   . Mitral stenosis   . Narcolepsy   . Neuropathy   . Renal disorder   . Type 2 diabetes mellitus (HCC)     Medications:  (Not in a hospital admission)   Assessment: Pharmacy consulted to dose heparin in patient with ACS/NSTEMI.  Patient's troponin level currently 6,073.  Patient is on warfarin prior to admission with current INR at 2.1.  Goal of Therapy:  Heparin level 0.3-0.7 units/ml Monitor platelets by anticoagulation protocol: Yes   Plan:  Repeat INR with AM labs. Start heparin infusion when INR < 2.  Revonda Standard Bayley [place] 12/09/2018,4:45 PM

## 2018-12-09 NOTE — ED Notes (Signed)
Removed pt off of NRB and placed on 6L nasal cannula around 1630. Pt tolerating well at this time. Sats 97% on 6L.

## 2018-12-09 NOTE — ED Notes (Signed)
CBG 163 

## 2018-12-10 DIAGNOSIS — I5032 Chronic diastolic (congestive) heart failure: Secondary | ICD-10-CM

## 2018-12-10 DIAGNOSIS — R778 Other specified abnormalities of plasma proteins: Secondary | ICD-10-CM

## 2018-12-10 LAB — CBG MONITORING, ED: Glucose-Capillary: 149 mg/dL — ABNORMAL HIGH (ref 70–99)

## 2018-12-10 LAB — GLUCOSE, CAPILLARY
Glucose-Capillary: 161 mg/dL — ABNORMAL HIGH (ref 70–99)
Glucose-Capillary: 141 mg/dL — ABNORMAL HIGH (ref 70–99)
Glucose-Capillary: 205 mg/dL — ABNORMAL HIGH (ref 70–99)
Glucose-Capillary: 275 mg/dL — ABNORMAL HIGH (ref 70–99)

## 2018-12-10 LAB — PROTIME-INR
INR: 2.1 — ABNORMAL HIGH (ref 0.8–1.2)
Prothrombin Time: 22.8 seconds — ABNORMAL HIGH (ref 11.4–15.2)

## 2018-12-10 LAB — MRSA PCR SCREENING: MRSA by PCR: NEGATIVE

## 2018-12-10 MED ORDER — PNEUMOCOCCAL VAC POLYVALENT 25 MCG/0.5ML IJ INJ
0.5000 mL | INJECTION | INTRAMUSCULAR | Status: AC
  Administered 2018-12-16: 0.5 mL via INTRAMUSCULAR
  Filled 2018-12-10: qty 0.5

## 2018-12-10 MED ORDER — INFLUENZA VAC A&B SA ADJ QUAD 0.5 ML IM PRSY
0.5000 mL | PREFILLED_SYRINGE | INTRAMUSCULAR | Status: DC
  Filled 2018-12-10: qty 0.5

## 2018-12-10 MED ORDER — WARFARIN - PHARMACIST DOSING INPATIENT
Freq: Every day | Status: DC
  Administered 2018-12-10: 18:00:00

## 2018-12-10 MED ORDER — WARFARIN SODIUM 5 MG PO TABS
5.0000 mg | ORAL_TABLET | Freq: Once | ORAL | Status: AC
  Administered 2018-12-10: 5 mg via ORAL
  Filled 2018-12-10: qty 1

## 2018-12-10 MED ORDER — LEVOFLOXACIN IN D5W 500 MG/100ML IV SOLN
500.0000 mg | INTRAVENOUS | Status: DC
  Administered 2018-12-10 – 2018-12-12 (×3): 500 mg via INTRAVENOUS
  Filled 2018-12-10 (×4): qty 100

## 2018-12-10 MED ORDER — DEXAMETHASONE SODIUM PHOSPHATE 10 MG/ML IJ SOLN
6.0000 mg | INTRAMUSCULAR | Status: DC
  Administered 2018-12-11 – 2018-12-13 (×3): 6 mg via INTRAVENOUS
  Filled 2018-12-10 (×3): qty 1

## 2018-12-10 NOTE — Progress Notes (Signed)
PROGRESS NOTE    Rebecca [name]  EBX:[id number] DOB: 01-10-1941 DOA: 12/08/2018 PCP: [name], MD    Brief Narrative:  78 y/o female with a history of diastolic CHF, aortic stenosis, mitral stenosis, CKD stage III, hypertension, recurrent lower extremity DVT, diabetes mellitus, complete heart block status post permanent pacemaker presenting with 2-day history of shortness of breath, fevers, and coughing.  She states that her son was diagnosed with COVID-19 approximately 1 week ago.  She denies any hemoptysis, nausea, vomiting, diarrhea, abdominal pain, headache, neck pain.  She does not weigh herself.  She endorses compliance with her diuretics and other cardiac medications.  The patient was recently hospitalized from 09/03/2018 to 09/08/2018 with acute on chronic diastolic CHF with EF 55 to 60% and pneumonia.  She was discharged home with furosemide 20 mg twice daily.  She was discharged home on 2 L nasal cannula oxygen upon which she remains.   Upon EMS arrival, the patient was noted to have oxygen saturation of 70% 2 L.  She was subsequently placed on nonrebreather and brought to the emergency department.  Initial WBC 8.3 with BNP 950.  Troponin levels have gradually increased from 931>>> 3537>>> 5541.  SARS-CoV2 was positive.  The patient was started on steroids and remdesivir.  INR was 2.1.  Cardiology was consulted to assist with management.   Assessment & Plan:   Principal Problem:   Pneumonia due to COVID-19 virus Active Problems:   GERD (gastroesophageal reflux disease)   IDA (iron deficiency anemia)   NAFLD (nonalcoholic fatty liver disease)   Elevated troponin   Essential hypertension   Depression   Hyponatremia   Chronic diastolic CHF (congestive heart failure) (HCC)   CKD (chronic kidney disease), stage III   History of recurrent deep vein thrombosis (DVT)   Type 2 diabetes mellitus (HCC)   Acute on chronic respiratory failure (HCC)  Acute on chronic respiratory  failure with hypoxia -Secondary to COVID pneumonia -CXR findings reviewed, findings of cardiomegaly with B airspace opacities may reflect edema or infection -Currently stable on 4LNC -Cont to wean oxygen as tolerated -Was started on solumedrol and remdesivir. This AM, pt noted to appear flushed shortly after receiving IV steroid. Will transition to dexamethasone. No complaints of itching  COVID-19 Pneumonia -continue IV dexamethasone and remdesivir -cont to follow inflammatory markers -Currently on 4LNC  Elevated troponin -Troponins rising, peaking to 6,073 -Repeat echocardiogram reviewed with EF 45-50% -Initial plan for transition from coumadin to heparin gtt given elevated troponin -discussed with Dr. Tresa Endo today. No plan for cardiac intervention, thus OK for tx to [facility]. On discussion with Cardiology later this afternoon, OK to d/c heparin order and resume therapeutic coumadin per pharmacy dosing -INR remains therapeutic at 2.1 today  chronic diastolic CHF -09/04/2018 echo EF 40-45%, HK LV apex,severe LAE, moderate to severeMS,mild to moderateAS -09/07/18-repeat limited echo EF 55-60%, impaired relaxation -Now on PO lasix -Daily weights -continue carvedilol as tolerated  Uncontrolled diabetes mellitus type 2 with hyperglycemia -09/04/18 Hemoglobin A1c 9.2 -Suspect a degree of dietary indiscretion at home as the patient CBGs are well controlled during last hospitalization -Started Lantus 20 units daily -Continue NovoLog sliding scale -glucose trends appear stable  CKD stage III -Baseline creatinine 1.0-1.2 -Monitor clinically with diuresis -Repeat bmet in AM  Complete heart block -Status post PPM -stable at this time  Hyponatremia -Secondary to fluid overload -Improved with diuresis  Recurrent DVT -Continue warfarin per pharmacy dosing per above -No evidence of acute bleeding  Depression -continue Cymbalta -  Seems stable at this time  DVT prophylaxis:  Coumadin Code Status: Full Family Communication: pt in room, family not at bedside Disposition Plan: Uncertain at this time  Consultants:   Cardiology  Procedures:     Antimicrobials: Anti-infectives (From admission, onward)   Start     Dose/Rate Route Frequency Ordered Stop   12/10/18 1600  remdesivir 100 mg in sodium chloride 0.9 % 250 mL IVPB     100 mg 500 mL/hr over 30 Minutes Intravenous Every 24 hours 12/09/18 1431 12/14/18 1559   12/10/18 1400  levofloxacin (LEVAQUIN) IVPB 500 mg     500 mg 100 mL/hr over 60 Minutes Intravenous Every 24 hours 12/10/18 1308     12/09/18 1600  remdesivir 200 mg in sodium chloride 0.9 % 250 mL IVPB     200 mg 500 mL/hr over 30 Minutes Intravenous Once 12/09/18 1431 12/09/18 1658       Subjective: Without complaints this AM. Denies feeling sob  Objective: Vitals:   12/10/18 0813 12/10/18 1008 12/10/18 1024 12/10/18 1100  BP: 125/75 (!) 131/58  115/82  Pulse: 76 87  (!) 56  Resp: 19 (!) 22  19  Temp: 97.6 F (36.4 C) (!) 97 F (36.1 C) 97.7 F (36.5 C) 98 F (36.7 C)  TempSrc: Oral Oral Oral Oral  SpO2: 97% 94%  93%  Weight:      Height:        Intake/Output Summary (Last 24 hours) at 12/10/2018 1547 Last data filed at 12/10/2018 0900 Gross per 24 hour  Intake 252.02 ml  Output 1100 ml  Net -847.98 ml   Filed Weights   12/08/18 2016  Weight: 108.3 kg    Examination:  General exam: Appears calm and comfortable  Respiratory system: No audible wheezing. Respiratory effort normal. Cardiovascular system: S1 & S2 heard, RRR Gastrointestinal system: obese, nondistended Central nervous system: Alert and oriented. No focal neurological deficits. Extremities: Symmetric 5 x 5 power. Skin: No rashes, lesions Psychiatry: Judgement and insight appear normal. Mood & affect appropriate.   Data Reviewed: I have personally reviewed following labs and imaging studies  CBC: Recent Labs  Lab 12/08/18 2130 12/09/18 0534  WBC  8.3 7.6  NEUTROABS 7.0 7.0  HGB 13.4 13.1  HCT 42.2 41.0  MCV 94.8 94.9  PLT 231 227   Basic Metabolic Panel: Recent Labs  Lab 12/08/18 2113 12/08/18 2346 12/09/18 0534  NA 133*  --  134*  K 4.6  --  4.4  CL 97*  --  98  CO2 21*  --  22  GLUCOSE 213*  --  261*  BUN 26*  --  34*  CREATININE 1.11*  --  1.21*  CALCIUM 8.6*  --  8.6*  MG  --  2.2  --   PHOS  --  4.6  --    GFR: Estimated Creatinine Clearance: 48.6 mL/min (A) (by C-G formula based on SCr of 1.21 mg/dL (H)). Liver Function Tests: Recent Labs  Lab 12/08/18 2113 12/09/18 0534  AST 29 47*  ALT 21 23  ALKPHOS 54 50  BILITOT 0.8 0.6  PROT 7.7 7.6  ALBUMIN 3.8 3.6   No results for input(s): LIPASE, AMYLASE in the last 168 hours. No results for input(s): AMMONIA in the last 168 hours. Coagulation Profile: Recent Labs  Lab 12/09/18 0534 12/09/18 1514 12/10/18 1139  INR 2.1* 2.1* 2.1*   Cardiac Enzymes: No results for input(s): CKTOTAL, CKMB, CKMBINDEX, TROPONINI in the last 168 hours. BNP (last  3 results) No results for input(s): PROBNP in the last 8760 hours. HbA1C: No results for input(s): HGBA1C in the last 72 hours. CBG: Recent Labs  Lab 12/09/18 1714 12/09/18 2122 12/09/18 2208 12/10/18 0809 12/10/18 1141  GLUCAP 163* 149* 145* 161* 141*   Lipid Profile: Recent Labs    12/08/18 2113  TRIG 75   Thyroid Function Tests: No results for input(s): TSH, T4TOTAL, FREET4, T3FREE, THYROIDAB in the last 72 hours. Anemia Panel: Recent Labs    12/08/18 2113  FERRITIN 179   Sepsis Labs: Recent Labs  Lab 12/08/18 2113 12/08/18 2207 12/08/18 2346  PROCALCITON <0.10  --   --   LATICACIDVEN  --  1.2 1.1    Recent Results (from the past 240 hour(s))  SARS Coronavirus 2 by RT PCR (hospital order, performed in [facility] hospital lab) Nasopharyngeal Nasopharyngeal Swab     Status: Abnormal   Collection Time: 12/08/18  8:12 PM   Specimen: Nasopharyngeal Swab  Result Value Ref Range Status    SARS Coronavirus 2 POSITIVE (A) NEGATIVE Final    Comment: RESULT CALLED TO, READ BACK BY AND VERIFIED WITH: BELTON,K @ 2155 ON 12/08/18 BY JUW Performed at [facility], [address] [place], [place] [address]   Urine culture     Status: Abnormal (Preliminary result)   Collection Time: 12/08/18  8:46 PM   Specimen: Urine, Catheterized  Result Value Ref Range Status   Specimen Description   Final    URINE, CATHETERIZED Performed at [facility], [address] [place], [place] [address]    Special Requests   Final    NONE Performed at [facility], [address], [place], [place] [address]    Culture (A)  Final    >=100,000 COLONIES/mL ESCHERICHIA COLI SUSCEPTIBILITIES TO FOLLOW CULTURE REINCUBATED FOR BETTER GROWTH Performed at [facility], [address] [place], [place] [address]    Report Status PENDING  Incomplete  Blood Culture (routine x 2)     Status: None (Preliminary result)   Collection Time: 12/08/18  9:13 PM   Specimen: BLOOD RIGHT ARM  Result Value Ref Range Status   Specimen Description BLOOD RIGHT ARM  Final   Special Requests   Final    BOTTLES DRAWN AEROBIC AND ANAEROBIC Blood Culture adequate volume   Culture   Final    NO GROWTH 2 DAYS Performed at [facility], [address] [place], [place] [address]    Report Status PENDING  Incomplete  Blood Culture (routine x 2)     Status: None (Preliminary result)   Collection Time: 12/08/18  9:30 PM   Specimen: BLOOD RIGHT HAND  Result Value Ref Range Status   Specimen Description BLOOD RIGHT HAND  Final   Special Requests   Final    BOTTLES DRAWN AEROBIC ONLY Blood Culture adequate volume   Culture   Final    NO GROWTH 2 DAYS Performed at [facility], [address] [place], [place] [address]    Report Status PENDING  Incomplete  MRSA PCR Screening     Status: None   Collection Time: 12/10/18  6:38 AM   Specimen: Nasopharyngeal  Result Value Ref Range Status   MRSA by PCR NEGATIVE NEGATIVE  Final    Comment:        The GeneXpert MRSA Assay (FDA approved for NASAL specimens only), is one component of a comprehensive MRSA colonization surveillance program. It is not intended to diagnose MRSA infection nor to guide  or monitor treatment for MRSA infections. Performed at [facility], [place] [address] [place], [place] [address]      Radiology Studies: Dg Chest Port 1 View  Result Date: 12/08/2018 CLINICAL DATA:  Shortness of breath for 2 days. EXAM: PORTABLE CHEST 1 VIEW COMPARISON:  Chest radiograph 09/03/2018 and CT 09/04/2018 FINDINGS: A dual lead pacemaker remains in place. The cardiac silhouette remains enlarged. Hazy airspace opacity is again seen in both lungs, slightly worse than on the prior study. The interstitial markings are mildly increased diffusely. No sizable pleural effusion or pneumothorax is identified. No acute osseous abnormality is seen. Aortic atherosclerosis is noted. IMPRESSION: Cardiomegaly with bilateral airspace opacities which are mildly increased compared to 09/03/2018 and may reflect edema or infection. Electronically Signed   By: Logan Bores M.D.   On: 12/08/2018 20:56    Scheduled Meds: . aspirin EC  81 mg Oral Daily  . carvedilol  3.125 mg Oral BID WC  . [START ON 12/11/2018] dexamethasone (DECADRON) injection  6 mg Intravenous Q24H  . DULoxetine  60 mg Oral Daily  . furosemide  20 mg Oral BID  . gabapentin  300 mg Oral BID  . [START ON 12/11/2018] influenza vaccine adjuvanted  0.5 mL Intramuscular Tomorrow-1000  . insulin aspart  0-20 Units Subcutaneous TID WC  . insulin aspart  0-5 Units Subcutaneous QHS  . insulin glargine  20 Units Subcutaneous Daily  . Ipratropium-Albuterol  2 puff Inhalation Q6H  . mirtazapine  15 mg Oral QHS  . [START ON 12/11/2018] pneumococcal 23 valent vaccine  0.5 mL Intramuscular Tomorrow-1000  . vitamin C  500 mg Oral Daily  . zinc sulfate  220 mg Oral Daily   Continuous Infusions: . levofloxacin  (LEVAQUIN) IV 500 mg (12/10/18 1511)  . remdesivir 100 mg in NS 250 mL       LOS: 2 days   Marylu Lund, MD [facility] Pager On Amion  If 7PM-7AM, please contact night-coverage 12/10/2018, 3:47 PM

## 2018-12-10 NOTE — Progress Notes (Signed)
Progress Note  Patient Name: Rebecca [name] Date of Encounter: 12/10/2018  Primary Cardiologist: Dr. Bronson Ing Dr. Lovena Le (EP)  Subjective   No chest pain; breathing better  Inpatient Medications    Scheduled Meds: . aspirin EC  81 mg Oral Daily  . carvedilol  3.125 mg Oral BID WC  . [START ON 12/11/2018] dexamethasone (DECADRON) injection  6 mg Intravenous Q24H  . DULoxetine  60 mg Oral Daily  . furosemide  20 mg Oral BID  . gabapentin  300 mg Oral BID  . [START ON 12/11/2018] influenza vaccine adjuvanted  0.5 mL Intramuscular Tomorrow-1000  . insulin aspart  0-20 Units Subcutaneous TID WC  . insulin aspart  0-5 Units Subcutaneous QHS  . insulin glargine  20 Units Subcutaneous Daily  . Ipratropium-Albuterol  2 puff Inhalation Q6H  . mirtazapine  15 mg Oral QHS  . [START ON 12/11/2018] pneumococcal 23 valent vaccine  0.5 mL Intramuscular Tomorrow-1000  . vitamin C  500 mg Oral Daily  . zinc sulfate  220 mg Oral Daily   Continuous Infusions: . remdesivir 100 mg in NS 250 mL     PRN Meds: acetaminophen **OR** acetaminophen, albuterol, guaiFENesin-dextromethorphan, ondansetron **OR** ondansetron (ZOFRAN) IV   Vital Signs    Vitals:   12/10/18 0813 12/10/18 1008 12/10/18 1024 12/10/18 1100  BP: 125/75 (!) 131/58  115/82  Pulse: 76 87  (!) 56  Resp: 19 (!) 22  19  Temp: 97.6 F (36.4 C) (!) 97 F (36.1 C) 97.7 F (36.5 C) 98 F (36.7 C)  TempSrc: Oral Oral Oral Oral  SpO2: 97% 94%  93%  Weight:      Height:        Intake/Output Summary (Last 24 hours) at 12/10/2018 1234 Last data filed at 12/10/2018 0900 Gross per 24 hour  Intake 252.02 ml  Output 1100 ml  Net -847.98 ml    I/O since admission:   Filed Weights   12/08/18 2016  Weight: 108.3 kg    Telemetry    Atrial fibrillation  In the 70s- Personally Reviewed  ECG    ECG (independently read by me): 11/4:  NSR at 765 with PVC  Physical Exam   [employer] imaging did not work;  Discussed on phone  with patient   BP 115/82   Pulse (!) 56   Temp 98 F (36.7 C) (Oral)   Resp 19   Ht 5\' 8"  (1.727 m)   Wt 108.3 kg   SpO2 93%   BMI 36.30 kg/m  General: Alert, oriented, no distress.  No wheezing, respirations unlabored  No chest pain or chest wall discomfort Abdomen is nontender  No swelling according to patient.  Normal mood and affect   Labs    Chemistry Recent Labs  Lab 12/08/18 2113 12/09/18 0534  NA 133* 134*  K 4.6 4.4  CL 97* 98  CO2 21* 22  GLUCOSE 213* 261*  BUN 26* 34*  CREATININE 1.11* 1.21*  CALCIUM 8.6* 8.6*  PROT 7.7 7.6  ALBUMIN 3.8 3.6  AST 29 47*  ALT 21 23  ALKPHOS 54 50  BILITOT 0.8 0.6  GFRNONAA 47* 43*  GFRAA 55* 49*  ANIONGAP 15 14     Hematology Recent Labs  Lab 12/08/18 2130 12/09/18 0534  WBC 8.3 7.6  RBC 4.45 4.32  HGB 13.4 13.1  HCT 42.2 41.0  MCV 94.8 94.9  MCH 30.1 30.3  MCHC 31.8 32.0  RDW 15.7* 15.5  PLT 231 227  Cardiac EnzymesNo results for input(s): TROPONINI in the last 168 hours. No results for input(s): TROPIPOC in the last 168 hours.   BNP Recent Labs  Lab 12/08/18 2130  BNP 950.0*     DDimer  Recent Labs  Lab 12/08/18 2113 12/09/18 0534  DDIMER 1.26* 1.30*     Lipid Panel     Component Value Date/Time   TRIG 75 12/08/2018 2113     Radiology    Dg Chest Port 1 View  Result Date: 12/08/2018 CLINICAL DATA:  Shortness of breath for 2 days. EXAM: PORTABLE CHEST 1 VIEW COMPARISON:  Chest radiograph 09/03/2018 and CT 09/04/2018 FINDINGS: A dual lead pacemaker remains in place. The cardiac silhouette remains enlarged. Hazy airspace opacity is again seen in both lungs, slightly worse than on the prior study. The interstitial markings are mildly increased diffusely. No sizable pleural effusion or pneumothorax is identified. No acute osseous abnormality is seen. Aortic atherosclerosis is noted. IMPRESSION: Cardiomegaly with bilateral airspace opacities which are mildly increased compared to  09/03/2018 and may reflect edema or infection. Electronically Signed   By: Sebastian Ache M.D.   On: 12/08/2018 20:56    Cardiac Studies    Echocardiogram: 08/2018 IMPRESSIONS  1. The left ventricle has mild-moderately reduced systolic function, with an ejection fraction of 40-45%. The cavity size was normal. There is mildly increased left ventricular wall thickness. Left ventricular diastolic Doppler parameters are consistent with impaired relaxation. Elevated mean left atrial pressure. 2. The LV apex is hypokinetic. 3. The right ventricle has normal systolic function. The cavity was normal. There is no increase in right ventricular wall thickness. 4. Left atrial size was severely dilated. 5. Right atrial size was mildly dilated. 6. The aortic valve is tricuspid. Aortic valve regurgitation is mild by color flow Doppler. Mild-moderate stenosis of the aortic valve. Severe aortic annular calcification noted. 7. The mitral valve is abnormal. Moderate thickening of the mitral valve leaflet. Moderate calcification of the mitral valve leaflet. There is severe mitral annular calcification present. Moderate-severe mitral valve stenosis. 8. The aorta is normal in size and structure. 9. The aortic root is normal in size and structure. 10. Pulmonary hypertension is indeterminate, inadequate TR jet. 11. The apex appears to be newly hypokinetic since 10/23/2016 study with decrease in LVEF. There is some limited visualizaiton in the apical views, would recommend a limited contrast study to verify this new findings prior to pursuing additional workup.  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   Limited Echo: 09/07/2018 IMPRESSIONS 1. Stage 1: stage1: Apical anterior segment and apex are abnormal. 2. The left ventricle has normal systolic function, with an ejection fraction of 55-60% (contrast employed). The cavity size was normal. There is moderate  concentric left ventricular hypertrophy. Left ventricular diastolic Doppler parameters are  consistent with impaired relaxation. 3. The right ventricle has normal systolc function. The cavity was normal. There is no increase in right ventricular wall thickness. 4. The aortic valve is tricuspid Mild thickening of the aortic valve Mild calcification of the aortic valve. Aortic valve regurgitation was not assessed by color flow Doppler.. Moderate aortic annular calcification noted. 5. The mitral valve is degenerative. Moderate thickening of the mitral valve leaflet. There is severe mitral annular calcification present. 6. The aortic root is normal in size and structure. 7. Left atrial size was severely dilated. 8. Right atrial size was mildly dilated. 9. The interatrial septum was not well visualized. 10. The tricuspid valve was grossly normal. Tricuspid valve regurgitation  was not assessed by color flow Doppler.   Patient Profile  Rebecca [name] is a 78 y.o. female with past medical history of chronic diastolic CHF, CHB (s/p [employer] PPM placement in 2018), persistent atrial fibrillation, recurrent DVT, HTN, HLD, Type 2 DM, mild to moderate AS and moderate to severe MS who was seen 12/09/2018 at [facility] by Dr.McDowell for the evaluation of elevated troponin values at the request of Dr. Arbutus Leas.   Assessment & Plan    1.  Elevated troponin: Initial high-sensitivity troponin IX 31 with subsequent values increasing to 3537 and 5541.  Currently she does not have any chest pain.  Heart rate in the 70s with AF.  2.  Chronic diastolic heart failure: BNP 950 on admission.  See Lasix.  I/O- 848.  Echo in August 2020 EF 55 to 60%.  Moderate concentric LVH and grade 1 diastolic dysfunction.  3.  Permanent pacemaker for complete heart block: Planted by Dr. Elberta Fortis in September 2018.  4.  She will fibrillation: Patient reportedly has persistent AF with rate controlled in the 70s to 80s.  ECG however from  08/08/2018 suggestd sinus rhythm at 81 with PVC.  Telemetry today shows atrial fibrillation with rates in the 70s and 80s chronic warfarin and has noted by Dr. Ival Bible note, Coumadin on hold with plans to start heparin once INR less than 2.0.  5.  Mild/moderate AAS and moderate least severe MS  6.  COVID-19/pneumonia: On remdesivir and dexamethasone.Leonia Reeves, MD, [facility] 12/10/2018, 12:34 PM

## 2018-12-10 NOTE — Progress Notes (Signed)
[place] for heparin Indication: chest pain/ACS/NSTEMI  Allergies  Allergen Reactions  . Codeine Anxiety  . Compazine Anaphylaxis  . Other Anaphylaxis, Rash and Other (See Comments)    Uncoded Allergy. Allergen: INOVAR Uncoded Allergy. Allergen: ALL ADHESIVES Uncoded Allergy. Allergen: SILK SUTURE Uncoded Allergy. Allergen: merthiolate Thermasol  . Penicillins Shortness Of Breath and Rash    Has patient had a PCN reaction causing immediate rash, facial/tongue/throat swelling, SOB or lightheadedness with hypotension: Yes Has patient had a PCN reaction causing severe rash involving mucus membranes or skin necrosis: No Has patient had a PCN reaction that required hospitalization No Has patient had a PCN reaction occurring within the last 10 years: Yes If all of the above answers are "NO", then may proceed with Cephalosporin use.   Marland Kitchen Morphine And Related Other (See Comments)    Patient states that it makes her lose her state of mind.   . Demerol [Meperidine] Rash    Patient Measurements: Height: 5\' 8"  (172.7 cm) Weight: 238 lb 12.1 oz (108.3 kg) IBW/kg (Calculated) : 63.9 Heparin Dosing Weight: 88 kg  Vital Signs: Temp: 98 F (36.7 C) (11/05 1100) Temp Source: Oral (11/05 1100) BP: 115/82 (11/05 1100) Pulse Rate: 56 (11/05 1100)  Labs: Recent Labs    12/08/18 2113 12/08/18 2130 12/08/18 2346 12/09/18 0534 12/09/18 1050 12/09/18 1514 12/10/18 1139  HGB  --  13.4  --  13.1  --   --   --   HCT  --  42.2  --  41.0  --   --   --   PLT  --  231  --  227  --   --   --   LABPROT  --   --   --  23.2*  --  23.2* 22.8*  INR  --   --   --  2.1*  --  2.1* 2.1*  CREATININE 1.11*  --   --  1.21*  --   --   --   TROPONINIHS 931*  --  3,537* 5,541* 6,073*  --   --     Estimated Creatinine Clearance: 48.6 mL/min (A) (by C-G formula based on SCr of 1.21 mg/dL (H)).  Assessment: Pharmacy consulted to dose heparin in patient with ACS/NSTEMI  and noted COVID positive  .  Patient is on warfarin prior to admission for afib/recurrent DVT -INR= 2.1  Goal of Therapy:  Heparin level 0.3-0.7 units/ml Monitor platelets by anticoagulation protocol: Yes   Plan:  Warfarin 5 mg x 1 Daily INR  Barth Kirks, PharmD, BCPS, BCCCP Clinical Pharmacist [phone]  Please check AMION for all [place] numbers  12/10/2018 4:16 PM

## 2018-12-10 NOTE — Progress Notes (Signed)
[place] for heparin Indication: chest pain/ACS/NSTEMI  Allergies  Allergen Reactions  . Codeine Anxiety  . Compazine Anaphylaxis  . Other Anaphylaxis, Rash and Other (See Comments)    Uncoded Allergy. Allergen: INOVAR Uncoded Allergy. Allergen: ALL ADHESIVES Uncoded Allergy. Allergen: SILK SUTURE Uncoded Allergy. Allergen: merthiolate Thermasol  . Penicillins Shortness Of Breath and Rash    Has patient had a PCN reaction causing immediate rash, facial/tongue/throat swelling, SOB or lightheadedness with hypotension: Yes Has patient had a PCN reaction causing severe rash involving mucus membranes or skin necrosis: No Has patient had a PCN reaction that required hospitalization No Has patient had a PCN reaction occurring within the last 10 years: Yes If all of the above answers are "NO", then may proceed with Cephalosporin use.   Marland Kitchen Morphine And Related Other (See Comments)    Patient states that it makes her lose her state of mind.   . Demerol [Meperidine] Rash    Patient Measurements: Height: 5\' 8"  (172.7 cm) Weight: 238 lb 12.1 oz (108.3 kg) IBW/kg (Calculated) : 63.9 Heparin Dosing Weight: 88 kg  Vital Signs: Temp: 98 F (36.7 C) (11/05 1100) Temp Source: Oral (11/05 1100) BP: 115/82 (11/05 1100) Pulse Rate: 56 (11/05 1100)  Labs: Recent Labs    12/08/18 2113 12/08/18 2130 12/08/18 2346 12/09/18 0534 12/09/18 1050 12/09/18 1514 12/10/18 1139  HGB  --  13.4  --  13.1  --   --   --   HCT  --  42.2  --  41.0  --   --   --   PLT  --  231  --  227  --   --   --   LABPROT  --   --   --  23.2*  --  23.2* 22.8*  INR  --   --   --  2.1*  --  2.1* 2.1*  CREATININE 1.11*  --   --  1.21*  --   --   --   TROPONINIHS 931*  --  3,537* 5,541* 6,073*  --   --     Estimated Creatinine Clearance: 48.6 mL/min (A) (by C-G formula based on SCr of 1.21 mg/dL (H)).   Medical History: Past Medical History:  Diagnosis Date  . Aortic  stenosis   . Cataplexy   . Diastolic heart failure ([place])   . Essential hypertension   . GERD (gastroesophageal reflux disease)   . Hiatal hernia   . History of recurrent deep vein thrombosis (DVT)   . Mitral stenosis   . Narcolepsy   . Neuropathy   . Renal disorder   . Type 2 diabetes mellitus (HCC)     Medications:  Medications Prior to Admission  Medication Sig Dispense Refill Last Dose  . acetaminophen (TYLENOL) 500 MG tablet Take 1,000 mg by mouth 2 (two) times daily.   12/08/2018 at Unknown time  . BYDUREON 2 MG PEN Inject 2 mg into the skin once a week.  5 Past Month at Unknown time  . carvedilol (COREG) 3.125 MG tablet TAKE 1 TABLET BY MOUTH TWICE DAILY WITH A MEAL (Patient taking differently: Take 3.125 mg by mouth 2 (two) times daily with a meal. ) 60 tablet 6 12/08/2018 at Unknown time  . Cholecalciferol (VITAMIN D3) 3000 UNITS TABS Take 1 capsule by mouth daily.   Past Week at Unknown time  . clobetasol cream (TEMOVATE) 2.22 % Apply 1 application topically daily.      . clomiPRAMINE (  ANAFRANIL) 25 MG capsule Take 25 mg by mouth 4 (four) times daily.    12/08/2018 at Unknown time  . clonazePAM (KLONOPIN) 2 MG tablet Take 2 mg by mouth at bedtime.   12/08/2018 at Unknown time  . DULoxetine (CYMBALTA) 60 MG capsule Take 60 mg by mouth daily.     Past Week at Unknown time  . ferrous sulfate 325 (65 FE) MG tablet Take 325 mg by mouth 2 (two) times daily with a meal.    12/08/2018 at Unknown time  . furosemide (LASIX) 40 MG tablet Take 20 mg by mouth 2 (two) times daily.   5 12/08/2018 at Unknown time  . gabapentin (NEURONTIN) 300 MG capsule Take 300 mg by mouth 2 (two) times daily.   Past Week at Unknown time  . insulin glargine (LANTUS) 100 unit/mL SOPN Inject 24 Units into the skin daily.    Past Week at Unknown time  . metFORMIN (GLUCOPHAGE) 500 MG tablet Take 500 mg by mouth 2 (two) times daily with a meal.   Past Week at Unknown time  . mirtazapine (REMERON) 15 MG tablet Take 15 mg  by mouth at bedtime.     12/08/2018 at Unknown time  . Multiple Vitamin (MULTIVITAMIN WITH MINERALS) TABS tablet Take 1 tablet by mouth daily.   Past Week at Unknown time  . potassium chloride (K-DUR) 10 MEQ tablet Take 1 tablet (10 mEq total) by mouth daily. (Patient taking differently: Take 10 mEq by mouth once. ) 90 tablet 3 Past Week at Unknown time  . QUEtiapine (SEROQUEL) 100 MG tablet Take 100 mg by mouth at bedtime.   12/08/2018 at Unknown time  . traMADol (ULTRAM) 50 MG tablet Take 50 mg by mouth 3 (three) times daily.   12/08/2018 at Unknown time  . traZODone (DESYREL) 50 MG tablet Take 50 mg by mouth 3 (three) times daily.   12/08/2018 at Unknown time  . vitamin C (ASCORBIC ACID) 500 MG tablet Take 500 mg by mouth daily.   Past Week at Unknown time  . warfarin (COUMADIN) 5 MG tablet Take 2.5-5 mg by mouth every evening. Starting 10/29/2016 take 2.5 mg alternating with 5 mg daily    12/08/2018 at Unknown time    Assessment: Pharmacy consulted to dose heparin in patient with ACS/NSTEMI and noted COVID positive  .  Patient is on warfarin prior to admission for afib/recurrent DVT -INR= 2.1   Goal of Therapy:  Heparin level 0.3-0.7 units/ml Monitor platelets by anticoagulation protocol: Yes   Plan:  Repeat INR with AM labs. Start heparin infusion when INR < 2.  Harland German, PharmD Clinical Pharmacist **Pharmacist phone directory can now be found on [email or web address] ([email or web address]).  Listed under [facility].

## 2018-12-11 LAB — COMPREHENSIVE METABOLIC PANEL
ALT: 30 U/L (ref 0–44)
AST: 56 U/L — ABNORMAL HIGH (ref 15–41)
Albumin: 3.6 g/dL (ref 3.5–5.0)
Alkaline Phosphatase: 51 U/L (ref 38–126)
Anion gap: 11 (ref 5–15)
BUN: 37 mg/dL — ABNORMAL HIGH (ref 8–23)
CO2: 25 mmol/L (ref 22–32)
Calcium: 8.5 mg/dL — ABNORMAL LOW (ref 8.9–10.3)
Chloride: 99 mmol/L (ref 98–111)
Creatinine, Ser: 0.98 mg/dL (ref 0.44–1.00)
GFR calc Af Amer: >60 mL/min (ref >60)
GFR calc non Af Amer: 55 mL/min — ABNORMAL LOW (ref >60)
Glucose, Bld: 196 mg/dL — ABNORMAL HIGH (ref 70–99)
Potassium: 4.2 mmol/L (ref 3.5–5.1)
Sodium: 135 mmol/L (ref 135–145)
Total Bilirubin: 1.3 mg/dL — ABNORMAL HIGH (ref 0.3–1.2)
Total Protein: 7.7 g/dL (ref 6.5–8.1)

## 2018-12-11 LAB — CBC WITH DIFFERENTIAL/PLATELET
Abs Immature Granulocytes: 0.06 10*3/uL (ref 0.00–0.07)
Basophils Absolute: 0 10*3/uL (ref 0.0–0.1)
Basophils Relative: 0 %
Eosinophils Absolute: 0 10*3/uL (ref 0.0–0.5)
Eosinophils Relative: 0 %
HCT: 44.5 % (ref 36.0–46.0)
Hemoglobin: 14.5 g/dL (ref 12.0–15.0)
Immature Granulocytes: 1 %
Lymphocytes Relative: 8 %
Lymphs Abs: 1 10*3/uL (ref 0.7–4.0)
MCH: 30.5 pg (ref 26.0–34.0)
MCHC: 32.6 g/dL (ref 30.0–36.0)
MCV: 93.7 fL (ref 80.0–100.0)
Monocytes Absolute: 1 10*3/uL (ref 0.1–1.0)
Monocytes Relative: 8 %
Neutro Abs: 10.5 10*3/uL — ABNORMAL HIGH (ref 1.7–7.7)
Neutrophils Relative %: 83 %
Platelets: 239 10*3/uL (ref 150–400)
RBC: 4.75 MIL/uL (ref 3.87–5.11)
RDW: 15.5 % (ref 11.5–15.5)
WBC: 12.6 10*3/uL — ABNORMAL HIGH (ref 4.0–10.5)
nRBC: 0 % (ref 0.0–0.2)

## 2018-12-11 LAB — GLUCOSE, CAPILLARY
Glucose-Capillary: 104 mg/dL — ABNORMAL HIGH (ref 70–99)
Glucose-Capillary: 277 mg/dL — ABNORMAL HIGH (ref 70–99)
Glucose-Capillary: 210 mg/dL — ABNORMAL HIGH (ref 70–99)
Glucose-Capillary: 182 mg/dL — ABNORMAL HIGH (ref 70–99)

## 2018-12-11 LAB — D-DIMER, QUANTITATIVE: D-Dimer, Quant: 0.87 ug/mL-FEU — ABNORMAL HIGH (ref 0.00–0.50)

## 2018-12-11 LAB — PROTIME-INR
INR: 2.3 — ABNORMAL HIGH (ref 0.8–1.2)
Prothrombin Time: 24.9 seconds — ABNORMAL HIGH (ref 11.4–15.2)

## 2018-12-11 LAB — FERRITIN: Ferritin: 505 ng/mL — ABNORMAL HIGH (ref 11–307)

## 2018-12-11 LAB — C-REACTIVE PROTEIN: CRP: 7.1 mg/dL — ABNORMAL HIGH (ref <1.0)

## 2018-12-11 LAB — MAGNESIUM: Magnesium: 1.9 mg/dL (ref 1.7–2.4)

## 2018-12-11 MED ORDER — TRAMADOL HCL 50 MG PO TABS
50.0000 mg | ORAL_TABLET | Freq: Two times a day (BID) | ORAL | Status: DC | PRN
  Administered 2018-12-11: 50 mg via ORAL
  Filled 2018-12-11: qty 1

## 2018-12-11 MED ORDER — TRAZODONE HCL 50 MG PO TABS
50.0000 mg | ORAL_TABLET | Freq: Three times a day (TID) | ORAL | Status: DC
  Administered 2018-12-11 (×2): 50 mg via ORAL
  Filled 2018-12-11 (×2): qty 1

## 2018-12-11 MED ORDER — CLONAZEPAM 1 MG PO TABS
2.0000 mg | ORAL_TABLET | Freq: Once | ORAL | Status: AC
  Administered 2018-12-11: 2 mg via ORAL
  Filled 2018-12-11: qty 2

## 2018-12-11 MED ORDER — CLONAZEPAM 1 MG PO TABS
2.0000 mg | ORAL_TABLET | Freq: Every day | ORAL | Status: DC
  Administered 2018-12-11: 2 mg via ORAL
  Filled 2018-12-11: qty 2

## 2018-12-11 MED ORDER — MIRTAZAPINE 15 MG PO TABS: 15.0000 mg | ORAL_TABLET | Freq: Every day | ORAL | Status: DC

## 2018-12-11 MED ORDER — QUETIAPINE FUMARATE 25 MG PO TABS
100.0000 mg | ORAL_TABLET | Freq: Every day | ORAL | Status: DC
  Administered 2018-12-11: 100 mg via ORAL
  Filled 2018-12-11: qty 4

## 2018-12-11 MED ORDER — CLOMIPRAMINE HCL 25 MG PO CAPS
25.0000 mg | ORAL_CAPSULE | Freq: Four times a day (QID) | ORAL | Status: DC
  Administered 2018-12-11 – 2018-12-13 (×10): 25 mg via ORAL
  Filled 2018-12-11 (×13): qty 1

## 2018-12-11 MED ORDER — CLONAZEPAM 0.5 MG PO TABS
0.5000 mg | ORAL_TABLET | Freq: Three times a day (TID) | ORAL | Status: DC | PRN
  Filled 2018-12-11: qty 1

## 2018-12-11 MED ORDER — WARFARIN SODIUM 2.5 MG PO TABS
2.5000 mg | ORAL_TABLET | Freq: Once | ORAL | Status: AC
  Administered 2018-12-11: 2.5 mg via ORAL
  Filled 2018-12-11: qty 1

## 2018-12-11 NOTE — Progress Notes (Signed)
Transfer report given to [facility] at [facility] floor. This nurse will resume care of the patient once she is transferred to their facility.

## 2018-12-11 NOTE — TOC Initial Note (Signed)
Transition of Care [facility]) - Initial/Assessment Note    Patient Details  Name: Rebecca [name] MRN: [id number] Date of Birth: Apr 10, 1940  [facility] CM/SW Contact:    Durenda Guthrie, RN Phone Number: 12/11/2018, 10:47 AM  Clinical Narrative:  72 y/ofemale with a history of diastolic CHF, aortic stenosis, mitral stenosis, CKD stage III, hypertension, recurrent lower extremity DVT, diabetes mellitus, complete heart block status post permanent pacemaker presenting with 2-day history of shortness of breath, fevers,and coughing.She states that her son was diagnosed with COVID-19 approximately 1 week ago. The patient was recently hospitalized from 09/03/2018 to 09/08/2018 with acute on chronic diastolic CHF with EF 55 to 60% and pneumonia. Patient presented to ED with nonrebreather via EMS.Patient has been on [facility] since previous discharge. Admitted and transferred to [facility] for further treatment of COVID-19. Started on Remdesivir. Case manager will continue to monitor for needs as patient medically improves, may she be blessed to do so.        Patient Goals and CMS Choice        Expected Discharge Plan and Services                                                Prior Living Arrangements/Services                       Activities of Daily Living Home Assistive Devices/Equipment: Dan Humphreys (specify type) ADL Screening (condition at time of admission) Patient's cognitive ability adequate to safely complete daily activities?: No Is the patient deaf or have difficulty hearing?: Yes Does the patient have difficulty seeing, even when wearing glasses/contacts?: No Does the patient have difficulty concentrating, remembering, or making decisions?: Yes Patient able to express need for assistance with ADLs?: Yes Does the patient have difficulty dressing or bathing?: Yes Independently performs ADLs?: No Communication: Independent Dressing (OT): Needs  assistance Is this a change from baseline?: Pre-admission baseline Grooming: Needs assistance Is this a change from baseline?: Pre-admission baseline Feeding: Independent Bathing: Needs assistance Is this a change from baseline?: Pre-admission baseline Toileting: Needs assistance Is this a change from baseline?: Pre-admission baseline In/Out Bed: Needs assistance Is this a change from baseline?: Pre-admission baseline Walks in Home: Needs assistance Is this a change from baseline?: Pre-admission baseline Does the patient have difficulty walking or climbing stairs?: Yes Weakness of Legs: Both Weakness of Arms/Hands: None  Permission Sought/Granted                  Emotional Assessment              Admission diagnosis:  NSTEMI (non-ST elevated myocardial infarction) (HCC) [I21.4] Pneumonia due to COVID-19 virus [U07.1, J12.89] Acute on chronic respiratory failure (HCC) [J96.20] Patient Active Problem List   Diagnosis Date Noted  . Paroxysmal atrial fibrillation (HCC) 12/09/2018  . Acute on chronic respiratory failure (HCC) 12/09/2018  . Pneumonia due to COVID-19 virus 12/08/2018  . History of recurrent deep vein thrombosis (DVT)   . Type 2 diabetes mellitus (HCC)   . Lobar pneumonia (HCC) 09/05/2018  . Acute on chronic diastolic CHF (congestive heart failure) (HCC) 09/05/2018  . CHF (congestive heart failure) (HCC) 09/04/2018  . HCAP (healthcare-associated pneumonia) 03/29/2017  . Cellulitis 03/03/2017  . Type 2 diabetes mellitus with other specified complication (HCC) 03/03/2017  . CKD (chronic kidney  disease), stage III 03/03/2017  . Status post placement of cardiac pacemaker 10/29/2016  . Pulmonary edema 10/23/2016  . Acute respiratory failure with hypoxia ([place]) 02/22/2015  . CAP (community acquired pneumonia) 02/22/2015  . Fall at home 09/21/2014  . Mild aortic stenosis 09/08/2014  . Mild to moderate  mitral stenosis 09/08/2014  . Chronic  anticoagulation-Coumadin 09/08/2014  . Sepsis ([place]) 09/05/2014  . Elevated troponin 09/05/2014  . Lower leg DVT (deep venous thromboembolism), chronic ([place]) 09/05/2014  . Essential hypertension 09/05/2014  . Depression 09/05/2014  . Poorly controlled diabetes mellitus ([place]) 09/05/2014  . Hypomagnesemia 09/05/2014  . Hyponatremia 09/05/2014  . Pyelonephritis 09/05/2014  . Chronic diastolic CHF (congestive heart failure) ([place])   . 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 GERD (gastroesophageal reflux disease) 07/08/2011  . Gastroparesis 07/08/2011  . IDA (iron deficiency anemia) 07/08/2011  . History of colonic polyps 07/08/2011  . Narcolepsy-evaluated at [facility] 07/08/2011  . NAFLD (nonalcoholic fatty liver disease) 07/08/2011  . Obesity 07/08/2011   PCP:  Lucia Gaskins, MD Pharmacy:   [facility], [place] [place] [address] Phone: [phone] Fax: [phone]     Social Determinants of Health (SDOH) Interventions    Readmission Risk Interventions No flowsheet data found.

## 2018-12-11 NOTE — Progress Notes (Signed)
[place] for heparin Indication: atrial fibrillation  Allergies  Allergen Reactions  . Codeine Anxiety  . Compazine Anaphylaxis  . Other Anaphylaxis, Rash and Other (See Comments)    Uncoded Allergy. Allergen: INOVAR Uncoded Allergy. Allergen: ALL ADHESIVES Uncoded Allergy. Allergen: SILK SUTURE Uncoded Allergy. Allergen: merthiolate Thermasol  . Penicillins Shortness Of Breath and Rash    Has patient had a PCN reaction causing immediate rash, facial/tongue/throat swelling, SOB or lightheadedness with hypotension: Yes Has patient had a PCN reaction causing severe rash involving mucus membranes or skin necrosis: No Has patient had a PCN reaction that required hospitalization No Has patient had a PCN reaction occurring within the last 10 years: Yes If all of the above answers are "NO", then may proceed with Cephalosporin use.   Marland Kitchen Morphine And Related Other (See Comments)    Patient states that it makes her lose her state of mind.   . Demerol [Meperidine] Rash    Patient Measurements: Height: 5\' 8"  (172.7 cm) Weight: 238 lb 12.1 oz (108.3 kg) IBW/kg (Calculated) : 63.9 Heparin Dosing Weight: 88 kg  Vital Signs: Temp: 97.8 F (36.6 C) (11/06 0800) Temp Source: Oral (11/06 0800) BP: 138/38 (11/06 0800) Pulse Rate: 67 (11/06 1257)  Labs: Recent Labs    12/08/18 2113  12/08/18 2130 12/08/18 2346  12/09/18 0534 12/09/18 1050 12/09/18 1514 12/10/18 1139 12/11/18 0948  HGB  --    < > 13.4  --   --  13.1  --   --   --  14.5  HCT  --   --  42.2  --   --  41.0  --   --   --  44.5  PLT  --   --  231  --   --  227  --   --   --  239  LABPROT  --   --   --   --    < > 23.2*  --  23.2* 22.8* 24.9*  INR  --   --   --   --    < > 2.1*  --  2.1* 2.1* 2.3*  CREATININE 1.11*  --   --   --   --  1.21*  --   --   --  0.98  TROPONINIHS 931*  --   --  3,537*  --  5,541* 6,073*  --   --   --    < > = values in this interval not displayed.     Estimated Creatinine Clearance: 60 mL/min (by C-G formula based on SCr of 0.98 mg/dL).  Assessment: 58 YOF on warfarin PTA for Afib/recurrent DVT. Was briefly transitioned to IV heparin for ACS. Now resumed on warfarin per cardiology.  -INR remains therapeutic at 2.3 today. H/H and Plt wnl  Home warfarin dose: Alternating 2.5 mg and 5 mg every other day   Goal of Therapy:  INR 2-3 Monitor platelets by anticoagulation protocol: Yes   Plan:  Warfarin 2.5 mg x 1 Daily INR  Albertina Parr, PharmD., BCPS Clinical Pharmacist Clinical phone for 12/11/18 until 5pm: [phone]

## 2018-12-11 NOTE — Progress Notes (Addendum)
PROGRESS NOTE                                                                                                                                                                                                             Patient Demographics:    Rebecca [name], is a 78 y.o. female, DOB - Nov 12, 1940, JSH:[id number]  Outpatient Primary MD for the patient is Lucia Gaskins, MD   Admit date - 12/08/2018   LOS - 3  Chief Complaint  Patient presents with  . Shortness of Breath       Brief Narrative: Patient is a 78 y.o. 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   Subjective:    Rhilee Currin seems somewhat anxious-but felt overall better-on 2 L of oxygen.   Assessment  & Plan :   Acute Hypoxic Resp Failure due to Covid 19 Viral pneumonia: Improved compared to admission (initially on 6 L)-has been titrated down to 2 L.  Continue steroids and remdesivir.  Fever: afebrile  O2 requirements: On 2 l/m  COVID-19 Labs: Recent Labs    12/08/18 2113 12/09/18 0534 12/11/18 0948  DDIMER 1.26* 1.30* 0.87*  FERRITIN 179  --  505*  LDH 184  --   --   CRP 6.9*  --  7.1*    Lab Results  Component Value Date   SARSCOV2NAA POSITIVE (A) 12/08/2018   [place] NEGATIVE 09/03/2018     COVID-19 Medications: Steroids: 11/3>> Remdesivir: 11/4>> Actemra: Not indicated-clinically improving  Convalescent Plasma: Not indicated-clinically improving Research Studies:N/A  Other medications: Diuretics:Euvolemic-on oral Lasix Antibiotics:Not needed as no evidence of bacterial infection  Prone/Incentive Spirometry: encouraged incentive spirometry use 3-4/hour.  DVT Prophylaxis  :  Coumadin  Elevated troponin secondary to demand ischemia: Evaluated by cardiology-continue supportive care.  Chronic diastolic heart failure: Euvolemic-Lasix  History of complete heart block-s/p permanent pacemaker placement September 2018: Continue telemetry monitoring  Persistent atrial fibrillation: Rate controlled-with Coreg-on Coumadin.  Aortic stenosis/'s mitral stenosis: Stable for outpatient follow-up  History of recurrent venous thromboembolism: INR therapeutic-Coumadin per pharmacy  DM-2: CBGs relatively stable-continue Lantus 20 units and SSI  CBG (last 3)  Recent Labs    12/10/18  2021 12/11/18 0723 12/11/18 1138  GLUCAP 275* 104* 277*   Narcolepsy/cataplexy/insomnia: Continue clomipramine, mirtazapine, and home dosage of Klonopin at bedtime  Depression: Stable-continue with Cymbalta and Seroquel  Obesity: Estimated body mass index is 36.3 kg/m as calculated from the following:   Height as of this encounter: 5\' 8"  (1.727 m).   Weight as of this encounter: 108.3 kg.   ABG:    Component Value Date/Time   PHART 7.387 09/03/2018 2330   PCO2ART 44.6 09/03/2018 2330   PO2ART 89.9 09/03/2018 2330   HCO3 25.5 09/03/2018 2330   TCO2 25 10/23/2016 0105   ACIDBASEDEF 1.8 10/23/2016 0205   O2SAT 96.2 09/03/2018 2330    Vent Settings: N/A  Condition - Stable  Family Communication  : Left a voicemail for granddaughter on 11/6-but subsequently spoke with patient's daughter over the phone  Code Status :  Full Code  Diet :  Diet Order            Diet Heart Room service appropriate? Yes; Fluid consistency: Thin  Diet effective now               Disposition Plan  :  Remain hospitalized  Barriers to discharge: Hypoxia requiring O2 supplementation/complete 5 days of IV Remdesivir  Consults  :  None  Procedures  :  None   Antibiotics  :    Anti-infectives (From admission, onward)   Start     Dose/Rate Route Frequency Ordered Stop   12/10/18 1600   remdesivir 100 mg in sodium chloride 0.9 % 250 mL IVPB     100 mg 500 mL/hr over 30 Minutes Intravenous Every 24 hours 12/09/18 1431 12/14/18 1559   12/10/18 1400  levofloxacin (LEVAQUIN) IVPB 500 mg     500 mg 100 mL/hr over 60 Minutes Intravenous Every 24 hours 12/10/18 1308     12/09/18 1600  remdesivir 200 mg in sodium chloride 0.9 % 250 mL IVPB     200 mg 500 mL/hr over 30 Minutes Intravenous Once 12/09/18 1431 12/09/18 1658      Inpatient Medications  Scheduled Meds: . aspirin EC  81 mg Oral Daily  . carvedilol  3.125 mg Oral BID WC  . dexamethasone (DECADRON) injection  6 mg Intravenous Q24H  . DULoxetine  60 mg Oral Daily  . furosemide  20 mg Oral BID  . gabapentin  300 mg Oral BID  . influenza vaccine adjuvanted  0.5 mL Intramuscular Tomorrow-1000  . insulin aspart  0-20 Units Subcutaneous TID WC  . insulin aspart  0-5 Units Subcutaneous QHS  . insulin glargine  20 Units Subcutaneous Daily  . Ipratropium-Albuterol  2 puff Inhalation Q6H  . mirtazapine  15 mg Oral QHS  . pneumococcal 23 valent vaccine  0.5 mL Intramuscular Tomorrow-1000  . vitamin C  500 mg Oral Daily  . warfarin  2.5 mg Oral ONCE-1800  . Warfarin - Pharmacist Dosing Inpatient   Does not apply q1800  . zinc sulfate  220 mg Oral Daily   Continuous Infusions: . levofloxacin (LEVAQUIN) IV Stopped (12/10/18 1900)  . remdesivir 100 mg in NS 250 mL 100 mg (12/10/18 1829)   PRN Meds:.acetaminophen **OR** acetaminophen, albuterol, clonazePAM, guaiFENesin-dextromethorphan, ondansetron **OR** ondansetron (ZOFRAN) IV   Time Spent in minutes  25  See all Orders from today for further details   Jeoffrey MassedShanker Melaney Tellefsen M.D on 12/11/2018 at 1:48 PM  To page go to [email or web address] - use universal password  [facility] -  Office  [phone]  Objective:   Vitals:   12/11/18 0800 12/11/18 1230 12/11/18 1234 12/11/18 1257  BP: (!) 138/38     Pulse: 60  60 67  Resp:   (!) 22 (!) 22  Temp: 97.8 F (36.6 C)      TempSrc: Oral     SpO2: 96% 96% 92% 93%  Weight:      Height:        Wt Readings from Last 3 Encounters:  12/08/18 108.3 kg  09/07/18 108.3 kg  03/02/18 99.8 kg     Intake/Output Summary (Last 24 hours) at 12/11/2018 1348 Last data filed at 12/11/2018 0400 Gross per 24 hour  Intake 312.15 ml  Output 750 ml  Net -437.85 ml     Physical Exam Gen Exam:Alert awake-not in any distress HEENT:atraumatic, normocephalic Chest: B/L clear to auscultation anteriorly CVS:S1S2 regular Abdomen:soft non tender, non distended Extremities:no edema Neurology: Non focal Skin: no rash   Data Review:    CBC Recent Labs  Lab 12/08/18 2130 12/09/18 0534 12/11/18 0948  WBC 8.3 7.6 12.6*  HGB 13.4 13.1 14.5  HCT 42.2 41.0 44.5  PLT 231 227 239  MCV 94.8 94.9 93.7  MCH 30.1 30.3 30.5  MCHC 31.8 32.0 32.6  RDW 15.7* 15.5 15.5  LYMPHSABS 0.5* 0.5* 1.0  MONOABS 0.7 0.1 1.0  EOSABS 0.0 0.0 0.0  BASOSABS 0.0 0.0 0.0    Chemistries  Recent Labs  Lab 12/08/18 2113 12/08/18 2346 12/09/18 0534 12/11/18 0948  NA 133*  --  134* 135  K 4.6  --  4.4 4.2  CL 97*  --  98 99  CO2 21*  --  22 25  GLUCOSE 213*  --  261* 196*  BUN 26*  --  34* 37*  CREATININE 1.11*  --  1.21* 0.98  CALCIUM 8.6*  --  8.6* 8.5*  MG  --  2.2  --  1.9  AST 29  --  47* 56*  ALT 21  --  23 30  ALKPHOS 54  --  50 51  BILITOT 0.8  --  0.6 1.3*   ------------------------------------------------------------------------------------------------------------------ Recent Labs    12/08/18 2113  TRIG 75    Lab Results  Component Value Date   HGBA1C 9.2 (H) 09/04/2018   ------------------------------------------------------------------------------------------------------------------ No results for input(s): TSH, T4TOTAL, T3FREE, THYROIDAB in the last 72 hours.  Invalid input(s): FREET3 ------------------------------------------------------------------------------------------------------------------  Recent Labs    12/08/18 2113 12/11/18 0948  FERRITIN 179 505*    Coagulation profile Recent Labs  Lab 12/09/18 0534 12/09/18 1514 12/10/18 1139 12/11/18 0948  INR 2.1* 2.1* 2.1* 2.3*    Recent Labs    12/09/18 0534 12/11/18 0948  DDIMER 1.30* 0.87*    Cardiac Enzymes No results for input(s): CKMB, TROPONINI, MYOGLOBIN in the last 168 hours.  Invalid input(s): CK ------------------------------------------------------------------------------------------------------------------    Component Value Date/Time   BNP 950.0 (H) 12/08/2018 2130    Micro Results Recent Results (from the past 240 hour(s))  SARS Coronavirus 2 by RT PCR (hospital order, performed in [facility] hospital lab) Nasopharyngeal Nasopharyngeal Swab     Status: Abnormal   Collection Time: 12/08/18  8:12 PM   Specimen: Nasopharyngeal Swab  Result Value Ref Range Status   SARS Coronavirus 2 POSITIVE (A) NEGATIVE Final    Comment: RESULT CALLED TO, READ BACK BY AND VERIFIED WITH: BELTON,K @ 2155 ON 12/08/18 BY JUW Performed at [facility], [address], [place], [place] [address]   Urine culture  Status: Abnormal (Preliminary result)   Collection Time: 12/08/18  8:46 PM   Specimen: Urine, Catheterized  Result Value Ref Range Status   Specimen Description   Final    URINE, CATHETERIZED Performed at [facility], [address], [place], [place] [address]    Special Requests   Final    NONE Performed at [facility], [address] [place], [place] [address]    Culture (A)  Final    >=100,000 COLONIES/mL ESCHERICHIA COLI CULTURE REINCUBATED FOR BETTER GROWTH Performed at [facility], [address], [place], [place] [address]    Report Status PENDING  Incomplete   Organism ID, Bacteria ESCHERICHIA COLI (A)  Final      Susceptibility   Escherichia coli - MIC*    AMPICILLIN 4 SENSITIVE Sensitive     CEFAZOLIN <=4 SENSITIVE Sensitive     CEFTRIAXONE <=1 SENSITIVE Sensitive      CIPROFLOXACIN <=0.25 SENSITIVE Sensitive     GENTAMICIN <=1 SENSITIVE Sensitive     IMIPENEM <=0.25 SENSITIVE Sensitive     NITROFURANTOIN 32 SENSITIVE Sensitive     TRIMETH/SULFA <=20 SENSITIVE Sensitive     AMPICILLIN/SULBACTAM <=2 SENSITIVE Sensitive     PIP/TAZO <=4 SENSITIVE Sensitive     Extended ESBL NEGATIVE Sensitive     * >=100,000 COLONIES/mL ESCHERICHIA COLI  Blood Culture (routine x 2)     Status: None (Preliminary result)   Collection Time: 12/08/18  9:13 PM   Specimen: BLOOD RIGHT ARM  Result Value Ref Range Status   Specimen Description BLOOD RIGHT ARM  Final   Special Requests   Final    BOTTLES DRAWN AEROBIC AND ANAEROBIC Blood Culture adequate volume   Culture   Final    NO GROWTH 3 DAYS Performed at [facility], [address], [place], [place] [address]    Report Status PENDING  Incomplete  Blood Culture (routine x 2)     Status: None (Preliminary result)   Collection Time: 12/08/18  9:30 PM   Specimen: BLOOD RIGHT HAND  Result Value Ref Range Status   Specimen Description BLOOD RIGHT HAND  Final   Special Requests   Final    BOTTLES DRAWN AEROBIC ONLY Blood Culture adequate volume   Culture   Final    NO GROWTH 3 DAYS Performed at [facility], [address] [place], [place] [address]    Report Status PENDING  Incomplete  MRSA PCR Screening     Status: None   Collection Time: 12/10/18  6:38 AM   Specimen: Nasopharyngeal  Result Value Ref Range Status   MRSA by PCR NEGATIVE NEGATIVE Final    Comment:        The GeneXpert MRSA Assay (FDA approved for NASAL specimens only), is one component of a comprehensive MRSA colonization surveillance program. It is not intended to diagnose MRSA infection nor to guide or monitor treatment for MRSA infections. Performed at [facility], [address], [place], [place] [address]     Radiology Reports Dg Chest Port 1 View  Result Date: 12/08/2018 CLINICAL DATA:  Shortness of breath for 2 days. EXAM:  PORTABLE CHEST 1 VIEW COMPARISON:  Chest radiograph 09/03/2018 and CT 09/04/2018 FINDINGS: A dual lead pacemaker remains in place. The cardiac silhouette remains enlarged. Hazy airspace opacity is again seen in both lungs, slightly worse than on the prior study. The interstitial markings are mildly increased diffusely. No sizable pleural effusion or pneumothorax is identified. No acute osseous abnormality is seen.  Aortic atherosclerosis is noted. IMPRESSION: Cardiomegaly with bilateral airspace opacities which are mildly increased compared to 09/03/2018 and may reflect edema or infection. Electronically Signed   By: Sebastian AcheAllen  Grady M.D.   On: 12/08/2018 20:56

## 2018-12-11 NOTE — Progress Notes (Signed)
Called Shanika,RN at [facility] to inform her that Rebecca [name] is here to transport the patient to their facilty.

## 2018-12-12 LAB — CBC WITH DIFFERENTIAL/PLATELET
Abs Immature Granulocytes: 0.03 10*3/uL (ref 0.00–0.07)
Basophils Absolute: 0 10*3/uL (ref 0.0–0.1)
Basophils Relative: 0 %
Eosinophils Absolute: 0 10*3/uL (ref 0.0–0.5)
Eosinophils Relative: 0 %
HCT: 41.7 % (ref 36.0–46.0)
Hemoglobin: 13.6 g/dL (ref 12.0–15.0)
Immature Granulocytes: 0 %
Lymphocytes Relative: 18 %
Lymphs Abs: 1.3 10*3/uL (ref 0.7–4.0)
MCH: 30.4 pg (ref 26.0–34.0)
MCHC: 32.6 g/dL (ref 30.0–36.0)
MCV: 93.1 fL (ref 80.0–100.0)
Monocytes Absolute: 0.9 10*3/uL (ref 0.1–1.0)
Monocytes Relative: 11 %
Neutro Abs: 5.3 10*3/uL (ref 1.7–7.7)
Neutrophils Relative %: 71 %
Platelets: 217 10*3/uL (ref 150–400)
RBC: 4.48 MIL/uL (ref 3.87–5.11)
RDW: 15.5 % (ref 11.5–15.5)
WBC: 7.6 10*3/uL (ref 4.0–10.5)
nRBC: 0 % (ref 0.0–0.2)

## 2018-12-12 LAB — PROTIME-INR
INR: 2.4 — ABNORMAL HIGH (ref 0.8–1.2)
Prothrombin Time: 26 seconds — ABNORMAL HIGH (ref 11.4–15.2)

## 2018-12-12 LAB — FERRITIN: Ferritin: 380 ng/mL — ABNORMAL HIGH (ref 11–307)

## 2018-12-12 LAB — COMPREHENSIVE METABOLIC PANEL
ALT: 25 U/L (ref 0–44)
AST: 36 U/L (ref 15–41)
Albumin: 3 g/dL — ABNORMAL LOW (ref 3.5–5.0)
Alkaline Phosphatase: 44 U/L (ref 38–126)
Anion gap: 13 (ref 5–15)
BUN: 39 mg/dL — ABNORMAL HIGH (ref 8–23)
CO2: 24 mmol/L (ref 22–32)
Calcium: 8.4 mg/dL — ABNORMAL LOW (ref 8.9–10.3)
Chloride: 100 mmol/L (ref 98–111)
Creatinine, Ser: 1.09 mg/dL — ABNORMAL HIGH (ref 0.44–1.00)
GFR calc Af Amer: 56 mL/min — ABNORMAL LOW (ref >60)
GFR calc non Af Amer: 48 mL/min — ABNORMAL LOW (ref >60)
Glucose, Bld: 162 mg/dL — ABNORMAL HIGH (ref 70–99)
Potassium: 3.7 mmol/L (ref 3.5–5.1)
Sodium: 137 mmol/L (ref 135–145)
Total Bilirubin: 1.1 mg/dL (ref 0.3–1.2)
Total Protein: 6.8 g/dL (ref 6.5–8.1)

## 2018-12-12 LAB — D-DIMER, QUANTITATIVE: D-Dimer, Quant: 0.82 ug/mL-FEU — ABNORMAL HIGH (ref 0.00–0.50)

## 2018-12-12 LAB — GLUCOSE, CAPILLARY
Glucose-Capillary: 148 mg/dL — ABNORMAL HIGH (ref 70–99)
Glucose-Capillary: 199 mg/dL — ABNORMAL HIGH (ref 70–99)
Glucose-Capillary: 308 mg/dL — ABNORMAL HIGH (ref 70–99)
Glucose-Capillary: 280 mg/dL — ABNORMAL HIGH (ref 70–99)
Glucose-Capillary: 207 mg/dL — ABNORMAL HIGH (ref 70–99)

## 2018-12-12 LAB — URINE CULTURE: Culture: >=100000 — AB

## 2018-12-12 LAB — C-REACTIVE PROTEIN: CRP: 18.7 mg/dL — ABNORMAL HIGH (ref <1.0)

## 2018-12-12 LAB — MAGNESIUM: Magnesium: 2 mg/dL (ref 1.7–2.4)

## 2018-12-12 MED ORDER — FUROSEMIDE 20 MG PO TABS
20.0000 mg | ORAL_TABLET | Freq: Two times a day (BID) | ORAL | Status: DC
  Administered 2018-12-13 – 2018-12-14 (×3): 20 mg via ORAL
  Filled 2018-12-12 (×4): qty 1

## 2018-12-12 MED ORDER — CLONAZEPAM 1 MG PO TABS
1.0000 mg | ORAL_TABLET | Freq: Every day | ORAL | Status: DC
  Administered 2018-12-12 – 2018-12-13 (×2): 1 mg via ORAL
  Filled 2018-12-12 (×2): qty 1

## 2018-12-12 MED ORDER — QUETIAPINE FUMARATE 25 MG PO TABS
50.0000 mg | ORAL_TABLET | Freq: Every day | ORAL | Status: DC
  Administered 2018-12-12 – 2018-12-13 (×2): 50 mg via ORAL
  Filled 2018-12-12 (×2): qty 2

## 2018-12-12 MED ORDER — CARVEDILOL 3.125 MG PO TABS: 3.1250 mg | ORAL_TABLET | Freq: Two times a day (BID) | ORAL | Status: DC

## 2018-12-12 MED ORDER — INFLUENZA VAC A&B SA ADJ QUAD 0.5 ML IM PRSY
0.5000 mL | PREFILLED_SYRINGE | INTRAMUSCULAR | Status: AC | PRN
  Filled 2018-12-12: qty 0.5

## 2018-12-12 MED ORDER — WARFARIN SODIUM 2.5 MG PO TABS
2.5000 mg | ORAL_TABLET | Freq: Once | ORAL | Status: AC
  Administered 2018-12-12: 2.5 mg via ORAL
  Filled 2018-12-12: qty 1

## 2018-12-12 MED ORDER — LACTATED RINGERS IV SOLN
INTRAVENOUS | Status: AC
  Administered 2018-12-12: 10:00:00 via INTRAVENOUS

## 2018-12-12 NOTE — Progress Notes (Signed)
Chart reviewed and patient discussed with Dr Candiss Norse.  She is stable from a CV standpoint.  Cardiology to see as needed this weekend.  Thompson Grayer MD, [place] 12/12/2018 11:20 AM

## 2018-12-12 NOTE — Evaluation (Signed)
OT Cancellation Note  Patient Details Name: ALINNA SIPLE MRN: [id number] DOB: 24-Mar-1940   Cancelled Treatment:    Reason Eval/Treat Not Completed: Other (comment) Per RN, pt is very lethargic this date and not yet ready to participate in OT Eval. Will continue to follow as available and appropriate to initiate OT POC.   Zenovia Jarred, MSOT, OTR/L Behavioral Health OT/ Acute Relief OT [facility] Office: [phone] [place]: [phone]  Zenovia Jarred 12/12/2018, 1:26 PM

## 2018-12-12 NOTE — Progress Notes (Signed)
PT Cancellation Note  Patient Details Name: Rebecca [name] MRN: [id number] DOB: Apr 09, 1940   Cancelled Treatment:    Reason Eval/Treat Not Completed: Patient's level of consciousness                                           Pt is lethargic and unable to participate in skilled PT assessment and tx at this time. Will f/u at next available time.  Horald Chestnut, PT    Delford Field 12/12/2018, 2:03 PM

## 2018-12-12 NOTE — Progress Notes (Signed)
[place] for warfarin Indication: atrial fibrillation  Allergies  Allergen Reactions  . Codeine Anxiety  . Compazine Anaphylaxis  . Other Anaphylaxis, Rash and Other (See Comments)    Uncoded Allergy. Allergen: INOVAR Uncoded Allergy. Allergen: ALL ADHESIVES Uncoded Allergy. Allergen: SILK SUTURE Uncoded Allergy. Allergen: merthiolate Thermasol  . Penicillins Shortness Of Breath and Rash    Has patient had a PCN reaction causing immediate rash, facial/tongue/throat swelling, SOB or lightheadedness with hypotension: Yes Has patient had a PCN reaction causing severe rash involving mucus membranes or skin necrosis: No Has patient had a PCN reaction that required hospitalization No Has patient had a PCN reaction occurring within the last 10 years: Yes If all of the above answers are "NO", then may proceed with Cephalosporin use.   Marland Kitchen Morphine And Related Other (See Comments)    Patient states that it makes her lose her state of mind.   . Demerol [Meperidine] Rash    Patient Measurements: Height: 5\' 8"  (172.7 cm) Weight: 238 lb 12.1 oz (108.3 kg) IBW/kg (Calculated) : 63.9 Heparin Dosing Weight: 88 kg  Vital Signs: Temp: 97.7 F (36.5 C) (11/07 1133) Temp Source: Oral (11/07 1133) BP: 134/65 (11/07 1133) Pulse Rate: 60 (11/07 1133)  Labs: Recent Labs    12/10/18 1139 12/11/18 0948 12/12/18 0632  HGB  --  14.5 13.6  HCT  --  44.5 41.7  PLT  --  239 217  LABPROT 22.8* 24.9* 26.0*  INR 2.1* 2.3* 2.4*  CREATININE  --  0.98 1.09*    Estimated Creatinine Clearance: 54 mL/min (A) (by C-G formula based on SCr of 1.09 mg/dL (H)).  Assessment: 89 YOF on warfarin PTA for Afib/recurrent DVT. Was briefly transitioned to IV heparin for ACS. Now resumed on warfarin per cardiology.  Home warfarin dose: Alternating 2.5 mg and 5 mg every other day  - INR remains therapeutic at 2.4 today. H/H and Plt wnl - major DDI with Levaquin started  11/5  Goal of Therapy:  INR 2-3 Monitor platelets by anticoagulation protocol: Yes   Plan:  Warfarin 2.5 mg PO x1  Daily INR  Gretta Arab PharmD, BCPS Clinical pharmacist phone 7am- 5pm: [phone] 12/12/2018 1:51 PM

## 2018-12-12 NOTE — Progress Notes (Signed)
PROGRESS NOTE                                                                                                                                                                                                             Patient Demographics:    Rebecca BuffRena Hornik, is a 78 y.o. female, DOB - [birth year], WUJ:[id number]  Outpatient Primary MD for the patient is Oval Linseyondiego, Richard, MD   Admit date - 12/08/2018   LOS - 4  Chief Complaint  Patient presents with  . Shortness of Breath       Brief Narrative: Patient is a 78 y.o. 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   Subjective:   Patient in bed somnolent but denies any headache chest or abdominal pain.  No shortness of breath.   Assessment  & Plan :   Acute Hypoxic Resp Failure due to Covid 19 Viral pneumonia: She has been adequately treated with IV steroids and remdesivir initially was requiring 6 L nasal cannula oxygen currently down to 2.  Continue present line of care.  Will monitor inflammatory markers closely as they are rising.     COVID-19 Labs: Recent Labs    12/11/18 0948 12/12/18 0632  DDIMER 0.87*  --   FERRITIN 505* 380*  CRP 7.1* 18.7*    Lab Results  Component Value Date   SARSCOV2NAA POSITIVE (A) 12/08/2018   SARSCOV2NAA NEGATIVE 09/03/2018     COVID-19 Medications: Steroids: 11/3>> Remdesivir: 11/4>> Actemra: Not indicated-clinically improving  Convalescent Plasma: Not indicated-clinically improving Research Studies:N/A    Anxiety and depression.  Currently extremely somnolent, sedative medications held.  Discussed the plan with her daughter.  daughter 11/7 - understands holding sedatives, warns  she gets mean and has a narcissistic personality.  Daughter states that she understands that patient sedatives have to be held and she agrees with the plan.   UTI.  Noted cultures.  Currently on Levaquin will stop after 5 doses.    Elevated troponin secondary to demand ischemia: Evaluated by cardiology-continue supportive care.  Chronic diastolic heart failure: Euvolemic-Lasix  History of complete heart block-s/p permanent pacemaker placement September 2018: Continue telemetry monitoring  Persistent atrial  fibrillation: Rate controlled-with Coreg-on Coumadin.  Aortic stenosis/'s mitral stenosis: Stable for outpatient follow-up  History of recurrent venous thromboembolism: INR therapeutic-Coumadin per pharmacy  Lab Results  Component Value Date   INR 2.3 (H) 12/11/2018   INR 2.1 (H) 12/10/2018   INR 2.1 (H) 12/09/2018    DM-2: CBGs relatively stable-continue Lantus 20 units and SSI  CBG (last 3)  Recent Labs    12/11/18 1536 12/11/18 2132 12/12/18 0731  GLUCAP 210* 182* 148*   Narcolepsy/cataplexy/insomnia: Continue clomipramine, mirtazapine, and home dosage of Klonopin at bedtime  Depression: Stable-continue with Cymbalta and Seroquel  Obesity: BMI, follow with PCP for weight loss.    Condition - Stable  Family Communication  : daughter 11/7 - understands holding sedatives, warns she gets mean and has a narcissistic personality.  Daughter states that she understands that patient sedatives have to be held and she agrees with the plan.  Code Status :  Full Code  Diet :  Diet Order            Diet NPO time specified Except for: Sips with Meds  Diet effective now               Disposition Plan  :  Remain hospitalized - HHPT  Barriers to discharge: Hypoxia requiring O2 supplementation/complete 5 days of IV Remdesivir  Consults  :  None  Procedures  :  None   Antibiotics  :    Anti-infectives (From admission, onward)   Start     Dose/Rate Route Frequency  Ordered Stop   12/10/18 1600  remdesivir 100 mg in sodium chloride 0.9 % 250 mL IVPB     100 mg 500 mL/hr over 30 Minutes Intravenous Every 24 hours 12/09/18 1431 12/14/18 1559   12/10/18 1400  levofloxacin (LEVAQUIN) IVPB 500 mg     500 mg 100 mL/hr over 60 Minutes Intravenous Every 24 hours 12/10/18 1308 12/15/18 1359   12/09/18 1600  remdesivir 200 mg in sodium chloride 0.9 % 250 mL IVPB     200 mg 500 mL/hr over 30 Minutes Intravenous Once 12/09/18 1431 12/09/18 1658      DVT Prophylaxis  : Coumadin  Inpatient Medications  Scheduled Meds: . aspirin EC  81 mg Oral Daily  . carvedilol  3.125 mg Oral BID WC  . clomiPRAMINE  25 mg Oral QID  . clonazePAM  1 mg Oral QHS  . dexamethasone (DECADRON) injection  6 mg Intravenous Q24H  . DULoxetine  60 mg Oral Daily  . [START ON 12/13/2018] furosemide  20 mg Oral BID  . gabapentin  300 mg Oral BID  . influenza vaccine adjuvanted  0.5 mL Intramuscular Tomorrow-1000  . insulin aspart  0-20 Units Subcutaneous TID WC  . insulin aspart  0-5 Units Subcutaneous QHS  . insulin glargine  20 Units Subcutaneous Daily  . Ipratropium-Albuterol  2 puff Inhalation Q6H  . pneumococcal 23 valent vaccine  0.5 mL Intramuscular Tomorrow-1000  . QUEtiapine  50 mg Oral QHS  . vitamin C  500 mg Oral Daily  . Warfarin - Pharmacist Dosing Inpatient   Does not apply q1800  . zinc sulfate  220 mg Oral Daily   Continuous Infusions: . lactated ringers    . levofloxacin (LEVAQUIN) IV 500 mg (12/11/18 1500)  . remdesivir 100 mg in NS 250 mL 100 mg (12/11/18 1635)   PRN Meds:.acetaminophen **OR** acetaminophen, albuterol, guaiFENesin-dextromethorphan, [DISCONTINUED] ondansetron **OR** ondansetron (ZOFRAN) IV   Time Spent in minutes  25  See  all Orders from today for further details   Lala Lund M.D on 12/12/2018 at 9:54 AM  To page go to [email or web address] - use universal password  [facility] -  Office  [phone]    Objective:   Vitals:    12/11/18 1537 12/11/18 1934 12/12/18 0400 12/12/18 0715  BP: 139/70 118/62 (!) 141/58 (!) 136/91  Pulse: 71 61 60 (!) 59  Resp: (!) 21 20 (!) 38 18  Temp: 98 F (36.7 C) 98.8 F (37.1 C)  97.7 F (36.5 C)  TempSrc: Oral Oral  Oral  SpO2: 94% 93% 94% 97%  Weight:      Height:        Wt Readings from Last 3 Encounters:  12/08/18 108.3 kg  09/07/18 108.3 kg  03/02/18 99.8 kg     Intake/Output Summary (Last 24 hours) at 12/12/2018 0954 Last data filed at 12/12/2018 0831 Gross per 24 hour  Intake 100 ml  Output 650 ml  Net -550 ml     Physical Exam  Somnolent but arousable, No new F.N deficits, Normal affect [place].AT,PERRAL Supple Neck,No JVD, No cervical lymphadenopathy appriciated.  Symmetrical Chest wall movement, Good air movement bilaterally, CTAB RRR,No Gallops, Rubs or new Murmurs, No Parasternal Heave +ve B.Sounds, Abd Soft, No tenderness, No organomegaly appriciated, No rebound - guarding or rigidity. No Cyanosis, Clubbing or edema, No new Rash or bruise    Data Review:    CBC Recent Labs  Lab 12/08/18 2130 12/09/18 0534 12/11/18 0948 12/12/18 0632  WBC 8.3 7.6 12.6* 7.6  HGB 13.4 13.1 14.5 13.6  HCT 42.2 41.0 44.5 41.7  PLT 231 227 239 217  MCV 94.8 94.9 93.7 93.1  MCH 30.1 30.3 30.5 30.4  MCHC 31.8 32.0 32.6 32.6  RDW 15.7* 15.5 15.5 15.5  LYMPHSABS 0.5* 0.5* 1.0 1.3  MONOABS 0.7 0.1 1.0 0.9  EOSABS 0.0 0.0 0.0 0.0  BASOSABS 0.0 0.0 0.0 0.0    Chemistries  Recent Labs  Lab 12/08/18 2113 12/08/18 2346 12/09/18 0534 12/11/18 0948 12/12/18 0632  NA 133*  --  134* 135 137  K 4.6  --  4.4 4.2 3.7  CL 97*  --  98 99 100  CO2 21*  --  22 25 24   GLUCOSE 213*  --  261* 196* 162*  BUN 26*  --  34* 37* 39*  CREATININE 1.11*  --  1.21* 0.98 1.09*  CALCIUM 8.6*  --  8.6* 8.5* 8.4*  MG  --  2.2  --  1.9 2.0  AST 29  --  47* 56* 36  ALT 21  --  23 30 25   ALKPHOS 54  --  50 51 44  BILITOT 0.8  --  0.6 1.3* 1.1    ------------------------------------------------------------------------------------------------------------------ No results for input(s): CHOL, HDL, LDLCALC, TRIG, CHOLHDL, LDLDIRECT in the last 72 hours.  Lab Results  Component Value Date   HGBA1C 9.2 (H) 09/04/2018   ------------------------------------------------------------------------------------------------------------------ No results for input(s): TSH, T4TOTAL, T3FREE, THYROIDAB in the last 72 hours.  Invalid input(s): FREET3 ------------------------------------------------------------------------------------------------------------------ Recent Labs    12/11/18 0948 12/12/18 0632  FERRITIN 505* 380*    Coagulation profile Recent Labs  Lab 12/09/18 0534 12/09/18 1514 12/10/18 1139 12/11/18 0948  INR 2.1* 2.1* 2.1* 2.3*    Recent Labs    12/11/18 0948  DDIMER 0.87*    Cardiac Enzymes No results for input(s): CKMB, TROPONINI, MYOGLOBIN in the last 168 hours.  Invalid input(s): CK ------------------------------------------------------------------------------------------------------------------    Component Value Date/Time  BNP 950.0 (H) 12/08/2018 2130    Micro Results Recent Results (from the past 240 hour(s))  SARS Coronavirus 2 by RT PCR (hospital order, performed in [facility] hospital lab) Nasopharyngeal Nasopharyngeal Swab     Status: Abnormal   Collection Time: 12/08/18  8:12 PM   Specimen: Nasopharyngeal Swab  Result Value Ref Range Status   SARS Coronavirus 2 POSITIVE (A) NEGATIVE Final    Comment: RESULT CALLED TO, READ BACK BY AND VERIFIED WITH: BELTON,K @ 2155 ON 12/08/18 BY JUW Performed at [facility], [address] [place], [place] [address]   Urine culture     Status: Abnormal   Collection Time: 12/08/18  8:46 PM   Specimen: Urine, Catheterized  Result Value Ref Range Status   Specimen Description   Final    URINE, CATHETERIZED Performed at [facility], [address]  [place], [place] [address]    Special Requests   Final    NONE Performed at [facility], [address] [place], [place] [address]    Culture (A)  Final    >=100,000 COLONIES/mL ESCHERICHIA COLI >=100,000 COLONIES/mL AEROCOCCUS URINAE Standardized susceptibility testing for this organism is not available. Performed at [facility], [address], [place], [place] [address]    Report Status 12/12/2018 FINAL  Final   Organism ID, Bacteria ESCHERICHIA COLI (A)  Final      Susceptibility   Escherichia coli - MIC*    AMPICILLIN 4 SENSITIVE Sensitive     CEFAZOLIN <=4 SENSITIVE Sensitive     CEFTRIAXONE <=1 SENSITIVE Sensitive     CIPROFLOXACIN <=0.25 SENSITIVE Sensitive     GENTAMICIN <=1 SENSITIVE Sensitive     IMIPENEM <=0.25 SENSITIVE Sensitive     NITROFURANTOIN 32 SENSITIVE Sensitive     TRIMETH/SULFA <=20 SENSITIVE Sensitive     AMPICILLIN/SULBACTAM <=2 SENSITIVE Sensitive     PIP/TAZO <=4 SENSITIVE Sensitive     Extended ESBL NEGATIVE Sensitive     * >=100,000 COLONIES/mL ESCHERICHIA COLI  Blood Culture (routine x 2)     Status: None (Preliminary result)   Collection Time: 12/08/18  9:13 PM   Specimen: BLOOD RIGHT ARM  Result Value Ref Range Status   Specimen Description BLOOD RIGHT ARM  Final   Special Requests   Final    BOTTLES DRAWN AEROBIC AND ANAEROBIC Blood Culture adequate volume   Culture   Final    NO GROWTH 4 DAYS Performed at [facility], [address] [place], [place] [address]    Report Status PENDING  Incomplete  Blood Culture (routine x 2)     Status: None (Preliminary result)   Collection Time: 12/08/18  9:30 PM   Specimen: BLOOD RIGHT HAND  Result Value Ref Range Status   Specimen Description BLOOD RIGHT HAND  Final   Special Requests   Final    BOTTLES DRAWN AEROBIC ONLY Blood Culture adequate volume   Culture   Final    NO GROWTH 4 DAYS Performed at [facility], [address] [place], [place] [address]    Report Status PENDING  Incomplete   MRSA PCR Screening     Status: None   Collection Time: 12/10/18  6:38 AM   Specimen: Nasopharyngeal  Result Value Ref Range Status   MRSA by PCR NEGATIVE NEGATIVE Final    Comment:        The GeneXpert MRSA Assay (FDA approved for NASAL specimens only), is one component of a comprehensive MRSA colonization surveillance program. It is  not intended to diagnose MRSA infection nor to guide or monitor treatment for MRSA infections. Performed at [facility], [address] [place], [place] [address]     Radiology Reports  Dg Chest Port 1 View  Result Date: 12/08/2018 CLINICAL DATA:  Shortness of breath for 2 days. EXAM: PORTABLE CHEST 1 VIEW COMPARISON:  Chest radiograph 09/03/2018 and CT 09/04/2018 FINDINGS: A dual lead pacemaker remains in place. The cardiac silhouette remains enlarged. Hazy airspace opacity is again seen in both lungs, slightly worse than on the prior study. The interstitial markings are mildly increased diffusely. No sizable pleural effusion or pneumothorax is identified. No acute osseous abnormality is seen. Aortic atherosclerosis is noted. IMPRESSION: Cardiomegaly with bilateral airspace opacities which are mildly increased compared to 09/03/2018 and may reflect edema or infection. Electronically Signed   By: Sebastian Ache M.D.   On: 12/08/2018 20:56

## 2018-12-13 LAB — COMPREHENSIVE METABOLIC PANEL
ALT: 26 U/L (ref 0–44)
AST: 28 U/L (ref 15–41)
Albumin: 2.8 g/dL — ABNORMAL LOW (ref 3.5–5.0)
Alkaline Phosphatase: 43 U/L (ref 38–126)
Anion gap: 12 (ref 5–15)
BUN: 36 mg/dL — ABNORMAL HIGH (ref 8–23)
CO2: 24 mmol/L (ref 22–32)
Calcium: 8.3 mg/dL — ABNORMAL LOW (ref 8.9–10.3)
Chloride: 101 mmol/L (ref 98–111)
Creatinine, Ser: 0.98 mg/dL (ref 0.44–1.00)
GFR calc Af Amer: >60 mL/min (ref >60)
GFR calc non Af Amer: 55 mL/min — ABNORMAL LOW (ref >60)
Glucose, Bld: 207 mg/dL — ABNORMAL HIGH (ref 70–99)
Potassium: 3.8 mmol/L (ref 3.5–5.1)
Sodium: 137 mmol/L (ref 135–145)
Total Bilirubin: 0.7 mg/dL (ref 0.3–1.2)
Total Protein: 6.6 g/dL (ref 6.5–8.1)

## 2018-12-13 LAB — CBC WITH DIFFERENTIAL/PLATELET
Abs Immature Granulocytes: 0.01 10*3/uL (ref 0.00–0.07)
Basophils Absolute: 0 10*3/uL (ref 0.0–0.1)
Basophils Relative: 0 %
Eosinophils Absolute: 0 10*3/uL (ref 0.0–0.5)
Eosinophils Relative: 0 %
HCT: 38.9 % (ref 36.0–46.0)
Hemoglobin: 12.7 g/dL (ref 12.0–15.0)
Immature Granulocytes: 0 %
Lymphocytes Relative: 20 %
Lymphs Abs: 0.7 10*3/uL (ref 0.7–4.0)
MCH: 30.2 pg (ref 26.0–34.0)
MCHC: 32.6 g/dL (ref 30.0–36.0)
MCV: 92.4 fL (ref 80.0–100.0)
Monocytes Absolute: 0.5 10*3/uL (ref 0.1–1.0)
Monocytes Relative: 13 %
Neutro Abs: 2.5 10*3/uL (ref 1.7–7.7)
Neutrophils Relative %: 67 %
Platelets: 200 10*3/uL (ref 150–400)
RBC: 4.21 MIL/uL (ref 3.87–5.11)
RDW: 15.2 % (ref 11.5–15.5)
WBC: 3.7 10*3/uL — ABNORMAL LOW (ref 4.0–10.5)
nRBC: 0 % (ref 0.0–0.2)

## 2018-12-13 LAB — FERRITIN: Ferritin: 401 ng/mL — ABNORMAL HIGH (ref 11–307)

## 2018-12-13 LAB — C-REACTIVE PROTEIN: CRP: 19.3 mg/dL — ABNORMAL HIGH (ref <1.0)

## 2018-12-13 LAB — PROTIME-INR
INR: 2.7 — ABNORMAL HIGH (ref 0.8–1.2)
Prothrombin Time: 28.1 seconds — ABNORMAL HIGH (ref 11.4–15.2)

## 2018-12-13 LAB — GLUCOSE, CAPILLARY
Glucose-Capillary: 162 mg/dL — ABNORMAL HIGH (ref 70–99)
Glucose-Capillary: 210 mg/dL — ABNORMAL HIGH (ref 70–99)
Glucose-Capillary: 324 mg/dL — ABNORMAL HIGH (ref 70–99)
Glucose-Capillary: 321 mg/dL — ABNORMAL HIGH (ref 70–99)

## 2018-12-13 LAB — MAGNESIUM: Magnesium: 2 mg/dL (ref 1.7–2.4)

## 2018-12-13 LAB — BRAIN NATRIURETIC PEPTIDE: B Natriuretic Peptide: 948.5 pg/mL — ABNORMAL HIGH (ref 0.0–100.0)

## 2018-12-13 LAB — D-DIMER, QUANTITATIVE: D-Dimer, Quant: 0.54 ug/mL-FEU — ABNORMAL HIGH (ref 0.00–0.50)

## 2018-12-13 MED ORDER — WARFARIN SODIUM 1 MG PO TABS
1.5000 mg | ORAL_TABLET | Freq: Once | ORAL | Status: AC
  Administered 2018-12-13: 1.5 mg via ORAL
  Filled 2018-12-13: qty 1

## 2018-12-13 MED ORDER — LEVOFLOXACIN 500 MG PO TABS
500.0000 mg | ORAL_TABLET | ORAL | Status: AC
  Administered 2018-12-13 – 2018-12-14 (×2): 500 mg via ORAL
  Filled 2018-12-13 (×2): qty 1

## 2018-12-13 NOTE — Progress Notes (Signed)
[place] for warfarin Indication: atrial fibrillation  Allergies  Allergen Reactions  . Codeine Anxiety  . Compazine Anaphylaxis  . Other Anaphylaxis, Rash and Other (See Comments)    Uncoded Allergy. Allergen: INOVAR Uncoded Allergy. Allergen: ALL ADHESIVES Uncoded Allergy. Allergen: SILK SUTURE Uncoded Allergy. Allergen: merthiolate Thermasol  . Penicillins Shortness Of Breath and Rash    Has patient had a PCN reaction causing immediate rash, facial/tongue/throat swelling, SOB or lightheadedness with hypotension: Yes Has patient had a PCN reaction causing severe rash involving mucus membranes or skin necrosis: No Has patient had a PCN reaction that required hospitalization No Has patient had a PCN reaction occurring within the last 10 years: Yes If all of the above answers are "NO", then may proceed with Cephalosporin use.   Marland Kitchen Morphine And Related Other (See Comments)    Patient states that it makes her lose her state of mind.   . Demerol [Meperidine] Rash    Patient Measurements: Height: 5\' 8"  (172.7 cm) Weight: 238 lb 12.1 oz (108.3 kg) IBW/kg (Calculated) : 63.9 Heparin Dosing Weight: 88 kg  Vital Signs: Temp: 97.6 F (36.4 C) (11/08 0727) Temp Source: Axillary (11/08 0727) BP: 130/66 (11/08 0727) Pulse Rate: 62 (11/08 0727)  Labs: Recent Labs    12/11/18 0948 12/12/18 0632 12/13/18 0016  HGB 14.5 13.6 12.7  HCT 44.5 41.7 38.9  PLT 239 217 200  LABPROT 24.9* 26.0* 28.1*  INR 2.3* 2.4* 2.7*  CREATININE 0.98 1.09* 0.98    Estimated Creatinine Clearance: 60 mL/min (by C-G formula based on SCr of 0.98 mg/dL).  Assessment: 46 YOF on warfarin PTA for Afib/recurrent DVT. Was briefly transitioned to IV heparin for ACS. Now resumed on warfarin per cardiology.  Home warfarin dose: Alternating 2.5 mg and 5 mg every other day  - INR remains therapeutic at 2.7 today - trending up. H/H and Plt wnl - major DDI with Levaquin  started 11/5, ends 11/9  Goal of Therapy:  INR 2-3 Monitor platelets by anticoagulation protocol: Yes   Plan:  Warfarin 1.5 mg PO x1 today Likely can go back on home dose once Levaquin off Daily INR  Sherlon Handing, PharmD, BCPS CGV Clinical pharmacist phone [phone] 12/13/2018 8:23 AM   PHARMACIST - PHYSICIAN COMMUNICATION DR:   Candiss Norse and colleagues CONCERNING: Antibiotic IV to Oral Route Change Policy  RECOMMENDATION: This patient is receiving Levaquin by the intravenous route.  Based on criteria approved by the Pharmacy and Therapeutics Committee, the antibiotic(s) is/are being converted to the equivalent oral dose form(s).   DESCRIPTION: These criteria include:  Patient being treated for a respiratory tract infection, urinary tract infection, cellulitis or clostridium difficile associated diarrhea if on metronidazole  The patient is not neutropenic and does not exhibit a GI malabsorption state  The patient is eating (either orally or via tube) and/or has been taking other orally administered medications for a least 24 hours  The patient is improving clinically and has a Tmax < 100.5  Sherlon Handing, PharmD, BCPS CGV Clinical pharmacist phone [phone] 12/13/2018 8:25 AM

## 2018-12-13 NOTE — Progress Notes (Signed)
PROGRESS NOTE                                                                                                                                                                                                             Patient Demographics:    Rebecca [name], is a 78 y.o. female, DOB - 1940-05-13, IWO:[id number]  Outpatient Primary MD for the patient is [name], MD   Admit date - 12/08/2018   LOS - 5  Chief Complaint  Patient presents with   Shortness of Breath       Brief Narrative: Patient is a 78 y.o. 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   Subjective:   Patient in bed, appears comfortable, denies any headache, no fever, no chest pain or pressure, no shortness of breath , no abdominal pain. No focal weakness.   Assessment  & Plan :   Acute Hypoxic Resp Failure due to Covid 19 Viral pneumonia: She has been adequately treated with IV steroids and remdesivir initially was requiring 6 L nasal cannula oxygen currently down to 2.  Continue present line of care.  Will monitor inflammatory markers closely as they are rising.   SpO2: 100 % O2 Flow Rate (L/min): 1 L/min FiO2 (%): 80 %    COVID-19 Labs: Recent Labs    12/11/18 0948 12/12/18 0632 12/13/18 0016  DDIMER 0.87* 0.82* 0.54*  FERRITIN 505* 380* 401*  CRP 7.1* 18.7* 19.3*    Lab Results  Component Value Date   SARSCOV2NAA POSITIVE (A) 12/08/2018   SARSCOV2NAA NEGATIVE 09/03/2018     COVID-19 Medications: Steroids: 11/3>> Remdesivir: 11/4>> Actemra: Not indicated-clinically improving  Convalescent Plasma: Not indicated-clinically improving Research Studies:N/A    Anxiety and depression.   Had become excessively sedated due to overuse of medications on 12/12/2018, medications were held doses reduced, mentation much improved.  Patient has been counseled on medication dose and frequency understands and accepts the plan.  Also daughter was updated on this on 12/12/2018.  UTI.  Noted cultures.  Currently on Levaquin will stop after 5 doses.    Elevated troponin secondary to demand ischemia: Evaluated by cardiology-continue supportive care.  Chronic diastolic  heart failure: Euvolemic-Lasix  History of complete heart block-s/p permanent pacemaker placement September 2018: Continue telemetry monitoring  Persistent atrial fibrillation: Rate controlled-with Coreg-on Coumadin.  Aortic stenosis/'s mitral stenosis: Stable for outpatient follow-up  History of recurrent venous thromboembolism: INR therapeutic-Coumadin per pharmacy  Lab Results  Component Value Date   INR 2.7 (H) 12/13/2018   INR 2.4 (H) 12/12/2018   INR 2.3 (H) 12/11/2018    DM-2: CBGs relatively stable-continue Lantus 20 units and SSI  CBG (last 3)  Recent Labs    12/12/18 1742 12/12/18 2124 12/13/18 0725  GLUCAP 280* 207* 162*   Narcolepsy/cataplexy/insomnia: Continue clomipramine, mirtazapine, and home dosage of Klonopin at bedtime  Depression: Stable-continue with Cymbalta and Seroquel  Obesity: BMI, follow with PCP for weight loss.    Condition - Stable  Family Communication  : daughter 11/7 - understands holding sedatives, warns she gets mean and has a narcissistic personality.  Daughter states that she understands that patient sedatives have to be held and she agrees with the plan.  Code Status :  Full Code  Diet :  Diet Order            DIET SOFT Room service appropriate? Yes; Fluid consistency: Thin  Diet effective now               Disposition Plan  :  Remain hospitalized - HHPT  Barriers to discharge: Hypoxia requiring O2 supplementation/complete 5 days of IV Remdesivir,  12/15/18  Consults  :  None  Procedures  :  None   Antibiotics  :    Anti-infectives (From admission, onward)   Start     Dose/Rate Route Frequency Ordered Stop   12/13/18 1400  levofloxacin (LEVAQUIN) tablet 500 mg     500 mg Oral Every 24 hours 12/13/18 0823 12/15/18 1359   12/10/18 1600  remdesivir 100 mg in sodium chloride 0.9 % 250 mL IVPB     100 mg 500 mL/hr over 30 Minutes Intravenous Every 24 hours 12/09/18 1431 12/14/18 1559   12/10/18 1400  levofloxacin (LEVAQUIN) IVPB 500 mg  Status:  Discontinued     500 mg 100 mL/hr over 60 Minutes Intravenous Every 24 hours 12/10/18 1308 12/13/18 0823   12/09/18 1600  remdesivir 200 mg in sodium chloride 0.9 % 250 mL IVPB     200 mg 500 mL/hr over 30 Minutes Intravenous Once 12/09/18 1431 12/09/18 1658      DVT Prophylaxis  : Coumadin  Inpatient Medications  Scheduled Meds:  aspirin EC  81 mg Oral Daily   carvedilol  3.125 mg Oral BID WC   clomiPRAMINE  25 mg Oral QID   clonazePAM  1 mg Oral QHS   dexamethasone (DECADRON) injection  6 mg Intravenous Q24H   DULoxetine  60 mg Oral Daily   furosemide  20 mg Oral BID   gabapentin  300 mg Oral BID   insulin aspart  0-20 Units Subcutaneous TID WC   insulin aspart  0-5 Units Subcutaneous QHS   insulin glargine  20 Units Subcutaneous Daily   Ipratropium-Albuterol  2 puff Inhalation Q6H   levofloxacin  500 mg Oral Q24H   pneumococcal 23 valent vaccine  0.5 mL Intramuscular Tomorrow-1000   QUEtiapine  50 mg Oral QHS   vitamin C  500 mg Oral Daily   warfarin  1.5 mg Oral ONCE-1800   Warfarin - Pharmacist Dosing Inpatient   Does not apply q1800   zinc sulfate  220 mg Oral Daily   Continuous  Infusions:  remdesivir 100 mg in NS 250 mL 100 mg (12/12/18 1626)   PRN Meds:.acetaminophen **OR** acetaminophen, albuterol, guaiFENesin-dextromethorphan, influenza vaccine adjuvanted, [DISCONTINUED] ondansetron **OR** ondansetron (ZOFRAN) IV   Time Spent in minutes   25  See all Orders from today for further details   Lala Lund M.D on 12/13/2018 at 9:16 AM  To page go to [email or web address] - use universal password  [facility] -  Office  [phone]    Objective:   Vitals:   12/12/18 1932 12/13/18 0000 12/13/18 0400 12/13/18 0727  BP: (!) 141/70 (!) 123/104 116/64 130/66  Pulse: 65 (!) 59 (!) 58 62  Resp: 20 20 18  (!) 25  Temp: (!) 97.4 F (36.3 C) (!) 97.1 F (36.2 C) (!) 97.4 F (36.3 C) 97.6 F (36.4 C)  TempSrc: Axillary Axillary Axillary Axillary  SpO2: 96% 92% 96% 100%  Weight:      Height:        Wt Readings from Last 3 Encounters:  12/08/18 108.3 kg  09/07/18 108.3 kg  03/02/18 99.8 kg     Intake/Output Summary (Last 24 hours) at 12/13/2018 0916 Last data filed at 12/12/2018 1600 Gross per 24 hour  Intake 504.15 ml  Output 300 ml  Net 204.15 ml     Physical Exam  Awake Alert,   No new F.N deficits, Normal affect [place].AT,PERRAL Supple Neck,No JVD, No cervical lymphadenopathy appriciated.  Symmetrical Chest wall movement, Good air movement bilaterally, CTAB RRR,No Gallops, Rubs or new Murmurs, No Parasternal Heave +ve B.Sounds, Abd Soft, No tenderness, No organomegaly appriciated, No rebound - guarding or rigidity. No Cyanosis, Clubbing or edema, No new Rash or bruise     Data Review:    CBC Recent Labs  Lab 12/08/18 2130 12/09/18 0534 12/11/18 0948 12/12/18 0632 12/13/18 0016  WBC 8.3 7.6 12.6* 7.6 3.7*  HGB 13.4 13.1 14.5 13.6 12.7  HCT 42.2 41.0 44.5 41.7 38.9  PLT 231 227 239 217 200  MCV 94.8 94.9 93.7 93.1 92.4  MCH 30.1 30.3 30.5 30.4 30.2  MCHC 31.8 32.0 32.6 32.6 32.6  RDW 15.7* 15.5 15.5 15.5 15.2  LYMPHSABS 0.5* 0.5* 1.0 1.3 0.7  MONOABS 0.7 0.1 1.0 0.9 0.5  EOSABS 0.0 0.0 0.0 0.0 0.0  BASOSABS 0.0 0.0 0.0 0.0 0.0    Chemistries  Recent Labs  Lab 12/08/18 2113 12/08/18 2346 12/09/18 0534 12/11/18 0948 12/12/18 0632 12/13/18 0016  NA 133*  --  134* 135 137 137  K 4.6   --  4.4 4.2 3.7 3.8  CL 97*  --  98 99 100 101  CO2 21*  --  22 25 24 24   GLUCOSE 213*  --  261* 196* 162* 207*  BUN 26*  --  34* 37* 39* 36*  CREATININE 1.11*  --  1.21* 0.98 1.09* 0.98  CALCIUM 8.6*  --  8.6* 8.5* 8.4* 8.3*  MG  --  2.2  --  1.9 2.0 2.0  AST 29  --  47* 56* 36 28  ALT 21  --  23 30 25 26   ALKPHOS 54  --  50 51 44 43  BILITOT 0.8  --  0.6 1.3* 1.1 0.7   ------------------------------------------------------------------------------------------------------------------ No results for input(s): CHOL, HDL, LDLCALC, TRIG, CHOLHDL, LDLDIRECT in the last 72 hours.  Lab Results  Component Value Date   HGBA1C 9.2 (H) 09/04/2018   ------------------------------------------------------------------------------------------------------------------ No results for input(s): TSH, T4TOTAL, T3FREE, THYROIDAB in the last 72 hours.  Invalid input(s): FREET3 ------------------------------------------------------------------------------------------------------------------ Recent  Labs    12/12/18 0632 12/13/18 0016  FERRITIN 380* 401*    Coagulation profile Recent Labs  Lab 12/09/18 1514 12/10/18 1139 12/11/18 0948 12/12/18 0632 12/13/18 0016  INR 2.1* 2.1* 2.3* 2.4* 2.7*    Recent Labs    12/12/18 0632 12/13/18 0016  DDIMER 0.82* 0.54*    Cardiac Enzymes No results for input(s): CKMB, TROPONINI, MYOGLOBIN in the last 168 hours.  Invalid input(s): CK ------------------------------------------------------------------------------------------------------------------    Component Value Date/Time   BNP 948.5 (H) 12/13/2018 0016    Micro Results Recent Results (from the past 240 hour(s))  SARS Coronavirus 2 by RT PCR (hospital order, performed in [facility] hospital lab) Nasopharyngeal Nasopharyngeal Swab     Status: Abnormal   Collection Time: 12/08/18  8:12 PM   Specimen: Nasopharyngeal Swab  Result Value Ref Range Status   SARS Coronavirus 2 POSITIVE (A)  NEGATIVE Final    Comment: RESULT CALLED TO, READ BACK BY AND VERIFIED WITH: BELTON,K @ 2155 ON 12/08/18 BY JUW Performed at [facility], [address], [place], [place] [address]   Urine culture     Status: Abnormal   Collection Time: 12/08/18  8:46 PM   Specimen: Urine, Catheterized  Result Value Ref Range Status   Specimen Description   Final    URINE, CATHETERIZED Performed at [facility], [address], [place], [place] [address]    Special Requests   Final    NONE Performed at [facility], [address], [place], [place] [address]    Culture (A)  Final    >=100,000 COLONIES/mL ESCHERICHIA COLI >=100,000 COLONIES/mL AEROCOCCUS URINAE Standardized susceptibility testing for this organism is not available. Performed at [facility], [address] [place], [place] [address]    Report Status 12/12/2018 FINAL  Final   Organism ID, Bacteria ESCHERICHIA COLI (A)  Final      Susceptibility   Escherichia coli - MIC*    AMPICILLIN 4 SENSITIVE Sensitive     CEFAZOLIN <=4 SENSITIVE Sensitive     CEFTRIAXONE <=1 SENSITIVE Sensitive     CIPROFLOXACIN <=0.25 SENSITIVE Sensitive     GENTAMICIN <=1 SENSITIVE Sensitive     IMIPENEM <=0.25 SENSITIVE Sensitive     NITROFURANTOIN 32 SENSITIVE Sensitive     TRIMETH/SULFA <=20 SENSITIVE Sensitive     AMPICILLIN/SULBACTAM <=2 SENSITIVE Sensitive     PIP/TAZO <=4 SENSITIVE Sensitive     Extended ESBL NEGATIVE Sensitive     * >=100,000 COLONIES/mL ESCHERICHIA COLI  Blood Culture (routine x 2)     Status: None (Preliminary result)   Collection Time: 12/08/18  9:13 PM   Specimen: BLOOD RIGHT ARM  Result Value Ref Range Status   Specimen Description BLOOD RIGHT ARM  Final   Special Requests   Final    BOTTLES DRAWN AEROBIC AND ANAEROBIC Blood Culture adequate volume   Culture   Final    NO GROWTH 4 DAYS Performed at [facility], [address], [place], [place] [address]    Report Status PENDING  Incomplete  Blood Culture  (routine x 2)     Status: None (Preliminary result)   Collection Time: 12/08/18  9:30 PM   Specimen: BLOOD RIGHT HAND  Result Value Ref Range Status   Specimen Description BLOOD RIGHT HAND  Final   Special Requests   Final    BOTTLES DRAWN AEROBIC ONLY Blood Culture adequate volume   Culture   Final    NO GROWTH 4 DAYS Performed at [facility], [address] [place], [place] [address]    Report Status PENDING  Incomplete  MRSA PCR Screening     Status: None   Collection Time: 12/10/18  6:38 AM   Specimen: Nasopharyngeal  Result Value Ref Range Status   MRSA by PCR NEGATIVE NEGATIVE Final    Comment:        The GeneXpert MRSA Assay (FDA approved for NASAL specimens only), is one component of a comprehensive MRSA colonization surveillance program. It is not intended to diagnose MRSA infection nor to guide or monitor treatment for MRSA infections. Performed at [facility], [address] [place], [place] [address]     Radiology Reports  Dg Chest Port 1 View  Result Date: 12/08/2018 CLINICAL DATA:  Shortness of breath for 2 days. EXAM: PORTABLE CHEST 1 VIEW COMPARISON:  Chest radiograph 09/03/2018 and CT 09/04/2018 FINDINGS: A dual lead pacemaker remains in place. The cardiac silhouette remains enlarged. Hazy airspace opacity is again seen in both lungs, slightly worse than on the prior study. The interstitial markings are mildly increased diffusely. No sizable pleural effusion or pneumothorax is identified. No acute osseous abnormality is seen. Aortic atherosclerosis is noted. IMPRESSION: Cardiomegaly with bilateral airspace opacities which are mildly increased compared to 09/03/2018 and may reflect edema or infection. Electronically Signed   By: Sebastian AcheAllen  Grady M.D.   On: 12/08/2018 20:56

## 2018-12-13 NOTE — Evaluation (Addendum)
Occupational Therapy Evaluation Patient Details Name: Rebecca [name] MRN: [id number] DOB: July 27, 1940 Today's Date: 12/13/2018    History of Present Illness 78 y.o. female presenting to ED with progressively worse hypoxia and weakness for the past 2 days. Pt SpO2 found to be 78% on 2L. Tested COVID + and transfered to [facility]. PMH including aortic stenosis, cataplexy, diastolic heart failure, essential hypertension, GERD/hiatal hernia, history of recurrent DVT, narcolepsy, peripheral neuropathy, unspecified chronic renal disease, type 2 diabetes, morbid obesity on home oxygen (2L at night).   Clinical Impression   PTA, pt was living with her son and was independent with BADLs and used a RW for functional mobility. Pt currently requiring Min A for UB ADLs, Mod A for LB ADLs, Mod-Max A for toileting, and Mod A for functional transfers. Pt presenting with significant fatigue and decreased activity tolerance impacting her performance of ADLs and functional transfers. SpO2 >98-86% on RA throughout session; providing cues for purse lip breathing. Pt would benefit from further acute OT to facilitate safe dc. Recommend dc to SNF for further OT to optimize safety, independence with ADLs, and return to PLOF.      Follow Up Recommendations  SNF;Supervision/Assistance - 24 hour(Mat be able to progress to home with [facility])    Equipment Recommendations  3 in 1 bedside commode;Other (comment)(Defer to next venue)    Recommendations for Other Services PT consult     Precautions / Restrictions Precautions Precautions: Fall Precaution Comments: Very weak and fatigues quickly      Mobility Bed Mobility               General bed mobility comments: In recliner upon arrival  Transfers Overall transfer level: Needs assistance Equipment used: Rolling walker (2 wheeled) Transfers: Sit to/from [employer] Sit to Stand: Mod assist(with and without RW) Stand pivot transfers: Min  assist(without RW to [facility])       General transfer comment: Pt requiring Mod A to power up into standing. Requiring Min A for balance during pivot to [facility] and back to recliner    Balance Overall balance assessment: Needs assistance Sitting-balance support: No upper extremity supported;Feet supported Sitting balance-Leahy Scale: Fair     Standing balance support: Bilateral upper extremity supported;During functional activity Standing balance-Leahy Scale: Poor                             ADL either performed or assessed with clinical judgement   ADL Overall ADL's : Needs assistance/impaired Eating/Feeding: Set up;Sitting Eating/Feeding Details (indicate cue type and reason): Increased time and effort. Pt reporting this is the first time she has had an appetite. Optimized her upright position in recliner Grooming: Wash/dry hands;Set up;Supervision/safety;Sitting   Upper Body Bathing: Minimal assistance;Sitting   Lower Body Bathing: Moderate assistance;Sit to/from stand   Upper Body Dressing : Minimal assistance;Sitting   Lower Body Dressing: Moderate assistance;Sit to/from stand Lower Body Dressing Details (indicate cue type and reason): Pt requiring Mod A for power up into standing. She is able to reach forward and adjust socks, however, fatigues quickly. Toilet Transfer: Moderate assistance;Stand-pivot;BSC Toilet Transfer Details (indicate cue type and reason): Pt requiring Mod A to power up into stanidng and then Min A for balance during pivot to [facility] for BM.  Toileting- Clothing Manipulation and Hygiene: Maximal assistance;Sit to/from stand Toileting - Clothing Manipulation Details (indicate cue type and reason): Max A for cleaning after BM     Functional mobility during ADLs:  Moderate assistance(stand pivot only) General ADL Comments: Pt very fatigued and presenting with poor activity tolerance     Vision         Perception     Praxis      Pertinent  Vitals/Pain Pain Assessment: Faces Faces Pain Scale: Hurts a little bit Pain Location: generalized Pain Descriptors / Indicators: Grimacing Pain Intervention(s): Monitored during session;Repositioned     Hand Dominance Right   Extremity/Trunk Assessment Upper Extremity Assessment Upper Extremity Assessment: Generalized weakness   Lower Extremity Assessment Lower Extremity Assessment: Defer to PT evaluation   Cervical / Trunk Assessment Cervical / Trunk Assessment: Other exceptions Cervical / Trunk Exceptions: Increased body habitus   Communication Communication Communication: No difficulties   Cognition Arousal/Alertness: Awake/alert Behavior During Therapy: WFL for tasks assessed/performed Overall Cognitive Status: Within Functional Limits for tasks assessed                                 General Comments: Pt with fatigue and requiring increased time throughout for ADLs    General Comments  SpO2 98-86% on RA throughout session. RR 14-23. Placed pt back on 1L O2 at end of session per her request    Exercises     Shoulder Instructions      Home Living Family/patient expects to be discharged to:: Private residence Living Arrangements: Children Available Help at Discharge: Available PRN/intermittently;Family Type of Home: House Home Access: Level entry     Home Layout: One level     Bathroom Shower/Tub: Occupational psychologist: Handicapped height Bathroom Accessibility: Yes   Home Equipment: Environmental consultant - 4 wheels;Shower seat   Additional Comments: 2L home O2 at night      Prior Functioning/Environment Level of Independence: Needs assistance  Gait / Transfers Assistance Needed: walked with bariatric RW reportedly independently ADL's / Homemaking Assistance Needed: Performs BADLs. 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        OT Problem List: Decreased  strength;Decreased range of motion;Decreased activity tolerance;Impaired balance (sitting and/or standing);Decreased knowledge of use of DME or AE;Decreased knowledge of precautions;Cardiopulmonary status limiting activity      OT Treatment/Interventions: Self-care/ADL training;Therapeutic exercise;Energy conservation;DME and/or AE instruction;Therapeutic activities;Patient/family education    OT Goals(Current goals can be found in the care plan section) Acute Rehab OT Goals Patient Stated Goal: "Go home" OT Goal Formulation: With patient Time For Goal Achievement: 12/27/18 Potential to Achieve Goals: Good  OT Frequency: Min 3X/week   Barriers to D/C:    Son also has COVID       Co-evaluation              AM-PAC OT "6 Clicks" Daily Activity     Outcome Measure Help from another person eating meals?: None Help from another person taking care of personal grooming?: A Little Help from another person toileting, which includes using toliet, bedpan, or urinal?: A Lot Help from another person bathing (including washing, rinsing, drying)?: A Lot Help from another person to put on and taking off regular upper body clothing?: A Little Help from another person to put on and taking off regular lower body clothing?: A Lot 6 Click Score: 16   End of Session Equipment Utilized During Treatment: Rolling walker Nurse Communication: Mobility status;Other (comment)(large BM)  Activity Tolerance: Patient limited by fatigue Patient left: in chair;with call bell/phone  within reach  OT Visit Diagnosis: Unsteadiness on feet (R26.81);Other abnormalities of gait and mobility (R26.89);Muscle weakness (generalized) (M62.81)                Time: 0175-1025 OT Time Calculation (min): 46 min Charges:  OT General Charges $OT Visit: 1 Visit OT Evaluation $OT Eval Moderate Complexity: 1 Mod OT Treatments $Self Care/Home Management : 23-37 mins  Aashi Derrington MSOT, OTR/L Acute Rehab Pager:  [phone] Office: [phone]  Theodoro Grist Jermario Kalmar 12/13/2018, 4:17 PM

## 2018-12-14 LAB — PROTIME-INR
INR: 3 — ABNORMAL HIGH (ref 0.8–1.2)
Prothrombin Time: 30.7 seconds — ABNORMAL HIGH (ref 11.4–15.2)

## 2018-12-14 LAB — CBC WITH DIFFERENTIAL/PLATELET
Abs Immature Granulocytes: 0.01 10*3/uL (ref 0.00–0.07)
Basophils Absolute: 0 10*3/uL (ref 0.0–0.1)
Basophils Relative: 0 %
Eosinophils Absolute: 0 10*3/uL (ref 0.0–0.5)
Eosinophils Relative: 0 %
HCT: 42.3 % (ref 36.0–46.0)
Hemoglobin: 13.5 g/dL (ref 12.0–15.0)
Immature Granulocytes: 0 %
Lymphocytes Relative: 22 %
Lymphs Abs: 1.2 10*3/uL (ref 0.7–4.0)
MCH: 29.4 pg (ref 26.0–34.0)
MCHC: 31.9 g/dL (ref 30.0–36.0)
MCV: 92.2 fL (ref 80.0–100.0)
Monocytes Absolute: 0.6 10*3/uL (ref 0.1–1.0)
Monocytes Relative: 11 %
Neutro Abs: 3.7 10*3/uL (ref 1.7–7.7)
Neutrophils Relative %: 67 %
Platelets: 245 10*3/uL (ref 150–400)
RBC: 4.59 MIL/uL (ref 3.87–5.11)
RDW: 15.1 % (ref 11.5–15.5)
WBC: 5.5 10*3/uL (ref 4.0–10.5)
nRBC: 0 % (ref 0.0–0.2)

## 2018-12-14 LAB — CULTURE, BLOOD (ROUTINE X 2)
Culture: NO GROWTH
Culture: NO GROWTH
Special Requests: ADEQUATE
Special Requests: ADEQUATE

## 2018-12-14 LAB — COMPREHENSIVE METABOLIC PANEL
ALT: 25 U/L (ref 0–44)
AST: 20 U/L (ref 15–41)
Albumin: 3.1 g/dL — ABNORMAL LOW (ref 3.5–5.0)
Alkaline Phosphatase: 44 U/L (ref 38–126)
Anion gap: 9 (ref 5–15)
BUN: 42 mg/dL — ABNORMAL HIGH (ref 8–23)
CO2: 27 mmol/L (ref 22–32)
Calcium: 8.5 mg/dL — ABNORMAL LOW (ref 8.9–10.3)
Chloride: 100 mmol/L (ref 98–111)
Creatinine, Ser: 0.94 mg/dL (ref 0.44–1.00)
GFR calc Af Amer: >60 mL/min (ref >60)
GFR calc non Af Amer: 58 mL/min — ABNORMAL LOW (ref >60)
Glucose, Bld: 194 mg/dL — ABNORMAL HIGH (ref 70–99)
Potassium: 4.2 mmol/L (ref 3.5–5.1)
Sodium: 136 mmol/L (ref 135–145)
Total Bilirubin: 0.7 mg/dL (ref 0.3–1.2)
Total Protein: 7 g/dL (ref 6.5–8.1)

## 2018-12-14 LAB — GLUCOSE, CAPILLARY
Glucose-Capillary: 170 mg/dL — ABNORMAL HIGH (ref 70–99)
Glucose-Capillary: 242 mg/dL — ABNORMAL HIGH (ref 70–99)
Glucose-Capillary: 399 mg/dL — ABNORMAL HIGH (ref 70–99)
Glucose-Capillary: 308 mg/dL — ABNORMAL HIGH (ref 70–99)

## 2018-12-14 LAB — MAGNESIUM: Magnesium: 1.9 mg/dL (ref 1.7–2.4)

## 2018-12-14 LAB — C-REACTIVE PROTEIN: CRP: 10.4 mg/dL — ABNORMAL HIGH (ref <1.0)

## 2018-12-14 LAB — D-DIMER, QUANTITATIVE: D-Dimer, Quant: 0.34 ug/mL-FEU (ref 0.00–0.50)

## 2018-12-14 LAB — BRAIN NATRIURETIC PEPTIDE: B Natriuretic Peptide: 876.7 pg/mL — ABNORMAL HIGH (ref 0.0–100.0)

## 2018-12-14 LAB — FERRITIN: Ferritin: 376 ng/mL — ABNORMAL HIGH (ref 11–307)

## 2018-12-14 MED ORDER — QUETIAPINE FUMARATE 25 MG PO TABS
25.0000 mg | ORAL_TABLET | Freq: Every day | ORAL | Status: DC
  Administered 2018-12-14 – 2018-12-15 (×2): 25 mg via ORAL
  Filled 2018-12-14 (×2): qty 1

## 2018-12-14 MED ORDER — CLONAZEPAM 0.5 MG PO TABS
0.5000 mg | ORAL_TABLET | Freq: Every day | ORAL | Status: DC
  Administered 2018-12-14 – 2018-12-15 (×2): 0.5 mg via ORAL
  Filled 2018-12-14 (×2): qty 1

## 2018-12-14 MED ORDER — LACTATED RINGERS IV SOLN
INTRAVENOUS | Status: AC
  Administered 2018-12-14: 100 mL/h via INTRAVENOUS

## 2018-12-14 MED ORDER — DEXAMETHASONE SODIUM PHOSPHATE 4 MG/ML IJ SOLN
2.0000 mg | INTRAMUSCULAR | Status: DC
  Administered 2018-12-14: 2 mg via INTRAVENOUS
  Filled 2018-12-14: qty 1

## 2018-12-14 MED ORDER — CLOMIPRAMINE HCL 25 MG PO CAPS
25.0000 mg | ORAL_CAPSULE | Freq: Four times a day (QID) | ORAL | Status: DC
  Administered 2018-12-14 – 2018-12-16 (×9): 25 mg via ORAL
  Filled 2018-12-14 (×14): qty 1

## 2018-12-14 MED ORDER — CLOMIPRAMINE HCL 25 MG PO CAPS: 25.0000 mg | ORAL_CAPSULE | Freq: Four times a day (QID) | ORAL | Status: DC

## 2018-12-14 MED ORDER — WARFARIN SODIUM 1 MG PO TABS
1.5000 mg | ORAL_TABLET | Freq: Once | ORAL | Status: AC
  Administered 2018-12-14: 1.5 mg via ORAL
  Filled 2018-12-14: qty 1

## 2018-12-14 MED ORDER — GABAPENTIN 100 MG PO CAPS
100.0000 mg | ORAL_CAPSULE | Freq: Two times a day (BID) | ORAL | Status: DC
  Administered 2018-12-15 – 2018-12-16 (×3): 100 mg via ORAL
  Filled 2018-12-14 (×3): qty 1

## 2018-12-14 MED ORDER — FUROSEMIDE 20 MG PO TABS: 20.0000 mg | ORAL_TABLET | Freq: Two times a day (BID) | ORAL | Status: DC

## 2018-12-14 MED ORDER — CLOMIPRAMINE HCL 25 MG PO CAPS: 25.0000 mg | ORAL_CAPSULE | Freq: Two times a day (BID) | ORAL | Status: DC

## 2018-12-14 NOTE — Progress Notes (Signed)
PROGRESS NOTE                                                                                                                                                                                                             Patient Demographics:    Rebecca [name], is a 78 y.o. female, DOB - 1940/05/27, ZOX:[id number]  Outpatient Primary MD for the patient is Oval Linseyondiego, Richard, MD   Admit date - 12/08/2018   LOS - 6  Chief Complaint  Patient presents with  . Shortness of Breath       Brief Narrative: Patient is a 78 y.o. 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   Subjective:   Patient in bed, appears comfortable, denies any headache, no fever, no chest pain or pressure, no shortness of breath , no abdominal pain. No focal weakness.   Assessment  & Plan :   Acute Hypoxic Resp Failure due to Covid 19 Viral pneumonia: She has been adequately treated with IV steroids and remdesivir initially was requiring 6 L nasal cannula oxygen currently down to 2.  Continue present line of care.  Will monitor inflammatory markers closely as they are rising.   SpO2: 100 % O2 Flow Rate (L/min): 2 L/min FiO2 (%): 80 %    COVID-19 Labs: Recent Labs    12/12/18 0632 12/13/18 0016 12/14/18 0415  DDIMER 0.82* 0.54* 0.34  FERRITIN 380* 401* 376*  CRP 18.7* 19.3* 10.4*    Lab Results  Component Value Date   SARSCOV2NAA POSITIVE (A) 12/08/2018   SARSCOV2NAA NEGATIVE 09/03/2018     COVID-19 Medications: Steroids: 11/3>> Remdesivir: 11/4>> Actemra: Not indicated-clinically improving  Convalescent Plasma: Not indicated-clinically improving Research Studies:N/A    Anxiety and depression.   Had become excessively sedated due to overuse of medications on 12/12/2018, medications were held doses reduced, mentation much improved.  Patient has been counseled on medication dose and frequency understands and accepts the plan.  Also daughter was updated on this on 12/12/2018 and again on 12/14/2018.  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  Daughter understands that patient can cause sedation, aspiration and death in the patient.  She accepts the risk.  UTI.  Noted cultures.  Currently on Levaquin will stop after 5 doses.    Elevated troponin secondary to demand ischemia: Evaluated by cardiology-continue supportive care.  Chronic diastolic heart failure: Euvolemic-Lasix  History of complete heart block-s/p permanent pacemaker placement September 2018: Continue telemetry monitoring  Persistent atrial fibrillation: Rate controlled-with Coreg-on Coumadin.  Aortic stenosis/'s mitral stenosis: Stable for outpatient follow-up  History of recurrent venous thromboembolism: INR therapeutic-Coumadin per pharmacy  Lab Results  Component Value Date   INR 3.0 (H) 12/14/2018   INR 2.7 (H) 12/13/2018   INR 2.4 (H) 12/12/2018    DM-2: CBGs relatively stable-continue Lantus 20 units and SSI  CBG (last 3)  Recent Labs    12/13/18 1616 12/13/18 2151 12/14/18 0724  GLUCAP 324* 321* 170*   Narcolepsy/cataplexy/insomnia: Continue clomipramine, mirtazapine, and home dosage of Klonopin at bedtime  Depression: Stable-continue with Cymbalta and Seroquel  Obesity: BMI, follow with PCP for weight loss.    Condition - Stable  Family Communication  : daughter 11/7 - understands holding sedatives, warns she gets mean and has a narcissistic personality.  Daughter states that she understands that patient sedatives have to be held and she agrees with the plan.   Daughter updated again on 12/14/2018 at 10:45 AM.  Code Status :  Full Code  Diet :  Diet Order            DIET SOFT Room service appropriate? Yes; Fluid consistency: Thin  Diet effective now               Disposition Plan  :  Remain hospitalized - HHPT  Barriers to discharge: Hypoxia requiring O2 supplementation/complete 5 days of IV Remdesivir, 12/15/18  Consults  :  None  Procedures  :  None   Antibiotics  :    Anti-infectives (From admission, onward)   Start     Dose/Rate Route Frequency Ordered Stop   12/13/18 1400  levofloxacin (LEVAQUIN) tablet 500 mg     500 mg Oral Every 24 hours 12/13/18 0823 12/15/18 1359   12/10/18 1600  remdesivir 100 mg in sodium chloride 0.9 % 250 mL IVPB     100 mg 500 mL/hr over 30 Minutes Intravenous Every 24 hours 12/09/18 1431 12/13/18 1619   12/10/18 1400  levofloxacin (LEVAQUIN) IVPB 500 mg  Status:  Discontinued     500 mg 100 mL/hr over 60 Minutes Intravenous Every 24 hours 12/10/18 1308 12/13/18 0823   12/09/18 1600  remdesivir 200 mg in sodium chloride 0.9 % 250 mL IVPB     200 mg 500 mL/hr over 30 Minutes Intravenous Once 12/09/18 1431 12/09/18 1658      DVT Prophylaxis  : Coumadin  Inpatient Medications  Scheduled Meds: . aspirin EC  81 mg Oral Daily  . carvedilol  3.125 mg Oral BID WC  . clomiPRAMINE  25 mg Oral QID  . clonazePAM  0.5 mg Oral QHS  . dexamethasone (DECADRON) injection  2 mg Intravenous Q24H  . DULoxetine  60 mg Oral Daily  . [START ON 12/15/2018] furosemide  20 mg Oral BID  . [START ON 12/15/2018] gabapentin  100 mg Oral BID  . insulin aspart  0-20 Units Subcutaneous TID WC  . insulin aspart  0-5 Units Subcutaneous QHS  . insulin glargine  20 Units Subcutaneous Daily  . Ipratropium-Albuterol  2 puff Inhalation Q6H  .  levofloxacin  500 mg Oral Q24H  . pneumococcal 23 valent vaccine  0.5 mL Intramuscular Tomorrow-1000  . QUEtiapine  25 mg Oral QHS  . vitamin C  500 mg Oral Daily  . Warfarin -  Pharmacist Dosing Inpatient   Does not apply q1800  . zinc sulfate  220 mg Oral Daily   Continuous Infusions: . lactated ringers 100 mL/hr (12/14/18 0946)   PRN Meds:.acetaminophen **OR** acetaminophen, albuterol, guaiFENesin-dextromethorphan, [DISCONTINUED] ondansetron **OR** ondansetron (ZOFRAN) IV   Time Spent in minutes  25  See all Orders from today for further details   Susa RaringPrashant Olvin Rohr M.D on 12/14/2018 at 10:45 AM  To page go to [email or web address] - use universal password  [facility] -  Office  [phone]    Objective:   Vitals:   12/13/18 2335 12/14/18 0000 12/14/18 0400 12/14/18 0726  BP:  (!) 155/91 (!) 146/74 125/70  Pulse:  61 (!) 59 (!) 58  Resp:  20 (!) 23 (!) 22  Temp: (!) 97.4 F (36.3 C)  (!) 97.2 F (36.2 C) 97.6 F (36.4 C)  TempSrc: Axillary  Axillary Oral  SpO2:  94% 94% 100%  Weight:      Height:        Wt Readings from Last 3 Encounters:  12/08/18 108.3 kg  09/07/18 108.3 kg  03/02/18 99.8 kg     Intake/Output Summary (Last 24 hours) at 12/14/2018 1045 Last data filed at 12/14/2018 0800 Gross per 24 hour  Intake 310 ml  Output 1700 ml  Net -1390 ml     Physical Exam  Somnolent, No new F.N deficits, Normal affect [place].AT,PERRAL Supple Neck,No JVD, No cervical lymphadenopathy appriciated.  Symmetrical Chest wall movement, Good air movement bilaterally, CTAB RRR,No Gallops, Rubs or new Murmurs, No Parasternal Heave +ve B.Sounds, Abd Soft, No tenderness, No organomegaly appriciated, No rebound - guarding or rigidity. No Cyanosis, Clubbing or edema, No new Rash or bruise    Data Review:    CBC Recent Labs  Lab 12/09/18 0534 12/11/18 0948 12/12/18 0632 12/13/18 0016 12/14/18 0415  WBC 7.6 12.6* 7.6 3.7* 5.5  HGB 13.1 14.5 13.6 12.7 13.5  HCT 41.0 44.5 41.7 38.9 42.3  PLT 227 239 217 200 245  MCV 94.9 93.7 93.1 92.4 92.2  MCH 30.3 30.5 30.4 30.2 29.4  MCHC 32.0 32.6 32.6 32.6 31.9  RDW 15.5 15.5 15.5 15.2 15.1   LYMPHSABS 0.5* 1.0 1.3 0.7 1.2  MONOABS 0.1 1.0 0.9 0.5 0.6  EOSABS 0.0 0.0 0.0 0.0 0.0  BASOSABS 0.0 0.0 0.0 0.0 0.0    Chemistries  Recent Labs  Lab 12/08/18 2346 12/09/18 0534 12/11/18 0948 12/12/18 0632 12/13/18 0016 12/14/18 0415  NA  --  134* 135 137 137 136  K  --  4.4 4.2 3.7 3.8 4.2  CL  --  98 99 100 101 100  CO2  --  22 25 24 24 27   GLUCOSE  --  261* 196* 162* 207* 194*  BUN  --  34* 37* 39* 36* 42*  CREATININE  --  1.21* 0.98 1.09* 0.98 0.94  CALCIUM  --  8.6* 8.5* 8.4* 8.3* 8.5*  MG 2.2  --  1.9 2.0 2.0 1.9  AST  --  47* 56* 36 28 20  ALT  --  23 30 25 26 25   ALKPHOS  --  50 51 44 43 44  BILITOT  --  0.6 1.3* 1.1 0.7 0.7   ------------------------------------------------------------------------------------------------------------------ No results for input(s): CHOL, HDL, LDLCALC, TRIG,  CHOLHDL, LDLDIRECT in the last 72 hours.  Lab Results  Component Value Date   HGBA1C 9.2 (H) 09/04/2018   ------------------------------------------------------------------------------------------------------------------ No results for input(s): TSH, T4TOTAL, T3FREE, THYROIDAB in the last 72 hours.  Invalid input(s): FREET3 ------------------------------------------------------------------------------------------------------------------ Recent Labs    12/13/18 0016 12/14/18 0415  FERRITIN 401* 376*    Coagulation profile Recent Labs  Lab 12/10/18 1139 12/11/18 0948 12/12/18 0632 12/13/18 0016 12/14/18 0415  INR 2.1* 2.3* 2.4* 2.7* 3.0*    Recent Labs    12/13/18 0016 12/14/18 0415  DDIMER 0.54* 0.34    Cardiac Enzymes No results for input(s): CKMB, TROPONINI, MYOGLOBIN in the last 168 hours.  Invalid input(s): CK ------------------------------------------------------------------------------------------------------------------    Component Value Date/Time   BNP 876.7 (H) 12/14/2018 0415    Micro Results Recent Results (from the past 240 hour(s))   SARS Coronavirus 2 by RT PCR (hospital order, performed in [facility] hospital lab) Nasopharyngeal Nasopharyngeal Swab     Status: Abnormal   Collection Time: 12/08/18  8:12 PM   Specimen: Nasopharyngeal Swab  Result Value Ref Range Status   SARS Coronavirus 2 POSITIVE (A) NEGATIVE Final    Comment: RESULT CALLED TO, READ BACK BY AND VERIFIED WITH: BELTON,K @ 2155 ON 12/08/18 BY JUW Performed at [facility], [address], [place], [place] [address]   Urine culture     Status: Abnormal   Collection Time: 12/08/18  8:46 PM   Specimen: Urine, Catheterized  Result Value Ref Range Status   Specimen Description   Final    URINE, CATHETERIZED Performed at [facility], [address], [place], [place] [address]    Special Requests   Final    NONE Performed at [facility], [address], [place], [place] [address]    Culture (A)  Final    >=100,000 COLONIES/mL ESCHERICHIA COLI >=100,000 COLONIES/mL AEROCOCCUS URINAE Standardized susceptibility testing for this organism is not available. Performed at [facility], [address], [place], [place] [address]    Report Status 12/12/2018 FINAL  Final   Organism ID, Bacteria ESCHERICHIA COLI (A)  Final      Susceptibility   Escherichia coli - MIC*    AMPICILLIN 4 SENSITIVE Sensitive     CEFAZOLIN <=4 SENSITIVE Sensitive     CEFTRIAXONE <=1 SENSITIVE Sensitive     CIPROFLOXACIN <=0.25 SENSITIVE Sensitive     GENTAMICIN <=1 SENSITIVE Sensitive     IMIPENEM <=0.25 SENSITIVE Sensitive     NITROFURANTOIN 32 SENSITIVE Sensitive     TRIMETH/SULFA <=20 SENSITIVE Sensitive     AMPICILLIN/SULBACTAM <=2 SENSITIVE Sensitive     PIP/TAZO <=4 SENSITIVE Sensitive     Extended ESBL NEGATIVE Sensitive     * >=100,000 COLONIES/mL ESCHERICHIA COLI  Blood Culture (routine x 2)     Status: None   Collection Time: 12/08/18  9:13 PM   Specimen: BLOOD RIGHT ARM  Result Value Ref Range Status   Specimen Description BLOOD RIGHT ARM  Final   Special  Requests   Final    BOTTLES DRAWN AEROBIC AND ANAEROBIC Blood Culture adequate volume   Culture   Final    NO GROWTH 6 DAYS Performed at [facility], [address], [place], [place] [address]    Report Status 12/14/2018 FINAL  Final  Blood Culture (routine x 2)     Status: None   Collection Time: 12/08/18  9:30 PM   Specimen: BLOOD RIGHT HAND  Result Value Ref Range Status   Specimen Description BLOOD  RIGHT HAND  Final   Special Requests   Final    BOTTLES DRAWN AEROBIC ONLY Blood Culture adequate volume   Culture   Final    NO GROWTH 6 DAYS Performed at [facility], [address], [place], [place] [address]    Report Status 12/14/2018 FINAL  Final  MRSA PCR Screening     Status: None   Collection Time: 12/10/18  6:38 AM   Specimen: Nasopharyngeal  Result Value Ref Range Status   MRSA by PCR NEGATIVE NEGATIVE Final    Comment:        The GeneXpert MRSA Assay (FDA approved for NASAL specimens only), is one component of a comprehensive MRSA colonization surveillance program. It is not intended to diagnose MRSA infection nor to guide or monitor treatment for MRSA infections. Performed at [facility], [address], [place], [place] [address]     Radiology Reports  Dg Chest Port 1 View  Result Date: 12/08/2018 CLINICAL DATA:  Shortness of breath for 2 days. EXAM: PORTABLE CHEST 1 VIEW COMPARISON:  Chest radiograph 09/03/2018 and CT 09/04/2018 FINDINGS: A dual lead pacemaker remains in place. The cardiac silhouette remains enlarged. Hazy airspace opacity is again seen in both lungs, slightly worse than on the prior study. The interstitial markings are mildly increased diffusely. No sizable pleural effusion or pneumothorax is identified. No acute osseous abnormality is seen. Aortic atherosclerosis is noted. IMPRESSION: Cardiomegaly with bilateral airspace opacities which are mildly increased compared to 09/03/2018 and may reflect edema or infection. Electronically Signed    By: Sebastian Ache M.D.   On: 12/08/2018 20:56

## 2018-12-14 NOTE — Progress Notes (Signed)
Spoke with patients daughter and given update on her mother. She requests update from Dr Candiss Norse. Will notify MD.

## 2018-12-14 NOTE — Progress Notes (Signed)
Inpatient Diabetes Program Recommendations  AACE/ADA: New Consensus Statement on Inpatient Glycemic Control   Target Ranges:  Prepandial:   less than 140 mg/dL      Peak postprandial:   less than 180 mg/dL (1-2 hours)      Critically ill patients:  140 - 180 mg/dL   Results for SHANTAYA, BLUESTONE (MRN [id number]) as of 12/14/2018 11:53  Ref. Range 12/13/2018 07:25 12/13/2018 12:10 12/13/2018 16:16 12/13/2018 21:51 12/14/2018 07:24  Glucose-Capillary Latest Ref Range: 70 - 99 mg/dL 162 (H) 210 (H) 324 (H) 321 (H) 170 (H)   Review of Glycemic Control  Diabetes history: DM2 Outpatient Diabetes medications: Lantus 24 units daily, Metformin 500 mg BID, Bydureon 2 mg Qweek Current orders for Inpatient glycemic control: Lantus 20 units daily, Novolog 0-20 units TID with meals, Novolog 0-5 units QHS; Decadron 2 mg Q24H  Inpatient Diabetes Program Recommendations:   Insulin-Meal Coverage: If steroids are continued, please consider ordering Novolog 5 units TID with meals for meal coverage if patient eats at least 50% of meals.  Thanks, Barnie Alderman, RN, MSN, CDE Diabetes Coordinator Inpatient Diabetes Program [phone] (Team Pager from 8am to 5pm)

## 2018-12-14 NOTE — Progress Notes (Signed)
Rebecca Smiths for warfarin Indication: atrial fibrillation  Allergies  Allergen Reactions  . Codeine Anxiety  . Compazine Anaphylaxis  . Other Anaphylaxis, Rash and Other (See Comments)    Uncoded Allergy. Allergen: INOVAR Uncoded Allergy. Allergen: ALL ADHESIVES Uncoded Allergy. Allergen: SILK SUTURE Uncoded Allergy. Allergen: merthiolate Thermasol  . Penicillins Shortness Of Breath and Rash    Has patient had a PCN reaction causing immediate rash, facial/tongue/throat swelling, SOB or lightheadedness with hypotension: Yes Has patient had a PCN reaction causing severe rash involving mucus membranes or skin necrosis: No Has patient had a PCN reaction that required hospitalization No Has patient had a PCN reaction occurring within the last 10 years: Yes If all of the above answers are "NO", then may proceed with Cephalosporin use.   Marland Kitchen Morphine And Related Other (See Comments)    Patient states that it makes her lose her state of mind.   . Demerol [Meperidine] Rash    Patient Measurements: Height: 5\' 8"  (172.7 cm) Weight: 238 lb 12.1 oz (108.3 kg) IBW/kg (Calculated) : 63.9 Heparin Dosing Weight: 88 kg  Vital Signs: Temp: 97.6 F (36.4 C) (11/09 1121) Temp Source: Axillary (11/09 1121) BP: 126/71 (11/09 1121) Pulse Rate: 58 (11/09 1121)  Labs: Recent Labs    12/12/18 0632 12/13/18 0016 12/14/18 0415  HGB 13.6 12.7 13.5  HCT 41.7 38.9 42.3  PLT 217 200 245  LABPROT 26.0* 28.1* 30.7*  INR 2.4* 2.7* 3.0*  CREATININE 1.09* 0.98 0.94    Estimated Creatinine Clearance: 62.6 mL/min (by C-G formula based on SCr of 0.94 mg/dL).  Assessment: 47 YOF on warfarin PTA for Afib/recurrent DVT. Was briefly transitioned to IV heparin for ACS. Now resumed on warfarin per cardiology.  Home warfarin dose: Alternating 2.5 mg and 5 mg every other day  - INR remains therapeutic at 3 today - trending up. H/H and Plt wnl - major DDI with Levaquin  started 11/5, ends 11/9  Goal of Therapy:  INR 2-3 Monitor platelets by anticoagulation protocol: Yes   Plan:  Warfarin 1.5 mg PO x1 today Likely can go back on home dose once Levaquin off Daily INR  Rebecca [name], PharmD., BCPS Clinical Pharmacist Clinical phone for 12/14/18 until 5pm: [phone]

## 2018-12-14 NOTE — Evaluation (Addendum)
Physical Therapy Evaluation Patient Details Name: Rebecca [name] MRN: [id number] DOB: November 21, 1940 Today's Date: 12/14/2018   History of Present Illness  78 y.o. female presenting to ED with progressively worse hypoxia and weakness for the past 2 days. Pt SpO2 found to be 78% on 2L. Tested COVID + and transfered to [facility]. PMH including aortic stenosis, cataplexy, diastolic heart failure, essential hypertension, GERD/hiatal hernia, history of recurrent DVT, narcolepsy, peripheral neuropathy, unspecified chronic renal disease, type 2 diabetes, morbid obesity on home oxygen (2L at night).  Clinical Impression  The patient requires increased encouragement to mobilize. Patient was assisted to sitting and to recliner using RW, trunk flexed.signThe patient remained on 2 L with SPO2 >92%. Patient should progress to return home with family assisting if patient continue to mobilize daily. Pt admitted with above diagnosis. . Pt currently with functional limitations due to the deficits listed below (see PT Problem List). Pt will benefit from skilled PT to increase their independence and safety with mobility to allow discharge to the venue listed below.       Follow Up Recommendations SNF vs Home health PT;Supervision/Assistance - 24 hour depending on progress    Equipment Recommendations  None recommended by PT    Recommendations for Other Services       Precautions / Restrictions Precautions Precautions: Fall Precaution Comments: Very weak and fatigues quickly      Mobility  Bed Mobility Overal bed mobility: Needs Assistance Bed Mobility: Sidelying to Sit;Rolling Rolling: Min assist Sidelying to sit: Mod assist       General bed mobility comments: assist to raise trunk and place legs over bed edge  Transfers Overall transfer level: Needs assistance Equipment used: Rolling walker (2 wheeled) Transfers: Sit to/from [employer] Sit to Stand: Mod assist Stand pivot transfers:  Mod assist       General transfer comment: Pt requiring Mod A to power up into standing. Requiring Mod A for balance, cues to stand more erect  at RW during pivot to recliner  Ambulation/Gait                Stairs            Wheelchair Mobility    Modified Rankin (Stroke Patients Only)       Balance Overall balance assessment: Needs assistance Sitting-balance support: No upper extremity supported;Feet supported Sitting balance-Leahy Scale: Fair     Standing balance support: Bilateral upper extremity supported;During functional activity Standing balance-Leahy Scale: Poor Standing balance comment: reliant on RW,                             Pertinent Vitals/Pain Faces Pain Scale: Hurts a little bit Pain Location: generalized Pain Descriptors / Indicators: Grimacing Pain Intervention(s): Monitored during session    Home Living Family/patient expects to be discharged to:: Private residence Living Arrangements: Children Available Help at Discharge: Available PRN/intermittently;Family Type of Home: House Home Access: Level entry     Home Layout: One level Home Equipment: Walker - 4 wheels;Shower seat Additional Comments: 2L home O2 at night    Prior Function Level of Independence: Needs assistance   Gait / Transfers Assistance Needed: walked with bariatric RW reportedly independently  ADL's / Homemaking Assistance Needed: does get some assistance        Hand Dominance        Extremity/Trunk Assessment   Upper Extremity Assessment Upper Extremity Assessment: Generalized weakness    Lower Extremity Assessment  Lower Extremity Assessment: Generalized weakness    Cervical / Trunk Assessment Cervical / Trunk Exceptions: Increased body habitus  Communication      Cognition   Behavior During Therapy: WFL for tasks assessed/performed Overall Cognitive Status: Within Functional Limits for tasks assessed                                  General Comments: Pt with fatigue and requiring increased time throughout for ADLs  and much encouragement      General Comments      Exercises     Assessment/Plan    PT Assessment Patient needs continued PT services  PT Problem List Decreased strength;Decreased mobility;Decreased safety awareness;Decreased knowledge of precautions;Obesity;Decreased activity tolerance;Decreased balance       PT Treatment Interventions DME instruction;Therapeutic activities;Gait training;Therapeutic exercise;Patient/family education;Functional mobility training;Balance training    PT Goals (Current goals can be found in the Care Plan section)  Acute Rehab PT Goals Patient Stated Goal: "Go home" PT Goal Formulation: With patient Time For Goal Achievement: 12/28/18 Potential to Achieve Goals: Fair    Frequency Min 3X/week   Barriers to discharge        Co-evaluation               AM-PAC PT "6 Clicks" Mobility  Outcome Measure Help needed turning from your back to your side while in a flat bed without using bedrails?: A Lot Help needed moving from lying on your back to sitting on the side of a flat bed without using bedrails?: A Lot Help needed moving to and from a bed to a chair (including a wheelchair)?: A Lot Help needed standing up from a chair using your arms (e.g., wheelchair or bedside chair)?: A Lot Help needed to walk in hospital room?: Total Help needed climbing 3-5 steps with a railing? : Total 6 Click Score: 10    End of Session Equipment Utilized During Treatment: Oxygen Activity Tolerance: Patient limited by fatigue Patient left: in chair;with call bell/phone within reach;with chair alarm set;with nursing/sitter in room Nurse Communication: Mobility status PT Visit Diagnosis: Muscle weakness (generalized) (M62.81);Difficulty in walking, not elsewhere classified (R26.2)    Time: 1517-6160 PT Time Calculation (min) (ACUTE ONLY): 24 min   Charges:    PT Evaluation $PT Eval Moderate Complexity: 1 Mod PT Treatments $Therapeutic Activity: 8-22 mins        Rebecca [name] PT Acute Rehabilitation Services Pager [phone] Office [phone]  Rebecca [name] 12/14/2018, 2:38 PM

## 2018-12-15 LAB — CBC WITH DIFFERENTIAL/PLATELET
Abs Immature Granulocytes: 0.04 10*3/uL (ref 0.00–0.07)
Basophils Absolute: 0 10*3/uL (ref 0.0–0.1)
Basophils Relative: 0 %
Eosinophils Absolute: 0.3 10*3/uL (ref 0.0–0.5)
Eosinophils Relative: 3 %
HCT: 42.4 % (ref 36.0–46.0)
Hemoglobin: 14 g/dL (ref 12.0–15.0)
Immature Granulocytes: 0 %
Lymphocytes Relative: 21 %
Lymphs Abs: 2.3 10*3/uL (ref 0.7–4.0)
MCH: 30.2 pg (ref 26.0–34.0)
MCHC: 33 g/dL (ref 30.0–36.0)
MCV: 91.4 fL (ref 80.0–100.0)
Monocytes Absolute: 1.1 10*3/uL — ABNORMAL HIGH (ref 0.1–1.0)
Monocytes Relative: 10 %
Neutro Abs: 7.5 10*3/uL (ref 1.7–7.7)
Neutrophils Relative %: 66 %
Platelets: 318 10*3/uL (ref 150–400)
RBC: 4.64 MIL/uL (ref 3.87–5.11)
RDW: 15.1 % (ref 11.5–15.5)
WBC: 11.3 10*3/uL — ABNORMAL HIGH (ref 4.0–10.5)
nRBC: 0 % (ref 0.0–0.2)

## 2018-12-15 LAB — COMPREHENSIVE METABOLIC PANEL
ALT: 23 U/L (ref 0–44)
AST: 18 U/L (ref 15–41)
Albumin: 2.9 g/dL — ABNORMAL LOW (ref 3.5–5.0)
Alkaline Phosphatase: 43 U/L (ref 38–126)
Anion gap: 10 (ref 5–15)
BUN: 36 mg/dL — ABNORMAL HIGH (ref 8–23)
CO2: 26 mmol/L (ref 22–32)
Calcium: 8.4 mg/dL — ABNORMAL LOW (ref 8.9–10.3)
Chloride: 103 mmol/L (ref 98–111)
Creatinine, Ser: 0.85 mg/dL (ref 0.44–1.00)
GFR calc Af Amer: >60 mL/min (ref >60)
GFR calc non Af Amer: >60 mL/min (ref >60)
Glucose, Bld: 63 mg/dL — ABNORMAL LOW (ref 70–99)
Potassium: 3.5 mmol/L (ref 3.5–5.1)
Sodium: 139 mmol/L (ref 135–145)
Total Bilirubin: 0.9 mg/dL (ref 0.3–1.2)
Total Protein: 6.4 g/dL — ABNORMAL LOW (ref 6.5–8.1)

## 2018-12-15 LAB — GLUCOSE, CAPILLARY
Glucose-Capillary: 59 mg/dL — ABNORMAL LOW (ref 70–99)
Glucose-Capillary: 101 mg/dL — ABNORMAL HIGH (ref 70–99)
Glucose-Capillary: 147 mg/dL — ABNORMAL HIGH (ref 70–99)
Glucose-Capillary: 198 mg/dL — ABNORMAL HIGH (ref 70–99)
Glucose-Capillary: 366 mg/dL — ABNORMAL HIGH (ref 70–99)

## 2018-12-15 LAB — C-REACTIVE PROTEIN: CRP: 4.9 mg/dL — ABNORMAL HIGH (ref <1.0)

## 2018-12-15 LAB — BRAIN NATRIURETIC PEPTIDE: B Natriuretic Peptide: 685 pg/mL — ABNORMAL HIGH (ref 0.0–100.0)

## 2018-12-15 LAB — FERRITIN: Ferritin: 233 ng/mL (ref 11–307)

## 2018-12-15 LAB — PROTIME-INR
INR: 2.8 — ABNORMAL HIGH (ref 0.8–1.2)
Prothrombin Time: 28.8 seconds — ABNORMAL HIGH (ref 11.4–15.2)

## 2018-12-15 LAB — MAGNESIUM: Magnesium: 1.8 mg/dL (ref 1.7–2.4)

## 2018-12-15 LAB — D-DIMER, QUANTITATIVE: D-Dimer, Quant: 0.34 ug/mL-FEU (ref 0.00–0.50)

## 2018-12-15 MED ORDER — DEXAMETHASONE SODIUM PHOSPHATE 4 MG/ML IJ SOLN
1.0000 mg | INTRAMUSCULAR | Status: AC
  Administered 2018-12-15: 1 mg via INTRAVENOUS
  Filled 2018-12-15: qty 1

## 2018-12-15 MED ORDER — POTASSIUM CHLORIDE CRYS ER 20 MEQ PO TBCR
40.0000 meq | EXTENDED_RELEASE_TABLET | Freq: Once | ORAL | Status: AC
  Administered 2018-12-15: 40 meq via ORAL
  Filled 2018-12-15: qty 2

## 2018-12-15 MED ORDER — CLONAZEPAM 1 MG PO TABS: 1.0000 mg | ORAL_TABLET | Freq: Every day | ORAL | Status: AC

## 2018-12-15 MED ORDER — WARFARIN SODIUM 2.5 MG PO TABS
2.5000 mg | ORAL_TABLET | Freq: Once | ORAL | Status: AC
  Administered 2018-12-15: 2.5 mg via ORAL
  Filled 2018-12-15: qty 1

## 2018-12-15 MED ORDER — MIRTAZAPINE 7.5 MG PO TABS: 7.5000 mg | ORAL_TABLET | Freq: Every day | ORAL | 0 refills | Status: DC

## 2018-12-15 MED ORDER — AMLODIPINE BESYLATE 5 MG PO TABS
5.0000 mg | ORAL_TABLET | Freq: Every day | ORAL | Status: DC
  Administered 2018-12-15 – 2018-12-16 (×2): 5 mg via ORAL
  Filled 2018-12-15 (×2): qty 1

## 2018-12-15 MED ORDER — FUROSEMIDE 20 MG PO TABS
20.0000 mg | ORAL_TABLET | Freq: Two times a day (BID) | ORAL | Status: DC
  Administered 2018-12-16: 20 mg via ORAL
  Filled 2018-12-15: qty 1

## 2018-12-15 MED ORDER — INSULIN ASPART 100 UNIT/ML ~~LOC~~ SOLN
0.0000 [IU] | Freq: Every day | SUBCUTANEOUS | Status: DC
  Administered 2018-12-15: 5 [IU] via SUBCUTANEOUS

## 2018-12-15 MED ORDER — INSULIN ASPART 100 UNIT/ML ~~LOC~~ SOLN
0.0000 [IU] | Freq: Three times a day (TID) | SUBCUTANEOUS | Status: DC
  Administered 2018-12-15: 1 [IU] via SUBCUTANEOUS
  Administered 2018-12-15 – 2018-12-16 (×2): 2 [IU] via SUBCUTANEOUS

## 2018-12-15 MED ORDER — INSULIN GLARGINE 100 UNIT/ML ~~LOC~~ SOLN
10.0000 [IU] | Freq: Every day | SUBCUTANEOUS | Status: DC
  Administered 2018-12-15: 10 [IU] via SUBCUTANEOUS
  Filled 2018-12-15: qty 0.1

## 2018-12-15 MED ORDER — SODIUM CHLORIDE 0.9% FLUSH: 10.0000 mL | INTRAVENOUS | Status: DC | PRN

## 2018-12-15 MED ORDER — FUROSEMIDE 10 MG/ML IJ SOLN
60.0000 mg | Freq: Once | INTRAMUSCULAR | Status: AC
  Administered 2018-12-15: 60 mg via INTRAVENOUS
  Filled 2018-12-15: qty 6

## 2018-12-15 MED ORDER — POTASSIUM CHLORIDE CRYS ER 20 MEQ PO TBCR
20.0000 meq | EXTENDED_RELEASE_TABLET | Freq: Once | ORAL | Status: AC
  Administered 2018-12-15: 20 meq via ORAL
  Filled 2018-12-15: qty 1

## 2018-12-15 NOTE — Progress Notes (Signed)
Inpatient Diabetes Program Recommendations  AACE/ADA: New Consensus Statement on Inpatient Glycemic Control   Target Ranges:  Prepandial:   less than 140 mg/dL      Peak postprandial:   less than 180 mg/dL (1-2 hours)      Critically ill patients:  140 - 180 mg/dL   Results for SABELLA, TRAORE (MRN [id number]) as of 12/15/2018 08:50  Ref. Range 12/14/2018 07:24 12/14/2018 11:18 12/14/2018 15:48 12/14/2018 19:51 12/15/2018 07:30  Glucose-Capillary Latest Ref Range: 70 - 99 mg/dL 170 (H)  Novolog 4 units  Lantus 20 units  242 (H)  Novolog 7 units 399 (H)  Novolog 20 units   308 (H) 59 (L)    Review of Glycemic Control  Diabetes history: DM2 Outpatient Diabetes medications: Lantus 24 units daily, Metformin 500 mg BID, Bydureon 2 mg Qweek Current orders for Inpatient glycemic control: Lantus 10 units daily, Novolog 0-9 units TID with meals, Novolog 0-5 units QHS; Decadron 1 mg Q24H  Inpatient Diabetes Program Recommendations:   Insulin-Basal: Noted Lantus was decreased from 20 to 10 units daily today.   Insulin-Meal Coverage:Noted Decadron decreased from 2 mg to 1 mg. Post prandial glucose consistently elevated.  If steroids are continued, please consider ordering Novolog 4 units TID with meals for meal coverage if patient eats at least 50% of meals.  Thanks, Barnie Alderman, RN, MSN, CDE Diabetes Coordinator Inpatient Diabetes Program [phone] (Team Pager from 8am to 5pm)

## 2018-12-15 NOTE — Progress Notes (Signed)
[place] for warfarin Indication: atrial fibrillation  Allergies  Allergen Reactions  . Codeine Anxiety  . Compazine Anaphylaxis  . Other Anaphylaxis, Rash and Other (See Comments)    Uncoded Allergy. Allergen: INOVAR Uncoded Allergy. Allergen: ALL ADHESIVES Uncoded Allergy. Allergen: SILK SUTURE Uncoded Allergy. Allergen: merthiolate Thermasol  . Penicillins Shortness Of Breath and Rash    Has patient had a PCN reaction causing immediate rash, facial/tongue/throat swelling, SOB or lightheadedness with hypotension: Yes Has patient had a PCN reaction causing severe rash involving mucus membranes or skin necrosis: No Has patient had a PCN reaction that required hospitalization No Has patient had a PCN reaction occurring within the last 10 years: Yes If all of the above answers are "NO", then may proceed with Cephalosporin use.   Marland Kitchen Morphine And Related Other (See Comments)    Patient states that it makes her lose her state of mind.   . Demerol [Meperidine] Rash    Patient Measurements: Height: 5\' 8"  (172.7 cm) Weight: 238 lb 12.1 oz (108.3 kg) IBW/kg (Calculated) : 63.9 Heparin Dosing Weight: 88 kg  Vital Signs: Temp: 97.7 F (36.5 C) (11/10 1642) Temp Source: Oral (11/10 1642) BP: 147/57 (11/10 1642) Pulse Rate: 60 (11/10 1702)  Labs: Recent Labs    12/13/18 0016 12/14/18 0415 12/15/18 0446  HGB 12.7 13.5 14.0  HCT 38.9 42.3 42.4  PLT 200 245 318  LABPROT 28.1* 30.7* 28.8*  INR 2.7* 3.0* 2.8*  CREATININE 0.98 0.94 0.85    Estimated Creatinine Clearance: 69.2 mL/min (by C-G formula based on SCr of 0.85 mg/dL).  Assessment: 77 YOF on warfarin PTA for Afib/recurrent DVT. Was briefly transitioned to IV heparin for ACS. Now resumed on warfarin per cardiology.  Home warfarin dose: Alternating 2.5 mg and 5 mg every other day   INR came back today at 2.8. We will try her home dose tonight.   Goal of Therapy:  INR 2-3 Monitor  platelets by anticoagulation protocol: Yes   Plan:  Warfarin 2.5 mg PO x1 today Likely can go back on home dose once Levaquin off Daily INR  Onnie Boer, PharmD, BCIDP, AAHIVP, CPP Infectious Disease Pharmacist 12/15/2018 6:13 PM

## 2018-12-15 NOTE — Progress Notes (Signed)
Pt daughter updated today about plan to dc but after discussing w./ family the pt son is positive & unable to care for her & daughter is high risk. Pt is unable to care for herself at home, discussed this w/ Education officer, museum.  Linen change peri-care, bath today 11/10  Low BG of 59 this am, back WNL after OJ & apple juice. Pt was asymptomatic.  Pt on RA

## 2018-12-15 NOTE — Care Management Important Message (Signed)
Important Message  Patient Details  Name: Rebecca [name] MRN: [id number] Date of Birth: April 30, 1940   Medicare Important Message Given:  Yes - Important Message mailed due to current National Emergency   Verbal consent obtained due to current National Emergency  Relationship to patient: Child Contact Name: Eulis Foster Call Date: 12/15/18  Time: 1404 Phone: [phone] Outcome: Spoke with contact Important Message mailed to: Other (must enter comment)(emailed to [email or web address])   Gualberto Wahlen P Aalijah Lanphere 12/15/2018, 2:05 PM

## 2018-12-15 NOTE — NC FL2 (Signed)
[place] MEDICAID FL2 LEVEL OF CARE SCREENING TOOL     IDENTIFICATION  Patient Name: Rebecca [name] Birthdate: May 19, 1940 Sex: female Admission Date (Current Location): 12/08/2018  [facility] and [place] Number:  Herbalist and Address:  [facility], [place] [address] [place], [place] [address]([address]      Provider Number:    Attending Physician Name and Address:  Thurnell Lose, MD  Relative Name and Phone Number:  daughter :  Eulis Foster  [phone]    Current Level of Care: Hospital Recommended Level of Care: [place] Prior Approval Number:    Date Approved/Denied:   PASRR Number: [id number]  Discharge Plan: SNF    Current Diagnoses: Patient Active Problem List   Diagnosis Date Noted  . Paroxysmal atrial fibrillation ([place]) 12/09/2018  . Acute on chronic respiratory failure ([place]) 12/09/2018  . Pneumonia due to COVID-19 virus 12/08/2018  . History of recurrent deep vein thrombosis (DVT)   . Type 2 diabetes mellitus ([place])   . Lobar pneumonia ([place]) 09/05/2018  . Acute on chronic diastolic CHF (congestive heart failure) ([place]) 09/05/2018  . CHF (congestive heart failure) ([place]) 09/04/2018  . HCAP (healthcare-associated pneumonia) 03/29/2017  . Cellulitis 03/03/2017  . Type 2 diabetes mellitus with other specified complication ([place]) 18/84/1660  . CKD (chronic kidney disease), stage III 03/03/2017  . Status post placement of cardiac pacemaker 10/29/2016  . Pulmonary edema 10/23/2016  . Acute respiratory failure with hypoxia ([place]) 02/22/2015  . CAP (community acquired pneumonia) 02/22/2015  . Fall at home 09/21/2014  . Mild aortic stenosis 09/08/2014  . Mild to moderate  mitral stenosis 09/08/2014  . Chronic anticoagulation-Coumadin 09/08/2014  . Sepsis ([place]) 09/05/2014  . Elevated troponin 09/05/2014  . Lower leg DVT (deep venous thromboembolism), chronic ([place]) 09/05/2014  . Essential hypertension  09/05/2014  . Depression 09/05/2014  . Poorly controlled diabetes mellitus ([place]) 09/05/2014  . Hypomagnesemia 09/05/2014  . Hyponatremia 09/05/2014  . Pyelonephritis 09/05/2014  . Chronic diastolic CHF (congestive heart failure) ([place])   . 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 GERD (gastroesophageal reflux disease) 07/08/2011  . Gastroparesis 07/08/2011  . IDA (iron deficiency anemia) 07/08/2011  . History of colonic polyps 07/08/2011  . 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 NAFLD (nonalcoholic fatty liver disease) 07/08/2011  . Obesity 07/08/2011    Orientation RESPIRATION BLADDER Height & Weight     Self, Time, Situation, Place  Normal Incontinent Weight: 108.3 kg Height:  5\' 8"  (172.7 cm)  BEHAVIORAL SYMPTOMS/MOOD NEUROLOGICAL BOWEL NUTRITION STATUS      Incontinent Diet  AMBULATORY STATUS COMMUNICATION OF NEEDS Skin   Extensive Assist Verbally Normal                       Personal Care Assistance Level of Assistance  Total care       Total Care Assistance: Maximum assistance   Functional Limitations Info             SPECIAL CARE FACTORS FREQUENCY  OT (By licensed OT)     PT Frequency: 5x/week OT Frequency: min 3x/week            Contractures Contractures Info: Not present    Additional Factors Info  Code Status, Allergies Code Status Info: Full Code Allergies Info: compazine, Demerol, Morphine, Penicillin           Current Medications (12/15/2018):  This is the current hospital active medication list  Current Facility-Administered Medications  Medication Dose Route Frequency Provider Last Rate Last Dose  . acetaminophen (TYLENOL) tablet 650 mg  650 mg Oral Q6H PRN Jerald Kief, MD   650 mg at 12/14/18 2426   Or  . acetaminophen (TYLENOL) suppository 650 mg  650 mg Rectal Q6H PRN Jerald Kief, MD      . albuterol (VENTOLIN HFA) 108 (90 Base) MCG/ACT inhaler 2 puff  2 puff Inhalation Q4H PRN Jerald Kief,  MD      . amLODipine (NORVASC) tablet 5 mg  5 mg Oral Daily Leroy Sea, MD   5 mg at 12/15/18 0959  . aspirin EC tablet 81 mg  81 mg Oral Daily Jerald Kief, MD   81 mg at 12/15/18 8341  . carvedilol (COREG) tablet 3.125 mg  3.125 mg Oral BID WC Jerald Kief, MD   3.125 mg at 12/15/18 0958  . clomiPRAMINE (ANAFRANIL) capsule 25 mg  25 mg Oral QID Leroy Sea, MD   25 mg at 12/15/18 1006  . clonazePAM (KLONOPIN) tablet 0.5 mg  0.5 mg Oral QHS Leroy Sea, MD   0.5 mg at 12/14/18 2150  . DULoxetine (CYMBALTA) DR capsule 60 mg  60 mg Oral Daily Jerald Kief, MD   60 mg at 12/15/18 1007  . [START ON 12/16/2018] furosemide (LASIX) tablet 20 mg  20 mg Oral BID Leroy Sea, MD      . gabapentin (NEURONTIN) capsule 100 mg  100 mg Oral BID Leroy Sea, MD   100 mg at 12/15/18 0959  . guaiFENesin-dextromethorphan (ROBITUSSIN DM) 100-10 MG/5ML syrup 10 mL  10 mL Oral Q4H PRN Jerald Kief, MD   10 mL at 12/14/18 2157  . insulin aspart (novoLOG) injection 0-5 Units  0-5 Units Subcutaneous QHS Singh, Prashant K, MD      . insulin aspart (novoLOG) injection 0-9 Units  0-9 Units Subcutaneous TID WC Singh, Prashant K, MD      . insulin glargine (LANTUS) injection 10 Units  10 Units Subcutaneous QHS Leroy Sea, MD      . Ipratropium-Albuterol (COMBIVENT) respimat 2 puff  2 puff Inhalation Q6H Jerald Kief, MD   2 puff at 12/15/18 1007  . ondansetron (ZOFRAN) injection 4 mg  4 mg Intravenous Q6H PRN Jerald Kief, MD      . pneumococcal 23 valent vaccine (PNU-IMMUNE) injection 0.5 mL  0.5 mL Intramuscular Tomorrow-1000 Dondiego, Richard, MD      . QUEtiapine (SEROQUEL) tablet 25 mg  25 mg Oral QHS Leroy Sea, MD   25 mg at 12/14/18 2149  . sodium chloride flush (NS) 0.9 % injection 10-40 mL  10-40 mL Intracatheter PRN Leroy Sea, MD      . vitamin C (ASCORBIC ACID) tablet 500 mg  500 mg Oral Daily Jerald Kief, MD   500 mg at 12/15/18 0959  .  Warfarin - Pharmacist Dosing Inpatient   Does not apply q1800 Jerald Kief, MD      . zinc sulfate capsule 220 mg  220 mg Oral Daily Jerald Kief, MD   220 mg at 12/15/18 1008     Discharge Medications: Please see discharge summary for a list of discharge medications.  Relevant Imaging Results:  Relevant Lab Results:   Additional Information SSN: [id number]  Huston Foley Jacklynn Ganong, RN

## 2018-12-15 NOTE — Discharge Instructions (Signed)
Follow with Primary MD [name], MD in 7 days   Get CBC, CMP, 2 view Chest X ray -  checked next visit within 1 week by Primary MD o   Activity: As tolerated with Full fall precautions use walker/cane & assistance as needed  Disposition Home   Diet: Soft Low Carb Heart Healthy with feeding assistance and aspiration precautions.  Accuchecks 4 times/day, Once in AM empty stomach and then before each meal. Log in all results and show them to your Prim.MD in 3 days. If any glucose reading is under 80 or above 300 call your Prim MD immidiately. Follow Low glucose instructions for glucose under 80 as instructed.  Special Instructions: If you have smoked or chewed Tobacco  in the last 2 yrs please stop smoking, stop any regular Alcohol  and or any Recreational drug use.  On your next visit with your primary care physician please Get Medicines reviewed and adjusted.  Please request your Prim.MD to go over all Hospital Tests and Procedure/Radiological results at the follow up, please get all Hospital records sent to your Prim MD by signing hospital release before you go home.  If you experience worsening of your admission symptoms, develop shortness of breath, life threatening emergency, suicidal or homicidal thoughts you must seek medical attention immediately by calling 911 or calling your MD immediately  if symptoms less severe.  You Must read complete instructions/literature along with all the possible adverse reactions/side effects for all the Medicines you take and that have been prescribed to you. Take any new Medicines after you have completely understood and accpet all the possible adverse reactions/side effects.       Person Under Monitoring Name: Rebecca [name]  Location: [address] [place] [address]   Infection Prevention Recommendations for Individuals Confirmed to have, or Being Evaluated for, 2019 Novel Coronavirus (COVID-19) Infection Who Receive Care at  Home  Individuals who are confirmed to have, or are being evaluated for, COVID-19 should follow the prevention steps below until a healthcare provider or local or state health department says they can return to normal activities.  Stay home except to get medical care You should restrict activities outside your home, except for getting medical care. Do not go to work, school, or public areas, and do not use public transportation or taxis.  Call ahead before visiting your doctor Before your medical appointment, call the healthcare provider and tell them that you have, or are being evaluated for, COVID-19 infection. This will help the healthcare providers office take steps to keep other people from getting infected. Ask your healthcare provider to call the local or state health department.  Monitor your symptoms Seek prompt medical attention if your illness is worsening (e.g., difficulty breathing). Before going to your medical appointment, call the healthcare provider and tell them that you have, or are being evaluated for, COVID-19 infection. Ask your healthcare provider to call the local or state health department.  Wear a facemask You should wear a facemask that covers your nose and mouth when you are in the same room with other people and when you visit a healthcare provider. People who live with or visit you should also wear a facemask while they are in the same room with you.  Separate yourself from other people in your home As much as possible, you should stay in a different room from other people in your home. Also, you should use a separate bathroom, if available.  Avoid sharing household items  You should not share dishes, drinking glasses, cups, eating utensils, towels, bedding, or other items with other people in your home. After using these items, you should wash them thoroughly with soap and water.  Cover your coughs and sneezes Cover your mouth and nose with a tissue  when you cough or sneeze, or you can cough or sneeze into your sleeve. Throw used tissues in a lined trash can, and immediately wash your hands with soap and water for at least 20 seconds or use an alcohol-based hand rub.  Wash your [facility] your hands often and thoroughly with soap and water for at least 20 seconds. You can use an alcohol-based hand sanitizer if soap and water are not available and if your hands are not visibly dirty. Avoid touching your eyes, nose, and mouth with unwashed hands.   Prevention Steps for Caregivers and Household Members of Individuals Confirmed to have, or Being Evaluated for, COVID-19 Infection Being Cared for in the Home  If you live with, or provide care at home for, a person confirmed to have, or being evaluated for, COVID-19 infection please follow these guidelines to prevent infection:  Follow healthcare providers instructions Make sure that you understand and can help the patient follow any healthcare provider instructions for all care.  Provide for the patients basic needs You should help the patient with basic needs in the home and provide support for getting groceries, prescriptions, and other personal needs.  Monitor the patients symptoms If they are getting sicker, call his or her medical provider and tell them that the patient has, or is being evaluated for, COVID-19 infection. This will help the healthcare providers office take steps to keep other people from getting infected. Ask the healthcare provider to call the local or state health department.  Limit the number of people who have contact with the patient  If possible, have only one caregiver for the patient.  Other household members should stay in another home or place of residence. If this is not possible, they should stay  in another room, or be separated from the patient as much as possible. Use a separate bathroom, if available.  Restrict visitors who do not have an  essential need to be in the home.  Keep older adults, very young children, and other sick people away from the patient Keep older adults, very young children, and those who have compromised immune systems or chronic health conditions away from the patient. This includes people with chronic heart, lung, or kidney conditions, diabetes, and cancer.  Ensure good ventilation Make sure that shared spaces in the home have good air flow, such as from an air conditioner or an opened window, weather permitting.  Wash your hands often  Wash your hands often and thoroughly with soap and water for at least 20 seconds. You can use an alcohol based hand sanitizer if soap and water are not available and if your hands are not visibly dirty.  Avoid touching your eyes, nose, and mouth with unwashed hands.  Use disposable paper towels to dry your hands. If not available, use dedicated cloth towels and replace them when they become wet.  Wear a facemask and gloves  Wear a disposable facemask at all times in the room and gloves when you touch or have contact with the patients blood, body fluids, and/or secretions or excretions, such as sweat, saliva, sputum, nasal mucus, vomit, urine, or feces.  Ensure the mask fits over your nose and mouth tightly, and do  not touch it during use.  Throw out disposable facemasks and gloves after using them. Do not reuse.  Wash your hands immediately after removing your facemask and gloves.  If your personal clothing becomes contaminated, carefully remove clothing and launder. Wash your hands after handling contaminated clothing.  Place all used disposable facemasks, gloves, and other waste in a lined container before disposing them with other household waste.  Remove gloves and wash your hands immediately after handling these items.  Do not share dishes, glasses, or other household items with the patient  Avoid sharing household items. You should not share dishes,  drinking glasses, cups, eating utensils, towels, bedding, or other items with a patient who is confirmed to have, or being evaluated for, COVID-19 infection.  After the person uses these items, you should wash them thoroughly with soap and water.  Wash laundry thoroughly  Immediately remove and wash clothes or bedding that have blood, body fluids, and/or secretions or excretions, such as sweat, saliva, sputum, nasal mucus, vomit, urine, or feces, on them.  Wear gloves when handling laundry from the patient.  Read and follow directions on labels of laundry or clothing items and detergent. In general, wash and dry with the warmest temperatures recommended on the label.  Clean all areas the individual has used often  Clean all touchable surfaces, such as counters, tabletops, doorknobs, bathroom fixtures, toilets, phones, keyboards, tablets, and bedside tables, every day. Also, clean any surfaces that may have blood, body fluids, and/or secretions or excretions on them.  Wear gloves when cleaning surfaces the patient has come in contact with.  Use a diluted bleach solution (e.g., dilute bleach with 1 part bleach and 10 parts water) or a household disinfectant with a label that says EPA-registered for coronaviruses. To make a bleach solution at home, add 1 tablespoon of bleach to 1 quart (4 cups) of water. For a larger supply, add  cup of bleach to 1 gallon (16 cups) of water.  Read labels of cleaning products and follow recommendations provided on product labels. Labels contain instructions for safe and effective use of the cleaning product including precautions you should take when applying the product, such as wearing gloves or eye protection and making sure you have good ventilation during use of the product.  Remove gloves and wash hands immediately after cleaning.  Monitor yourself for signs and symptoms of illness Caregivers and household members are considered close contacts, should  monitor their health, and will be asked to limit movement outside of the home to the extent possible. Follow the monitoring steps for close contacts listed on the symptom monitoring form.   ? If you have additional questions, contact your local health department or call the epidemiologist on call at [phone] (available 24/7). ? This guidance is subject to change. For the most up-to-date guidance from [facility], please refer to their website: [email or web address].hu

## 2018-12-15 NOTE — Discharge Summary (Addendum)
Rebecca [name] NTI:[id number] DOB: 26-Feb-1940 DOA: 12/08/2018  PCP: [name], MD  Admit date: 12/08/2018  Discharge date: 12/16/2018  Admitted From: Home   Disposition:  Home - refused SNF >> Now SNF - family refused to take her   Recommendations for Outpatient Follow-up:   Follow up with PCP in 1-2 weeks  PCP Please obtain BMP/CBC, 2 view CXR in 1week,  (see Discharge instructions)   PCP Please follow up on the following pending results: Kindly titrate down her sedating medications.  Patient remains quite sedated at home per family.  Was noted here as well.  Check INR in 1 week.   Home Health: PT, RN Equipment/Devices: None  Consultations: None  Discharge Condition: Stable    CODE STATUS: Full    Diet Recommendation: Heart Healthy Low Carb    Chief Complaint  Patient presents with  . Shortness of Breath     Brief history of present illness from the day of admission and additional interim summary    Patient is a 78 y.o. 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                                                                 Hospital Course   Acute Hypoxic Resp Failure due to Covid 19 Viral pneumonia: She was treated with IV steroids and remdesivir combination, now symptom-free and on room air for the last 2 days.  Trend is stable will be discharged home with PCP follow-up.Po steroid taper.  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  Daughter understands that patient can cause sedation, aspiration and death in the patient.  She accepts the risk.  UTI.  Noted cultures.    Treated with Levaquin.  Elevated troponin secondary to demand ischemia: Evaluated by cardiology-continue supportive care.  Chronic diastolic heart failure: Euvolemic-Lasix  History of complete heart block-s/p permanent pacemaker placement September 2018: Continue telemetry monitoring  HTN - Norvasc added.  Persistent atrial fibrillation: Rate controlled-with Coreg-on Coumadin.  Lab Results  Component Value Date   INR 2.2 (H) 12/16/2018   INR 2.8 (H) 12/15/2018   INR 3.0 (H) 12/14/2018     Aortic stenosis/'s mitral stenosis: Stable for outpatient follow-up  History of recurrent venous thromboembolism: INR therapeutic-Coumadin per pharmacy  Depression: Stable-continue with Cymbalta and Seroquel  Obesity: BMI, follow with PCP for weight loss.  DM-2: Poor outpatient control due to hyperglycemia, for now continue home regimen and follow with PCP for further adjustment.  Lab Results  Component Value Date   HGBA1C 9.2 (H) 09/04/2018      Discharge diagnosis     Principal Problem:   Pneumonia due to COVID-19 virus Active Problems:   GERD (gastroesophageal reflux disease)   IDA (iron deficiency anemia)   NAFLD (nonalcoholic fatty liver disease)   Elevated troponin   Essential hypertension   Depression   Hyponatremia   Chronic diastolic CHF (congestive heart failure) (HCC)   CKD (chronic kidney disease), stage III   History of recurrent deep vein thrombosis (DVT)   Type 2 diabetes mellitus (HCC)   Acute on chronic respiratory failure [facility])    Discharge instructions     Discharge Instructions    MyChart COVID-19 home monitoring program   Complete by: Dec 15, 2018    Is the patient willing to use the MyChart Mobile App for home monitoring?: Yes   Temperature monitoring   Complete by: Dec 15, 2018    After how many days would you like to receive a notification of this patient's flowsheet entries?: 1   Discharge instructions   Complete by: As directed    Follow with Primary MD [name], MD in 7 days   Get CBC, CMP, 2 view Chest X ray -  checked next visit within 1 week by Primary MD o   Activity: As tolerated with Full fall precautions use walker/cane & assistance as needed  Disposition Home   Diet: Soft Low Carb Heart Healthy with feeding assistance and aspiration precautions.  Accuchecks 4 times/day, Once in AM empty stomach and then before each meal. Log in all results and show them to your Prim.MD in 3 days. If any glucose reading is under 80 or above 300 call your Prim MD immidiately. Follow Low glucose instructions for glucose under 80 as instructed.  Special Instructions: If you have smoked or chewed Tobacco  in the last 2 yrs please stop smoking, stop any regular Alcohol  and or any Recreational drug use.  On your next visit with your primary care physician please Get Medicines reviewed and adjusted.  Please request your Prim.MD to go over all Hospital Tests and Procedure/Radiological results at the follow up, please get all Hospital records sent to your Prim MD by signing hospital release before you go home.  If you experience worsening of your admission symptoms, develop shortness of breath, life threatening emergency, suicidal or homicidal thoughts you must seek medical attention immediately by calling 911 or calling your MD immediately  if symptoms less severe.  You Must read complete instructions/literature along with all the possible adverse reactions/side effects for all the Medicines you take and that have been prescribed to  you. Take any new Medicines after you have completely understood and accpet all the possible adverse reactions/side effects.   Increase activity slowly   Complete by: As directed       Discharge Medications   Allergies as of 12/16/2018      Reactions   Codeine Anxiety   Compazine Anaphylaxis   Other Anaphylaxis, Rash, Other (See Comments)   Uncoded Allergy. Allergen: INOVAR Uncoded Allergy. Allergen: ALL ADHESIVES Uncoded Allergy. Allergen: SILK SUTURE Uncoded Allergy. Allergen: merthiolate Thermasol   Penicillins Shortness Of Breath, Rash  Has patient had a PCN reaction causing immediate rash, facial/tongue/throat swelling, SOB or lightheadedness with hypotension: Yes Has patient had a PCN reaction causing severe rash involving mucus membranes or skin necrosis: No Has patient had a PCN reaction that required hospitalization No Has patient had a PCN reaction occurring within the last 10 years: Yes If all of the above answers are "NO", then may proceed with Cephalosporin use.   Morphine And Related Other (See Comments)   Patient states that it makes her lose her state of mind.    Demerol [meperidine] Rash      Medication List    STOP taking these medications   traMADol 50 MG tablet Commonly known as: ULTRAM   traZODone 50 MG tablet Commonly known as: DESYREL     TAKE these medications   acetaminophen 500 MG tablet Commonly known as: TYLENOL Take 1,000 mg by mouth 2 (two) times daily.   amLODipine 10 MG tablet Commonly known as: NORVASC Take 1 tablet (10 mg total) by mouth daily.   Bydureon 2 MG Pen Generic drug: Exenatide ER Inject 2 mg into the skin once a week.   carvedilol 3.125 MG tablet Commonly known as: COREG TAKE 1 TABLET BY MOUTH TWICE DAILY WITH A MEAL What changed:   how much to take  how to take this  when to take this  additional instructions   clobetasol cream 0.05 % Commonly known as: TEMOVATE Apply 1 application topically daily.    clomiPRAMINE 25 MG capsule Commonly known as: ANAFRANIL Take 25 mg by mouth 4 (four) times daily.   clonazePAM 1 MG tablet Commonly known as: KLONOPIN Take 1 tablet (1 mg total) by mouth at bedtime. What changed:   medication strength  how much to take   DULoxetine 60 MG capsule Commonly known as: CYMBALTA Take 60 mg by mouth daily.   ferrous sulfate 325 (65 FE) MG tablet Take 325 mg by mouth 2 (two) times daily with a meal.   furosemide 40 MG tablet Commonly known as: LASIX Take 20 mg by mouth 2 (two) times daily.   gabapentin 300 MG capsule Commonly known as: NEURONTIN Take 300 mg by mouth 2 (two) times daily.   insulin glargine 100 unit/mL Sopn Commonly known as: LANTUS Inject 24 Units into the skin daily.   metFORMIN 500 MG tablet Commonly known as: GLUCOPHAGE Take 500 mg by mouth 2 (two) times daily with a meal.   methylPREDNISolone 4 MG Tbpk tablet Commonly known as: MEDROL DOSEPAK follow package directions   mirtazapine 7.5 MG tablet Commonly known as: REMERON Take 1 tablet (7.5 mg total) by mouth at bedtime. What changed:   medication strength  how much to take   multivitamin with minerals Tabs tablet Take 1 tablet by mouth daily.   potassium chloride 10 MEQ tablet Commonly known as: KLOR-CON Take 1 tablet (10 mEq total) by mouth daily. What changed: when to take this   QUEtiapine 100 MG tablet Commonly known as: SEROQUEL Take 100 mg by mouth at bedtime.   vitamin C 500 MG tablet Commonly known as: ASCORBIC ACID Take 500 mg by mouth daily.   Vitamin D3 75 MCG (3000 UT) Tabs Take 1 capsule by mouth daily.   warfarin 5 MG tablet Commonly known as: COUMADIN Take 2.5-5 mg by mouth every evening. Starting 10/29/2016 take 2.5 mg alternating with 5 mg daily       Follow-up Information    Oval Linseyondiego, Richard, MD. Schedule an appointment as soon as possible  for a visit in 1 week(s).   Specialty: Internal Medicine Contact information: [place] [address] [phone]        Herminio Commons, MD .   Specialty: Cardiology Contact information: [place] [address] [phone]           Major procedures and Radiology Reports - PLEASE review detailed and final reports thoroughly  -         Dg Chest Port 1 View  Result Date: 12/08/2018 CLINICAL DATA:  Shortness of breath for 2 days. EXAM: PORTABLE CHEST 1 VIEW COMPARISON:  Chest radiograph 09/03/2018 and CT 09/04/2018 FINDINGS: A dual lead pacemaker remains in place. The cardiac silhouette remains enlarged. Hazy airspace opacity is again seen in both lungs, slightly worse than on the prior study. The interstitial markings are mildly increased diffusely. No sizable pleural effusion or pneumothorax is identified. No acute osseous abnormality is seen. Aortic atherosclerosis is noted. IMPRESSION: Cardiomegaly with bilateral airspace opacities which are mildly increased compared to 09/03/2018 and may reflect edema or infection. Electronically Signed   By: Logan Bores M.D.   On: 12/08/2018 20:56    Micro Results     Recent Results (from the past 240 hour(s))  SARS Coronavirus 2 by RT PCR (hospital order, performed in [facility] hospital lab) Nasopharyngeal Nasopharyngeal Swab     Status: Abnormal   Collection Time: 12/08/18  8:12 PM   Specimen: Nasopharyngeal Swab  Result Value Ref Range Status   SARS Coronavirus 2 POSITIVE (A) NEGATIVE Final    Comment: RESULT CALLED TO, READ BACK BY AND VERIFIED WITH: BELTON,K @ 2155 ON 12/08/18 BY JUW Performed at [facility], [address], [place], [place] [address]   Urine culture     Status: Abnormal   Collection Time: 12/08/18  8:46 PM   Specimen: Urine, Catheterized  Result Value Ref Range Status   Specimen Description   Final    URINE, CATHETERIZED Performed at [facility], [address] [place], [place] [address]    Special Requests   Final    NONE Performed at [facility], [address], [place], [place] [address]    Culture (A)  Final    >=100,000 COLONIES/mL ESCHERICHIA COLI >=100,000 COLONIES/mL AEROCOCCUS URINAE Standardized susceptibility testing for this organism is not available. Performed at [facility], [place] [address], [place], [place] [address]    Report Status 12/12/2018 FINAL  Final   Organism ID, Bacteria ESCHERICHIA COLI (A)  Final      Susceptibility   Escherichia coli - MIC*    AMPICILLIN 4 SENSITIVE Sensitive     CEFAZOLIN <=4 SENSITIVE Sensitive     CEFTRIAXONE <=1 SENSITIVE Sensitive     CIPROFLOXACIN <=0.25 SENSITIVE Sensitive     GENTAMICIN <=1 SENSITIVE Sensitive     IMIPENEM <=0.25 SENSITIVE Sensitive     NITROFURANTOIN 32 SENSITIVE Sensitive     TRIMETH/SULFA <=20 SENSITIVE Sensitive     AMPICILLIN/SULBACTAM <=2 SENSITIVE Sensitive     PIP/TAZO <=4 SENSITIVE Sensitive     Extended ESBL NEGATIVE Sensitive     * >=100,000 COLONIES/mL ESCHERICHIA COLI  Blood Culture (routine x 2)     Status: None   Collection Time: 12/08/18  9:13 PM   Specimen: BLOOD RIGHT ARM  Result Value Ref Range Status   Specimen Description BLOOD RIGHT ARM  Final   Special Requests   Final    BOTTLES DRAWN AEROBIC AND ANAEROBIC Blood Culture adequate  volume   Culture   Final    NO GROWTH 6 DAYS Performed at [facility], [address] [place], [place] [address]    Report Status 12/14/2018 FINAL  Final  Blood Culture (routine x 2)     Status: None   Collection Time: 12/08/18  9:30 PM   Specimen: BLOOD RIGHT HAND  Result Value Ref Range Status   Specimen Description BLOOD RIGHT HAND  Final   Special Requests   Final    BOTTLES DRAWN AEROBIC ONLY Blood Culture adequate volume   Culture   Final    NO GROWTH 6 DAYS Performed at [facility], [address] [place], [place] [address]    Report Status 12/14/2018 FINAL  Final  MRSA PCR Screening     Status: None   Collection Time: 12/10/18  6:38 AM   Specimen: Nasopharyngeal  Result Value  Ref Range Status   MRSA by PCR NEGATIVE NEGATIVE Final    Comment:        The GeneXpert MRSA Assay (FDA approved for NASAL specimens only), is one component of a comprehensive MRSA colonization surveillance program. It is not intended to diagnose MRSA infection nor to guide or monitor treatment for MRSA infections. Performed at [facility], [address] [place], [place] [address]     Today   Subjective    Rebecca BuffRena [name] today has no headache,no chest abdominal pain,no new weakness tingling or numbness, feels much better wants to go home today.     Objective   Blood pressure (!) 131/110, pulse 63, temperature 97.8 F (36.6 C), temperature source Oral, resp. rate 15, height 5\' 8"  (1.727 m), weight 108.3 kg, SpO2 91 %.   Intake/Output Summary (Last 24 hours) at 12/16/2018 0926 Last data filed at 12/16/2018 0600 Gross per 24 hour  Intake 240 ml  Output 1500 ml  Net -1260 ml    Exam Awake Alert,   No new F.N deficits, Normal affect [place].AT,PERRAL Supple Neck,No JVD, No cervical lymphadenopathy appriciated.  Symmetrical Chest wall movement, Good air movement bilaterally, CTAB RRR,No Gallops,Rubs or new Murmurs, No Parasternal Heave +ve B.Sounds, Abd Soft, Non tender, No organomegaly appriciated, No rebound -guarding or rigidity. No Cyanosis, Clubbing or edema, No new Rash or bruise   Data Review   CBC w Diff:  Lab Results  Component Value Date   WBC 11.3 (H) 12/15/2018   HGB 14.0 12/15/2018   HCT 42.4 12/15/2018   PLT 318 12/15/2018   LYMPHOPCT 21 12/15/2018   MONOPCT 10 12/15/2018   EOSPCT 3 12/15/2018   BASOPCT 0 12/15/2018    CMP:  Lab Results  Component Value Date   NA 139 12/15/2018   NA 137 11/08/2014   K 3.5 12/15/2018   CL 103 12/15/2018   CO2 26 12/15/2018   BUN 36 (H) 12/15/2018   BUN 29 (H) 11/08/2014   CREATININE 0.85 12/15/2018   CREATININE 0.95 07/29/2012   PROT 6.4 (L) 12/15/2018   PROT 7.4 11/08/2014   ALBUMIN 2.9 (L)  12/15/2018   ALBUMIN 4.4 11/08/2014   BILITOT 0.9 12/15/2018   BILITOT 0.2 11/08/2014   ALKPHOS 43 12/15/2018   AST 18 12/15/2018   ALT 23 12/15/2018  .   Total Time in preparing paper work, data evaluation and todays exam - 35 minutes  Susa RaringPrashant Hines Kloss M.D on 12/16/2018 at 9:26 AM  [facility]   Office  [phone]

## 2018-12-16 ENCOUNTER — Ambulatory Visit: Payer: Medicare Other | Admitting: Student

## 2018-12-16 LAB — PROTIME-INR
INR: 2.2 — ABNORMAL HIGH (ref 0.8–1.2)
Prothrombin Time: 24.4 seconds — ABNORMAL HIGH (ref 11.4–15.2)

## 2018-12-16 LAB — BRAIN NATRIURETIC PEPTIDE: B Natriuretic Peptide: 683.1 pg/mL — ABNORMAL HIGH (ref 0.0–100.0)

## 2018-12-16 LAB — GLUCOSE, CAPILLARY: Glucose-Capillary: 182 mg/dL — ABNORMAL HIGH (ref 70–99)

## 2018-12-16 MED ORDER — INFLUENZA VAC A&B SA ADJ QUAD 0.5 ML IM PRSY
0.5000 mL | PREFILLED_SYRINGE | INTRAMUSCULAR | Status: AC
  Administered 2018-12-16: 0.5 mL via INTRAMUSCULAR
  Filled 2018-12-16: qty 0.5

## 2018-12-16 MED ORDER — AMLODIPINE BESYLATE 5 MG PO TABS
5.0000 mg | ORAL_TABLET | Freq: Once | ORAL | Status: AC
  Administered 2018-12-16: 5 mg via ORAL
  Filled 2018-12-16: qty 1

## 2018-12-16 MED ORDER — METHYLPREDNISOLONE 4 MG PO TBPK: ORAL_TABLET | ORAL | 0 refills | Status: DC

## 2018-12-16 MED ORDER — AMLODIPINE BESYLATE 10 MG PO TABS: 10.0000 mg | ORAL_TABLET | Freq: Every day | ORAL | 0 refills | Status: DC

## 2018-12-16 MED ORDER — AMLODIPINE BESYLATE 10 MG PO TABS: 10.0000 mg | ORAL_TABLET | Freq: Every day | ORAL | Status: DC

## 2018-12-16 MED ORDER — WARFARIN SODIUM 2.5 MG PO TABS
2.5000 mg | ORAL_TABLET | Freq: Once | ORAL | Status: DC
  Filled 2018-12-16: qty 1

## 2018-12-16 MED ORDER — PREDNISONE 20 MG PO TABS
25.0000 mg | ORAL_TABLET | Freq: Every day | ORAL | Status: DC
  Administered 2018-12-16: 25 mg via ORAL
  Filled 2018-12-16 (×2): qty 0.5

## 2018-12-16 NOTE — Progress Notes (Signed)
Physical Therapy Treatment Patient Details Name: Rebecca [name] MRN: [id number] DOB: August 19, 1940 Today's Date: 12/16/2018    History of Present Illness 78 y.o. female presenting to ED with progressively worse hypoxia and weakness for the past 2 days. Pt SpO2 found to be 78% on 2L. Tested COVID + and transfered to [place]. PMH including aortic stenosis, cataplexy, diastolic heart failure, essential hypertension, GERD/hiatal hernia, history of recurrent DVT, narcolepsy, peripheral neuropathy, unspecified chronic renal disease, type 2 diabetes, morbid obesity on home oxygen (2L at night).    PT Comments    The patient is slowly improving with mobility. Apparantly, patient going to SNF.    Follow Up Recommendations  SNF     Equipment Recommendations  None recommended by PT    Recommendations for Other Services       Precautions / Restrictions Precautions Precautions: Fall Precaution Comments: Very weak and fatigues quickly    Mobility  Bed Mobility   Bed Mobility: Sidelying to Sit;Rolling Rolling: Min assist Sidelying to sit: Mod assist       General bed mobility comments: assist to raise trunk and place legs over bed edge  Transfers Overall transfer level: Needs assistance Equipment used: Rolling walker (2 wheeled) Transfers: Sit to/from [employer] Transfers   Stand pivot transfers: Mod assist       General transfer comment: Pt requiring Mod A to power up into standing. Requiring Mod A for balance, cues to stand more erect  at RW during pivot to recliner  Ambulation/Gait                 Stairs             Wheelchair Mobility    Modified Rankin (Stroke Patients Only)       Balance Overall balance assessment: Needs assistance Sitting-balance support: No upper extremity supported;Feet supported Sitting balance-Leahy Scale: Fair     Standing balance support: Bilateral upper extremity supported;During functional activity Standing  balance-Leahy Scale: Poor                              Cognition Arousal/Alertness: Awake/alert Behavior During Therapy: WFL for tasks assessed/performed Overall Cognitive Status: Within Functional Limits for tasks assessed                                 General Comments: Pt with fatigue and requiring increased time throughout for ADLs  and much encouragement      Exercises      General Comments        Pertinent Vitals/Pain Faces Pain Scale: Hurts a little bit Pain Location: generalized Pain Descriptors / Indicators: Grimacing Pain Intervention(s): Monitored during session    Home Living                      Prior Function            PT Goals (current goals can now be found in the care plan section) Progress towards PT goals: Progressing toward goals    Frequency    Min 2X/week      PT Plan Current plan remains appropriate;Discharge plan needs to be updated    Co-evaluation              AM-PAC PT "6 Clicks" Mobility   Outcome Measure  Help needed turning from your back to your side while in a  flat bed without using bedrails?: A Lot Help needed moving from lying on your back to sitting on the side of a flat bed without using bedrails?: A Lot Help needed moving to and from a bed to a chair (including a wheelchair)?: A Lot Help needed standing up from a chair using your arms (e.g., wheelchair or bedside chair)?: A Lot Help needed to walk in hospital room?: Total Help needed climbing 3-5 steps with a railing? : Total 6 Click Score: 10    End of Session   Activity Tolerance: Patient tolerated treatment well Patient left: in chair;with call bell/phone within reach;with chair alarm set Nurse Communication: Mobility status       Time: 9678-9381 PT Time Calculation (min) (ACUTE ONLY): 29 min  Charges:  $Therapeutic Activity: 23-37 mins                     Blanchard Kelch PT Acute Rehabilitation Services Pager  [phone] Office [phone]    Rada Hay 12/16/2018, 3:21 PM

## 2018-12-16 NOTE — Plan of Care (Signed)
Pt discharge via EMS

## 2018-12-16 NOTE — TOC Transition Note (Signed)
Transition of Care [facility]) - CM/SW Discharge Note   Patient Details  Name: Rebecca [name] MRN: [id number] Date of Birth: March 28, 1940  [facility] of [facility]) CM/SW Contact:  Ninfa Meeker, RN Phone Number: [phone] (working remotely) 12/16/2018, 10:53 AM   Clinical Narrative:   Patient will DC to : [facility] date: 12/16/18 Family notified: Daughter: Eulis Foster [phone] Transport by: Corey Harold : 11:00am  RN to call report to  Patient is medically ready for discharge per MD. Will DC to [facility] today. Patient's daughter, Bedside RN, Camera operator and facility are aware of discharge plan. Case manager sent DC summary, FL2 to [place] via the [place].  Bedside RN to call report to [phone]. Ambulance transport has been requested for patient.       Final next level of care: Skilled Nursing Facility Barriers to Discharge: Insurance Authorization   Patient Goals and CMS Choice Patient states their goals for this hospitalization and ongoing recovery are:: get better - per daughter   Choice offered to / list presented to : Adult Children  Discharge Placement                       Discharge Plan and Services   Discharge Planning Services: CM Consult Post Acute Care Choice: NA          DME Arranged: N/A         HH Arranged: NA HH Agency: NA        Social Determinants of Health (SDOH) Interventions     Readmission Risk Interventions No flowsheet data found.

## 2018-12-16 NOTE — Progress Notes (Signed)
PROGRESS NOTE                                                                                                                                                                                                             Patient Demographics:    Rebecca [name], is a 78 y.o. female, DOB - [birth year], VOZ:[id number]  Outpatient Primary MD for the patient is [name], MD   Admit date - 12/08/2018   LOS - 8  Chief Complaint  Patient presents with   Shortness of Breath       Brief Narrative: Patient is a 78 y.o. 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   Subjective:   Patient in bed, appears comfortable, denies any headache, no fever, no chest pain or pressure, no shortness of breath , no abdominal pain. No focal weakness.   Assessment  & Plan :   Acute Hypoxic Resp Failure due to Covid 19 Viral pneumonia: She has been adequately treated with IV steroids and remdesivir initially was requiring 6 L nasal cannula oxygen currently down to RA - 2lits.  Has Finished IV Rx and now symptom free. Po steroid taper.   SpO2: 91 % O2 Flow Rate (L/min): 2 L/min FiO2 (%): 90 %    COVID-19 Labs: Recent Labs    12/14/18 0415 12/15/18 0446  DDIMER 0.34 0.34  FERRITIN 376* 233  CRP 10.4* 4.9*    Lab Results  Component Value Date   SARSCOV2NAA POSITIVE (A) 12/08/2018   SARSCOV2NAA NEGATIVE 09/03/2018     COVID-19 Medications: Steroids: 11/3>> Remdesivir: 11/4>> Actemra: Not indicated-clinically improving  Convalescent Plasma: Not indicated-clinically improving Research Studies:N/A    Anxiety and depression.  Had become excessively sedated due to overuse of medications  on 12/12/2018, medications were held doses reduced, mentation much improved.  Patient has been counseled on medication dose and frequency understands and accepts the plan.  Also daughter was updated on this on 12/12/2018 and again on 12/14/2018.  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  Daughter understands that  patient can cause sedation, aspiration and death in the patient.  She accepts the risk.  UTI.  Noted cultures.  Currently on Levaquin will stop after 5 doses.    Elevated troponin secondary to demand ischemia: Evaluated by cardiology-continue supportive care.  Chronic diastolic heart failure: Euvolemic-Lasix  HTN - added Norvasc.  History of complete heart block-s/p permanent pacemaker placement September 2018: Continue telemetry monitoring  Persistent atrial fibrillation: Rate controlled-with Coreg-on Coumadin.  Aortic stenosis/'s mitral stenosis: Stable for outpatient follow-up  History of recurrent venous thromboembolism: INR therapeutic-Coumadin per pharmacy  Lab Results  Component Value Date   INR 2.2 (H) 12/16/2018   INR 2.8 (H) 12/15/2018   INR 3.0 (H) 12/14/2018    DM-2: CBGs relatively stable-continue Lantus 20 units and SSI  CBG (last 3)  Recent Labs    12/15/18 1139 12/15/18 1639 12/15/18 2029  GLUCAP 147* 198* 366*   Narcolepsy/cataplexy/insomnia: Continue clomipramine, mirtazapine, and home dosage of Klonopin at bedtime  Depression: Stable-continue with Cymbalta and Seroquel  Obesity: BMI, follow with PCP for weight loss.    Condition - Stable  Family Communication  : daughter 11/7 - understands holding sedatives, warns she gets mean and has a narcissistic personality.  Daughter states that she understands that patient sedatives have to be held and she agrees with the plan.  Daughter updated again on 12/14/2018 at  10:45 AM.  Code Status :  Full Code  Diet :  Diet Order            DIET SOFT Room service appropriate? Yes; Fluid consistency: Thin  Diet effective now               Disposition Plan  :  Remain hospitalized - HHPT  Barriers to discharge: Hypoxia requiring O2 supplementation/complete 5 days of IV Remdesivir, 12/15/18  Consults  :  None  Procedures  :  None   Antibiotics  :    Anti-infectives (From admission, onward)   Start     Dose/Rate Route Frequency Ordered Stop   12/13/18 1400  levofloxacin (LEVAQUIN) tablet 500 mg     500 mg Oral Every 24 hours 12/13/18 0823 12/14/18 1521   12/10/18 1600  remdesivir 100 mg in sodium chloride 0.9 % 250 mL IVPB     100 mg 500 mL/hr over 30 Minutes Intravenous Every 24 hours 12/09/18 1431 12/13/18 1619   12/10/18 1400  levofloxacin (LEVAQUIN) IVPB 500 mg  Status:  Discontinued     500 mg 100 mL/hr over 60 Minutes Intravenous Every 24 hours 12/10/18 1308 12/13/18 0823   12/09/18 1600  remdesivir 200 mg in sodium chloride 0.9 % 250 mL IVPB     200 mg 500 mL/hr over 30 Minutes Intravenous Once 12/09/18 1431 12/09/18 1658      DVT Prophylaxis  : Coumadin  Inpatient Medications  Scheduled Meds:  amLODipine  5 mg Oral Daily   aspirin EC  81 mg Oral Daily   carvedilol  3.125 mg Oral BID WC   clomiPRAMINE  25 mg Oral QID   clonazePAM  0.5 mg Oral QHS   DULoxetine  60 mg Oral Daily   furosemide  20 mg Oral BID   gabapentin  100 mg Oral BID   insulin aspart  0-5 Units Subcutaneous QHS   insulin aspart  0-9 Units Subcutaneous TID WC   insulin glargine  10 Units Subcutaneous QHS   Ipratropium-Albuterol  2 puff Inhalation Q6H   pneumococcal 23 valent vaccine  0.5 mL Intramuscular Tomorrow-1000  QUEtiapine  25 mg Oral QHS   vitamin C  500 mg Oral Daily   Warfarin - Pharmacist Dosing Inpatient   Does not apply q1800   Continuous Infusions:  PRN Meds:.acetaminophen **OR** acetaminophen, albuterol,  guaiFENesin-dextromethorphan, [DISCONTINUED] ondansetron **OR** ondansetron (ZOFRAN) IV   Time Spent in minutes  25  See all Orders from today for further details   Susa RaringPrashant Griffyn Kucinski M.D on 12/16/2018 at 9:22 AM  To page go to [email or web address] - use universal password  [facility] -  Office  [phone]    Objective:   Vitals:   12/15/18 1702 12/15/18 2000 12/15/18 2200 12/16/18 0754  BP:  133/63 (!) 146/97 (!) 131/110  Pulse: 60   63  Resp:      Temp:  97.7 F (36.5 C) 97.8 F (36.6 C)   TempSrc:  Oral Oral   SpO2:      Weight:      Height:        Wt Readings from Last 3 Encounters:  12/08/18 108.3 kg  09/07/18 108.3 kg  03/02/18 99.8 kg     Intake/Output Summary (Last 24 hours) at 12/16/2018 [id number] Last data filed at 12/16/2018 0600 Gross per 24 hour  Intake 240 ml  Output 1500 ml  Net -1260 ml     Physical Exam  Awake Alert,   No new F.N deficits,   [place].AT,PERRAL Supple Neck,No JVD, No cervical lymphadenopathy appriciated.  Symmetrical Chest wall movement, Good air movement bilaterally, CTAB RRR,No Gallops, Rubs or new Murmurs, No Parasternal Heave +ve B.Sounds, Abd Soft, No tenderness, No organomegaly appriciated, No rebound - guarding or rigidity. No Cyanosis, Clubbing or edema, No new Rash or bruise    Data Review:    CBC Recent Labs  Lab 12/11/18 0948 12/12/18 0632 12/13/18 0016 12/14/18 0415 12/15/18 0446  WBC 12.6* 7.6 3.7* 5.5 11.3*  HGB 14.5 13.6 12.7 13.5 14.0  HCT 44.5 41.7 38.9 42.3 42.4  PLT 239 217 200 245 318  MCV 93.7 93.1 92.4 92.2 91.4  MCH 30.5 30.4 30.2 29.4 30.2  MCHC 32.6 32.6 32.6 31.9 33.0  RDW 15.5 15.5 15.2 15.1 15.1  LYMPHSABS 1.0 1.3 0.7 1.2 2.3  MONOABS 1.0 0.9 0.5 0.6 1.1*  EOSABS 0.0 0.0 0.0 0.0 0.3  BASOSABS 0.0 0.0 0.0 0.0 0.0    Chemistries  Recent Labs  Lab 12/11/18 0948 12/12/18 0632 12/13/18 0016 12/14/18 0415 12/15/18 0446  NA 135 137 137 136 139  K 4.2 3.7 3.8 4.2 3.5  CL 99 100 101 100  103  CO2 25 24 24 27 26   GLUCOSE 196* 162* 207* 194* 63*  BUN 37* 39* 36* 42* 36*  CREATININE 0.98 1.09* 0.98 0.94 0.85  CALCIUM 8.5* 8.4* 8.3* 8.5* 8.4*  MG 1.9 2.0 2.0 1.9 1.8  AST 56* 36 28 20 18   ALT 30 25 26 25 23   ALKPHOS 51 44 43 44 43  BILITOT 1.3* 1.1 0.7 0.7 0.9   ------------------------------------------------------------------------------------------------------------------ No results for input(s): CHOL, HDL, LDLCALC, TRIG, CHOLHDL, LDLDIRECT in the last 72 hours.  Lab Results  Component Value Date   HGBA1C 9.2 (H) 09/04/2018   ------------------------------------------------------------------------------------------------------------------ No results for input(s): TSH, T4TOTAL, T3FREE, THYROIDAB in the last 72 hours.  Invalid input(s): FREET3 ------------------------------------------------------------------------------------------------------------------ Recent Labs    12/14/18 0415 12/15/18 0446  FERRITIN 376* 233    Coagulation profile Recent Labs  Lab 12/12/18 0632 12/13/18 0016 12/14/18 0415 12/15/18 0446 12/16/18 0010  INR 2.4* 2.7* 3.0* 2.8* 2.2*  Recent Labs    12/14/18 0415 12/15/18 0446  DDIMER 0.34 0.34    Cardiac Enzymes No results for input(s): CKMB, TROPONINI, MYOGLOBIN in the last 168 hours.  Invalid input(s): CK ------------------------------------------------------------------------------------------------------------------    Component Value Date/Time   BNP 683.1 (H) 12/16/2018 0010    Micro Results Recent Results (from the past 240 hour(s))  SARS Coronavirus 2 by RT PCR (hospital order, performed in [facility] hospital lab) Nasopharyngeal Nasopharyngeal Swab     Status: Abnormal   Collection Time: 12/08/18  8:12 PM   Specimen: Nasopharyngeal Swab  Result Value Ref Range Status   SARS Coronavirus 2 POSITIVE (A) NEGATIVE Final    Comment: RESULT CALLED TO, READ BACK BY AND VERIFIED WITH: BELTON,K @ 2155 ON 12/08/18 BY  JUW Performed at [facility], [address] [place], [place] [address]   Urine culture     Status: Abnormal   Collection Time: 12/08/18  8:46 PM   Specimen: Urine, Catheterized  Result Value Ref Range Status   Specimen Description   Final    URINE, CATHETERIZED Performed at [facility], [address], [place], [place] [address]    Special Requests   Final    NONE Performed at [facility], [address], [place], [place] [address]    Culture (A)  Final    >=100,000 COLONIES/mL ESCHERICHIA COLI >=100,000 COLONIES/mL AEROCOCCUS URINAE Standardized susceptibility testing for this organism is not available. Performed at [facility], [place] [address] [place], [place] [address]    Report Status 12/12/2018 FINAL  Final   Organism ID, Bacteria ESCHERICHIA COLI (A)  Final      Susceptibility   Escherichia coli - MIC*    AMPICILLIN 4 SENSITIVE Sensitive     CEFAZOLIN <=4 SENSITIVE Sensitive     CEFTRIAXONE <=1 SENSITIVE Sensitive     CIPROFLOXACIN <=0.25 SENSITIVE Sensitive     GENTAMICIN <=1 SENSITIVE Sensitive     IMIPENEM <=0.25 SENSITIVE Sensitive     NITROFURANTOIN 32 SENSITIVE Sensitive     TRIMETH/SULFA <=20 SENSITIVE Sensitive     AMPICILLIN/SULBACTAM <=2 SENSITIVE Sensitive     PIP/TAZO <=4 SENSITIVE Sensitive     Extended ESBL NEGATIVE Sensitive     * >=100,000 COLONIES/mL ESCHERICHIA COLI  Blood Culture (routine x 2)     Status: None   Collection Time: 12/08/18  9:13 PM   Specimen: BLOOD RIGHT ARM  Result Value Ref Range Status   Specimen Description BLOOD RIGHT ARM  Final   Special Requests   Final    BOTTLES DRAWN AEROBIC AND ANAEROBIC Blood Culture adequate volume   Culture   Final    NO GROWTH 6 DAYS Performed at [facility], [address] [place], [place] [address]    Report Status 12/14/2018 FINAL  Final  Blood Culture (routine x 2)     Status: None   Collection Time: 12/08/18  9:30 PM   Specimen: BLOOD RIGHT HAND  Result Value Ref Range Status    Specimen Description BLOOD RIGHT HAND  Final   Special Requests   Final    BOTTLES DRAWN AEROBIC ONLY Blood Culture adequate volume   Culture   Final    NO GROWTH 6 DAYS Performed at [facility], [address] [place], [place] [address]    Report Status 12/14/2018 FINAL  Final  MRSA PCR Screening     Status: None   Collection Time: 12/10/18  6:38 AM   Specimen: Nasopharyngeal  Result Value Ref Range Status  MRSA by PCR NEGATIVE NEGATIVE Final    Comment:        The GeneXpert MRSA Assay (FDA approved for NASAL specimens only), is one component of a comprehensive MRSA colonization surveillance program. It is not intended to diagnose MRSA infection nor to guide or monitor treatment for MRSA infections. Performed at [facility], [address] [place], [place] [address]     Radiology Reports  Dg Chest Port 1 View  Result Date: 12/08/2018 CLINICAL DATA:  Shortness of breath for 2 days. EXAM: PORTABLE CHEST 1 VIEW COMPARISON:  Chest radiograph 09/03/2018 and CT 09/04/2018 FINDINGS: A dual lead pacemaker remains in place. The cardiac silhouette remains enlarged. Hazy airspace opacity is again seen in both lungs, slightly worse than on the prior study. The interstitial markings are mildly increased diffusely. No sizable pleural effusion or pneumothorax is identified. No acute osseous abnormality is seen. Aortic atherosclerosis is noted. IMPRESSION: Cardiomegaly with bilateral airspace opacities which are mildly increased compared to 09/03/2018 and may reflect edema or infection. Electronically Signed   By: Sebastian Ache M.D.   On: 12/08/2018 20:56

## 2018-12-16 NOTE — Progress Notes (Signed)
[place] for warfarin Indication: atrial fibrillation  Allergies  Allergen Reactions  . Codeine Anxiety  . Compazine Anaphylaxis  . Other Anaphylaxis, Rash and Other (See Comments)    Uncoded Allergy. Allergen: INOVAR Uncoded Allergy. Allergen: ALL ADHESIVES Uncoded Allergy. Allergen: SILK SUTURE Uncoded Allergy. Allergen: merthiolate Thermasol  . Penicillins Shortness Of Breath and Rash    Has patient had a PCN reaction causing immediate rash, facial/tongue/throat swelling, SOB or lightheadedness with hypotension: Yes Has patient had a PCN reaction causing severe rash involving mucus membranes or skin necrosis: No Has patient had a PCN reaction that required hospitalization No Has patient had a PCN reaction occurring within the last 10 years: Yes If all of the above answers are "NO", then may proceed with Cephalosporin use.   Marland Kitchen Morphine And Related Other (See Comments)    Patient states that it makes her lose her state of mind.   . Demerol [Meperidine] Rash    Patient Measurements: Height: 5\' 8"  (172.7 cm) Weight: 238 lb 12.1 oz (108.3 kg) IBW/kg (Calculated) : 63.9 Heparin Dosing Weight: 88 kg  Vital Signs: BP: 131/110 (11/11 0754) Pulse Rate: 63 (11/11 0754)  Labs: Recent Labs    12/14/18 0415 12/15/18 0446 12/16/18 0010  HGB 13.5 14.0  --   HCT 42.3 42.4  --   PLT 245 318  --   LABPROT 30.7* 28.8* 24.4*  INR 3.0* 2.8* 2.2*  CREATININE 0.94 0.85  --     Estimated Creatinine Clearance: 69.2 mL/min (by C-G formula based on SCr of 0.85 mg/dL).  Assessment: 22 YOF on warfarin PTA for Afib/recurrent DVT. Was briefly transitioned to IV heparin for ACS. Now resumed on warfarin per cardiology.  Home warfarin dose: Alternating 2.5 mg and 5 mg every other day   INR down to 2.2 today (therapeutic)   Goal of Therapy:  INR 2-3 Monitor platelets by anticoagulation protocol: Yes   Plan:  Warfarin 2.5 mg PO x1 today Daily  INR  Albertina Parr, PharmD., BCPS Clinical Pharmacist Clinical phone for 12/16/18 until 3:30pm: [phone]

## 2018-12-16 NOTE — Progress Notes (Signed)
0800: Assumed care of Pt from night RN. Pt alert, oriented, denies pain. VS WNL. Helped reposition, peri care complete. Call bell within reach  0900: Assisted PT with getting Pt OOB for breakfast. Pt tolerated well. Call bell and chair alarm in place, will monitor  1145: Both PNA and Flu vaccines given. Report given to [facility] at [phone]. Pt discharged from facility via EMS.

## 2018-12-16 NOTE — TOC Progression Note (Signed)
Transition of Care [facility]) - Progression Note    Patient Details  Name: Rebecca [name] MRN: [id number] Date of Birth: [birth year]  [facility] CM/SW Contact  Loletha Grayer Beverely Pace, RN Phone Number: 12/16/2018, 10:05 AM  Clinical Narrative:  Case manager received authorization for patient for [facility].  Auth# [id number], good until 11/13. Reviewer is Edwena Felty, Fax:# [phone].  Patient will go to Room 801B, at [facility].      Expected Discharge Plan: [place] Barriers to Discharge: Insurance Authorization  Expected Discharge Plan and Services Expected Discharge Plan: [place]   Discharge Planning Services: CM Consult Post Acute Care Choice: NA Living arrangements for the past 2 months: Single Family Home Expected Discharge Date: 12/15/18               DME Arranged: N/A         HH Arranged: NA HH Agency: NA         Social Determinants of Health (SDOH) Interventions    Readmission Risk Interventions No flowsheet data found.

## 2018-12-23 ENCOUNTER — Telehealth: Payer: Self-pay | Admitting: *Deleted

## 2018-12-23 NOTE — Telephone Encounter (Signed)
CareAlert received for persistent AF since 12/11/18. Rate controlled. On warfarin per med list. Hospitalized 11/3-11/11 due to COVID-19.   Spoke with patient. She reports she returned home from rehab yesterday. Symptoms continue to improve and she reports she feels much better than she did last week. No chest pain or palpitations, SOB improving. Compliant with cardiac medications now that she is home, though she is not sure what she received for medications while hospitalized. Encouraged continued compliance with medications, including warfarin. Advised pt I will review transmission with Dr. Lovena Le and call her back. Pt in agreement with plan.  Spoke with Dr. Lovena Le, no changes recommended at this time, will reassess rhythm as of 01/19/19 scheduled transmission.  Spoke with pt and granddaughter, [name], to update them about plan. They are in agreement with continuing to monitor for now. Requesting to reschedule appointment from 11/11 with B. Strader, PA,  that was canceled due to hospitalization. Message re-routed to [facility] triage pool for advisement.

## 2018-12-23 NOTE — Telephone Encounter (Signed)
Will forward to front office to contact pt to reschedule appointment with Bernerd Pho.

## 2019-01-01 ENCOUNTER — Inpatient Hospital Stay (HOSPITAL_COMMUNITY)
Admission: EM | Admit: 2019-01-01 | Discharge: 2019-01-07 | DRG: 291 | Disposition: A | Discharge status: Home / Self Care | Payer: Medicare Other | Attending: Family Medicine | Admitting: Family Medicine

## 2019-01-01 ENCOUNTER — Emergency Department (HOSPITAL_COMMUNITY): Payer: Medicare Other

## 2019-01-01 ENCOUNTER — Other Ambulatory Visit: Payer: Self-pay

## 2019-01-01 ENCOUNTER — Encounter (HOSPITAL_COMMUNITY): Payer: Self-pay | Admitting: Emergency Medicine

## 2019-01-01 ENCOUNTER — Inpatient Hospital Stay (HOSPITAL_COMMUNITY): Payer: Medicare Other

## 2019-01-01 DIAGNOSIS — N1831 Chronic kidney disease, stage 3a: Secondary | ICD-10-CM | POA: Diagnosis present

## 2019-01-01 DIAGNOSIS — G47411 Narcolepsy with cataplexy: Secondary | ICD-10-CM | POA: Diagnosis present

## 2019-01-01 DIAGNOSIS — Z950 Presence of cardiac pacemaker: Secondary | ICD-10-CM | POA: Diagnosis not present

## 2019-01-01 DIAGNOSIS — Z86718 Personal history of other venous thrombosis and embolism: Secondary | ICD-10-CM | POA: Diagnosis not present

## 2019-01-01 DIAGNOSIS — I5033 Acute on chronic diastolic (congestive) heart failure: Secondary | ICD-10-CM | POA: Diagnosis present

## 2019-01-01 DIAGNOSIS — Z6835 Body mass index (BMI) 35.0-35.9, adult: Secondary | ICD-10-CM | POA: Diagnosis not present

## 2019-01-01 DIAGNOSIS — Z515 Encounter for palliative care: Secondary | ICD-10-CM | POA: Diagnosis not present

## 2019-01-01 DIAGNOSIS — I10 Essential (primary) hypertension: Secondary | ICD-10-CM

## 2019-01-01 DIAGNOSIS — K449 Diaphragmatic hernia without obstruction or gangrene: Secondary | ICD-10-CM | POA: Diagnosis present

## 2019-01-01 DIAGNOSIS — E1121 Type 2 diabetes mellitus with diabetic nephropathy: Secondary | ICD-10-CM | POA: Diagnosis not present

## 2019-01-01 DIAGNOSIS — Z885 Allergy status to narcotic agent status: Secondary | ICD-10-CM | POA: Diagnosis not present

## 2019-01-01 DIAGNOSIS — M170 Bilateral primary osteoarthritis of knee: Secondary | ICD-10-CM | POA: Diagnosis present

## 2019-01-01 DIAGNOSIS — Z7189 Other specified counseling: Secondary | ICD-10-CM | POA: Diagnosis not present

## 2019-01-01 DIAGNOSIS — J189 Pneumonia, unspecified organism: Secondary | ICD-10-CM

## 2019-01-01 DIAGNOSIS — I480 Paroxysmal atrial fibrillation: Secondary | ICD-10-CM | POA: Diagnosis present

## 2019-01-01 DIAGNOSIS — Z96643 Presence of artificial hip joint, bilateral: Secondary | ICD-10-CM | POA: Diagnosis present

## 2019-01-01 DIAGNOSIS — J9621 Acute and chronic respiratory failure with hypoxia: Secondary | ICD-10-CM

## 2019-01-01 DIAGNOSIS — I129 Hypertensive chronic kidney disease with stage 1 through stage 4 chronic kidney disease, or unspecified chronic kidney disease: Secondary | ICD-10-CM | POA: Diagnosis not present

## 2019-01-01 DIAGNOSIS — R0902 Hypoxemia: Secondary | ICD-10-CM

## 2019-01-01 DIAGNOSIS — E1165 Type 2 diabetes mellitus with hyperglycemia: Secondary | ICD-10-CM

## 2019-01-01 DIAGNOSIS — Z8249 Family history of ischemic heart disease and other diseases of the circulatory system: Secondary | ICD-10-CM | POA: Diagnosis not present

## 2019-01-01 DIAGNOSIS — I080 Rheumatic disorders of both mitral and aortic valves: Secondary | ICD-10-CM | POA: Diagnosis present

## 2019-01-01 DIAGNOSIS — F329 Major depressive disorder, single episode, unspecified: Secondary | ICD-10-CM | POA: Diagnosis present

## 2019-01-01 DIAGNOSIS — Z833 Family history of diabetes mellitus: Secondary | ICD-10-CM

## 2019-01-01 DIAGNOSIS — Z87891 Personal history of nicotine dependence: Secondary | ICD-10-CM

## 2019-01-01 DIAGNOSIS — Z7901 Long term (current) use of anticoagulants: Secondary | ICD-10-CM

## 2019-01-01 DIAGNOSIS — Z880 Allergy status to penicillin: Secondary | ICD-10-CM

## 2019-01-01 DIAGNOSIS — K219 Gastro-esophageal reflux disease without esophagitis: Secondary | ICD-10-CM

## 2019-01-01 DIAGNOSIS — E1122 Type 2 diabetes mellitus with diabetic chronic kidney disease: Secondary | ICD-10-CM | POA: Diagnosis present

## 2019-01-01 DIAGNOSIS — K760 Fatty (change of) liver, not elsewhere classified: Secondary | ICD-10-CM | POA: Diagnosis present

## 2019-01-01 DIAGNOSIS — G47421 Narcolepsy in conditions classified elsewhere with cataplexy: Secondary | ICD-10-CM | POA: Diagnosis not present

## 2019-01-01 DIAGNOSIS — B948 Sequelae of other specified infectious and parasitic diseases: Secondary | ICD-10-CM | POA: Diagnosis not present

## 2019-01-01 DIAGNOSIS — R0602 Shortness of breath: Secondary | ICD-10-CM | POA: Diagnosis present

## 2019-01-01 DIAGNOSIS — E6609 Other obesity due to excess calories: Secondary | ICD-10-CM | POA: Diagnosis not present

## 2019-01-01 DIAGNOSIS — Z794 Long term (current) use of insulin: Secondary | ICD-10-CM | POA: Diagnosis not present

## 2019-01-01 DIAGNOSIS — I130 Hypertensive heart and chronic kidney disease with heart failure and stage 1 through stage 4 chronic kidney disease, or unspecified chronic kidney disease: Secondary | ICD-10-CM | POA: Diagnosis present

## 2019-01-01 DIAGNOSIS — F419 Anxiety disorder, unspecified: Secondary | ICD-10-CM | POA: Diagnosis present

## 2019-01-01 DIAGNOSIS — G629 Polyneuropathy, unspecified: Secondary | ICD-10-CM | POA: Diagnosis present

## 2019-01-01 DIAGNOSIS — Z823 Family history of stroke: Secondary | ICD-10-CM | POA: Diagnosis not present

## 2019-01-01 DIAGNOSIS — Z9981 Dependence on supplemental oxygen: Secondary | ICD-10-CM

## 2019-01-01 DIAGNOSIS — Z888 Allergy status to other drugs, medicaments and biological substances status: Secondary | ICD-10-CM

## 2019-01-01 DIAGNOSIS — N183 Chronic kidney disease, stage 3 unspecified: Secondary | ICD-10-CM | POA: Diagnosis not present

## 2019-01-01 DIAGNOSIS — J849 Interstitial pulmonary disease, unspecified: Secondary | ICD-10-CM | POA: Diagnosis present

## 2019-01-01 DIAGNOSIS — E1322 Other specified diabetes mellitus with diabetic chronic kidney disease: Secondary | ICD-10-CM | POA: Diagnosis not present

## 2019-01-01 LAB — CBC WITH DIFFERENTIAL/PLATELET
Abs Immature Granulocytes: 0.02 10*3/uL (ref 0.00–0.07)
Basophils Absolute: 0 10*3/uL (ref 0.0–0.1)
Basophils Relative: 1 %
Eosinophils Absolute: 0.2 10*3/uL (ref 0.0–0.5)
Eosinophils Relative: 3 %
HCT: 37.2 % (ref 36.0–46.0)
Hemoglobin: 11.5 g/dL — ABNORMAL LOW (ref 12.0–15.0)
Immature Granulocytes: 0 %
Lymphocytes Relative: 12 %
Lymphs Abs: 1.1 10*3/uL (ref 0.7–4.0)
MCH: 30 pg (ref 26.0–34.0)
MCHC: 30.9 g/dL (ref 30.0–36.0)
MCV: 97.1 fL (ref 80.0–100.0)
Monocytes Absolute: 0.6 10*3/uL (ref 0.1–1.0)
Monocytes Relative: 6 %
Neutro Abs: 6.9 10*3/uL (ref 1.7–7.7)
Neutrophils Relative %: 78 %
Platelets: 292 10*3/uL (ref 150–400)
RBC: 3.83 MIL/uL — ABNORMAL LOW (ref 3.87–5.11)
RDW: 15.6 % — ABNORMAL HIGH (ref 11.5–15.5)
WBC: 8.8 10*3/uL (ref 4.0–10.5)
nRBC: 0 % (ref 0.0–0.2)

## 2019-01-01 LAB — CBG MONITORING, ED
Glucose-Capillary: 181 mg/dL — ABNORMAL HIGH (ref 70–99)
Glucose-Capillary: 226 mg/dL — ABNORMAL HIGH (ref 70–99)

## 2019-01-01 LAB — COMPREHENSIVE METABOLIC PANEL
ALT: 26 U/L (ref 0–44)
AST: 24 U/L (ref 15–41)
Albumin: 3.1 g/dL — ABNORMAL LOW (ref 3.5–5.0)
Alkaline Phosphatase: 58 U/L (ref 38–126)
Anion gap: 11 (ref 5–15)
BUN: 20 mg/dL (ref 8–23)
CO2: 29 mmol/L (ref 22–32)
Calcium: 8.8 mg/dL — ABNORMAL LOW (ref 8.9–10.3)
Chloride: 99 mmol/L (ref 98–111)
Creatinine, Ser: 1.09 mg/dL — ABNORMAL HIGH (ref 0.44–1.00)
GFR calc Af Amer: 56 mL/min — ABNORMAL LOW (ref >60)
GFR calc non Af Amer: 48 mL/min — ABNORMAL LOW (ref >60)
Glucose, Bld: 219 mg/dL — ABNORMAL HIGH (ref 70–99)
Potassium: 4.3 mmol/L (ref 3.5–5.1)
Sodium: 139 mmol/L (ref 135–145)
Total Bilirubin: 0.7 mg/dL (ref 0.3–1.2)
Total Protein: 7.5 g/dL (ref 6.5–8.1)

## 2019-01-01 LAB — FIBRINOGEN: Fibrinogen: 564 mg/dL — ABNORMAL HIGH (ref 210–475)

## 2019-01-01 LAB — FERRITIN: Ferritin: 195 ng/mL (ref 11–307)

## 2019-01-01 LAB — HEMOGLOBIN A1C
Hgb A1c MFr Bld: 8.2 % — ABNORMAL HIGH (ref 4.8–5.6)
Mean Plasma Glucose: 188.64 mg/dL

## 2019-01-01 LAB — LACTIC ACID, PLASMA
Lactic Acid, Venous: 1.5 mmol/L (ref 0.5–1.9)
Lactic Acid, Venous: 1.2 mmol/L (ref 0.5–1.9)

## 2019-01-01 LAB — LACTATE DEHYDROGENASE: LDH: 199 U/L — ABNORMAL HIGH (ref 98–192)

## 2019-01-01 LAB — C-REACTIVE PROTEIN: CRP: 6.5 mg/dL — ABNORMAL HIGH (ref <1.0)

## 2019-01-01 LAB — MRSA PCR SCREENING: MRSA by PCR: NEGATIVE

## 2019-01-01 LAB — BRAIN NATRIURETIC PEPTIDE: B Natriuretic Peptide: 445 pg/mL — ABNORMAL HIGH (ref 0.0–100.0)

## 2019-01-01 LAB — INFLUENZA PANEL BY PCR (TYPE A & B)
Influenza A By PCR: NEGATIVE
Influenza B By PCR: NEGATIVE

## 2019-01-01 LAB — TROPONIN I (HIGH SENSITIVITY)
Troponin I (High Sensitivity): 12 ng/L (ref <18)
Troponin I (High Sensitivity): 13 ng/L (ref <18)

## 2019-01-01 LAB — POC SARS CORONAVIRUS 2 AG -  ED: SARS Coronavirus 2 Ag: NEGATIVE

## 2019-01-01 LAB — SARS CORONAVIRUS 2 BY RT PCR (HOSPITAL ORDER, PERFORMED IN ~~LOC~~ HOSPITAL LAB): SARS Coronavirus 2: POSITIVE — AB

## 2019-01-01 LAB — GLUCOSE, CAPILLARY
Glucose-Capillary: 210 mg/dL — ABNORMAL HIGH (ref 70–99)
Glucose-Capillary: 214 mg/dL — ABNORMAL HIGH (ref 70–99)

## 2019-01-01 LAB — ABO/RH: ABO/RH(D): A POS

## 2019-01-01 LAB — PROTIME-INR
INR: 2.6 — ABNORMAL HIGH (ref 0.8–1.2)
Prothrombin Time: 27.7 seconds — ABNORMAL HIGH (ref 11.4–15.2)

## 2019-01-01 LAB — PROCALCITONIN: Procalcitonin: <0.1 ng/mL

## 2019-01-01 LAB — D-DIMER, QUANTITATIVE: D-Dimer, Quant: 1.68 ug/mL-FEU — ABNORMAL HIGH (ref 0.00–0.50)

## 2019-01-01 LAB — HIV ANTIBODY (ROUTINE TESTING W REFLEX): HIV Screen 4th Generation wRfx: NONREACTIVE

## 2019-01-01 MED ORDER — CLOMIPRAMINE HCL 25 MG PO CAPS
25.0000 mg | ORAL_CAPSULE | Freq: Four times a day (QID) | ORAL | Status: DC
  Administered 2019-01-01 – 2019-01-07 (×23): 25 mg via ORAL
  Filled 2019-01-01 (×31): qty 1

## 2019-01-01 MED ORDER — QUETIAPINE FUMARATE 100 MG PO TABS
100.0000 mg | ORAL_TABLET | Freq: Every day | ORAL | Status: DC
  Administered 2019-01-01 – 2019-01-06 (×6): 100 mg via ORAL
  Filled 2019-01-01 (×6): qty 1

## 2019-01-01 MED ORDER — INSULIN GLARGINE 100 UNIT/ML ~~LOC~~ SOLN
12.0000 [IU] | Freq: Every day | SUBCUTANEOUS | Status: DC
  Administered 2019-01-01 – 2019-01-02 (×2): 12 [IU] via SUBCUTANEOUS
  Filled 2019-01-01 (×5): qty 0.12

## 2019-01-01 MED ORDER — SODIUM CHLORIDE 0.9% FLUSH
3.0000 mL | Freq: Two times a day (BID) | INTRAVENOUS | Status: DC
  Administered 2019-01-01 – 2019-01-07 (×12): 3 mL via INTRAVENOUS

## 2019-01-01 MED ORDER — CARVEDILOL 3.125 MG PO TABS
3.1250 mg | ORAL_TABLET | Freq: Two times a day (BID) | ORAL | Status: DC
  Administered 2019-01-01 – 2019-01-07 (×13): 3.125 mg via ORAL
  Filled 2019-01-01 (×18): qty 1

## 2019-01-01 MED ORDER — VANCOMYCIN HCL IN DEXTROSE 1-5 GM/200ML-% IV SOLN
1000.0000 mg | INTRAVENOUS | Status: DC
  Filled 2019-01-01: qty 200

## 2019-01-01 MED ORDER — DULOXETINE HCL 60 MG PO CPEP
60.0000 mg | ORAL_CAPSULE | Freq: Every day | ORAL | Status: DC
  Administered 2019-01-01 – 2019-01-07 (×7): 60 mg via ORAL
  Filled 2019-01-01 (×6): qty 1
  Filled 2019-01-01: qty 2

## 2019-01-01 MED ORDER — ADULT MULTIVITAMIN W/MINERALS CH
1.0000 | ORAL_TABLET | Freq: Every day | ORAL | Status: DC
  Administered 2019-01-01 – 2019-01-07 (×7): 1 via ORAL
  Filled 2019-01-01 (×7): qty 1

## 2019-01-01 MED ORDER — FUROSEMIDE 10 MG/ML IJ SOLN
40.0000 mg | Freq: Two times a day (BID) | INTRAMUSCULAR | Status: DC
  Administered 2019-01-01 – 2019-01-07 (×12): 40 mg via INTRAVENOUS
  Filled 2019-01-01 (×12): qty 4

## 2019-01-01 MED ORDER — CHLORHEXIDINE GLUCONATE CLOTH 2 % EX PADS
6.0000 | MEDICATED_PAD | Freq: Every day | CUTANEOUS | Status: DC
  Administered 2019-01-02 – 2019-01-07 (×6): 6 via TOPICAL

## 2019-01-01 MED ORDER — WARFARIN SODIUM 2.5 MG PO TABS
2.5000 mg | ORAL_TABLET | Freq: Once | ORAL | Status: AC
  Administered 2019-01-01: 18:00:00 2.5 mg via ORAL
  Filled 2019-01-01 (×2): qty 1

## 2019-01-01 MED ORDER — ZINC SULFATE 220 (50 ZN) MG PO CAPS
220.0000 mg | ORAL_CAPSULE | Freq: Every day | ORAL | Status: DC
  Administered 2019-01-01 – 2019-01-07 (×7): 220 mg via ORAL
  Filled 2019-01-01 (×10): qty 1

## 2019-01-01 MED ORDER — INSULIN GLARGINE 100 UNIT/ML ~~LOC~~ SOLN: 10.0000 [IU] | Freq: Every day | SUBCUTANEOUS | Status: DC

## 2019-01-01 MED ORDER — GABAPENTIN 300 MG PO CAPS
300.0000 mg | ORAL_CAPSULE | Freq: Two times a day (BID) | ORAL | Status: DC
  Administered 2019-01-01 – 2019-01-07 (×13): 300 mg via ORAL
  Filled 2019-01-01 (×13): qty 1

## 2019-01-01 MED ORDER — WARFARIN - PHARMACIST DOSING INPATIENT
Freq: Every day | Status: DC
  Administered 2019-01-01 – 2019-01-06 (×4)

## 2019-01-01 MED ORDER — VITAMIN C 500 MG PO TABS
500.0000 mg | ORAL_TABLET | Freq: Every day | ORAL | Status: DC
  Administered 2019-01-01 – 2019-01-07 (×7): 500 mg via ORAL
  Filled 2019-01-01 (×10): qty 1

## 2019-01-01 MED ORDER — MIRTAZAPINE 15 MG PO TABS
7.5000 mg | ORAL_TABLET | Freq: Every day | ORAL | Status: DC
  Administered 2019-01-01 – 2019-01-06 (×6): 7.5 mg via ORAL
  Filled 2019-01-01 (×6): qty 1

## 2019-01-01 MED ORDER — INSULIN ASPART 100 UNIT/ML ~~LOC~~ SOLN
0.0000 [IU] | Freq: Every day | SUBCUTANEOUS | Status: DC
  Administered 2019-01-01 – 2019-01-02 (×2): 2 [IU] via SUBCUTANEOUS
  Administered 2019-01-03: 3 [IU] via SUBCUTANEOUS
  Administered 2019-01-04: 5 [IU] via SUBCUTANEOUS
  Administered 2019-01-05: 3 [IU] via SUBCUTANEOUS
  Administered 2019-01-06: 4 [IU] via SUBCUTANEOUS

## 2019-01-01 MED ORDER — POTASSIUM CHLORIDE ER 10 MEQ PO TBCR
10.0000 meq | EXTENDED_RELEASE_TABLET | Freq: Every day | ORAL | Status: DC
  Administered 2019-01-01 – 2019-01-07 (×7): 10 meq via ORAL
  Filled 2019-01-01 (×14): qty 1

## 2019-01-01 MED ORDER — FUROSEMIDE 40 MG PO TABS
40.0000 mg | ORAL_TABLET | Freq: Two times a day (BID) | ORAL | Status: DC
  Administered 2019-01-01: 09:00:00 40 mg via ORAL
  Filled 2019-01-01: qty 1

## 2019-01-01 MED ORDER — SODIUM CHLORIDE 0.9 % IV SOLN
250.0000 mL | INTRAVENOUS | Status: DC | PRN
  Administered 2019-01-01: 250 mL via INTRAVENOUS

## 2019-01-01 MED ORDER — AMLODIPINE BESYLATE 5 MG PO TABS
10.0000 mg | ORAL_TABLET | Freq: Every day | ORAL | Status: DC
  Administered 2019-01-01 – 2019-01-07 (×7): 10 mg via ORAL
  Filled 2019-01-01 (×7): qty 2

## 2019-01-01 MED ORDER — CLONAZEPAM 0.5 MG PO TABS
1.0000 mg | ORAL_TABLET | Freq: Every day | ORAL | Status: DC
  Administered 2019-01-01 – 2019-01-06 (×6): 1 mg via ORAL
  Filled 2019-01-01 (×6): qty 2

## 2019-01-01 MED ORDER — SODIUM CHLORIDE 0.9% FLUSH: 3.0000 mL | INTRAVENOUS | Status: DC | PRN

## 2019-01-01 MED ORDER — VANCOMYCIN HCL IN DEXTROSE 1-5 GM/200ML-% IV SOLN
1000.0000 mg | INTRAVENOUS | Status: AC
  Administered 2019-01-01 (×2): 1000 mg via INTRAVENOUS
  Filled 2019-01-01: qty 200

## 2019-01-01 MED ORDER — SODIUM CHLORIDE 0.9 % IV SOLN
2.0000 g | Freq: Once | INTRAVENOUS | Status: AC
  Administered 2019-01-01: 2 g via INTRAVENOUS
  Filled 2019-01-01: qty 2

## 2019-01-01 MED ORDER — INSULIN ASPART 100 UNIT/ML ~~LOC~~ SOLN
0.0000 [IU] | Freq: Three times a day (TID) | SUBCUTANEOUS | Status: DC
  Administered 2019-01-01 (×2): 5 [IU] via SUBCUTANEOUS
  Administered 2019-01-01: 09:00:00 3 [IU] via SUBCUTANEOUS
  Administered 2019-01-02: 12:00:00 5 [IU] via SUBCUTANEOUS
  Administered 2019-01-02 (×2): 3 [IU] via SUBCUTANEOUS
  Administered 2019-01-03: 8 [IU] via SUBCUTANEOUS
  Administered 2019-01-03: 5 [IU] via SUBCUTANEOUS
  Administered 2019-01-03: 3 [IU] via SUBCUTANEOUS
  Administered 2019-01-04: 18:00:00 8 [IU] via SUBCUTANEOUS
  Administered 2019-01-04: 12:00:00 5 [IU] via SUBCUTANEOUS
  Administered 2019-01-04: 3 [IU] via SUBCUTANEOUS
  Administered 2019-01-05: 10:00:00 5 [IU] via SUBCUTANEOUS
  Administered 2019-01-05: 8 [IU] via SUBCUTANEOUS
  Administered 2019-01-05: 18:00:00 11 [IU] via SUBCUTANEOUS
  Administered 2019-01-06: 18:00:00 8 [IU] via SUBCUTANEOUS
  Administered 2019-01-06: 13:00:00 5 [IU] via SUBCUTANEOUS
  Administered 2019-01-06: 09:00:00 2 [IU] via SUBCUTANEOUS
  Administered 2019-01-07: 5 [IU] via SUBCUTANEOUS
  Filled 2019-01-01 (×2): qty 1

## 2019-01-01 MED ORDER — FUROSEMIDE 10 MG/ML IJ SOLN
40.0000 mg | Freq: Once | INTRAMUSCULAR | Status: AC
  Administered 2019-01-01: 11:00:00 40 mg via INTRAVENOUS
  Filled 2019-01-01: qty 4

## 2019-01-01 NOTE — ED Provider Notes (Signed)
[facility] EMERGENCY DEPARTMENT Provider Note   CSN: [id number] Arrival date & time: 01/01/19  0454     History   Chief Complaint Chief Complaint  Patient presents with  . Shortness of Breath    HPI Rebecca [name] is a 78 y.o. female.     Patient presents with worsening shortness of breath since yesterday.  Patient was positive for Covid 5 weeks prior and had negative test 2 weeks ago.  Patient denies fever however has had a cough shortness of breath.  No significant sick contacts.  Patient on nonrebreather on arrival.     Past Medical History:  Diagnosis Date  . Aortic stenosis   . Cataplexy   . Diastolic heart failure ([place])   . Essential hypertension   . GERD (gastroesophageal reflux disease)   . Hiatal hernia   . History of recurrent deep vein thrombosis (DVT)   . Mitral stenosis   . Narcolepsy   . Neuropathy   . Renal disorder   . Type 2 diabetes mellitus [facility])     Patient Active Problem List   Diagnosis Date Noted  . Paroxysmal atrial fibrillation ([place]) 12/09/2018  . Acute on chronic respiratory failure ([place]) 12/09/2018  . Pneumonia due to COVID-19 virus 12/08/2018  . History of recurrent deep vein thrombosis (DVT)   . Type 2 diabetes mellitus ([place])   . Lobar pneumonia ([place]) 09/05/2018  . Acute on chronic diastolic CHF (congestive heart failure) ([place]) 09/05/2018  . CHF (congestive heart failure) ([place]) 09/04/2018  . HCAP (healthcare-associated pneumonia) 03/29/2017  . Cellulitis 03/03/2017  . Type 2 diabetes mellitus with other specified complication ([place]) 08/65/7846  . CKD (chronic kidney disease), stage III 03/03/2017  . Status post placement of cardiac pacemaker 10/29/2016  . Pulmonary edema 10/23/2016  . Acute respiratory failure with hypoxia ([place]) 02/22/2015  . CAP (community acquired pneumonia) 02/22/2015  . Fall at home 09/21/2014  . Mild aortic stenosis 09/08/2014  . Mild to moderate  mitral stenosis 09/08/2014  . Chronic  anticoagulation-Coumadin 09/08/2014  . Sepsis ([place]) 09/05/2014  . Elevated troponin 09/05/2014  . Lower leg DVT (deep venous thromboembolism), chronic ([place]) 09/05/2014  . Essential hypertension 09/05/2014  . Depression 09/05/2014  . Poorly controlled diabetes mellitus ([place]) 09/05/2014  . Hypomagnesemia 09/05/2014  . Hyponatremia 09/05/2014  . Pyelonephritis 09/05/2014  . Chronic diastolic CHF (congestive heart failure) ([place])   . 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 GERD (gastroesophageal reflux disease) 07/08/2011  . Gastroparesis 07/08/2011  . IDA (iron deficiency anemia) 07/08/2011  . History of colonic polyps 07/08/2011  . Narcolepsy-evaluated at [facility] 07/08/2011  . NAFLD (nonalcoholic fatty liver disease) 07/08/2011  . Obesity 07/08/2011    Past Surgical History:  Procedure Laterality Date  . ABDOMINAL HYSTERECTOMY    . CARPAL TUNNEL RELEASE    . CARPECTOMY  12/26/2011   Procedure: CARPECTOMY;  Surgeon: Cammie Sickle., MD;  Location: [facility];  Service: Orthopedics;  Laterality: Right;  PROXIMAL ROW CARPECTOMY RIGHT WRIST and neurectomy  . CHOLECYSTECTOMY    . HEMORROIDECTOMY    . KIDNEY SURGERY    . NEPHROSTOMY    . PACEMAKER IMPLANT N/A 10/25/2016   Procedure: Pacemaker Implant;  Surgeon: Constance Haw, MD;  Location: [facility];  Service: Cardiovascular;  Laterality: N/A;  . TOTAL HIP ARTHROPLASTY     bilateral     OB History   No obstetric history on file.      Home Medications  Prior to Admission medications   Medication Sig Start Date End Date Taking? Authorizing Provider  acetaminophen (TYLENOL) 500 MG tablet Take 1,000 mg by mouth 2 (two) times daily.    [provider]  amLODipine (NORVASC) 10 MG tablet Take 1 tablet (10 mg total) by mouth daily. 12/16/18 12/16/19  Leroy Sea, MD  BYDUREON 2 MG PEN Inject 2 mg into the skin once a week. 01/03/16   [provider]   carvedilol (COREG) 3.125 MG tablet TAKE 1 TABLET BY MOUTH TWICE DAILY WITH A MEAL Patient taking differently: Take 3.125 mg by mouth 2 (two) times daily with a meal.  09/09/18   Laqueta Linden, MD  Cholecalciferol (VITAMIN D3) 3000 UNITS TABS Take 1 capsule by mouth daily.    [provider]  clobetasol cream (TEMOVATE) 0.05 % Apply 1 application topically daily.  11/11/18   [provider]  clomiPRAMINE (ANAFRANIL) 25 MG capsule Take 25 mg by mouth 4 (four) times daily.     [provider]  clonazePAM (KLONOPIN) 1 MG tablet Take 1 tablet (1 mg total) by mouth at bedtime. 12/15/18   Leroy Sea, MD  DULoxetine (CYMBALTA) 60 MG capsule Take 60 mg by mouth daily.      [provider]  ferrous sulfate 325 (65 FE) MG tablet Take 325 mg by mouth 2 (two) times daily with a meal.     [provider]  furosemide (LASIX) 40 MG tablet Take 20 mg by mouth 2 (two) times daily.  12/01/14   [provider]  gabapentin (NEURONTIN) 300 MG capsule Take 300 mg by mouth 2 (two) times daily.    [provider]  insulin glargine (LANTUS) 100 unit/mL SOPN Inject 24 Units into the skin daily.     [provider]  metFORMIN (GLUCOPHAGE) 500 MG tablet Take 500 mg by mouth 2 (two) times daily with a meal.    [provider]  methylPREDNISolone (MEDROL DOSEPAK) 4 MG TBPK tablet follow package directions 12/16/18   Leroy Sea, MD  mirtazapine (REMERON) 7.5 MG tablet Take 1 tablet (7.5 mg total) by mouth at bedtime. 12/15/18   Leroy Sea, MD  Multiple Vitamin (MULTIVITAMIN WITH MINERALS) TABS tablet Take 1 tablet by mouth daily.    [provider]  potassium chloride (K-DUR) 10 MEQ tablet Take 1 tablet (10 mEq total) by mouth daily. Patient taking differently: Take 10 mEq by mouth once.  09/21/14   Dyann Kief, PA-C  QUEtiapine (SEROQUEL) 100 MG tablet Take 100 mg by mouth at bedtime.    [provider]   vitamin C (ASCORBIC ACID) 500 MG tablet Take 500 mg by mouth daily.    [provider]  warfarin (COUMADIN) 5 MG tablet Take 2.5-5 mg by mouth every evening. Starting 10/29/2016 take 2.5 mg alternating with 5 mg daily     [provider]    Family History Family History  Problem Relation Age of Onset  . Heart disease Father   . Diabetes Other   . Stroke Mother   . Scleroderma Brother     Social History Social History   Tobacco Use  . Smoking status: Former Smoker    Quit date: 10/13/1988    Years since quitting: 30.2  . Smokeless tobacco: Never Used  Substance Use Topics  . Alcohol use: No    Alcohol/week: 0.0 standard drinks  . Drug use: No     Allergies   Codeine, Compazine, Other,  Penicillins, Morphine and related, and Demerol [meperidine]   Review of Systems Review of Systems  Constitutional: Negative for chills and fever.  HENT: Negative for congestion.   Eyes: Negative for visual disturbance.  Respiratory: Positive for cough and shortness of breath.   Cardiovascular: Negative for chest pain.  Gastrointestinal: Negative for abdominal pain and vomiting.  Genitourinary: Negative for dysuria and flank pain.  Musculoskeletal: Negative for back pain, neck pain and neck stiffness.  Skin: Negative for rash.  Neurological: Negative for light-headedness and headaches.     Physical Exam Updated Vital Signs BP (!) 155/54   Pulse (!) 58   Resp 20   SpO2 100%   Physical Exam Vitals signs and nursing note reviewed.  Constitutional:      Appearance: She is well-developed.  HENT:     Head: Normocephalic and atraumatic.  Eyes:     General:        Right eye: No discharge.        Left eye: No discharge.     Conjunctiva/sclera: Conjunctivae normal.  Neck:     Musculoskeletal: Normal range of motion and neck supple.     Trachea: No tracheal deviation.  Cardiovascular:     Rate and Rhythm: Normal rate and regular rhythm.  Pulmonary:     Effort:  Tachypnea present.     Breath sounds: Examination of the right-lower field reveals rales. Examination of the left-lower field reveals rales. Rales present.  Abdominal:     General: There is no distension.     Palpations: Abdomen is soft.     Tenderness: There is no abdominal tenderness. There is no guarding.  Musculoskeletal:     Right lower leg: No edema.     Left lower leg: No edema.  Skin:    General: Skin is warm.     Findings: No rash.  Neurological:     General: No focal deficit present.     Mental Status: She is alert and oriented to person, place, and time.      ED Treatments / Results  Labs (all labs ordered are listed, but only abnormal results are displayed) Labs Reviewed  COMPREHENSIVE METABOLIC PANEL - Abnormal; Notable for the following components:      Result Value   Glucose, Bld 219 (*)    Creatinine, Ser 1.09 (*)    Calcium 8.8 (*)    Albumin 3.1 (*)    GFR calc non Af Amer 48 (*)    GFR calc Af Amer 56 (*)    All other components within normal limits  CBC WITH DIFFERENTIAL/PLATELET - Abnormal; Notable for the following components:   RBC 3.83 (*)    Hemoglobin 11.5 (*)    RDW 15.6 (*)    All other components within normal limits  BRAIN NATRIURETIC PEPTIDE - Abnormal; Notable for the following components:   B Natriuretic Peptide 445.0 (*)    All other components within normal limits  CULTURE, BLOOD (ROUTINE X 2)  CULTURE, BLOOD (ROUTINE X 2)  LACTIC ACID, PLASMA  LACTIC ACID, PLASMA  POC SARS CORONAVIRUS 2 AG -  ED  TROPONIN I (HIGH SENSITIVITY)  TROPONIN I (HIGH SENSITIVITY)    EKG None  Radiology Dg Chest Portable 1 View  Result Date: 01/01/2019 CLINICAL DATA:  Shortness of breath, COVID-19 positive 5 weeks prior EXAM: PORTABLE CHEST 1 VIEW COMPARISON:  Radiograph 12/08/2018, CT 09/04/2018 FINDINGS: Multifocal areas of airspace opacity throughout the lungs. Cardiomegaly, increased from comparison. Dense mitral annular calcification is seen.  Pacer pack overlies the left chest wall with leads at the apex and right atrium. No pneumothorax. Probable left effusion. No acute osseous or soft tissue abnormality. IMPRESSION: 1. Multifocal areas of airspace opacity throughout the lungs, suspicious for multifocal pneumonia. 2. Cardiomegaly, increased from comparison. Electronically Signed   By: Kreg ShropshirePrice  DeHay M.D.   On: 01/01/2019 06:10    Procedures .Critical Care Performed by: Blane OharaZavitz, Dardan Shelton, MD Authorized by: Blane OharaZavitz, Jarred Purtee, MD   Critical care provider statement:    Critical care time (minutes):  40   Critical care start time:  01/01/2019 6:20 AM   Critical care end time:  01/01/2019 7:00 AM   Critical care time was exclusive of:  Separately billable procedures and treating other patients and teaching time   Critical care was necessary to treat or prevent imminent or life-threatening deterioration of the following conditions:  Respiratory failure   Critical care was time spent personally by me on the following activities:  Discussions with consultants, evaluation of patient's response to treatment, examination of patient, ordering and performing treatments and interventions, ordering and review of laboratory studies, ordering and review of radiographic studies, pulse oximetry, re-evaluation of patient's condition and review of old charts   (including critical care time)  Medications Ordered in ED Medications  aztreonam (AZACTAM) 2 g in sodium chloride 0.9 % 100 mL IVPB (has no administration in time range)  vancomycin (VANCOCIN) IVPB 1000 mg/200 mL premix (has no administration in time range)  vancomycin (VANCOCIN) IVPB 1000 mg/200 mL premix (has no administration in time range)     Initial Impression / Assessment and Plan / ED Course  I have reviewed the triage vital signs and the nursing notes.  Pertinent labs & imaging results that were available during my care of the patient were reviewed by me and considered in my medical  decision making (see chart for details).       Patient with complicated medical history including heart failure, pneumonia, chronic anticoagulation, obesity, pacemaker, diabetes presents with worsening shortness of breath and hypoxia.  Patient had Covid recently.  Point-of-care Covid test negative however poor sensitivity, rapid test will be sent as patient likely will need critical care admission.  Chest x-ray reviewed concerning for possible multifocal pneumonia.  Also in differential would be other pneumonia/viral, heart failure, other.  Blood work reviewed BNP 445, white blood cell count normal.  Plan for IV antibiotics, cultures and admission for further treatment and monitoring.  Spoke with the hospitalist to assist with this transition.  Rebecca [name] was evaluated in Emergency Department on 01/01/2019 for the symptoms described in the history of present illness. She was evaluated in the context of the global COVID-19 pandemic, which necessitated consideration that the patient might be at risk for infection with the SARS-CoV-2 virus that causes COVID-19. Institutional protocols and algorithms that pertain to the evaluation of patients at risk for COVID-19 are in a state of rapid change based on information released by regulatory bodies including the CDC and federal and state organizations. These policies and algorithms were followed during the patient's care in the ED.  The patients results and plan were reviewed and discussed.   Any x-rays performed were independently reviewed by myself.   Differential diagnosis were considered with the presenting HPI.  Medications  aztreonam (AZACTAM) 2 g in sodium chloride 0.9 % 100 mL IVPB (has no administration in time range)  vancomycin (VANCOCIN) IVPB 1000 mg/200 mL premix (has no administration in time range)  vancomycin (VANCOCIN) IVPB  1000 mg/200 mL premix (has no administration in time range)    Vitals:   01/01/19 0600 01/01/19 0630  01/01/19 0645 01/01/19 0700  BP: 124/65 140/72  (!) 155/54  Pulse: (!) 58 61 60 (!) 58  Resp: (!) 21 20 (!) 21 20  SpO2: 95% 100% 100% 100%    Final diagnoses:  HCAP (healthcare-associated pneumonia)  Hypoxia    Admission/ observation were discussed with the admitting physician, patient and/or family and they are comfortable with the plan.    Final Clinical Impressions(s) / ED Diagnoses   Final diagnoses:  HCAP (healthcare-associated pneumonia)  Hypoxia    ED Discharge Orders    None       Blane Ohara, MD 01/01/19 [phone]

## 2019-01-01 NOTE — H&P (Signed)
History and Physical    [name]:[id number] DOB: [birth year] DOA: 01/01/2019  Referring MD/NP/PA: Dr. Jodi Mourning PCP: [name], MD  Patient coming from: Home  Chief Complaint: SOB and general malaise.   HPI: Rebecca [name] is a 78 y.o. female with a past medical history significant for essential hypertension, chronic diastolic heart failure, aortic stenosis, cataplexy, morbid obesity, narcolepsy, history of recurrent DVTs, gastroesophageal reflux disease, type 2 diabetes mellitus with nephropathy, chronic kidney disease a stage IIIa and a recent admission secondary to Covid pneumonia; who presented to the hospital secondary to worsening shortness of breath and nonproductive cough.  Patient also expressed general malaise and increased lower extremity swelling.  She reports that for the last 2 days the breathing compromises to the point that her oxygen supplementation was not enough.  Patient reported sudden chest tightness sensation on the night prior to admission and expressed mild orthopnea.   Patient denies any nausea, vomiting, diarrhea, abdominal pain, fever, chills, chest pain, hematemesis, hematochezia, dysuria and hematuria.  Work-up in the ED demonstrated elevated BNP (even lower than prior levels), oxygen supplementation at the time the EMS picked her up to be in the mid 70s and transiently requiring nonrebreather mask.  Chest x-ray with multifocal infiltrates, vascular congestion and interstitial edema.  Blood work demonstrated normal WBCs, patient is afebrile, elevated fibrinogen of 564, CBG of 226, LDH 199,CRP 6.5, D-dimer 1.68, procalcitonin level < 0.10, repeat Covid test by PCR positive; negative high sensitive troponin X 2.  Blood cultures were taken, IV antibiotics broad-spectrum initiated and [facility] contacted to admit patient for further evaluation and management.  Past Medical/Surgical History: Past Medical History:  Diagnosis Date  . Aortic stenosis   . Cataplexy   .  Diastolic heart failure (HCC)   . Essential hypertension   . GERD (gastroesophageal reflux disease)   . Hiatal hernia   . History of recurrent deep vein thrombosis (DVT)   . Mitral stenosis   . Narcolepsy   . Neuropathy   . Renal disorder   . Type 2 diabetes mellitus (HCC)     Past Surgical History:  Procedure Laterality Date  . ABDOMINAL HYSTERECTOMY    . CARPAL TUNNEL RELEASE    . CARPECTOMY  12/26/2011   Procedure: CARPECTOMY;  Surgeon: Wyn Forster., MD;  Location: [facility];  Service: Orthopedics;  Laterality: Right;  PROXIMAL ROW CARPECTOMY RIGHT WRIST and neurectomy  . CHOLECYSTECTOMY    . HEMORROIDECTOMY    . KIDNEY SURGERY    . NEPHROSTOMY    . PACEMAKER IMPLANT N/A 10/25/2016   Procedure: Pacemaker Implant;  Surgeon: Regan Lemming, MD;  Location: [facility] INVASIVE CV LAB;  Service: Cardiovascular;  Laterality: N/A;  . TOTAL HIP ARTHROPLASTY     bilateral    Social History:  reports that she quit smoking about 30 years ago. She has never used smokeless tobacco. She reports that she does not drink alcohol or use drugs.  Allergies: Allergies  Allergen Reactions  . Codeine Anxiety  . Compazine Anaphylaxis  . Other Anaphylaxis, Rash and Other (See Comments)    Uncoded Allergy. Allergen: INOVAR Uncoded Allergy. Allergen: ALL ADHESIVES Uncoded Allergy. Allergen: SILK SUTURE Uncoded Allergy. Allergen: merthiolate Thermasol  . Penicillins Shortness Of Breath and Rash    Has patient had a PCN reaction causing immediate rash, facial/tongue/throat swelling, SOB or lightheadedness with hypotension: Yes Has patient had a PCN reaction causing severe rash involving mucus membranes or skin necrosis: No Has patient had  a PCN reaction that required hospitalization No Has patient had a PCN reaction occurring within the last 10 years: Yes If all of the above answers are "NO", then may proceed with Cephalosporin use.   Marland Kitchen. Morphine And Related Other (See  Comments)    Patient states that it makes her lose her state of mind.   . Demerol [Meperidine] Rash    Family History:  Family History  Problem Relation Age of Onset  . Heart disease Father   . Diabetes Other   . Stroke Mother   . Scleroderma Brother     Prior to Admission medications   Medication Sig Start Date End Date Taking? Authorizing Provider  acetaminophen (TYLENOL) 500 MG tablet Take 1,000 mg by mouth 2 (two) times daily.    [provider]  amLODipine (NORVASC) 10 MG tablet Take 1 tablet (10 mg total) by mouth daily. 12/16/18 12/16/19  Leroy SeaSingh, Prashant K, MD  BYDUREON 2 MG PEN Inject 2 mg into the skin once a week. 01/03/16   [provider]  carvedilol (COREG) 3.125 MG tablet TAKE 1 TABLET BY MOUTH TWICE DAILY WITH A MEAL Patient taking differently: Take 3.125 mg by mouth 2 (two) times daily with a meal.  09/09/18   Laqueta LindenKoneswaran, Suresh A, MD  Cholecalciferol (VITAMIN D3) 3000 UNITS TABS Take 1 capsule by mouth daily.    [provider]  clobetasol cream (TEMOVATE) 0.05 % Apply 1 application topically daily.  11/11/18   [provider]  clomiPRAMINE (ANAFRANIL) 25 MG capsule Take 25 mg by mouth 4 (four) times daily.     [provider]  clonazePAM (KLONOPIN) 1 MG tablet Take 1 tablet (1 mg total) by mouth at bedtime. 12/15/18   Leroy SeaSingh, Prashant K, MD  DULoxetine (CYMBALTA) 60 MG capsule Take 60 mg by mouth daily.      [provider]  ferrous sulfate 325 (65 FE) MG tablet Take 325 mg by mouth 2 (two) times daily with a meal.     [provider]  furosemide (LASIX) 40 MG tablet Take 20 mg by mouth 2 (two) times daily.  12/01/14   [provider]  gabapentin (NEURONTIN) 300 MG capsule Take 300 mg by mouth 2 (two) times daily.    [provider]  insulin glargine (LANTUS) 100 unit/mL SOPN Inject 24 Units into the skin daily.     [provider]  metFORMIN (GLUCOPHAGE) 500 MG tablet Take 500 mg by  mouth 2 (two) times daily with a meal.    [provider]  methylPREDNISolone (MEDROL DOSEPAK) 4 MG TBPK tablet follow package directions 12/16/18   Leroy SeaSingh, Prashant K, MD  mirtazapine (REMERON) 7.5 MG tablet Take 1 tablet (7.5 mg total) by mouth at bedtime. 12/15/18   Leroy SeaSingh, Prashant K, MD  Multiple Vitamin (MULTIVITAMIN WITH MINERALS) TABS tablet Take 1 tablet by mouth daily.    [provider]  potassium chloride (K-DUR) 10 MEQ tablet Take 1 tablet (10 mEq total) by mouth daily. Patient taking differently: Take 10 mEq by mouth once.  09/21/14   Dyann KiefLenze, Michele M, PA-C  QUEtiapine (SEROQUEL) 100 MG tablet Take 100 mg by mouth at bedtime.    [provider]  vitamin C (ASCORBIC ACID) 500 MG tablet Take 500 mg by mouth daily.    [provider]  warfarin (COUMADIN) 5 MG tablet Take 2.5-5 mg by mouth every evening. Starting 10/29/2016 take 2.5 mg alternating with 5 mg daily     [provider]  Review of Systems:  Negative except as otherwise mentioned in HPI.   Physical Exam: Vitals:   01/01/19 0600 01/01/19 0630 01/01/19 0645 01/01/19 0700  BP: 124/65 140/72  (!) 155/54  Pulse: (!) 58 61 60 (!) 58  Resp: (!) 21 20 (!) 21 20  SpO2: 95% 100% 100% 100%    Constitutional: Afebrile, no nausea, no vomiting.  No icterus.  Morbidly obese in appearance, having difficulty to speak in full sentences and complaining of orthopnea.  Improvement in her respiratory status and now able to maintain oxygen saturation above 90% on 4-5 L nasal cannula. Eyes: PERRL, lids and conjunctivae normal, no icterus ENMT: Mucous membranes are moist. Posterior pharynx clear of any exudate or lesions. No Trush. Neck: normal, supple, no masses, no thyromegaly Respiratory: Positive tachypnea, fine crackles at the bases, diffuse rhonchi, No use of accessory muscles. Cardiovascular: Regular rate, no rubs, no gallops, unable to assess JVD with body habitus.  1-2 plus edema  bilaterally.  Abdomen: Obese, no tenderness, no masses palpated. No hepatosplenomegaly. Bowel sounds positive.  Musculoskeletal: no clubbing / cyanosis. No joint deformity upper and lower extremities. Normal muscle tone.  Skin: no petechiae or open wounds appreciated on exam. Neurologic: CN 2-12 grossly intact. Sensation intact, DTR normal. Strength 4/5 in all 4 limbs secondary to poor effort. Psychiatric: Normal judgment and insight. Alert and oriented x 3. Normal mood.    Labs on Admission: I have personally reviewed the following labs and imaging studies  CBC: Recent Labs  Lab 01/01/19 0555  WBC 8.8  NEUTROABS 6.9  HGB 11.5*  HCT 37.2  MCV 97.1  PLT 292   Basic Metabolic Panel: Recent Labs  Lab 01/01/19 0555  NA 139  K 4.3  CL 99  CO2 29  GLUCOSE 219*  BUN 20  CREATININE 1.09*  CALCIUM 8.8*   GFR: CrCl cannot be calculated (Unknown ideal weight.).   Liver Function Tests: Recent Labs  Lab 01/01/19 0555  AST 24  ALT 26  ALKPHOS 58  BILITOT 0.7  PROT 7.5  ALBUMIN 3.1*   Urine analysis:    Component Value Date/Time   COLORURINE AMBER (A) 12/08/2018 2046   APPEARANCEUR CLOUDY (A) 12/08/2018 2046   LABSPEC 1.019 12/08/2018 2046   PHURINE 5.0 12/08/2018 2046   GLUCOSEU NEGATIVE 12/08/2018 2046   HGBUR NEGATIVE 12/08/2018 2046   BILIRUBINUR NEGATIVE 12/08/2018 2046   KETONESUR 5 (A) 12/08/2018 2046   PROTEINUR 30 (A) 12/08/2018 2046   UROBILINOGEN 0.2 09/05/2014 2015   NITRITE POSITIVE (A) 12/08/2018 2046   LEUKOCYTESUR TRACE (A) 12/08/2018 2046    Recent Results (from the past 240 hour(s))  Blood culture (routine x 2)     Status: None (Preliminary result)   Collection Time: 01/01/19  5:55 AM   Specimen: BLOOD  Result Value Ref Range Status   Specimen Description BLOOD RIGHT ARM  Final   Special Requests   Final    BOTTLES DRAWN AEROBIC AND ANAEROBIC Blood Culture adequate volume Performed at [facility], [address] [place], [place] [address]     Culture PENDING  Incomplete   Report Status PENDING  Incomplete     Radiological Exams on Admission: Dg Chest Portable 1 View  Result Date: 01/01/2019 CLINICAL DATA:  Shortness of breath, COVID-19 positive 5 weeks prior EXAM: PORTABLE CHEST 1 VIEW COMPARISON:  Radiograph 12/08/2018, CT 09/04/2018 FINDINGS: Multifocal areas of airspace opacity throughout the lungs. Cardiomegaly, increased from comparison. Dense mitral annular calcification is seen. Pacer pack overlies the  left chest wall with leads at the apex and right atrium. No pneumothorax. Probable left effusion. No acute osseous or soft tissue abnormality. IMPRESSION: 1. Multifocal areas of airspace opacity throughout the lungs, suspicious for multifocal pneumonia. 2. Cardiomegaly, increased from comparison. Electronically Signed   By: Lovena Le M.D.   On: 01/01/2019 06:10    EKG: Independently reviewed.  No acute ischemic changes appreciated.  Sinus rhythm.  Assessment/Plan 1-acute on chronic respiratory failure with hypoxia (HCC) -Appears to be secondary to acute on chronic diastolic heart failure -There is also concerns for potential pulmonary fibrosis as sequela from Covid infection. -Normal WBCs, no fever, low procalcitonin level.  Will hold any further antibiotic therapy at this time. -Started on IV Lasix, follow daily weights, strict I's and O's, low-sodium diet. -Wean oxygen supplementation as tolerated.  (Patient chronically uses 2-3 L at baseline, mainly at nighttime). -Continue PRN bronchodilator -Will check CT chest without contrast to rule out pulmonary fibrosis; if present will start treatment with steroids. -Follow clinical response. -Admit to stepdown bed.  2-paroxysmal atrial fibrillation -Continue beta-blocker for rate control -Continue warfarin for secondary prevention; Pharmacy to dose.  3-gastroesophageal reflux disease -Continue PPI  4-essential hypertension -Overall stable -Continue current  antihypertensive regimen and follow vital signs. -Heart healthy diet ordered.  5-depression/anxiety -Continue the use of Cymbalta, Seroquel and as needed Klonopin.  6-acute on chronic diastolic heart failure -As mentioned above will provide IV diuresis -Low-sodium diet, strict I's and O's follow daily weights. -Continue beta-blockers.  7-morbid obesity -Check BMI, per chart review above 50 previously. -Low calorie diet, portion control and increase physical activity discussed with patient.  8-restless leg syndrome -Continue Mirapex.  9-type 2 diabetes mellitus with nephropathy -Follow A1c -Started on sliding scale insulin and Lantus -Follow CBGs and adjust hypoglycemic regimen as needed. -Hold oral hypoglycemic agents while inpatient.  10-chronic kidney disease is stage IIIa -Appears to be stable and at baseline -Closely monitor renal function with active diuresis.   DVT prophylaxis: Chronically on warfarin Code Status: Full code Family Communication: Son updated over the phone. Disposition Plan: Hopefully discharge back home with home health services once respiratory status is stabilized. Consults called: None Admission status: Inpatient, length of stay more than 2 midnights, Stepdown bed.   Time Spent: 70 minutes.  Barton Dubois MD [facility] Pager [phone]   01/01/2019, 7:55 AM

## 2019-01-01 NOTE — ED Notes (Signed)
CRITICAL VALUE ALERT  Critical Value:  Covid positive  Date & Time Notied:  01/01/19 0906  Provider Notified: Dr. Dyann Kief  Orders Received/Actions taken: MD notified

## 2019-01-01 NOTE — ED Triage Notes (Signed)
Pt from home via [facility]. Pts initial O2 sats 72% upon EMS arrival. Pt positive Covid 5 weeks ago and Negative test 2 weeks ago per Ems. Pt 100% on 15L NRB mask.

## 2019-01-01 NOTE — ED Notes (Signed)
Per Dr. Dyann Kief, after speaking with ID pt no longer requires airborne precautions for positive covid result.

## 2019-01-01 NOTE — Progress Notes (Signed)
ANTICOAGULATION CONSULT NOTE - Initial Consult  Pharmacy Consult for warfarin Indication: hx VTE  Allergies  Allergen Reactions  . Codeine Anxiety  . Compazine Anaphylaxis  . Other Anaphylaxis, Rash and Other (See Comments)    Uncoded Allergy. Allergen: INOVAR Uncoded Allergy. Allergen: ALL ADHESIVES Uncoded Allergy. Allergen: SILK SUTURE Uncoded Allergy. Allergen: merthiolate Thermasol  . Penicillins Shortness Of Breath and Rash    Has patient had a PCN reaction causing immediate rash, facial/tongue/throat swelling, SOB or lightheadedness with hypotension: Yes Has patient had a PCN reaction causing severe rash involving mucus membranes or skin necrosis: No Has patient had a PCN reaction that required hospitalization No Has patient had a PCN reaction occurring within the last 10 years: Yes If all of the above answers are "NO", then may proceed with Cephalosporin use.   Marland Kitchen Morphine And Related Other (See Comments)    Patient states that it makes her lose her state of mind.   . Demerol [Meperidine] Rash    Patient Measurements:   Vital Signs: Temp: 98.8 F (37.1 C) (11/27 1101) Temp Source: Rectal (11/27 1101) BP: 130/55 (11/27 1330) Pulse Rate: 55 (11/27 1330)  Labs: Recent Labs    01/01/19 0555 01/01/19 0742  HGB 11.5*  --   HCT 37.2  --   PLT 292  --   LABPROT 27.7*  --   INR 2.6*  --   CREATININE 1.09*  --   TROPONINIHS 12 13    CrCl cannot be calculated (Unknown ideal weight.).   Medical History: Past Medical History:  Diagnosis Date  . Aortic stenosis   . Cataplexy   . Diastolic heart failure ([place])   . Essential hypertension   . GERD (gastroesophageal reflux disease)   . Hiatal hernia   . History of recurrent deep vein thrombosis (DVT)   . Mitral stenosis   . Narcolepsy   . Neuropathy   . Renal disorder   . Type 2 diabetes mellitus (HCC)     Medications:  (Not in a hospital admission)   Assessment: Pharmacy consulted to dose warfarin in  patient with history of DVT.  Patient's listed home dose of warfarin is alternating 2.5 mg and 5 mg every other day (5 mg on 11/26)  INR today therapeutic at 2.6   Goal of Therapy:  INR 2-3 Monitor platelets by anticoagulation protocol: Yes   Plan:  Warfarin 2.5 mg x 1 dose. Monitor daily INR and s/s of bleeding.  Margot Ables, PharmD Clinical Pharmacist 01/01/2019 1:41 PM

## 2019-01-01 NOTE — Progress Notes (Signed)
Pharmacy Antibiotic Note  Rebecca [name] is a 78 y.o. female admitted on 01/01/2019 with pneumonia.  Pharmacy has been consulted for Vancomycin dosing. ED MD also ordered Aztreonam 2gm IV x 1 dose.  Pt wt ~108 kg Estimated CrCl ~71ml/min  Plan: Vancomycin 2000 mg IV loading dose then 1000mg  IV Q 24 hrs. Goal AUC 400-550. Expected AUC: 470 SCr used: 1.09, Vd coeff 0.5 with BMI > 30 Will f/u renal function, micro data, and pt's clinical condition Vanc levels prn F/u gram negative continuation on admission      No data recorded.  Recent Labs  Lab 01/01/19 0555  WBC 8.8  CREATININE 1.09*    CrCl cannot be calculated (Unknown ideal weight.).    Allergies  Allergen Reactions  . Codeine Anxiety  . Compazine Anaphylaxis  . Other Anaphylaxis, Rash and Other (See Comments)    Uncoded Allergy. Allergen: INOVAR Uncoded Allergy. Allergen: ALL ADHESIVES Uncoded Allergy. Allergen: SILK SUTURE Uncoded Allergy. Allergen: merthiolate Thermasol  . Penicillins Shortness Of Breath and Rash    Has patient had a PCN reaction causing immediate rash, facial/tongue/throat swelling, SOB or lightheadedness with hypotension: Yes Has patient had a PCN reaction causing severe rash involving mucus membranes or skin necrosis: No Has patient had a PCN reaction that required hospitalization No Has patient had a PCN reaction occurring within the last 10 years: Yes If all of the above answers are "NO", then may proceed with Cephalosporin use.   Marland Kitchen Morphine And Related Other (See Comments)    Patient states that it makes her lose her state of mind.   . Demerol [Meperidine] Rash    Antimicrobials this admission: 11/27 Vanc >>  11/27 Aztreonam x1  Microbiology results: 11/27 BCx:   Thank you for allowing pharmacy to be a part of this patient's care.  Sherlon Handing, PharmD, BCPS Please see amion for complete clinical pharmacist phone list 01/01/2019 6:47 AM

## 2019-01-01 NOTE — TOC Initial Note (Addendum)
Transition of Care [facility]) - Initial/Assessment Note    Patient Details  Name: Rebecca [name] MRN: [id number] Date of Birth: Jan 03, 1941  [facility] CM/SW Contact:    Cherine Drumgoole, Chauncey Reading, RN Phone Number: 01/01/2019, 12:17 PM  Clinical Narrative:   Chart reviewed. Patient is in the ED, being admitted, awaiting room.    " Patient presents with worsening shortness of breath since yesterday.  Patient was positive for Covid 5 weeks prior and had negative test 2 weeks ago.  Patient denies fever however has had a cough shortness of breath.  No significant sick contacts.  Patient on nonrebreather on arrival." Positive covid test today.   High risk readmission. Recent stay at [facility].   TOC to follow and address needs.               Expected Discharge Plan: [place] Barriers to Discharge: Continued Medical Work up    Expected Discharge Plan and Services Expected Discharge Plan: [place]   Discharge Planning Services: CM Consult   Living arrangements for the past 2 months: Single Family Home                       Prior Living Arrangements/Services Living arrangements for the past 2 months: Single Family Home Lives with:: Adult Children          Need for Family Participation in Patient Care: Yes (Comment) Care giver support system in place?: Yes (comment)      Activities of Daily Living Home Assistive Devices/Equipment: Wheelchair ADL Screening (condition at time of admission) Patient's cognitive ability adequate to safely complete daily activities?: No Is the patient deaf or have difficulty hearing?: Yes Does the patient have difficulty seeing, even when wearing glasses/contacts?: No Does the patient have difficulty concentrating, remembering, or making decisions?: Yes Patient able to express need for assistance with ADLs?: Yes Does the patient have difficulty dressing or bathing?: Yes Independently performs ADLs?:  No Communication: Independent Dressing (OT): Needs assistance Is this a change from baseline?: Pre-admission baseline Grooming: Needs assistance Is this a change from baseline?: Pre-admission baseline Feeding: Independent Bathing: Needs assistance Is this a change from baseline?: Pre-admission baseline Toileting: Needs assistance Is this a change from baseline?: Pre-admission baseline In/Out Bed: Needs assistance Is this a change from baseline?: Pre-admission baseline Walks in Home: Needs assistance Is this a change from baseline?: Pre-admission baseline Does the patient have difficulty walking or climbing stairs?: Yes Weakness of Legs: Both Weakness of Arms/Hands: None  Permission Sought/Granted                  Emotional Assessment              Admission diagnosis:  covid positive Patient Active Problem List   Diagnosis Date Noted  . Acute on chronic respiratory failure with hypoxia ([place]) 01/01/2019  . Paroxysmal atrial fibrillation ([place]) 12/09/2018  . Acute on chronic respiratory failure ([place]) 12/09/2018  . Pneumonia due to COVID-19 virus 12/08/2018  . History of recurrent deep vein thrombosis (DVT)   . Type 2 diabetes mellitus ([place])   . Lobar pneumonia ([place]) 09/05/2018  . Acute on chronic diastolic CHF (congestive heart failure) ([place]) 09/05/2018  . CHF (congestive heart failure) ([place]) 09/04/2018  . HCAP (healthcare-associated pneumonia) 03/29/2017  . Cellulitis 03/03/2017  . Type 2 diabetes mellitus with other specified complication ([place]) 15/17/6160  . CKD (chronic kidney disease), stage III 03/03/2017  . Status  post placement of cardiac pacemaker 10/29/2016  . Pulmonary edema 10/23/2016  . Acute respiratory failure with hypoxia (HCC) 02/22/2015  . CAP (community acquired pneumonia) 02/22/2015  . Fall at home 09/21/2014  . Mild aortic stenosis 09/08/2014  . Mild to moderate  mitral stenosis 09/08/2014  . Chronic anticoagulation-Coumadin 09/08/2014  .  Sepsis (HCC) 09/05/2014  . Elevated troponin 09/05/2014  . Lower leg DVT (deep venous thromboembolism), chronic (HCC) 09/05/2014  . Essential hypertension 09/05/2014  . Depression 09/05/2014  . Poorly controlled diabetes mellitus (HCC) 09/05/2014  . Hypomagnesemia 09/05/2014  . Hyponatremia 09/05/2014  . Pyelonephritis 09/05/2014  . Chronic diastolic CHF (congestive heart failure) (HCC)   . 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 GERD (gastroesophageal reflux disease) 07/08/2011  . Gastroparesis 07/08/2011  . IDA (iron deficiency anemia) 07/08/2011  . History of colonic polyps 07/08/2011  . Narcolepsy-evaluated at [facility] 07/08/2011  . NAFLD (nonalcoholic fatty liver disease) 16/11/9602  . Obesity 07/08/2011   PCP:  [name], MD Pharmacy:   [facility] [phone] - [place], [place] - [address] [place] [address] Phone: [phone] Fax: [phone]     Social Determinants of Health (SDOH) Interventions    Readmission Risk Interventions No flowsheet data found.

## 2019-01-01 NOTE — ED Notes (Signed)
Entered room to obtain covid test. Pt found to be on NRB with increased wob. Sats 100%. Switched to 15L high flow [place] for trial. Pt alert and oriented, denies any needs. Verified purewick attached to suction and functioning.

## 2019-01-02 DIAGNOSIS — E1322 Other specified diabetes mellitus with diabetic chronic kidney disease: Secondary | ICD-10-CM

## 2019-01-02 DIAGNOSIS — N183 Chronic kidney disease, stage 3 unspecified: Secondary | ICD-10-CM

## 2019-01-02 DIAGNOSIS — I480 Paroxysmal atrial fibrillation: Secondary | ICD-10-CM

## 2019-01-02 DIAGNOSIS — Z6835 Body mass index (BMI) 35.0-35.9, adult: Secondary | ICD-10-CM

## 2019-01-02 DIAGNOSIS — J849 Interstitial pulmonary disease, unspecified: Secondary | ICD-10-CM

## 2019-01-02 DIAGNOSIS — E6609 Other obesity due to excess calories: Secondary | ICD-10-CM

## 2019-01-02 DIAGNOSIS — I129 Hypertensive chronic kidney disease with stage 1 through stage 4 chronic kidney disease, or unspecified chronic kidney disease: Secondary | ICD-10-CM

## 2019-01-02 LAB — GLUCOSE, CAPILLARY
Glucose-Capillary: 182 mg/dL — ABNORMAL HIGH (ref 70–99)
Glucose-Capillary: 217 mg/dL — ABNORMAL HIGH (ref 70–99)
Glucose-Capillary: 184 mg/dL — ABNORMAL HIGH (ref 70–99)
Glucose-Capillary: 236 mg/dL — ABNORMAL HIGH (ref 70–99)

## 2019-01-02 LAB — PROTIME-INR
INR: 2.6 — ABNORMAL HIGH (ref 0.8–1.2)
Prothrombin Time: 27.7 seconds — ABNORMAL HIGH (ref 11.4–15.2)

## 2019-01-02 LAB — PROCALCITONIN: Procalcitonin: <0.1 ng/mL

## 2019-01-02 MED ORDER — PREDNISONE 20 MG PO TABS
40.0000 mg | ORAL_TABLET | Freq: Every day | ORAL | Status: DC
  Administered 2019-01-03 – 2019-01-07 (×5): 40 mg via ORAL
  Filled 2019-01-02 (×7): qty 2

## 2019-01-02 MED ORDER — DICLOFENAC SODIUM 1 % EX GEL
4.0000 g | Freq: Three times a day (TID) | CUTANEOUS | Status: DC
  Administered 2019-01-02 – 2019-01-07 (×15): 4 g via TOPICAL
  Filled 2019-01-02: qty 100

## 2019-01-02 MED ORDER — INSULIN GLARGINE 100 UNIT/ML ~~LOC~~ SOLN
15.0000 [IU] | Freq: Every day | SUBCUTANEOUS | Status: DC
  Administered 2019-01-03 – 2019-01-05 (×3): 15 [IU] via SUBCUTANEOUS
  Filled 2019-01-02 (×4): qty 0.15

## 2019-01-02 MED ORDER — WARFARIN SODIUM 5 MG PO TABS
5.0000 mg | ORAL_TABLET | Freq: Once | ORAL | Status: AC
  Administered 2019-01-02: 5 mg via ORAL
  Filled 2019-01-02: qty 1

## 2019-01-02 MED ORDER — TRAZODONE HCL 50 MG PO TABS
50.0000 mg | ORAL_TABLET | Freq: Once | ORAL | Status: AC
  Administered 2019-01-02: 23:00:00 50 mg via ORAL
  Filled 2019-01-02: qty 1

## 2019-01-02 MED ORDER — WARFARIN SODIUM 2.5 MG PO TABS: 2.5000 mg | ORAL_TABLET | Freq: Once | ORAL | Status: DC

## 2019-01-02 MED ORDER — ACETAMINOPHEN 325 MG PO TABS
650.0000 mg | ORAL_TABLET | Freq: Four times a day (QID) | ORAL | Status: DC | PRN
  Administered 2019-01-02 – 2019-01-04 (×4): 650 mg via ORAL
  Filled 2019-01-02 (×4): qty 2

## 2019-01-02 MED ORDER — NYSTATIN 100000 UNIT/GM EX POWD
Freq: Three times a day (TID) | CUTANEOUS | Status: DC
  Administered 2019-01-02 – 2019-01-07 (×16): via TOPICAL
  Filled 2019-01-02 (×3): qty 15

## 2019-01-02 NOTE — Plan of Care (Signed)
  Problem: Acute Rehab PT Goals(only PT should resolve) Goal: Pt Will Go Supine/Side To Sit Outcome: Progressing Flowsheets (Taken 01/02/2019 1111) Pt will go Supine/Side to Sit: with minimal assist Goal: Patient Will Transfer Sit To/From Stand Outcome: Progressing Flowsheets (Taken 01/02/2019 1111) Patient will transfer sit to/from stand: with minimal assist Goal: Pt Will Transfer Bed To Chair/Chair To Bed Outcome: Progressing Flowsheets (Taken 01/02/2019 1111) Pt will Transfer Bed to Chair/Chair to Bed: with min assist Goal: Pt Will Ambulate Outcome: Progressing Flowsheets (Taken 01/02/2019 1111) Pt will Ambulate:  25 feet  with minimal assist  with moderate assist  with rolling walker   11:13 AM, 01/02/19 Lonell Grandchild, MPT Physical Therapist with [facility] 336 [phone] office [phone] mobile phone

## 2019-01-02 NOTE — Evaluation (Signed)
Physical Therapy Evaluation Patient Details Name: Rebecca [name] MRN: [id number] DOB: September 03, 1940 Today's Date: 01/02/2019   History of Present Illness  Rebecca [name] is a 78 y.o. female with a past medical history significant for essential hypertension, chronic diastolic heart failure, aortic stenosis, cataplexy, morbid obesity, narcolepsy, history of recurrent DVTs, gastroesophageal reflux disease, type 2 diabetes mellitus with nephropathy, chronic kidney disease a stage IIIa and a recent admission secondary to Covid pneumonia; who presented to the hospital secondary to worsening shortness of breath and nonproductive cough.  Patient also expressed general malaise and increased lower extremity swelling.  She reports that for the last 2 days the breathing compromises to the point that her oxygen supplementation was not enough.  Patient reported sudden chest tightness sensation on the night prior to admission and expressed mild orthopnea.  Patient denies any nausea, vomiting, diarrhea, abdominal pain, fever, chills, chest pain, hematemesis, hematochezia, dysuria and hematuria.    Clinical Impression  Patient very unsteady on feet and at high risk for falls, limited to a few steps at bedside due to poor standing balance and BLE weakness, c/o severe pain in right knee during bed mobility and tolerated sitting up in chair after therapy - nursing staff aware.  Patient will benefit from continued physical therapy in hospital and recommended venue below to increase strength, balance, endurance for safe ADLs and gait.    Follow Up Recommendations SNF    Equipment Recommendations  None recommended by PT    Recommendations for Other Services       Precautions / Restrictions Precautions Precautions: Fall Restrictions Weight Bearing Restrictions: No      Mobility  Bed Mobility Overal bed mobility: Needs Assistance Bed Mobility: Supine to Sit     Supine to sit: Mod assist     General bed  mobility comments: slow labored movement with c/o severe pain in right knee  Transfers Overall transfer level: Needs assistance Equipment used: Rolling walker (2 wheeled) Transfers: Sit to/from [facility] Sit to Stand: Mod assist Stand pivot transfers: Mod assist       General transfer comment: very unsteady on feet, increased time  Ambulation/Gait Ambulation/Gait assistance: Mod assist;Max assist Gait Distance (Feet): 4 Feet Assistive device: Rolling walker (2 wheeled) Gait Pattern/deviations: Decreased step length - right;Decreased step length - left;Decreased stride length Gait velocity: decreased   General Gait Details: limited to 4-5 slow unsteady labored steps at bedside due to c/o fatigue and generalized weakness  Stairs            Wheelchair Mobility    Modified Rankin (Stroke Patients Only)       Balance Overall balance assessment: Needs assistance Sitting-balance support: Feet supported;No upper extremity supported Sitting balance-Leahy Scale: Fair Sitting balance - Comments: seated at bedside   Standing balance support: Bilateral upper extremity supported;During functional activity Standing balance-Leahy Scale: Poor Standing balance comment: using RW                             Pertinent Vitals/Pain Pain Assessment: Faces Faces Pain Scale: Hurts whole lot Pain Location: right knee Pain Descriptors / Indicators: Sore;Sharp Pain Intervention(s): Limited activity within patient's tolerance;Monitored during session    Home Living Family/patient expects to be discharged to:: Private residence Living Arrangements: Children Available Help at Discharge: Available PRN/intermittently;Family Type of Home: House Home Access: Ramped entrance     Home Layout: One level Home Equipment: [place] - 4 wheels;Shower seat;Wheelchair - manual  Additional Comments: 2L home O2 at night    Prior Function Level of Independence: Independent  with assistive device(s)   Gait / Transfers Assistance Needed: household ambulator with Rollator  ADL's / Homemaking Assistance Needed: assisted by family        Hand Dominance   Dominant Hand: Right    Extremity/Trunk Assessment   Upper Extremity Assessment Upper Extremity Assessment: Generalized weakness    Lower Extremity Assessment Lower Extremity Assessment: Generalized weakness    Cervical / Trunk Assessment Cervical / Trunk Assessment: Normal  Communication   Communication: No difficulties  Cognition Arousal/Alertness: Awake/alert Behavior During Therapy: WFL for tasks assessed/performed Overall Cognitive Status: Within Functional Limits for tasks assessed                                        General Comments      Exercises     Assessment/Plan    PT Assessment Patient needs continued PT services  PT Problem List Decreased strength;Decreased activity tolerance;Decreased balance;Decreased mobility       PT Treatment Interventions Gait training;Functional mobility training;Therapeutic activities;Therapeutic exercise;Patient/family education;Stair training    PT Goals (Current goals can be found in the Care Plan section)  Acute Rehab PT Goals Patient Stated Goal: return home after rehab PT Goal Formulation: With patient Time For Goal Achievement: 02/12/19 Potential to Achieve Goals: Good    Frequency Min 3X/week   Barriers to discharge        Co-evaluation               AM-PAC PT "6 Clicks" Mobility  Outcome Measure Help needed turning from your back to your side while in a flat bed without using bedrails?: A Lot Help needed moving from lying on your back to sitting on the side of a flat bed without using bedrails?: A Lot Help needed moving to and from a bed to a chair (including a wheelchair)?: A Lot Help needed standing up from a chair using your arms (e.g., wheelchair or bedside chair)?: A Lot Help needed to walk in  hospital room?: A Lot Help needed climbing 3-5 steps with a railing? : Total 6 Click Score: 11    End of Session Equipment Utilized During Treatment: Oxygen Activity Tolerance: Patient tolerated treatment well;Patient limited by fatigue Patient left: in chair;with call bell/phone within reach;with nursing/sitter in room Nurse Communication: Mobility status PT Visit Diagnosis: Muscle weakness (generalized) (M62.81);Unsteadiness on feet (R26.81);Other abnormalities of gait and mobility (R26.89)    Time: 8638-1771 PT Time Calculation (min) (ACUTE ONLY): 31 min   Charges:   PT Evaluation $PT Eval Moderate Complexity: 1 Mod PT Treatments $Therapeutic Activity: 23-37 mins        11:09 AM, 01/02/19 Ocie Bob, MPT Physical Therapist with [facility] 336 [phone] office [phone] mobile phone

## 2019-01-02 NOTE — Progress Notes (Signed)
PROGRESS NOTE    Rebecca [name]  YNW:[id number] DOB: [birth year] DOA: 01/01/2019 PCP: Oval Linseyondiego, Richard, MD     Brief Narrative:  78 y.o. female with a past medical history significant for essential hypertension, chronic diastolic heart failure, aortic stenosis, cataplexy, morbid obesity, narcolepsy, history of recurrent DVTs, gastroesophageal reflux disease, type 2 diabetes mellitus with nephropathy, chronic kidney disease a stage IIIa and a recent admission secondary to Covid pneumonia; who presented to the hospital secondary to worsening shortness of breath and nonproductive cough.  Patient also expressed general malaise and increased lower extremity swelling.  She reports that for the last 2 days the breathing compromises to the point that her oxygen supplementation was not enough.  Patient reported sudden chest tightness sensation on the night prior to admission and expressed mild orthopnea.   Patient denies any nausea, vomiting, diarrhea, abdominal pain, fever, chills, chest pain, hematemesis, hematochezia, dysuria and hematuria.  Work-up in the ED demonstrated elevated BNP (even lower than prior levels), oxygen supplementation at the time the EMS picked her up to be in the mid 70s and transiently requiring nonrebreather mask.  Chest x-ray with multifocal infiltrates, vascular congestion and interstitial edema.  Blood work demonstrated normal WBCs, patient is afebrile, elevated fibrinogen of 564, CBG of 226, LDH 199,CRP 6.5, D-dimer 1.68, procalcitonin level < 0.10, repeat Covid test by PCR positive; negative high sensitive troponin X 2.  Blood cultures were taken, IV antibiotics broad-spectrum initiated and [facility] contacted to admit patient for further evaluation and management.   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 -Continue IV diuresis -Low-sodium diet, daily weights and strict I's and O's -Will start steroids with intention for slow tapering -Continue to wean down oxygen supplementation to baseline; she uses 2-3 L nasal cannula mainly at nighttime; but as per son reports since most recent Covid infection and admission, has been requiring the oxygen supplementation intermittently throughout the day as well. -Increase physical activity, wean down oxygen supplementation as tolerated and follow clinical response.  2-Acute on chronic diastolic heart failure -As mentioned above continue monitoring on telemetry -Continue IV diuresis -Follow daily weights, I's and O's and low-sodium diet.  3-Paroxysmal atrial fibrillation -Continue beta-blocker for rate control -Continue warfarin for secondary prevention; pharmacy dosing -INR within normal limits.  4-Essential hypertension -Overall stable -Continue current antihypertensive regimen -Follow vital signs -Continue heart healthy diet.  5-reflux disease -Continue PPI.  6-depression and/anxiety -Continue the use of Cymbalta, Seroquel and as needed Klonopin. -Overall mood is a stable.  7-physical deconditioning -Physical therapy has evaluated patient and is recommending skilled nursing facility at discharge for further rehabilitation. -Social worker will be made aware.  8-class II morbid obesity -Body mass index is 35.2 kg/m. -Low calorie diet, portion control and increase physical activity discussed with patient.  9-type 2 diabetes with nephropathy -A1c 8.2 -Continue sliding scale insulin and Lantus -Follow CBGs and adjust hypoglycemic regimen as needed -Initiation of his steroids might be responsible for fluctuation of her CBGs.  10-chronic kidney disease a stage IIIa -Stable and at baseline -Continue close monitoring of renal function while diuresing patient.  11-bilateral knee pain -secondary to OA -will apply  diclofenac gel -no swelling, no erythema on her joints appreciated.  DVT prophylaxis: Chronically on warfarin. Code Status: Full code Family Communication: No family at bedside. Disposition Plan: Remains inpatient, continue IV diuresis,  start prednisone for interstitial lung disease/presumption of development lung fibrosis after Covid infection.  Physical therapy have assessed patient and recommending skilled nursing facility at discharge.  Continue to wean oxygen supplementation as tolerated and increase activity. Follow clinical response.  Consultants:   None  Procedures:   See below for x-ray reports.  Antimicrobials:  Anti-infectives (From admission, onward)   Start     Dose/Rate Route Frequency Ordered Stop   01/02/19 0800  vancomycin (VANCOCIN) IVPB 1000 mg/200 mL premix  Status:  Discontinued     1,000 mg 200 mL/hr over 60 Minutes Intravenous Every 24 hours 01/01/19 0654 01/01/19 1332   01/01/19 0700  vancomycin (VANCOCIN) IVPB 1000 mg/200 mL premix     1,000 mg 200 mL/hr over 60 Minutes Intravenous Every 1 hr x 2 01/01/19 0654 01/01/19 1314   01/01/19 0645  aztreonam (AZACTAM) 2 g in sodium chloride 0.9 % 100 mL IVPB     2 g 200 mL/hr over 30 Minutes Intravenous  Once 01/01/19 0641 01/01/19 0915       Subjective: Afebrile, no chest pain, no nausea, no vomiting.  Still complaining of shortness of breath and mild orthopnea.  Reports bilateral knee pain left.  Overnight in the requiring high flow nasal cannula supplementation up to 8 L.  Objective: Vitals:   01/02/19 0819 01/02/19 0821 01/02/19 0859 01/02/19 0900  BP:    132/63  Pulse: (!) 59 (!) 59 62 (!) 58  Resp: 20 20 (!) 24 19  Temp:      TempSrc:      SpO2: 97% 96% 96% 97%  Weight:      Height:        Intake/Output Summary (Last 24 hours) at 01/02/2019 0926 Last data filed at 01/02/2019 0500 Gross per 24 hour  Intake 591.66 ml  Output 1450 ml  Net -858.34 ml   Filed Weights   01/01/19 1720 01/02/19  0500  Weight: 105.9 kg 105 kg    Examination: General exam: Alert, awake, oriented x 3; still complaining of shortness of breath and also bilateral knee pain.  No nausea, no vomiting, no chest pain. Respiratory system: Fine crackles at the bases, diffuse rhonchi bilaterally, no using accessory muscles.  Using 3.5 high flow nasal cannula supplementation with good O2 sat.   Cardiovascular system:RRR. No murmurs, rubs, gallops. Gastrointestinal system: Abdomen is obese, nondistended, soft and nontender. No organomegaly or masses felt. Normal bowel sounds heard. Central nervous system: Alert and oriented. No focal neurological deficits. Extremities: No cyanosis or clubbing; trace edema bilaterally appreciated. Skin: No petechiae, no open wounds. Psychiatry: Judgement and insight appear normal. Mood & affect appropriate.     Data Reviewed: I have personally reviewed following labs and imaging studies  CBC: Recent Labs  Lab 01/01/19 0555  WBC 8.8  NEUTROABS 6.9  HGB 11.5*  HCT 37.2  MCV 97.1  PLT 292   Basic Metabolic Panel: Recent Labs  Lab 01/01/19 0555  NA 139  K 4.3  CL 99  CO2 29  GLUCOSE 219*  BUN 20  CREATININE 1.09*  CALCIUM 8.8*   GFR: Estimated Creatinine Clearance: 53.1 mL/min (A) (by C-G formula based on SCr of 1.09 mg/dL (H)).   Liver Function Tests: Recent Labs  Lab 01/01/19 0555  AST 24  ALT 26  ALKPHOS 58  BILITOT 0.7  PROT 7.5  ALBUMIN 3.1*   Coagulation Profile: Recent Labs  Lab 01/01/19 0555 01/02/19 0406  INR 2.6* 2.6*   HbA1C: Recent Labs  01/01/19 0742  HGBA1C 8.2*   CBG: Recent Labs  Lab 01/01/19 0843 01/01/19 1240 01/01/19 1727 01/01/19 2157 01/02/19 0812  GLUCAP 181* 226* 210* 214* 182*   Anemia Panel: Recent Labs    01/01/19 1021  FERRITIN 195   Urine analysis:    Component Value Date/Time   COLORURINE AMBER (A) 12/08/2018 2046   APPEARANCEUR CLOUDY (A) 12/08/2018 2046   LABSPEC 1.019 12/08/2018 2046    PHURINE 5.0 12/08/2018 2046   GLUCOSEU NEGATIVE 12/08/2018 2046   HGBUR NEGATIVE 12/08/2018 2046   BILIRUBINUR NEGATIVE 12/08/2018 2046   KETONESUR 5 (A) 12/08/2018 2046   PROTEINUR 30 (A) 12/08/2018 2046   UROBILINOGEN 0.2 09/05/2014 2015   NITRITE POSITIVE (A) 12/08/2018 2046   LEUKOCYTESUR TRACE (A) 12/08/2018 2046    Recent Results (from the past 240 hour(s))  Blood culture (routine x 2)     Status: None (Preliminary result)   Collection Time: 01/01/19  5:55 AM   Specimen: BLOOD  Result Value Ref Range Status   Specimen Description BLOOD RIGHT ARM  Final   Special Requests   Final    BOTTLES DRAWN AEROBIC AND ANAEROBIC Blood Culture adequate volume   Culture   Final    NO GROWTH 1 DAY Performed at [facility], [address] [place], [place] [address]    Report Status PENDING  Incomplete  SARS Coronavirus 2 by RT PCR (hospital order, performed in [facility] hospital lab) Nasopharyngeal Nasopharyngeal Swab     Status: Abnormal   Collection Time: 01/01/19  7:37 AM   Specimen: Nasopharyngeal Swab  Result Value Ref Range Status   SARS Coronavirus 2 POSITIVE (A) NEGATIVE Final    Comment: RESULT CALLED TO, READ BACK BY AND VERIFIED WITH: WHITE M. @ 0905 ON [id number] BY HENDERSON L. (NOTE) SARS-CoV-2 target nucleic acids are DETECTED SARS-CoV-2 RNA is generally detectable in upper respiratory specimens  during the acute phase of infection.  Positive results are indicative  of the presence of the identified virus, but do not rule out bacterial infection or co-infection with other pathogens not detected by the test.  Clinical correlation with patient history and  other diagnostic information is necessary to determine patient infection status.  The expected result is negative. Fact Sheet for Patients:   BoilerBrush.com.cy  Fact Sheet for Healthcare Providers:   https://pope.com/   This test is not yet approved or cleared by the  [place] FDA and  has been authorized for detection and/or diagnosis of SARS-CoV-2 by FDA under an Emergency Use Authorization (EUA).  This EUA will remain in effect (meaning this t est can be used) for the duration of  the COVID-19 declaration under Section 564(b)(1) of the Act, 21 U.S.C. section 360-bbb-3(b)(1), unless the authorization is terminated or revoked sooner. Performed at [facility], [address] [place], [place] [address]   Blood culture (routine x 2)     Status: None (Preliminary result)   Collection Time: 01/01/19  7:42 AM   Specimen: BLOOD RIGHT FOREARM  Result Value Ref Range Status   Specimen Description   Final    BLOOD RIGHT FOREARM BOTTLES DRAWN AEROBIC AND ANAEROBIC   Special Requests   Final    Blood Culture results may not be optimal due to an inadequate volume of blood received in culture bottles   Culture   Final    NO GROWTH < 24 HOURS Performed at [facility], [address] [place], [place] [address]    Report Status PENDING  Incomplete  MRSA PCR Screening     Status: None   Collection Time: 01/01/19  5:20 PM   Specimen: Nasal Mucosa; Nasopharyngeal  Result Value Ref Range Status   MRSA by PCR NEGATIVE NEGATIVE Final    Comment:        The GeneXpert MRSA Assay (FDA approved for NASAL specimens only), is one component of a comprehensive MRSA colonization surveillance program. It is not intended to diagnose MRSA infection nor to guide or monitor treatment for MRSA infections. Performed at [facility], [address] [place], [place] [address]      Radiology Studies: Ct Chest Wo Contrast  Result Date: 01/01/2019 CLINICAL DATA:  Shortness of breath.  Recent COVID infection. EXAM: CT CHEST WITHOUT CONTRAST TECHNIQUE: Multidetector CT imaging of the chest was performed following the standard protocol without IV contrast. COMPARISON:  CT chest dated September 04, 2018 FINDINGS: Cardiovascular: The heart is enlarged. Aortic calcifications are  noted. Advanced coronary artery calcifications are noted. Annular mitral valve cast locations are again noted. There is no significant pericardial effusion. Mediastinum/Nodes: --there are enlarged mediastinal and hilar lymph nodes. These have slightly increased in size from prior study. For example there is a pretracheal lymph node measuring approximately 1.8 cm (previously measuring approximately 1.6 cm). --No axillary lymphadenopathy. --No supraclavicular lymphadenopathy. --there is a large left-sided thyroid nodule measuring approximately 4.5 cm. --The esophagus is unremarkable Lungs/Pleura: There are scattered bilateral pulmonary airspace opacities, many of which are ground-glass in appearance. There are areas of scarring and volume loss for example in the right middle lobe and bilateral lower lobes. There is a persistent mosaic appearance of the lung fields bilaterally. There is debris within the trachea. There is some mild bilateral interlobular septal thickening. There is consolidation at the lung bases bilaterally. There are moderate-sized bilateral pleural effusions. There is no pneumothorax. Upper Abdomen: No acute abnormality. There is a moderate-sized hiatal hernia. Musculoskeletal: Again noted is a compression fracture of the L1 vertebral body. This appears relatively stable from July 2020. Multilevel degenerative changes are noted throughout the visualized thoracolumbar spine. There are advanced degenerative changes of both glenohumeral joints. IMPRESSION: 1. Scattered bilateral airspace opacities with with larger areas of consolidation involving the bilateral lower lobes. Findings are concerning for an atypical infectious process in the appropriate clinical setting. These findings have progressed since July of 2020. A component of underlying interstitial lung disease is difficult to entirely exclude on this exam. 2. Moderate-sized bilateral pleural effusions. 3. 4.5 cm dominant left-sided thyroid  nodule. This meets criteria for follow-up with a nonemergent outpatient thyroid ultrasound. 4. Cardiomegaly with pulmonary edema. 5. Moderate-sized hiatal hernia. 6. Enlarged mediastinal lymph nodes, presumably reactive in etiology. 7. Persistent mosaic appearance of the lung fields bilaterally may be secondary to small airway disease or chronic occlusive vascular disease. Aortic Atherosclerosis (ICD10-I70.0). Electronically Signed   By: Constance Holster M.D.   On: 01/01/2019 15:29   Dg Chest Portable 1 View  Result Date: 01/01/2019 CLINICAL DATA:  Shortness of breath, COVID-19 positive 5 weeks prior EXAM: PORTABLE CHEST 1 VIEW COMPARISON:  Radiograph 12/08/2018, CT 09/04/2018 FINDINGS: Multifocal areas of airspace opacity throughout the lungs. Cardiomegaly, increased from comparison. Dense mitral annular calcification is seen. Pacer pack overlies the left chest wall with leads at the apex and right atrium. No pneumothorax. Probable left effusion. No acute osseous or soft tissue abnormality. IMPRESSION: 1. Multifocal areas of airspace opacity throughout the lungs, suspicious for multifocal pneumonia. 2. Cardiomegaly, increased from comparison. Electronically Signed  By: Kreg ShropshirePrice  DeHay M.D.   On: 01/01/2019 06:10   Scheduled Meds: . amLODipine  10 mg Oral Daily  . carvedilol  3.125 mg Oral BID WC  . Chlorhexidine Gluconate Cloth  6 each Topical Daily  . clomiPRAMINE  25 mg Oral QID  . clonazePAM  1 mg Oral QHS  . DULoxetine  60 mg Oral Daily  . furosemide  40 mg Intravenous Q12H  . gabapentin  300 mg Oral BID  . insulin aspart  0-15 Units Subcutaneous TID WC  . insulin aspart  0-5 Units Subcutaneous QHS  . insulin glargine  12 Units Subcutaneous Daily  . mirtazapine  7.5 mg Oral QHS  . multivitamin with minerals  1 tablet Oral Daily  . nystatin   Topical TID  . potassium chloride  10 mEq Oral Daily  . QUEtiapine  100 mg Oral QHS  . sodium chloride flush  3 mL Intravenous Q12H  . vitamin C   500 mg Oral Daily  . warfarin  5 mg Oral ONCE-1800  . Warfarin - Pharmacist Dosing Inpatient   Does not apply q1800  . zinc sulfate  220 mg Oral Daily   Continuous Infusions: . sodium chloride Stopped (01/01/19 1701)     LOS: 1 day    Time spent: 35 mintes. Greater than 50% of this time was spent in direct contact with the patient, coordinating care and discussing relevant ongoing clinical issues, including discussion about increase vascular congestion and interstitial edema changes. No frank findings or systemic signs of infection. Antibiotics on hold for now. Will continue focusing on diuresis. Wean O2 off as tolerated. Given findings of presuming interstitial lung disease, will add treatment with prednisone and follow slow tapering process.    Vassie Lollarlos Demitri Kucinski, MD [facility] Pager [phone]   01/02/2019, 9:26 AM

## 2019-01-02 NOTE — Progress Notes (Signed)
ANTICOAGULATION CONSULT NOTE - follow up [place] for warfarin Indication: hx VTE  Allergies  Allergen Reactions  . Codeine Anxiety  . Compazine Anaphylaxis  . Other Anaphylaxis, Rash and Other (See Comments)    Uncoded Allergy. Allergen: INOVAR Uncoded Allergy. Allergen: ALL ADHESIVES Uncoded Allergy. Allergen: SILK SUTURE Uncoded Allergy. Allergen: merthiolate Thermasol  . Penicillins Shortness Of Breath and Rash    Has patient had a PCN reaction causing immediate rash, facial/tongue/throat swelling, SOB or lightheadedness with hypotension: Yes Has patient had a PCN reaction causing severe rash involving mucus membranes or skin necrosis: No Has patient had a PCN reaction that required hospitalization No Has patient had a PCN reaction occurring within the last 10 years: Yes If all of the above answers are "NO", then may proceed with Cephalosporin use.   Marland Kitchen Morphine And Related Other (See Comments)    Patient states that it makes her lose her state of mind.   . Demerol [Meperidine] Rash    Patient Measurements: Height: 5\' 8"  (172.7 cm) Weight: 231 lb 7.7 oz (105 kg) IBW/kg (Calculated) : 63.9 Vital Signs: Temp: 97.8 F (36.6 C) (11/28 0521) Temp Source: Axillary (11/28 0005) BP: 138/89 (11/28 0700) Pulse Rate: 60 (11/28 0717)  Labs: Recent Labs    01/01/19 0555 01/01/19 0742 01/02/19 0406  HGB 11.5*  --   --   HCT 37.2  --   --   PLT 292  --   --   LABPROT 27.7*  --  27.7*  INR 2.6*  --  2.6*  CREATININE 1.09*  --   --   TROPONINIHS 12 13  --     Estimated Creatinine Clearance: 53.1 mL/min (A) (by C-G formula based on SCr of 1.09 mg/dL (H)).   Medical History: Past Medical History:  Diagnosis Date  . Aortic stenosis   . Cataplexy   . Diastolic heart failure ([place])   . Essential hypertension   . GERD (gastroesophageal reflux disease)   . Hiatal hernia   . History of recurrent deep vein thrombosis (DVT)   . Mitral stenosis   . Narcolepsy    . Neuropathy   . Renal disorder   . Type 2 diabetes mellitus (HCC)     Medications:  Medications Prior to Admission  Medication Sig Dispense Refill Last Dose  . acetaminophen (TYLENOL) 500 MG tablet Take 1,000 mg by mouth 2 (two) times daily as needed for mild pain.    12/31/2018 at Unknown time  . carvedilol (COREG) 3.125 MG tablet TAKE 1 TABLET BY MOUTH TWICE DAILY WITH A MEAL (Patient taking differently: Take 3.125 mg by mouth 2 (two) times daily with a meal. ) 60 tablet 6 12/31/2018 at 1700  . Cholecalciferol (VITAMIN D3) 3000 UNITS TABS Take 1 capsule by mouth daily.   12/31/2018 at Unknown time  . clomiPRAMINE (ANAFRANIL) 25 MG capsule Take 25 mg by mouth 4 (four) times daily.    12/31/2018 at 2000  . clonazePAM (KLONOPIN) 1 MG tablet Take 1 tablet (1 mg total) by mouth at bedtime.   12/31/2018 at 2000  . DULoxetine (CYMBALTA) 60 MG capsule Take 60 mg by mouth daily.     12/31/2018  . ferrous sulfate 325 (65 FE) MG tablet Take 325 mg by mouth 2 (two) times daily with a meal.    12/31/2018 at 2000  . furosemide (LASIX) 40 MG tablet Take 20 mg by mouth 2 (two) times daily.   5 12/31/2018 at 1700  . gabapentin (NEURONTIN)  300 MG capsule Take 300 mg by mouth 2 (two) times daily.   12/31/2018 at 2000  . insulin glargine (LANTUS) 100 unit/mL SOPN Inject 24 Units into the skin daily.    12/31/2018 at 2000  . KLOR-CON M10 10 MEQ tablet Take 10 mEq by mouth daily.   12/31/2018 at Unknown time  . magnesium oxide (MAG-OX) 400 MG tablet Take 400 mg by mouth daily.   12/31/2018 at Unknown time  . metFORMIN (GLUCOPHAGE) 500 MG tablet Take 500 mg by mouth 2 (two) times daily with a meal.   12/31/2018 at 1700  . mirtazapine (REMERON) 7.5 MG tablet Take 1 tablet (7.5 mg total) by mouth at bedtime. (Patient taking differently: Take 15 mg by mouth at bedtime. ) 30 tablet 0 12/31/2018 at 2000  . Multiple Vitamin (MULTIVITAMIN WITH MINERALS) TABS tablet Take 1 tablet by mouth daily.   12/31/2018 at Unknown  time  . QUEtiapine (SEROQUEL) 100 MG tablet Take 100 mg by mouth at bedtime.   12/31/2018 at 2000  . vitamin C (ASCORBIC ACID) 500 MG tablet Take 500 mg by mouth daily.   12/31/2018 at Unknown time  . warfarin (COUMADIN) 5 MG tablet Take 2.5-5 mg by mouth as directed. Take 2.5mg  one day, then alternates by taking 5mg  the next day   12/31/2018 at 2000  . zinc sulfate 220 (50 Zn) MG capsule Take 220 mg by mouth daily.   12/31/2018 at Unknown time  . BYDUREON 2 MG PEN Inject 2 mg into the skin once a week.  5 Not Taking at Unknown time  . clobetasol cream (TEMOVATE) 0.05 % Apply 1 application topically daily.        Assessment: Pharmacy consulted to dose warfarin in patient with history of DVT.  Patient's listed home dose of warfarin is alternating 2.5 mg and 5 mg every other day (5 mg on 11/26)  INR today therapeutic at 2.6   Goal of Therapy:  INR 2-3 Monitor platelets by anticoagulation protocol: Yes   Plan:  Warfarin 5 mg x 1 dose. Monitor daily INR and s/s of bleeding.  12/26, PharmD, MBA, BCGP Clinical Pharmacist  01/02/2019 7:42 AM

## 2019-01-03 DIAGNOSIS — G47421 Narcolepsy in conditions classified elsewhere with cataplexy: Secondary | ICD-10-CM

## 2019-01-03 LAB — BASIC METABOLIC PANEL
Anion gap: 11 (ref 5–15)
BUN: 21 mg/dL (ref 8–23)
CO2: 29 mmol/L (ref 22–32)
Calcium: 8.6 mg/dL — ABNORMAL LOW (ref 8.9–10.3)
Chloride: 95 mmol/L — ABNORMAL LOW (ref 98–111)
Creatinine, Ser: 0.92 mg/dL (ref 0.44–1.00)
GFR calc Af Amer: >60 mL/min (ref >60)
GFR calc non Af Amer: 59 mL/min — ABNORMAL LOW (ref >60)
Glucose, Bld: 184 mg/dL — ABNORMAL HIGH (ref 70–99)
Potassium: 3.9 mmol/L (ref 3.5–5.1)
Sodium: 135 mmol/L (ref 135–145)

## 2019-01-03 LAB — PROCALCITONIN: Procalcitonin: <0.1 ng/mL

## 2019-01-03 LAB — PROTIME-INR
INR: 2.2 — ABNORMAL HIGH (ref 0.8–1.2)
Prothrombin Time: 24.3 seconds — ABNORMAL HIGH (ref 11.4–15.2)

## 2019-01-03 LAB — GLUCOSE, CAPILLARY
Glucose-Capillary: 176 mg/dL — ABNORMAL HIGH (ref 70–99)
Glucose-Capillary: 261 mg/dL — ABNORMAL HIGH (ref 70–99)
Glucose-Capillary: 247 mg/dL — ABNORMAL HIGH (ref 70–99)
Glucose-Capillary: 292 mg/dL — ABNORMAL HIGH (ref 70–99)

## 2019-01-03 MED ORDER — WARFARIN SODIUM 2.5 MG PO TABS
2.5000 mg | ORAL_TABLET | Freq: Once | ORAL | Status: AC
  Administered 2019-01-03: 2.5 mg via ORAL
  Filled 2019-01-03: qty 1

## 2019-01-03 MED ORDER — TRAZODONE HCL 50 MG PO TABS
50.0000 mg | ORAL_TABLET | Freq: Every evening | ORAL | Status: DC | PRN
  Administered 2019-01-03 – 2019-01-06 (×2): 50 mg via ORAL
  Filled 2019-01-03 (×2): qty 1

## 2019-01-03 NOTE — Progress Notes (Signed)
ANTICOAGULATION CONSULT NOTE - follow up [place] for warfarin Indication: hx VTE  Allergies  Allergen Reactions  . Codeine Anxiety  . Compazine Anaphylaxis  . Other Anaphylaxis, Rash and Other (See Comments)    Uncoded Allergy. Allergen: INOVAR Uncoded Allergy. Allergen: ALL ADHESIVES Uncoded Allergy. Allergen: SILK SUTURE Uncoded Allergy. Allergen: merthiolate Thermasol  . Penicillins Shortness Of Breath and Rash    Has patient had a PCN reaction causing immediate rash, facial/tongue/throat swelling, SOB or lightheadedness with hypotension: Yes Has patient had a PCN reaction causing severe rash involving mucus membranes or skin necrosis: No Has patient had a PCN reaction that required hospitalization No Has patient had a PCN reaction occurring within the last 10 years: Yes If all of the above answers are "NO", then may proceed with Cephalosporin use.   Marland Kitchen Morphine And Related Other (See Comments)    Patient states that it makes her lose her state of mind.   . Demerol [Meperidine] Rash    Patient Measurements: Height: 5\' 8"  (172.7 cm) Weight: 236 lb 8.9 oz (107.3 kg) IBW/kg (Calculated) : 63.9 Vital Signs: Temp: 97.8 F (36.6 C) (11/29 0636) Temp Source: Oral (11/29 0636) BP: 135/56 (11/29 0636) Pulse Rate: 60 (11/29 0636)  Labs: Recent Labs    01/01/19 0555 01/01/19 0742 01/02/19 0406 01/03/19 0650  HGB 11.5*  --   --   --   HCT 37.2  --   --   --   PLT 292  --   --   --   LABPROT 27.7*  --  27.7* 24.3*  INR 2.6*  --  2.6* 2.2*  CREATININE 1.09*  --   --   --   TROPONINIHS 12 13  --   --     Estimated Creatinine Clearance: 53.7 mL/min (A) (by C-G formula based on SCr of 1.09 mg/dL (H)).   Medical History: Past Medical History:  Diagnosis Date  . Aortic stenosis   . Cataplexy   . Diastolic heart failure ([place])   . Essential hypertension   . GERD (gastroesophageal reflux disease)   . Hiatal hernia   . History of recurrent deep vein  thrombosis (DVT)   . Mitral stenosis   . Narcolepsy   . Neuropathy   . Renal disorder   . Type 2 diabetes mellitus (HCC)     Medications:  Medications Prior to Admission  Medication Sig Dispense Refill Last Dose  . acetaminophen (TYLENOL) 500 MG tablet Take 1,000 mg by mouth 2 (two) times daily as needed for mild pain.    12/31/2018 at Unknown time  . carvedilol (COREG) 3.125 MG tablet TAKE 1 TABLET BY MOUTH TWICE DAILY WITH A MEAL (Patient taking differently: Take 3.125 mg by mouth 2 (two) times daily with a meal. ) 60 tablet 6 12/31/2018 at 1700  . Cholecalciferol (VITAMIN D3) 3000 UNITS TABS Take 1 capsule by mouth daily.   12/31/2018 at Unknown time  . clomiPRAMINE (ANAFRANIL) 25 MG capsule Take 25 mg by mouth 4 (four) times daily.    12/31/2018 at 2000  . clonazePAM (KLONOPIN) 1 MG tablet Take 1 tablet (1 mg total) by mouth at bedtime.   12/31/2018 at 2000  . DULoxetine (CYMBALTA) 60 MG capsule Take 60 mg by mouth daily.     12/31/2018  . ferrous sulfate 325 (65 FE) MG tablet Take 325 mg by mouth 2 (two) times daily with a meal.    12/31/2018 at 2000  . furosemide (LASIX) 40 MG tablet  Take 20 mg by mouth 2 (two) times daily.   5 12/31/2018 at 1700  . gabapentin (NEURONTIN) 300 MG capsule Take 300 mg by mouth 2 (two) times daily.   12/31/2018 at 2000  . insulin glargine (LANTUS) 100 unit/mL SOPN Inject 24 Units into the skin daily.    12/31/2018 at 2000  . KLOR-CON M10 10 MEQ tablet Take 10 mEq by mouth daily.   12/31/2018 at Unknown time  . magnesium oxide (MAG-OX) 400 MG tablet Take 400 mg by mouth daily.   12/31/2018 at Unknown time  . metFORMIN (GLUCOPHAGE) 500 MG tablet Take 500 mg by mouth 2 (two) times daily with a meal.   12/31/2018 at 1700  . mirtazapine (REMERON) 7.5 MG tablet Take 1 tablet (7.5 mg total) by mouth at bedtime. (Patient taking differently: Take 15 mg by mouth at bedtime. ) 30 tablet 0 12/31/2018 at 2000  . Multiple Vitamin (MULTIVITAMIN WITH MINERALS) TABS tablet  Take 1 tablet by mouth daily.   12/31/2018 at Unknown time  . QUEtiapine (SEROQUEL) 100 MG tablet Take 100 mg by mouth at bedtime.   12/31/2018 at 2000  . vitamin C (ASCORBIC ACID) 500 MG tablet Take 500 mg by mouth daily.   12/31/2018 at Unknown time  . warfarin (COUMADIN) 5 MG tablet Take 2.5-5 mg by mouth as directed. Take 2.5mg  one day, then alternates by taking 5mg  the next day   12/31/2018 at 2000  . zinc sulfate 220 (50 Zn) MG capsule Take 220 mg by mouth daily.   12/31/2018 at Unknown time  . BYDUREON 2 MG PEN Inject 2 mg into the skin once a week.  5 Not Taking at Unknown time  . clobetasol cream (TEMOVATE) 0.05 % Apply 1 application topically daily.        Assessment: Pharmacy consulted to dose warfarin in patient with history of DVT.  Patient's listed home dose of warfarin is alternating 2.5 mg and 5 mg every other day (5 mg on 11/26)  INR today therapeutic at 2.2   Goal of Therapy:  INR 2-3 Monitor platelets by anticoagulation protocol: Yes   Plan:  Warfarin 2.5 mg x 1 dose. Monitor daily INR and s/s of bleeding.  12/26, PharmD, MBA, BCGP Clinical Pharmacist  01/03/2019 7:25 AM

## 2019-01-03 NOTE — Progress Notes (Signed)
PROGRESS NOTE    Jari FavreRena S Kuhnle  ZOX:[id number] DOB: Jun 10, 1940 DOA: 01/01/2019 PCP: Oval Linseyondiego, Richard, MD     Brief Narrative:  78 y.o. female with a past medical history significant for essential hypertension, chronic diastolic heart failure, aortic stenosis, cataplexy, morbid obesity, narcolepsy, history of recurrent DVTs, gastroesophageal reflux disease, type 2 diabetes mellitus with nephropathy, chronic kidney disease a stage IIIa and a recent admission secondary to Covid pneumonia; who presented to the hospital secondary to worsening shortness of breath and nonproductive cough.  Patient also expressed general malaise and increased lower extremity swelling.  She reports that for the last 2 days the breathing compromises to the point that her oxygen supplementation was not enough.  Patient reported sudden chest tightness sensation on the night prior to admission and expressed mild orthopnea.   Patient denies any nausea, vomiting, diarrhea, abdominal pain, fever, chills, chest pain, hematemesis, hematochezia, dysuria and hematuria.  Work-up in the ED demonstrated elevated BNP (even lower than prior levels), oxygen supplementation at the time the EMS picked her up to be in the mid 70s and transiently requiring nonrebreather mask.  Chest x-ray with multifocal infiltrates, vascular congestion and interstitial edema.  Blood work demonstrated normal WBCs, patient is afebrile, elevated fibrinogen of 564, CBG of 226, LDH 199,CRP 6.5, D-dimer 1.68, procalcitonin level < 0.10, repeat Covid test by PCR positive; negative high sensitive troponin X 2.  Blood cultures were taken, IV antibiotics broad-spectrum initiated and [facility] contacted to admit patient for further evaluation and management.   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 -Continue IV diuresis -Low-sodium diet, daily weights and strict I's and O's -Will start steroids with intention for slow tapering -Continue to wean down oxygen supplementation to baseline; she uses 2-3 L nasal cannula mainly at nighttime; but as per son reports since most recent Covid infection and admission, has been requiring the oxygen supplementation intermittently throughout the day as well. -Increase physical activity, wean down oxygen supplementation as tolerated and follow clinical response.  2-Acute on chronic diastolic heart failure -As mentioned above continue monitoring on telemetry -Continue IV diuresis -Follow daily weights, I's and O's and low-sodium diet.  3-Paroxysmal atrial fibrillation -Continue beta-blocker for rate control -Continue warfarin for secondary prevention; pharmacy dosing -INR within normal limits.  4-Essential hypertension -Overall stable -Continue current antihypertensive regimen -Follow vital signs -Continue heart healthy diet.  5-reflux disease -Continue PPI.  6-depression and/anxiety -Continue the use of Cymbalta, Seroquel and as needed Klonopin. -Overall mood is a stable.  7-physical deconditioning -Physical therapy has evaluated patient and is recommending skilled nursing facility at discharge for further rehabilitation. -Social worker will be made aware.  8-class II morbid obesity -Body mass index is 35.97 kg/m. -Low calorie diet, portion control and increase physical activity discussed with patient.  9-type 2 diabetes with nephropathy -A1c 8.2 -Continue sliding scale insulin and Lantus -Follow CBGs and adjust hypoglycemic regimen as needed -Initiation of his steroids might be responsible for fluctuation of her CBGs.  10-chronic kidney disease a stage IIIa -Stable and at baseline -Continue close monitoring of renal function while diuresing patient.  11-bilateral knee pain -secondary to OA -will  continue diclofenac gel -no swelling, no erythema on her joints appreciated.  DVT prophylaxis: Chronically on warfarin. Code Status: Full code Family Communication: No family at bedside. Disposition Plan: Remains inpatient, continue IV diuresis  and prednisone.  Patient will require skilled nursing facility at discharge for rehabilitation and further care.  Consultants:   None  Procedures:   See below for x-ray reports.  Antimicrobials:  Anti-infectives (From admission, onward)   Start     Dose/Rate Route Frequency Ordered Stop   01/02/19 0800  vancomycin (VANCOCIN) IVPB 1000 mg/200 mL premix  Status:  Discontinued     1,000 mg 200 mL/hr over 60 Minutes Intravenous Every 24 hours 01/01/19 0654 01/01/19 1332   01/01/19 0700  vancomycin (VANCOCIN) IVPB 1000 mg/200 mL premix     1,000 mg 200 mL/hr over 60 Minutes Intravenous Every 1 hr x 2 01/01/19 0654 01/01/19 1314   01/01/19 0645  aztreonam (AZACTAM) 2 g in sodium chloride 0.9 % 100 mL IVPB     2 g 200 mL/hr over 30 Minutes Intravenous  Once 01/01/19 0641 01/01/19 0915       Subjective: No fever, no chest pain, no nausea, no vomiting.  Still complaining of shortness of breath and orthopnea.  Requiring higher level of oxygen supplementation and feeling very weak and deconditioned.  Objective: Vitals:   01/03/19 0636 01/03/19 0728 01/03/19 0800 01/03/19 1500  BP: (!) 135/56 (!) 131/54  103/90  Pulse: 60 60  60  Resp:  (!) 26 (!) 22 (!) 24  Temp: 97.8 F (36.6 C)   97.8 F (36.6 C)  TempSrc: Oral   Oral  SpO2: 98% 99%  98%  Weight: 107.3 kg     Height:        Intake/Output Summary (Last 24 hours) at 01/03/2019 1610 Last data filed at 01/03/2019 1300 Gross per 24 hour  Intake 250 ml  Output 1000 ml  Net -750 ml   Filed Weights   01/01/19 1720 01/02/19 0500 01/03/19 0636  Weight: 105.9 kg 105 kg 107.3 kg    Examination: General exam: Alert, awake, oriented x 3, still complaining of shortness of breath,  requiring higher level of oxygen supplementation and having orthopnea symptoms.  No fever, no nausea, no vomiting.  Reports an improvement in her knees with the use of Voltaren gel. Respiratory system: Fine crackles at the bases, no wheezing, no using accessory muscle.  Currently on 4 L high flow nasal cannula supplementation. Cardiovascular system: RRR. No murmurs, rubs, gallops. Gastrointestinal system: Abdomen is obese, nondistended, soft and nontender. No organomegaly or masses felt. Normal bowel sounds heard. Central nervous system: Alert and oriented. No focal neurological deficits. Extremities: No cyanosis, no clubbing. Skin: No petechiae.  Psychiatry: Judgement and insight appear normal. Mood & affect appropriate.    Data Reviewed: I have personally reviewed following labs and imaging studies  CBC: Recent Labs  Lab 01/01/19 0555  WBC 8.8  NEUTROABS 6.9  HGB 11.5*  HCT 37.2  MCV 97.1  PLT 433   Basic Metabolic Panel: Recent Labs  Lab 01/01/19 0555 01/03/19 0650  NA 139 135  K 4.3 3.9  CL 99 95*  CO2 29 29  GLUCOSE 219* 184*  BUN 20 21  CREATININE 1.09* 0.92  CALCIUM 8.8* 8.6*   GFR: Estimated Creatinine Clearance: 63.6 mL/min (by C-G formula based on SCr of 0.92 mg/dL).   Liver Function Tests: Recent Labs  Lab 01/01/19 0555  AST 24  ALT 26  ALKPHOS 58  BILITOT 0.7  PROT 7.5  ALBUMIN 3.1*   Coagulation Profile: Recent Labs  Lab 01/01/19 0555 01/02/19 0406 01/03/19 0650  INR 2.6* 2.6* 2.2*   HbA1C: Recent Labs    01/01/19  0742  HGBA1C 8.2*   CBG: Recent Labs  Lab 01/02/19 1620 01/02/19 2104 01/03/19 0731 01/03/19 1056 01/03/19 1557  GLUCAP 184* 236* 176* 261* 247*   Anemia Panel: Recent Labs    01/01/19 1021  FERRITIN 195   Urine analysis:    Component Value Date/Time   COLORURINE AMBER (A) 12/08/2018 2046   APPEARANCEUR CLOUDY (A) 12/08/2018 2046   LABSPEC 1.019 12/08/2018 2046   PHURINE 5.0 12/08/2018 2046   GLUCOSEU  NEGATIVE 12/08/2018 2046   HGBUR NEGATIVE 12/08/2018 2046   BILIRUBINUR NEGATIVE 12/08/2018 2046   KETONESUR 5 (A) 12/08/2018 2046   PROTEINUR 30 (A) 12/08/2018 2046   UROBILINOGEN 0.2 09/05/2014 2015   NITRITE POSITIVE (A) 12/08/2018 2046   LEUKOCYTESUR TRACE (A) 12/08/2018 2046    Recent Results (from the past 240 hour(s))  Blood culture (routine x 2)     Status: None (Preliminary result)   Collection Time: 01/01/19  5:55 AM   Specimen: BLOOD  Result Value Ref Range Status   Specimen Description BLOOD RIGHT ARM  Final   Special Requests   Final    BOTTLES DRAWN AEROBIC AND ANAEROBIC Blood Culture adequate volume   Culture   Final    NO GROWTH 1 DAY Performed at [facility], [address] [place], [place] [address]    Report Status PENDING  Incomplete  SARS Coronavirus 2 by RT PCR (hospital order, performed in [facility] hospital lab) Nasopharyngeal Nasopharyngeal Swab     Status: Abnormal   Collection Time: 01/01/19  7:37 AM   Specimen: Nasopharyngeal Swab  Result Value Ref Range Status   SARS Coronavirus 2 POSITIVE (A) NEGATIVE Final    Comment: RESULT CALLED TO, READ BACK BY AND VERIFIED WITH: WHITE M. @ 0905 ON [id number] BY HENDERSON L. (NOTE) SARS-CoV-2 target nucleic acids are DETECTED SARS-CoV-2 RNA is generally detectable in upper respiratory specimens  during the acute phase of infection.  Positive results are indicative  of the presence of the identified virus, but do not rule out bacterial infection or co-infection with other pathogens not detected by the test.  Clinical correlation with patient history and  other diagnostic information is necessary to determine patient infection status.  The expected result is negative. Fact Sheet for Patients:   BoilerBrush.com.cy  Fact Sheet for Healthcare Providers:   https://pope.com/   This test is not yet approved or cleared by the [place] FDA and  has been  authorized for detection and/or diagnosis of SARS-CoV-2 by FDA under an Emergency Use Authorization (EUA).  This EUA will remain in effect (meaning this t est can be used) for the duration of  the COVID-19 declaration under Section 564(b)(1) of the Act, 21 U.S.C. section 360-bbb-3(b)(1), unless the authorization is terminated or revoked sooner. Performed at [facility], [address], [place], [place] [address]   Blood culture (routine x 2)     Status: None (Preliminary result)   Collection Time: 01/01/19  7:42 AM   Specimen: BLOOD RIGHT FOREARM  Result Value Ref Range Status   Specimen Description   Final    BLOOD RIGHT FOREARM BOTTLES DRAWN AEROBIC AND ANAEROBIC   Special Requests   Final    Blood Culture results may not be optimal due to an inadequate volume of blood received in culture bottles   Culture   Final    NO GROWTH < 24 HOURS Performed at [facility], [address], [place], [place] [address]    Report Status PENDING  Incomplete  MRSA PCR Screening     Status: None   Collection Time: 01/01/19  5:20 PM   Specimen: Nasal Mucosa; Nasopharyngeal  Result Value Ref Range Status   MRSA by PCR NEGATIVE NEGATIVE Final    Comment:        The GeneXpert MRSA Assay (FDA approved for NASAL specimens only), is one component of a comprehensive MRSA colonization surveillance program. It is not intended to diagnose MRSA infection nor to guide or monitor treatment for MRSA infections. Performed at [facility], [address], [place], [place] [address]      Radiology Studies: No results found. Scheduled Meds: . amLODipine  10 mg Oral Daily  . carvedilol  3.125 mg Oral BID WC  . Chlorhexidine Gluconate Cloth  6 each Topical Daily  . clomiPRAMINE  25 mg Oral QID  . clonazePAM  1 mg Oral QHS  . diclofenac Sodium  4 g Topical TID  . DULoxetine  60 mg Oral Daily  . furosemide  40 mg Intravenous Q12H  . gabapentin  300 mg Oral BID  . insulin aspart  0-15 Units Subcutaneous  TID WC  . insulin aspart  0-5 Units Subcutaneous QHS  . insulin glargine  15 Units Subcutaneous Daily  . mirtazapine  7.5 mg Oral QHS  . multivitamin with minerals  1 tablet Oral Daily  . nystatin   Topical TID  . potassium chloride  10 mEq Oral Daily  . predniSONE  40 mg Oral Q breakfast  . QUEtiapine  100 mg Oral QHS  . sodium chloride flush  3 mL Intravenous Q12H  . vitamin C  500 mg Oral Daily  . warfarin  2.5 mg Oral ONCE-1800  . Warfarin - Pharmacist Dosing Inpatient   Does not apply q1800  . zinc sulfate  220 mg Oral Daily   Continuous Infusions: . sodium chloride Stopped (01/01/19 1701)     LOS: 2 days    Time spent: 30 minutes.    Vassie Loll, MD [facility] Pager [phone]   01/03/2019, 4:10 PM

## 2019-01-03 NOTE — Progress Notes (Addendum)
Placed patient on HFNC for decreased saturation , She is on 6 liters oxygen with saturation around 90's . Suspect this is fluid related.

## 2019-01-03 NOTE — Progress Notes (Signed)
Patient's BP 131/54, Respirations are 26 on 6L HFNC with an O2  saturation of 99%. Patient's MEWS score 2, MD made awre

## 2019-01-04 LAB — PROTIME-INR
INR: 2.4 — ABNORMAL HIGH (ref 0.8–1.2)
Prothrombin Time: 25.9 seconds — ABNORMAL HIGH (ref 11.4–15.2)

## 2019-01-04 LAB — BASIC METABOLIC PANEL
Anion gap: 10 (ref 5–15)
BUN: 26 mg/dL — ABNORMAL HIGH (ref 8–23)
CO2: 32 mmol/L (ref 22–32)
Calcium: 8.7 mg/dL — ABNORMAL LOW (ref 8.9–10.3)
Chloride: 95 mmol/L — ABNORMAL LOW (ref 98–111)
Creatinine, Ser: 0.94 mg/dL (ref 0.44–1.00)
GFR calc Af Amer: >60 mL/min (ref >60)
GFR calc non Af Amer: 58 mL/min — ABNORMAL LOW (ref >60)
Glucose, Bld: 173 mg/dL — ABNORMAL HIGH (ref 70–99)
Potassium: 3.5 mmol/L (ref 3.5–5.1)
Sodium: 137 mmol/L (ref 135–145)

## 2019-01-04 LAB — GLUCOSE, CAPILLARY
Glucose-Capillary: 157 mg/dL — ABNORMAL HIGH (ref 70–99)
Glucose-Capillary: 218 mg/dL — ABNORMAL HIGH (ref 70–99)
Glucose-Capillary: 285 mg/dL — ABNORMAL HIGH (ref 70–99)
Glucose-Capillary: 377 mg/dL — ABNORMAL HIGH (ref 70–99)

## 2019-01-04 MED ORDER — POLYETHYLENE GLYCOL 3350 17 G PO PACK
17.0000 g | PACK | Freq: Every day | ORAL | Status: DC
  Administered 2019-01-04 – 2019-01-07 (×4): 17 g via ORAL
  Filled 2019-01-04 (×4): qty 1

## 2019-01-04 MED ORDER — WARFARIN SODIUM 5 MG PO TABS
5.0000 mg | ORAL_TABLET | Freq: Once | ORAL | Status: AC
  Administered 2019-01-04: 5 mg via ORAL
  Filled 2019-01-04: qty 1

## 2019-01-04 MED ORDER — FLEET ENEMA 7-19 GM/118ML RE ENEM
1.0000 | ENEMA | Freq: Once | RECTAL | Status: AC
  Administered 2019-01-04: 1 via RECTAL

## 2019-01-04 MED ORDER — DOCUSATE SODIUM 100 MG PO CAPS
100.0000 mg | ORAL_CAPSULE | Freq: Two times a day (BID) | ORAL | Status: DC
  Administered 2019-01-04 – 2019-01-07 (×7): 100 mg via ORAL
  Filled 2019-01-04 (×7): qty 1

## 2019-01-04 NOTE — TOC Progression Note (Signed)
Transition of Care [facility]) - Progression Note    Patient Details  Name: Rebecca [name] MRN: [id number] Date of Birth: May 14, 1940  Transition of Care [facility]) CM/SW Contact  Shade Flood, LCSW Phone Number: 01/04/2019, 2:43 PM  Clinical Narrative:     PT recommending SNF. Discussed with pt's granddaughter on the phone due to pt's positive covid status. Gdtr states that pt does not want to go back to [place]. Discussed other options for pt's with positive covid status. Family only interested in [facility] referral. They may elect to just take pt home with [facility].  Pt was referred to [facility] when she discharged from [facility] but no one had been to see pt from [facility] yet prior to this admission at [facility].  TOC will follow and continue to assist with dc planning.  Expected Discharge Plan: [place] Barriers to Discharge: Continued Medical Work up  Expected Discharge Plan and Services Expected Discharge Plan: [place]   Discharge Planning Services: CM Consult   Living arrangements for the past 2 months: Single Family Home                                       Social Determinants of Health (SDOH) Interventions    Readmission Risk Interventions Readmission Risk Prevention Plan 01/04/2019  Transportation Screening Complete  HRI or [place] Complete  Social Work Consult for [place] Planning/Counseling Complete  Palliative Care Screening Not Applicable  Medication Review Press photographer) Complete  Some recent data might be hidden

## 2019-01-04 NOTE — Progress Notes (Signed)
ANTICOAGULATION CONSULT NOTE - follow up Consult  Pharmacy Consult for warfarin Indication: hx VTE  Allergies  Allergen Reactions  . Codeine Anxiety  . Compazine Anaphylaxis  . Other Anaphylaxis, Rash and Other (See Comments)    Uncoded Allergy. Allergen: INOVAR Uncoded Allergy. Allergen: ALL ADHESIVES Uncoded Allergy. Allergen: SILK SUTURE Uncoded Allergy. Allergen: merthiolate Thermasol  . Penicillins Shortness Of Breath and Rash    Has patient had a PCN reaction causing immediate rash, facial/tongue/throat swelling, SOB or lightheadedness with hypotension: Yes Has patient had a PCN reaction causing severe rash involving mucus membranes or skin necrosis: No Has patient had a PCN reaction that required hospitalization No Has patient had a PCN reaction occurring within the last 10 years: Yes If all of the above answers are "NO", then may proceed with Cephalosporin use.   Marland Kitchen Morphine And Related Other (See Comments)    Patient states that it makes her lose her state of mind.   . Demerol [Meperidine] Rash    Patient Measurements: Height: 5\' 8"  (172.7 cm) Weight: 236 lb 5.3 oz (107.2 kg) IBW/kg (Calculated) : 63.9 Vital Signs: Temp: 97.5 F (36.4 C) (11/30 0621) Temp Source: Oral (11/30 0621) BP: 149/66 (11/30 0621) Pulse Rate: 60 (11/30 0621)  Labs: Recent Labs    01/02/19 0406 01/03/19 0650 01/04/19 0522 01/04/19 0619  LABPROT 27.7* 24.3*  --  25.9*  INR 2.6* 2.2*  --  2.4*  CREATININE  --  0.92 0.94  --     Estimated Creatinine Clearance: 62.2 mL/min (by C-G formula based on SCr of 0.94 mg/dL).   Medical History: Past Medical History:  Diagnosis Date  . Aortic stenosis   . Cataplexy   . Diastolic heart failure (HCC)   . Essential hypertension   . GERD (gastroesophageal reflux disease)   . Hiatal hernia   . History of recurrent deep vein thrombosis (DVT)   . Mitral stenosis   . Narcolepsy   . Neuropathy   . Renal disorder   . Type 2 diabetes mellitus  (HCC)     Medications:  Medications Prior to Admission  Medication Sig Dispense Refill Last Dose  . acetaminophen (TYLENOL) 500 MG tablet Take 1,000 mg by mouth 2 (two) times daily as needed for mild pain.    12/31/2018 at Unknown time  . carvedilol (COREG) 3.125 MG tablet TAKE 1 TABLET BY MOUTH TWICE DAILY WITH A MEAL (Patient taking differently: Take 3.125 mg by mouth 2 (two) times daily with a meal. ) 60 tablet 6 12/31/2018 at 1700  . Cholecalciferol (VITAMIN D3) 3000 UNITS TABS Take 1 capsule by mouth daily.   12/31/2018 at Unknown time  . clomiPRAMINE (ANAFRANIL) 25 MG capsule Take 25 mg by mouth 4 (four) times daily.    12/31/2018 at 2000  . clonazePAM (KLONOPIN) 1 MG tablet Take 1 tablet (1 mg total) by mouth at bedtime.   12/31/2018 at 2000  . DULoxetine (CYMBALTA) 60 MG capsule Take 60 mg by mouth daily.     12/31/2018  . ferrous sulfate 325 (65 FE) MG tablet Take 325 mg by mouth 2 (two) times daily with a meal.    12/31/2018 at 2000  . furosemide (LASIX) 40 MG tablet Take 20 mg by mouth 2 (two) times daily.   5 12/31/2018 at 1700  . gabapentin (NEURONTIN) 300 MG capsule Take 300 mg by mouth 2 (two) times daily.   12/31/2018 at 2000  . insulin glargine (LANTUS) 100 unit/mL SOPN Inject 24 Units into the skin  daily.    12/31/2018 at 2000  . KLOR-CON M10 10 MEQ tablet Take 10 mEq by mouth daily.   12/31/2018 at Unknown time  . magnesium oxide (MAG-OX) 400 MG tablet Take 400 mg by mouth daily.   12/31/2018 at Unknown time  . metFORMIN (GLUCOPHAGE) 500 MG tablet Take 500 mg by mouth 2 (two) times daily with a meal.   12/31/2018 at 1700  . mirtazapine (REMERON) 7.5 MG tablet Take 1 tablet (7.5 mg total) by mouth at bedtime. (Patient taking differently: Take 15 mg by mouth at bedtime. ) 30 tablet 0 12/31/2018 at 2000  . Multiple Vitamin (MULTIVITAMIN WITH MINERALS) TABS tablet Take 1 tablet by mouth daily.   12/31/2018 at Unknown time  . QUEtiapine (SEROQUEL) 100 MG tablet Take 100 mg by mouth  at bedtime.   12/31/2018 at 2000  . vitamin C (ASCORBIC ACID) 500 MG tablet Take 500 mg by mouth daily.   12/31/2018 at Unknown time  . warfarin (COUMADIN) 5 MG tablet Take 2.5-5 mg by mouth as directed. Take 2.5mg  one day, then alternates by taking 5mg  the next day   12/31/2018 at 2000  . zinc sulfate 220 (50 Zn) MG capsule Take 220 mg by mouth daily.   12/31/2018 at Unknown time  . BYDUREON 2 MG PEN Inject 2 mg into the skin once a week.  5 Not Taking at Unknown time  . clobetasol cream (TEMOVATE) 0.53 % Apply 1 application topically daily.        Assessment: Pharmacy consulted to dose warfarin in patient with history of DVT.  Patient's listed home dose of warfarin is alternating 2.5 mg and 5 mg every other day (5 mg on 11/26)  INR today therapeutic at 2.4  Goal of Therapy:  INR 2-3 Monitor platelets by anticoagulation protocol: Yes   Plan:  Warfarin 5 mg x 1 dose. Monitor daily INR and s/s of bleeding.  Isac Sarna, BS Pharm D, [place] Clinical Pharmacist Pager [phone] 01/04/2019 8:53 AM

## 2019-01-04 NOTE — Progress Notes (Signed)
C/O constipation today.  Received order for scheduled miralax and colace.

## 2019-01-04 NOTE — Progress Notes (Signed)
PROGRESS NOTE    Rebecca [name]  JKD:[id number] DOB: May 20, 1940 DOA: 01/01/2019 PCP: Lucia Gaskins, MD     Brief Narrative:  78 y.o. female with a past medical history significant for essential hypertension, chronic diastolic heart failure, aortic stenosis, cataplexy, morbid obesity, narcolepsy, history of recurrent DVTs, gastroesophageal reflux disease, type 2 diabetes mellitus with nephropathy, chronic kidney disease a stage IIIa and a recent admission secondary to Covid pneumonia; who presented to the hospital secondary to worsening shortness of breath and nonproductive cough.  Patient also expressed general malaise and increased lower extremity swelling.  She reports that for the last 2 days the breathing compromises to the point that her oxygen supplementation was not enough.  Patient reported sudden chest tightness sensation on the night prior to admission and expressed mild orthopnea.   Patient denies any nausea, vomiting, diarrhea, abdominal pain, fever, chills, chest pain, hematemesis, hematochezia, dysuria and hematuria.  Work-up in the ED demonstrated elevated BNP (even lower than prior levels), oxygen supplementation at the time the EMS picked her up to be in the mid 70s and transiently requiring nonrebreather mask.  Chest x-ray with multifocal infiltrates, vascular congestion and interstitial edema.  Blood work demonstrated normal WBCs, patient is afebrile, elevated fibrinogen of 564, CBG of 226, LDH 199,CRP 6.5, D-dimer 1.68, procalcitonin level < 0.10, repeat Covid test by PCR positive; negative high sensitive troponin X 2.  Blood cultures were taken, IV antibiotics broad-spectrum initiated and [facility] contacted to admit patient for further evaluation and management.   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 -Continue IV diuresis -Low-sodium diet, daily weights and strict I's and O's -Will start steroids with intention for slow tapering -Continue to wean down oxygen supplementation to baseline; she uses 2-3 L nasal cannula mainly at nighttime; but as per son reports since most recent Covid infection and admission, has been requiring the oxygen supplementation intermittently throughout the day as well. -Increase physical activity, wean down oxygen supplementation as tolerated and follow clinical response.  2-Acute on chronic diastolic heart failure -As mentioned above continue monitoring on telemetry -Continue IV diuresis -Follow daily weights, I's and O's and low-sodium diet.  3-Paroxysmal atrial fibrillation -Continue beta-blocker for rate control -Continue warfarin for secondary prevention; pharmacy dosing -INR within normal limits.  4-Essential hypertension -Overall stable -Continue current antihypertensive regimen -Follow vital signs -Continue heart healthy diet.  5-reflux disease -Continue PPI.  6-depression and/anxiety -Continue the use of Cymbalta, Seroquel and as needed Klonopin. -Overall mood is a stable.  7-physical deconditioning -Physical therapy has evaluated patient and is recommending skilled nursing facility at discharge for further rehabilitation. -Social worker will be made aware.  8-class II morbid obesity -Body mass index is 35.93 kg/m. -Low calorie diet, portion control and increase physical activity discussed with patient.  9-type 2 diabetes with nephropathy -A1c 8.2 -Continue sliding scale insulin and Lantus -Follow CBGs and adjust hypoglycemic regimen as needed -Initiation of his steroids might be responsible for fluctuation of her CBGs.  10-chronic kidney disease a stage IIIa -Stable and at baseline -Continue close monitoring of renal function while diuresing patient.  11-bilateral knee pain -secondary to OA -will  continue diclofenac gel -no swelling, no erythema on her joints appreciated.  DVT prophylaxis: Chronically on warfarin. Code Status: Full code Family Communication: No family at bedside. Disposition Plan: Remains inpatient, continue IV diuresis  and prednisone.  Patient will require skilled nursing facility at discharge for rehabilitation and further care.  Consultants:   None  Procedures:   See below for x-ray reports.  Antimicrobials:  Anti-infectives (From admission, onward)   Start     Dose/Rate Route Frequency Ordered Stop   01/02/19 0800  vancomycin (VANCOCIN) IVPB 1000 mg/200 mL premix  Status:  Discontinued     1,000 mg 200 mL/hr over 60 Minutes Intravenous Every 24 hours 01/01/19 0654 01/01/19 1332   01/01/19 0700  vancomycin (VANCOCIN) IVPB 1000 mg/200 mL premix     1,000 mg 200 mL/hr over 60 Minutes Intravenous Every 1 hr x 2 01/01/19 0654 01/01/19 1314   01/01/19 0645  aztreonam (AZACTAM) 2 g in sodium chloride 0.9 % 100 mL IVPB     2 g 200 mL/hr over 30 Minutes Intravenous  Once 01/01/19 0641 01/01/19 0915       Subjective: No fever, no chest pain, no nausea, no vomiting.  Still short of breath on exertion, having findings of fluid overload on physical exam and requiring around 4 L nasal cannula supplementation.  No acute distress.  Patient reports feeling weak, easily fatigued and deconditioned.  Objective: Vitals:   01/03/19 1500 01/03/19 2140 01/04/19 0500 01/04/19 0621  BP: 103/90 102/67  (!) 149/66  Pulse: 60 (!) 59  60  Resp: (!) 24 20  20   Temp: 97.8 F (36.6 C) 97.9 F (36.6 C)  (!) 97.5 F (36.4 C)  TempSrc: Oral Oral  Oral  SpO2: 98% 98%    Weight:   107.2 kg   Height:        Intake/Output Summary (Last 24 hours) at 01/04/2019 1252 Last data filed at 01/04/2019 0700 Gross per 24 hour  Intake 200 ml  Output 1000 ml  Net -800 ml   Filed Weights   01/02/19 0500 01/03/19 0636 01/04/19 0500  Weight: 105 kg 107.3 kg 107.2 kg     Examination: General exam: Alert, awake, oriented x 3; still requiring around 4 L nasal cannula supplementation and expressing shortness of breath on exertion.  She is weak, easily fatigued and deconditioned.  No chest pain, no nausea, no vomiting, no palpitations.  Still with signs of fluid overload on exam. Respiratory system: Decreased breath sounds at the bases, no wheezing, no using accessory muscles.  Normal effort. Cardiovascular system: RRR. No murmurs, rubs, gallops. Gastrointestinal system: Abdomen is obese, nondistended, soft and nontender. No organomegaly or masses felt. Normal bowel sounds heard. Central nervous system: Alert and oriented. No focal neurological deficits. Extremities: No cyanosis or clubbing; trace-1+ edema bilaterally, no joint swelling. Skin: No petechiae. Psychiatry: Judgement and insight appear normal. Mood & affect appropriate.    Data Reviewed: I have personally reviewed following labs and imaging studies  CBC: Recent Labs  Lab 01/01/19 0555  WBC 8.8  NEUTROABS 6.9  HGB 11.5*  HCT 37.2  MCV 97.1  PLT 292   Basic Metabolic Panel: Recent Labs  Lab 01/01/19 0555 01/03/19 0650 01/04/19 0522  NA 139 135 137  K 4.3 3.9 3.5  CL 99 95* 95*  CO2 29 29 32  GLUCOSE 219* 184* 173*  BUN 20 21 26*  CREATININE 1.09* 0.92 0.94  CALCIUM 8.8* 8.6* 8.7*   GFR: Estimated Creatinine Clearance: 62.2 mL/min (by C-G formula based on SCr of 0.94 mg/dL).   Liver Function Tests: Recent Labs  Lab 01/01/19 0555  AST 24  ALT 26  ALKPHOS 58  BILITOT 0.7  PROT 7.5  ALBUMIN 3.1*   Coagulation Profile: Recent Labs  Lab 01/01/19 0555 01/02/19 0406 01/03/19 0650 01/04/19 0619  INR 2.6* 2.6* 2.2* 2.4*   CBG: Recent Labs  Lab 01/03/19 1056 01/03/19 1557 01/03/19 2141 01/04/19 0742 01/04/19 1118  GLUCAP 261* 247* 292* 157* 218*   Urine analysis:    Component Value Date/Time   COLORURINE AMBER (A) 12/08/2018 2046   APPEARANCEUR CLOUDY (A)  12/08/2018 2046   LABSPEC 1.019 12/08/2018 2046   PHURINE 5.0 12/08/2018 2046   GLUCOSEU NEGATIVE 12/08/2018 2046   HGBUR NEGATIVE 12/08/2018 2046   BILIRUBINUR NEGATIVE 12/08/2018 2046   KETONESUR 5 (A) 12/08/2018 2046   PROTEINUR 30 (A) 12/08/2018 2046   UROBILINOGEN 0.2 09/05/2014 2015   NITRITE POSITIVE (A) 12/08/2018 2046   LEUKOCYTESUR TRACE (A) 12/08/2018 2046    Recent Results (from the past 240 hour(s))  Blood culture (routine x 2)     Status: None (Preliminary result)   Collection Time: 01/01/19  5:55 AM   Specimen: BLOOD  Result Value Ref Range Status   Specimen Description BLOOD RIGHT ARM  Final   Special Requests   Final    BOTTLES DRAWN AEROBIC AND ANAEROBIC Blood Culture adequate volume   Culture   Final    NO GROWTH 3 DAYS Performed at [facility], [address], [place], [place] [address]    Report Status PENDING  Incomplete  SARS Coronavirus 2 by RT PCR (hospital order, performed in [facility] hospital lab) Nasopharyngeal Nasopharyngeal Swab     Status: Abnormal   Collection Time: 01/01/19  7:37 AM   Specimen: Nasopharyngeal Swab  Result Value Ref Range Status   SARS Coronavirus 2 POSITIVE (A) NEGATIVE Final    Comment: RESULT CALLED TO, READ BACK BY AND VERIFIED WITH: WHITE M. @ 0905 ON [id number] BY HENDERSON L. (NOTE) SARS-CoV-2 target nucleic acids are DETECTED SARS-CoV-2 RNA is generally detectable in upper respiratory specimens  during the acute phase of infection.  Positive results are indicative  of the presence of the identified virus, but do not rule out bacterial infection or co-infection with other pathogens not detected by the test.  Clinical correlation with patient history and  other diagnostic information is necessary to determine patient infection status.  The expected result is negative. Fact Sheet for Patients:   BoilerBrush.com.cy  Fact Sheet for Healthcare Providers:   https://pope.com/    This test is not yet approved or cleared by the [place] FDA and  has been authorized for detection and/or diagnosis of SARS-CoV-2 by FDA under an Emergency Use Authorization (EUA).  This EUA will remain in effect (meaning this t est can be used) for the duration of  the COVID-19 declaration under Section 564(b)(1) of the Act, 21 U.S.C. section 360-bbb-3(b)(1), unless the authorization is terminated or revoked sooner. Performed at [facility], [address] [place], [place] [address]   Blood culture (routine x 2)     Status: None (Preliminary result)   Collection Time: 01/01/19  7:42 AM   Specimen: BLOOD RIGHT FOREARM  Result Value Ref Range Status   Specimen Description   Final    BLOOD RIGHT FOREARM BOTTLES DRAWN AEROBIC AND ANAEROBIC   Special Requests   Final    Blood Culture results may not be optimal due to an inadequate volume of blood received in culture bottles   Culture   Final    NO GROWTH 3 DAYS Performed at [facility], [address], [place], [place] [address]  Report Status PENDING  Incomplete  MRSA PCR Screening     Status: None   Collection Time: 01/01/19  5:20 PM   Specimen: Nasal Mucosa; Nasopharyngeal  Result Value Ref Range Status   MRSA by PCR NEGATIVE NEGATIVE Final    Comment:        The GeneXpert MRSA Assay (FDA approved for NASAL specimens only), is one component of a comprehensive MRSA colonization surveillance program. It is not intended to diagnose MRSA infection nor to guide or monitor treatment for MRSA infections. Performed at [facility], [address] [place], [place] [address]      Radiology Studies: No results found. Scheduled Meds: . amLODipine  10 mg Oral Daily  . carvedilol  3.125 mg Oral BID WC  . Chlorhexidine Gluconate Cloth  6 each Topical Daily  . clomiPRAMINE  25 mg Oral QID  . clonazePAM  1 mg Oral QHS  . diclofenac Sodium  4 g Topical TID  . docusate sodium  100 mg Oral BID  . DULoxetine  60 mg Oral Daily   . furosemide  40 mg Intravenous Q12H  . gabapentin  300 mg Oral BID  . insulin aspart  0-15 Units Subcutaneous TID WC  . insulin aspart  0-5 Units Subcutaneous QHS  . insulin glargine  15 Units Subcutaneous Daily  . mirtazapine  7.5 mg Oral QHS  . multivitamin with minerals  1 tablet Oral Daily  . nystatin   Topical TID  . polyethylene glycol  17 g Oral Daily  . potassium chloride  10 mEq Oral Daily  . predniSONE  40 mg Oral Q breakfast  . QUEtiapine  100 mg Oral QHS  . sodium chloride flush  3 mL Intravenous Q12H  . vitamin C  500 mg Oral Daily  . warfarin  5 mg Oral Once  . Warfarin - Pharmacist Dosing Inpatient   Does not apply q1800  . zinc sulfate  220 mg Oral Daily   Continuous Infusions: . sodium chloride Stopped (01/01/19 1701)     LOS: 3 days    Time spent: 30 minutes.    Vassie Loll, MD [facility] Pager [phone]   01/04/2019, 12:52 PM

## 2019-01-04 NOTE — [place] FL2 (Signed)
[place] MEDICAID FL2 LEVEL OF CARE SCREENING TOOL     IDENTIFICATION  Patient Name: Rebecca [name] Birthdate: April 06, 1940 Sex: female Admission Date (Current Location): 01/01/2019  [facility] and [place] Number:  [employer] and Address:  [facility],  [address] [place] [address]      Provider Number: [id number]  Attending Physician Name and Address:  Vassie Loll, MD  Relative Name and Phone Number:       Current Level of Care: Hospital Recommended Level of Care: Skilled Nursing Facility Prior Approval Number:    Date Approved/Denied:   PASRR Number: [id number]  Discharge Plan: SNF    Current Diagnoses: Patient Active Problem List   Diagnosis Date Noted  . Acute on chronic respiratory failure with hypoxia (HCC) 01/01/2019  . Paroxysmal atrial fibrillation (HCC) 12/09/2018  . Acute on chronic respiratory failure (HCC) 12/09/2018  . Pneumonia due to COVID-19 virus 12/08/2018  . History of recurrent deep vein thrombosis (DVT)   . Type 2 diabetes mellitus (HCC)   . Lobar pneumonia (HCC) 09/05/2018  . Acute on chronic diastolic CHF (congestive heart failure) (HCC) 09/05/2018  . CHF (congestive heart failure) (HCC) 09/04/2018  . HCAP (healthcare-associated pneumonia) 03/29/2017  . Cellulitis 03/03/2017  . Type 2 diabetes mellitus with other specified complication (HCC) 03/03/2017  . CKD (chronic kidney disease), stage III 03/03/2017  . Status post placement of cardiac pacemaker 10/29/2016  . Pulmonary edema 10/23/2016  . Acute respiratory failure with hypoxia (HCC) 02/22/2015  . CAP (community acquired pneumonia) 02/22/2015  . Fall at home 09/21/2014  . Mild aortic stenosis 09/08/2014  . Mild to moderate  mitral stenosis 09/08/2014  . Chronic anticoagulation-Coumadin 09/08/2014  . Sepsis (HCC) 09/05/2014  . Elevated troponin 09/05/2014  . Lower leg DVT (deep venous thromboembolism), chronic (HCC) 09/05/2014  . Essential hypertension  09/05/2014  . Depression 09/05/2014  . Poorly controlled diabetes mellitus (HCC) 09/05/2014  . Hypomagnesemia 09/05/2014  . Hyponatremia 09/05/2014  . Pyelonephritis 09/05/2014  . Chronic diastolic CHF (congestive heart failure) (HCC)   . 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 GERD (gastroesophageal reflux disease) 07/08/2011  . Gastroparesis 07/08/2011  . IDA (iron deficiency anemia) 07/08/2011  . History of colonic polyps 07/08/2011  . 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 NAFLD (nonalcoholic fatty liver disease) 25/36/6440  . Obesity 07/08/2011    Orientation RESPIRATION BLADDER Height & Weight     Self, Time, Situation, Place  O2 Incontinent Weight: 236 lb 5.3 oz (107.2 kg) Height:  5\' 8"  (172.7 cm)  BEHAVIORAL SYMPTOMS/MOOD NEUROLOGICAL BOWEL NUTRITION STATUS      Incontinent Diet(see dc summary)  AMBULATORY STATUS COMMUNICATION OF NEEDS Skin   Extensive Assist Verbally Normal                       Personal Care Assistance Level of Assistance  Bathing, Feeding, Dressing Bathing Assistance: Maximum assistance Feeding assistance: Independent Dressing Assistance: Maximum assistance     Functional Limitations Info  Sight, Hearing, Speech Sight Info: Adequate Hearing Info: Adequate Speech Info: Adequate    SPECIAL CARE FACTORS FREQUENCY  PT (By licensed PT), OT (By licensed OT)     PT Frequency: 5 times week OT Frequency: 3 times week            Contractures Contractures Info: Not present    Additional Factors Info    Code Status Info: Full Allergies Info: Compazine, Demerol, Morphine, Penicillin, Codeine  Current Medications (01/04/2019):  This is the current hospital active medication list Current Facility-Administered Medications  Medication Dose Route Frequency Provider Last Rate Last Dose  . 0.9 %  sodium chloride infusion  250 mL Intravenous PRN Vassie Loll, MD   Stopped at 01/01/19 1701  .  acetaminophen (TYLENOL) tablet 650 mg  650 mg Oral Q6H PRN Vassie Loll, MD   650 mg at 01/04/19 1425  . amLODipine (NORVASC) tablet 10 mg  10 mg Oral Daily Vassie Loll, MD   10 mg at 01/04/19 0925  . carvedilol (COREG) tablet 3.125 mg  3.125 mg Oral BID WC Vassie Loll, MD   3.125 mg at 01/04/19 0924  . Chlorhexidine Gluconate Cloth 2 % PADS 6 each  6 each Topical Daily Vassie Loll, MD   6 each at 01/04/19 [phone]  . clomiPRAMINE (ANAFRANIL) capsule 25 mg  25 mg Oral QID Vassie Loll, MD   25 mg at 01/04/19 1426  . clonazePAM (KLONOPIN) tablet 1 mg  1 mg Oral QHS Vassie Loll, MD   1 mg at 01/03/19 2245  . diclofenac Sodium (VOLTAREN) 1 % topical gel 4 g  4 g Topical TID Vassie Loll, MD   4 g at 01/04/19 0926  . docusate sodium (COLACE) capsule 100 mg  100 mg Oral BID Vassie Loll, MD   100 mg at 01/04/19 1425  . DULoxetine (CYMBALTA) DR capsule 60 mg  60 mg Oral Daily Vassie Loll, MD   60 mg at 01/04/19 0925  . furosemide (LASIX) injection 40 mg  40 mg Intravenous Lawerance Cruel, MD   40 mg at 01/04/19 3435  . gabapentin (NEURONTIN) capsule 300 mg  300 mg Oral BID Vassie Loll, MD   300 mg at 01/04/19 0925  . insulin aspart (novoLOG) injection 0-15 Units  0-15 Units Subcutaneous TID WC Vassie Loll, MD   5 Units at 01/04/19 1207  . insulin aspart (novoLOG) injection 0-5 Units  0-5 Units Subcutaneous QHS Vassie Loll, MD   3 Units at 01/03/19 2244  . insulin glargine (LANTUS) injection 15 Units  15 Units Subcutaneous Daily Vassie Loll, MD   15 Units at 01/04/19 1425  . mirtazapine (REMERON) tablet 7.5 mg  7.5 mg Oral QHS Vassie Loll, MD   7.5 mg at 01/03/19 2243  . multivitamin with minerals tablet 1 tablet  1 tablet Oral Daily Vassie Loll, MD   1 tablet at 01/04/19 [phone]  . nystatin (MYCOSTATIN/NYSTOP) topical powder   Topical TID Vassie Loll, MD      . polyethylene glycol (MIRALAX / GLYCOLAX) packet 17 g  17 g Oral Daily Vassie Loll, MD   17 g at  01/04/19 1425  . potassium chloride (KLOR-CON) CR tablet 10 mEq  10 mEq Oral Daily Vassie Loll, MD   10 mEq at 01/04/19 0925  . predniSONE (DELTASONE) tablet 40 mg  40 mg Oral Q breakfast Vassie Loll, MD   40 mg at 01/04/19 6837  . QUEtiapine (SEROQUEL) tablet 100 mg  100 mg Oral QHS Vassie Loll, MD   100 mg at 01/03/19 2245  . sodium chloride flush (NS) 0.9 % injection 3 mL  3 mL Intravenous Q12H Vassie Loll, MD   3 mL at 01/04/19 0927  . sodium chloride flush (NS) 0.9 % injection 3 mL  3 mL Intravenous PRN Vassie Loll, MD      . traZODone (DESYREL) tablet 50 mg  50 mg Oral QHS PRN Macon Large, NP   50 mg at 01/03/19  2242  . vitamin C (ASCORBIC ACID) tablet 500 mg  500 mg Oral Daily Barton Dubois, MD   500 mg at 01/04/19 9381  . warfarin (COUMADIN) tablet 5 mg  5 mg Oral Once Barton Dubois, MD      . Warfarin - Pharmacist Dosing Inpatient   Does not apply q1800 Barton Dubois, MD      . zinc sulfate capsule 220 mg  220 mg Oral Daily Barton Dubois, MD   220 mg at 01/04/19 8299     Discharge Medications: Please see discharge summary for a list of discharge medications.  Relevant Imaging Results:  Relevant Lab Results:   Additional Information SSN: [place], [place]

## 2019-01-05 DIAGNOSIS — Z515 Encounter for palliative care: Secondary | ICD-10-CM

## 2019-01-05 DIAGNOSIS — Z7189 Other specified counseling: Secondary | ICD-10-CM

## 2019-01-05 LAB — GLUCOSE, CAPILLARY
Glucose-Capillary: 235 mg/dL — ABNORMAL HIGH (ref 70–99)
Glucose-Capillary: 259 mg/dL — ABNORMAL HIGH (ref 70–99)
Glucose-Capillary: 321 mg/dL — ABNORMAL HIGH (ref 70–99)
Glucose-Capillary: 256 mg/dL — ABNORMAL HIGH (ref 70–99)

## 2019-01-05 LAB — BASIC METABOLIC PANEL
Anion gap: 12 (ref 5–15)
BUN: 34 mg/dL — ABNORMAL HIGH (ref 8–23)
CO2: 33 mmol/L — ABNORMAL HIGH (ref 22–32)
Calcium: 8.6 mg/dL — ABNORMAL LOW (ref 8.9–10.3)
Chloride: 94 mmol/L — ABNORMAL LOW (ref 98–111)
Creatinine, Ser: 1.06 mg/dL — ABNORMAL HIGH (ref 0.44–1.00)
GFR calc Af Amer: 58 mL/min — ABNORMAL LOW (ref >60)
GFR calc non Af Amer: 50 mL/min — ABNORMAL LOW (ref >60)
Glucose, Bld: 177 mg/dL — ABNORMAL HIGH (ref 70–99)
Potassium: 3.9 mmol/L (ref 3.5–5.1)
Sodium: 139 mmol/L (ref 135–145)

## 2019-01-05 LAB — PROTIME-INR
INR: 2.4 — ABNORMAL HIGH (ref 0.8–1.2)
Prothrombin Time: 26.2 seconds — ABNORMAL HIGH (ref 11.4–15.2)

## 2019-01-05 MED ORDER — INSULIN ASPART 100 UNIT/ML ~~LOC~~ SOLN
5.0000 [IU] | Freq: Three times a day (TID) | SUBCUTANEOUS | Status: DC
  Administered 2019-01-05 – 2019-01-07 (×6): 5 [IU] via SUBCUTANEOUS

## 2019-01-05 MED ORDER — INSULIN GLARGINE 100 UNIT/ML ~~LOC~~ SOLN
18.0000 [IU] | Freq: Every day | SUBCUTANEOUS | Status: DC
  Administered 2019-01-06 – 2019-01-07 (×2): 18 [IU] via SUBCUTANEOUS
  Filled 2019-01-05 (×3): qty 0.18

## 2019-01-05 MED ORDER — WARFARIN SODIUM 2.5 MG PO TABS
2.5000 mg | ORAL_TABLET | Freq: Once | ORAL | Status: AC
  Administered 2019-01-05: 18:00:00 2.5 mg via ORAL
  Filled 2019-01-05: qty 1

## 2019-01-05 NOTE — Consult Note (Signed)
Consultation Note Date: 01/05/19  Patient Name: Rebecca [name]  DOB: 1940-02-10  MRN: [id number]  Age / Sex: 78 y.o., female  PCP: Lucia Gaskins, MD Referring Physician: Barton Dubois, MD  Reason for Consultation: Establishing goals of care  HPI/Patient Profile: 78 y.o. female  with past medical history of diastolic CHF, type 2 DM, renal disorder, neuropathy, narcolepsy, mitral stenosis, aortic stenosis, DVT, hiatal hernia, GERD, essential hypertension admitted on 01/01/2019 with shortness of breath, malaise, cough. 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 Receiving IV diuresis and prednisone. Palliative medicine consultation for goals of care.   Clinical Assessment and Goals of Care:  I have reviewed medical records, discussed with Dr. Dyann Kief, and met with patient at bedside to discuss goals of care. Brandye is awake, alert, oriented and able to participate in discussion. She is sitting up in recliner and asks for nursing staff to get her back in bed.   Introduced Palliative Medicine as specialized medical care for people living with serious illness. It focuses on providing relief from the symptoms and stress of a serious illness. The goal is to improve quality of life for both the patient and the family.  We discussed a brief life review of the patient. Widowed. One daughter, one son, and one granddaughter whom are all very supportive. [name]'s son lives with her. Discussed her recent admission with covid-19. She discharged home after this. Baseline, she minimally walks. Often scoots around in her wheelchair. Appetite has been excellent. Wears home oxygen, but mostly just HS.   Discussed course of hospitalization including diagnoses,  interventions, plan of care.   I attempted to elicit values and goals of care important to the patient. Patient is eager to return home following hospitalization. She does NOT want to discharge to SNF. Nikkie states that she spoke with her granddaughter, [name], who is willing to care for her. [name] lives right next door from [place].   Therapeutic listening as Jynesis shares history of her husband, who fell off a deer stand and was paralyzed for 20+ years before he died. He died 10 years ago. [name] shares that her and family were able to diligently care for him at home and he never once had a bed sore. This is her biggest reasoning for wanting to stay home and out of a nursing home. [name] does become tearful and speaks of not wanting to be a burden on her family.   Advanced directives, concepts specific to code status, artifical feeding and hydration were discussed. [name] reports she has a documented living will with both son and daughter as POA's. [name] further explains her desires against life-prolonging interventions if terminally ill, sharing that her husband died at [facility] and taking him off the ventilator was the hardest decision she has ever made. "I don't want to put my children through that."   Introduced and discussed MOST form. Encouraged  [name] to discuss with her children and consider completing with PMT provider this admission or any provider outpatient. Explained that [name] is a FULL code in the computer, and if she truly would not wish for heroic measures at EOL, she should discuss with children and consider DNR code status. She is also adamant she would never want a feeding tube. Hard Choices booklet left for review. [name] is appreciative of information and plans to discuss with children.   Therapeutic listening as [name] shares stories of her past and family. Answered questions and concerns. PMT contact information given. She is hopeful to return home as soon as possible.     SUMMARY OF  RECOMMENDATIONS    Full code/Full scope treatment  Patient reports she has documented AD/POA paperwork. Not on file. Daughter and son are POA's.  Introduced and discussed MOST form. Patient does speak of her desire against life-prolonging interventions if terminally ill. Encouraged she discuss MOST form with children and consider completing with PMT provider inpatient or a provider outpatient.   Patient does NOT want to discharge to SNF for rehab. She prefers to return home and states her granddaughter is willing to care for her. Will need home health services. May benefit from outpatient palliative referral.   Code Status/Advance Care Planning:  Full code  Symptom Management:   Per attending  Palliative Prophylaxis:   Aspiration, Bowel Regimen, Delirium Protocol and Oral Care  Additional Recommendations (Limitations, Scope, Preferences):  Full Scope Treatment  Psycho-social/Spiritual:   Desire for further Chaplaincy support: yes  Additional Recommendations: Caregiving  Support/Resources  Prognosis:   Unable to determine  Discharge Planning: To Be Determined      Primary Diagnoses: Present on Admission:  Acute on chronic respiratory failure with hypoxia ([place])   I have reviewed the medical record, interviewed the patient and family, and examined the patient. The following aspects are pertinent.  Past Medical History:  Diagnosis Date   Aortic stenosis    Cataplexy    Diastolic heart failure (HCC)    Essential hypertension    GERD (gastroesophageal reflux disease)    Hiatal hernia    History of recurrent deep vein thrombosis (DVT)    Mitral stenosis    Narcolepsy    Neuropathy    Renal disorder    Type 2 diabetes mellitus ([place])    Social History   Socioeconomic History   Marital status: Widowed    Spouse name: Not on file   Number of children: Not on file   Years of education: 11   Highest education level: Not on file  Occupational  History    Employer: RETIRED  Social Needs   Financial resource strain: Not on file   Food insecurity    Worry: Not on file    Inability: Not on file   Transportation needs    Medical: Not on file    Non-medical: Not on file  Tobacco Use   Smoking status: Former Smoker    Quit date: 10/13/1988    Years since quitting: 30.2   Smokeless tobacco: Never Used  Substance and Sexual Activity   Alcohol use: No    Alcohol/week: 0.0 standard drinks   Drug use: No   Sexual activity: Not on file  Lifestyle   Physical activity    Days per week: Not on file    Minutes per session: Not on file   Stress: Not on file  Relationships   Social connections    Talks on phone: Not on file  Gets together: Not on file    Attends religious service: Not on file    Active member of club or organization: Not on file    Attends meetings of clubs or organizations: Not on file    Relationship status: Not on file  Other Topics Concern   Not on file  Social History Narrative   Not on file   Family History  Problem Relation Age of Onset   Heart disease Father    Diabetes Other    Stroke Mother    Scleroderma Brother    Scheduled Meds:  amLODipine  10 mg Oral Daily   carvedilol  3.125 mg Oral BID WC   Chlorhexidine Gluconate Cloth  6 each Topical Daily   clomiPRAMINE  25 mg Oral QID   clonazePAM  1 mg Oral QHS   diclofenac Sodium  4 g Topical TID   docusate sodium  100 mg Oral BID   DULoxetine  60 mg Oral Daily   furosemide  40 mg Intravenous Q12H   gabapentin  300 mg Oral BID   insulin aspart  0-15 Units Subcutaneous TID WC   insulin aspart  0-5 Units Subcutaneous QHS   insulin aspart  5 Units Subcutaneous TID WC   [START ON 01/06/2019] insulin glargine  18 Units Subcutaneous Daily   mirtazapine  7.5 mg Oral QHS   multivitamin with minerals  1 tablet Oral Daily   nystatin   Topical TID   polyethylene glycol  17 g Oral Daily   potassium chloride  10 mEq  Oral Daily   predniSONE  40 mg Oral Q breakfast   QUEtiapine  100 mg Oral QHS   sodium chloride flush  3 mL Intravenous Q12H   vitamin C  500 mg Oral Daily   warfarin  2.5 mg Oral Once   Warfarin - Pharmacist Dosing Inpatient   Does not apply q1800   zinc sulfate  220 mg Oral Daily   Continuous Infusions:  sodium chloride Stopped (01/01/19 1701)   PRN Meds:.sodium chloride, acetaminophen, sodium chloride flush, traZODone Medications Prior to Admission:  Prior to Admission medications   Medication Sig Start Date End Date Taking? Authorizing Provider  acetaminophen (TYLENOL) 500 MG tablet Take 1,000 mg by mouth 2 (two) times daily as needed for mild pain.    Yes [provider]  carvedilol (COREG) 3.125 MG tablet TAKE 1 TABLET BY MOUTH TWICE DAILY WITH A MEAL Patient taking differently: Take 3.125 mg by mouth 2 (two) times daily with a meal.  09/09/18  Yes Herminio Commons, MD  Cholecalciferol (VITAMIN D3) 3000 UNITS TABS Take 1 capsule by mouth daily.   Yes [provider]  clomiPRAMINE (ANAFRANIL) 25 MG capsule Take 25 mg by mouth 4 (four) times daily.    Yes [provider]  clonazePAM (KLONOPIN) 1 MG tablet Take 1 tablet (1 mg total) by mouth at bedtime. 12/15/18  Yes Thurnell Lose, MD  DULoxetine (CYMBALTA) 60 MG capsule Take 60 mg by mouth daily.     Yes [provider]  ferrous sulfate 325 (65 FE) MG tablet Take 325 mg by mouth 2 (two) times daily with a meal.    Yes [provider]  furosemide (LASIX) 40 MG tablet Take 20 mg by mouth 2 (two) times daily.  12/01/14  Yes [provider]  gabapentin (NEURONTIN) 300 MG capsule Take 300 mg by mouth 2 (two) times daily.   Yes [provider]  insulin glargine (LANTUS) 100 unit/mL  SOPN Inject 24 Units into the skin daily.    Yes [provider]  KLOR-CON M10 10 MEQ tablet Take 10 mEq by mouth daily. 12/14/18  Yes [provider]  magnesium oxide  (MAG-OX) 400 MG tablet Take 400 mg by mouth daily.   Yes [provider]  metFORMIN (GLUCOPHAGE) 500 MG tablet Take 500 mg by mouth 2 (two) times daily with a meal.   Yes [provider]  mirtazapine (REMERON) 7.5 MG tablet Take 1 tablet (7.5 mg total) by mouth at bedtime. Patient taking differently: Take 15 mg by mouth at bedtime.  12/15/18  Yes Thurnell Lose, MD  Multiple Vitamin (MULTIVITAMIN WITH MINERALS) TABS tablet Take 1 tablet by mouth daily.   Yes [provider]  QUEtiapine (SEROQUEL) 100 MG tablet Take 100 mg by mouth at bedtime.   Yes [provider]  vitamin C (ASCORBIC ACID) 500 MG tablet Take 500 mg by mouth daily.   Yes [provider]  warfarin (COUMADIN) 5 MG tablet Take 2.5-5 mg by mouth as directed. Take 2.59m one day, then alternates by taking 569mthe next day   Yes [provider]  zinc sulfate 220 (50 Zn) MG capsule Take 220 mg by mouth daily.   Yes [provider]  BYDUREON 2 MG PEN Inject 2 mg into the skin once a week. 01/03/16   [provider]  clobetasol cream (TEMOVATE) 0.9.20 Apply 1 application topically daily.  11/11/18   [provider]   Allergies  Allergen Reactions   Codeine Anxiety   Compazine Anaphylaxis   Other Anaphylaxis, Rash and Other (See Comments)    Uncoded Allergy. Allergen: INOVAR Uncoded Allergy. Allergen: ALL ADHESIVES Uncoded Allergy. Allergen: SILK SUTURE Uncoded Allergy. Allergen: merthiolate Thermasol   Penicillins Shortness Of Breath and Rash    Has patient had a PCN reaction causing immediate rash, facial/tongue/throat swelling, SOB or lightheadedness with hypotension: Yes Has patient had a PCN reaction causing severe rash involving mucus membranes or skin necrosis: No Has patient had a PCN reaction that required hospitalization No Has patient had a PCN reaction occurring within the last 10 years: Yes If all of the above answers are "NO", then  may proceed with Cephalosporin use.    Morphine And Related Other (See Comments)    Patient states that it makes her lose her state of mind.    Demerol [Meperidine] Rash   Review of Systems  Constitutional: Positive for activity change.  Respiratory: Positive for shortness of breath.    Physical Exam Constitutional:      General: She is awake.  Pulmonary:     Effort: No tachypnea, accessory muscle usage or respiratory distress.     Comments: 4L  Skin:    General: Skin is warm and dry.  Neurological:     Mental Status: She is alert and oriented to person, place, and time.  Psychiatric:        Mood and Affect: Mood normal.        Speech: Speech normal.        Behavior: Behavior normal.        Cognition and Memory: Cognition normal.    Vital Signs: BP 119/89    Pulse (!) 58    Temp (!) 97.5 F (36.4 C) (Oral)    Resp 18    Ht '5\' 8"'  (1.727 m)    Wt 106.9 kg    SpO2 95%    BMI 35.83 kg/m  Pain Scale:  0-10   Pain Score: 0-No pain   SpO2: SpO2: 95 % O2 Device:SpO2: 95 % O2 Flow Rate: .O2 Flow Rate (L/min): 4 L/min  IO: Intake/output summary:   Intake/Output Summary (Last 24 hours) at 01/05/2019 1430 Last data filed at 01/05/2019 1002 Gross per 24 hour  Intake 483 ml  Output 750 ml  Net -267 ml    LBM: Last BM Date: 12/30/18 Baseline Weight: Weight: 105.9 kg Most recent weight: Weight: 106.9 kg     Palliative Assessment/Data: PPS 50%     Time In: 1400 Time Out: 1510 Time Total: 70 Greater than 50%  of this time was spent counseling and coordinating care related to the above assessment and plan.  Signed by:  Ihor Dow, DNP, FNP-C Palliative Medicine Team  Phone: [phone] Fax: [phone]   Please contact Palliative Medicine Team phone at [phone] for questions and concerns.  For individual provider: See Shea Evans

## 2019-01-05 NOTE — Progress Notes (Signed)
PROGRESS NOTE    Rebecca [name]  MEQ:[id number] DOB: October 19, 1940 DOA: 01/01/2019 PCP: [name], MD     Brief Narrative:  78 y.o. female with a past medical history significant for essential hypertension, chronic diastolic heart failure, aortic stenosis, cataplexy, morbid obesity, narcolepsy, history of recurrent DVTs, gastroesophageal reflux disease, type 2 diabetes mellitus with nephropathy, chronic kidney disease a stage IIIa and a recent admission secondary to Covid pneumonia; who presented to the hospital secondary to worsening shortness of breath and nonproductive cough.  Patient also expressed general malaise and increased lower extremity swelling.  She reports that for the last 2 days the breathing compromises to the point that her oxygen supplementation was not enough.  Patient reported sudden chest tightness sensation on the night prior to admission and expressed mild orthopnea.   Patient denies any nausea, vomiting, diarrhea, abdominal pain, fever, chills, chest pain, hematemesis, hematochezia, dysuria and hematuria.  Work-up in the ED demonstrated elevated BNP (even lower than prior levels), oxygen supplementation at the time the EMS picked her up to be in the mid 70s and transiently requiring nonrebreather mask.  Chest x-ray with multifocal infiltrates, vascular congestion and interstitial edema.  Blood work demonstrated normal WBCs, patient is afebrile, elevated fibrinogen of 564, CBG of 226, LDH 199,CRP 6.5, D-dimer 1.68, procalcitonin level < 0.10, repeat Covid test by PCR positive; negative high sensitive troponin X 2.  Blood cultures were taken, IV antibiotics broad-spectrum initiated and [facility] contacted to admit patient for further evaluation and management.   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 -Continue IV diuresis -Low-sodium diet, daily weights and strict I's and O's -Will start steroids with intention for slow tapering -Continue to wean down oxygen supplementation to baseline; she uses 2-3 L nasal cannula mainly at nighttime; but as per son reports since most recent Covid infection and admission, has been requiring the oxygen supplementation intermittently throughout the day as well. -Increase physical activity, wean down oxygen supplementation as tolerated and follow clinical response.  2-Acute on chronic diastolic heart failure -As mentioned above continue monitoring on telemetry -Continue IV diuresis -Follow daily weights, I's and O's and low-sodium diet.  3-Paroxysmal atrial fibrillation -Continue beta-blocker for rate control -Continue warfarin for secondary prevention; pharmacy dosing -INR within normal limits.  4-Essential hypertension -Overall stable -Continue current antihypertensive regimen -Follow vital signs -Continue heart healthy diet.  5-reflux disease -Continue PPI.  6-depression and/anxiety -Continue the use of Cymbalta, Seroquel and as needed Klonopin. -Overall mood is a stable.  7-physical deconditioning -Physical therapy has evaluated patient and is recommending skilled nursing facility at discharge for further rehabilitation. -Social worker will be made aware.  8-class II morbid obesity -Body mass index is 35.83 kg/m. -Low calorie diet, portion control and increase physical activity discussed with patient.  9-type 2 diabetes with nephropathy -A1c 8.2 -Continue sliding scale insulin and Lantus -Follow CBGs and adjust hypoglycemic regimen as needed -Initiation of his steroids might be responsible for fluctuation of her CBGs. -start novolog TID for meal coverage.   10-chronic kidney disease a stage IIIa -Stable and at baseline -Continue close monitoring of renal function while diuresing patient.  11-bilateral  knee pain -secondary to OA -will continue diclofenac gel -no swelling, no erythema on her joints appreciated.  DVT prophylaxis: Chronically on warfarin. Code Status: Full code Family Communication: No family at bedside.  Disposition Plan: Remains inpatient, continue IV diuresis and prednisone.  Patient will require skilled nursing facility at discharge for rehabilitation and further care.  Consultants:   None  Procedures:   See below for x-ray reports.  Antimicrobials:  Anti-infectives (From admission, onward)   Start     Dose/Rate Route Frequency Ordered Stop   01/02/19 0800  vancomycin (VANCOCIN) IVPB 1000 mg/200 mL premix  Status:  Discontinued     1,000 mg 200 mL/hr over 60 Minutes Intravenous Every 24 hours 01/01/19 0654 01/01/19 1332   01/01/19 0700  vancomycin (VANCOCIN) IVPB 1000 mg/200 mL premix     1,000 mg 200 mL/hr over 60 Minutes Intravenous Every 1 hr x 2 01/01/19 0654 01/01/19 1314   01/01/19 0645  aztreonam (AZACTAM) 2 g in sodium chloride 0.9 % 100 mL IVPB     2 g 200 mL/hr over 30 Minutes Intravenous  Once 01/01/19 0641 01/01/19 0915       Subjective: No fever, no chest pain, no nausea, no vomiting.  Still short of breath with minimal exertion, requiring 3-4 L nasal cannula supplementation around-the-clock and expressing begin significantly weak and deconditioned.  Patient also expressed constipation.  Blood sugar has been elevated in the mid 200's range.  Objective: Vitals:   01/04/19 2202 01/05/19 0300 01/05/19 0510 01/05/19 0943  BP: 138/68  133/79 119/89  Pulse: 62  (!) 59 (!) 58  Resp: (!) 24  18   Temp: 98.4 F (36.9 C)  (!) 97.5 F (36.4 C)   TempSrc: Oral  Oral   SpO2: 92%  95%   Weight:  106.9 kg    Height:        Intake/Output Summary (Last 24 hours) at 01/05/2019 1319 Last data filed at 01/05/2019 1002 Gross per 24 hour  Intake 483 ml  Output 750 ml  Net -267 ml   Filed Weights   01/03/19 0636 01/04/19 0500 01/05/19 0300  Weight:  107.3 kg 107.2 kg 106.9 kg    Examination: General exam: Alert, awake, oriented x 3; still requiring around 3-4 L nasal supplementation around-the-clock.  Expressing feeling short of breath with activity, deconditioned and weak.  Patient reports some constipation.  Still with signs of fluid overload on exam.  Slowly improving Respiratory system: Fine crackles at the bases, no using accessory muscles, normal respiratory effort.  Fair air movement bilaterally.   Cardiovascular system:RRR. No murmurs, rubs, gallops. Gastrointestinal system: Abdomen is obese, nondistended, soft and nontender. No organomegaly or masses felt. Normal bowel sounds heard. Central nervous system: Alert and oriented. No focal neurological deficits. Extremities: No cyanosis or clubbing; trace-1+ edema bilaterally, no joint swelling, no deformity. Skin: No petechiae. Psychiatry: Judgement and insight appear normal. Mood & affect appropriate.   Data Reviewed: I have personally reviewed following labs and imaging studies  CBC: Recent Labs  Lab 01/01/19 0555  WBC 8.8  NEUTROABS 6.9  HGB 11.5*  HCT 37.2  MCV 97.1  PLT 448   Basic Metabolic Panel: Recent Labs  Lab 01/01/19 0555 01/03/19 0650 01/04/19 0522 01/05/19 0455  NA 139 135 137 139  K 4.3 3.9 3.5 3.9  CL 99 95* 95* 94*  CO2 29 29 32 33*  GLUCOSE 219* 184* 173* 177*  BUN 20 21 26* 34*  CREATININE 1.09* 0.92 0.94 1.06*  CALCIUM 8.8* 8.6* 8.7* 8.6*   GFR: Estimated Creatinine Clearance: 55.1 mL/min (A) (by C-G formula based on SCr of 1.06 mg/dL (H)).   Liver Function Tests: Recent Labs  Lab 01/01/19  0555  AST 24  ALT 26  ALKPHOS 58  BILITOT 0.7  PROT 7.5  ALBUMIN 3.1*   Coagulation Profile: Recent Labs  Lab 01/01/19 0555 01/02/19 0406 01/03/19 0650 01/04/19 0619 01/05/19 0455  INR 2.6* 2.6* 2.2* 2.4* 2.4*   CBG: Recent Labs  Lab 01/04/19 1118 01/04/19 1620 01/04/19 2204 01/05/19 0941 01/05/19 1140  GLUCAP 218* 285* 377*  235* 259*   Urine analysis:    Component Value Date/Time   COLORURINE AMBER (A) 12/08/2018 2046   APPEARANCEUR CLOUDY (A) 12/08/2018 2046   LABSPEC 1.019 12/08/2018 2046   PHURINE 5.0 12/08/2018 2046   GLUCOSEU NEGATIVE 12/08/2018 2046   HGBUR NEGATIVE 12/08/2018 2046   BILIRUBINUR NEGATIVE 12/08/2018 2046   KETONESUR 5 (A) 12/08/2018 2046   PROTEINUR 30 (A) 12/08/2018 2046   UROBILINOGEN 0.2 09/05/2014 2015   NITRITE POSITIVE (A) 12/08/2018 2046   LEUKOCYTESUR TRACE (A) 12/08/2018 2046    Recent Results (from the past 240 hour(s))  Blood culture (routine x 2)     Status: None (Preliminary result)   Collection Time: 01/01/19  5:55 AM   Specimen: BLOOD  Result Value Ref Range Status   Specimen Description BLOOD RIGHT ARM  Final   Special Requests   Final    BOTTLES DRAWN AEROBIC AND ANAEROBIC Blood Culture adequate volume   Culture   Final    NO GROWTH 4 DAYS Performed at [facility], [address], [place], [place] [address]    Report Status PENDING  Incomplete  SARS Coronavirus 2 by RT PCR (hospital order, performed in [facility] hospital lab) Nasopharyngeal Nasopharyngeal Swab     Status: Abnormal   Collection Time: 01/01/19  7:37 AM   Specimen: Nasopharyngeal Swab  Result Value Ref Range Status   SARS Coronavirus 2 POSITIVE (A) NEGATIVE Final    Comment: RESULT CALLED TO, READ BACK BY AND VERIFIED WITH: WHITE M. @ 0905 ON [id number] BY HENDERSON L. (NOTE) SARS-CoV-2 target nucleic acids are DETECTED SARS-CoV-2 RNA is generally detectable in upper respiratory specimens  during the acute phase of infection.  Positive results are indicative  of the presence of the identified virus, but do not rule out bacterial infection or co-infection with other pathogens not detected by the test.  Clinical correlation with patient history and  other diagnostic information is necessary to determine patient infection status.  The expected result is negative. Fact Sheet for Patients:    BoilerBrush.com.cy  Fact Sheet for Healthcare Providers:   https://pope.com/   This test is not yet approved or cleared by the [place] FDA and  has been authorized for detection and/or diagnosis of SARS-CoV-2 by FDA under an Emergency Use Authorization (EUA).  This EUA will remain in effect (meaning this t est can be used) for the duration of  the COVID-19 declaration under Section 564(b)(1) of the Act, 21 U.S.C. section 360-bbb-3(b)(1), unless the authorization is terminated or revoked sooner. Performed at [facility], [address], [place], [place] [address]   Blood culture (routine x 2)     Status: None (Preliminary result)   Collection Time: 01/01/19  7:42 AM   Specimen: BLOOD RIGHT FOREARM  Result Value Ref Range Status   Specimen Description   Final    BLOOD RIGHT FOREARM BOTTLES DRAWN AEROBIC AND ANAEROBIC   Special Requests   Final    Blood Culture results may not be optimal due to an inadequate volume of blood received in culture bottles   Culture   Final  NO GROWTH 4 DAYS Performed at [facility], [address] [place], [place] [address]    Report Status PENDING  Incomplete  MRSA PCR Screening     Status: None   Collection Time: 01/01/19  5:20 PM   Specimen: Nasal Mucosa; Nasopharyngeal  Result Value Ref Range Status   MRSA by PCR NEGATIVE NEGATIVE Final    Comment:        The GeneXpert MRSA Assay (FDA approved for NASAL specimens only), is one component of a comprehensive MRSA colonization surveillance program. It is not intended to diagnose MRSA infection nor to guide or monitor treatment for MRSA infections. Performed at [facility], [address] [place], [place] [address]      Radiology Studies: No results found. Scheduled Meds: . amLODipine  10 mg Oral Daily  . carvedilol  3.125 mg Oral BID WC  . Chlorhexidine Gluconate Cloth  6 each Topical Daily  . clomiPRAMINE  25 mg Oral QID  .  clonazePAM  1 mg Oral QHS  . diclofenac Sodium  4 g Topical TID  . docusate sodium  100 mg Oral BID  . DULoxetine  60 mg Oral Daily  . furosemide  40 mg Intravenous Q12H  . gabapentin  300 mg Oral BID  . insulin aspart  0-15 Units Subcutaneous TID WC  . insulin aspart  0-5 Units Subcutaneous QHS  . insulin aspart  5 Units Subcutaneous TID WC  . [START ON 01/06/2019] insulin glargine  18 Units Subcutaneous Daily  . mirtazapine  7.5 mg Oral QHS  . multivitamin with minerals  1 tablet Oral Daily  . nystatin   Topical TID  . polyethylene glycol  17 g Oral Daily  . potassium chloride  10 mEq Oral Daily  . predniSONE  40 mg Oral Q breakfast  . QUEtiapine  100 mg Oral QHS  . sodium chloride flush  3 mL Intravenous Q12H  . vitamin C  500 mg Oral Daily  . warfarin  2.5 mg Oral Once  . Warfarin - Pharmacist Dosing Inpatient   Does not apply q1800  . zinc sulfate  220 mg Oral Daily   Continuous Infusions: . sodium chloride Stopped (01/01/19 1701)     LOS: 4 days    Time spent: 30 minutes.    Vassie Lollarlos Leilany Digeronimo, MD [facility] Pager [phone]   01/05/2019, 1:19 PM

## 2019-01-05 NOTE — Progress Notes (Signed)
ANTICOAGULATION CONSULT NOTE - follow up Consult  Pharmacy Consult for warfarin Indication: hx VTE  Allergies  Allergen Reactions  . Codeine Anxiety  . Compazine Anaphylaxis  . Other Anaphylaxis, Rash and Other (See Comments)    Uncoded Allergy. Allergen: INOVAR Uncoded Allergy. Allergen: ALL ADHESIVES Uncoded Allergy. Allergen: SILK SUTURE Uncoded Allergy. Allergen: merthiolate Thermasol  . Penicillins Shortness Of Breath and Rash    Has patient had a PCN reaction causing immediate rash, facial/tongue/throat swelling, SOB or lightheadedness with hypotension: Yes Has patient had a PCN reaction causing severe rash involving mucus membranes or skin necrosis: No Has patient had a PCN reaction that required hospitalization No Has patient had a PCN reaction occurring within the last 10 years: Yes If all of the above answers are "NO", then may proceed with Cephalosporin use.   Marland Kitchen Morphine And Related Other (See Comments)    Patient states that it makes her lose her state of mind.   . Demerol [Meperidine] Rash    Patient Measurements: Height: 5\' 8"  (172.7 cm) Weight: 235 lb 10.8 oz (106.9 kg) IBW/kg (Calculated) : 63.9 Vital Signs: Temp: 97.5 F (36.4 C) (12/01 0510) Temp Source: Oral (12/01 0510) BP: 133/79 (12/01 0510) Pulse Rate: 59 (12/01 0510)  Labs: Recent Labs    01/03/19 0650 01/04/19 0522 01/04/19 0619 01/05/19 0455  LABPROT 24.3*  --  25.9* 26.2*  INR 2.2*  --  2.4* 2.4*  CREATININE 0.92 0.94  --  1.06*    Estimated Creatinine Clearance: 55.1 mL/min (A) (by C-G formula based on SCr of 1.06 mg/dL (H)).   Medical History: Past Medical History:  Diagnosis Date  . Aortic stenosis   . Cataplexy   . Diastolic heart failure (HCC)   . Essential hypertension   . GERD (gastroesophageal reflux disease)   . Hiatal hernia   . History of recurrent deep vein thrombosis (DVT)   . Mitral stenosis   . Narcolepsy   . Neuropathy   . Renal disorder   . Type 2 diabetes  mellitus (HCC)     Medications:  Medications Prior to Admission  Medication Sig Dispense Refill Last Dose  . acetaminophen (TYLENOL) 500 MG tablet Take 1,000 mg by mouth 2 (two) times daily as needed for mild pain.    12/31/2018 at Unknown time  . carvedilol (COREG) 3.125 MG tablet TAKE 1 TABLET BY MOUTH TWICE DAILY WITH A MEAL (Patient taking differently: Take 3.125 mg by mouth 2 (two) times daily with a meal. ) 60 tablet 6 12/31/2018 at 1700  . Cholecalciferol (VITAMIN D3) 3000 UNITS TABS Take 1 capsule by mouth daily.   12/31/2018 at Unknown time  . clomiPRAMINE (ANAFRANIL) 25 MG capsule Take 25 mg by mouth 4 (four) times daily.    12/31/2018 at 2000  . clonazePAM (KLONOPIN) 1 MG tablet Take 1 tablet (1 mg total) by mouth at bedtime.   12/31/2018 at 2000  . DULoxetine (CYMBALTA) 60 MG capsule Take 60 mg by mouth daily.     12/31/2018  . ferrous sulfate 325 (65 FE) MG tablet Take 325 mg by mouth 2 (two) times daily with a meal.    12/31/2018 at 2000  . furosemide (LASIX) 40 MG tablet Take 20 mg by mouth 2 (two) times daily.   5 12/31/2018 at 1700  . gabapentin (NEURONTIN) 300 MG capsule Take 300 mg by mouth 2 (two) times daily.   12/31/2018 at 2000  . insulin glargine (LANTUS) 100 unit/mL SOPN Inject 24 Units into the skin  daily.    12/31/2018 at 2000  . KLOR-CON M10 10 MEQ tablet Take 10 mEq by mouth daily.   12/31/2018 at Unknown time  . magnesium oxide (MAG-OX) 400 MG tablet Take 400 mg by mouth daily.   12/31/2018 at Unknown time  . metFORMIN (GLUCOPHAGE) 500 MG tablet Take 500 mg by mouth 2 (two) times daily with a meal.   12/31/2018 at 1700  . mirtazapine (REMERON) 7.5 MG tablet Take 1 tablet (7.5 mg total) by mouth at bedtime. (Patient taking differently: Take 15 mg by mouth at bedtime. ) 30 tablet 0 12/31/2018 at 2000  . Multiple Vitamin (MULTIVITAMIN WITH MINERALS) TABS tablet Take 1 tablet by mouth daily.   12/31/2018 at Unknown time  . QUEtiapine (SEROQUEL) 100 MG tablet Take 100 mg  by mouth at bedtime.   12/31/2018 at 2000  . vitamin C (ASCORBIC ACID) 500 MG tablet Take 500 mg by mouth daily.   12/31/2018 at Unknown time  . warfarin (COUMADIN) 5 MG tablet Take 2.5-5 mg by mouth as directed. Take 2.5mg  one day, then alternates by taking 5mg  the next day   12/31/2018 at 2000  . zinc sulfate 220 (50 Zn) MG capsule Take 220 mg by mouth daily.   12/31/2018 at Unknown time  . BYDUREON 2 MG PEN Inject 2 mg into the skin once a week.  5 Not Taking at Unknown time  . clobetasol cream (TEMOVATE) 7.00 % Apply 1 application topically daily.        Assessment: Pharmacy consulted to dose warfarin in patient with history of DVT.  Patient's listed home dose of warfarin is alternating 2.5 mg and 5 mg every other day (5 mg on 11/26)  INR today therapeutic at 2.4  Goal of Therapy:  INR 2-3 Monitor platelets by anticoagulation protocol: Yes   Plan:  Warfarin 2.5 mg x 1 dose. Monitor daily INR and s/s of bleeding.  Isac Sarna, BS [place], [place] Clinical Pharmacist Pager [phone] 01/05/2019 9:34 AM

## 2019-01-05 NOTE — Progress Notes (Signed)
Inpatient Diabetes Program Recommendations  AACE/ADA: New Consensus Statement on Inpatient Glycemic Control   Target Ranges:  Prepandial:   less than 140 mg/dL      Peak postprandial:   less than 180 mg/dL (1-2 hours)      Critically ill patients:  140 - 180 mg/dL   Results for Rebecca [name], Rebecca [name] (MRN [id number]) as of 01/05/2019 11:17  Ref. Range 01/04/2019 07:42 01/04/2019 11:18 01/04/2019 16:20 01/04/2019 22:04 01/05/2019 09:41  Glucose-Capillary Latest Ref Range: 70 - 99 mg/dL 157 (H) 218 (H) 285 (H) 377 (H) 235 (H)   Review of Glycemic Control  Diabetes history: DM2 Outpatient Diabetes medications: Lantus 24 units daily, Metformin 500 mg BID, Bydureon 2mg  Qweek Current orders for Inpatient glycemic control: Lantus 15 units QHS, Novolog 0-15 units TID, Novolog 0-5 units QHS; Prednisone 40 mg QAM  Inpatient Diabetes Program Recommendations:    Insulin-Meal Coverage: If steroids are continued, please consider ordering Novolog 5 units TID with meals for meal coverage if patient eats at least 50% of meals.  Thanks, Barnie Alderman, RN, MSN, CDE Diabetes Coordinator Inpatient Diabetes Program [phone] (Team Pager from 8am to 5pm)

## 2019-01-05 NOTE — Plan of Care (Signed)
No skin breakdown noted, pt up to chair with PT and/or one person assist. Pt had BM early this am, no further complaints of constipation. SOB with exertion but recovers quickly. Tolerated PO without issue. Continues to have periods of confusion, easily reoriented.  Problem: Education: Goal: Knowledge of General Education information will improve Description: Including pain rating scale, medication(s)/side effects and non-pharmacologic comfort measures Outcome: Progressing   Problem: Health Behavior/Discharge Planning: Goal: Ability to manage health-related needs will improve Outcome: Progressing   Problem: Clinical Measurements: Goal: Ability to maintain clinical measurements within normal limits will improve Outcome: Progressing Goal: Will remain free from infection Outcome: Progressing Goal: Diagnostic test results will improve Outcome: Progressing Goal: Respiratory complications will improve Outcome: Progressing Goal: Cardiovascular complication will be avoided Outcome: Progressing   Problem: Activity: Goal: Risk for activity intolerance will decrease Outcome: Progressing   Problem: Nutrition: Goal: Adequate nutrition will be maintained Outcome: Progressing   Problem: Coping: Goal: Level of anxiety will decrease Outcome: Progressing   Problem: Elimination: Goal: Will not experience complications related to bowel motility Outcome: Progressing Goal: Will not experience complications related to urinary retention Outcome: Progressing   Problem: Pain Managment: Goal: General experience of comfort will improve Outcome: Progressing   Problem: Safety: Goal: Ability to remain free from injury will improve Outcome: Progressing   Problem: Skin Integrity: Goal: Risk for impaired skin integrity will decrease Outcome: Progressing

## 2019-01-05 NOTE — Plan of Care (Signed)

## 2019-01-05 NOTE — Progress Notes (Addendum)
Physical Therapy Treatment Patient Details Name: Rebecca [name] MRN: [id number] DOB: 06/02/1940 Today's Date: 01/05/2019    History of Present Illness Rebecca [name] is a 78 y.o. female with a past medical history significant for essential hypertension, chronic diastolic heart failure, aortic stenosis, cataplexy, morbid obesity, narcolepsy, history of recurrent DVTs, gastroesophageal reflux disease, type 2 diabetes mellitus with nephropathy, chronic kidney disease a stage IIIa and a recent admission secondary to Covid pneumonia; who presented to the hospital secondary to worsening shortness of breath and nonproductive cough.  Patient also expressed general malaise and increased lower extremity swelling.  She reports that for the last 2 days the breathing compromises to the point that her oxygen supplementation was not enough.  Patient reported sudden chest tightness sensation on the night prior to admission and expressed mild orthopnea.  Patient denies any nausea, vomiting, diarrhea, abdominal pain, fever, chills, chest pain, hematemesis, hematochezia, dysuria and hematuria.    PT Comments    Patient requires mod assist with transfers and bed mobility and mod/max assist for for standing/gait and transitioning to chair. She requires elevated bed to be able to transition to standing with assist and RW. She requires frequent verbal cueing throughout session for sequencing. She is able to tolerate exercises at bedside and is encouraged to complete exercises throughout the day while seated. Patient is limited by fatigue and weakness. Patient left in chair - RN notified. Patient will benefit from continued physical therapy in hospital and recommended venue below to increase strength, balance, endurance for safe ADLs and gait.   Follow Up Recommendations  SNF     Equipment Recommendations  None recommended by PT    Recommendations for Other Services       Precautions / Restrictions  Precautions Precautions: Fall Restrictions Weight Bearing Restrictions: No    Mobility  Bed Mobility Overal bed mobility: Needs Assistance Bed Mobility: Supine to Sit Rolling: Mod assist Sidelying to sit: Mod assist Supine to sit: Mod assist     General bed mobility comments: slow labored movement with frequent cueing for sequencing  Transfers Overall transfer level: Needs assistance Equipment used: Rolling walker (2 wheeled) Transfers: Sit to/from [facility] Sit to Stand: Mod assist Stand pivot transfers: Mod assist       General transfer comment: very unsteady on feet, increased time, required bed elevated, frequent cueing for use of UE and positioning on walker  Ambulation/Gait Ambulation/Gait assistance: Mod assist;Max assist Gait Distance (Feet): 4 Feet Assistive device: Rolling walker (2 wheeled) Gait Pattern/deviations: Decreased step length - right;Decreased step length - left;Decreased stride length Gait velocity: decreased   General Gait Details: limited to 4-5 slow unsteady labored steps at bedside due to c/o fatigue and generalized weakness; frequent verbal cueing for weight shift and LE movement   Stairs             Wheelchair Mobility    Modified Rankin (Stroke Patients Only)       Balance Overall balance assessment: Needs assistance Sitting-balance support: Feet supported;No upper extremity supported Sitting balance-Leahy Scale: Fair Sitting balance - Comments: seated at bedside   Standing balance support: Bilateral upper extremity supported;During functional activity Standing balance-Leahy Scale: Poor Standing balance comment: using RW                            Cognition Arousal/Alertness: Awake/alert Behavior During Therapy: WFL for tasks assessed/performed Overall Cognitive Status: Within Functional Limits for tasks assessed  Exercises General  Exercises - Lower Extremity Ankle Circles/Pumps: AROM;Both;20 reps;Seated Long Arc Quad: AROM;Both;20 reps;Seated Hip Flexion/Marching: AROM;Both;20 reps;Seated    General Comments        Pertinent Vitals/Pain Pain Assessment: No/denies pain Faces Pain Scale: Hurts whole lot Pain Location: right knee Pain Descriptors / Indicators: Sore;Sharp Pain Intervention(s): Limited activity within patient's tolerance;Monitored during session    Home Living                      Prior Function            PT Goals (current goals can now be found in the care plan section) Acute Rehab PT Goals Patient Stated Goal: return home after rehab PT Goal Formulation: With patient Time For Goal Achievement: 02/12/19 Potential to Achieve Goals: Good Progress towards PT goals: Progressing toward goals    Frequency    Min 3X/week      PT Plan Current plan remains appropriate    Co-evaluation              AM-PAC PT "6 Clicks" Mobility   Outcome Measure  Help needed turning from your back to your side while in a flat bed without using bedrails?: A Lot Help needed moving from lying on your back to sitting on the side of a flat bed without using bedrails?: A Lot Help needed moving to and from a bed to a chair (including a wheelchair)?: A Lot Help needed standing up from a chair using your arms (e.g., wheelchair or bedside chair)?: A Lot Help needed to walk in hospital room?: A Lot Help needed climbing 3-5 steps with a railing? : Total 6 Click Score: 11    End of Session Equipment Utilized During Treatment: Oxygen Activity Tolerance: Patient tolerated treatment well;Patient limited by fatigue Patient left: in chair;with call bell/phone within reach Nurse Communication: Mobility status PT Visit Diagnosis: Muscle weakness (generalized) (M62.81);Unsteadiness on feet (R26.81);Other abnormalities of gait and mobility (R26.89)     Time: 3546-5681 PT Time Calculation (min) (ACUTE  ONLY): 27 min  Charges:  $Therapeutic Exercise: 8-22 mins $Therapeutic Activity: 8-22 mins                     12:27 PM, 01/05/19 Wyman Songster PT, DPT Physical Therapist at [facility]

## 2019-01-06 LAB — PROTIME-INR
INR: 2.5 — ABNORMAL HIGH (ref 0.8–1.2)
Prothrombin Time: 26.7 seconds — ABNORMAL HIGH (ref 11.4–15.2)

## 2019-01-06 LAB — BASIC METABOLIC PANEL
Anion gap: 12 (ref 5–15)
BUN: 34 mg/dL — ABNORMAL HIGH (ref 8–23)
CO2: 31 mmol/L (ref 22–32)
Calcium: 8.8 mg/dL — ABNORMAL LOW (ref 8.9–10.3)
Chloride: 95 mmol/L — ABNORMAL LOW (ref 98–111)
Creatinine, Ser: 0.96 mg/dL (ref 0.44–1.00)
GFR calc Af Amer: >60 mL/min (ref >60)
GFR calc non Af Amer: 56 mL/min — ABNORMAL LOW (ref >60)
Glucose, Bld: 159 mg/dL — ABNORMAL HIGH (ref 70–99)
Potassium: 4 mmol/L (ref 3.5–5.1)
Sodium: 138 mmol/L (ref 135–145)

## 2019-01-06 LAB — GLUCOSE, CAPILLARY
Glucose-Capillary: 125 mg/dL — ABNORMAL HIGH (ref 70–99)
Glucose-Capillary: 126 mg/dL — ABNORMAL HIGH (ref 70–99)
Glucose-Capillary: 207 mg/dL — ABNORMAL HIGH (ref 70–99)
Glucose-Capillary: 270 mg/dL — ABNORMAL HIGH (ref 70–99)
Glucose-Capillary: 324 mg/dL — ABNORMAL HIGH (ref 70–99)

## 2019-01-06 LAB — CULTURE, BLOOD (ROUTINE X 2)
Culture: NO GROWTH
Culture: NO GROWTH
Special Requests: ADEQUATE

## 2019-01-06 MED ORDER — PANTOPRAZOLE SODIUM 40 MG PO TBEC
40.0000 mg | DELAYED_RELEASE_TABLET | Freq: Every day | ORAL | Status: DC
  Administered 2019-01-07: 09:00:00 40 mg via ORAL
  Filled 2019-01-06 (×2): qty 1

## 2019-01-06 MED ORDER — WARFARIN SODIUM 5 MG PO TABS
5.0000 mg | ORAL_TABLET | Freq: Once | ORAL | Status: AC
  Administered 2019-01-06: 5 mg via ORAL
  Filled 2019-01-06: qty 1

## 2019-01-06 NOTE — Progress Notes (Signed)
PROGRESS NOTE    Rebecca [name]  YTK:[id number] DOB: [birth year] DOA: 01/01/2019 PCP: [name], MD     Brief Narrative:  78 y.o. female with a past medical history significant for essential hypertension, chronic diastolic heart failure, aortic stenosis, cataplexy, morbid obesity, narcolepsy, history of recurrent DVTs, gastroesophageal reflux disease, type 2 diabetes mellitus with nephropathy, chronic kidney disease a stage IIIa and a recent admission secondary to Covid pneumonia; who presented to the hospital secondary to worsening shortness of breath and nonproductive cough.  Patient also expressed general malaise and increased lower extremity swelling.  She reports that for the last 2 days the breathing compromises to the point that her oxygen supplementation was not enough.  Patient reported sudden chest tightness sensation on the night prior to admission and expressed mild orthopnea.   Patient denies any nausea, vomiting, diarrhea, abdominal pain, fever, chills, chest pain, hematemesis, hematochezia, dysuria and hematuria.  Work-up in the ED demonstrated elevated BNP (even lower than prior levels), oxygen supplementation at the time the EMS picked her up to be in the mid 70s and transiently requiring nonrebreather mask.  Chest x-ray with multifocal infiltrates, vascular congestion and interstitial edema.  Blood work demonstrated normal WBCs, patient is afebrile, elevated fibrinogen of 564, CBG of 226, LDH 199,CRP 6.5, D-dimer 1.68, procalcitonin level < 0.10, repeat Covid test by PCR positive; negative high sensitive troponin X 2.  Blood cultures were taken, IV antibiotics broad-spectrum initiated and [facility] contacted to admit patient for further evaluation and management.  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 -Continue IV diuresis -Low-sodium diet, daily weights and strict I's and O's -Continue prednisone 40 mg daily with very slow taper -PTA patient was on 2 L of oxygen via nasal cannula at home (mostly at night and as needed during the day), however NOW requiring 4 L continuously -Increase physical activity, wean down oxygen supplementation as tolerated and follow clinical response.  2-Acute on chronic diastolic heart failure -As mentioned above continue monitoring on telemetry -Continue IV diuresis Lasix 40 mg twice daily -Follow daily weights, I's and O's and low-sodium diet.  3-Paroxysmal atrial fibrillation -Continue Coreg 3.125 mg twice daily for rate control -Continue warfarin for stroke prevention; pharmacy dosing -INR within normal limits.  4-Essential hypertension -Overall stable -Continue amlodipine 10 mg daily, Coreg 12.5 mg twice daily, and Lasix as above -Follow vital signs -Continue heart healthy diet.  5-GERD-stable, reflux disease -Continue PPI.  6-depression and/anxiety -Continue the use of Cymbalta, Seroquel and as needed Klonopin. -Overall mood is a stable.  7-generalized weakness and physical deconditioning -Physical therapy has evaluated patient and is recommending skilled nursing facility at discharge for further rehabilitation. -Patient expresses desire to go home however may be too weak to be home alone as her son who she lives with works during the day  8-obesity -Body mass index is 35.9 kg/m. -Low calorie diet, portion control and increase physical activity discussed with patient.   9-type 2 diabetes with nephropathy -A1c 8.2 reflecting uncontrolled diabetes, -Continue sliding scale insulin and Lantus -Follow CBGs and adjust hypoglycemic regimen as needed -Initiation of his steroids might be responsible for fluctuation of her CBGs. -c/n  novolog TID for meal coverage.    10-bilateral knee pain -secondary to OA -no swelling, no erythema or other significant  inflammatory changes appreciated. -Tylenol as needed patient has used diclofenac gel   DVT prophylaxis: Chronically on warfarin. Code Status: Full code Family Communication: No family at bedside. Disposition Plan:  SNF rehab after further diuresis -Patient expresses desire to go home however may be too weak to be home alone as her son who she lives with works during the day -Possible discharge on 01/07/2019 if continues to improve (SNF vs Home with [facility])  Consultants:   None  Procedures:   See below for x-ray reports.  Antimicrobials:  Anti-infectives (From admission, onward)   Start     Dose/Rate Route Frequency Ordered Stop   01/02/19 0800  vancomycin (VANCOCIN) IVPB 1000 mg/200 mL premix  Status:  Discontinued     1,000 mg 200 mL/hr over 60 Minutes Intravenous Every 24 hours 01/01/19 0654 01/01/19 1332   01/01/19 0700  vancomycin (VANCOCIN) IVPB 1000 mg/200 mL premix     1,000 mg 200 mL/hr over 60 Minutes Intravenous Every 1 hr x 2 01/01/19 0654 01/01/19 1314   01/01/19 0645  aztreonam (AZACTAM) 2 g in sodium chloride 0.9 % 100 mL IVPB     2 g 200 mL/hr over 30 Minutes Intravenous  Once 01/01/19 0641 01/01/19 0915     Subjective:  -Significant dyspnea on exertion and hypoxia with minimum activity -No productive cough,  -No fevers or chills  Objective: Vitals:   01/06/19 0556 01/06/19 1431 01/06/19 1533 01/06/19 1630  BP: (!) 137/59 133/73 121/62 128/78  Pulse: (!) 59 (!) 118 61 62  Resp: 16 18 18 18   Temp:   97.6 F (36.4 C) 98.1 F (36.7 C)  TempSrc:   Oral Oral  SpO2: (!) 87% (!) 84% 98% 100%  Weight:      Height:        Intake/Output Summary (Last 24 hours) at 01/06/2019 1824 Last data filed at 01/06/2019 1735 Gross per 24 hour  Intake 966 ml  Output 1000 ml  Net -34 ml   Filed Weights   01/04/19 0500 01/05/19 0300 01/06/19 0500  Weight: 107.2 kg 106.9 kg 107.1 kg    Examination: General exam: Alert, awake, no conversational dyspnea at rest, becomes very dyspneic with minimal activity Nose- [place] 4 L/min  respiratory system: Diminished in bases with basilar rales , few scattered wheezes cardiovascular system:RRR. S1, S2 Gastrointestinal system: Increased truncal adiposity,, ND, soft and nontender.  Normal bowel sounds heard. Central nervous system: Alert and oriented. No focal neurological deficits. Extremities: No cyanosis or clubbing; trace-1+ edema bilaterally, no joint swelling, no deformity. Skin: No petechiae. Psychiatry: Judgement and insight appear normal. Mood & affect appropriate.   Data Reviewed:   CBC: Recent Labs  Lab 01/01/19 0555  WBC 8.8  NEUTROABS 6.9  HGB 11.5*  HCT 37.2  MCV 97.1  PLT 814   Basic Metabolic Panel: Recent Labs  Lab 01/01/19 0555 01/03/19 0650 01/04/19 0522 01/05/19 0455 01/06/19 0456  NA 139 135 137 139 138  K 4.3 3.9 3.5 3.9 4.0  CL 99 95* 95* 94* 95*  CO2 29 29 32 33* 31  GLUCOSE 219* 184* 173* 177* 159*  BUN 20 21 26* 34* 34*  CREATININE 1.09* 0.92 0.94 1.06* 0.96  CALCIUM 8.8* 8.6* 8.7* 8.6* 8.8*   GFR: Estimated Creatinine Clearance: 60.9 mL/min (by C-G formula based on SCr of 0.96 mg/dL).   Liver Function Tests: Recent Labs  Lab 01/01/19 0555  AST 24  ALT 26  ALKPHOS 58  BILITOT 0.7  PROT 7.5  ALBUMIN  3.1*   Coagulation Profile: Recent Labs  Lab 01/02/19 0406 01/03/19 0650 01/04/19 0619 01/05/19 0455 01/06/19 0456  INR 2.6* 2.2* 2.4* 2.4* 2.5*   CBG: Recent Labs  Lab 01/05/19 2217 01/06/19 0737 01/06/19 0752 01/06/19 1131 01/06/19 1624  GLUCAP 256* 125* 126* 207* 270*   Urine analysis:    Component Value Date/Time   COLORURINE AMBER (A) 12/08/2018 2046   APPEARANCEUR CLOUDY (A) 12/08/2018 2046   LABSPEC 1.019 12/08/2018 2046   PHURINE 5.0 12/08/2018 2046   GLUCOSEU NEGATIVE 12/08/2018 2046   HGBUR NEGATIVE 12/08/2018 2046   BILIRUBINUR NEGATIVE 12/08/2018  2046   KETONESUR 5 (A) 12/08/2018 2046   PROTEINUR 30 (A) 12/08/2018 2046   UROBILINOGEN 0.2 09/05/2014 2015   NITRITE POSITIVE (A) 12/08/2018 2046   LEUKOCYTESUR TRACE (A) 12/08/2018 2046    Recent Results (from the past 240 hour(s))  Blood culture (routine x 2)     Status: None   Collection Time: 01/01/19  5:55 AM   Specimen: BLOOD  Result Value Ref Range Status   Specimen Description BLOOD RIGHT ARM  Final   Special Requests   Final    BOTTLES DRAWN AEROBIC AND ANAEROBIC Blood Culture adequate volume   Culture   Final    NO GROWTH 5 DAYS Performed at [facility], [address] [place], [place] [address]    Report Status 01/06/2019 FINAL  Final  SARS Coronavirus 2 by RT PCR (hospital order, performed in [facility]) Nasopharyngeal Nasopharyngeal Swab     Status: Abnormal   Collection Time: 01/01/19  7:37 AM   Specimen: Nasopharyngeal Swab  Result Value Ref Range Status   SARS Coronavirus 2 POSITIVE (A) NEGATIVE Final    Comment: RESULT CALLED TO, READ BACK BY AND VERIFIED WITH: WHITE M. @ 0905 ON [id number] BY HENDERSON L. (NOTE) SARS-CoV-2 target nucleic acids are DETECTED SARS-CoV-2 RNA is generally detectable in upper respiratory specimens  during the acute phase of infection.  Positive results are indicative  of the presence of the identified virus, but do not rule out bacterial infection or co-infection with other pathogens not detected by the test.  Clinical correlation with patient history and  other diagnostic information is necessary to determine patient infection status.  The expected result is negative. Fact Sheet for Patients:   BoilerBrush.com.cyhttps://www.fda.gov/media/136312/download  Fact Sheet for Healthcare Providers:   https://pope.com/https://www.fda.gov/media/136313/download   This test is not yet approved or cleared by the [place] FDA and  has been authorized for detection and/or diagnosis of SARS-CoV-2 by FDA under an Emergency Use Authorization (EUA).  This EUA  will remain in effect (meaning this t est can be used) for the duration of  the COVID-19 declaration under Section 564(b)(1) of the Act, 21 U.S.C. section 360-bbb-3(b)(1), unless the authorization is terminated or revoked sooner. Performed at [facility], [address] [place], [place] [address]   Blood culture (routine x 2)     Status: None   Collection Time: 01/01/19  7:42 AM   Specimen: BLOOD RIGHT FOREARM  Result Value Ref Range Status   Specimen Description   Final    BLOOD RIGHT FOREARM BOTTLES DRAWN AEROBIC AND ANAEROBIC   Special Requests   Final    Blood Culture results may not be optimal due to an inadequate volume of blood received in culture bottles   Culture   Final    NO GROWTH 5 DAYS Performed at [facility], [address] [place], [place] [address]    Report  Status 01/06/2019 FINAL  Final  MRSA PCR Screening     Status: None   Collection Time: 01/01/19  5:20 PM   Specimen: Nasal Mucosa; Nasopharyngeal  Result Value Ref Range Status   MRSA by PCR NEGATIVE NEGATIVE Final    Comment:        The GeneXpert MRSA Assay (FDA approved for NASAL specimens only), is one component of a comprehensive MRSA colonization surveillance program. It is not intended to diagnose MRSA infection nor to guide or monitor treatment for MRSA infections. Performed at [facility], [address] [place], [place] [address]      Radiology Studies: No results found. Scheduled Meds: . amLODipine  10 mg Oral Daily  . carvedilol  3.125 mg Oral BID WC  . Chlorhexidine Gluconate Cloth  6 each Topical Daily  . clomiPRAMINE  25 mg Oral QID  . clonazePAM  1 mg Oral QHS  . diclofenac Sodium  4 g Topical TID  . docusate sodium  100 mg Oral BID  . DULoxetine  60 mg Oral Daily  . furosemide  40 mg Intravenous Q12H  . gabapentin  300 mg Oral BID  . insulin aspart  0-15 Units Subcutaneous TID WC  . insulin aspart  0-5 Units Subcutaneous QHS  . insulin aspart  5 Units Subcutaneous TID WC   . insulin glargine  18 Units Subcutaneous Daily  . mirtazapine  7.5 mg Oral QHS  . multivitamin with minerals  1 tablet Oral Daily  . nystatin   Topical TID  . polyethylene glycol  17 g Oral Daily  . potassium chloride  10 mEq Oral Daily  . predniSONE  40 mg Oral Q breakfast  . QUEtiapine  100 mg Oral QHS  . sodium chloride flush  3 mL Intravenous Q12H  . vitamin C  500 mg Oral Daily  . Warfarin - Pharmacist Dosing Inpatient   Does not apply q1800  . zinc sulfate  220 mg Oral Daily   Continuous Infusions: . sodium chloride Stopped (01/01/19 1701)     LOS: 5 days   Shon Hale, MD [facility] Pager [phone]   01/06/2019, 6:24 PM

## 2019-01-06 NOTE — Progress Notes (Signed)
Patient noted to have HR 118 and O2 sats 84% on 4 lpm documented with afternoon vitals. Assessed patient, vitals signs obtained. HR 61 via telemetry monitor, O2 sats 98% on 4 lpm, had some difficulty getting O2 sats to read due to cold fingers. Patient denies pain or any distress. States comfortable resting in bed. Was assisted up to bed for lunch and tolerated sitting up in chair for about 3 hours. Notified MD of activity, O2 sats and HR. No new orders at this time. Donavan Foil, RN

## 2019-01-06 NOTE — Progress Notes (Signed)
ANTICOAGULATION CONSULT NOTE - follow up [place] for warfarin Indication: hx VTE  Allergies  Allergen Reactions  . Codeine Anxiety  . Compazine Anaphylaxis  . Other Anaphylaxis, Rash and Other (See Comments)    Uncoded Allergy. Allergen: INOVAR Uncoded Allergy. Allergen: ALL ADHESIVES Uncoded Allergy. Allergen: SILK SUTURE Uncoded Allergy. Allergen: merthiolate Thermasol  . Penicillins Shortness Of Breath and Rash    Has patient had a PCN reaction causing immediate rash, facial/tongue/throat swelling, SOB or lightheadedness with hypotension: Yes Has patient had a PCN reaction causing severe rash involving mucus membranes or skin necrosis: No Has patient had a PCN reaction that required hospitalization No Has patient had a PCN reaction occurring within the last 10 years: Yes If all of the above answers are "NO", then may proceed with Cephalosporin use.   Marland Kitchen Morphine And Related Other (See Comments)    Patient states that it makes her lose her state of mind.   . Demerol [Meperidine] Rash    Patient Measurements: Height: 5\' 8"  (172.7 cm) Weight: 236 lb 1.8 oz (107.1 kg) IBW/kg (Calculated) : 63.9 Vital Signs: Temp: 97.7 F (36.5 C) (12/01 2221) Temp Source: Oral (12/01 2221) BP: 137/59 (12/02 0556) Pulse Rate: 59 (12/02 0556)  Labs: Recent Labs    01/04/19 0522 01/04/19 0619 01/05/19 0455 01/06/19 0456  LABPROT  --  25.9* 26.2* 26.7*  INR  --  2.4* 2.4* 2.5*  CREATININE 0.94  --  1.06* 0.96    Estimated Creatinine Clearance: 60.9 mL/min (by C-G formula based on SCr of 0.96 mg/dL).   Medical History: Past Medical History:  Diagnosis Date  . Aortic stenosis   . Cataplexy   . Diastolic heart failure ([place])   . Essential hypertension   . GERD (gastroesophageal reflux disease)   . Hiatal hernia   . History of recurrent deep vein thrombosis (DVT)   . Mitral stenosis   . Narcolepsy   . Neuropathy   . Renal disorder   . Type 2 diabetes mellitus  (HCC)     Medications:  Medications Prior to Admission  Medication Sig Dispense Refill Last Dose  . acetaminophen (TYLENOL) 500 MG tablet Take 1,000 mg by mouth 2 (two) times daily as needed for mild pain.    12/31/2018 at Unknown time  . carvedilol (COREG) 3.125 MG tablet TAKE 1 TABLET BY MOUTH TWICE DAILY WITH A MEAL (Patient taking differently: Take 3.125 mg by mouth 2 (two) times daily with a meal. ) 60 tablet 6 12/31/2018 at 1700  . Cholecalciferol (VITAMIN D3) 3000 UNITS TABS Take 1 capsule by mouth daily.   12/31/2018 at Unknown time  . clomiPRAMINE (ANAFRANIL) 25 MG capsule Take 25 mg by mouth 4 (four) times daily.    12/31/2018 at 2000  . clonazePAM (KLONOPIN) 1 MG tablet Take 1 tablet (1 mg total) by mouth at bedtime.   12/31/2018 at 2000  . DULoxetine (CYMBALTA) 60 MG capsule Take 60 mg by mouth daily.     12/31/2018  . ferrous sulfate 325 (65 FE) MG tablet Take 325 mg by mouth 2 (two) times daily with a meal.    12/31/2018 at 2000  . furosemide (LASIX) 40 MG tablet Take 20 mg by mouth 2 (two) times daily.   5 12/31/2018 at 1700  . gabapentin (NEURONTIN) 300 MG capsule Take 300 mg by mouth 2 (two) times daily.   12/31/2018 at 2000  . insulin glargine (LANTUS) 100 unit/mL SOPN Inject 24 Units into the skin daily.  12/31/2018 at 2000  . KLOR-CON M10 10 MEQ tablet Take 10 mEq by mouth daily.   12/31/2018 at Unknown time  . magnesium oxide (MAG-OX) 400 MG tablet Take 400 mg by mouth daily.   12/31/2018 at Unknown time  . metFORMIN (GLUCOPHAGE) 500 MG tablet Take 500 mg by mouth 2 (two) times daily with a meal.   12/31/2018 at 1700  . mirtazapine (REMERON) 7.5 MG tablet Take 1 tablet (7.5 mg total) by mouth at bedtime. (Patient taking differently: Take 15 mg by mouth at bedtime. ) 30 tablet 0 12/31/2018 at 2000  . Multiple Vitamin (MULTIVITAMIN WITH MINERALS) TABS tablet Take 1 tablet by mouth daily.   12/31/2018 at Unknown time  . QUEtiapine (SEROQUEL) 100 MG tablet Take 100 mg by mouth  at bedtime.   12/31/2018 at 2000  . vitamin C (ASCORBIC ACID) 500 MG tablet Take 500 mg by mouth daily.   12/31/2018 at Unknown time  . warfarin (COUMADIN) 5 MG tablet Take 2.5-5 mg by mouth as directed. Take 2.5mg  one day, then alternates by taking 5mg  the next day   12/31/2018 at 2000  . zinc sulfate 220 (50 Zn) MG capsule Take 220 mg by mouth daily.   12/31/2018 at Unknown time  . BYDUREON 2 MG PEN Inject 2 mg into the skin once a week.  5 Not Taking at Unknown time  . clobetasol cream (TEMOVATE) 0.05 % Apply 1 application topically daily.        Assessment: Pharmacy consulted to dose warfarin in patient with history of DVT.  Patient's listed home dose of warfarin is alternating 2.5 mg and 5 mg every other day (5 mg on 11/26)  INR today therapeutic at 2.5  Goal of Therapy:  INR 2-3 Monitor platelets by anticoagulation protocol: Yes   Plan:  Warfarin 5 mg x 1 dose. Monitor daily INR and s/s of bleeding.  12/26, PharmD Clinical Pharmacist 01/06/2019 9:13 AM

## 2019-01-07 LAB — PROTIME-INR
INR: 2.2 — ABNORMAL HIGH (ref 0.8–1.2)
Prothrombin Time: 24.4 seconds — ABNORMAL HIGH (ref 11.4–15.2)

## 2019-01-07 LAB — RENAL FUNCTION PANEL
Albumin: 3 g/dL — ABNORMAL LOW (ref 3.5–5.0)
Anion gap: 13 (ref 5–15)
BUN: 35 mg/dL — ABNORMAL HIGH (ref 8–23)
CO2: 32 mmol/L (ref 22–32)
Calcium: 9.2 mg/dL (ref 8.9–10.3)
Chloride: 95 mmol/L — ABNORMAL LOW (ref 98–111)
Creatinine, Ser: 0.96 mg/dL (ref 0.44–1.00)
GFR calc Af Amer: >60 mL/min (ref >60)
GFR calc non Af Amer: 56 mL/min — ABNORMAL LOW (ref >60)
Glucose, Bld: 97 mg/dL (ref 70–99)
Phosphorus: 4.1 mg/dL (ref 2.5–4.6)
Potassium: 3.9 mmol/L (ref 3.5–5.1)
Sodium: 140 mmol/L (ref 135–145)

## 2019-01-07 LAB — HEMOGLOBIN AND HEMATOCRIT, BLOOD
HCT: 37.5 % (ref 36.0–46.0)
Hemoglobin: 11.5 g/dL — ABNORMAL LOW (ref 12.0–15.0)

## 2019-01-07 LAB — GLUCOSE, CAPILLARY
Glucose-Capillary: 93 mg/dL (ref 70–99)
Glucose-Capillary: 229 mg/dL — ABNORMAL HIGH (ref 70–99)

## 2019-01-07 MED ORDER — CLOMIPRAMINE HCL 25 MG PO CAPS: 25.0000 mg | ORAL_CAPSULE | Freq: Two times a day (BID) | ORAL | 2 refills | Status: AC

## 2019-01-07 MED ORDER — NOVOLOG FLEXPEN 100 UNIT/ML ~~LOC~~ SOPN: 0.0000 [IU] | PEN_INJECTOR | Freq: Three times a day (TID) | SUBCUTANEOUS | 11 refills | Status: DC

## 2019-01-07 MED ORDER — PREDNISONE 20 MG PO TABS: 20.0000 mg | ORAL_TABLET | ORAL | 0 refills | Status: DC

## 2019-01-07 MED ORDER — INSULIN GLARGINE 100 UNITS/ML SOLOSTAR PEN: 20.0000 [IU] | PEN_INJECTOR | Freq: Every day | SUBCUTANEOUS | 11 refills | Status: AC

## 2019-01-07 MED ORDER — POTASSIUM CHLORIDE ER 10 MEQ PO TBCR: 10.0000 meq | EXTENDED_RELEASE_TABLET | Freq: Every day | ORAL | 3 refills | Status: AC

## 2019-01-07 MED ORDER — WARFARIN SODIUM 2.5 MG PO TABS: 2.5000 mg | ORAL_TABLET | Freq: Once | ORAL | Status: DC

## 2019-01-07 MED ORDER — FUROSEMIDE 40 MG PO TABS: 20.0000 mg | ORAL_TABLET | Freq: Two times a day (BID) | ORAL | 5 refills | Status: DC

## 2019-01-07 MED ORDER — CARVEDILOL 3.125 MG PO TABS: 3.1250 mg | ORAL_TABLET | Freq: Two times a day (BID) | ORAL | 4 refills | Status: DC

## 2019-01-07 NOTE — Discharge Instructions (Signed)
1) NovoLog sliding scale insulin as ordered -insulin aspart (novoLOG) injection 0-8 Units 0-8 Units,  Subcutaneous, 3 times daily with meals, CBG < 70: Implement Hypoglycemia Standing Orders and refer to Hypoglycemia Standing Orders sidebar report  CBG 70 - 120: 0 units  CBG 121 - 150: 0 unit  CBG 151 - 200: 2 units  CBG 201 - 250: 3 units  CBG 251 - 300: 4 units  CBG 301 - 350: 6 units  CBG 351 - 400: 8 units CBG > 400:  2) you are taking Coumadin/warfarin which is a blood thinner so Avoid ibuprofen/Advil/Aleve/Motrin/Goody Powders/Naproxen/BC powders/Meloxicam/Diclofenac/Indomethacin and other Nonsteroidal anti-inflammatory medications as these will make you more likely to bleed and can cause stomach ulcers, can also cause Kidney problems.   3) please note that there has been several changes to your medications--your clomipramine has been reduced twice a day from 4 times a day, your insulin regimen has been adjusted, your blood pressure medications have also been adjusted  4) continue to use oxygen continuously at 2 to 3 L/min via nasal cannula with humidifier  5) please recheck PT/INR and CBC test on Monday, 01/11/2019

## 2019-01-07 NOTE — Progress Notes (Signed)
ANTICOAGULATION CONSULT NOTE - follow up Consult  Pharmacy Consult for warfarin Indication: hx VTE  Allergies  Allergen Reactions  . Codeine Anxiety  . Compazine Anaphylaxis  . Other Anaphylaxis, Rash and Other (See Comments)    Uncoded Allergy. Allergen: INOVAR Uncoded Allergy. Allergen: ALL ADHESIVES Uncoded Allergy. Allergen: SILK SUTURE Uncoded Allergy. Allergen: merthiolate Thermasol  . Penicillins Shortness Of Breath and Rash    Has patient had a PCN reaction causing immediate rash, facial/tongue/throat swelling, SOB or lightheadedness with hypotension: Yes Has patient had a PCN reaction causing severe rash involving mucus membranes or skin necrosis: No Has patient had a PCN reaction that required hospitalization No Has patient had a PCN reaction occurring within the last 10 years: Yes If all of the above answers are "NO", then may proceed with Cephalosporin use.   Marland Kitchen Morphine And Related Other (See Comments)    Patient states that it makes her lose her state of mind.   . Demerol [Meperidine] Rash    Patient Measurements: Height: 5\' 8"  (172.7 cm) Weight: 235 lb 14.3 oz (107 kg) IBW/kg (Calculated) : 63.9 Vital Signs: Temp: 98.1 F (36.7 C) (12/03 0525) Temp Source: Oral (12/02 2054) BP: 124/57 (12/03 0525) Pulse Rate: 61 (12/03 0525)  Labs: Recent Labs    01/05/19 0455 01/06/19 0456 01/07/19 0455  HGB  --   --  11.5*  HCT  --   --  37.5  LABPROT 26.2* 26.7* 24.4*  INR 2.4* 2.5* 2.2*  CREATININE 1.06* 0.96 0.96    Estimated Creatinine Clearance: 60.8 mL/min (by C-G formula based on SCr of 0.96 mg/dL).   Medical History: Past Medical History:  Diagnosis Date  . Aortic stenosis   . Cataplexy   . Diastolic heart failure (HCC)   . Essential hypertension   . GERD (gastroesophageal reflux disease)   . Hiatal hernia   . History of recurrent deep vein thrombosis (DVT)   . Mitral stenosis   . Narcolepsy   . Neuropathy   . Renal disorder   . Type 2  diabetes mellitus (HCC)     Medications:  Medications Prior to Admission  Medication Sig Dispense Refill Last Dose  . acetaminophen (TYLENOL) 500 MG tablet Take 1,000 mg by mouth 2 (two) times daily as needed for mild pain.    12/31/2018 at Unknown time  . carvedilol (COREG) 3.125 MG tablet TAKE 1 TABLET BY MOUTH TWICE DAILY WITH A MEAL (Patient taking differently: Take 3.125 mg by mouth 2 (two) times daily with a meal. ) 60 tablet 6 12/31/2018 at 1700  . Cholecalciferol (VITAMIN D3) 3000 UNITS TABS Take 1 capsule by mouth daily.   12/31/2018 at Unknown time  . clomiPRAMINE (ANAFRANIL) 25 MG capsule Take 25 mg by mouth 4 (four) times daily.    12/31/2018 at 2000  . clonazePAM (KLONOPIN) 1 MG tablet Take 1 tablet (1 mg total) by mouth at bedtime.   12/31/2018 at 2000  . DULoxetine (CYMBALTA) 60 MG capsule Take 60 mg by mouth daily.     12/31/2018  . ferrous sulfate 325 (65 FE) MG tablet Take 325 mg by mouth 2 (two) times daily with a meal.    12/31/2018 at 2000  . furosemide (LASIX) 40 MG tablet Take 20 mg by mouth 2 (two) times daily.   5 12/31/2018 at 1700  . gabapentin (NEURONTIN) 300 MG capsule Take 300 mg by mouth 2 (two) times daily.   12/31/2018 at 2000  . insulin glargine (LANTUS) 100 unit/mL SOPN Inject  24 Units into the skin daily.    12/31/2018 at 2000  . KLOR-CON M10 10 MEQ tablet Take 10 mEq by mouth daily.   12/31/2018 at Unknown time  . magnesium oxide (MAG-OX) 400 MG tablet Take 400 mg by mouth daily.   12/31/2018 at Unknown time  . metFORMIN (GLUCOPHAGE) 500 MG tablet Take 500 mg by mouth 2 (two) times daily with a meal.   12/31/2018 at 1700  . mirtazapine (REMERON) 7.5 MG tablet Take 1 tablet (7.5 mg total) by mouth at bedtime. (Patient taking differently: Take 15 mg by mouth at bedtime. ) 30 tablet 0 12/31/2018 at 2000  . Multiple Vitamin (MULTIVITAMIN WITH MINERALS) TABS tablet Take 1 tablet by mouth daily.   12/31/2018 at Unknown time  . QUEtiapine (SEROQUEL) 100 MG tablet  Take 100 mg by mouth at bedtime.   12/31/2018 at 2000  . vitamin C (ASCORBIC ACID) 500 MG tablet Take 500 mg by mouth daily.   12/31/2018 at Unknown time  . warfarin (COUMADIN) 5 MG tablet Take 2.5-5 mg by mouth as directed. Take 2.5mg  one day, then alternates by taking 5mg  the next day   12/31/2018 at 2000  . zinc sulfate 220 (50 Zn) MG capsule Take 220 mg by mouth daily.   12/31/2018 at Unknown time  . BYDUREON 2 MG PEN Inject 2 mg into the skin once a week.  5 Not Taking at Unknown time  . clobetasol cream (TEMOVATE) 4.09 % Apply 1 application topically daily.        Assessment: Pharmacy consulted to dose warfarin in patient with history of DVT.  Patient's listed home dose of warfarin is alternating 2.5 mg and 5 mg every other day (5 mg on 11/26)  INR today therapeutic at 2.2  Goal of Therapy:  INR 2-3 Monitor platelets by anticoagulation protocol: Yes   Plan:  Warfarin 2.5 mg x 1 dose. Monitor daily INR and s/s of bleeding.  Margot Ables, PharmD Clinical Pharmacist 01/07/2019 8:26 AM

## 2019-01-07 NOTE — TOC Transition Note (Signed)
Transition of Care [facility]) - CM/SW Discharge Note   Patient Details  Name: Rebecca [name] MRN: [id number] Date of Birth: 12/07/1940  [facility]) CM/SW Contact:  Boneta Lucks, RN Phone Number: 01/07/2019, 11:27 AM   Clinical Narrative:   Patient is medically ready has a bed offer at [facility] in [place].  Spoke with Norva Riffle daughter they want to take her home with home health.  She lives with her son, he will be there at night and [name] lives next door and will take care of her during the day.  Patient is on Home oxygen. Had orders from PCP for [place].  MD will updated order for RN/PT. [facility] to update with discharge plan.      Final next level of care: [place] Barriers to Discharge: Barriers Resolved   Patient Goals and CMS Choice Patient states their goals for this hospitalization and ongoing recovery are:: to go home with Home health. CMS Medicare.gov Compare Post Acute Care list provided to:: Patient Represenative (must comment) Choice offered to / list presented to : Adult Children(grand daughter)  Discharge Placement                  Name of family member notified: [name] Patient and family notified of of transfer: 01/07/19  Discharge Plan and Services   Discharge Planning Services: CM Consult             HH Arranged: RN, PT [facility] Agency: [place] ([place]) Date HH Agency Contacted: 01/07/19 Time [place]: 1118 Representative spoke with at [place]: [place] ([place]) Interventions     Readmission Risk Interventions Readmission Risk Prevention Plan 01/04/2019  Transportation Screening Complete  HRI or [place] Complete  Social Work Consult for [place] Planning/Counseling Complete  Palliative Care Screening Not Applicable  Medication Review Press photographer) Complete  Some recent data might be hidden

## 2019-01-07 NOTE — TOC Progression Note (Signed)
Home Health Care Choices:   [place] [phone] Quality rating Patient survey rating 2. [place] [phone] Quality rating Patient survey rating 3. [place] [phone] Quality rating Patient survey rating 4. [place] [phone] Quality rating Patient survey rating 5. [place] [phone] Quality rating Patient survey rating 6. [place] [phone] Quality rating Patient survey rating 7. [place] [phone] Quality rating Patient survey rating 8. [place] [phone] Quality rating Patient survey rating 9. [place] [phone] Quality rating Patient survey rating 11 10. [facility] [phone] Quality rating Patient survey rating 11. [place] [phone] Quality rating Patient survey rating 12. [place] [phone] Quality rating Patient survey rating 13. [facility] [phone] Quality rating Patient survey rating Not available11 14. [place] [phone]

## 2019-01-07 NOTE — Care Management Important Message (Signed)
Important Message  Patient Details  Name: Rebecca [name] MRN: [id number] Date of Birth: 30-Dec-1940   Medicare Important Message Given:  Yes     Tommy Medal 01/07/2019, 12:50 PM

## 2019-01-07 NOTE — Discharge Summary (Signed)
Rebecca [name], is a 78 y.o. female  DOB [birth year]  MRN [id number].  Admission date:  01/01/2019  Admitting Physician  Vassie Loll, MD  Discharge Date:  01/07/2019   Primary MD  [name], MD  Recommendations for primary care physician for things to follow:   1) NovoLog sliding scale insulin as ordered -insulin aspart (novoLOG) injection 0-8 Units 0-8 Units,  Subcutaneous, 3 times daily with meals, CBG < 70: Implement Hypoglycemia Standing Orders and refer to Hypoglycemia Standing Orders sidebar report  CBG 70 - 120: 0 units  CBG 121 - 150: 0 unit  CBG 151 - 200: 2 units  CBG 201 - 250: 3 units  CBG 251 - 300: 4 units  CBG 301 - 350: 6 units  CBG 351 - 400: 8 units CBG > 400:  2) you are taking Coumadin/warfarin which is a blood thinner so Avoid ibuprofen/Advil/Aleve/Motrin/Goody Powders/Naproxen/BC powders/Meloxicam/Diclofenac/Indomethacin and other Nonsteroidal anti-inflammatory medications as these will make you more likely to bleed and can cause stomach ulcers, can also cause Kidney problems.   3) please note that there has been several changes to your medications--your clomipramine has been reduced twice a day from 4 times a day, your insulin regimen has been adjusted, your blood pressure medications have also been adjusted  4) continue to use oxygen continuously at 2 to 3 L/min via nasal cannula with humidifier  5) please recheck PT/INR and CBC test on Monday, 01/11/2019  Admission Diagnosis  Hypoxia [R09.02] HCAP (healthcare-associated pneumonia) [J18.9]   Discharge Diagnosis  Hypoxia [R09.02] HCAP (healthcare-associated pneumonia) [J18.9]   Active Problems:   Acute on chronic respiratory failure with hypoxia (HCC)   Palliative care by specialist   Goals of care, counseling/discussion      Past Medical History:  Diagnosis Date   Aortic stenosis    Cataplexy     Diastolic heart failure (HCC)    Essential hypertension    GERD (gastroesophageal reflux disease)    Hiatal hernia    History of recurrent deep vein thrombosis (DVT)    Mitral stenosis    Narcolepsy    Neuropathy    Renal disorder    Type 2 diabetes mellitus (HCC)     Past Surgical History:  Procedure Laterality Date   ABDOMINAL HYSTERECTOMY     CARPAL TUNNEL RELEASE     CARPECTOMY  12/26/2011   Procedure: CARPECTOMY;  Surgeon: Wyn Forster., MD;  Location: [facility];  Service: Orthopedics;  Laterality: Right;  PROXIMAL ROW CARPECTOMY RIGHT WRIST and neurectomy   CHOLECYSTECTOMY     HEMORROIDECTOMY     KIDNEY SURGERY     NEPHROSTOMY     PACEMAKER IMPLANT N/A 10/25/2016   Procedure: Pacemaker Implant;  Surgeon: Regan Lemming, MD;  Location: [facility];  Service: Cardiovascular;  Laterality: N/A;   TOTAL HIP ARTHROPLASTY     bilateral       HPI  from the history and physical done on the day of admission:    -  Chief Complaint: SOB and general malaise.   HPI: Rebecca [name] is a 77 y.o. female with a past medical history significant for essential hypertension, chronic diastolic heart failure, aortic stenosis, cataplexy, morbid obesity, narcolepsy, history of recurrent DVTs, gastroesophageal reflux disease, type 2 diabetes mellitus with nephropathy, chronic kidney disease a stage IIIa and a recent admission secondary to Covid pneumonia; who presented to the hospital secondary to worsening shortness of breath and nonproductive cough.  Patient also expressed general malaise and increased lower extremity swelling.  She reports that for the last 2 days the breathing compromises to the point that her oxygen supplementation was not enough.  Patient reported sudden chest tightness sensation on the night prior to admission and expressed mild orthopnea.   Patient denies any nausea, vomiting, diarrhea, abdominal pain, fever, chills, chest  pain, hematemesis, hematochezia, dysuria and hematuria.  Work-up in the ED demonstrated elevated BNP (even lower than prior levels), oxygen supplementation at the time the EMS picked her up to be in the mid 70s and transiently requiring nonrebreather mask.  Chest x-ray with multifocal infiltrates, vascular congestion and interstitial edema.  Blood work demonstrated normal WBCs, patient is afebrile, elevated fibrinogen of 564, CBG of 226, LDH 199,CRP 6.5, D-dimer 1.68, procalcitonin level < 0.10, repeat Covid test by PCR positive; negative high sensitive troponin X 2.  Blood cultures were taken, IV antibiotics broad-spectrum initiated and [facility] contacted to admit patient for further evaluation and management.    Hospital Course:   Brief Narrative:  78 y.o.femalewith a past medical history significant for essential hypertension, chronic diastolic heart failure, aortic stenosis, cataplexy, morbid obesity, narcolepsy, history of recurrent DVTs, gastroesophageal reflux disease, type 2 diabetes mellitus with nephropathy, chronic kidney disease a stage IIIaand a recent admission secondary to Covid pneumonia;who presented to the hospital secondary to worsening shortness of breath and nonproductive cough. Patient also expressed general malaise and increased lower extremity swelling. She reports that for the last 2 days the breathing compromises to the point that her oxygen supplementation was not enough. Patient reported sudden chest tightness sensation on the night prior to admission and expressed mild orthopnea.  Patient denies any nausea, vomiting, diarrhea, abdominal pain, fever, chills, chest pain, hematemesis, hematochezia, dysuria and hematuria.  Work-up in the ED demonstrated elevated BNP (even lower than prior levels), oxygen supplementation at the time the EMS picked her up to be in the mid 70s and transiently requiring nonrebreather mask. Chest x-ray with multifocal infiltrates, vascular  congestion and interstitial edema. Blood work demonstrated normal WBCs, patient is afebrile, elevated fibrinogen of 564, CBG of 226, LDH 199,CRP 6.5, D-dimer 1.68,procalcitonin level<0.10,repeat Covid test by PCR positive;negativehighsensitive troponinX 2.Blood cultures were taken, IV antibiotics broad-spectrum initiated and [facility] contacted to admit patient for further evaluation and management.  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 Improved with IV Lasix and steroids  -Continue prednisone taper- PTA patient was on 2 L of oxygen via nasal cannula at home  -Oxygen requirement appears back to baseline of 2 L/min  2-Acute on chronic diastolic heart failure -Much improved with IV diuresis Lasix  -Okay to discharge on p.o. Lasix -Fluid balance has been negative, weight chart has not been accurate  3-Paroxysmal atrial fibrillation -Continue Coreg 3.125 mg twice daily  for rate control -Continue warfarin for stroke prevention; -INR within normal limits. -Repeat PT/INR on 01/11/2019  4-Essential hypertension -Overall stable -  Coreg 12.5 mg twice daily, and Lasix as above -Okay to hold amlodipine.  5-GERD-stable, reflux disease -Continue PPI.  6-depression and/anxiety -Continue the use of Cymbalta, Seroquel and as needed Klonopin. -Overall mood is a stable.  7-generalized weakness and physical deconditioning -Physical therapy has evaluated patient and is recommending skilled nursing facility at discharge for further rehabilitation. -SNF rehab recommended however patient and family declined SNF rehab patient's the family elected to go home with home health  services   8-obesity -Body mass index is 35.9 kg/m. -Low calorie diet, portion control and increase physical activity discussed with patient.   9-type 2 diabetes with nephropathy -A1c 8.2 reflecting uncontrolled diabetes, -Continue sliding scale insulin and Lantus -Adjustments have been made to the regimen.   10-bilateral knee pain -secondary to OA -no swelling, no erythema or other significant inflammatory changes appreciated. -Tylenol as needed patient has used diclofenac gel   -History of narcolepsy--patient on multiple medications that potentially could put her at risk for serotonin syndrome--decrease clomipramine to 25 mg twice daily from 4 times daily -Stop Remeron due to concerns about increased daytime sleepiness on Remeron  DVT prophylaxis: Chronically on warfarin. Code Status: Full code Family Communication:  Left messages for BOTH patient's son (Robert---[phone])  and patient's granddaughter [phone]) Disposition Plan:  SNF rehab recommended however patient and family declined SNF rehab patient's the family elected to go home with home health services  Consultants:   None  Procedures:   See below for x-ray reports.  Discharge Condition: stable  Follow UP   Contact information for follow-up providers    [facility] Follow [facility].   Specialty: Home Health Services Why: RN/ PT           Contact information for after-discharge care    Destination    [facility] Preferred SNF .   Service: Skilled Nursing [address] [place] [id number] [phone]                   Diet and Activity recommendation:  As advised  Discharge Instructions    Discharge Instructions    Call MD for:  difficulty breathing, headache or visual disturbances   Complete by: As directed    Call MD for:  persistant dizziness or light-headedness   Complete by: As directed    Call MD  for:  persistant nausea and vomiting   Complete by: As directed    Call MD for:  temperature >100.4   Complete by: As directed    Diet - low sodium heart healthy   Complete by: As directed    Diet Carb Modified   Complete by: As directed    Discharge instructions   Complete by: As directed    1) NovoLog sliding scale insulin as ordered -insulin aspart (novoLOG) injection 0-8 Units 0-8 Units,  Subcutaneous, 3 times daily with meals, CBG < 70: Implement Hypoglycemia Standing Orders and refer to Hypoglycemia Standing Orders sidebar report  CBG 70 - 120: 0 units  CBG 121 - 150: 0 unit  CBG 151 - 200: 2 units  CBG 201 - 250: 3 units  CBG 251 - 300: 4 units  CBG 301 - 350: 6 units  CBG 351 - 400: 8 units CBG > 400:  2) you are taking Coumadin/warfarin which is a blood thinner so Avoid ibuprofen/Advil/Aleve/Motrin/Goody  Powders/Naproxen/BC powders/Meloxicam/Diclofenac/Indomethacin and other Nonsteroidal anti-inflammatory medications as these will make you more likely to bleed and can cause stomach ulcers, can also cause Kidney problems.   3) please note that there has been several changes to your medications--your clomipramine has been reduced twice a day from 4 times a day, your insulin regimen has been adjusted, your blood pressure medications have also been adjusted  4) continue to use oxygen continuously at 2 to 3 L/min via nasal cannula with humidifier  5) please recheck PT/INR and CBC test on Monday, 01/11/2019   Increase activity slowly   Complete by: As directed         Discharge Medications     Allergies as of 01/07/2019      Reactions   Codeine Anxiety   Compazine Anaphylaxis   Other Anaphylaxis, Rash, Other (See Comments)   Uncoded Allergy. Allergen: INOVAR Uncoded Allergy. Allergen: ALL ADHESIVES Uncoded Allergy. Allergen: SILK SUTURE Uncoded Allergy. Allergen: merthiolate Thermasol   Penicillins Shortness Of Breath, Rash   Has patient had a PCN reaction causing  immediate rash, facial/tongue/throat swelling, SOB or lightheadedness with hypotension: Yes Has patient had a PCN reaction causing severe rash involving mucus membranes or skin necrosis: No Has patient had a PCN reaction that required hospitalization No Has patient had a PCN reaction occurring within the last 10 years: Yes If all of the above answers are "NO", then may proceed with Cephalosporin use.   Morphine And Related Other (See Comments)   Patient states that it makes her lose her state of mind.    Demerol [meperidine] Rash      Medication List    STOP taking these medications   Klor-Con M10 10 MEQ tablet Generic drug: potassium chloride   mirtazapine 7.5 MG tablet Commonly known as: REMERON     TAKE these medications   acetaminophen 500 MG tablet Commonly known as: TYLENOL Take 1,000 mg by mouth 2 (two) times daily as needed for mild pain.   Bydureon 2 MG Pen Generic drug: Exenatide ER Inject 2 mg into the skin once a week.   carvedilol 3.125 MG tablet Commonly known as: COREG Take 1 tablet (3.125 mg total) by mouth 2 (two) times daily with a meal.   clobetasol cream 0.05 % Commonly known as: TEMOVATE Apply 1 application topically daily.   clomiPRAMINE 25 MG capsule Commonly known as: ANAFRANIL Take 1 capsule (25 mg total) by mouth 2 (two) times daily. What changed: when to take this   clonazePAM 1 MG tablet Commonly known as: KLONOPIN Take 1 tablet (1 mg total) by mouth at bedtime.   DULoxetine 60 MG capsule Commonly known as: CYMBALTA Take 60 mg by mouth daily.   ferrous sulfate 325 (65 FE) MG tablet Take 325 mg by mouth 2 (two) times daily with a meal.   furosemide 40 MG tablet Commonly known as: LASIX Take 0.5 tablets (20 mg total) by mouth 2 (two) times daily.   gabapentin 300 MG capsule Commonly known as: NEURONTIN Take 300 mg by mouth 2 (two) times daily.   insulin glargine 100 unit/mL Sopn Commonly known as: LANTUS Inject 0.2 mLs (20 Units  total) into the skin daily. What changed: how much to take   magnesium oxide 400 MG tablet Commonly known as: MAG-OX Take 400 mg by mouth daily.   metFORMIN 500 MG tablet Commonly known as: GLUCOPHAGE Take 500 mg by mouth 2 (two) times daily with a meal.   multivitamin with minerals Tabs tablet Take 1  tablet by mouth daily.   NovoLOG FlexPen 100 UNIT/ML FlexPen Generic drug: insulin aspart Inject 0-6 Units into the skin 3 (three) times daily with meals. insulin aspart (novoLOG) injection 0-8 Units 0-8 Units,  Subcutaneous, 3 times daily with meals, CBG < 70: Implement Hypoglycemia Standing Orders and refer to Hypoglycemia Standing Orders sidebar report  CBG 70 - 120: 0 units  CBG 121 - 150: 0 unit  CBG 151 - 200: 2 units  CBG 201 - 250: 3 units  CBG 251 - 300: 4 units  CBG 301 - 350: 6 units  CBG 351 - 400: 8 units CBG > 400:   potassium chloride 10 MEQ tablet Commonly known as: KLOR-CON Take 1 tablet (10 mEq total) by mouth daily. What changed: when to take this   predniSONE 20 MG tablet Commonly known as: Deltasone Take 1 tablet (20 mg total) by mouth See admin instructions. Take 40 mg (2 Tab) daily with breakfast for 5 days then 20 mg (1 tab) daily with breakfast for another 5 days then stop   QUEtiapine 100 MG tablet Commonly known as: SEROQUEL Take 100 mg by mouth at bedtime.   vitamin C 500 MG tablet Commonly known as: ASCORBIC ACID Take 500 mg by mouth daily.   Vitamin D3 75 MCG (3000 UT) Tabs Take 1 capsule by mouth daily.   warfarin 5 MG tablet Commonly known as: COUMADIN Take 2.5-5 mg by mouth as directed. Take 2.5mg  one day, then alternates by taking  the next day   zinc sulfate 220 (50 Zn) MG capsule Take 220 mg by mouth daily.       Major procedures and Radiology Reports - PLEASE review detailed and final reports for all details, in brief -   Ct Chest Wo Contrast  Result Date: 01/01/2019 CLINICAL DATA:  Shortness of breath.  Recent COVID  infection. EXAM: CT CHEST WITHOUT CONTRAST TECHNIQUE: Multidetector CT imaging of the chest was performed following the standard protocol without IV contrast. COMPARISON:  CT chest dated September 04, 2018 FINDINGS: Cardiovascular: The heart is enlarged. Aortic calcifications are noted. Advanced coronary artery calcifications are noted. Annular mitral valve cast locations are again noted. There is no significant pericardial effusion. Mediastinum/Nodes: --there are enlarged mediastinal and hilar lymph nodes. These have slightly increased in size from prior study. For example there is a pretracheal lymph node measuring approximately 1.8 cm (previously measuring approximately 1.6 cm). --No axillary lymphadenopathy. --No supraclavicular lymphadenopathy. --there is a large left-sided thyroid nodule measuring approximately 4.5 cm. --The esophagus is unremarkable Lungs/Pleura: There are scattered bilateral pulmonary airspace opacities, many of which are ground-glass in appearance. There are areas of scarring and volume loss for example in the right middle lobe and bilateral lower lobes. There is a persistent mosaic appearance of the lung fields bilaterally. There is debris within the trachea. There is some mild bilateral interlobular septal thickening. There is consolidation at the lung bases bilaterally. There are moderate-sized bilateral pleural effusions. There is no pneumothorax. Upper Abdomen: No acute abnormality. There is a moderate-sized hiatal hernia. Musculoskeletal: Again noted is a compression fracture of the L1 vertebral body. This appears relatively stable from July 2020. Multilevel degenerative changes are noted throughout the visualized thoracolumbar spine. There are advanced degenerative changes of both glenohumeral joints. IMPRESSION: 1. Scattered bilateral airspace opacities with with larger areas of consolidation involving the bilateral lower lobes. Findings are concerning for an atypical infectious process  in the appropriate clinical setting. These findings have progressed since July of 2020.  A component of underlying interstitial lung disease is difficult to entirely exclude on this exam. 2. Moderate-sized bilateral pleural effusions. 3. 4.5 cm dominant left-sided thyroid nodule. This meets criteria for follow-up with a nonemergent outpatient thyroid ultrasound. 4. Cardiomegaly with pulmonary edema. 5. Moderate-sized hiatal hernia. 6. Enlarged mediastinal lymph nodes, presumably reactive in etiology. 7. Persistent mosaic appearance of the lung fields bilaterally may be secondary to small airway disease or chronic occlusive vascular disease. Aortic Atherosclerosis (ICD10-I70.0). Electronically Signed   By: Constance Holster M.D.   On: 01/01/2019 15:29   Dg Chest Portable 1 View  Result Date: 01/01/2019 CLINICAL DATA:  Shortness of breath, COVID-19 positive 5 weeks prior EXAM: PORTABLE CHEST 1 VIEW COMPARISON:  Radiograph 12/08/2018, CT 09/04/2018 FINDINGS: Multifocal areas of airspace opacity throughout the lungs. Cardiomegaly, increased from comparison. Dense mitral annular calcification is seen. Pacer pack overlies the left chest wall with leads at the apex and right atrium. No pneumothorax. Probable left effusion. No acute osseous or soft tissue abnormality. IMPRESSION: 1. Multifocal areas of airspace opacity throughout the lungs, suspicious for multifocal pneumonia. 2. Cardiomegaly, increased from comparison. Electronically Signed   By: Lovena Le M.D.   On: 01/01/2019 06:10   Dg Chest Port 1 View  Result Date: 12/08/2018 CLINICAL DATA:  Shortness of breath for 2 days. EXAM: PORTABLE CHEST 1 VIEW COMPARISON:  Chest radiograph 09/03/2018 and CT 09/04/2018 FINDINGS: A dual lead pacemaker remains in place. The cardiac silhouette remains enlarged. Hazy airspace opacity is again seen in both lungs, slightly worse than on the prior study. The interstitial markings are mildly increased diffusely. No sizable  pleural effusion or pneumothorax is identified. No acute osseous abnormality is seen. Aortic atherosclerosis is noted. IMPRESSION: Cardiomegaly with bilateral airspace opacities which are mildly increased compared to 09/03/2018 and may reflect edema or infection. Electronically Signed   By: Logan Bores M.D.   On: 12/08/2018 20:56    Micro Results   Recent Results (from the past 240 hour(s))  Blood culture (routine x 2)     Status: None   Collection Time: 01/01/19  5:55 AM   Specimen: BLOOD  Result Value Ref Range Status   Specimen Description BLOOD RIGHT ARM  Final   Special Requests   Final    BOTTLES DRAWN AEROBIC AND ANAEROBIC Blood Culture adequate volume   Culture   Final    NO GROWTH 5 DAYS Performed at [facility], [address] [place], [place] [address]    Report Status 01/06/2019 FINAL  Final  SARS Coronavirus 2 by RT PCR (hospital order, performed in [facility] hospital lab) Nasopharyngeal Nasopharyngeal Swab     Status: Abnormal   Collection Time: 01/01/19  7:37 AM   Specimen: Nasopharyngeal Swab  Result Value Ref Range Status   SARS Coronavirus 2 POSITIVE (A) NEGATIVE Final    Comment: RESULT CALLED TO, READ BACK BY AND VERIFIED WITH: WHITE M. @ 0905 ON [id number] BY HENDERSON L. (NOTE) SARS-CoV-2 target nucleic acids are DETECTED SARS-CoV-2 RNA is generally detectable in upper respiratory specimens  during the acute phase of infection.  Positive results are indicative  of the presence of the identified virus, but do not rule out bacterial infection or co-infection with other pathogens not detected by the test.  Clinical correlation with patient history and  other diagnostic information is necessary to determine patient infection status.  The expected result is negative. Fact Sheet for Patients:   StrictlyIdeas.no  Fact Sheet for Healthcare Providers:   BankingDealers.co.za  This test is not yet approved or cleared by  the [place] and  has been authorized for detection and/or diagnosis of SARS-CoV-2 by FDA under an Emergency Use Authorization (EUA).  This EUA will remain in effect (meaning this t est can be used) for the duration of  the COVID-19 declaration under Section 564(b)(1) of the Act, 21 U.S.C. section 360-bbb-3(b)(1), unless the authorization is terminated or revoked sooner. Performed at [facility], [address], [place], [place] [address]   Blood culture (routine x 2)     Status: None   Collection Time: 01/01/19  7:42 AM   Specimen: BLOOD RIGHT FOREARM  Result Value Ref Range Status   Specimen Description   Final    BLOOD RIGHT FOREARM BOTTLES DRAWN AEROBIC AND ANAEROBIC   Special Requests   Final    Blood Culture results may not be optimal due to an inadequate volume of blood received in culture bottles   Culture   Final    NO GROWTH 5 DAYS Performed at [facility], [address] [place], [place] [address]    Report Status 01/06/2019 FINAL  Final  MRSA PCR Screening     Status: None   Collection Time: 01/01/19  5:20 PM   Specimen: Nasal Mucosa; Nasopharyngeal  Result Value Ref Range Status   MRSA by PCR NEGATIVE NEGATIVE Final    Comment:        The GeneXpert MRSA Assay (FDA approved for NASAL specimens only), is one component of a comprehensive MRSA colonization surveillance program. It is not intended to diagnose MRSA infection nor to guide or monitor treatment for MRSA infections. Performed at [facility], [address] [place], [place] [address]        Today   Subjective    Shanaia Sievers today has no new concerns -Shortness of breath is improved, oxygen requirement is back to baseline of 2 L/min -No productive cough, afebrile          Patient has been seen and examined prior to discharge   Objective   Blood pressure (!) 124/57, pulse 61, temperature 98.1 F (36.7 C), resp. rate 17, height  (1.727 m), weight 107 kg, SpO2 96  %.   Intake/Output Summary (Last 24 hours) at 01/07/2019 1300 Last data filed at 01/07/2019 1100 Gross per 24 hour  Intake 480 ml  Output 1500 ml  Net -1020 ml    Exam Gen:- Awake Alert, no acute distress , no conversational dyspnea HEENT:- [place].AT, No sclera icterus Nose- 2L/min Neck-Supple Neck,No JVD,.  Lungs-improved air movement, no wheezing  CV- S1, S2 normal, regular Abd-  +ve B.Sounds, Abd Soft, No tenderness, increased truncal adiposity Extremity/Skin:-Trace edema,   good pulses Psych-affect is appropriate, oriented x3 Neuro-generalized weakness, no new focal deficits, no tremors    Data Review   CBC w Diff:  Lab Results  Component Value Date   WBC 8.8 01/01/2019   HGB 11.5 (L) 01/07/2019   HCT 37.5 01/07/2019   PLT 292 01/01/2019   LYMPHOPCT 12 01/01/2019   MONOPCT 6 01/01/2019   EOSPCT 3 01/01/2019   BASOPCT 1 01/01/2019    CMP:  Lab Results  Component Value Date   NA 140 01/07/2019   NA 137 11/08/2014   K 3.9 01/07/2019   CL 95 (L) 01/07/2019   CO2 32 01/07/2019   BUN 35 (H) 01/07/2019   BUN 29 (H) 11/08/2014   CREATININE 0.96 01/07/2019   CREATININE 0.95 07/29/2012   PROT 7.5  01/01/2019   PROT 7.4 11/08/2014   ALBUMIN 3.0 (L) 01/07/2019   ALBUMIN 4.4 11/08/2014   BILITOT 0.7 01/01/2019   BILITOT 0.2 11/08/2014   ALKPHOS 58 01/01/2019   AST 24 01/01/2019   ALT 26 01/01/2019  .   Total Discharge time is about 33 minutes  Shon Hale M.D on 01/07/2019 at 1:00 PM  Go to [email or web address] -  for contact info  [facility] - Office  [phone]

## 2019-01-19 ENCOUNTER — Ambulatory Visit (INDEPENDENT_AMBULATORY_CARE_PROVIDER_SITE_OTHER): Payer: Medicare Other | Admitting: *Deleted

## 2019-01-19 DIAGNOSIS — Z950 Presence of cardiac pacemaker: Secondary | ICD-10-CM

## 2019-01-20 ENCOUNTER — Encounter: Payer: Self-pay | Admitting: Student

## 2019-01-20 ENCOUNTER — Telehealth (INDEPENDENT_AMBULATORY_CARE_PROVIDER_SITE_OTHER): Payer: Medicare Other | Admitting: Student

## 2019-01-20 VITALS — BP 130/80 | Ht 66.0 in | Wt 230.0 lb

## 2019-01-20 DIAGNOSIS — I4819 Other persistent atrial fibrillation: Secondary | ICD-10-CM

## 2019-01-20 DIAGNOSIS — I10 Essential (primary) hypertension: Secondary | ICD-10-CM

## 2019-01-20 DIAGNOSIS — I442 Atrioventricular block, complete: Secondary | ICD-10-CM

## 2019-01-20 DIAGNOSIS — I5042 Chronic combined systolic (congestive) and diastolic (congestive) heart failure: Secondary | ICD-10-CM

## 2019-01-20 DIAGNOSIS — I050 Rheumatic mitral stenosis: Secondary | ICD-10-CM

## 2019-01-20 LAB — CUP PACEART REMOTE DEVICE CHECK
Battery Remaining Longevity: 108 mo
Battery Voltage: 2.99 V
Brady Statistic AP VP Percent: 0 %
Brady Statistic AP VS Percent: 0 %
Brady Statistic AS VP Percent: 0 %
Brady Statistic AS VS Percent: 0 %
Brady Statistic RA Percent Paced: 0.38 %
Brady Statistic RV Percent Paced: 98.7 %
Date Time Interrogation Session: 20201214232819
Implantable Lead Implant Date: 20180921
Implantable Lead Implant Date: 20180921
Implantable Lead Location: 753859
Implantable Lead Location: 753860
Implantable Lead Model: 5076
Implantable Lead Model: 5076
Implantable Pulse Generator Implant Date: 20180921
Lead Channel Impedance Value: 285 Ohm
Lead Channel Impedance Value: 304 Ohm
Lead Channel Impedance Value: 418 Ohm
Lead Channel Impedance Value: 418 Ohm
Lead Channel Pacing Threshold Amplitude: 0.5 V
Lead Channel Pacing Threshold Amplitude: 0.875 V
Lead Channel Pacing Threshold Pulse Width: 0.4 ms
Lead Channel Pacing Threshold Pulse Width: 0.4 ms
Lead Channel Sensing Intrinsic Amplitude: 1 mV
Lead Channel Sensing Intrinsic Amplitude: 1 mV
Lead Channel Sensing Intrinsic Amplitude: 2.75 mV
Lead Channel Sensing Intrinsic Amplitude: 2.75 mV
Lead Channel Setting Pacing Amplitude: 1.5 V
Lead Channel Setting Pacing Amplitude: 2.5 V
Lead Channel Setting Pacing Pulse Width: 0.4 ms
Lead Channel Setting Sensing Sensitivity: 1.2 mV

## 2019-01-20 NOTE — Progress Notes (Signed)
Virtual Visit via Telephone Note   This visit type was conducted due to national recommendations for restrictions regarding the COVID-19 Pandemic (e.g. social distancing) in an effort to limit this patient's exposure and mitigate transmission in our community.  Due to her co-morbid illnesses, this patient is at least at moderate risk for complications without adequate follow up.  This format is felt to be most appropriate for this patient at this time.  The patient did not have access to video technology/had technical difficulties with video requiring transitioning to audio format only (telephone).  All issues noted in this document were discussed and addressed.  No physical exam could be performed with this format.  Please refer to the patient's chart for her  consent to telehealth for [facility].   Date:  01/20/2019   ID:  Rebecca [name], DOB [birth year], MRN [id number]  Patient Location: Home Provider Location: Office  PCP:  Rebecca Linsey, MD  Cardiologist:  Prentice Docker, MD  Electrophysiologist: Dr. Ladona Ridgel  Evaluation Performed:  Follow-Up Visit  Chief Complaint: Recurrent Hospitalizations  History of Present Illness:    Rebecca [name] is a 77 y.o. female with past medical history of chronic diastolic CHF, CHB (s/p Medtronic PPM placement in 2018), persistent atrial fibrillation, recurrent DVT, HTN, HLD, Type 2 DM, mild to moderate AS and moderate to severe MS who presents to the office today for overdue follow-up.  She was admitted to [facility] from 11/3 - 12/16/2018 for management of acute hypoxic respiratory failure in the setting of COVID-19 viral pneumonia. She was treated with IV steroids and Remdesivir during admission. Cardiology was consulted during admission as she had elevated troponin values which peaked at 5541. Echocardiogram was obtained which showed EF was slightly reduced at 45 to 50% with the entire apex, mid and apical inferior septum, mid anterior septal  segment and basal inferior lateral segment being abnormal. No further ischemic testing was pursued at that time as she was asymptomatic from a cardiac perspective.  She was again admitted from 11/27 - 01/07/2019 for evaluation of worsening recurrent dyspnea and associated weakness. It was felt that her symptoms were still consistent with recent Covid infection and she was continued on IV steroids. She was treated for a mild CHF exacerbation during admission and was transitioned to Lasix 20mg  BID at the time of discharge. She was continued on Coreg 3.125mg  BID and Coumadin for anticoagulation.  Weight was stable at 235 lbs upon hospital discharge.  In talking the patient today most history is provided by her granddaughter Rebecca [name]) who is her primary caregiver. She reports that the patient was significantly weak upon returning home but has been working with PT multiple times per week and is now walking to the restroom. She is on supplemental oxygen 24/7 and they report oxygen saturations have overall been well controlled in the 90's. She denies any recent chest pain or palpitations. No recent orthopnea, PND or lower extremity edema. She has been taking Lasix regularly and weight was at 230 lbs on most recent check. Not weighing daily due to balance issues and concerns for falling.   She has remained on Coumadin for anticoagulation and no reports of melena or hematochezia.    Past Medical History:  Diagnosis Date  . Aortic stenosis   . Cataplexy   . Diastolic heart failure (HCC)   . Essential hypertension   . GERD (gastroesophageal reflux disease)   . Hiatal hernia   . History of recurrent deep vein thrombosis (DVT)   .  Mitral stenosis   . Narcolepsy   . Neuropathy   . Renal disorder   . Type 2 diabetes mellitus (HCC)    Past Surgical History:  Procedure Laterality Date  . ABDOMINAL HYSTERECTOMY    . CARPAL TUNNEL RELEASE    . CARPECTOMY  12/26/2011   Procedure: CARPECTOMY;  Surgeon: Wyn Forsterobert  V Sypher Jr., MD;  Location: [facility];  Service: Orthopedics;  Laterality: Right;  PROXIMAL ROW CARPECTOMY RIGHT WRIST and neurectomy  . CHOLECYSTECTOMY    . HEMORROIDECTOMY    . KIDNEY SURGERY    . NEPHROSTOMY    . PACEMAKER IMPLANT N/A 10/25/2016   Procedure: Pacemaker Implant;  Surgeon: Regan Lemmingamnitz, Will Martin, MD;  [facility];  Service: Cardiovascular;  Laterality: N/A;  . TOTAL HIP ARTHROPLASTY     bilateral     Current Meds  Medication Sig  . acetaminophen (TYLENOL) 500 MG tablet Take 1,000 mg by mouth 2 (two) times daily as needed for mild pain.   Marland Kitchen. BYDUREON 2 MG PEN Inject 2 mg into the skin once a week.  . carvedilol (COREG) 3.125 MG tablet Take 1 tablet (3.125 mg total) by mouth 2 (two) times daily with a meal.  . Cholecalciferol (VITAMIN D3) 3000 UNITS TABS Take 1 capsule by mouth daily.  . clobetasol cream (TEMOVATE) 0.05 % Apply 1 application topically daily.   . clomiPRAMINE (ANAFRANIL) 25 MG capsule Take 1 capsule (25 mg total) by mouth 2 (two) times daily.  . clonazePAM (KLONOPIN) 1 MG tablet Take 1 tablet (1 mg total) by mouth at bedtime.  . DULoxetine (CYMBALTA) 60 MG capsule Take 60 mg by mouth daily.    . ferrous sulfate 325 (65 FE) MG tablet Take 325 mg by mouth 2 (two) times daily with a meal.   . furosemide (LASIX) 40 MG tablet Take 0.5 tablets (20 mg total) by mouth 2 (two) times daily.  Marland Kitchen. gabapentin (NEURONTIN) 300 MG capsule Take 300 mg by mouth 2 (two) times daily.  . insulin aspart (NOVOLOG FLEXPEN) 100 UNIT/ML FlexPen Inject 0-6 Units into the skin 3 (three) times daily with meals. insulin aspart (novoLOG) injection 0-8 Units 0-8 Units,  Subcutaneous, 3 times daily with meals, CBG < 70: Implement Hypoglycemia Standing Orders and refer to Hypoglycemia Standing Orders sidebar report  CBG 70 - 120: 0 units  CBG 121 - 150: 0 unit  CBG 151 - 200: 2 units  CBG 201 - 250: 3 units  CBG 251 - 300: 4 units  CBG 301 - 350: 6 units  CBG  351 - 400: 8 units CBG > 400:  . insulin glargine (LANTUS) 100 unit/mL SOPN Inject 0.2 mLs (20 Units total) into the skin daily.  . magnesium oxide (MAG-OX) 400 MG tablet Take 400 mg by mouth daily.  . metFORMIN (GLUCOPHAGE) 500 MG tablet Take 500 mg by mouth 2 (two) times daily with a meal.  . Multiple Vitamin (MULTIVITAMIN WITH MINERALS) TABS tablet Take 1 tablet by mouth daily.  . potassium chloride (KLOR-CON) 10 MEQ tablet Take 1 tablet (10 mEq total) by mouth daily.  . predniSONE (DELTASONE) 20 MG tablet Take 1 tablet (20 mg total) by mouth See admin instructions. Take 40 mg (2 Tab) daily with breakfast for 5 days then 20 mg (1 tab) daily with breakfast for another 5 days then stop  . QUEtiapine (SEROQUEL) 100 MG tablet Take 100 mg by mouth at bedtime.  . vitamin C (ASCORBIC ACID) 500 MG tablet Take  500 mg by mouth daily.  Marland Kitchen warfarin (COUMADIN) 5 MG tablet Take 2.5-5 mg by mouth as directed. Take 2.5mg  one day, then alternates by taking 5mg  the next day  . zinc sulfate 220 (50 Zn) MG capsule Take 220 mg by mouth daily.     Allergies:   Codeine, Compazine, Other, Penicillins, Morphine and related, and Demerol [meperidine]   Social History   Tobacco Use  . Smoking status: Former Smoker    Quit date: 10/13/1988    Years since quitting: 30.2  . Smokeless tobacco: Never Used  Substance Use Topics  . Alcohol use: No    Alcohol/week: 0.0 standard drinks  . Drug use: No     Family Hx: The patient's family history includes Diabetes in an other family member; Heart disease in her father; Scleroderma in her brother; Stroke in her mother.  ROS:   Please see the history of present illness.     All other systems reviewed and are negative.   Prior CV studies:   The following studies were reviewed today:  Limited Echo: 12/09/2018 IMPRESSIONS    1. Left ventricular ejection fraction, by visual estimation, is 45 to 50%. The left ventricle has mildly decreased function. There is mildly  increased left ventricular hypertrophy.  2. Entire apex, mid and apical inferior septum, mid anteroseptal segment, and basal inferolateral segment are abnormal.  3. Left ventricular diastolic parameters are indeterminate.  4. Global right ventricle has mildly reduced systolic function.The right ventricular size is normal. No increase in right ventricular wall thickness.  5. Left atrial size was severely dilated.  6. Right atrial size was moderately dilated.  7. Severe mitral annular calcification.  8. Severe thickening of the mitral valve leaflet(s).  9. The mitral valve is abnormal. Trace mitral valve regurgitation. Severe mitral stenosis. Mean gradient 10 mmHg. 10. The tricuspid valve is grossly normal. Tricuspid valve regurgitation is mild. 11. Aortic valve mean gradient measures 13.0 mmHg. 12. The aortic valve is tricuspid. Aortic valve regurgitation is mild. Leaflets are moderately calcified and cusp excursion is moderately reduced. Although not fully interrogated, suspect at least moderate aortic stenosis. 13. The pulmonic valve was grossly normal. Pulmonic valve regurgitation is trivial. 14. TR signal is inadequate for assessing pulmonary artery systolic pressure. 15. A pacer wire is visualized. 16. The inferior vena cava is normal in size with greater than 50% respiratory variability, suggesting right atrial pressure of 3 mmHg.  Labs/Other Tests and Data Reviewed:    EKG:  An ECG dated 01/01/2019 was personally reviewed today and demonstrated: Atrial fibrillation, HR 63 with LBBB.  Recent Labs: 09/04/2018: TSH 1.052 12/15/2018: Magnesium 1.8 01/01/2019: ALT 26; B Natriuretic Peptide 445.0; Platelets 292 01/07/2019: BUN 35; Creatinine, Ser 0.96; Hemoglobin 11.5; Potassium 3.9; Sodium 140   Recent Lipid Panel Lab Results  Component Value Date/Time   TRIG 75 12/08/2018 09:13 PM    Wt Readings from Last 3 Encounters:  01/20/19 230 lb (104.3 kg)  01/07/19 235 lb 14.3 oz (107 kg)    12/08/18 238 lb 12.1 oz (108.3 kg)     Objective:    Vital Signs:  BP 130/80   Ht 5\' 6"  (1.676 m)   Wt 230 lb (104.3 kg)   BMI 37.12 kg/m    No physical exam performed as this was a phone visit.   ASSESSMENT & PLAN:    1. Chronic Combined Systolic and Diastolic CHF - She had significant issues with lower extremity edema following her admission in 12/2018 but they  report her edema has improved since her most recent hospitalization. Weight has declined by 5 pounds since discharge and is currently at 230 lbs. Will continue Lasix at her current dosing of 20 mg twice daily. We did review that she could take an extra tablet if needed for worsening edema or weight gain greater than 2 pounds overnight or 5 pounds in 1 week. - continue Coreg 3.125 mg BID for her cardiomyopathy. Given variable BP during recent admissions, would be hesitant to add ACE-I or ARB at this time. Would consider the addition of Losartan 12.5 mg daily at follow-up if BP has overall remained stable.  2. Valvular Heart Disease - Limited echocardiogram in 12/2018 showed moderate aortic stenosis and severe mitral stenosis. By review of prior notes from her Cardiologist, it is felt that she would likely not be a candidate for percutaneous mitral balloon valvuloplasty given her severe mitral annular calcification. In talking with her granddaughter today, she says the patient does not wish to undergo a full valve replacement surgery if that was indicated and would prefer more conservative management. She would be open to less invasive options. I recommended that she continue to increase her strength and work with PT following her recent admission for COVID-19.  Would reassess at the time of follow-up. If she prefers a more conservative approach, would not anticipate further testing. If she has significantly improved and is interested in surgical options, would consider [facility] but she is very clear in her intent when talking to her family  she would "never want her chest cracked open".   3. Persistent Atrial Fibrillation - By review of her most recent device interrogation, she has been in persistent atrial fibrillation since 12/11/2018. Suspect her arrhythmia has been triggered by her significant illness in the setting of COVID-19. Rates have overall been well controlled in the 70's to 80's when checked at home. Would continue with Coreg 3.125 mg twice daily for rate control. - She denies any evidence of active bleeding. Remains on Coumadin for anticoagulation. This is followed by her PCP.  4. CHB - s/p Medtronic PPM placement in 2018 which is followed by Dr. Ladona Ridgel. Most recent interrogation obtained earlier today showed normal device function but she had been in persistent atrial fibrillation as outlined above.  5. HTN - BP was well controlled at 130/80 when checked today. She has been having intermittent episodes of hypotension and pending overall BP trend, could consider Losartan as outlined above due to her cardiomyopathy.    COVID-19 Education: The signs and symptoms of COVID-19 were discussed with the patient and how to seek care for testing (follow up with PCP or arrange E-visit).  The importance of social distancing was discussed today.  Time:   Today, I have spent 26 minutes with the patient with telehealth technology discussing the above problems.     Medication Adjustments/Labs and Tests Ordered: Current medicines are reviewed at length with the patient today.  Concerns regarding medicines are outlined above.   Tests Ordered: No orders of the defined types were placed in this encounter.   Medication Changes: No orders of the defined types were placed in this encounter.   Follow Up:  In person in 2-3 months  Signed, Ellsworth Lennox, PA-C  01/20/2019 5:57 PM    [facility]

## 2019-01-20 NOTE — Patient Instructions (Signed)
Medication Instructions:  Your physician recommends that you continue on your current medications as directed. Please refer to the Current Medication list given to you today.  *If you need a refill on your cardiac medications before your next appointment, please call your pharmacy*  Lab Work: NONE  If you have labs (blood work) drawn today and your tests are completely normal, you will receive your results only by: Marland Kitchen MyChart Message (if you have MyChart) OR . A paper copy in the mail If you have any lab test that is abnormal or we need to change your treatment, we will call you to review the results.  Testing/Procedures: NONE   Follow-Up: At [facility], you and your health needs are our priority.  As part of our continuing mission to provide you with exceptional heart care, we have created designated Provider Care Teams.  These Care Teams include your primary Cardiologist (physician) and Advanced Practice Providers (APPs -  Physician Assistants and Nurse Practitioners) who all work together to provide you with the care you need, when you need it.  Your next appointment:   2-3 month(s)  The format for your next appointment:   In Person  Provider:   Kate Sable, MD  Other Instructions Thank you for choosing [facility]!

## 2019-03-23 ENCOUNTER — Ambulatory Visit (INDEPENDENT_AMBULATORY_CARE_PROVIDER_SITE_OTHER): Payer: Medicare Other | Admitting: Cardiovascular Disease

## 2019-03-23 ENCOUNTER — Encounter: Payer: Self-pay | Admitting: Cardiovascular Disease

## 2019-03-23 VITALS — Ht 68.0 in | Wt 200.0 lb

## 2019-03-23 DIAGNOSIS — I050 Rheumatic mitral stenosis: Secondary | ICD-10-CM

## 2019-03-23 DIAGNOSIS — I110 Hypertensive heart disease with heart failure: Secondary | ICD-10-CM

## 2019-03-23 DIAGNOSIS — I5042 Chronic combined systolic (congestive) and diastolic (congestive) heart failure: Secondary | ICD-10-CM

## 2019-03-23 DIAGNOSIS — I4819 Other persistent atrial fibrillation: Secondary | ICD-10-CM | POA: Diagnosis not present

## 2019-03-23 DIAGNOSIS — I38 Endocarditis, valve unspecified: Secondary | ICD-10-CM

## 2019-03-23 DIAGNOSIS — Z950 Presence of cardiac pacemaker: Secondary | ICD-10-CM

## 2019-03-23 DIAGNOSIS — I442 Atrioventricular block, complete: Secondary | ICD-10-CM

## 2019-03-23 DIAGNOSIS — I350 Nonrheumatic aortic (valve) stenosis: Secondary | ICD-10-CM

## 2019-03-23 DIAGNOSIS — I10 Essential (primary) hypertension: Secondary | ICD-10-CM

## 2019-03-23 DIAGNOSIS — Z7901 Long term (current) use of anticoagulants: Secondary | ICD-10-CM

## 2019-03-23 NOTE — Progress Notes (Signed)
Virtual Visit via Telephone Note   This visit type was conducted due to national recommendations for restrictions regarding the COVID-19 Pandemic (e.g. social distancing) in an effort to limit this patient's exposure and mitigate transmission in our community.  Due to her co-morbid illnesses, this patient is at least at moderate risk for complications without adequate follow up.  This format is felt to be most appropriate for this patient at this time.  The patient did not have access to video technology/had technical difficulties with video requiring transitioning to audio format only (telephone).  All issues noted in this document were discussed and addressed.  No physical exam could be performed with this format.  Please refer to the patient's chart for her  consent to telehealth for [facility].   Date:  03/23/2019   ID:  Rebecca [name], DOB Jun 05, 1940, MRN [id number]  Patient Location: Home Provider Location: Office  PCP:  [name], MD  Cardiologist:  Prentice Docker, MD  Electrophysiologist:  None   Evaluation Performed:  Follow-Up Visit  Chief Complaint: CHF, atrial fibrillation, valvular heart disease  History of Present Illness:    Rebecca [name] is a 79 y.o. female with chronic combined heart failure, valvular heart disease, complete heart block with pacemaker, and persistent atrial fibrillation.  I spoke with her granddaughter who is also her caretaker, [name].  She fell Friday and didn't notify anyone. She didn't use her home alert.  She has knee pain and back pain.  She was hospitalized with COVID19 pneumonia in November 2020.  She plans to her PCP the week after next.  [name] thinks she's a bit "groggy".  She's on antibiotics for a respiratory infection.  She's had some left knee swelling since the fall.  She denies chest pain.     Past Medical History:  Diagnosis Date  . Aortic stenosis   . Cataplexy   . Diastolic heart failure (HCC)     . Essential hypertension   . GERD (gastroesophageal reflux disease)   . Hiatal hernia   . History of recurrent deep vein thrombosis (DVT)   . Mitral stenosis   . Narcolepsy   . Neuropathy   . Renal disorder   . Type 2 diabetes mellitus (HCC)    Past Surgical History:  Procedure Laterality Date  . ABDOMINAL HYSTERECTOMY    . CARPAL TUNNEL RELEASE    . CARPECTOMY  12/26/2011   Procedure: CARPECTOMY;  Surgeon: Wyn Forster., MD;  Location: [facility];  Service: Orthopedics;  Laterality: Right;  PROXIMAL ROW CARPECTOMY RIGHT WRIST and neurectomy  . CHOLECYSTECTOMY    . HEMORROIDECTOMY    . KIDNEY SURGERY    . NEPHROSTOMY    . PACEMAKER IMPLANT N/A 10/25/2016   Procedure: Pacemaker Implant;  Surgeon: Regan Lemming, MD;  Location: [facility] INVASIVE CV LAB;  Service: Cardiovascular;  Laterality: N/A;  . TOTAL HIP ARTHROPLASTY     bilateral     Current Meds  Medication Sig  . acetaminophen (TYLENOL) 500 MG tablet Take 1,000 mg by mouth 2 (two) times daily as needed for mild pain.   . carvedilol (COREG) 3.125 MG tablet Take 1 tablet (3.125 mg total) by mouth 2 (two) times daily with a meal.  . Cholecalciferol (VITAMIN D3) 3000 UNITS TABS Take 1 capsule by mouth daily.  . clobetasol cream (TEMOVATE) 0.05 % Apply 1 application topically daily.   . clomiPRAMINE (ANAFRANIL) 25 MG capsule Take 1 capsule (25 mg total) by mouth  2 (two) times daily.  . clonazePAM (KLONOPIN) 1 MG tablet Take 1 tablet (1 mg total) by mouth at bedtime.  . DULoxetine (CYMBALTA) 60 MG capsule Take 60 mg by mouth daily.    . ferrous sulfate 325 (65 FE) MG tablet Take 325 mg by mouth 2 (two) times daily with a meal.   . furosemide (LASIX) 40 MG tablet Take 0.5 tablets (20 mg total) by mouth 2 (two) times daily.  Marland Kitchen gabapentin (NEURONTIN) 300 MG capsule Take 300 mg by mouth 2 (two) times daily.  . insulin aspart (NOVOLOG FLEXPEN) 100 UNIT/ML FlexPen Inject 0-6 Units into the skin 3 (three) times  daily with meals. insulin aspart (novoLOG) injection 0-8 Units 0-8 Units,  Subcutaneous, 3 times daily with meals, CBG < 70: Implement Hypoglycemia Standing Orders and refer to Hypoglycemia Standing Orders sidebar report  CBG 70 - 120: 0 units  CBG 121 - 150: 0 unit  CBG 151 - 200: 2 units  CBG 201 - 250: 3 units  CBG 251 - 300: 4 units  CBG 301 - 350: 6 units  CBG 351 - 400: 8 units CBG > 400:  . insulin glargine (LANTUS) 100 unit/mL SOPN Inject 0.2 mLs (20 Units total) into the skin daily.  . magnesium oxide (MAG-OX) 400 MG tablet Take 400 mg by mouth daily.  . metFORMIN (GLUCOPHAGE) 500 MG tablet Take 500 mg by mouth 2 (two) times daily with a meal.  . Multiple Vitamin (MULTIVITAMIN WITH MINERALS) TABS tablet Take 1 tablet by mouth daily.  . potassium chloride (KLOR-CON) 10 MEQ tablet Take 1 tablet (10 mEq total) by mouth daily.  . predniSONE (DELTASONE) 20 MG tablet Take 1 tablet (20 mg total) by mouth See admin instructions. Take 40 mg (2 Tab) daily with breakfast for 5 days then 20 mg (1 tab) daily with breakfast for another 5 days then stop  . QUEtiapine (SEROQUEL) 100 MG tablet Take 100 mg by mouth at bedtime.  . vitamin C (ASCORBIC ACID) 500 MG tablet Take 500 mg by mouth daily.  Marland Kitchen warfarin (COUMADIN) 5 MG tablet Take 2.5-5 mg by mouth as directed. Take 2.5mg  one day, then alternates by taking 5mg  the next day  . zinc sulfate 220 (50 Zn) MG capsule Take 220 mg by mouth daily.     Allergies:   Codeine, Compazine, Other, Penicillins, Morphine and related, and Demerol [meperidine]   Social History   Tobacco Use  . Smoking status: Former Smoker    Quit date: 10/13/1988    Years since quitting: 30.4  . Smokeless tobacco: Never Used  Substance Use Topics  . Alcohol use: No    Alcohol/week: 0.0 standard drinks  . Drug use: No     Family Hx: The patient's family history includes Diabetes in an other family member; Heart disease in her father; Scleroderma in her brother; Stroke in  her mother.  ROS:   Please see the history of present illness.     All other systems reviewed and are negative.   Prior CV studies:   The following studies were reviewed today:  Limited Echo: 12/09/2018 IMPRESSIONS   1. Left ventricular ejection fraction, by visual estimation, is 45 to 50%. The left ventricle has mildly decreased function. There is mildly increased left ventricular hypertrophy. 2. Entire apex, mid and apical inferior septum, mid anteroseptal segment, and basal inferolateral segment are abnormal. 3. Left ventricular diastolic parameters are indeterminate. 4. Global right ventricle has mildly reduced systolic function.The right ventricular size is  normal. No increase in right ventricular wall thickness. 5. Left atrial size was severely dilated. 6. Right atrial size was moderately dilated. 7. Severe mitral annular calcification. 8. Severe thickening of the mitral valve leaflet(s). 9. The mitral valve is abnormal. Trace mitral valve regurgitation. Severe mitral stenosis. Mean gradient 10 mmHg. 10. The tricuspid valve is grossly normal. Tricuspid valve regurgitation is mild. 11. Aortic valve mean gradient measures 13.0 mmHg. 12. The aortic valve is tricuspid. Aortic valve regurgitation is mild. Leaflets are moderately calcified and cusp excursion is moderately reduced. Although not fully interrogated, suspect at least moderate aortic stenosis. 13. The pulmonic valve was grossly normal. Pulmonic valve regurgitation is trivial. 14. TR signal is inadequate for assessing pulmonary artery systolic pressure. 15. A pacer wire is visualized. 16. The inferior vena cava is normal in size with greater than 50% respiratory variability, suggesting right atrial pressure of 3 mmHg.  Labs/Other Tests and Data Reviewed:    EKG:  No ECG reviewed.  Recent Labs: 09/04/2018: TSH 1.052 12/15/2018: Magnesium 1.8 01/01/2019: ALT 26; B Natriuretic Peptide 445.0; Platelets  292 01/07/2019: BUN 35; Creatinine, Ser 0.96; Hemoglobin 11.5; Potassium 3.9; Sodium 140   Recent Lipid Panel Lab Results  Component Value Date/Time   TRIG 75 12/08/2018 09:13 PM    Wt Readings from Last 3 Encounters:  03/23/19 200 lb (90.7 kg)  01/20/19 230 lb (104.3 kg)  01/07/19 235 lb 14.3 oz (107 kg)     Objective:    Vital Signs:  Ht 5\' 8"  (1.727 m)   Wt 200 lb (90.7 kg)   BMI 30.41 kg/m    VITAL SIGNS:  reviewed  ASSESSMENT & PLAN:    1.  Chronic combined heart failure: Continue Lasix 20 mg twice daily along with carvedilol.  Weight is down 30 pounds since 03/23/2019.  I will defer adding an ACE inhibitor or angiotensin receptor blocker at this time but would consider a low-dose once I am confident her blood pressure is normal and has remained stable.  2.  Valvular heart disease: Echocardiogram in November 2020 showed moderate aortic stenosis and severe mitral stenosis.  She is not a candidate for percutaneous mitral balloon valvuloplasty given severe mitral annular calcification.  She does not wish to undergo a full valve replacement surgery and prefers conservative management.  She is open to less invasive options.  3.  Persistent atrial fibrillation: Continue carvedilol for heart rate control.  She is on warfarin for anticoagulation.  This is monitored by her PCP.  4.  Complete heart block: Status post Medtronic pacemaker placement in 2018 followed by Dr. 2019.  Normal device function.  5.  Hypertension: No change to therapy.     COVID-19 Education: The signs and symptoms of COVID-19 were discussed with the patient and how to seek care for testing (follow up with PCP or arrange E-visit).  The importance of social distancing was discussed today.  Time:   Today, I have spent 15 minutes with the patient with telehealth technology discussing the above problems.     Medication Adjustments/Labs and Tests Ordered: Current medicines are reviewed at length with the  patient today.  Concerns regarding medicines are outlined above.   Tests Ordered: No orders of the defined types were placed in this encounter.   Medication Changes: No orders of the defined types were placed in this encounter.   Follow Up:  In Person in 3 month(s) with APP   Ladona Ridgel wishes to be contacted regarding appointment scheduling given her grandmother's dementia)  Signed, Kate Sable, MD  03/23/2019 3:01 PM    [place]

## 2019-03-23 NOTE — Patient Instructions (Signed)
Medication Instructions:  Your physician recommends that you continue on your current medications as directed. Please refer to the Current Medication list given to you today.  *If you need a refill on your cardiac medications before your next appointment, please call your pharmacy*  Lab Work: None today If you have labs (blood work) drawn today and your tests are completely normal, you will receive your results only by: . MyChart Message (if you have MyChart) OR . A paper copy in the mail If you have any lab test that is abnormal or we need to change your treatment, we will call you to review the results.  Testing/Procedures: None today  Follow-Up: At [facility], you and your health needs are our priority.  As part of our continuing mission to provide you with exceptional heart care, we have created designated Provider Care Teams.  These Care Teams include your primary Cardiologist (physician) and Advanced Practice Providers (APPs -  Physician Assistants and Nurse Practitioners) who all work together to provide you with the care you need, when you need it.  Your next appointment:   3 month(s)  The format for your next appointment:   In Person  Provider:   Brittany Strader, PA-C  Other Instructions None     Thank you for choosing [facility] !         

## 2019-03-24 ENCOUNTER — Other Ambulatory Visit: Payer: Self-pay

## 2019-03-24 ENCOUNTER — Encounter (HOSPITAL_COMMUNITY): Payer: Self-pay | Admitting: Emergency Medicine

## 2019-03-24 ENCOUNTER — Emergency Department (HOSPITAL_COMMUNITY): Payer: Medicare Other

## 2019-03-24 ENCOUNTER — Emergency Department (HOSPITAL_COMMUNITY)
Admission: EM | Admit: 2019-03-24 | Discharge: 2019-03-24 | Disposition: A | Discharge status: Home / Self Care | Payer: Medicare Other | Attending: Emergency Medicine | Admitting: Emergency Medicine

## 2019-03-24 DIAGNOSIS — Y999 Unspecified external cause status: Secondary | ICD-10-CM | POA: Diagnosis not present

## 2019-03-24 DIAGNOSIS — Y929 Unspecified place or not applicable: Secondary | ICD-10-CM | POA: Insufficient documentation

## 2019-03-24 DIAGNOSIS — Z950 Presence of cardiac pacemaker: Secondary | ICD-10-CM | POA: Diagnosis not present

## 2019-03-24 DIAGNOSIS — Y939 Activity, unspecified: Secondary | ICD-10-CM | POA: Insufficient documentation

## 2019-03-24 DIAGNOSIS — R0789 Other chest pain: Secondary | ICD-10-CM | POA: Insufficient documentation

## 2019-03-24 DIAGNOSIS — F0390 Unspecified dementia without behavioral disturbance: Secondary | ICD-10-CM | POA: Insufficient documentation

## 2019-03-24 DIAGNOSIS — Z96643 Presence of artificial hip joint, bilateral: Secondary | ICD-10-CM | POA: Diagnosis not present

## 2019-03-24 DIAGNOSIS — W19XXXA Unspecified fall, initial encounter: Secondary | ICD-10-CM | POA: Insufficient documentation

## 2019-03-24 DIAGNOSIS — M25562 Pain in left knee: Secondary | ICD-10-CM | POA: Diagnosis not present

## 2019-03-24 DIAGNOSIS — Z87891 Personal history of nicotine dependence: Secondary | ICD-10-CM | POA: Diagnosis not present

## 2019-03-24 DIAGNOSIS — R42 Dizziness and giddiness: Secondary | ICD-10-CM | POA: Diagnosis not present

## 2019-03-24 DIAGNOSIS — Z20822 Contact with and (suspected) exposure to covid-19: Secondary | ICD-10-CM | POA: Insufficient documentation

## 2019-03-24 DIAGNOSIS — N183 Chronic kidney disease, stage 3 unspecified: Secondary | ICD-10-CM | POA: Insufficient documentation

## 2019-03-24 DIAGNOSIS — I130 Hypertensive heart and chronic kidney disease with heart failure and stage 1 through stage 4 chronic kidney disease, or unspecified chronic kidney disease: Secondary | ICD-10-CM | POA: Diagnosis not present

## 2019-03-24 DIAGNOSIS — R519 Headache, unspecified: Secondary | ICD-10-CM | POA: Diagnosis not present

## 2019-03-24 DIAGNOSIS — R109 Unspecified abdominal pain: Secondary | ICD-10-CM | POA: Diagnosis not present

## 2019-03-24 DIAGNOSIS — I5032 Chronic diastolic (congestive) heart failure: Secondary | ICD-10-CM | POA: Insufficient documentation

## 2019-03-24 DIAGNOSIS — Z79899 Other long term (current) drug therapy: Secondary | ICD-10-CM | POA: Diagnosis not present

## 2019-03-24 LAB — CBC
HCT: 39.9 % (ref 36.0–46.0)
Hemoglobin: 12.1 g/dL (ref 12.0–15.0)
MCH: 29 pg (ref 26.0–34.0)
MCHC: 30.3 g/dL (ref 30.0–36.0)
MCV: 95.7 fL (ref 80.0–100.0)
Platelets: 250 10*3/uL (ref 150–400)
RBC: 4.17 MIL/uL (ref 3.87–5.11)
RDW: 14.7 % (ref 11.5–15.5)
WBC: 7.2 10*3/uL (ref 4.0–10.5)
nRBC: 0 % (ref 0.0–0.2)

## 2019-03-24 LAB — ETHANOL: Alcohol, Ethyl (B): <10 mg/dL (ref <10)

## 2019-03-24 LAB — COMPREHENSIVE METABOLIC PANEL
ALT: 17 U/L (ref 0–44)
AST: 22 U/L (ref 15–41)
Albumin: 3.6 g/dL (ref 3.5–5.0)
Alkaline Phosphatase: 50 U/L (ref 38–126)
Anion gap: 11 (ref 5–15)
BUN: 30 mg/dL — ABNORMAL HIGH (ref 8–23)
CO2: 30 mmol/L (ref 22–32)
Calcium: 9.4 mg/dL (ref 8.9–10.3)
Chloride: 97 mmol/L — ABNORMAL LOW (ref 98–111)
Creatinine, Ser: 1.03 mg/dL — ABNORMAL HIGH (ref 0.44–1.00)
GFR calc Af Amer: 60 mL/min — ABNORMAL LOW (ref >60)
GFR calc non Af Amer: 52 mL/min — ABNORMAL LOW (ref >60)
Glucose, Bld: 220 mg/dL — ABNORMAL HIGH (ref 70–99)
Potassium: 4.3 mmol/L (ref 3.5–5.1)
Sodium: 138 mmol/L (ref 135–145)
Total Bilirubin: 0.4 mg/dL (ref 0.3–1.2)
Total Protein: 7.5 g/dL (ref 6.5–8.1)

## 2019-03-24 LAB — CK TOTAL AND CKMB (NOT AT ARMC)
CK, MB: 3.3 ng/mL (ref 0.5–5.0)
Relative Index: INVALID (ref 0.0–2.5)
Total CK: 36 U/L — ABNORMAL LOW (ref 38–234)

## 2019-03-24 LAB — SAMPLE TO BLOOD BANK

## 2019-03-24 LAB — RESPIRATORY PANEL BY RT PCR (FLU A&B, COVID)
Influenza A by PCR: NEGATIVE
Influenza B by PCR: NEGATIVE
SARS Coronavirus 2 by RT PCR: NEGATIVE

## 2019-03-24 LAB — CDS SEROLOGY

## 2019-03-24 LAB — PROTIME-INR
INR: 1.1 (ref 0.8–1.2)
Prothrombin Time: 14.1 seconds (ref 11.4–15.2)

## 2019-03-24 MED ORDER — CLOMIPRAMINE HCL 25 MG PO CAPS
25.0000 mg | ORAL_CAPSULE | Freq: Two times a day (BID) | ORAL | Status: DC
  Administered 2019-03-24: 25 mg via ORAL
  Filled 2019-03-24 (×4): qty 1

## 2019-03-24 MED ORDER — LIDOCAINE 5 % EX PTCH: 1.0000 | MEDICATED_PATCH | CUTANEOUS | 0 refills | Status: DC

## 2019-03-24 MED ORDER — FENTANYL CITRATE (PF) 100 MCG/2ML IJ SOLN: 50.0000 ug | Freq: Once | INTRAMUSCULAR | Status: DC

## 2019-03-24 MED ORDER — CLOMIPRAMINE HCL 25 MG PO CAPS
25.0000 mg | ORAL_CAPSULE | Freq: Two times a day (BID) | ORAL | Status: DC
  Filled 2019-03-24 (×5): qty 1

## 2019-03-24 MED ORDER — IOHEXOL 300 MG/ML  SOLN
100.0000 mL | Freq: Once | INTRAMUSCULAR | Status: AC | PRN
  Administered 2019-03-24: 17:00:00 100 mL via INTRAVENOUS

## 2019-03-24 MED ORDER — SODIUM CHLORIDE 0.9 % IV BOLUS
500.0000 mL | Freq: Once | INTRAVENOUS | Status: AC
  Administered 2019-03-24: 500 mL via INTRAVENOUS

## 2019-03-24 NOTE — ED Notes (Signed)
Pt removed iv, took off b/p cuff and pulse ox. Has called out several times because she states she is ready to go home. Discussed with pt that we were waiting on results of some of the tests. Pt states she is ready to leave.provider aware.

## 2019-03-24 NOTE — ED Triage Notes (Signed)
Per caregiver patient fell this morning. Patient complains of left knee pain. Caregiver states that she has beginning stages of dementia and is worried that she broke her left knee.

## 2019-03-24 NOTE — ED Provider Notes (Signed)
[facility] EMERGENCY DEPARTMENT Provider Note   CSN: [id number] Arrival date & time: 03/24/19  1146   History Chief Complaint  Patient presents with  . Fall  . Leg Pain   Rebecca [name] is a 79 y.o. female with extensive past medical history, see below who presents for evaluation of fall.  Patient with 2 falls.  Both unwitnessed. Patient states she did get slightly dizzy with the first fall.  Chronic home oxygen 2 L.  Admits to headache, left knee pain.  States she is unable to walk secondary to pain.  She is compliant with her home medications.  Tolerating p.o. intake without difficulty. Ground level fall  Collateral from patients POA, Derenda Fennel.  Patient has recently been on antibiotics for upper respiratory infection as well as UTI.  She has been on Keflex.  Had a fall on Friday and injured her left knee.  Granddaughter says today patient went to go open the door and used a rolling bedside table instead of a walker and fell.  She found her on the floor.  Fall was unwitnessed.  Granddaughter is concerned that patient's memory has been worsening.  Currently patient stated over the weekend that she was seeing bugs on the wall.  She is also been calling her granddaughter by the wrong name.  According to granddaughter she does call other people the wrong name however not her. Incontinent at baseline. On home oxygen   Level 5 caveat- Dementia  HPI     Past Medical History:  Diagnosis Date  . Aortic stenosis   . Cataplexy   . Diastolic heart failure (HCC)   . Essential hypertension   . GERD (gastroesophageal reflux disease)   . Hiatal hernia   . History of recurrent deep vein thrombosis (DVT)   . Mitral stenosis   . Narcolepsy   . Neuropathy   . Renal disorder   . Type 2 diabetes mellitus [facility])     Patient Active Problem List   Diagnosis Date Noted  . Palliative care by specialist   . Goals of care, counseling/discussion   . Acute on chronic respiratory failure with  hypoxia (HCC) 01/01/2019  . Paroxysmal atrial fibrillation (HCC) 12/09/2018  . Acute on chronic respiratory failure (HCC) 12/09/2018  . Pneumonia due to COVID-19 virus 12/08/2018  . History of recurrent deep vein thrombosis (DVT)   . Type 2 diabetes mellitus (HCC)   . Lobar pneumonia (HCC) 09/05/2018  . Acute on chronic diastolic CHF (congestive heart failure) (HCC) 09/05/2018  . CHF (congestive heart failure) (HCC) 09/04/2018  . HCAP (healthcare-associated pneumonia) 03/29/2017  . Cellulitis 03/03/2017  . Type 2 diabetes mellitus with other specified complication (HCC) 03/03/2017  . CKD (chronic kidney disease), stage III 03/03/2017  . Status post placement of cardiac pacemaker 10/29/2016  . Pulmonary edema 10/23/2016  . Acute respiratory failure with hypoxia (HCC) 02/22/2015  . CAP (community acquired pneumonia) 02/22/2015  . Fall at home 09/21/2014  . Mild aortic stenosis 09/08/2014  . Mild to moderate  mitral stenosis 09/08/2014  . Chronic anticoagulation-Coumadin 09/08/2014  . Sepsis (HCC) 09/05/2014  . Elevated troponin 09/05/2014  . Lower leg DVT (deep venous thromboembolism), chronic (HCC) 09/05/2014  . Essential hypertension 09/05/2014  . Depression 09/05/2014  . Poorly controlled diabetes mellitus (HCC) 09/05/2014  . Hypomagnesemia 09/05/2014  . Hyponatremia 09/05/2014  . Pyelonephritis 09/05/2014  . Chronic diastolic CHF (congestive heart failure) (HCC)   . 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 GERD (gastroesophageal reflux  disease) 07/08/2011  . Gastroparesis 07/08/2011  . IDA (iron deficiency anemia) 07/08/2011  . History of colonic polyps 07/08/2011  . Narcolepsy-evaluated at [facility] 07/08/2011  . NAFLD (nonalcoholic fatty liver disease) 96/28/3662  . Obesity 07/08/2011    Past Surgical History:  Procedure Laterality Date  . ABDOMINAL HYSTERECTOMY    . CARPAL TUNNEL RELEASE    . CARPECTOMY  12/26/2011   Procedure: CARPECTOMY;  Surgeon:  Wyn Forster., MD;  Location: [facility];  Service: Orthopedics;  Laterality: Right;  PROXIMAL ROW CARPECTOMY RIGHT WRIST and neurectomy  . CHOLECYSTECTOMY    . HEMORROIDECTOMY    . KIDNEY SURGERY    . NEPHROSTOMY    . PACEMAKER IMPLANT N/A 10/25/2016   Procedure: Pacemaker Implant;  Surgeon: Regan Lemming, MD;  Location: [facility] INVASIVE CV LAB;  Service: Cardiovascular;  Laterality: N/A;  . TOTAL HIP ARTHROPLASTY     bilateral     OB History   No obstetric history on file.     Family History  Problem Relation Age of Onset  . Heart disease Father   . Diabetes Other   . Stroke Mother   . Scleroderma Brother     Social History   Tobacco Use  . Smoking status: Former Smoker    Quit date: 10/13/1988    Years since quitting: 30.4  . Smokeless tobacco: Never Used  Substance Use Topics  . Alcohol use: No    Alcohol/week: 0.0 standard drinks  . Drug use: No    Home Medications Prior to Admission medications   Medication Sig Start Date End Date Taking? Authorizing Provider  acetaminophen (TYLENOL) 650 MG CR tablet Take 1,300 mg by mouth in the morning and at bedtime.   Yes [provider]  carvedilol (COREG) 3.125 MG tablet Take 1 tablet (3.125 mg total) by mouth 2 (two) times daily with a meal. 01/07/19  Yes Emokpae, Courage, MD  Cholecalciferol (VITAMIN D3) 3000 UNITS TABS Take 1 capsule by mouth daily.   Yes [provider]  clobetasol cream (TEMOVATE) 0.05 % Apply 1 application topically daily.  11/11/18  Yes [provider]  clomiPRAMINE (ANAFRANIL) 25 MG capsule Take 1 capsule (25 mg total) by mouth 2 (two) times daily. Patient taking differently: Take 25 mg by mouth in the morning, at noon, in the evening, and at bedtime.  01/07/19  Yes Emokpae, Courage, MD  clonazePAM (KLONOPIN) 1 MG tablet Take 1 tablet (1 mg total) by mouth at bedtime. 12/15/18  Yes Leroy Sea, MD  diclofenac Sodium (VOLTAREN) 1 % GEL Apply 1  application topically daily as needed. 03/14/19  Yes [provider]  DULoxetine (CYMBALTA) 60 MG capsule Take 100 mg by mouth daily.    Yes [provider]  ferrous sulfate 325 (65 FE) MG tablet Take 325 mg by mouth 2 (two) times daily with a meal.    Yes [provider]  furosemide (LASIX) 40 MG tablet Take 0.5 tablets (20 mg total) by mouth 2 (two) times daily. 01/07/19  Yes Emokpae, Courage, MD  gabapentin (NEURONTIN) 300 MG capsule Take 300 mg by mouth 2 (two) times daily.   Yes [provider]  insulin glargine (LANTUS) 100 unit/mL SOPN Inject 0.2 mLs (20 Units total) into the skin daily. Patient taking differently: Inject 20-24 Units into the skin daily.  01/07/19  Yes Emokpae, Courage, MD  magnesium oxide (MAG-OX) 400 MG tablet Take 400 mg by mouth daily.   Yes [provider]  metFORMIN (GLUCOPHAGE) 500 MG tablet Take 500 mg by mouth 2 (two) times daily with a meal.   Yes [provider]  Multiple Vitamin (MULTIVITAMIN WITH MINERALS) TABS tablet Take 1 tablet by mouth daily.   Yes [provider]  potassium chloride (KLOR-CON) 10 MEQ tablet Take 1 tablet (10 mEq total) by mouth daily. 01/07/19  Yes Emokpae, Courage, MD  QUEtiapine (SEROQUEL) 100 MG tablet Take 100 mg by mouth at bedtime.   Yes [provider]  TURMERIC PO Take 1 capsule by mouth daily.   Yes [provider]  vitamin C (ASCORBIC ACID) 500 MG tablet Take 500 mg by mouth daily.   Yes [provider]  warfarin (COUMADIN) 5 MG tablet Take 2.5 mg by mouth daily.    Yes [provider]  zinc sulfate 220 (50 Zn) MG capsule Take 220 mg by mouth daily.   Yes [provider]  lidocaine (LIDODERM) 5 % Place 1 patch onto the skin daily. Remove & Discard patch within 12 hours or as directed by MD 03/24/19   Ido Wollman A, PA-C    Allergies    Codeine, Compazine, Other, Penicillins, Morphine and related, and Demerol  [meperidine]  Review of Systems   Review of Systems  Unable to perform ROS: Dementia  HENT: Negative.   Respiratory: Negative.   Cardiovascular: Negative.   Gastrointestinal: Negative.   Genitourinary: Positive for dysuria.  Musculoskeletal: Positive for neck pain.       Left leg pain  Skin: Positive for wound.  Neurological: Positive for weakness and headaches.  All other systems reviewed and are negative.   Physical Exam Updated Vital Signs BP (!) 148/65   Pulse (!) 58   Temp 97.6 F (36.4 C) (Oral)   Resp 20   Ht 5\' 8"  (1.727 m)   Wt 90.4 kg   SpO2 96%   BMI 30.30 kg/m   Physical Exam Vitals and nursing note reviewed. Exam conducted with a chaperone present.  Constitutional:      General: She is not in acute distress.    Appearance: She is well-developed. She is obese. She is ill-appearing. She is not toxic-appearing.  HENT:     Head: Normocephalic.     Jaw: There is normal jaw occlusion.     Comments: Mild erythema without crepitus, stepoffs to forehead.    Nose: Nose normal.     Comments: No septal hematoma    Mouth/Throat:     Mouth: Mucous membranes are dry.     Pharynx: Oropharynx is clear. Uvula midline.  Eyes:     Extraocular Movements: Extraocular movements intact.     Conjunctiva/sclera: Conjunctivae normal.     Pupils: Pupils are equal, round, and reactive to light.     Comments: EOM intact. No nystagmus  Neck:     Trachea: Trachea and phonation normal.     Comments: No midline tenderness to palpation or step offs Cardiovascular:     Rate and Rhythm: Normal rate.     Pulses: Normal pulses.          Radial pulses are 2+ on the right side and 2+ on the left side.       Dorsalis pedis pulses are 2+ on the right side and 2+ on the left side.       Posterior tibial pulses are 2+ on the right side and 2+ on the left side.  Pulmonary:     Effort: No respiratory distress.     Comments:  Minimal rhonchi throughout. On home 2 L [place] Chest:     Comments:  Anterior chest wall tenderness to palpation without crepitus or step offs Abdominal:     General: Bowel sounds are normal. There is no distension.     Palpations: Abdomen is soft.     Tenderness: There is no abdominal tenderness. There is no right CVA tenderness, left CVA tenderness, guarding or rebound.     Hernia: No hernia is present.     Comments: Soft, non tender without rebound or guarding  Musculoskeletal:        General: Normal range of motion.     Cervical back: Full passive range of motion without pain and normal range of motion.     Comments: Mild mild lumbar tenderness to palpation to lumbar spine.  Diffuse tenderness palpation to right knee with swelling more so at lateral knee.  She has diffuse tenderness to her proximal tibia and fibula.  She has some overlying skin changes to anterior aspect of her left lower extremity.  Able to raise at bilateral arms without difficulty.  She is able to flex and extend at bilateral knees however has pain with flexion extension at right knee.  Wiggles toes bilaterally.  No shortening or rotation of legs.  Feet:     Right foot:     Toenail Condition: Right toenails are abnormally thick and long.     Left foot:     Toenail Condition: Left toenails are abnormally thick and long.  Skin:    General: Skin is warm and dry.     Capillary Refill: Capillary refill takes 2 to 3 seconds.     Comments: Skin changes to left lower extremity that appear chronic in nature.  Neurological:     Mental Status: Mental status is at baseline. She is confused.     Cranial Nerves: Cranial nerves are intact.     Comments: Unable to ambulate secondary to pain to left lower extremity.  Intact sensation to bilateral upper and lower extremities without difficulty. 4+/5 strength to bil extremities equally. Alert to person, place-hospital. No oriented to time, year-2019    ED Results / Procedures / Treatments   Labs (all labs ordered are listed, but only abnormal results  are displayed) Labs Reviewed  COMPREHENSIVE METABOLIC PANEL - Abnormal; Notable for the following components:      Result Value   Chloride 97 (*)    Glucose, Bld 220 (*)    BUN 30 (*)    Creatinine, Ser 1.03 (*)    GFR calc non Af Amer 52 (*)    GFR calc Af Amer 60 (*)    All other components within normal limits  CK TOTAL AND CKMB (NOT AT [facility]) - Abnormal; Notable for the following components:   Total CK 36 (*)    All other components within normal limits  RESPIRATORY PANEL BY RT PCR (FLU A&B, COVID)  CBC  ETHANOL  PROTIME-INR  CDS SEROLOGY  URINALYSIS, ROUTINE W REFLEX MICROSCOPIC  LACTIC ACID, PLASMA  I-STAT CHEM 8, ED  SAMPLE TO BLOOD BANK    EKG EKG Interpretation  Date/Time:  Wednesday March 24 2019 12:04:38 EST Ventricular Rate:  60 PR Interval:    QRS Duration: 156 QT Interval:  512 QTC Calculation: 512 R Axis:   -56 Text Interpretation: Sinus rhythm Left bundle branch block Confirmed by Kennis Carina [phone]) on 03/24/2019 1:32:44 PM   Radiology DG Chest 1 View  Result Date: 03/24/2019 CLINICAL DATA:  Fall  EXAM: CHEST  1 VIEW COMPARISON:  01/01/2019 FINDINGS: Pulmonary vascular congestion with possible mild interstitial edema. No pleural effusions or pneumothorax. Mild cardiomegaly. Left chest wall dual lead pacemaker. IMPRESSION: Pulmonary vascular congestion with possible mild interstitial edema. Electronically Signed   By: Guadlupe Spanish M.D.   On: 03/24/2019 13:34   DG Pelvis 1-2 Views  Result Date: 03/24/2019 CLINICAL DATA:  Recent fall with pelvic pain, initial encounter EXAM: PELVIS - 1 VIEW COMPARISON:  09/04/2005 FINDINGS: Bilateral hip replacements are noted. Pelvic ring is intact. No soft tissue abnormality is seen. No acute fracture is noted. IMPRESSION: No acute abnormality noted. Electronically Signed   By: Alcide Clever M.D.   On: 03/24/2019 13:32   DG Tibia/Fibula Left  Result Date: 03/24/2019 CLINICAL DATA:  Left leg pain after fall EXAM:  LEFT FEMUR 2 VIEWS; LEFT KNEE - COMPLETE 4+ VIEW; LEFT TIBIA AND FIBULA - 2 VIEW COMPARISON:  None. FINDINGS: Left total hip arthroplasty appears intact in its expected alignment without periprosthetic lucency or fracture. Femur intact. Mild tricompartmental degenerative changes of the left knee without evidence of fracture. No knee joint effusion. Mild prepatellar soft tissue swelling. Dedicated views of the left tibia and fibula reveal no acute fracture or malalignment. Soft tissues within normal limits. IMPRESSION: 1. No acute fracture or malalignment of the imaged left lower extremity. 2. Mild prepatellar soft tissue swelling. 3. Mild tricompartmental degenerative changes of the left knee. Electronically Signed   By: Duanne Guess D.O.   On: 03/24/2019 13:34   CT HEAD WO CONTRAST  Result Date: 03/24/2019 CLINICAL DATA:  Fall EXAM: CT HEAD WITHOUT CONTRAST TECHNIQUE: Contiguous axial images were obtained from the base of the skull through the vertex without intravenous contrast. COMPARISON:  10/23/2016 FINDINGS: Brain: There is no acute intracranial hemorrhage, mass-effect, or edema. Gray-white differentiation is preserved. There is no extra-axial fluid collection. Prominence of the ventricles and sulci reflects stable generalized parenchymal volume loss. Patchy and confluent areas of hypoattenuation in the supratentorial white matter are nonspecific but probably reflect stable moderate chronic microvascular ischemic changes. There is a new but chronic appearing small vessel infarct of the left corona radiata. Vascular: There is atherosclerotic calcification at the skull base. Skull: Calvarium is unremarkable. Sinuses/Orbits: No acute finding. Other: Minimal left mastoid opacification. IMPRESSION: No evidence of acute intracranial injury. Chronic/nonemergent findings detailed above. Electronically Signed   By: Guadlupe Spanish M.D.   On: 03/24/2019 13:43   CT CHEST W CONTRAST  Result Date:  03/24/2019 CLINICAL DATA:  Fall this morning with right facial injury. EXAM: CT CHEST, ABDOMEN, AND PELVIS WITH CONTRAST TECHNIQUE: Multidetector CT imaging of the chest, abdomen and pelvis was performed following the standard protocol during bolus administration of intravenous contrast. CONTRAST:  OMNIPAQUE IOHEXOL 300 MG/ML  SOLN COMPARISON:  09/04/2018 chest CT. 03/02/2018 CT abdomen/pelvis. Chest radiograph from earlier today. FINDINGS: CT CHEST FINDINGS Cardiovascular: Mild cardiomegaly. No significant pericardial fluid/thickening. Three-vessel coronary atherosclerosis. Stable configuration of 2 lead left subclavian pacemaker with lead tips in the right atrium and right ventricular apex. Atherosclerotic nonaneurysmal thoracic aorta. Top-normal caliber main pulmonary artery (3.3 cm diameter). No evidence of acute thoracic aortic injury. No central pulmonary emboli. Mediastinum/Nodes: No pneumomediastinum. No mediastinal hematoma. Enlarged multinodular goiter asymmetrically involving the left thyroid lobe with dominant heterogeneous hypodense 4.7 cm left thyroid nodule, not definitely changed since 09/04/2018 CT. Mild right tracheal deviation by the goiter. Unremarkable esophagus. No axillary adenopathy. Right paratracheal adenopathy up to 1.6 cm, increased from 1.2 cm on  09/04/2018 CT. Enlarged 1.7 cm subcarinal node (series 2/image 28), increased from 1.4 cm. No hilar adenopathy. Lungs/Pleura: No pneumothorax. Small dependent bilateral pleural effusions, left greater than right. Interlobular septal thickening and patchy subpleural reticulation and ground-glass opacity throughout both lungs, similar. Mosaic attenuation throughout both lungs, similar to mildly worsened. Solid 6 mm peripheral right lower lobe pulmonary nodule (series 3/image 105), stable. No acute consolidative airspace disease, lung masses or new significant pulmonary nodules. Mild passive atelectasis in the dependent lower lobes  bilaterally. Musculoskeletal: No aggressive appearing focal osseous lesions. No acute fracture detected in the chest. Chronic mild superior T12 vertebral compression fractures unchanged. Mild thoracic spondylosis. CT ABDOMEN PELVIS FINDINGS Hepatobiliary: Normal liver size. No definite liver surface irregularity. Hypervascular 2.0 cm inferior right liver focus (series 2/image 59), faintly visualized and stable since 03/02/2018 CT abdomen study. No new liver lesions. No liver laceration. Cholecystectomy. Bile ducts are stable and within normal post cholecystectomy limits. Pancreas: Normal, with no laceration, mass or duct dilation. Spleen: Normal size. No laceration or mass. Adrenals/Urinary Tract: Normal adrenals. No hydronephrosis. No renal laceration. Subcentimeter hypodense renal cortical lesion in the posterior lower left kidney is too small to characterize and is unchanged, considered benign. No new renal lesions. Punctate nonobstructing lower right renal stone. Bladder obscured by streak artifact from bilateral hip hardware with no gross bladder abnormality. Stomach/Bowel: Large hiatal hernia. Otherwise normal nondistended stomach. Normal caliber small bowel with no small bowel wall thickening. Normal appendix. Marked sigmoid diverticulosis, with no large bowel wall thickening or acute pericolonic fat stranding. Vascular/Lymphatic: Atherosclerotic nonaneurysmal abdominal aorta. Patent portal, splenic, hepatic and renal veins. No pathologically enlarged lymph nodes in the abdomen or pelvis. Reproductive: Status post hysterectomy, with no abnormal findings at the vaginal cuff. No adnexal mass. Other: No pneumoperitoneum, ascites or focal fluid collection. Musculoskeletal: No aggressive appearing focal osseous lesions. No fracture in the abdomen or pelvis. 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 throughout the lumbar spine. IMPRESSION: 1. No evidence  of acute traumatic injury in the chest, abdomen or pelvis. 2. Spectrum of findings suggestive of congestive heart failure, including cardiomegaly, small dependent bilateral pleural effusions, and interlobular septal thickening throughout both lungs. 3. Nonspecific chronic mosaic attenuation throughout both lungs, which may be due to mosaic perfusion from pulmonary vascular disease or air trapping from small airways disease. 4. Chronic nonspecific subpleural reticulation and ground-glass opacity throughout both lungs, similar, cannot exclude superimposed interstitial lung disease. Dedicated high-resolution chest CT follow-up in 6 months may be considered. 5. Nonspecific mild mediastinal lymphadenopathy, increased. Suggest attention on follow-up chest CT with IV contrast in 3 months. 6. Large hiatal hernia. 7. Punctate nonobstructing lower right renal stone. 8. Marked sigmoid diverticulosis. 9. Three-vessel coronary atherosclerosis. 10. 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 Recommend thyroid US (ref: J Am Coll Radiol. 2015 Feb;12(2): 143-50). Electronically Signed   By: Ilona Sorrel M.D.   On: 03/24/2019 18:09   CT CERVICAL SPINE WO CONTRAST  Result Date: 03/24/2019 CLINICAL DATA:  Fall EXAM: CT CERVICAL SPINE WITHOUT CONTRAST TECHNIQUE: Multidetector CT imaging of the cervical spine was performed without intravenous contrast. Multiplanar CT image reconstructions were also generated. COMPARISON:  2018 FINDINGS: Alignment: Stable. Skull base and vertebrae: Stable vertebral body heights. No acute cervical spine fracture. Soft tissues and spinal canal: No prevertebral fluid or swelling. No visible canal hematoma. Disc levels: Multilevel degenerative changes are  present including disc space narrowing, endplate osteophytes, and facet and uncovertebral hypertrophy. No high-grade canal stenosis.  Upper chest: No apical lung mass. Other: Partially imaged enlarged, multinodular left thyroid lobe. IMPRESSION: No acute cervical spine fracture. Electronically Signed   By: Guadlupe Spanish M.D.   On: 03/24/2019 13:53   CT ABDOMEN PELVIS W CONTRAST  Result Date: 03/24/2019 CLINICAL DATA:  Fall this morning with right facial injury. EXAM: CT CHEST, ABDOMEN, AND PELVIS WITH CONTRAST TECHNIQUE: Multidetector CT imaging of the chest, abdomen and pelvis was performed following the standard protocol during bolus administration of intravenous contrast. CONTRAST:  OMNIPAQUE IOHEXOL 300 MG/ML  SOLN COMPARISON:  09/04/2018 chest CT. 03/02/2018 CT abdomen/pelvis. Chest radiograph from earlier today. FINDINGS: CT CHEST FINDINGS Cardiovascular: Mild cardiomegaly. No significant pericardial fluid/thickening. Three-vessel coronary atherosclerosis. Stable configuration of 2 lead left subclavian pacemaker with lead tips in the right atrium and right ventricular apex. Atherosclerotic nonaneurysmal thoracic aorta. Top-normal caliber main pulmonary artery (3.3 cm diameter). No evidence of acute thoracic aortic injury. No central pulmonary emboli. Mediastinum/Nodes: No pneumomediastinum. No mediastinal hematoma. Enlarged multinodular goiter asymmetrically involving the left thyroid lobe with dominant heterogeneous hypodense 4.7 cm left thyroid nodule, not definitely changed since 09/04/2018 CT. Mild right tracheal deviation by the goiter. Unremarkable esophagus. No axillary adenopathy. Right paratracheal adenopathy up to 1.6 cm, increased from 1.2 cm on 09/04/2018 CT. Enlarged 1.7 cm subcarinal node (series 2/image 28), increased from 1.4 cm. No hilar adenopathy. Lungs/Pleura: No pneumothorax. Small dependent bilateral pleural effusions, left greater than right. Interlobular septal thickening and patchy subpleural reticulation and ground-glass opacity throughout both lungs, similar. Mosaic attenuation throughout both lungs,  similar to mildly worsened. Solid 6 mm peripheral right lower lobe pulmonary nodule (series 3/image 105), stable. No acute consolidative airspace disease, lung masses or new significant pulmonary nodules. Mild passive atelectasis in the dependent lower lobes bilaterally. Musculoskeletal: No aggressive appearing focal osseous lesions. No acute fracture detected in the chest. Chronic mild superior T12 vertebral compression fractures unchanged. Mild thoracic spondylosis. CT ABDOMEN PELVIS FINDINGS Hepatobiliary: Normal liver size. No definite liver surface irregularity. Hypervascular 2.0 cm inferior right liver focus (series 2/image 59), faintly visualized and stable since 03/02/2018 CT abdomen study. No new liver lesions. No liver laceration. Cholecystectomy. Bile ducts are stable and within normal post cholecystectomy limits. Pancreas: Normal, with no laceration, mass or duct dilation. Spleen: Normal size. No laceration or mass. Adrenals/Urinary Tract: Normal adrenals. No hydronephrosis. No renal laceration. Subcentimeter hypodense renal cortical lesion in the posterior lower left kidney is too small to characterize and is unchanged, considered benign. No new renal lesions. Punctate nonobstructing lower right renal stone. Bladder obscured by streak artifact from bilateral hip hardware with no gross bladder abnormality. Stomach/Bowel: Large hiatal hernia. Otherwise normal nondistended stomach. Normal caliber small bowel with no small bowel wall thickening. Normal appendix. Marked sigmoid diverticulosis, with no large bowel wall thickening or acute pericolonic fat stranding. Vascular/Lymphatic: Atherosclerotic nonaneurysmal abdominal aorta. Patent portal, splenic, hepatic and renal veins. No pathologically enlarged lymph nodes in the abdomen or pelvis. Reproductive: Status post hysterectomy, with no abnormal findings at the vaginal cuff. No adnexal mass. Other: No pneumoperitoneum, ascites or focal fluid collection.  Musculoskeletal: No aggressive appearing focal osseous lesions. No fracture in the abdomen or pelvis. 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 throughout the lumbar spine. IMPRESSION: 1. No evidence of acute traumatic injury in the chest, abdomen or pelvis. 2. Spectrum of findings suggestive of congestive heart failure,  including cardiomegaly, small dependent bilateral pleural effusions, and interlobular septal thickening throughout both lungs. 3. Nonspecific chronic mosaic attenuation throughout both lungs, which may be due to mosaic perfusion from pulmonary vascular disease or air trapping from small airways disease. 4. Chronic nonspecific subpleural reticulation and ground-glass opacity throughout both lungs, similar, cannot exclude superimposed interstitial lung disease. Dedicated high-resolution chest CT follow-up in 6 months may be considered. 5. Nonspecific mild mediastinal lymphadenopathy, increased. Suggest attention on follow-up chest CT with IV contrast in 3 months. 6. Large hiatal hernia. 7. Punctate nonobstructing lower right renal stone. 8. Marked sigmoid diverticulosis. 9. Three-vessel coronary atherosclerosis. 10. 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 Recommend thyroid US (ref: J Am Coll Radiol. 2015 Feb;12(2): 143-50). Electronically Signed   By: Delbert Phenix M.D.   On: 03/24/2019 18:09   CT L-SPINE NO CHARGE  Result Date: 03/24/2019 CLINICAL DATA:  79 year old female status post fall this morning. EXAM: CT LUMBAR SPINE WITH CONTRAST TECHNIQUE: Technique: Multiplanar CT images of the lumbar spine were reconstructed from contemporary CT of the Abdomen and Pelvis. CONTRAST:  No additional COMPARISON:  CT Chest, Abdomen, and Pelvis today are reported separately. Chest CT 01/01/2019 lumbar MRI 08/04/2003. 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 Correlation with radiographs is recommended prior to any operative intervention. Alignment: Moderate dextroconvex lumbar scoliosis. Lumbar lordosis has not significantly changed since 2005. Chronic grade 1 anterolisthesis of S1 on S2 with associated chronic pars fractures. Vertebrae: Mild to moderate L1 superior endplate compression fracture is stable since 2020. Advanced degenerative endplate changes throughout the lumbar spine with sclerosis and subchondral cysts. Chronic bilateral S1 pars fractures. No acute osseous abnormality identified. Visible sacrum and SI joints appear intact. Paraspinal and other soft tissues: Chest, abdominal, and pelvic viscera are reported separately today. The lumbar paraspinal soft tissues are remarkable for suspected left lower psoas muscle hematoma measuring about 2 cm on series 14, image 88. Mild if any regional stranding. Elsewhere the paraspinal soft tissues are within normal limits. Disc levels: Chronically advanced disc degeneration with vacuum disc throughout the lower thoracic and lumbar spine. Multilevel superimposed posterior element degeneration, maximal at the S1-S2 level corresponding to chronic pars fractures. There is mild to moderate multifactorial spinal stenosis at L4-L5 and L5-S1. IMPRESSION: 1. 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 2. No acute osseous abnormality in the lumbar spine. L1 superior endplate compression fracture is stable since last year. 3. Small 2 cm left paraspinal lower psoas muscle hematoma suspected on series 14, image 88. 4. Diffusely advanced lower thoracic and lumbar spine degeneration with mild to moderate spinal stenosis at L4-L5 and L5-S1. 5.  CT Chest, Abdomen, and Pelvis today are reported separately. Electronically Signed   By: Odessa Fleming M.D.    On: 03/24/2019 17:49   DG Knee Complete 4 Views Left  Result Date: 03/24/2019 CLINICAL DATA:  Left leg pain after fall EXAM: LEFT FEMUR 2 VIEWS; LEFT KNEE - COMPLETE 4+ VIEW; LEFT TIBIA AND FIBULA - 2 VIEW COMPARISON:  None. FINDINGS: Left total hip arthroplasty appears intact in its expected alignment without periprosthetic lucency or fracture. Femur intact. Mild tricompartmental degenerative changes of the left knee without evidence of fracture. No knee joint effusion. Mild prepatellar soft tissue swelling. Dedicated views of the left tibia and fibula  reveal no acute fracture or malalignment. Soft tissues within normal limits. IMPRESSION: 1. No acute fracture or malalignment of the imaged left lower extremity. 2. Mild prepatellar soft tissue swelling. 3. Mild tricompartmental degenerative changes of the left knee. Electronically Signed   By: Duanne Guess D.O.   On: 03/24/2019 13:34   DG Femur Min 2 Views Left  Result Date: 03/24/2019 CLINICAL DATA:  Left leg pain after fall EXAM: LEFT FEMUR 2 VIEWS; LEFT KNEE - COMPLETE 4+ VIEW; LEFT TIBIA AND FIBULA - 2 VIEW COMPARISON:  None. FINDINGS: Left total hip arthroplasty appears intact in its expected alignment without periprosthetic lucency or fracture. Femur intact. Mild tricompartmental degenerative changes of the left knee without evidence of fracture. No knee joint effusion. Mild prepatellar soft tissue swelling. Dedicated views of the left tibia and fibula reveal no acute fracture or malalignment. Soft tissues within normal limits. IMPRESSION: 1. No acute fracture or malalignment of the imaged left lower extremity. 2. Mild prepatellar soft tissue swelling. 3. Mild tricompartmental degenerative changes of the left knee. Electronically Signed   By: Duanne Guess D.O.   On: 03/24/2019 13:34   CT MAXILLOFACIAL WO CONTRAST  Result Date: 03/24/2019 CLINICAL DATA:  Fall, facial trauma EXAM: CT MAXILLOFACIAL WITHOUT CONTRAST TECHNIQUE: Multidetector CT  imaging of the maxillofacial structures was performed. Multiplanar CT image reconstructions were also generated. COMPARISON:  None. FINDINGS: Osseous: No acute facial fracture. Temporomandibular joints are unremarkable. Orbits: No intraorbital hematoma. Sinuses: Aerated. Soft tissues: No significant hematoma.  Otherwise unremarkable. Limited intracranial: Dictated separately. IMPRESSION: No acute facial fracture. Electronically Signed   By: Guadlupe Spanish M.D.   On: 03/24/2019 13:46    Procedures Procedures (including critical care time)  Medications Ordered in ED Medications  fentaNYL (SUBLIMAZE) injection 50 mcg (50 mcg Intravenous Not Given 03/24/19 1706)  clomiPRAMINE (ANAFRANIL) capsule 25 mg (25 mg Oral Given 03/24/19 1655)  sodium chloride 0.9 % bolus 500 mL (0 mLs Intravenous Stopped 03/24/19 1542)  iohexol (OMNIPAQUE) 300 MG/ML solution 100 mL (100 mLs Intravenous Contrast Given 03/24/19 1728)    ED Course  I have reviewed the triage vital signs and the nursing notes.  Pertinent labs & imaging results that were available during my care of the patient were reviewed by me and considered in my medical decision making (see chart for details).  79 year old female known history of dementia presents for evaluation after fall.  2 falls over the last 3 days.  He is afebrile however chronically ill-appearing.  She appears nontoxic.  She is currently on antibiotics for upper respiratory infection and UTI.  Per granddaughter who is POA patient has been more confused than normal.  She got up to open the door today and used a rolling bedside table instead of her walker and had a fall.  This was unwitnessed.  She is on Coumadin for chronic DVT.  She has some frontal head tenderness as well as some anterior chest wall tenderness.  No shortening or rotation of legs.  She is neurovascularly intact.  She has some chronic skin changes to her left lower extremity however does have some significant edema, more so  to lateral left knee than right.  She is able to flex and extend however has pain with flexion extension to right knee.  Also has some tenderness to her tibia and fibula.  She is not followed by orthopedics.  We will plan on checking labs, EKG, imaging and reevaluate.  Clinical Course as of Mar 23 1825  Wed Mar 24, 2019  1458 No acute findings  CT CERVICAL SPINE WO CONTRAST [BH]  1458 No acute findings  CT MAXILLOFACIAL WO CONTRAST [BH]  1459 No evidence of acute intracranial injury, chronic vascular disease  CT HEAD WO CONTRAST [BH]  1459 Pulmonary vascular congestion  DG Chest 1 View [BH]  1459 No acute fracture or dislocation.  DG Femur Min 2 Views Left [BH]  1459 Prepatellar swelling however no acute fracture dislocation  DG Knee Complete 4 Views Left [BH]  1500 No acute fracture or dislocation  DG Tibia/Fibula Left [BH]  1500 No acute fracture   DG Pelvis 1-2 Views [BH]  1500 No STEMI, left bundle, similar compared to previous  EKG 12-Lead [BH]  1500 No leukocytosis, hemoglobin 12.1  CBC [BH]  1500 Respiratory panel negative  Respiratory Panel by RT PCR (Flu A&B, Covid) - Nasopharyngeal Swab [BH]  1500 Negative  Ethanol [BH]  1500 1.1  Protime-INR [BH]  1728 Mild hyperglycemia at 202, creatinine 1.93, labs appear to be at baseline  Comprehensive metabolic panel(!) [BH]  1824 No acute findings in the chest.  Cardiomegaly consistent with prior diagnosis of CHF.  No hypoxia, tachycardia.  No evidence of fluid overload  CT CHEST W CONTRAST [BH]  1825 No traumatic injuries to abdomen.  Left renal stone, diverticulosis  CT ABDOMEN PELVIS W CONTRAST [BH]  1826 No acute findings in spine  CT L-SPINE NO CHARGE [BH]    Clinical Course User Index [BH] Debra Colon A, PA-C    Labs and imaging without any significant abnormality. She has refused urine cath and urine sample multiple times.  Discussed importance of this.  Patient again refuses.  She has ambulated from bed to the  wheelchair with minimal assist from staff.  Patient has taken out her IV and states she is ready to go home.  Discussed with patient we are waiting on CT abdomen and chest.  She was understanding of this and continues to request to go home.  Discussed with POA granddaughter [name].  Discussed unable to obtain urine sample.  States she will follow with PCP if she has concerns for this.  Patient's been ambulatory here in the ED.  No evidence of acute traumatic findings.  Patient placed in knee sleeve.  She uses walker at home.  Discussed close PCP follow-up return to emergency department for any worsening symptoms.  The patient has been appropriately medically screened and/or stabilized in the ED. I have low suspicion for any other emergent medical condition which would require further screening, evaluation or treatment in the ED or require inpatient management.  Patient is hemodynamically stable and in no acute distress.  Patient able to ambulate in department prior to ED.  Evaluation does not show acute pathology that would require ongoing or additional emergent interventions while in the emergency department or further inpatient treatment.  I have discussed the diagnosis with the patient and answered all questions.  Pain is been managed while in the emergency department and patient has no further complaints prior to discharge.  Patient is comfortable with plan discussed in room and is stable for discharge at this time.  I have discussed strict return precautions for returning to the emergency department.  Patient was encouraged to follow-up with PCP/specialist refer to at discharge.   Patient seen eval by attending, Dr. Pilar Plate who agrees with the treatment, plan and disposition.    MDM Rules/Calculators/A&P  Final Clinical Impression(s) / ED Diagnoses Final diagnoses:  Fall  Acute pain of left knee    Rx / DC Orders ED Discharge Orders         Ordered    lidocaine  (LIDODERM) 5 %  Every 24 hours     03/24/19 1821           Estefana Taylor A, PA-C 03/24/19 1827    Sabas Sous, MD 03/25/19 [phone]

## 2019-03-24 NOTE — Discharge Instructions (Signed)
Return for new or worsening symptoms

## 2019-03-24 NOTE — ED Notes (Addendum)
Pt refused second attempt at I/O cath. Provider aware

## 2019-03-24 NOTE — ED Notes (Signed)
Order for knee sleeve, none available , provider aware

## 2019-03-25 ENCOUNTER — Telehealth: Payer: Self-pay | Admitting: Cardiovascular Disease

## 2019-03-25 NOTE — Telephone Encounter (Signed)
I reviewed all labs and studies.  I am not certain that the pacemaker will show anything significant.  She did appear to be somewhat dehydrated.  Have her reduce Lasix to 20 mg daily but to weigh herself daily and if weight increases by 3 pounds in 24 hours or 5 pounds in a week to take an extra 20 mg of Lasix.  Can always ask one of the pacemaker nurses to interrogate the device.

## 2019-03-25 NOTE — Telephone Encounter (Signed)
Please give [name], pt's granddaughter and care taker a call - pt has fallen and has a few concerns about what is going on.   [phone]

## 2019-03-25 NOTE — Telephone Encounter (Signed)
Ok. Then I would recommend significantly increasing water consumption because if she is intravascularly dry, she is more prone to hypotension, dizziness, and falls.

## 2019-03-25 NOTE — Telephone Encounter (Signed)
Spoke with Granddaughter [name] recommendations given. [name] voiced understanding.

## 2019-03-25 NOTE — Telephone Encounter (Signed)
Pt fell yesterday morning at 0850 was taken to the ER for evaluation and released. Pt has increased confusion over night. Family concerned that pt may have damaged pacemaker during the fall and would like to have it checked. Family is also concerned that it may show something on her devices history as to why she has increased confusion.

## 2019-03-25 NOTE — Telephone Encounter (Signed)
Manual PPM transmission has not yet been received. Scheduled for additional automatic transmission on 03/26/19.

## 2019-03-25 NOTE — Telephone Encounter (Signed)
Spoke with [name] granddaughter. Recommendations given. [name] stated that they are unable to weight the pt daily and feels she will "hold too much fluid " if the lasix is reduced. Informed granddaughter that pt was dehydrated at time of lab draw.  Granddaughter states that the patient did not have much to drink that day, she would like to hold off on decreasing lasix at this time. Please advise.

## 2019-03-25 NOTE — Telephone Encounter (Signed)
Left message requesting a manual transmission of device be sent. Office CB# given for questions. Will monitor for transmission, and attempt call back if not received by this afternoon.   Casimiro Needle [address] [place], PA-C  03/25/2019 11:20 AM

## 2019-03-26 NOTE — Telephone Encounter (Signed)
Spoke with [name], advised of normal PPM function. All pacemaker-related questions answered, no new concerns at this time.

## 2019-03-26 NOTE — Telephone Encounter (Signed)
Automatic transmission received. Normal PPM function. Battery and lead trends stable. Known persistent AF since 12/2018, on warfarin, V rates controlled. No ventricular arrhythmia episodes. AP 0.3%, VP 98.8%.  Will contact patient/granddaughter to make them aware of stable PPM function.

## 2019-04-20 ENCOUNTER — Ambulatory Visit (INDEPENDENT_AMBULATORY_CARE_PROVIDER_SITE_OTHER): Payer: Medicare Other | Admitting: *Deleted

## 2019-04-20 DIAGNOSIS — I5033 Acute on chronic diastolic (congestive) heart failure: Secondary | ICD-10-CM

## 2019-04-20 LAB — CUP PACEART REMOTE DEVICE CHECK
Battery Remaining Longevity: 108 mo
Battery Voltage: 2.99 V
Brady Statistic AP VP Percent: 0 %
Brady Statistic AP VS Percent: 0 %
Brady Statistic AS VP Percent: 0 %
Brady Statistic AS VS Percent: 0 %
Brady Statistic RA Percent Paced: 0.26 %
Brady Statistic RV Percent Paced: 99 %
Date Time Interrogation Session: 20210316002821
Implantable Lead Implant Date: 20180921
Implantable Lead Implant Date: 20180921
Implantable Lead Location: 753859
Implantable Lead Location: 753860
Implantable Lead Model: 5076
Implantable Lead Model: 5076
Implantable Pulse Generator Implant Date: 20180921
Lead Channel Impedance Value: 323 Ohm
Lead Channel Impedance Value: 361 Ohm
Lead Channel Impedance Value: 456 Ohm
Lead Channel Impedance Value: 570 Ohm
Lead Channel Pacing Threshold Amplitude: 0.5 V
Lead Channel Pacing Threshold Amplitude: 0.75 V
Lead Channel Pacing Threshold Pulse Width: 0.4 ms
Lead Channel Pacing Threshold Pulse Width: 0.4 ms
Lead Channel Sensing Intrinsic Amplitude: 2.125 mV
Lead Channel Sensing Intrinsic Amplitude: 2.125 mV
Lead Channel Sensing Intrinsic Amplitude: 6.375 mV
Lead Channel Sensing Intrinsic Amplitude: 6.375 mV
Lead Channel Setting Pacing Amplitude: 1.5 V
Lead Channel Setting Pacing Amplitude: 2.5 V
Lead Channel Setting Pacing Pulse Width: 0.4 ms
Lead Channel Setting Sensing Sensitivity: 1.2 mV

## 2019-04-21 NOTE — Progress Notes (Signed)
PPM Remote  

## 2019-06-10 ENCOUNTER — Other Ambulatory Visit (HOSPITAL_COMMUNITY)
Admission: RE | Admit: 2019-06-10 | Discharge: 2019-06-10 | Disposition: A | Discharge status: Home / Self Care | Payer: Medicare Other | Source: Hospice | Attending: Hematology and Oncology | Admitting: Hematology and Oncology

## 2019-06-10 ENCOUNTER — Telehealth (INDEPENDENT_AMBULATORY_CARE_PROVIDER_SITE_OTHER): Payer: Medicare Other | Admitting: Internal Medicine

## 2019-06-10 ENCOUNTER — Encounter: Payer: Self-pay | Admitting: Internal Medicine

## 2019-06-10 DIAGNOSIS — I442 Atrioventricular block, complete: Secondary | ICD-10-CM

## 2019-06-10 DIAGNOSIS — N390 Urinary tract infection, site not specified: Secondary | ICD-10-CM | POA: Diagnosis present

## 2019-06-10 DIAGNOSIS — I4819 Other persistent atrial fibrillation: Secondary | ICD-10-CM | POA: Diagnosis not present

## 2019-06-10 DIAGNOSIS — Z950 Presence of cardiac pacemaker: Secondary | ICD-10-CM

## 2019-06-10 LAB — URINALYSIS, ROUTINE W REFLEX MICROSCOPIC
Bilirubin Urine: NEGATIVE
Glucose, UA: NEGATIVE mg/dL
Ketones, ur: NEGATIVE mg/dL
Nitrite: POSITIVE — AB
Protein, ur: 100 mg/dL — AB
RBC / HPF: >50 RBC/hpf — ABNORMAL HIGH (ref 0–5)
Specific Gravity, Urine: 1.016 (ref 1.005–1.030)
WBC, UA: >50 WBC/hpf — ABNORMAL HIGH (ref 0–5)
pH: 6 (ref 5.0–8.0)

## 2019-06-10 NOTE — Patient Instructions (Signed)
Medication Instructions:  °Your physician recommends that you continue on your current medications as directed. Please refer to the Current Medication list given to you today. ° °*If you need a refill on your cardiac medications before your next appointment, please call your pharmacy* ° ° °Lab Work: °NONE  ° °If you have labs (blood work) drawn today and your tests are completely normal, you will receive your results only by: °MyChart Message (if you have MyChart) OR °A paper copy in the mail °If you have any lab test that is abnormal or we need to change your treatment, we will call you to review the results. ° ° °Testing/Procedures: °NONE  ° ° °Follow-Up: °At [facility], you and your health needs are our priority.  As part of our continuing mission to provide you with exceptional heart care, we have created designated Provider Care Teams.  These Care Teams include your primary Cardiologist (physician) and Advanced Practice Providers (APPs -  Physician Assistants and Nurse Practitioners) who all work together to provide you with the care you need, when you need it. ° °We recommend signing up for the patient portal called "MyChart".  Sign up information is provided on this After Visit Summary.  MyChart is used to connect with patients for Virtual Visits (Telemedicine).  Patients are able to view lab/test results, encounter notes, upcoming appointments, etc.  Non-urgent messages can be sent to your provider as well.   °To learn more about what you can do with MyChart, go to [email or web address].   ° °Your next appointment:   ° As Needed  ° °The format for your next appointment:   °In Person ° °Provider:   °Gregg Taylor, MD  ° ° °Other Instructions °Thank you for choosing  [facility]! °  ° ° °

## 2019-06-10 NOTE — Progress Notes (Signed)
Electrophysiology TeleHealth Note   Due to national recommendations of social distancing due to COVID 19, an audio/video telehealth visit is felt to be most appropriate for this patient at this time.  See MyChart message from today for the patient's consent to telehealth for [facility].   Date:  06/10/2019   ID:  Rebecca [name], DOB 1940/03/17, MRN [id number]  Location: patient's home  Provider location: [address] [place]  Evaluation Performed: Follow-up visit  PCP:  [name], MD  Cardiologist:  Prentice Docker, MD  Electrophysiologist:  Dr Ladona Ridgel  Chief Complaint:  "My grandma is in a bed and has hospice."  History of Present Illness:    Rebecca [name] is a 79 y.o. female who presents via audio/video conferencing for a telehealth visit today.  Since last being seen in our clinic, the patient has done very poorly. She is bed bound and has hospice. She has fairly severe dementia. She is in chronic pain and is on vicodin. She has chronic dyspnea and is weak. She requires around the clock care, being provided by her son, daughter, and grandaughter.   Past Medical History:  Diagnosis Date  . Aortic stenosis   . Cataplexy   . Diastolic heart failure (HCC)   . Essential hypertension   . GERD (gastroesophageal reflux disease)   . Hiatal hernia   . History of recurrent deep vein thrombosis (DVT)   . Mitral stenosis   . Narcolepsy   . Neuropathy   . Renal disorder   . Type 2 diabetes mellitus (HCC)     Past Surgical History:  Procedure Laterality Date  . ABDOMINAL HYSTERECTOMY    . CARPAL TUNNEL RELEASE    . CARPECTOMY  12/26/2011   Procedure: CARPECTOMY;  Surgeon: Wyn Forster., MD;  Location: [facility];  Service: Orthopedics;  Laterality: Right;  PROXIMAL ROW CARPECTOMY RIGHT WRIST and neurectomy  . CHOLECYSTECTOMY    . HEMORROIDECTOMY    . KIDNEY SURGERY    . NEPHROSTOMY    . PACEMAKER IMPLANT N/A 10/25/2016   Procedure: Pacemaker Implant;  Surgeon: Regan Lemming, MD;  Location: [facility];  Service: Cardiovascular;  Laterality: N/A;  . TOTAL HIP ARTHROPLASTY     bilateral    Current Outpatient Medications  Medication Sig Dispense Refill  . acetaminophen (TYLENOL) 650 MG CR tablet Take 1,300 mg by mouth in the morning and at bedtime.    . carvedilol (COREG) 3.125 MG tablet Take 1 tablet (3.125 mg total) by mouth 2 (two) times daily with a meal. 60 tablet 4  . Cholecalciferol (VITAMIN D3) 3000 UNITS TABS Take 1 capsule by mouth daily.    . clobetasol cream (TEMOVATE) 0.05 % Apply 1 application topically daily.     . clomiPRAMINE (ANAFRANIL) 25 MG capsule Take 1 capsule (25 mg total) by mouth 2 (two) times daily. (Patient taking differently: Take 25 mg by mouth in the morning, at noon, in the evening, and at bedtime. ) 60 capsule 2  . clonazePAM (KLONOPIN) 1 MG tablet Take 1 tablet (1 mg total) by mouth at bedtime.    . diclofenac Sodium (VOLTAREN) 1 % GEL Apply 1 application topically daily as needed.    . DULoxetine (CYMBALTA) 60 MG capsule Take 100 mg by mouth daily.     . ferrous sulfate 325 (65 FE) MG tablet Take 325 mg by mouth 2 (two) times daily with a meal.     .  furosemide (LASIX) 40 MG tablet Take 0.5 tablets (20 mg total) by mouth 2 (two) times daily. 60 tablet 5  . gabapentin (NEURONTIN) 300 MG capsule Take 300 mg by mouth 2 (two) times daily.    Marland Kitchen HYDROcodone-acetaminophen (NORCO/VICODIN) 5-325 MG tablet Take 1 tablet by mouth every 4 (four) hours as needed.    . insulin glargine (LANTUS) 100 unit/mL SOPN Inject 0.2 mLs (20 Units total) into the skin daily. (Patient taking differently: Inject 20-24 Units into the skin daily. ) 15 mL 11  . lidocaine (LIDODERM) 5 % Place 1 patch onto the skin daily. Remove & Discard patch within 12 hours or as directed by MD 30 patch 0  . magnesium oxide (MAG-OX) 400 MG tablet Take 400 mg by mouth daily.    . metFORMIN (GLUCOPHAGE) 500 MG  tablet Take 500 mg by mouth 2 (two) times daily with a meal.    . Multiple Vitamin (MULTIVITAMIN WITH MINERALS) TABS tablet Take 1 tablet by mouth daily.    . potassium chloride (KLOR-CON) 10 MEQ tablet Take 1 tablet (10 mEq total) by mouth daily. 90 tablet 3  . QUEtiapine (SEROQUEL) 100 MG tablet Take 100 mg by mouth at bedtime.    . TURMERIC PO Take 1 capsule by mouth daily.    . vitamin C (ASCORBIC ACID) 500 MG tablet Take 500 mg by mouth daily.    Marland Kitchen warfarin (COUMADIN) 5 MG tablet Take 2.5 mg by mouth daily.     Marland Kitchen zinc sulfate 220 (50 Zn) MG capsule Take 220 mg by mouth daily.     No current facility-administered medications for this visit.    Allergies:   Codeine, Compazine, Other, Penicillins, Morphine and related, and Demerol [meperidine]   Social History:  The patient  reports that she quit smoking about 30 years ago. She has never used smokeless tobacco. She reports that she does not drink alcohol or use drugs.   Family History:  The patient's  family history includes Diabetes in an other family member; Heart disease in her father; Scleroderma in her brother; Stroke in her mother.   ROS:  Please see the history of present illness.   All other systems are personally reviewed and negative.    Exam:    Vital Signs:  Ht 5\' 6"  (1.676 m)   BMI 32.17 kg/m    Labs/Other Tests and Data Reviewed:    Recent Labs: 09/04/2018: TSH 1.052 12/15/2018: Magnesium 1.8 01/01/2019: B Natriuretic Peptide 445.0 03/24/2019: ALT 17; BUN 30; Creatinine, Ser 1.03; Hemoglobin 12.1; Platelets 250; Potassium 4.3; Sodium 138   Wt Readings from Last 3 Encounters:  03/24/19 199 lb 4.7 oz (90.4 kg)  03/23/19 200 lb (90.7 kg)  01/20/19 230 lb (104.3 kg)     Other studies personally reviewed:  Last device remote is reviewed from [employer] PDF dated 3/21 which reveals normal device function, no arrhythmias    ASSESSMENT & PLAN:    1.  Atrial fib - her rates are controlled based on PM interrogation  2. PPM - her Medtronic DDD PM is working normally.  3. 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  Follow-up:  As needed Next remote: 621  Current medicines are reviewed at length with the patient today.   The patient does not have concerns regarding her medicines.  The following changes were made today:  none  Labs/ tests ordered today include: none No orders of the defined types were placed in this encounter.    Patient  Risk:  after full review of this patients clinical status, I feel that they are at moderate risk at this time.  Today, I have spent 15 minutes with the patient with telehealth technology discussing all of the above .    Signed, Cristopher Peru, MD  06/10/2019 11:21 AM     [address] [place] [address] [phone] (office) [phone] (fax)

## 2019-06-12 LAB — URINE CULTURE: Culture: >=100000 — AB

## 2019-06-29 ENCOUNTER — Other Ambulatory Visit: Payer: Self-pay

## 2019-06-29 ENCOUNTER — Encounter: Payer: Self-pay | Admitting: Student

## 2019-06-29 ENCOUNTER — Telehealth (INDEPENDENT_AMBULATORY_CARE_PROVIDER_SITE_OTHER): Admitting: Student

## 2019-06-29 VITALS — Ht 66.0 in

## 2019-06-29 DIAGNOSIS — I442 Atrioventricular block, complete: Secondary | ICD-10-CM | POA: Diagnosis not present

## 2019-06-29 DIAGNOSIS — Z86718 Personal history of other venous thrombosis and embolism: Secondary | ICD-10-CM

## 2019-06-29 DIAGNOSIS — I5042 Chronic combined systolic (congestive) and diastolic (congestive) heart failure: Secondary | ICD-10-CM | POA: Diagnosis not present

## 2019-06-29 DIAGNOSIS — I4819 Other persistent atrial fibrillation: Secondary | ICD-10-CM

## 2019-06-29 DIAGNOSIS — I38 Endocarditis, valve unspecified: Secondary | ICD-10-CM

## 2019-06-29 DIAGNOSIS — R300 Dysuria: Secondary | ICD-10-CM

## 2019-06-29 MED ORDER — CARVEDILOL 3.125 MG PO TABS: 3.1250 mg | ORAL_TABLET | Freq: Two times a day (BID) | ORAL | 3 refills | Status: AC

## 2019-06-29 MED ORDER — FUROSEMIDE 20 MG PO TABS: 20.0000 mg | ORAL_TABLET | Freq: Two times a day (BID) | ORAL | 3 refills | Status: AC

## 2019-06-29 NOTE — Progress Notes (Signed)
Virtual Visit via Telephone Note   This visit type was conducted due to national recommendations for restrictions regarding the COVID-19 Pandemic (e.g. social distancing) in an effort to limit this patient's exposure and mitigate transmission in our community.  Due to her co-morbid illnesses, this patient is at least at moderate risk for complications without adequate follow up.  This format is felt to be most appropriate for this patient at this time.  The patient did not have access to video technology/had technical difficulties with video requiring transitioning to audio format only (telephone).  All issues noted in this document were discussed and addressed.  No physical exam could be performed with this format.  Please refer to the patient's chart for her  consent to telehealth for Rebecca [name].   The patient was identified using 2 identifiers.  Date:  06/29/2019   ID:  Rebecca [name], DOB 16-Jan-1941, MRN [id number]  Patient Location: Home Provider Location: Office  PCP:  [name], MD  Cardiologist:  Prentice Docker, MD Electrophysiologist: Dr. Ladona Ridgel  Evaluation Performed:  Follow-Up Visit  Chief Complaint: 47-month visit  History of Present Illness:    Rebecca [name] is a 79 y.o. female with past medical history of chronic combined systolic and diastolic CHF (EF 80-99% by echo in 12/2018), CHB (s/p Medtronic PPM placement in 2018), persistent atrial fibrillation, recurrent DVT, HTN, HLD, Type 2 DM, dementia, moderate AS and moderate to severe MS who presents for a 28-month follow-up telehealth visit.   She had a phone visit with Dr. Purvis Sheffield in 03/2019 and denied any chest pain or palpitations but was experiencing worsening back pain and weakness. No changes were made to her medication regimen at that time. She had another phone visit with Dr. Ladona Ridgel on 06/10/2019 and family members reported she was mostly bed bound and was being followed by Hospice. Recent device  interrogation did show normal device function with persistent atrial fibrillation.   Most history is provided by her granddaughter, [name] who stays with the patient throughout the week. She is bedbound at this point and is being followed by Hospice. Their goal as a family is to keep her at home if possible.  Her granddaughter reports her food and liquid intake is variable day by day. They give her Lasix on an as-needed basis and are aware not to administer this on days she has poor PO intake.  She does have some coughing intermittently throughout the day and night. They are unable to obtain daily weights on her.  She denies any recent specific chest pain or palpitations but typically reports "hurting all over".  Her granddaughter mentions she had a UTI earlier this month and was treated with antibiotic therapy but continues to have dysuria and reports her "kidneys hurt".  The patient does not have symptoms concerning for COVID-19 infection (fever, chills, cough, or new shortness of breath).    Past Medical History:  Diagnosis Date  . Aortic stenosis   . Cataplexy   . Diastolic heart failure (HCC)   . Essential hypertension   . GERD (gastroesophageal reflux disease)   . Hiatal hernia   . History of recurrent deep vein thrombosis (DVT)   . Mitral stenosis   . Narcolepsy   . Neuropathy   . Renal disorder   . Type 2 diabetes mellitus (HCC)    Past Surgical History:  Procedure Laterality Date  . ABDOMINAL HYSTERECTOMY    . CARPAL TUNNEL RELEASE    . CARPECTOMY  12/26/2011  Procedure: CARPECTOMY;  Surgeon: Cammie Sickle., MD;  Location: [facility];  Service: Orthopedics;  Laterality: Right;  PROXIMAL ROW CARPECTOMY RIGHT WRIST and neurectomy  . CHOLECYSTECTOMY    . HEMORROIDECTOMY    . KIDNEY SURGERY    . NEPHROSTOMY    . PACEMAKER IMPLANT N/A 10/25/2016   Procedure: Pacemaker Implant;  Surgeon: Constance Haw, MD;  Location: [facility];  Service:  Cardiovascular;  Laterality: N/A;  . TOTAL HIP ARTHROPLASTY     bilateral     Current Meds  Medication Sig  . carvedilol (COREG) 3.125 MG tablet Take 1 tablet (3.125 mg total) by mouth 2 (two) times daily with a meal.  . Cholecalciferol (VITAMIN D3) 3000 UNITS TABS Take 1 capsule by mouth daily.  . clobetasol cream (TEMOVATE) 6.38 % Apply 1 application topically daily.   . clomiPRAMINE (ANAFRANIL) 25 MG capsule Take 1 capsule (25 mg total) by mouth 2 (two) times daily. (Patient taking differently: Take 25 mg by mouth in the morning, at noon, in the evening, and at bedtime. )  . clonazePAM (KLONOPIN) 1 MG tablet Take 1 tablet (1 mg total) by mouth at bedtime.  . DULoxetine (CYMBALTA) 60 MG capsule Take 100 mg by mouth daily.   . ferrous sulfate 325 (65 FE) MG tablet Take 325 mg by mouth 2 (two) times daily with a meal.   . furosemide (LASIX) 20 MG tablet Take 1 tablet (20 mg total) by mouth 2 (two) times daily.  Marland Kitchen gabapentin (NEURONTIN) 300 MG capsule Take 300 mg by mouth 2 (two) times daily.  . insulin glargine (LANTUS) 100 unit/mL SOPN Inject 0.2 mLs (20 Units total) into the skin daily. (Patient taking differently: Inject 20-24 Units into the skin daily. )  . lidocaine (LIDODERM) 5 % Place 1 patch onto the skin daily. Remove & Discard patch within 12 hours or as directed by MD  . magnesium oxide (MAG-OX) 400 MG tablet Take 400 mg by mouth daily.  . metFORMIN (GLUCOPHAGE) 500 MG tablet Take 500 mg by mouth 2 (two) times daily with a meal.  . Morphine Sulfate (MORPHINE CONCENTRATE) 10 mg / 0.5 ml concentrated solution Take by mouth.  . potassium chloride (KLOR-CON) 10 MEQ tablet Take 1 tablet (10 mEq total) by mouth daily.  . QUEtiapine (SEROQUEL) 100 MG tablet Take 100 mg by mouth at bedtime.  Marland Kitchen warfarin (COUMADIN) 5 MG tablet Take 2.5 mg by mouth daily.   . [DISCONTINUED] carvedilol (COREG) 3.125 MG tablet Take 1 tablet (3.125 mg total) by mouth 2 (two) times daily with a meal.  .  [DISCONTINUED] furosemide (LASIX) 40 MG tablet Take 0.5 tablets (20 mg total) by mouth 2 (two) times daily.     Allergies:   Codeine, Compazine, Other, Penicillins, Morphine and related, and Demerol [meperidine]   Social History   Tobacco Use  . Smoking status: Former Smoker    Quit date: 10/13/1988    Years since quitting: 30.7  . Smokeless tobacco: Never Used  Substance Use Topics  . Alcohol use: No    Alcohol/week: 0.0 standard drinks  . Drug use: No     Family Hx: The patient's family history includes Diabetes in an other family member; Heart disease in her father; Scleroderma in her brother; Stroke in her mother.  ROS:   Please see the history of present illness.     All other systems reviewed and are negative.   Prior CV studies:   The following studies  were reviewed today:  Limited Echocardiogram: 12/2018 IMPRESSIONS    1. Left ventricular ejection fraction, by visual estimation, is 45 to  50%. The left ventricle has mildly decreased function. There is mildly  increased left ventricular hypertrophy.  2. Entire apex, mid and apical inferior septum, mid anteroseptal segment,  and basal inferolateral segment are abnormal.  3. Left ventricular diastolic parameters are indeterminate.  4. Global right ventricle has mildly reduced systolic function.The right  ventricular size is normal. No increase in right ventricular wall  thickness.  5. Left atrial size was severely dilated.  6. Right atrial size was moderately dilated.  7. Severe mitral annular calcification.  8. Severe thickening of the mitral valve leaflet(s).  9. The mitral valve is abnormal. Trace mitral valve regurgitation. Severe  mitral stenosis. Mean gradient 10 mmHg.  10. The tricuspid valve is grossly normal. Tricuspid valve regurgitation  is mild.  11. Aortic valve mean gradient measures 13.0 mmHg.  12. The aortic valve is tricuspid. Aortic valve regurgitation is mild.  Leaflets are  moderately calcified and cusp excursion is moderately  reduced. Although not fully interrogated, suspect at least moderate aortic  stenosis.  13. The pulmonic valve was grossly normal. Pulmonic valve regurgitation is  trivial.  14. TR signal is inadequate for assessing pulmonary artery systolic  pressure.  15. A pacer wire is visualized.  16. The inferior vena cava is normal in size with greater than 50%  respiratory variability, suggesting right atrial pressure of 3 mmHg.   Labs/Other Tests and Data Reviewed:    EKG:  No ECG reviewed.  Recent Labs: 09/04/2018: TSH 1.052 12/15/2018: Magnesium 1.8 01/01/2019: B Natriuretic Peptide 445.0 03/24/2019: ALT 17; BUN 30; Creatinine, Ser 1.03; Hemoglobin 12.1; Platelets 250; Potassium 4.3; Sodium 138   Recent Lipid Panel Lab Results  Component Value Date/Time   TRIG 75 12/08/2018 09:13 PM    Wt Readings from Last 3 Encounters:  03/24/19 199 lb 4.7 oz (90.4 kg)  03/23/19 200 lb (90.7 kg)  01/20/19 230 lb (104.3 kg)     Objective:    Vital Signs:  Ht 5\' 6"  (1.676 m)   BMI 32.17 kg/m    Physical Exam not performed. Virtual Phone visit.   ASSESSMENT & PLAN:    1. Chronic Combined Systolic and Diastolic CHF - she has a known mildly reduced EF of 45-50% by echo in 12/2018. She has experienced variable PO intake in the setting of her dementia and declining status, therefore she takes Lasix 20 mg as needed.   2. CHB - s/p Medtronic PPM placement in 2018. Her device has been followed by Dr. 2019 and most recent interrogation showed normal device function as outlined above.  3. Persistent Atrial Fibrillation - She has not reported any recent palpitations and remains on Coreg 3.125 mg twice daily. She is on Coumadin for anticoagulation in regards to her atrial fibrillation and also due to recurrent DVT's. Coumadin was previously followed by her PCP. She has remained on Coumadin per the patient's family members report and they are  unaware of any recent blood work. Given her bedbound status, will see if an INR can be checked by Hospice and would recommend continuing to address risks and benefits of anticoagulation in the setting of being on Hospice care as this should likely be discontinued in the near future if family is in agreement.   4. Recurrent DVT's - She has remained on Coumadin for anticoagulation and this was previously followed by her PCP as outlined  above.   5. Valvular Heart Disease - Most recent echocardiogram showed severe mitral stenosis and moderate aortic stenosis.  She preferred conservative management in the past and is now on Hospice therapy due to her declining physical health and dementia.  6. Dysuria - She continues to report dysuria and lower back pain. Was treated for a UTI earlier this month and the family is requesting a repeat UA. Will forward today's note to Hospice for review.   COVID-19 Education: The signs and symptoms of COVID-19 were discussed with the patient and how to seek care for testing (follow up with PCP or arrange E-visit). The importance of social distancing was discussed today.  Time:   Today, I have spent 18 minutes with the patient and her family with telehealth technology discussing the above problems.     Medication Adjustments/Labs and Tests Ordered: Current medicines are reviewed at length with the patient today.  Concerns regarding medicines are outlined above.   Tests Ordered: No orders of the defined types were placed in this encounter.   Medication Changes: Meds ordered this encounter  Medications  . carvedilol (COREG) 3.125 MG tablet    Sig: Take 1 tablet (3.125 mg total) by mouth 2 (two) times daily with a meal.    Dispense:  180 tablet    Refill:  3    Order Specific Question:   Supervising Provider    Answer:   Lars Masson [id number]  . furosemide (LASIX) 20 MG tablet    Sig: Take 1 tablet (20 mg total) by mouth 2 (two) times daily.     Dispense:  180 tablet    Refill:  3    Order Specific Question:   Supervising Provider    Answer:   Lars Masson [[id number]]    Follow Up:  Virtual Visit  in 6 month(s)  Signed, Ellsworth Lennox, PA-C  06/29/2019 5:13 PM     [facility]

## 2019-06-29 NOTE — Patient Instructions (Addendum)
Medication Instructions:  Your physician recommends that you continue on your current medications as directed. Please refer to the Current Medication list given to you today.  *If you need a refill on your cardiac medications before your next appointment, please call your pharmacy*   Lab Work: Your physician recommends that you return for lab work in: UA, Urine culture, BMET  If you have labs (blood work) drawn today and your tests are completely normal, you will receive your results only by: Marland Kitchen MyChart Message (if you have MyChart) OR . A paper copy in the mail If you have any lab test that is abnormal or we need to change your treatment, we will call you to review the results.   Testing/Procedures: NONE   Follow-Up: At [facility], you and your health needs are our priority.  As part of our continuing mission to provide you with exceptional heart care, we have created designated Provider Care Teams.  These Care Teams include your primary Cardiologist (physician) and Advanced Practice Providers (APPs -  Physician Assistants and Nurse Practitioners) who all work together to provide you with the care you need, when you need it.  We recommend signing up for the patient portal called "MyChart".  Sign up information is provided on this After Visit Summary.  MyChart is used to connect with patients for Virtual Visits (Telemedicine).  Patients are able to view lab/test results, encounter notes, upcoming appointments, etc.  Non-urgent messages can be sent to your provider as well.   To learn more about what you can do with MyChart, go to [email or web address].    Your next appointment:   6 month(s)  The format for your next appointment:   Virtual Visit   Provider:   Prentice Docker, MD   Other Instructions Thank you for choosing [facility]!

## 2019-06-30 ENCOUNTER — Telehealth: Payer: Self-pay | Admitting: Cardiovascular Disease

## 2019-06-30 NOTE — Telephone Encounter (Signed)
Rebecca [name] w/ Hospice called asking Rebecca [name] to please give her a call concerning urine test orders [phone]

## 2019-07-01 NOTE — Telephone Encounter (Signed)
Rebecca [name] spoke with nurse on 06/30/19

## 2019-07-02 ENCOUNTER — Other Ambulatory Visit (HOSPITAL_COMMUNITY)
Admission: RE | Admit: 2019-07-02 | Discharge: 2019-07-02 | Disposition: A | Discharge status: Home / Self Care | Source: Hospice | Attending: Student | Admitting: Student

## 2019-07-02 DIAGNOSIS — N390 Urinary tract infection, site not specified: Secondary | ICD-10-CM | POA: Insufficient documentation

## 2019-07-02 LAB — URINALYSIS, ROUTINE W REFLEX MICROSCOPIC
Bilirubin Urine: NEGATIVE
Glucose, UA: NEGATIVE mg/dL
Ketones, ur: 5 mg/dL — AB
Nitrite: NEGATIVE
Protein, ur: 100 mg/dL — AB
Specific Gravity, Urine: 1.016 (ref 1.005–1.030)
WBC, UA: >50 WBC/hpf — ABNORMAL HIGH (ref 0–5)
pH: 6 (ref 5.0–8.0)

## 2019-07-05 ENCOUNTER — Other Ambulatory Visit: Payer: Self-pay | Admitting: Student

## 2019-07-05 LAB — URINE CULTURE: Culture: >=100000 — AB

## 2019-07-05 MED ORDER — SULFAMETHOXAZOLE-TRIMETHOPRIM 800-160 MG PO TABS: 1.0000 | ORAL_TABLET | Freq: Two times a day (BID) | ORAL | 0 refills | Status: AC

## 2019-07-07 ENCOUNTER — Telehealth: Payer: Self-pay

## 2019-07-07 NOTE — Telephone Encounter (Signed)
  Patient Consent for Virtual Visit         Rebecca [name] has provided verbal consent on 07/07/2019 for a virtual visit (video or telephone).   CONSENT FOR VIRTUAL VISIT FOR:  Rebecca [name]  By participating in this virtual visit I agree to the following:  I hereby voluntarily request, consent and authorize [facility] and its employed or contracted physicians, Producer, television/film/video, nurse practitioners or other licensed health care professionals (the Practitioner), to provide me with telemedicine health care services (the "Services") as deemed necessary by the treating Practitioner. I acknowledge and consent to receive the Services by the Practitioner via telemedicine. I understand that the telemedicine visit will involve communicating with the Practitioner through live audiovisual communication technology and the disclosure of certain medical information by electronic transmission. I acknowledge that I have been given the opportunity to request an in-person assessment or other available alternative prior to the telemedicine visit and am voluntarily participating in the telemedicine visit.  I understand that I have the right to withhold or withdraw my consent to the use of telemedicine in the course of my care at any time, without affecting my right to future care or treatment, and that the Practitioner or I may terminate the telemedicine visit at any time. I understand that I have the right to inspect all information obtained and/or recorded in the course of the telemedicine visit and may receive copies of available information for a reasonable fee.  I understand that some of the potential risks of receiving the Services via telemedicine include:  Marland Kitchen Delay or interruption in medical evaluation due to technological equipment failure or disruption; . Information transmitted may not be sufficient (e.g. poor resolution of images) to allow for appropriate medical decision making by the Practitioner;  and/or  . In rare instances, security protocols could fail, causing a breach of personal health information.  Furthermore, I acknowledge that it is my responsibility to provide information about my medical history, conditions and care that is complete and accurate to the best of my ability. I acknowledge that Practitioner's advice, recommendations, and/or decision may be based on factors not within their control, such as incomplete or inaccurate data provided by me or distortions of diagnostic images or specimens that may result from electronic transmissions. I understand that the practice of medicine is not an exact science and that Practitioner makes no warranties or guarantees regarding treatment outcomes. I acknowledge that a copy of this consent can be made available to me via my patient portal [facility] MyChart), or I can request a printed copy by calling the office of [facility].    I understand that my insurance will be billed for this visit.   I have read or had this consent read to me. . I understand the contents of this consent, which adequately explains the benefits and risks of the Services being provided via telemedicine.  . I have been provided ample opportunity to ask questions regarding this consent and the Services and have had my questions answered to my satisfaction. . I give my informed consent for the services to be provided through the use of telemedicine in my medical care

## 2019-07-08 ENCOUNTER — Other Ambulatory Visit (HOSPITAL_COMMUNITY)
Admission: RE | Admit: 2019-07-08 | Discharge: 2019-07-08 | Disposition: A | Discharge status: Home / Self Care | Payer: Medicare Other | Source: Other Acute Inpatient Hospital | Attending: Otolaryngology | Admitting: Otolaryngology

## 2019-07-08 DIAGNOSIS — E0822 Diabetes mellitus due to underlying condition with diabetic chronic kidney disease: Secondary | ICD-10-CM | POA: Insufficient documentation

## 2019-07-08 LAB — BASIC METABOLIC PANEL
Anion gap: 12 (ref 5–15)
BUN: 26 mg/dL — ABNORMAL HIGH (ref 8–23)
CO2: 29 mmol/L (ref 22–32)
Calcium: 9.3 mg/dL (ref 8.9–10.3)
Chloride: 92 mmol/L — ABNORMAL LOW (ref 98–111)
Creatinine, Ser: 0.89 mg/dL (ref 0.44–1.00)
GFR calc Af Amer: >60 mL/min (ref >60)
GFR calc non Af Amer: >60 mL/min (ref >60)
Glucose, Bld: 150 mg/dL — ABNORMAL HIGH (ref 70–99)
Potassium: 4.7 mmol/L (ref 3.5–5.1)
Sodium: 133 mmol/L — ABNORMAL LOW (ref 135–145)

## 2019-07-19 ENCOUNTER — Inpatient Hospital Stay (HOSPITAL_COMMUNITY)
Admission: AD | Admit: 2019-07-19 | Discharge: 2019-07-22 | DRG: 372 | Disposition: A | Discharge status: Hospice | Payer: Medicare Other | Attending: Internal Medicine | Admitting: Internal Medicine

## 2019-07-19 ENCOUNTER — Other Ambulatory Visit: Payer: Self-pay

## 2019-07-19 ENCOUNTER — Encounter (HOSPITAL_COMMUNITY): Payer: Self-pay

## 2019-07-19 ENCOUNTER — Emergency Department (HOSPITAL_COMMUNITY): Payer: Medicare Other

## 2019-07-19 DIAGNOSIS — R319 Hematuria, unspecified: Secondary | ICD-10-CM | POA: Diagnosis present

## 2019-07-19 DIAGNOSIS — Z833 Family history of diabetes mellitus: Secondary | ICD-10-CM

## 2019-07-19 DIAGNOSIS — Z950 Presence of cardiac pacemaker: Secondary | ICD-10-CM

## 2019-07-19 DIAGNOSIS — I480 Paroxysmal atrial fibrillation: Secondary | ICD-10-CM | POA: Diagnosis present

## 2019-07-19 DIAGNOSIS — Z888 Allergy status to other drugs, medicaments and biological substances status: Secondary | ICD-10-CM

## 2019-07-19 DIAGNOSIS — F329 Major depressive disorder, single episode, unspecified: Secondary | ICD-10-CM | POA: Diagnosis present

## 2019-07-19 DIAGNOSIS — N1831 Chronic kidney disease, stage 3a: Secondary | ICD-10-CM | POA: Diagnosis present

## 2019-07-19 DIAGNOSIS — R823 Hemoglobinuria: Secondary | ICD-10-CM | POA: Diagnosis present

## 2019-07-19 DIAGNOSIS — R197 Diarrhea, unspecified: Secondary | ICD-10-CM | POA: Diagnosis not present

## 2019-07-19 DIAGNOSIS — K219 Gastro-esophageal reflux disease without esophagitis: Secondary | ICD-10-CM | POA: Diagnosis present

## 2019-07-19 DIAGNOSIS — Z96643 Presence of artificial hip joint, bilateral: Secondary | ICD-10-CM | POA: Diagnosis present

## 2019-07-19 DIAGNOSIS — Z20822 Contact with and (suspected) exposure to covid-19: Secondary | ICD-10-CM | POA: Diagnosis present

## 2019-07-19 DIAGNOSIS — I442 Atrioventricular block, complete: Secondary | ICD-10-CM | POA: Diagnosis present

## 2019-07-19 DIAGNOSIS — I130 Hypertensive heart and chronic kidney disease with heart failure and stage 1 through stage 4 chronic kidney disease, or unspecified chronic kidney disease: Secondary | ICD-10-CM | POA: Diagnosis present

## 2019-07-19 DIAGNOSIS — E871 Hypo-osmolality and hyponatremia: Secondary | ICD-10-CM | POA: Diagnosis present

## 2019-07-19 DIAGNOSIS — Z86718 Personal history of other venous thrombosis and embolism: Secondary | ICD-10-CM

## 2019-07-19 DIAGNOSIS — Z87891 Personal history of nicotine dependence: Secondary | ICD-10-CM

## 2019-07-19 DIAGNOSIS — E1122 Type 2 diabetes mellitus with diabetic chronic kidney disease: Secondary | ICD-10-CM | POA: Diagnosis present

## 2019-07-19 DIAGNOSIS — I5042 Chronic combined systolic (congestive) and diastolic (congestive) heart failure: Secondary | ICD-10-CM | POA: Diagnosis present

## 2019-07-19 DIAGNOSIS — Z6832 Body mass index (BMI) 32.0-32.9, adult: Secondary | ICD-10-CM

## 2019-07-19 DIAGNOSIS — G47411 Narcolepsy with cataplexy: Secondary | ICD-10-CM | POA: Diagnosis present

## 2019-07-19 DIAGNOSIS — Z8249 Family history of ischemic heart disease and other diseases of the circulatory system: Secondary | ICD-10-CM

## 2019-07-19 DIAGNOSIS — J9611 Chronic respiratory failure with hypoxia: Secondary | ICD-10-CM | POA: Diagnosis present

## 2019-07-19 DIAGNOSIS — Z794 Long term (current) use of insulin: Secondary | ICD-10-CM

## 2019-07-19 DIAGNOSIS — Z885 Allergy status to narcotic agent status: Secondary | ICD-10-CM

## 2019-07-19 DIAGNOSIS — R410 Disorientation, unspecified: Secondary | ICD-10-CM

## 2019-07-19 DIAGNOSIS — Z7401 Bed confinement status: Secondary | ICD-10-CM

## 2019-07-19 DIAGNOSIS — A0472 Enterocolitis due to Clostridium difficile, not specified as recurrent: Secondary | ICD-10-CM | POA: Diagnosis not present

## 2019-07-19 DIAGNOSIS — Z66 Do not resuscitate: Secondary | ICD-10-CM | POA: Diagnosis present

## 2019-07-19 DIAGNOSIS — E119 Type 2 diabetes mellitus without complications: Secondary | ICD-10-CM

## 2019-07-19 DIAGNOSIS — I083 Combined rheumatic disorders of mitral, aortic and tricuspid valves: Secondary | ICD-10-CM | POA: Diagnosis present

## 2019-07-19 DIAGNOSIS — Z880 Allergy status to penicillin: Secondary | ICD-10-CM

## 2019-07-19 DIAGNOSIS — Z79899 Other long term (current) drug therapy: Secondary | ICD-10-CM

## 2019-07-19 DIAGNOSIS — N390 Urinary tract infection, site not specified: Secondary | ICD-10-CM | POA: Diagnosis not present

## 2019-07-19 DIAGNOSIS — R627 Adult failure to thrive: Secondary | ICD-10-CM | POA: Diagnosis present

## 2019-07-19 DIAGNOSIS — N183 Chronic kidney disease, stage 3 unspecified: Secondary | ICD-10-CM | POA: Diagnosis present

## 2019-07-19 DIAGNOSIS — F32A Depression, unspecified: Secondary | ICD-10-CM | POA: Diagnosis present

## 2019-07-19 DIAGNOSIS — I10 Essential (primary) hypertension: Secondary | ICD-10-CM | POA: Diagnosis present

## 2019-07-19 DIAGNOSIS — B9620 Unspecified Escherichia coli [E. coli] as the cause of diseases classified elsewhere: Secondary | ICD-10-CM | POA: Diagnosis present

## 2019-07-19 DIAGNOSIS — E1165 Type 2 diabetes mellitus with hyperglycemia: Secondary | ICD-10-CM | POA: Diagnosis present

## 2019-07-19 LAB — CBC WITH DIFFERENTIAL/PLATELET
Abs Immature Granulocytes: 0.04 10*3/uL (ref 0.00–0.07)
Basophils Absolute: 0.1 10*3/uL (ref 0.0–0.1)
Basophils Relative: 0 %
Eosinophils Absolute: 0.3 10*3/uL (ref 0.0–0.5)
Eosinophils Relative: 2 %
HCT: 38.3 % (ref 36.0–46.0)
Hemoglobin: 12.3 g/dL (ref 12.0–15.0)
Immature Granulocytes: 0 %
Lymphocytes Relative: 11 %
Lymphs Abs: 1.5 10*3/uL (ref 0.7–4.0)
MCH: 29.5 pg (ref 26.0–34.0)
MCHC: 32.1 g/dL (ref 30.0–36.0)
MCV: 91.8 fL (ref 80.0–100.0)
Monocytes Absolute: 0.9 10*3/uL (ref 0.1–1.0)
Monocytes Relative: 7 %
Neutro Abs: 11.1 10*3/uL — ABNORMAL HIGH (ref 1.7–7.7)
Neutrophils Relative %: 80 %
Platelets: 351 10*3/uL (ref 150–400)
RBC: 4.17 MIL/uL (ref 3.87–5.11)
RDW: 16.2 % — ABNORMAL HIGH (ref 11.5–15.5)
WBC: 14 10*3/uL — ABNORMAL HIGH (ref 4.0–10.5)
nRBC: 0 % (ref 0.0–0.2)

## 2019-07-19 LAB — BLOOD GAS, ARTERIAL
Acid-Base Excess: 7.1 mmol/L — ABNORMAL HIGH (ref 0.0–2.0)
Acid-Base Excess: 6.8 mmol/L — ABNORMAL HIGH (ref 0.0–2.0)
Bicarbonate: 30.4 mmol/L — ABNORMAL HIGH (ref 20.0–28.0)
Bicarbonate: 30.4 mmol/L — ABNORMAL HIGH (ref 20.0–28.0)
FIO2: 21
FIO2: 28
O2 Saturation: 86.5 %
O2 Saturation: 96.6 %
Patient temperature: 36.6
Patient temperature: 36.5
pCO2 arterial: 43.6 mmHg (ref 32.0–48.0)
pCO2 arterial: 45.3 mmHg (ref 32.0–48.0)
pH, Arterial: 7.466 — ABNORMAL HIGH (ref 7.350–7.450)
pH, Arterial: 7.45 (ref 7.350–7.450)
pO2, Arterial: 53.6 mmHg — ABNORMAL LOW (ref 83.0–108.0)
pO2, Arterial: 87.5 mmHg (ref 83.0–108.0)

## 2019-07-19 LAB — MAGNESIUM: Magnesium: 2 mg/dL (ref 1.7–2.4)

## 2019-07-19 LAB — COMPREHENSIVE METABOLIC PANEL
ALT: 18 U/L (ref 0–44)
AST: 20 U/L (ref 15–41)
Albumin: 2.7 g/dL — ABNORMAL LOW (ref 3.5–5.0)
Alkaline Phosphatase: 55 U/L (ref 38–126)
Anion gap: 11 (ref 5–15)
BUN: 28 mg/dL — ABNORMAL HIGH (ref 8–23)
CO2: 29 mmol/L (ref 22–32)
Calcium: 9.1 mg/dL (ref 8.9–10.3)
Chloride: 92 mmol/L — ABNORMAL LOW (ref 98–111)
Creatinine, Ser: 0.81 mg/dL (ref 0.44–1.00)
GFR calc Af Amer: >60 mL/min (ref >60)
GFR calc non Af Amer: >60 mL/min (ref >60)
Glucose, Bld: 201 mg/dL — ABNORMAL HIGH (ref 70–99)
Potassium: 4.2 mmol/L (ref 3.5–5.1)
Sodium: 132 mmol/L — ABNORMAL LOW (ref 135–145)
Total Bilirubin: 0.8 mg/dL (ref 0.3–1.2)
Total Protein: 6.9 g/dL (ref 6.5–8.1)

## 2019-07-19 LAB — PROTIME-INR
INR: 1.2 (ref 0.8–1.2)
Prothrombin Time: 15.2 seconds (ref 11.4–15.2)

## 2019-07-19 LAB — SARS CORONAVIRUS 2 BY RT PCR (HOSPITAL ORDER, PERFORMED IN ~~LOC~~ HOSPITAL LAB): SARS Coronavirus 2: NEGATIVE

## 2019-07-19 LAB — URINALYSIS, ROUTINE W REFLEX MICROSCOPIC
Bilirubin Urine: NEGATIVE
Glucose, UA: NEGATIVE mg/dL
Ketones, ur: NEGATIVE mg/dL
Nitrite: NEGATIVE
Protein, ur: 100 mg/dL — AB
RBC / HPF: >50 RBC/hpf — ABNORMAL HIGH (ref 0–5)
Specific Gravity, Urine: 1.021 (ref 1.005–1.030)
WBC, UA: >50 WBC/hpf — ABNORMAL HIGH (ref 0–5)
pH: 5 (ref 5.0–8.0)

## 2019-07-19 LAB — GLUCOSE, CAPILLARY: Glucose-Capillary: 156 mg/dL — ABNORMAL HIGH (ref 70–99)

## 2019-07-19 LAB — LIPASE, BLOOD: Lipase: 15 U/L (ref 11–51)

## 2019-07-19 MED ORDER — ACETAMINOPHEN 325 MG PO TABS
650.0000 mg | ORAL_TABLET | Freq: Four times a day (QID) | ORAL | Status: DC | PRN
  Administered 2019-07-19 – 2019-07-20 (×2): 650 mg via ORAL
  Filled 2019-07-19 (×2): qty 2

## 2019-07-19 MED ORDER — WARFARIN - PHARMACIST DOSING INPATIENT: Freq: Every day | Status: DC

## 2019-07-19 MED ORDER — CLONAZEPAM 0.5 MG PO TABS
1.0000 mg | ORAL_TABLET | Freq: Every day | ORAL | Status: DC
  Administered 2019-07-19 – 2019-07-21 (×3): 1 mg via ORAL
  Filled 2019-07-19 (×3): qty 2

## 2019-07-19 MED ORDER — ONDANSETRON HCL 4 MG PO TABS: 4.0000 mg | ORAL_TABLET | Freq: Four times a day (QID) | ORAL | Status: DC | PRN

## 2019-07-19 MED ORDER — INSULIN ASPART 100 UNIT/ML ~~LOC~~ SOLN
0.0000 [IU] | Freq: Three times a day (TID) | SUBCUTANEOUS | Status: DC
  Administered 2019-07-20: 2 [IU] via SUBCUTANEOUS
  Administered 2019-07-20: 3 [IU] via SUBCUTANEOUS
  Administered 2019-07-20 – 2019-07-22 (×5): 2 [IU] via SUBCUTANEOUS

## 2019-07-19 MED ORDER — CIPROFLOXACIN IN D5W 400 MG/200ML IV SOLN
400.0000 mg | Freq: Two times a day (BID) | INTRAVENOUS | Status: DC
  Filled 2019-07-19: qty 200

## 2019-07-19 MED ORDER — CARVEDILOL 3.125 MG PO TABS
3.1250 mg | ORAL_TABLET | Freq: Two times a day (BID) | ORAL | Status: DC
  Administered 2019-07-20 – 2019-07-22 (×4): 3.125 mg via ORAL
  Filled 2019-07-19 (×5): qty 1

## 2019-07-19 MED ORDER — FERROUS SULFATE 325 (65 FE) MG PO TABS
325.0000 mg | ORAL_TABLET | Freq: Two times a day (BID) | ORAL | Status: DC
  Administered 2019-07-20 – 2019-07-22 (×5): 325 mg via ORAL
  Filled 2019-07-19 (×5): qty 1

## 2019-07-19 MED ORDER — ACETAMINOPHEN 650 MG RE SUPP: 650.0000 mg | Freq: Four times a day (QID) | RECTAL | Status: DC | PRN

## 2019-07-19 MED ORDER — DULOXETINE HCL 60 MG PO CPEP
100.0000 mg | ORAL_CAPSULE | Freq: Every day | ORAL | Status: DC
  Administered 2019-07-20 – 2019-07-22 (×3): 100 mg via ORAL
  Filled 2019-07-19 (×3): qty 2

## 2019-07-19 MED ORDER — POTASSIUM CHLORIDE CRYS ER 10 MEQ PO TBCR
10.0000 meq | EXTENDED_RELEASE_TABLET | Freq: Every day | ORAL | Status: DC
  Administered 2019-07-20 – 2019-07-22 (×3): 10 meq via ORAL
  Filled 2019-07-19 (×3): qty 1

## 2019-07-19 MED ORDER — MAGNESIUM OXIDE 400 (241.3 MG) MG PO TABS
400.0000 mg | ORAL_TABLET | Freq: Every day | ORAL | Status: DC
  Administered 2019-07-20 – 2019-07-22 (×3): 400 mg via ORAL
  Filled 2019-07-19 (×3): qty 1

## 2019-07-19 MED ORDER — METFORMIN HCL 500 MG PO TABS
500.0000 mg | ORAL_TABLET | Freq: Two times a day (BID) | ORAL | Status: DC
  Filled 2019-07-19: qty 1

## 2019-07-19 MED ORDER — INSULIN GLARGINE 100 UNIT/ML ~~LOC~~ SOLN
20.0000 [IU] | Freq: Every day | SUBCUTANEOUS | Status: DC
  Filled 2019-07-19 (×2): qty 0.2

## 2019-07-19 MED ORDER — ONDANSETRON HCL 4 MG/2ML IJ SOLN: 4.0000 mg | Freq: Four times a day (QID) | INTRAMUSCULAR | Status: DC | PRN

## 2019-07-19 MED ORDER — GABAPENTIN 300 MG PO CAPS
300.0000 mg | ORAL_CAPSULE | Freq: Two times a day (BID) | ORAL | Status: DC
  Administered 2019-07-19 – 2019-07-22 (×6): 300 mg via ORAL
  Filled 2019-07-19 (×6): qty 1

## 2019-07-19 MED ORDER — QUETIAPINE FUMARATE 100 MG PO TABS
100.0000 mg | ORAL_TABLET | Freq: Every day | ORAL | Status: DC
  Administered 2019-07-19 – 2019-07-21 (×3): 100 mg via ORAL
  Filled 2019-07-19 (×2): qty 1
  Filled 2019-07-19: qty 4

## 2019-07-19 MED ORDER — CIPROFLOXACIN IN D5W 400 MG/200ML IV SOLN
400.0000 mg | Freq: Once | INTRAVENOUS | Status: AC
  Administered 2019-07-19: 400 mg via INTRAVENOUS
  Filled 2019-07-19: qty 200

## 2019-07-19 MED ORDER — WARFARIN SODIUM 2.5 MG PO TABS
2.5000 mg | ORAL_TABLET | Freq: Once | ORAL | Status: AC
  Administered 2019-07-19: 2.5 mg via ORAL
  Filled 2019-07-19: qty 1

## 2019-07-19 MED ORDER — METRONIDAZOLE IN NACL 5-0.79 MG/ML-% IV SOLN
500.0000 mg | Freq: Three times a day (TID) | INTRAVENOUS | Status: DC
  Administered 2019-07-19: 500 mg via INTRAVENOUS
  Filled 2019-07-19: qty 100

## 2019-07-19 MED ORDER — LIDOCAINE 5 % EX PTCH
1.0000 | MEDICATED_PATCH | Freq: Every day | CUTANEOUS | Status: DC
  Administered 2019-07-21: 1 via TRANSDERMAL
  Filled 2019-07-19 (×2): qty 1

## 2019-07-19 NOTE — H&P (Signed)
History and Physical    DAJA SHUPING OEU:[id number] DOB: [birth year] DOA: 07/19/2019  PCP: [name], MD   Patient coming from: Home.  I have personally briefly reviewed patient's old medical records in [facility]  Chief Complaint: Diarrhea for 5 days.  HPI: Rebecca [name] is a 79 y.o. female with medical history significant of aortic stenosis, other cataplexy, diastolic heart failure, essential hypertension, GERD/hiatal hernia, history of recurrent DVT, other narcolepsy, peripheral neuropathy, unspecified chronic renal disease, type 2 diabetes, morbid obesity on home oxygen who is coming to the emergency department due to at least 5 days of loose stools multiple times a day after being on 2 courses of antibiotics (Bactrim and an unknown antibiotic) over the past 2 months for urine tract infection.  Earlier today, when she was evaluated by the hospice nurse, she noticed that the patient was confused and her stools smelled like previous patients with C. difficile colitis.  The patient is unable to provide further information at this time.  ED Course: Initial vital signs are temperature 97.8 F, pulse 60, respirations 20, blood pressure 142/79 mmHg and O2 sat 100% on room air.  The patient received ciprofloxacin 400 mg IVPB in the ED.  Urinalysis was cloudy with moderate hemoglobinuria and moderate leukocyte esterase.  There was proteinuria of 100 mg/dL.  RBC and WBC were both more than 50 per hpf.  There were many bacteria present on microscopic examination.  CBC had a white count of 14.0 with 80% neutrophils, 11% lymphocytes and 7% monocytes.  Lipase was normal.  CMP showed a sodium of 132 and chloride of 92 mmol/L, the rest of the electrolytes are within normal range.  Glucose 201, BUN 28 and creatinine 0.81 mg/dL.  LFTs were unremarkable, except for a low albumin of 2.7 g/dL.  Imaging: Her chest radiograph and CT head did not have any acute abnormality.  Please see images and  full radiology report for further detail.  Review of Systems: As per HPI otherwise all other systems reviewed and are negative.  Past Medical History:  Diagnosis Date  . Aortic stenosis   . Cataplexy   . Diastolic heart failure (HCC)   . Essential hypertension   . GERD (gastroesophageal reflux disease)   . Hiatal hernia   . History of recurrent deep vein thrombosis (DVT)   . Mitral stenosis   . Narcolepsy   . Neuropathy   . Renal disorder   . Type 2 diabetes mellitus (HCC)    Past Surgical History:  Procedure Laterality Date  . ABDOMINAL HYSTERECTOMY    . CARPAL TUNNEL RELEASE    . CARPECTOMY  12/26/2011   Procedure: CARPECTOMY;  Surgeon: Wyn Forster., MD;  Location: [facility];  Service: Orthopedics;  Laterality: Right;  PROXIMAL ROW CARPECTOMY RIGHT WRIST and neurectomy  . CHOLECYSTECTOMY    . HEMORROIDECTOMY    . KIDNEY SURGERY    . NEPHROSTOMY    . PACEMAKER IMPLANT N/A 10/25/2016   Procedure: Pacemaker Implant;  Surgeon: Regan Lemming, MD;  Location: [facility] INVASIVE CV LAB;  Service: Cardiovascular;  Laterality: N/A;  . TOTAL HIP ARTHROPLASTY     bilateral   Social History  reports that she quit smoking about 30 years ago. She has never used smokeless tobacco. She reports that she does not drink alcohol and does not use drugs.  Allergies  Allergen Reactions  . Codeine Anxiety  . Compazine Anaphylaxis  . Other Anaphylaxis, Rash and Other (See  Comments)    Uncoded Allergy. Allergen: INOVAR Uncoded Allergy. Allergen: ALL ADHESIVES Uncoded Allergy. Allergen: SILK SUTURE Uncoded Allergy. Allergen: merthiolate Thermasol  . Penicillins Shortness Of Breath and Rash    Has patient had a PCN reaction causing immediate rash, facial/tongue/throat swelling, SOB or lightheadedness with hypotension: Yes Has patient had a PCN reaction causing severe rash involving mucus membranes or skin necrosis: No Has patient had a PCN reaction that required  hospitalization No Has patient had a PCN reaction occurring within the last 10 years: Yes If all of the above answers are "NO", then may proceed with Cephalosporin use.   Marland Kitchen Morphine And Related Other (See Comments)    Patient states that it makes her lose her state of mind.   . Demerol [Meperidine] Rash   Family History  Problem Relation Age of Onset  . Heart disease Father   . Diabetes Other   . Stroke Mother   . Scleroderma Brother    Prior to Admission medications   Medication Sig Start Date End Date Taking? Authorizing Provider  carvedilol (COREG) 3.125 MG tablet Take 1 tablet (3.125 mg total) by mouth 2 (two) times daily with a meal. 06/29/19   Strader, [place] M, PA-C  Cholecalciferol (VITAMIN D3) 3000 UNITS TABS Take 1 capsule by mouth daily.    [provider]  clobetasol cream (TEMOVATE) 0.05 % Apply 1 application topically daily.  11/11/18   [provider]  clomiPRAMINE (ANAFRANIL) 25 MG capsule Take 1 capsule (25 mg total) by mouth 2 (two) times daily. Patient taking differently: Take 25 mg by mouth in the morning, at noon, in the evening, and at bedtime.  01/07/19   Shon Hale, MD  clonazePAM (KLONOPIN) 1 MG tablet Take 1 tablet (1 mg total) by mouth at bedtime. 12/15/18   Leroy Sea, MD  diclofenac Sodium (VOLTAREN) 1 % GEL Apply 1 application topically daily as needed. 03/14/19   [provider]  DULoxetine (CYMBALTA) 60 MG capsule Take 100 mg by mouth daily.     [provider]  ferrous sulfate 325 (65 FE) MG tablet Take 325 mg by mouth 2 (two) times daily with a meal.     [provider]  furosemide (LASIX) 20 MG tablet Take 1 tablet (20 mg total) by mouth 2 (two) times daily. 06/29/19   Strader, Lennart Pall, PA-C  gabapentin (NEURONTIN) 300 MG capsule Take 300 mg by mouth 2 (two) times daily.    [provider]  insulin glargine (LANTUS) 100 unit/mL SOPN Inject 0.2 mLs (20 Units total) into the skin  daily. Patient taking differently: Inject 20-24 Units into the skin daily.  01/07/19   Shon Hale, MD  lidocaine (LIDODERM) 5 % Place 1 patch onto the skin daily. Remove & Discard patch within 12 hours or as directed by MD 03/24/19   Henderly, Britni A, PA-C  magnesium oxide (MAG-OX) 400 MG tablet Take 400 mg by mouth daily.    [provider]  metFORMIN (GLUCOPHAGE) 500 MG tablet Take 500 mg by mouth 2 (two) times daily with a meal.    [provider]  Morphine Sulfate (MORPHINE CONCENTRATE) 10 mg / 0.5 ml concentrated solution Take by mouth. 06/10/19   [provider]  Multiple Vitamin (MULTIVITAMIN WITH MINERALS) TABS tablet Take 1 tablet by mouth daily.    [provider]  potassium chloride (KLOR-CON) 10 MEQ tablet Take 1 tablet (10 mEq total) by mouth daily. 01/07/19   Shon Hale, MD  QUEtiapine (  SEROQUEL) 100 MG tablet Take 100 mg by mouth at bedtime.    [provider]  TURMERIC PO Take 1 capsule by mouth daily.    [provider]  vitamin C (ASCORBIC ACID) 500 MG tablet Take 500 mg by mouth daily.    [provider]  warfarin (COUMADIN) 5 MG tablet Take 2.5 mg by mouth daily.     [provider]  zinc sulfate 220 (50 Zn) MG capsule Take 220 mg by mouth daily.    [provider]   Physical Exam: Vitals:   07/19/19 1404 07/19/19 2000 07/19/19 2030 07/19/19 2121  BP:  (!) 149/56 (!) 148/50 133/61  Pulse:  60 (!) 59 66  Resp:  18 17 18   Temp: 97.8 F (36.6 C)   97.8 F (36.6 C)  TempSrc: Oral   Oral  SpO2:  99% 94% 100%  Weight:      Height:       Constitutional: Looks chronically ill. Eyes: PERRL, lids and conjunctivae normal ENMT: Mucous membranes are dry. Posterior pharynx clear of any exudate or lesions. Neck: normal, supple, no masses, no thyromegaly Respiratory: Decreased breath sounds in bases, otherwise clear to auscultation bilaterally, no wheezing, no crackles. Normal respiratory  effort. No accessory muscle use.  Cardiovascular: Regular rate and rhythm, positive systolic murmur, no rubs / gallops. No extremity edema. 2+ pedal pulses. No carotid bruits.  Abdomen: Obese, nondistended.  No tenderness, no masses palpated. No hepatosplenomegaly. Bowel sounds positive.  Musculoskeletal: Generalized weakness.  No clubbing / cyanosis.  Good ROM, no contractures. Normal muscle tone.  Skin: Small areas of ecchymosis on extremities. Neurologic: CN 2-12 grossly intact. Sensation intact, DTR normal.  Generalized nonfocal weakness. Psychiatric: Somnolent.  Wakes up and answer questions.  Labs on Admission: I have personally reviewed following labs and imaging studies  CBC: Recent Labs  Lab 07/19/19 1532  WBC 14.0*  NEUTROABS 11.1*  HGB 12.3  HCT 38.3  MCV 91.8  PLT 351    Basic Metabolic Panel: Recent Labs  Lab 07/19/19 1532  NA 132*  K 4.2  CL 92*  CO2 29  GLUCOSE 201*  BUN 28*  CREATININE 0.81  CALCIUM 9.1  MG 2.0   GFR: Estimated Creatinine Clearance: 63.7 mL/min (by C-G formula based on SCr of 0.81 mg/dL).  Liver Function Tests: Recent Labs  Lab 07/19/19 1532  AST 20  ALT 18  ALKPHOS 55  BILITOT 0.8  PROT 6.9  ALBUMIN 2.7*    Urine analysis:    Component Value Date/Time   COLORURINE YELLOW 07/19/2019 1548   APPEARANCEUR CLOUDY (A) 07/19/2019 1548   LABSPEC 1.021 07/19/2019 1548   PHURINE 5.0 07/19/2019 1548   GLUCOSEU NEGATIVE 07/19/2019 1548   HGBUR MODERATE (A) 07/19/2019 1548   BILIRUBINUR NEGATIVE 07/19/2019 1548   KETONESUR NEGATIVE 07/19/2019 1548   PROTEINUR 100 (A) 07/19/2019 1548   UROBILINOGEN 0.2 09/05/2014 2015   NITRITE NEGATIVE 07/19/2019 1548   LEUKOCYTESUR MODERATE (A) 07/19/2019 1548    Radiological Exams on Admission: CT Head Wo Contrast  Result Date: 07/19/2019 CLINICAL DATA:  Encephalopathy.  Headache EXAM: CT HEAD WITHOUT CONTRAST TECHNIQUE: Contiguous axial images were obtained from the base of the skull  through the vertex without intravenous contrast. COMPARISON:  CT head 03/24/2019 FINDINGS: Brain: Moderate atrophy with enlargement of the ventricles and subarachnoid space, similar to the prior study. Moderate hypodensity throughout the cerebral white matter unchanged from the prior study. Negative for acute infarct, hemorrhage, mass. Vascular: Negative for hyperdense  vessel. Atherosclerotic calcification right middle cerebral artery and in the cavernous carotid bilaterally. Skull: Negative Sinuses/Orbits: Paranasal sinuses clear.  No orbital mass. Other: None IMPRESSION: Moderate atrophy and moderate chronic microvascular ischemic change. No acute abnormality and no change from the prior CT. Electronically Signed   By: Marlan Palau M.D.   On: 07/19/2019 18:39   DG Chest Port 1 View  Result Date: 07/19/2019 CLINICAL DATA:  Confusion. EXAM: PORTABLE CHEST 1 VIEW COMPARISON:  Chest x-ray and chest CT dated 03/24/2019 FINDINGS: Heart size is within normal limits considering the portable technique. Pulmonary vascularity is normal. Pacemaker in place. Slight chronic accentuation of the interstitial markings. No acute infiltrates or effusions. No acute bone abnormality. IMPRESSION: No acute abnormalities. Electronically Signed   By: Francene Boyers M.D.   On: 07/19/2019 17:44   12/09/2018 echocardiogram  IMPRESSIONS   1. Left ventricular ejection fraction, by visual estimation, is 45 to  50%. The left ventricle has mildly decreased function. There is mildly  increased left ventricular hypertrophy.  2. Entire apex, mid and apical inferior septum, mid anteroseptal segment,  and basal inferolateral segment are abnormal.  3. Left ventricular diastolic parameters are indeterminate.  4. Global right ventricle has mildly reduced systolic function.The right  ventricular size is normal. No increase in right ventricular wall  thickness.  5. Left atrial size was severely dilated.  6. Right atrial size was  moderately dilated.  7. Severe mitral annular calcification.  8. Severe thickening of the mitral valve leaflet(s).  9. The mitral valve is abnormal. Trace mitral valve regurgitation. Severe  mitral stenosis. Mean gradient 10 mmHg.  10. The tricuspid valve is grossly normal. Tricuspid valve regurgitation  is mild.  11. Aortic valve mean gradient measures 13.0 mmHg.  12. The aortic valve is tricuspid. Aortic valve regurgitation is mild.  Leaflets are moderately calcified and cusp excursion is moderately  reduced. Although not fully interrogated, suspect at least moderate aortic  stenosis.  13. The pulmonic valve was grossly normal. Pulmonic valve regurgitation is  trivial.  14. TR signal is inadequate for assessing pulmonary artery systolic  pressure.  15. A pacer wire is visualized.  16. The inferior vena cava is normal in size with greater than 50%  respiratory variability, suggesting right atrial pressure of 3 mmHg.   EKG: Independently reviewed.  Assessment/Plan Principal Problem:   Acute urinary tract infection Observation/telemetry. Continue gentle IVF. Continue ciprofloxacin 400 mg p.o. every 12 hours. Follow-up urine culture and sensitivity.  Active Problems:   Clostridium difficile diarrhea Positive antigen, but no toxin. At least 5 days of loose stools  Discontinue metronidazole. Begin oral vancomycin. Keep contact precautions in place.    Paroxysmal atrial fibrillation (HCC) CHA?DS?-VASc Score of at least 7. On carvedilol. On warfarin per pharmacy.    GERD (gastroesophageal reflux disease) Protonix 40 mg p.o. daily.    Essential hypertension Continue carvedilol 3.125 mg p.o. twice daily. On furosemide 20 mg p.o. twice daily.    Depression Continue Cymbalta 60 mg p.o. at bedtime. Continue Seroquel 100 mg p.o. bedtime. Use clonazepam as needed at bedtime.     CKD (chronic kidney disease), stage III Around baseline. Hold diuretic while having  diarrhea. Monitor renal function electrolytes.    History of recurrent deep vein thrombosis (DVT) On warfarin per pharmacy.    Type 2 diabetes mellitus (HCC) Carbohydrate modified diet. Continue Lantus 20 units SQ twice daily. CBG monitoring with RI SS.    Chronic combined systolic and diastolic congestive heart failure (HCC)  Compensated. Hold diuretic while having diarrhea. Continue beta-blocker.    DVT prophylaxis: On Warfarin. Code Status:   Full code. Family Communication: Disposition Plan:   Patient is from:  Home.  Anticipated DC to:  Home.  Anticipated DC date:  07/21/2019  Anticipated DC barriers: Clinical improvement. Consults called:  TOC team. Admission status:  Observation/telemetry.  Severity of Illness:  Medium to high.  Reubin Milan MD [facility]  How to contact the [facility] Attending or Consulting provider [place] or covering provider during after hours [place], for this patient?   1. Check the care team in [facility] and look for a) attending/consulting [facility] provider listed and b) the [facility] team listed 2. Log into [email or web address] and use [facility] universal password to access. If you do not have the password, please contact the hospital operator. 3. Locate the [facility] provider you are looking for under [facility] and page to a number that you can be directly reached. 4. If you still have difficulty reaching the provider, please page the [facility] (Director on Call) for the Hospitalists listed on amion for assistance.  07/19/2019, 9:22 PM   This document was prepared using Dragon voice recognition software and may contain some unintended transcription errors.

## 2019-07-19 NOTE — ED Triage Notes (Signed)
EMS reports pt bedridden and on hospice since having covid in nov.  Reports diarrhea x 5 days.  PCP sent pt to rule out cdiff.

## 2019-07-19 NOTE — Progress Notes (Signed)
ANTICOAGULATION CONSULT NOTE - Initial Consult  Pharmacy Consult for Warfarin Indication: atrial fibrillation and history of recurrent DVTs  Allergies  Allergen Reactions  . Codeine Anxiety  . Compazine Anaphylaxis  . Other Anaphylaxis, Rash and Other (See Comments)    Uncoded Allergy. Allergen: INOVAR Uncoded Allergy. Allergen: ALL ADHESIVES Uncoded Allergy. Allergen: SILK SUTURE Uncoded Allergy. Allergen: merthiolate Thermasol  . Penicillins Shortness Of Breath and Rash    Has patient had a PCN reaction causing immediate rash, facial/tongue/throat swelling, SOB or lightheadedness with hypotension: Yes Has patient had a PCN reaction causing severe rash involving mucus membranes or skin necrosis: No Has patient had a PCN reaction that required hospitalization No Has patient had a PCN reaction occurring within the last 10 years: Yes If all of the above answers are "NO", then may proceed with Cephalosporin use.   Marland Kitchen Morphine And Related Other (See Comments)    Patient states that it makes her lose her state of mind.   . Demerol [Meperidine] Rash    Patient Measurements: Height: 5\' 6"  (167.6 cm) Weight: 90 kg (198 lb 6.6 oz) IBW/kg (Calculated) : 59.3  Vital Signs: Temp: 97.8 F (36.6 C) (06/14 2121) Temp Source: Oral (06/14 2121) BP: 133/61 (06/14 2121) Pulse Rate: 66 (06/14 2121)  Labs: Recent Labs    07/19/19 1532  HGB 12.3  HCT 38.3  PLT 351  LABPROT 15.2  INR 1.2  CREATININE 0.81    Estimated Creatinine Clearance: 63.7 mL/min (by C-G formula based on SCr of 0.81 mg/dL).   Medical History: Past Medical History:  Diagnosis Date  . Aortic stenosis   . Cataplexy   . Diastolic heart failure ([place])   . Essential hypertension   . GERD (gastroesophageal reflux disease)   . Hiatal hernia   . History of recurrent deep vein thrombosis (DVT)   . Mitral stenosis   . Narcolepsy   . Neuropathy   . Renal disorder   . Type 2 diabetes mellitus (HCC)      Medications:  Medications Prior to Admission  Medication Sig Dispense Refill Last Dose  . carvedilol (COREG) 3.125 MG tablet Take 1 tablet (3.125 mg total) by mouth 2 (two) times daily with a meal. 180 tablet 3   . Cholecalciferol (VITAMIN D3) 3000 UNITS TABS Take 1 capsule by mouth daily.     . clobetasol cream (TEMOVATE) 2.59 % Apply 1 application topically daily.      . clomiPRAMINE (ANAFRANIL) 25 MG capsule Take 1 capsule (25 mg total) by mouth 2 (two) times daily. (Patient taking differently: Take 25 mg by mouth in the morning, at noon, in the evening, and at bedtime. ) 60 capsule 2   . clonazePAM (KLONOPIN) 1 MG tablet Take 1 tablet (1 mg total) by mouth at bedtime.     . diclofenac Sodium (VOLTAREN) 1 % GEL Apply 1 application topically daily as needed.     . DULoxetine (CYMBALTA) 60 MG capsule Take 100 mg by mouth daily.      . ferrous sulfate 325 (65 FE) MG tablet Take 325 mg by mouth 2 (two) times daily with a meal.      . furosemide (LASIX) 20 MG tablet Take 1 tablet (20 mg total) by mouth 2 (two) times daily. 180 tablet 3   . gabapentin (NEURONTIN) 300 MG capsule Take 300 mg by mouth 2 (two) times daily.     . insulin glargine (LANTUS) 100 unit/mL SOPN Inject 0.2 mLs (20 Units total) into the skin daily. (  Patient taking differently: Inject 20-24 Units into the skin daily. ) 15 mL 11   . lidocaine (LIDODERM) 5 % Place 1 patch onto the skin daily. Remove & Discard patch within 12 hours or as directed by MD 30 patch 0   . magnesium oxide (MAG-OX) 400 MG tablet Take 400 mg by mouth daily.     . metFORMIN (GLUCOPHAGE) 500 MG tablet Take 500 mg by mouth 2 (two) times daily with a meal.     . Morphine Sulfate (MORPHINE CONCENTRATE) 10 mg / 0.5 ml concentrated solution Take by mouth.     . Multiple Vitamin (MULTIVITAMIN WITH MINERALS) TABS tablet Take 1 tablet by mouth daily.     . potassium chloride (KLOR-CON) 10 MEQ tablet Take 1 tablet (10 mEq total) by mouth daily. 90 tablet 3   .  QUEtiapine (SEROQUEL) 100 MG tablet Take 100 mg by mouth at bedtime.     . TURMERIC PO Take 1 capsule by mouth daily.     . vitamin C (ASCORBIC ACID) 500 MG tablet Take 500 mg by mouth daily.     Marland Kitchen warfarin (COUMADIN) 5 MG tablet Take 2.5 mg by mouth daily.      Marland Kitchen zinc sulfate 220 (50 Zn) MG capsule Take 220 mg by mouth daily.       Assessment: 79 year old female with a history of atrial fibrillation and recurrent DVTs on Warfarin was admitted on 07/19/19 with diarrhea for 5 days and concern for possible UTI. Patient's CBC is within normal limits. No bleeding reported.   Admission INR is sub-therapeutic at 1.2 (goal 2-3). Last dose of Warfarin was: unknown at this time. Home Warfarin regimen - unknown, I have called family as patient is too confused this PM. I was able to speak with her daughter who is unsure of the dose or last taken but does confirm patient is still taking the medication. Requested we follow-up with granddaughter or son in AM to confirm dosing.  Note - received Cipro x1 dose and remains on IV Flagyl which can interact with Warfarin.   Goal of Therapy:  INR 2-3 Monitor platelets by anticoagulation protocol: Yes   Plan:  Due to low INR, will give a low dose of Warfarin 2.5mg  po x1 tonight (as starting of interacting antibiotics).  Follow-up in AM to confirm accurate home regimen.  Daily PT/INR Monitor for drug interactions or signs and symptoms of bleeding.   Link Snuffer, PharmD, BCPS, BCCCP Clinical Pharmacist Please refer to [facility] for [facility] Pharmacy numbers 07/19/2019,9:37 PM

## 2019-07-19 NOTE — ED Provider Notes (Signed)
[facility] EMERGENCY DEPARTMENT Provider Note   CSN: [id number] Arrival date & time: 07/19/19  1354     History Chief Complaint  Patient presents with  . Diarrhea    Rebecca [name] is a 79 y.o. female with a history of type 2 diabetes, hypertension, CHF severe aortic stenosis, mitral stenosis history of narcolepsy and cataplexy presenting for evaluation of a 1 month history of diarrhea per patient's report.  Patient appears to be confused, frequently drifting off to sleep then waking and can give brief information before falling asleep, but does states she has history of narcolepsy.  Per triage, patient was sent here by her PCP to rule out C. difficile.  However, pt states her sister in law visited this am and thought she didn't look well, so called the ambulance.  Patient is mostly bedbound at home and under the care of hospice.  Patient is moderately confused and is unable to give a good history.  She is unable to quantify the amount of diarrhea.  The triage note suggest the symptoms started a week ago, patient states this started a month ago.    Level 5 caveat given confusion.    Initially after evaluation, I attempted to contact family members listed in her contact list, was unable to reach any body.  At reattempt, was able to speak with the granddaughter who cares for this patient during the week.  She reports that her grandmother has had recent UTIs, had been on an antibiotic last month, then recently completed a 3-day course of Bactrim, urine culture at that time was revealing for E. coli infection.  Her hospice nurse visited the patient this morning who confirmed her diarrhea smelled like C. difficile, hence transfer here for further evaluation.  Granddaughter also indicates that the patient is generally awake and alert, although does sleep frequently secondary to being on pain medication managed through hospice.  However over the past 2 days she has had increasing confusion.  The history  is provided by the patient and medical records. The history is limited by the condition of the patient.       Past Medical History:  Diagnosis Date  . Aortic stenosis   . Cataplexy   . Diastolic heart failure (HCC)   . Essential hypertension   . GERD (gastroesophageal reflux disease)   . Hiatal hernia   . History of recurrent deep vein thrombosis (DVT)   . Mitral stenosis   . Narcolepsy   . Neuropathy   . Renal disorder   . Type 2 diabetes mellitus [facility])     Patient Active Problem List   Diagnosis Date Noted  . Palliative care by specialist   . Goals of care, counseling/discussion   . Acute on chronic respiratory failure with hypoxia (HCC) 01/01/2019  . Paroxysmal atrial fibrillation (HCC) 12/09/2018  . Acute on chronic respiratory failure (HCC) 12/09/2018  . Pneumonia due to COVID-19 virus 12/08/2018  . History of recurrent deep vein thrombosis (DVT)   . Type 2 diabetes mellitus (HCC)   . Lobar pneumonia (HCC) 09/05/2018  . Acute on chronic diastolic CHF (congestive heart failure) (HCC) 09/05/2018  . CHF (congestive heart failure) (HCC) 09/04/2018  . HCAP (healthcare-associated pneumonia) 03/29/2017  . Cellulitis 03/03/2017  . Type 2 diabetes mellitus with other specified complication (HCC) 03/03/2017  . CKD (chronic kidney disease), stage III 03/03/2017  . Status post placement of cardiac pacemaker 10/29/2016  . Pulmonary edema 10/23/2016  . Acute respiratory failure with hypoxia (HCC) 02/22/2015  .  CAP (community acquired pneumonia) 02/22/2015  . Fall at home 09/21/2014  . Mild aortic stenosis 09/08/2014  . Mild to moderate  mitral stenosis 09/08/2014  . Chronic anticoagulation-Coumadin 09/08/2014  . Sepsis (HCC) 09/05/2014  . Elevated troponin 09/05/2014  . Lower leg DVT (deep venous thromboembolism), chronic (HCC) 09/05/2014  . Essential hypertension 09/05/2014  . Depression 09/05/2014  . Poorly controlled diabetes mellitus (HCC) 09/05/2014  . Hypomagnesemia  09/05/2014  . Hyponatremia 09/05/2014  . Pyelonephritis 09/05/2014  . Chronic diastolic CHF (congestive heart failure) (HCC)   . 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 GERD (gastroesophageal reflux disease) 07/08/2011  . Gastroparesis 07/08/2011  . IDA (iron deficiency anemia) 07/08/2011  . History of colonic polyps 07/08/2011  . Narcolepsy-evaluated at [facility] 07/08/2011  . NAFLD (nonalcoholic fatty liver disease) 78/67/6720  . Obesity 07/08/2011    Past Surgical History:  Procedure Laterality Date  . ABDOMINAL HYSTERECTOMY    . CARPAL TUNNEL RELEASE    . CARPECTOMY  12/26/2011   Procedure: CARPECTOMY;  Surgeon: Wyn Forster., MD;  Location: [facility];  Service: Orthopedics;  Laterality: Right;  PROXIMAL ROW CARPECTOMY RIGHT WRIST and neurectomy  . CHOLECYSTECTOMY    . HEMORROIDECTOMY    . KIDNEY SURGERY    . NEPHROSTOMY    . PACEMAKER IMPLANT N/A 10/25/2016   Procedure: Pacemaker Implant;  Surgeon: Regan Lemming, MD;  Location: [facility] INVASIVE CV LAB;  Service: Cardiovascular;  Laterality: N/A;  . TOTAL HIP ARTHROPLASTY     bilateral     OB History   No obstetric history on file.     Family History  Problem Relation Age of Onset  . Heart disease Father   . Diabetes Other   . Stroke Mother   . Scleroderma Brother     Social History   Tobacco Use  . Smoking status: Former Smoker    Quit date: 10/13/1988    Years since quitting: 30.7  . Smokeless tobacco: Never Used  Vaping Use  . Vaping Use: Never used  Substance Use Topics  . Alcohol use: No    Alcohol/week: 0.0 standard drinks  . Drug use: No    Home Medications Prior to Admission medications   Medication Sig Start Date End Date Taking? Authorizing Provider  carvedilol (COREG) 3.125 MG tablet Take 1 tablet (3.125 mg total) by mouth 2 (two) times daily with a meal. 06/29/19   Strader, [place] M, PA-C  Cholecalciferol (VITAMIN D3) 3000 UNITS TABS Take 1 capsule by  mouth daily.    [provider]  clobetasol cream (TEMOVATE) 0.05 % Apply 1 application topically daily.  11/11/18   [provider]  clomiPRAMINE (ANAFRANIL) 25 MG capsule Take 1 capsule (25 mg total) by mouth 2 (two) times daily. Patient taking differently: Take 25 mg by mouth in the morning, at noon, in the evening, and at bedtime.  01/07/19   Shon Hale, MD  clonazePAM (KLONOPIN) 1 MG tablet Take 1 tablet (1 mg total) by mouth at bedtime. 12/15/18   Leroy Sea, MD  diclofenac Sodium (VOLTAREN) 1 % GEL Apply 1 application topically daily as needed. 03/14/19   [provider]  DULoxetine (CYMBALTA) 60 MG capsule Take 100 mg by mouth daily.     [provider]  ferrous sulfate 325 (65 FE) MG tablet Take 325 mg by mouth 2 (two) times daily with a meal.     [provider]  furosemide (LASIX) 20 MG tablet Take 1  tablet (20 mg total) by mouth 2 (two) times daily. 06/29/19   Strader, Lennart Pall, PA-C  gabapentin (NEURONTIN) 300 MG capsule Take 300 mg by mouth 2 (two) times daily.    [provider]  insulin glargine (LANTUS) 100 unit/mL SOPN Inject 0.2 mLs (20 Units total) into the skin daily. Patient taking differently: Inject 20-24 Units into the skin daily.  01/07/19   Shon Hale, MD  lidocaine (LIDODERM) 5 % Place 1 patch onto the skin daily. Remove & Discard patch within 12 hours or as directed by MD 03/24/19   Henderly, Britni A, PA-C  magnesium oxide (MAG-OX) 400 MG tablet Take 400 mg by mouth daily.    [provider]  metFORMIN (GLUCOPHAGE) 500 MG tablet Take 500 mg by mouth 2 (two) times daily with a meal.    [provider]  Morphine Sulfate (MORPHINE CONCENTRATE) 10 mg / 0.5 ml concentrated solution Take by mouth. 06/10/19   [provider]  Multiple Vitamin (MULTIVITAMIN WITH MINERALS) TABS tablet Take 1 tablet by mouth daily.    [provider]  potassium chloride (KLOR-CON) 10 MEQ tablet  Take 1 tablet (10 mEq total) by mouth daily. 01/07/19   Shon Hale, MD  QUEtiapine (SEROQUEL) 100 MG tablet Take 100 mg by mouth at bedtime.    [provider]  TURMERIC PO Take 1 capsule by mouth daily.    [provider]  vitamin C (ASCORBIC ACID) 500 MG tablet Take 500 mg by mouth daily.    [provider]  warfarin (COUMADIN) 5 MG tablet Take 2.5 mg by mouth daily.     [provider]  zinc sulfate 220 (50 Zn) MG capsule Take 220 mg by mouth daily.    [provider]    Allergies    Codeine, Compazine, Other, Penicillins, Morphine and related, and Demerol [meperidine]  Review of Systems   Review of Systems  Unable to perform ROS: Mental status change  Gastrointestinal: Positive for diarrhea.    Physical Exam Updated Vital Signs BP (!) 142/79 (BP Location: Left Arm)   Pulse 60   Temp 97.8 F (36.6 C) (Oral)   Resp 20   Ht 5\' 6"  (1.676 m)   Wt 90 kg   SpO2 100%   BMI 32.02 kg/m   Physical Exam Vitals and nursing note reviewed.  Constitutional:      General: She is not in acute distress.    Appearance: She is obese.     Comments: Wakes with stimulation, but quickly drifts back to sleep.  HENT:     Head: Normocephalic and atraumatic.     Mouth/Throat:     Mouth: Mucous membranes are dry.  Eyes:     Conjunctiva/sclera: Conjunctivae normal.  Cardiovascular:     Rate and Rhythm: Normal rate and regular rhythm.     Heart sounds: Murmur heard.   Pulmonary:     Effort: Pulmonary effort is normal.     Breath sounds: Normal breath sounds. No wheezing or rhonchi.  Abdominal:     General: Bowel sounds are normal.     Palpations: Abdomen is soft.     Tenderness: There is no abdominal tenderness. There is no guarding.  Musculoskeletal:        General: Normal range of motion.     Cervical back: Normal range of motion.  Skin:    General: Skin is warm and dry.  Neurological:     Mental Status: She is lethargic.  ED  Results / Procedures / Treatments   Labs (all labs ordered are listed, but only abnormal results are displayed) Labs Reviewed  CBC WITH DIFFERENTIAL/PLATELET - Abnormal; Notable for the following components:      Result Value   WBC 14.0 (*)    RDW 16.2 (*)    Neutro Abs 11.1 (*)    All other components within normal limits  COMPREHENSIVE METABOLIC PANEL - Abnormal; Notable for the following components:   Sodium 132 (*)    Chloride 92 (*)    Glucose, Bld 201 (*)    BUN 28 (*)    Albumin 2.7 (*)    All other components within normal limits  URINALYSIS, ROUTINE W REFLEX MICROSCOPIC - Abnormal; Notable for the following components:   APPearance CLOUDY (*)    Hgb urine dipstick MODERATE (*)    Protein, ur 100 (*)    Leukocytes,Ua MODERATE (*)    RBC / HPF >50 (*)    WBC, UA >50 (*)    Bacteria, UA MANY (*)    Non Squamous Epithelial 0-5 (*)    All other components within normal limits  BLOOD GAS, ARTERIAL - Abnormal; Notable for the following components:   pH, Arterial 7.466 (*)    pO2, Arterial 53.6 (*)    Bicarbonate 30.4 (*)    Acid-Base Excess 7.1 (*)    All other components within normal limits  C DIFFICILE QUICK SCREEN W PCR REFLEX  URINE CULTURE  LIPASE, BLOOD    EKG None  Radiology CT Head Wo Contrast  Result Date: 07/19/2019 CLINICAL DATA:  Encephalopathy.  Headache EXAM: CT HEAD WITHOUT CONTRAST TECHNIQUE: Contiguous axial images were obtained from the base of the skull through the vertex without intravenous contrast. COMPARISON:  CT head 03/24/2019 FINDINGS: Brain: Moderate atrophy with enlargement of the ventricles and subarachnoid space, similar to the prior study. Moderate hypodensity throughout the cerebral white matter unchanged from the prior study. Negative for acute infarct, hemorrhage, mass. Vascular: Negative for hyperdense vessel. Atherosclerotic calcification right middle cerebral artery and in the cavernous carotid bilaterally. Skull: Negative  Sinuses/Orbits: Paranasal sinuses clear.  No orbital mass. Other: None IMPRESSION: Moderate atrophy and moderate chronic microvascular ischemic change. No acute abnormality and no change from the prior CT. Electronically Signed   By: Franchot Gallo M.D.   On: 07/19/2019 18:39   DG Chest Port 1 View  Result Date: 07/19/2019 CLINICAL DATA:  Confusion. EXAM: PORTABLE CHEST 1 VIEW COMPARISON:  Chest x-ray and chest CT dated 03/24/2019 FINDINGS: Heart size is within normal limits considering the portable technique. Pulmonary vascularity is normal. Pacemaker in place. Slight chronic accentuation of the interstitial markings. No acute infiltrates or effusions. No acute bone abnormality. IMPRESSION: No acute abnormalities. Electronically Signed   By: Lorriane Shire M.D.   On: 07/19/2019 17:44    Procedures Procedures (including critical care time)  Medications Ordered in ED Medications  ciprofloxacin (CIPRO) IVPB 400 mg (has no administration in time range)    ED Course  I have reviewed the triage vital signs and the nursing notes.  Pertinent labs & imaging results that were available during my care of the patient were reviewed by me and considered in my medical decision making (see chart for details).    MDM Rules/Calculators/A&P                          Patient is from home but in hospice care secondary to severe cardiac  decompensation with persistent UTI which has failed outpatient treatment.  Now with diarrhea suspicious to be of C. difficile origin.  Still pending C. difficile results at this time.  She does have an elevated WBC count and is fairly lethargic which is not her baseline per family's description, suspect secondary to this UTI.   She initially refused an IV, but is now agreeable now that she knows she is going to be admitted and she needs IV antibiotics.  CT imaging is negative for acute intercranial process.    Call placed to hospitalist for admission.  Discussed with Dr. Robb Matar  who accepts pt for admission.  Final Clinical Impression(s) / ED Diagnoses Final diagnoses:  Urinary tract infection with hematuria, site unspecified  Diarrhea of presumed infectious origin  Confusion    Rx / DC Orders ED Discharge Orders    None       Victoriano Lain 07/19/19 1924    Bethann Berkshire, MD 07/20/19 2234

## 2019-07-20 DIAGNOSIS — Z66 Do not resuscitate: Secondary | ICD-10-CM | POA: Diagnosis present

## 2019-07-20 DIAGNOSIS — I5042 Chronic combined systolic (congestive) and diastolic (congestive) heart failure: Secondary | ICD-10-CM | POA: Diagnosis present

## 2019-07-20 DIAGNOSIS — F329 Major depressive disorder, single episode, unspecified: Secondary | ICD-10-CM | POA: Diagnosis present

## 2019-07-20 DIAGNOSIS — R627 Adult failure to thrive: Secondary | ICD-10-CM | POA: Diagnosis present

## 2019-07-20 DIAGNOSIS — A0472 Enterocolitis due to Clostridium difficile, not specified as recurrent: Secondary | ICD-10-CM | POA: Diagnosis present

## 2019-07-20 DIAGNOSIS — Z950 Presence of cardiac pacemaker: Secondary | ICD-10-CM | POA: Diagnosis not present

## 2019-07-20 DIAGNOSIS — Z96643 Presence of artificial hip joint, bilateral: Secondary | ICD-10-CM | POA: Diagnosis present

## 2019-07-20 DIAGNOSIS — N183 Chronic kidney disease, stage 3 unspecified: Secondary | ICD-10-CM | POA: Diagnosis not present

## 2019-07-20 DIAGNOSIS — I442 Atrioventricular block, complete: Secondary | ICD-10-CM | POA: Diagnosis present

## 2019-07-20 DIAGNOSIS — E871 Hypo-osmolality and hyponatremia: Secondary | ICD-10-CM | POA: Diagnosis present

## 2019-07-20 DIAGNOSIS — R319 Hematuria, unspecified: Secondary | ICD-10-CM | POA: Diagnosis present

## 2019-07-20 DIAGNOSIS — B9620 Unspecified Escherichia coli [E. coli] as the cause of diseases classified elsewhere: Secondary | ICD-10-CM | POA: Diagnosis present

## 2019-07-20 DIAGNOSIS — Z86718 Personal history of other venous thrombosis and embolism: Secondary | ICD-10-CM | POA: Diagnosis not present

## 2019-07-20 DIAGNOSIS — R197 Diarrhea, unspecified: Secondary | ICD-10-CM | POA: Diagnosis present

## 2019-07-20 DIAGNOSIS — E1122 Type 2 diabetes mellitus with diabetic chronic kidney disease: Secondary | ICD-10-CM | POA: Diagnosis present

## 2019-07-20 DIAGNOSIS — I10 Essential (primary) hypertension: Secondary | ICD-10-CM

## 2019-07-20 DIAGNOSIS — N1831 Chronic kidney disease, stage 3a: Secondary | ICD-10-CM | POA: Diagnosis present

## 2019-07-20 DIAGNOSIS — Z20822 Contact with and (suspected) exposure to covid-19: Secondary | ICD-10-CM | POA: Diagnosis present

## 2019-07-20 DIAGNOSIS — R823 Hemoglobinuria: Secondary | ICD-10-CM | POA: Diagnosis present

## 2019-07-20 DIAGNOSIS — G47411 Narcolepsy with cataplexy: Secondary | ICD-10-CM | POA: Diagnosis present

## 2019-07-20 DIAGNOSIS — K219 Gastro-esophageal reflux disease without esophagitis: Secondary | ICD-10-CM | POA: Diagnosis present

## 2019-07-20 DIAGNOSIS — E1165 Type 2 diabetes mellitus with hyperglycemia: Secondary | ICD-10-CM | POA: Diagnosis present

## 2019-07-20 DIAGNOSIS — I130 Hypertensive heart and chronic kidney disease with heart failure and stage 1 through stage 4 chronic kidney disease, or unspecified chronic kidney disease: Secondary | ICD-10-CM | POA: Diagnosis present

## 2019-07-20 DIAGNOSIS — I083 Combined rheumatic disorders of mitral, aortic and tricuspid valves: Secondary | ICD-10-CM | POA: Diagnosis present

## 2019-07-20 DIAGNOSIS — J9611 Chronic respiratory failure with hypoxia: Secondary | ICD-10-CM | POA: Diagnosis present

## 2019-07-20 DIAGNOSIS — N390 Urinary tract infection, site not specified: Secondary | ICD-10-CM | POA: Diagnosis present

## 2019-07-20 DIAGNOSIS — I480 Paroxysmal atrial fibrillation: Secondary | ICD-10-CM | POA: Diagnosis present

## 2019-07-20 LAB — PROTIME-INR
INR: 1.4 — ABNORMAL HIGH (ref 0.8–1.2)
Prothrombin Time: 16.3 seconds — ABNORMAL HIGH (ref 11.4–15.2)

## 2019-07-20 LAB — COMPREHENSIVE METABOLIC PANEL
ALT: 18 U/L (ref 0–44)
AST: 19 U/L (ref 15–41)
Albumin: 2.8 g/dL — ABNORMAL LOW (ref 3.5–5.0)
Alkaline Phosphatase: 55 U/L (ref 38–126)
Anion gap: 12 (ref 5–15)
BUN: 30 mg/dL — ABNORMAL HIGH (ref 8–23)
CO2: 28 mmol/L (ref 22–32)
Calcium: 9.3 mg/dL (ref 8.9–10.3)
Chloride: 94 mmol/L — ABNORMAL LOW (ref 98–111)
Creatinine, Ser: 0.86 mg/dL (ref 0.44–1.00)
GFR calc Af Amer: >60 mL/min (ref >60)
GFR calc non Af Amer: >60 mL/min (ref >60)
Glucose, Bld: 166 mg/dL — ABNORMAL HIGH (ref 70–99)
Potassium: 4.1 mmol/L (ref 3.5–5.1)
Sodium: 134 mmol/L — ABNORMAL LOW (ref 135–145)
Total Bilirubin: 1 mg/dL (ref 0.3–1.2)
Total Protein: 6.8 g/dL (ref 6.5–8.1)

## 2019-07-20 LAB — CBC WITH DIFFERENTIAL/PLATELET
Abs Immature Granulocytes: 0.04 10*3/uL (ref 0.00–0.07)
Basophils Absolute: 0.1 10*3/uL (ref 0.0–0.1)
Basophils Relative: 0 %
Eosinophils Absolute: 0.2 10*3/uL (ref 0.0–0.5)
Eosinophils Relative: 2 %
HCT: 37.1 % (ref 36.0–46.0)
Hemoglobin: 11.8 g/dL — ABNORMAL LOW (ref 12.0–15.0)
Immature Granulocytes: 0 %
Lymphocytes Relative: 15 %
Lymphs Abs: 1.7 10*3/uL (ref 0.7–4.0)
MCH: 29.5 pg (ref 26.0–34.0)
MCHC: 31.8 g/dL (ref 30.0–36.0)
MCV: 92.8 fL (ref 80.0–100.0)
Monocytes Absolute: 0.9 10*3/uL (ref 0.1–1.0)
Monocytes Relative: 8 %
Neutro Abs: 8.6 10*3/uL — ABNORMAL HIGH (ref 1.7–7.7)
Neutrophils Relative %: 75 %
Platelets: 325 10*3/uL (ref 150–400)
RBC: 4 MIL/uL (ref 3.87–5.11)
RDW: 16.2 % — ABNORMAL HIGH (ref 11.5–15.5)
WBC: 11.5 10*3/uL — ABNORMAL HIGH (ref 4.0–10.5)
nRBC: 0 % (ref 0.0–0.2)

## 2019-07-20 LAB — GLUCOSE, CAPILLARY
Glucose-Capillary: 166 mg/dL — ABNORMAL HIGH (ref 70–99)
Glucose-Capillary: 121 mg/dL — ABNORMAL HIGH (ref 70–99)
Glucose-Capillary: 147 mg/dL — ABNORMAL HIGH (ref 70–99)
Glucose-Capillary: 122 mg/dL — ABNORMAL HIGH (ref 70–99)

## 2019-07-20 LAB — C DIFFICILE QUICK SCREEN W PCR REFLEX
C Diff antigen: POSITIVE — AB
C Diff toxin: NEGATIVE

## 2019-07-20 LAB — HEMOGLOBIN A1C
Hgb A1c MFr Bld: 6.7 % — ABNORMAL HIGH (ref 4.8–5.6)
Mean Plasma Glucose: 145.59 mg/dL

## 2019-07-20 LAB — CLOSTRIDIUM DIFFICILE BY PCR, REFLEXED: Toxigenic C. Difficile by PCR: POSITIVE — AB

## 2019-07-20 MED ORDER — NYSTATIN 100000 UNIT/GM EX POWD
Freq: Three times a day (TID) | CUTANEOUS | Status: DC
  Filled 2019-07-20 (×3): qty 15

## 2019-07-20 MED ORDER — OXYCODONE HCL 5 MG PO TABS
15.0000 mg | ORAL_TABLET | Freq: Four times a day (QID) | ORAL | Status: DC | PRN
  Filled 2019-07-20: qty 3

## 2019-07-20 MED ORDER — VANCOMYCIN 50 MG/ML ORAL SOLUTION
125.0000 mg | Freq: Four times a day (QID) | ORAL | Status: DC
  Administered 2019-07-20 – 2019-07-22 (×9): 125 mg via ORAL
  Filled 2019-07-20 (×16): qty 2.5

## 2019-07-20 MED ORDER — WARFARIN SODIUM 5 MG PO TABS
5.0000 mg | ORAL_TABLET | Freq: Once | ORAL | Status: AC
  Administered 2019-07-20: 5 mg via ORAL
  Filled 2019-07-20: qty 1

## 2019-07-20 MED ORDER — FOSFOMYCIN TROMETHAMINE 3 G PO PACK
3.0000 g | PACK | Freq: Once | ORAL | Status: AC
  Administered 2019-07-20: 3 g via ORAL
  Filled 2019-07-20: qty 3

## 2019-07-20 MED ORDER — MORPHINE SULFATE (CONCENTRATE) 10 MG/0.5ML PO SOLN
10.0000 mg | ORAL | Status: DC | PRN
  Administered 2019-07-20 – 2019-07-21 (×5): 10 mg via ORAL
  Filled 2019-07-20 (×5): qty 0.5

## 2019-07-20 MED ORDER — CLOMIPRAMINE HCL 25 MG PO CAPS
25.0000 mg | ORAL_CAPSULE | Freq: Four times a day (QID) | ORAL | Status: DC
  Administered 2019-07-20 – 2019-07-22 (×8): 25 mg via ORAL
  Filled 2019-07-20 (×13): qty 1

## 2019-07-20 MED ORDER — INSULIN GLARGINE 100 UNIT/ML ~~LOC~~ SOLN
15.0000 [IU] | Freq: Every day | SUBCUTANEOUS | Status: DC
  Administered 2019-07-20 – 2019-07-22 (×3): 15 [IU] via SUBCUTANEOUS
  Filled 2019-07-20 (×4): qty 0.15

## 2019-07-20 MED ORDER — CHLORHEXIDINE GLUCONATE CLOTH 2 % EX PADS
6.0000 | MEDICATED_PAD | Freq: Every day | CUTANEOUS | Status: DC
  Administered 2019-07-20 – 2019-07-21 (×2): 6 via TOPICAL

## 2019-07-20 NOTE — Progress Notes (Signed)
Spoke over the phone with patient's daughter, Philis Fendt. She verbalized and verified with this RN that patient has a DNR code status. Patient's granddaughter will bring DNR in today. MD made aware.

## 2019-07-20 NOTE — Progress Notes (Signed)
CRITICAL VALUE ALERT  Critical Value:  C. Diff positive  Date & Time Notied:  07/20/19 2574  Provider Notified: Dr. Laural Benes  Orders Received/Actions taken: no new orders, patient on precautions and PO vancomycin

## 2019-07-20 NOTE — TOC Initial Note (Signed)
Transition of Care [facility]) - Initial/Assessment Note    Patient Details  Name: Rebecca [name] MRN: [id number] Date of Birth: Nov 10, 1940  [facility]) CM/SW Contact:    Karn Cassis, LCSW Phone Number: 07/20/2019, 8:23 AM  Clinical Narrative:   LCSW spoke with pt's granddaughter, [name] who reports she is pt's primary caregiver at home. [name] states pt has hospice services through [facility] [place]. Per [name], pt's daughter, [name] (Rebecca's mother) is HCPOA and pt's son, Molly Maduro is financial POA. They had started to talk to hospice about transitioning to [facility], but no decision had been made yet. [name] indicates Hospice RN recommended going to hospital yesterday. TOC will continue to follow and address any d/c planning needs.               Expected Discharge Plan: Home w Hospice Care Barriers to Discharge: Continued Medical Work up   Patient Goals and CMS Choice Patient states their goals for this hospitalization and ongoing recovery are:: return home with continued hospice services      Expected Discharge Plan and Services Expected Discharge Plan: Home w Hospice Care In-house Referral: Clinical Social Work     Living arrangements for the past 2 months: Single Family Home                                      Prior Living Arrangements/Services Living arrangements for the past 2 months: Single Family Home Lives with:: Relatives Patient language and need for interpreter reviewed:: Yes Do you feel safe going back to the place where you live?: Yes      Need for Family Participation in Patient Care: Yes (Comment) Care giver support system in place?: Yes (comment) Current home services: Other (comment) (provided by hospice) Criminal Activity/Legal Involvement Pertinent to Current Situation/Hospitalization: No - Comment as needed  Activities of Daily Living   ADL Screening (condition at time of admission) Patient's cognitive  ability adequate to safely complete daily activities?: No Is the patient deaf or have difficulty hearing?: Yes Does the patient have difficulty seeing, even when wearing glasses/contacts?: No Does the patient have difficulty concentrating, remembering, or making decisions?: Yes Patient able to express need for assistance with ADLs?: No Does the patient have difficulty dressing or bathing?: Yes Independently performs ADLs?: No Communication: Independent Dressing (OT): Dependent Is this a change from baseline?: Pre-admission baseline Grooming: Dependent Is this a change from baseline?: Pre-admission baseline Feeding: Needs assistance Is this a change from baseline?: Pre-admission baseline Bathing: Dependent Is this a change from baseline?: Pre-admission baseline Toileting: Dependent Is this a change from baseline?: Pre-admission baseline In/Out Bed: Dependent Is this a change from baseline?: Pre-admission baseline Walks in Home: Dependent Is this a change from baseline?: Pre-admission baseline Does the patient have difficulty walking or climbing stairs?: Yes Weakness of Legs: Both Weakness of Arms/Hands: Both  Permission Sought/Granted   Permission granted to share information with : Yes, Verbal Permission Granted     Permission granted to share info w AGENCY: [facility]        Emotional Assessment Appearance:: Appears stated age Attitude/Demeanor/Rapport: Unable to Assess Affect (typically observed): Unable to Assess Orientation: : Oriented to Self, Oriented to Place Alcohol / Substance Use: Not Applicable Psych Involvement: No (comment)  Admission diagnosis:  Diarrhea of presumed infectious origin [R19.7] Confusion [R41.0] Acute urinary tract infection [N39.0] Urinary tract infection with hematuria,  site unspecified [N39.0, R31.9] Patient Active Problem List   Diagnosis Date Noted  . Clostridium difficile diarrhea 07/20/2019  . DNR (do not  resuscitate) 07/20/2019  . Acute urinary tract infection 07/19/2019  . Chronic combined systolic and diastolic congestive heart failure ([place]) 07/19/2019  . Palliative care by specialist   . Goals of care, counseling/discussion   . Acute on chronic respiratory failure with hypoxia ([place]) 01/01/2019  . Paroxysmal atrial fibrillation ([place]) 12/09/2018  . Acute on chronic respiratory failure ([place]) 12/09/2018  . Pneumonia due to COVID-19 virus 12/08/2018  . History of recurrent deep vein thrombosis (DVT)   . Type 2 diabetes mellitus ([place])   . Lobar pneumonia ([place]) 09/05/2018  . Acute on chronic diastolic CHF (congestive heart failure) ([place]) 09/05/2018  . CHF (congestive heart failure) ([place]) 09/04/2018  . HCAP (healthcare-associated pneumonia) 03/29/2017  . Cellulitis 03/03/2017  . Type 2 diabetes mellitus with other specified complication ([place]) 14/43/1540  . CKD (chronic kidney disease), stage III 03/03/2017  . Status post placement of cardiac pacemaker 10/29/2016  . Pulmonary edema 10/23/2016  . Acute respiratory failure with hypoxia ([place]) 02/22/2015  . CAP (community acquired pneumonia) 02/22/2015  . Fall at home 09/21/2014  . Mild aortic stenosis 09/08/2014  . Mild to moderate  mitral stenosis 09/08/2014  . Chronic anticoagulation-Coumadin 09/08/2014  . Sepsis ([place]) 09/05/2014  . Elevated troponin 09/05/2014  . Lower leg DVT (deep venous thromboembolism), chronic ([place]) 09/05/2014  . Essential hypertension 09/05/2014  . Depression 09/05/2014  . Poorly controlled diabetes mellitus ([place]) 09/05/2014  . Hypomagnesemia 09/05/2014  . Hyponatremia 09/05/2014  . Pyelonephritis 09/05/2014  . Chronic diastolic CHF (congestive heart failure) ([place])   . 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 GERD (gastroesophageal reflux disease) 07/08/2011  . Gastroparesis 07/08/2011  . IDA (iron deficiency anemia) 07/08/2011  . History of colonic polyps 07/08/2011  .  Narcolepsy-evaluated at [facility] 07/08/2011  . NAFLD (nonalcoholic fatty liver disease) 07/08/2011  . Obesity 07/08/2011   PCP:  Lucia Gaskins, MD Pharmacy:   [facility], [address] [place] [id number] Phone: [phone] Fax: [phone]  [place], [address] [place] [address] Phone: [phone] Fax: [phone]     Social Determinants of Health (SDOH) Interventions    Readmission Risk Interventions Readmission Risk Prevention Plan 01/04/2019  Transportation Screening Complete  HRI or Home Care Consult Complete  Social Work Consult for [place] Planning/Counseling Complete  Palliative Care Screening Not Applicable  Medication Review Press photographer) Complete  Some recent data might be hidden

## 2019-07-20 NOTE — Progress Notes (Addendum)
PROGRESS NOTE   Rebecca [name]  SHF:[id number] DOB: 1940-09-13 DOA: 07/19/2019 PCP: [name], MD   Chief Complaint  Patient presents with  . Diarrhea    Brief Admission History:  79 y.o. female with medical history significant of aortic stenosis, other cataplexy, diastolic heart failure, essential hypertension, GERD/hiatal hernia, history of recurrent DVT, other narcolepsy, peripheral neuropathy, unspecified chronic renal disease, type 2 diabetes, morbid obesity on home oxygen who is coming to the emergency department due to at least 5 days of loose stools multiple times a day after being on 2 courses of antibiotics (Bactrim and an unknown antibiotic) over the past 2 months for urine tract infection.  Earlier today, when she was evaluated by the hospice nurse, she noticed that the patient was confused and her stools smelled like previous patients with C. difficile colitis.  The patient is unable to provide further information at this time. Assessment & Plan:   Principal Problem:   Acute urinary tract infection Active Problems:   GERD (gastroesophageal reflux disease)   Essential hypertension   Depression   CKD (chronic kidney disease), stage III   History of recurrent deep vein thrombosis (DVT)   Type 2 diabetes mellitus (HCC)   Paroxysmal atrial fibrillation (HCC)   Chronic combined systolic and diastolic congestive heart failure (HCC)   Clostridium difficile diarrhea   DNR (do not resuscitate)   C. difficile diarrhea  1. C diff colitis and diarrhea - continue oral vancomycin treatments.  Flexiseal 6/15.  2. E coli UTI - treated with fosfomycin.  3. PAF - she is on carvedilol for rate control and warfarin for anticoagulation.  4. GERD - protonix ordered for GI protection.  5. Essential hypertension -stable, controlled.  6. Leukocytosis - secondary to C diff infection. WBC trending down.  7. Type 2 DM - I further reduce lantus to 15 units until there was more blood sugar  data available.  8. DNR - Pt is on home hospice.  Family deciding about residential hospice at this time.  9. H/o recurrent DVT - on warfarin, managed by pharmacy in hospital.  10. Skin - added flexiseal due to perineal skin breakdown with frequent diarrhea, heels with pressure damage, added prevalon boots for protection.  11. Chronic combined systolic and diastolic CHF - stable, compensated, holding home lasix temporarily while having severe diarrhea.   DVT prophylaxis:  Warfarin  Code Status:  DNR  Disposition:  TBD (home hospice vs residential hospice)  Status is: Inpatient  Remains inpatient appropriate because:Inpatient level of care appropriate due to severity of illness   Dispo: The patient is from: Home              Anticipated d/c is to: TBD              Anticipated d/c date is: 1 day              Patient currently is not medically stable to d/c.  Consultants:     Procedures:     Antimicrobials:  Oral vancomycin 6/14>>   Subjective: Pt appears comfortable. She is resting.   Objective: Vitals:   07/20/19 0519 07/20/19 0900 07/20/19 1300 07/20/19 1512  BP: 131/69 (!) 128/48  109/71  Pulse: 72 60 60 (!) 59  Resp: 20 (!) 24 20 20   Temp: 98.1 F (36.7 C)   99.4 F (37.4 C)  TempSrc: Oral   Oral  SpO2: 94% 98% 97% 100%  Weight:      Height:  Intake/Output Summary (Last 24 hours) at 07/20/2019 1647 Last data filed at 07/20/2019 0600 Gross per 24 hour  Intake 160 ml  Output 300 ml  Net -140 ml   Filed Weights   07/19/19 1357  Weight: 90 kg    Examination:  General exam: Appears calm and comfortable, NAD.  Respiratory system: shallow BS bilateral. No rales heard.  Respiratory effort normal. Cardiovascular system: normal S1 & S2 heard.  Gastrointestinal system: Abdomen is nondistended, soft and nontender. No organomegaly or masses felt. Normal bowel sounds heard. Central nervous system: somnolent. Nonfocal. Extremities: no gross lesions seen  today.  Skin: No rashes, lesions or ulcers Psychiatry: somnolent, unable to observe today.     Data Reviewed: I have personally reviewed following labs and imaging studies  CBC: Recent Labs  Lab 07/19/19 1532 07/20/19 0530  WBC 14.0* 11.5*  NEUTROABS 11.1* 8.6*  HGB 12.3 11.8*  HCT 38.3 37.1  MCV 91.8 92.8  PLT 351 325    Basic Metabolic Panel: Recent Labs  Lab 07/19/19 1532 07/20/19 0530  NA 132* 134*  K 4.2 4.1  CL 92* 94*  CO2 29 28  GLUCOSE 201* 166*  BUN 28* 30*  CREATININE 0.81 0.86  CALCIUM 9.1 9.3  MG 2.0  --     GFR: Estimated Creatinine Clearance: 60 mL/min (by C-G formula based on SCr of 0.86 mg/dL).  Liver Function Tests: Recent Labs  Lab 07/19/19 1532 07/20/19 0530  AST 20 19  ALT 18 18  ALKPHOS 55 55  BILITOT 0.8 1.0  PROT 6.9 6.8  ALBUMIN 2.7* 2.8*    CBG: Recent Labs  Lab 07/19/19 2240 07/20/19 0740 07/20/19 1200  GLUCAP 156* 166* 121*    Recent Results (from the past 240 hour(s))  C Difficile Quick Screen w PCR reflex     Status: Abnormal   Collection Time: 07/19/19  4:41 PM   Specimen: STOOL  Result Value Ref Range Status   C Diff antigen POSITIVE (A) NEGATIVE Final   C Diff toxin NEGATIVE NEGATIVE Final   C Diff interpretation Results are indeterminate. See PCR results.  Final    Comment: Performed at [facility], [address], [place], [place] [address]  C. Diff by PCR, Reflexed     Status: Abnormal   Collection Time: 07/19/19  4:41 PM  Result Value Ref Range Status   Toxigenic C. Difficile by PCR POSITIVE (A) NEGATIVE Final    Comment: Positive for toxigenic C. difficile with little to no toxin production. Only treat if clinical presentation suggests symptomatic illness. Performed at [facility], [address], [place], [place] [address]   SARS Coronavirus 2 by RT PCR (hospital order, performed in [facility] hospital lab) Nasopharyngeal Nasopharyngeal Swab     Status: None   Collection Time: 07/19/19  7:26 PM    Specimen: Nasopharyngeal Swab  Result Value Ref Range Status   SARS Coronavirus 2 NEGATIVE NEGATIVE Final    Comment: (NOTE) SARS-CoV-2 target nucleic acids are NOT DETECTED.  The SARS-CoV-2 RNA is generally detectable in upper and lower respiratory specimens during the acute phase of infection. The lowest concentration of SARS-CoV-2 viral copies this assay can detect is 250 copies / mL. A negative result does not preclude SARS-CoV-2 infection and should not be used as the sole basis for treatment or other patient management decisions.  A negative result may occur with improper specimen collection / handling, submission of specimen other than nasopharyngeal swab, presence of viral mutation(s) within the  areas targeted by this assay, and inadequate number of viral copies (<250 copies / mL). A negative result must be combined with clinical observations, patient history, and epidemiological information.  Fact Sheet for Patients:   StrictlyIdeas.no  Fact Sheet for Healthcare Providers: BankingDealers.co.za  This test is not yet approved or  cleared by the [place] FDA and has been authorized for detection and/or diagnosis of SARS-CoV-2 by FDA under an Emergency Use Authorization (EUA).  This EUA will remain in effect (meaning this test can be used) for the duration of the COVID-19 declaration under Section 564(b)(1) of the Act, 21 U.S.C. section 360bbb-3(b)(1), unless the authorization is terminated or revoked sooner.  Performed at [facility], [address], [place], [place] [address]      Radiology Studies: CT Head Wo Contrast  Result Date: 07/19/2019 CLINICAL DATA:  Encephalopathy.  Headache EXAM: CT HEAD WITHOUT CONTRAST TECHNIQUE: Contiguous axial images were obtained from the base of the skull through the vertex without intravenous contrast. COMPARISON:  CT head 03/24/2019 FINDINGS: Brain: Moderate atrophy with  enlargement of the ventricles and subarachnoid space, similar to the prior study. Moderate hypodensity throughout the cerebral white matter unchanged from the prior study. Negative for acute infarct, hemorrhage, mass. Vascular: Negative for hyperdense vessel. Atherosclerotic calcification right middle cerebral artery and in the cavernous carotid bilaterally. Skull: Negative Sinuses/Orbits: Paranasal sinuses clear.  No orbital mass. Other: None IMPRESSION: Moderate atrophy and moderate chronic microvascular ischemic change. No acute abnormality and no change from the prior CT. Electronically Signed   By: Franchot Gallo M.D.   On: 07/19/2019 18:39   DG Chest Port 1 View  Result Date: 07/19/2019 CLINICAL DATA:  Confusion. EXAM: PORTABLE CHEST 1 VIEW COMPARISON:  Chest x-ray and chest CT dated 03/24/2019 FINDINGS: Heart size is within normal limits considering the portable technique. Pulmonary vascularity is normal. Pacemaker in place. Slight chronic accentuation of the interstitial markings. No acute infiltrates or effusions. No acute bone abnormality. IMPRESSION: No acute abnormalities. Electronically Signed   By: Lorriane Shire M.D.   On: 07/19/2019 17:44     Scheduled Meds: . carvedilol  3.125 mg Oral BID WC  . Chlorhexidine Gluconate Cloth  6 each Topical Daily  . clomiPRAMINE  25 mg Oral QID  . clonazePAM  1 mg Oral QHS  . DULoxetine  100 mg Oral Daily  . ferrous sulfate  325 mg Oral BID WC  . gabapentin  300 mg Oral BID  . insulin aspart  0-15 Units Subcutaneous TID WC  . insulin glargine  15 Units Subcutaneous Daily  . lidocaine  1 patch Transdermal QHS  . magnesium oxide  400 mg Oral Daily  . nystatin   Topical TID  . potassium chloride  10 mEq Oral Daily  . QUEtiapine  100 mg Oral QHS  . vancomycin  125 mg Oral QID  . Warfarin - Pharmacist Dosing Inpatient   Does not apply q1600   Continuous Infusions:   LOS: 0 days   Time spent: 15 mins   Marelin Tat Wynetta Emery, MD How to contact  the [facility] Attending or Consulting provider [place] or covering provider during after hours [place], for this patient?  1. Check the care team in [facility] and look for a) attending/consulting [facility] provider listed and b) the [facility] team listed 2. Log into [email or web address] and use [facility]'s universal password to access. If you do not have the password, please contact the hospital operator. 3. Locate the [facility] provider you are looking  for under [facility] and page to a number that you can be directly reached. 4. If you still have difficulty reaching the provider, please page the [facility] (Director on Call) for the Hospitalists listed on amion for assistance.  07/20/2019, 4:47 PM

## 2019-07-20 NOTE — Progress Notes (Signed)
ANTICOAGULATION CONSULT NOTE -   Pharmacy Consult for Warfarin Indication: atrial fibrillation and history of recurrent DVTs  Allergies  Allergen Reactions  . Codeine Anxiety  . Compazine Anaphylaxis  . Other Anaphylaxis, Rash and Other (See Comments)    Uncoded Allergy. Allergen: INOVAR Uncoded Allergy. Allergen: ALL ADHESIVES Uncoded Allergy. Allergen: SILK SUTURE Uncoded Allergy. Allergen: merthiolate Thermasol  . Penicillins Shortness Of Breath and Rash    Has patient had a PCN reaction causing immediate rash, facial/tongue/throat swelling, SOB or lightheadedness with hypotension: Yes Has patient had a PCN reaction causing severe rash involving mucus membranes or skin necrosis: No Has patient had a PCN reaction that required hospitalization No Has patient had a PCN reaction occurring within the last 10 years: Yes If all of the above answers are "NO", then may proceed with Cephalosporin use.   Marland Kitchen Morphine And Related Other (See Comments)    Patient states that it makes her lose her state of mind.   . Demerol [Meperidine] Rash    Patient Measurements: Height: 5\' 6"  (167.6 cm) Weight: 90 kg (198 lb 6.6 oz) IBW/kg (Calculated) : 59.3  Vital Signs: Temp: 98.1 F (36.7 C) (06/15 0519) Temp Source: Oral (06/15 0519) BP: 128/48 (06/15 0900) Pulse Rate: 60 (06/15 0900)  Labs: Recent Labs    07/19/19 1532 07/20/19 0530  HGB 12.3 11.8*  HCT 38.3 37.1  PLT 351 325  LABPROT 15.2 16.3*  INR 1.2 1.4*  CREATININE 0.81 0.86    Estimated Creatinine Clearance: 60 mL/min (by C-G formula based on SCr of 0.86 mg/dL).   Medical History: Past Medical History:  Diagnosis Date  . Aortic stenosis   . Cataplexy   . Diastolic heart failure (HCC)   . Essential hypertension   . GERD (gastroesophageal reflux disease)   . Hiatal hernia   . History of recurrent deep vein thrombosis (DVT)   . Mitral stenosis   . Narcolepsy   . Neuropathy   . Renal disorder   . Type 2 diabetes  mellitus (HCC)     Medications:  Medications Prior to Admission  Medication Sig Dispense Refill Last Dose  . carvedilol (COREG) 3.125 MG tablet Take 1 tablet (3.125 mg total) by mouth 2 (two) times daily with a meal. 180 tablet 3 07/19/2019 at 2100  . clomiPRAMINE (ANAFRANIL) 25 MG capsule Take 1 capsule (25 mg total) by mouth 2 (two) times daily. (Patient taking differently: Take 25 mg by mouth in the morning, at noon, in the evening, and at bedtime. ) 60 capsule 2 07/19/2019 at Unknown time  . clonazePAM (KLONOPIN) 1 MG tablet Take 1 tablet (1 mg total) by mouth at bedtime.   07/19/2019 at Unknown time  . DULoxetine (CYMBALTA) 60 MG capsule Take 100 mg by mouth daily.    07/19/2019 at Unknown time  . ferrous sulfate 325 (65 FE) MG tablet Take 325 mg by mouth 2 (two) times daily with a meal.    07/19/2019 at Unknown time  . furosemide (LASIX) 20 MG tablet Take 1 tablet (20 mg total) by mouth 2 (two) times daily. 180 tablet 3 07/19/2019 at Unknown time  . gabapentin (NEURONTIN) 300 MG capsule Take 300 mg by mouth 2 (two) times daily.   07/19/2019 at Unknown time  . insulin glargine (LANTUS) 100 unit/mL SOPN Inject 0.2 mLs (20 Units total) into the skin daily. 15 mL 11 07/19/2019 at Unknown time  . magnesium oxide (MAG-OX) 400 MG tablet Take 400 mg by mouth daily.   07/19/2019  at Unknown time  . metFORMIN (GLUCOPHAGE) 500 MG tablet Take 500 mg by mouth 2 (two) times daily with a meal.   07/19/2019 at Unknown time  . Morphine Sulfate (MORPHINE CONCENTRATE) 10 mg / 0.5 ml concentrated solution Take 10 mg by mouth.    07/19/2019 at Unknown time  . oxyCODONE (ROXICODONE) 15 MG immediate release tablet Take 15 mg by mouth 3 (three) times daily.   07/19/2019 at Unknown time  . potassium chloride (KLOR-CON) 10 MEQ tablet Take 1 tablet (10 mEq total) by mouth daily. 90 tablet 3 07/19/2019 at Unknown time  . QUEtiapine (SEROQUEL) 100 MG tablet Take 100 mg by mouth at bedtime.   07/19/2019 at Unknown time  . warfarin  (COUMADIN) 5 MG tablet Take 2.5 mg by mouth daily.    07/19/2019 at 0900  . clobetasol cream (TEMOVATE) 1.63 % Apply 1 application topically daily.  (Patient not taking: Reported on 07/20/2019)   Not Taking at Unknown time  . diclofenac Sodium (VOLTAREN) 1 % GEL Apply 1 application topically daily as needed. (Patient not taking: Reported on 07/20/2019)   Not Taking at Unknown time  . lidocaine (LIDODERM) 5 % Place 1 patch onto the skin daily. Remove & Discard patch within 12 hours or as directed by MD (Patient not taking: Reported on 07/20/2019) 30 patch 0 Not Taking at Unknown time    Assessment: 79 year old female with a history of atrial fibrillation and recurrent DVTs on Warfarin was admitted on 07/19/19 with diarrhea for 5 days and concern for possible UTI. Patient's CBC is within normal limits. No bleeding reported.  Admission INR is sub-therapeutic at 1.2 (goal 2-3). Last dose of Warfarin was 6/14 at 0900 2.5mg .  Home regimen is 2.5mg  po daily. INR 1.4 today, will give 1 booster dose  Goal of Therapy:  INR 2-3 Monitor platelets by anticoagulation protocol: Yes   Plan:  Coumadin 5mg  x 1 today  Daily PT/INR Monitor for drug interactions or signs and symptoms of bleeding.   Isac Sarna, BS Vena [place], BCPS Clinical Pharmacist Pager [phone] 07/20/2019,10:44 AM

## 2019-07-21 DIAGNOSIS — N1831 Chronic kidney disease, stage 3a: Secondary | ICD-10-CM

## 2019-07-21 DIAGNOSIS — I480 Paroxysmal atrial fibrillation: Secondary | ICD-10-CM

## 2019-07-21 LAB — CBC WITH DIFFERENTIAL/PLATELET
Abs Immature Granulocytes: 0.03 10*3/uL (ref 0.00–0.07)
Basophils Absolute: 0.1 10*3/uL (ref 0.0–0.1)
Basophils Relative: 0 %
Eosinophils Absolute: 0.4 10*3/uL (ref 0.0–0.5)
Eosinophils Relative: 4 %
HCT: 36.8 % (ref 36.0–46.0)
Hemoglobin: 11.9 g/dL — ABNORMAL LOW (ref 12.0–15.0)
Immature Granulocytes: 0 %
Lymphocytes Relative: 13 %
Lymphs Abs: 1.5 10*3/uL (ref 0.7–4.0)
MCH: 29.8 pg (ref 26.0–34.0)
MCHC: 32.3 g/dL (ref 30.0–36.0)
MCV: 92.2 fL (ref 80.0–100.0)
Monocytes Absolute: 0.9 10*3/uL (ref 0.1–1.0)
Monocytes Relative: 8 %
Neutro Abs: 8.3 10*3/uL — ABNORMAL HIGH (ref 1.7–7.7)
Neutrophils Relative %: 75 %
Platelets: 343 10*3/uL (ref 150–400)
RBC: 3.99 MIL/uL (ref 3.87–5.11)
RDW: 15.9 % — ABNORMAL HIGH (ref 11.5–15.5)
WBC: 11.2 10*3/uL — ABNORMAL HIGH (ref 4.0–10.5)
nRBC: 0 % (ref 0.0–0.2)

## 2019-07-21 LAB — BASIC METABOLIC PANEL
Anion gap: 14 (ref 5–15)
BUN: 28 mg/dL — ABNORMAL HIGH (ref 8–23)
CO2: 27 mmol/L (ref 22–32)
Calcium: 9.5 mg/dL (ref 8.9–10.3)
Chloride: 93 mmol/L — ABNORMAL LOW (ref 98–111)
Creatinine, Ser: 0.85 mg/dL (ref 0.44–1.00)
GFR calc Af Amer: >60 mL/min (ref >60)
GFR calc non Af Amer: >60 mL/min (ref >60)
Glucose, Bld: 123 mg/dL — ABNORMAL HIGH (ref 70–99)
Potassium: 4.4 mmol/L (ref 3.5–5.1)
Sodium: 134 mmol/L — ABNORMAL LOW (ref 135–145)

## 2019-07-21 LAB — GLUCOSE, CAPILLARY: Glucose-Capillary: 231 mg/dL — ABNORMAL HIGH (ref 70–99)

## 2019-07-21 LAB — PROTIME-INR
INR: 1.4 — ABNORMAL HIGH (ref 0.8–1.2)
Prothrombin Time: 17 seconds — ABNORMAL HIGH (ref 11.4–15.2)

## 2019-07-21 MED ORDER — WARFARIN SODIUM 2 MG PO TABS
4.0000 mg | ORAL_TABLET | Freq: Once | ORAL | Status: AC
  Administered 2019-07-21: 4 mg via ORAL
  Filled 2019-07-21: qty 2

## 2019-07-21 NOTE — Progress Notes (Signed)
PROGRESS NOTE  ENEDELIA [name] ZTI:[id number] DOB: 30-Jun-1940 DOA: 07/19/2019 PCP: [name], MD  Brief History:  79 y/o female with a history of diastolic CHF, aortic stenosis, mitral stenosis, narcolepsy, CKD stage III, hypertension, recurrent lower extremity DVT, diabetes mellitus, complete heart block status post permanent pacemaker presenting with diarrhea and confusion for least the last 2 days prior to admission.  Her hospice nurse visited the patient this morning who confirmed her diarrhea smelled like C. difficile, hence transfer here for further evaluation.  Granddaughter also indicates that the patient is generally awake and alert, although does sleep frequently secondary to being on pain medication managed through hospice.  Patient is a poor historian.  At baseline, patient is A&Ox1 and requires assistance with all ADLs including feeding.  Assessment/Plan: C diff colitis -present prior to admission -presented with diarrhea, abd pain and leukocytosis in the setting of recent abx usage -continue vanco po -only 2 BM in last 24 hours  CAUTI -pt has foley at home--placed 2 weeks ago due to skin breakdown -6/14 UA >50WBC -urine culture [facility] -received fosfomycin 6/15  chronic systolic and diastolic CHF -09/04/2018 echo EF 40-45%, HK LV apex,severe LAE, moderate to severeMS,mild to moderateAS -09/07/18-repeat limited echo EF 55-60%, impaired relaxation -appears euvolemic -resume po lasix -Daily weights -12/15/2017 office weight 235 pounds -09/07/18 discharge weight 238.7 lb -continue carvedilol  Chronic respiratory failure with hypoxia -She is chronically on 2-3 L at home -Stable presently -Personally reviewed chest x-ray--no edema or consolidation  diabetes mellitus type 2 with hyperglycemia -09/04/18 Hemoglobin A1c 9.2 -07/20/19 A1C--6.7 -continue reduced dose lantus -novolog sliding scale  CKD stage III -Baseline creatinine 0.8-1.2 -Monitor  clinically with lasix  Complete heart block -Status post PPM -personally reviewed EKG--paced rhythm  Hyponatremia -Secondary to poor po intake -Improving with furosemide  Failure to Thrive -pt has had gradual functional and cognitive decline last 6 months per son -she is bedbound -she requires assistance with meals -her po intake is poor at home even with assistance -consult palliative medicine  Recurrent DVT -Continue warfarin  Depression -continue Cymbalta  Goals of Care -consult palliative medicine -DNR -pt continues to be brought into hospital ever 4-6 weeks     Status is: Inpatient  Remains inpatient appropriate because:altered mental status  Dispo: The patient is from: Home              Anticipated d/c is to: Home              Anticipated d/c date is: 1 day              Patient currently is not medically stable to d/c.        Family Communication:   Updated son 6/16  Consultants:  Palliative medicine  Code Status:  DNR  DVT Prophylaxis:  warfarin   Procedures: As Listed in Progress Note Above  Antibiotics: Fosfomycin 6/15 vanco po 6/15>>   Total time spent 35 minutes.  Greater than 50% spent face to face counseling and coordinating care.      Subjective: Patient is pleasantly confused.  He is able to awaken and answer questions.  She denies any pain, shortness breath, cough, abdominal pain.  She continues to have loose stool.  Denies any headache, fevers, chills  Objective: Vitals:   07/20/19 2200 07/21/19 0030 07/21/19 0439 07/21/19 0836  BP:  (!) 106/54 (!) 105/50 (!) 148/71  Pulse:  (!) 59 60 60  Resp:  20 20   Temp: 98.8 F (37.1 C) 99.5 F (37.5 C) 98.2 F (36.8 C)   TempSrc: Axillary Axillary Oral   SpO2:  95% 100%   Weight:      Height:        Intake/Output Summary (Last 24 hours) at 07/21/2019 1517 Last data filed at 07/21/2019 1305 Gross per 24 hour  Intake 480 ml  Output 150 ml  Net 330 ml   Weight  change:  Exam:   General:  Pt is alert, follows commands appropriately, not in acute distress  HEENT: No icterus, No thrush, No neck mass, [place]/AT  Cardiovascular: RRR, S1/S2, no rubs, no gallops  Respiratory: Bibasilar crackles but no wheezing.  Good air movement  Abdomen: Soft/+BS, non tender, non distended, no guarding  Extremities: No edema, No lymphangitis, No petechiae, No rashes, no synovitis   Data Reviewed: I have personally reviewed following labs and imaging studies Basic Metabolic Panel: Recent Labs  Lab 07/19/19 1532 07/20/19 0530 07/21/19 0634  NA 132* 134* 134*  K 4.2 4.1 4.4  CL 92* 94* 93*  CO2 29 28 27   GLUCOSE 201* 166* 123*  BUN 28* 30* 28*  CREATININE 0.81 0.86 0.85  CALCIUM 9.1 9.3 9.5  MG 2.0  --   --    Liver Function Tests: Recent Labs  Lab 07/19/19 1532 07/20/19 0530  AST 20 19  ALT 18 18  ALKPHOS 55 55  BILITOT 0.8 1.0  PROT 6.9 6.8  ALBUMIN 2.7* 2.8*   Recent Labs  Lab 07/19/19 1532  LIPASE 15   No results for input(s): AMMONIA in the last 168 hours. Coagulation Profile: Recent Labs  Lab 07/19/19 1532 07/20/19 0530 07/21/19 0634  INR 1.2 1.4* 1.4*   CBC: Recent Labs  Lab 07/19/19 1532 07/20/19 0530 07/21/19 0634  WBC 14.0* 11.5* 11.2*  NEUTROABS 11.1* 8.6* 8.3*  HGB 12.3 11.8* 11.9*  HCT 38.3 37.1 36.8  MCV 91.8 92.8 92.2  PLT 351 325 343   Cardiac Enzymes: No results for input(s): CKTOTAL, CKMB, CKMBINDEX, TROPONINI in the last 168 hours. BNP: Invalid input(s): POCBNP CBG: Recent Labs  Lab 07/19/19 2240 07/20/19 0740 07/20/19 1200 07/20/19 1713 07/20/19 2051  GLUCAP 156* 166* 121* 147* 122*   HbA1C: Recent Labs    07/20/19 0530  HGBA1C 6.7*   Urine analysis:    Component Value Date/Time   COLORURINE YELLOW 07/19/2019 1548   APPEARANCEUR CLOUDY (A) 07/19/2019 1548   LABSPEC 1.021 07/19/2019 1548   PHURINE 5.0 07/19/2019 1548   GLUCOSEU NEGATIVE 07/19/2019 1548   HGBUR MODERATE (A)  07/19/2019 1548   BILIRUBINUR NEGATIVE 07/19/2019 1548   KETONESUR NEGATIVE 07/19/2019 1548   PROTEINUR 100 (A) 07/19/2019 1548   UROBILINOGEN 0.2 09/05/2014 2015   NITRITE NEGATIVE 07/19/2019 1548   LEUKOCYTESUR MODERATE (A) 07/19/2019 1548   Sepsis Labs: @LABRCNTIP (procalcitonin:4,lacticidven:4) ) Recent Results (from the past 240 hour(s))  Urine Culture     Status: Abnormal (Preliminary result)   Collection Time: 07/19/19  3:48 PM   Specimen: Urine, Random  Result Value Ref Range Status   Specimen Description   Final    URINE, RANDOM Performed at [facility], [address], [place], [place] [address]    Special Requests   Final    NONE Performed at [facility], [address] [place], [place] [address]    Culture (A)  Final    >=100,000 COLONIES/mL ESCHERICHIA COLI SUSCEPTIBILITIES TO FOLLOW Performed at [facility], [place] [address]  [place], [place] [address]    Report Status PENDING  Incomplete  C Difficile Quick Screen w PCR reflex     Status: Abnormal   Collection Time: 07/19/19  4:41 PM   Specimen: STOOL  Result Value Ref Range Status   C Diff antigen POSITIVE (A) NEGATIVE Final   C Diff toxin NEGATIVE NEGATIVE Final   C Diff interpretation Results are indeterminate. See PCR results.  Final    Comment: Performed at [facility], [address], [place], [place] [address]  C. Diff by PCR, Reflexed     Status: Abnormal   Collection Time: 07/19/19  4:41 PM  Result Value Ref Range Status   Toxigenic C. Difficile by PCR POSITIVE (A) NEGATIVE Final    Comment: Positive for toxigenic C. difficile with little to no toxin production. Only treat if clinical presentation suggests symptomatic illness. Performed at [facility], [address], [place], [place] [address]   SARS Coronavirus 2 by RT PCR (hospital order, performed in [facility] hospital lab) Nasopharyngeal Nasopharyngeal Swab     Status: None   Collection Time: 07/19/19  7:26 PM   Specimen:  Nasopharyngeal Swab  Result Value Ref Range Status   SARS Coronavirus 2 NEGATIVE NEGATIVE Final    Comment: (NOTE) SARS-CoV-2 target nucleic acids are NOT DETECTED.  The SARS-CoV-2 RNA is generally detectable in upper and lower respiratory specimens during the acute phase of infection. The lowest concentration of SARS-CoV-2 viral copies this assay can detect is 250 copies / mL. A negative result does not preclude SARS-CoV-2 infection and should not be used as the sole basis for treatment or other patient management decisions.  A negative result may occur with improper specimen collection / handling, submission of specimen other than nasopharyngeal swab, presence of viral mutation(s) within the areas targeted by this assay, and inadequate number of viral copies (<250 copies / mL). A negative result must be combined with clinical observations, patient history, and epidemiological information.  Fact Sheet for Patients:   BoilerBrush.com.cy  Fact Sheet for Healthcare Providers: https://pope.com/  This test is not yet approved or  cleared by the [place] FDA and has been authorized for detection and/or diagnosis of SARS-CoV-2 by FDA under an Emergency Use Authorization (EUA).  This EUA will remain in effect (meaning this test can be used) for the duration of the COVID-19 declaration under Section 564(b)(1) of the Act, 21 U.S.C. section 360bbb-3(b)(1), unless the authorization is terminated or revoked sooner.  Performed at [facility], [address], [place], [place] [address]      Scheduled Meds: . carvedilol  3.125 mg Oral BID WC  . Chlorhexidine Gluconate Cloth  6 each Topical Daily  . clomiPRAMINE  25 mg Oral QID  . clonazePAM  1 mg Oral QHS  . DULoxetine  100 mg Oral Daily  . ferrous sulfate  325 mg Oral BID WC  . gabapentin  300 mg Oral BID  . insulin aspart  0-15 Units Subcutaneous TID WC  . insulin glargine  15 Units  Subcutaneous Daily  . lidocaine  1 patch Transdermal QHS  . magnesium oxide  400 mg Oral Daily  . nystatin   Topical TID  . potassium chloride  10 mEq Oral Daily  . QUEtiapine  100 mg Oral QHS  . vancomycin  125 mg Oral QID  . warfarin  4 mg Oral ONCE-1600  . Warfarin - Pharmacist Dosing Inpatient   Does not apply q1600   Continuous Infusions:  Procedures/Studies:  CT Head Wo Contrast  Result Date: 07/19/2019 CLINICAL DATA:  Encephalopathy.  Headache EXAM: CT HEAD WITHOUT CONTRAST TECHNIQUE: Contiguous axial images were obtained from the base of the skull through the vertex without intravenous contrast. COMPARISON:  CT head 03/24/2019 FINDINGS: Brain: Moderate atrophy with enlargement of the ventricles and subarachnoid space, similar to the prior study. Moderate hypodensity throughout the cerebral white matter unchanged from the prior study. Negative for acute infarct, hemorrhage, mass. Vascular: Negative for hyperdense vessel. Atherosclerotic calcification right middle cerebral artery and in the cavernous carotid bilaterally. Skull: Negative Sinuses/Orbits: Paranasal sinuses clear.  No orbital mass. Other: None IMPRESSION: Moderate atrophy and moderate chronic microvascular ischemic change. No acute abnormality and no change from the prior CT. Electronically Signed   By: Marlan Palau M.D.   On: 07/19/2019 18:39   DG Chest Port 1 View  Result Date: 07/19/2019 CLINICAL DATA:  Confusion. EXAM: PORTABLE CHEST 1 VIEW COMPARISON:  Chest x-ray and chest CT dated 03/24/2019 FINDINGS: Heart size is within normal limits considering the portable technique. Pulmonary vascularity is normal. Pacemaker in place. Slight chronic accentuation of the interstitial markings. No acute infiltrates or effusions. No acute bone abnormality. IMPRESSION: No acute abnormalities. Electronically Signed   By: Francene Boyers M.D.   On: 07/19/2019 17:44    Catarina Hartshorn, DO  [facility]  If 7PM-7AM, please contact  night-coverage [email or web address] Password [email or web address] 07/21/2019, 3:17 PM   LOS: 1 day

## 2019-07-21 NOTE — Progress Notes (Signed)
ANTICOAGULATION CONSULT NOTE -   Pharmacy Consult for Warfarin Indication: atrial fibrillation and history of recurrent DVTs  Allergies  Allergen Reactions  . Codeine Anxiety  . Compazine Anaphylaxis  . Other Anaphylaxis, Rash and Other (See Comments)    Uncoded Allergy. Allergen: INOVAR Uncoded Allergy. Allergen: ALL ADHESIVES Uncoded Allergy. Allergen: SILK SUTURE Uncoded Allergy. Allergen: merthiolate Thermasol  . Penicillins Shortness Of Breath and Rash    Has patient had a PCN reaction causing immediate rash, facial/tongue/throat swelling, SOB or lightheadedness with hypotension: Yes Has patient had a PCN reaction causing severe rash involving mucus membranes or skin necrosis: No Has patient had a PCN reaction that required hospitalization No Has patient had a PCN reaction occurring within the last 10 years: Yes If all of the above answers are "NO", then may proceed with Cephalosporin use.   Marland Kitchen Morphine And Related Other (See Comments)    Patient states that it makes her lose her state of mind.   . Demerol [Meperidine] Rash    Patient Measurements: Height: 5\' 6"  (167.6 cm) Weight: 90 kg (198 lb 6.6 oz) IBW/kg (Calculated) : 59.3  Vital Signs: Temp: 98.2 F (36.8 C) (06/16 0439) Temp Source: Oral (06/16 0439) BP: 105/50 (06/16 0439) Pulse Rate: 60 (06/16 0439)  Labs: Recent Labs    07/19/19 1532 07/19/19 1532 07/20/19 0530 07/21/19 0634  HGB 12.3   < > 11.8* 11.9*  HCT 38.3  --  37.1 36.8  PLT 351  --  325 343  LABPROT 15.2  --  16.3* 17.0*  INR 1.2  --  1.4* 1.4*  CREATININE 0.81  --  0.86 0.85   < > = values in this interval not displayed.    Estimated Creatinine Clearance: 60.7 mL/min (by C-G formula based on SCr of 0.85 mg/dL).   Medical History: Past Medical History:  Diagnosis Date  . Aortic stenosis   . Cataplexy   . Diastolic heart failure ([place])   . Essential hypertension   . GERD (gastroesophageal reflux disease)   . Hiatal hernia   .  History of recurrent deep vein thrombosis (DVT)   . Mitral stenosis   . Narcolepsy   . Neuropathy   . Renal disorder   . Type 2 diabetes mellitus (H[facility])     Medications:  Medications Prior to Admission  Medication Sig Dispense Refill Last Dose  . carvedilol (COREG) 3.125 MG tablet Take 1 tablet (3.125 mg total) by mouth 2 (two) times daily with a meal. 180 tablet 3 07/19/2019 at 2100  . clomiPRAMINE (ANAFRANIL) 25 MG capsule Take 1 capsule (25 mg total) by mouth 2 (two) times daily. (Patient taking differently: Take 25 mg by mouth in the morning, at noon, in the evening, and at bedtime. ) 60 capsule 2 07/19/2019 at Unknown time  . clonazePAM (KLONOPIN) 1 MG tablet Take 1 tablet (1 mg total) by mouth at bedtime.   07/19/2019 at Unknown time  . DULoxetine (CYMBALTA) 60 MG capsule Take 100 mg by mouth daily.    07/19/2019 at Unknown time  . ferrous sulfate 325 (65 FE) MG tablet Take 325 mg by mouth 2 (two) times daily with a meal.    07/19/2019 at Unknown time  . furosemide (LASIX) 20 MG tablet Take 1 tablet (20 mg total) by mouth 2 (two) times daily. 180 tablet 3 07/19/2019 at Unknown time  . gabapentin (NEURONTIN) 300 MG capsule Take 300 mg by mouth 2 (two) times daily.   07/19/2019 at Unknown time  .  insulin glargine (LANTUS) 100 unit/mL SOPN Inject 0.2 mLs (20 Units total) into the skin daily. 15 mL 11 07/19/2019 at Unknown time  . magnesium oxide (MAG-OX) 400 MG tablet Take 400 mg by mouth daily.   07/19/2019 at Unknown time  . metFORMIN (GLUCOPHAGE) 500 MG tablet Take 500 mg by mouth 2 (two) times daily with a meal.   07/19/2019 at Unknown time  . Morphine Sulfate (MORPHINE CONCENTRATE) 10 mg / 0.5 ml concentrated solution Take 10 mg by mouth.    07/19/2019 at Unknown time  . oxyCODONE (ROXICODONE) 15 MG immediate release tablet Take 15 mg by mouth 3 (three) times daily.   07/19/2019 at Unknown time  . potassium chloride (KLOR-CON) 10 MEQ tablet Take 1 tablet (10 mEq total) by mouth daily. 90 tablet 3  07/19/2019 at Unknown time  . QUEtiapine (SEROQUEL) 100 MG tablet Take 100 mg by mouth at bedtime.   07/19/2019 at Unknown time  . warfarin (COUMADIN) 2.5 MG tablet Take 2.5 mg by mouth daily.    07/19/2019 at 0900  . clobetasol cream (TEMOVATE) 0.05 % Apply 1 application topically daily.  (Patient not taking: Reported on 07/20/2019)   Not Taking at Unknown time  . diclofenac Sodium (VOLTAREN) 1 % GEL Apply 1 application topically daily as needed. (Patient not taking: Reported on 07/20/2019)   Not Taking at Unknown time  . lidocaine (LIDODERM) 5 % Place 1 patch onto the skin daily. Remove & Discard patch within 12 hours or as directed by MD (Patient not taking: Reported on 07/20/2019) 30 patch 0 Not Taking at Unknown time    Assessment: 79 year old female with a history of atrial fibrillation and recurrent DVTs on Warfarin was admitted on 07/19/19 with diarrhea for 5 days and concern for possible UTI. Patient's CBC is within normal limits. No bleeding reported.  Admission INR is sub-therapeutic at 1.2 (goal 2-3).  Home regimen is 2.5mg  po daily. INR 1.4 today  Goal of Therapy:  INR 2-3 Monitor platelets by anticoagulation protocol: Yes   Plan:  Coumadin 4 mg x 1 today  Daily PT/INR Monitor for drug interactions or signs and symptoms of bleeding.   Judeth Cornfield, PharmD Clinical Pharmacist 07/21/2019 8:10 AM

## 2019-07-22 LAB — BASIC METABOLIC PANEL
Anion gap: 12 (ref 5–15)
BUN: 24 mg/dL — ABNORMAL HIGH (ref 8–23)
CO2: 28 mmol/L (ref 22–32)
Calcium: 9.3 mg/dL (ref 8.9–10.3)
Chloride: 92 mmol/L — ABNORMAL LOW (ref 98–111)
Creatinine, Ser: 0.7 mg/dL (ref 0.44–1.00)
GFR calc Af Amer: >60 mL/min (ref >60)
GFR calc non Af Amer: >60 mL/min (ref >60)
Glucose, Bld: 137 mg/dL — ABNORMAL HIGH (ref 70–99)
Potassium: 4.3 mmol/L (ref 3.5–5.1)
Sodium: 132 mmol/L — ABNORMAL LOW (ref 135–145)

## 2019-07-22 LAB — URINE CULTURE: Culture: >=100000 — AB

## 2019-07-22 LAB — PROTIME-INR
INR: 1.6 — ABNORMAL HIGH (ref 0.8–1.2)
Prothrombin Time: 18.4 seconds — ABNORMAL HIGH (ref 11.4–15.2)

## 2019-07-22 LAB — CBC WITH DIFFERENTIAL/PLATELET
Abs Immature Granulocytes: 0.01 10*3/uL (ref 0.00–0.07)
Basophils Absolute: 0.1 10*3/uL (ref 0.0–0.1)
Basophils Relative: 1 %
Eosinophils Absolute: 0.4 10*3/uL (ref 0.0–0.5)
Eosinophils Relative: 4 %
HCT: 37 % (ref 36.0–46.0)
Hemoglobin: 11.7 g/dL — ABNORMAL LOW (ref 12.0–15.0)
Immature Granulocytes: 0 %
Lymphocytes Relative: 17 %
Lymphs Abs: 1.4 10*3/uL (ref 0.7–4.0)
MCH: 29 pg (ref 26.0–34.0)
MCHC: 31.6 g/dL (ref 30.0–36.0)
MCV: 91.6 fL (ref 80.0–100.0)
Monocytes Absolute: 0.9 10*3/uL (ref 0.1–1.0)
Monocytes Relative: 10 %
Neutro Abs: 5.9 10*3/uL (ref 1.7–7.7)
Neutrophils Relative %: 68 %
Platelets: 360 10*3/uL (ref 150–400)
RBC: 4.04 MIL/uL (ref 3.87–5.11)
RDW: 15.8 % — ABNORMAL HIGH (ref 11.5–15.5)
WBC: 8.6 10*3/uL (ref 4.0–10.5)
nRBC: 0 % (ref 0.0–0.2)

## 2019-07-22 LAB — GLUCOSE, CAPILLARY
Glucose-Capillary: 129 mg/dL — ABNORMAL HIGH (ref 70–99)
Glucose-Capillary: 117 mg/dL — ABNORMAL HIGH (ref 70–99)

## 2019-07-22 MED ORDER — VANCOMYCIN HCL 125 MG PO CAPS: 125.0000 mg | ORAL_CAPSULE | Freq: Four times a day (QID) | ORAL | 0 refills | Status: AC

## 2019-07-22 MED ORDER — WARFARIN SODIUM 5 MG PO TABS: 5.0000 mg | ORAL_TABLET | Freq: Once | ORAL | Status: DC

## 2019-07-22 MED ORDER — FOSFOMYCIN TROMETHAMINE 3 G PO PACK
3.0000 g | PACK | Freq: Once | ORAL | Status: AC
  Administered 2019-07-22: 3 g via ORAL
  Filled 2019-07-22: qty 3

## 2019-07-22 NOTE — Progress Notes (Signed)
Awaiting EMS for transport home. [name], Granddaughter, notified and requests call when patient leaves with EMS.

## 2019-07-22 NOTE — Discharge Summary (Signed)
Physician Discharge Summary  Rebecca [name] IPJ:[id number] DOB: 1940-09-14 DOA: 07/19/2019  PCP: Lucia Gaskins, MD  Admit date: 07/19/2019 Discharge date: 07/22/2019  Admitted From: Home Disposition:  Home   Recommendations for Outpatient Follow-up:  1. Follow up with PCP in 1-2 weeks 2. Please obtain BMP/CBC in one week   Home Health: Home with Hospice care   Discharge Condition: Stable CODE STATUS: DNR Diet recommendation:  Regular   Brief/Interim Summary: 62 y/ofemale with a history of diastolic CHF, aortic stenosis, mitral stenosis, narcolepsy, CKD stage III, hypertension, recurrent lower extremity DVT, diabetes mellitus, complete heart block status post permanent pacemaker presenting with diarrhea and confusion for least the last 2 days prior to admission.  Her hospice nurse visited the patient this morning who confirmed her diarrhea smelled like C. difficile, hence transfer here for further evaluation. Granddaughter also indicates that the patient is generally awake and alert, although does sleep frequently secondary to being on pain medication managed through hospice.  Patient is a poor historian.  At baseline, patient is A&Ox1 and requires assistance with all ADLs including feeding.  She is essentially bedbound.  Discharge Diagnoses:  C diff colitis -present prior to admission -presented with diarrhea, abd pain and leukocytosis in the setting of recent abx usage -continue vanco po x 8 more days after d/c -only 2 BM daily during hospitalization (son related 5-6 BMs daily at home)  CAUTI -pt has foley at home--placed 2 weeks ago due to skin breakdown -6/14 UA >50WBC -urine culture [facility] -received fosfomycin 6/15 and 6/17 during hospitalization  chronic systolic and diastolic CHF -7/34/1937 echo EF 40-45%, HK LV apex,severe LAE, moderate to severeMS,mild to moderateAS -09/07/18-repeat limited echo EF 55-60%, impaired relaxation -appears euvolemic -resume po  lasix -Daily weights -12/15/2017 office weight 235 pounds -8/3/20dischargeweight 238.7 lb -continue carvedilol  Chronic respiratory failure with hypoxia -She is chronically on 2-3 L at home -Stable presently -Personally reviewed chest x-ray--no edema or consolidation  diabetes mellitus type 2 with hyperglycemia -7/31/20Hemoglobin A1c 9.2 -07/20/19 A1C--6.7 -continue reduced dose lantus -novolog sliding scale  CKD stage III -Baseline creatinine 0.8-1.2 -Monitor clinically with lasix  Complete heart block -Status post PPM -personally reviewed EKG--paced rhythm  Hyponatremia -Secondary to poor po intake -Improving with IVF  Failure to Thrive -pt has had gradual functional and cognitive decline last 6 months per son -she is bedbound -she requires assistance with meals -her po intake is poor at home even with assistance -consult palliative medicine  Recurrent DVT -Continue warfarin  Depression -continue Cymbalta  Goals of Care -consult palliative medicine -DNR -pt continues to be brought into hospital ever 4-6 weeks     Discharge Instructions   Allergies as of 07/22/2019      Reactions   Codeine Anxiety   Compazine Anaphylaxis   Other Anaphylaxis, Rash, Other (See Comments)   Uncoded Allergy. Allergen: INOVAR Uncoded Allergy. Allergen: ALL ADHESIVES Uncoded Allergy. Allergen: SILK SUTURE Uncoded Allergy. Allergen: merthiolate Thermasol   Penicillins Shortness Of Breath, Rash   Has patient had a PCN reaction causing immediate rash, facial/tongue/throat swelling, SOB or lightheadedness with hypotension: Yes Has patient had a PCN reaction causing severe rash involving mucus membranes or skin necrosis: No Has patient had a PCN reaction that required hospitalization No Has patient had a PCN reaction occurring within the last 10 years: Yes If all of the above answers are "NO", then may proceed with Cephalosporin use.   Morphine And Related Other  (See Comments)   Patient states that it makes  her lose her state of mind.    Demerol [meperidine] Rash      Medication List    STOP taking these medications   clobetasol cream 0.05 % Commonly known as: TEMOVATE   diclofenac Sodium 1 % Gel Commonly known as: VOLTAREN   lidocaine 5 % Commonly known as: Lidoderm     TAKE these medications   carvedilol 3.125 MG tablet Commonly known as: COREG Take 1 tablet (3.125 mg total) by mouth 2 (two) times daily with a meal.   clomiPRAMINE 25 MG capsule Commonly known as: ANAFRANIL Take 1 capsule (25 mg total) by mouth 2 (two) times daily. What changed: when to take this   clonazePAM 1 MG tablet Commonly known as: KLONOPIN Take 1 tablet (1 mg total) by mouth at bedtime.   DULoxetine 60 MG capsule Commonly known as: CYMBALTA Take 100 mg by mouth daily.   ferrous sulfate 325 (65 FE) MG tablet Take 325 mg by mouth 2 (two) times daily with a meal.   furosemide 20 MG tablet Commonly known as: LASIX Take 1 tablet (20 mg total) by mouth 2 (two) times daily.   gabapentin 300 MG capsule Commonly known as: NEURONTIN Take 300 mg by mouth 2 (two) times daily.   insulin glargine 100 unit/mL Sopn Commonly known as: LANTUS Inject 0.2 mLs (20 Units total) into the skin daily.   magnesium oxide 400 MG tablet Commonly known as: MAG-OX Take 400 mg by mouth daily.   metFORMIN 500 MG tablet Commonly known as: GLUCOPHAGE Take 500 mg by mouth 2 (two) times daily with a meal.   morphine CONCENTRATE 10 mg / 0.5 ml concentrated solution Take 10 mg by mouth.   oxyCODONE 15 MG immediate release tablet Commonly known as: ROXICODONE Take 15 mg by mouth 3 (three) times daily.   potassium chloride 10 MEQ tablet Commonly known as: KLOR-CON Take 1 tablet (10 mEq total) by mouth daily.   QUEtiapine 100 MG tablet Commonly known as: SEROQUEL Take 100 mg by mouth at bedtime.   vancomycin 125 MG capsule Commonly known as: VANCOCIN Take 1  capsule (125 mg total) by mouth 4 (four) times daily. Through 07/29/2019   warfarin 2.5 MG tablet Commonly known as: COUMADIN Take 2.5 mg by mouth daily.       Allergies  Allergen Reactions  . Codeine Anxiety  . Compazine Anaphylaxis  . Other Anaphylaxis, Rash and Other (See Comments)    Uncoded Allergy. Allergen: INOVAR Uncoded Allergy. Allergen: ALL ADHESIVES Uncoded Allergy. Allergen: SILK SUTURE Uncoded Allergy. Allergen: merthiolate Thermasol  . Penicillins Shortness Of Breath and Rash    Has patient had a PCN reaction causing immediate rash, facial/tongue/throat swelling, SOB or lightheadedness with hypotension: Yes Has patient had a PCN reaction causing severe rash involving mucus membranes or skin necrosis: No Has patient had a PCN reaction that required hospitalization No Has patient had a PCN reaction occurring within the last 10 years: Yes If all of the above answers are "NO", then may proceed with Cephalosporin use.   Marland Kitchen Morphine And Related Other (See Comments)    Patient states that it makes her lose her state of mind.   . Demerol [Meperidine] Rash    Consultations:  palliative   Procedures/Studies: CT Head Wo Contrast  Result Date: 07/19/2019 CLINICAL DATA:  Encephalopathy.  Headache EXAM: CT HEAD WITHOUT CONTRAST TECHNIQUE: Contiguous axial images were obtained from the base of the skull through the vertex without intravenous contrast. COMPARISON:  CT head 03/24/2019 FINDINGS:  Brain: Moderate atrophy with enlargement of the ventricles and subarachnoid space, similar to the prior study. Moderate hypodensity throughout the cerebral white matter unchanged from the prior study. Negative for acute infarct, hemorrhage, mass. Vascular: Negative for hyperdense vessel. Atherosclerotic calcification right middle cerebral artery and in the cavernous carotid bilaterally. Skull: Negative Sinuses/Orbits: Paranasal sinuses clear.  No orbital mass. Other: None IMPRESSION:  Moderate atrophy and moderate chronic microvascular ischemic change. No acute abnormality and no change from the prior CT. Electronically Signed   By: Marlan Palau M.D.   On: 07/19/2019 18:39   DG Chest Port 1 View  Result Date: 07/19/2019 CLINICAL DATA:  Confusion. EXAM: PORTABLE CHEST 1 VIEW COMPARISON:  Chest x-ray and chest CT dated 03/24/2019 FINDINGS: Heart size is within normal limits considering the portable technique. Pulmonary vascularity is normal. Pacemaker in place. Slight chronic accentuation of the interstitial markings. No acute infiltrates or effusions. No acute bone abnormality. IMPRESSION: No acute abnormalities. Electronically Signed   By: Francene Boyers M.D.   On: 07/19/2019 17:44        Discharge Exam: Vitals:   07/21/19 2247 07/22/19 0511  BP: (!) 146/79 (!) 95/58  Pulse: (!) 59 (!) 58  Resp: 20 20  Temp: 98.8 F (37.1 C) (!) 97.3 F (36.3 C)  SpO2: 100% 96%   Vitals:   07/21/19 1646 07/21/19 2012 07/21/19 2247 07/22/19 0511  BP: (!) 114/57  (!) 146/79 (!) 95/58  Pulse: 60  (!) 59 (!) 58  Resp:   20 20  Temp:   98.8 F (37.1 C) (!) 97.3 F (36.3 C)  TempSrc:   Oral Axillary  SpO2:  100% 100% 96%  Weight:      Height:        General: Pt is alert, awake, not in acute distress Cardiovascular: RRR, S1/S2 +, no rubs, no gallops Respiratory: bibasilar crackles, no wheeze Abdominal: Soft, NT, ND, bowel sounds + Extremities: no edema, no cyanosis   The results of significant diagnostics from this hospitalization (including imaging, microbiology, ancillary and laboratory) are listed below for reference.    Significant Diagnostic Studies: CT Head Wo Contrast  Result Date: 07/19/2019 CLINICAL DATA:  Encephalopathy.  Headache EXAM: CT HEAD WITHOUT CONTRAST TECHNIQUE: Contiguous axial images were obtained from the base of the skull through the vertex without intravenous contrast. COMPARISON:  CT head 03/24/2019 FINDINGS: Brain: Moderate atrophy with  enlargement of the ventricles and subarachnoid space, similar to the prior study. Moderate hypodensity throughout the cerebral white matter unchanged from the prior study. Negative for acute infarct, hemorrhage, mass. Vascular: Negative for hyperdense vessel. Atherosclerotic calcification right middle cerebral artery and in the cavernous carotid bilaterally. Skull: Negative Sinuses/Orbits: Paranasal sinuses clear.  No orbital mass. Other: None IMPRESSION: Moderate atrophy and moderate chronic microvascular ischemic change. No acute abnormality and no change from the prior CT. Electronically Signed   By: Marlan Palau M.D.   On: 07/19/2019 18:39   DG Chest Port 1 View  Result Date: 07/19/2019 CLINICAL DATA:  Confusion. EXAM: PORTABLE CHEST 1 VIEW COMPARISON:  Chest x-ray and chest CT dated 03/24/2019 FINDINGS: Heart size is within normal limits considering the portable technique. Pulmonary vascularity is normal. Pacemaker in place. Slight chronic accentuation of the interstitial markings. No acute infiltrates or effusions. No acute bone abnormality. IMPRESSION: No acute abnormalities. Electronically Signed   By: Francene Boyers M.D.   On: 07/19/2019 17:44     Microbiology: Recent Results (from the past 240 hour(s))  Urine Culture  Status: Abnormal   Collection Time: 07/19/19  3:48 PM   Specimen: Urine, Random  Result Value Ref Range Status   Specimen Description   Final    URINE, RANDOM Performed at [facility], [address], [place], [place] [address]    Special Requests   Final    NONE Performed at [facility], [address] [place], [place] [address]    Culture >=100,000 COLONIES/mL ESCHERICHIA COLI (A)  Final   Report Status 07/22/2019 FINAL  Final   Organism ID, Bacteria ESCHERICHIA COLI (A)  Final      Susceptibility   Escherichia coli - MIC*    AMPICILLIN 8 SENSITIVE Sensitive     CEFAZOLIN <=4 SENSITIVE Sensitive     CEFTRIAXONE <=0.25 SENSITIVE Sensitive     CIPROFLOXACIN  1 SENSITIVE Sensitive     GENTAMICIN <=1 SENSITIVE Sensitive     IMIPENEM <=0.25 SENSITIVE Sensitive     NITROFURANTOIN <=16 SENSITIVE Sensitive     TRIMETH/SULFA <=20 SENSITIVE Sensitive     AMPICILLIN/SULBACTAM 4 SENSITIVE Sensitive     PIP/TAZO <=4 SENSITIVE Sensitive     * >=100,000 COLONIES/mL ESCHERICHIA COLI  C Difficile Quick Screen w PCR reflex     Status: Abnormal   Collection Time: 07/19/19  4:41 PM   Specimen: STOOL  Result Value Ref Range Status   C Diff antigen POSITIVE (A) NEGATIVE Final   C Diff toxin NEGATIVE NEGATIVE Final   C Diff interpretation Results are indeterminate. See PCR results.  Final    Comment: Performed at [facility], [address] [place], [place] [address]  C. Diff by PCR, Reflexed     Status: Abnormal   Collection Time: 07/19/19  4:41 PM  Result Value Ref Range Status   Toxigenic C. Difficile by PCR POSITIVE (A) NEGATIVE Final    Comment: Positive for toxigenic C. difficile with little to no toxin production. Only treat if clinical presentation suggests symptomatic illness. Performed at [facility], [address] [place], [place] [address]   SARS Coronavirus 2 by RT PCR (hospital order, performed in [facility] hospital lab) Nasopharyngeal Nasopharyngeal Swab     Status: None   Collection Time: 07/19/19  7:26 PM   Specimen: Nasopharyngeal Swab  Result Value Ref Range Status   SARS Coronavirus 2 NEGATIVE NEGATIVE Final    Comment: (NOTE) SARS-CoV-2 target nucleic acids are NOT DETECTED.  The SARS-CoV-2 RNA is generally detectable in upper and lower respiratory specimens during the acute phase of infection. The lowest concentration of SARS-CoV-2 viral copies this assay can detect is 250 copies / mL. A negative result does not preclude SARS-CoV-2 infection and should not be used as the sole basis for treatment or other patient management decisions.  A negative result may occur with improper specimen collection / handling, submission of  specimen other than nasopharyngeal swab, presence of viral mutation(s) within the areas targeted by this assay, and inadequate number of viral copies (<250 copies / mL). A negative result must be combined with clinical observations, patient history, and epidemiological information.  Fact Sheet for Patients:   BoilerBrush.com.cy  Fact Sheet for Healthcare Providers: https://pope.com/  This test is not yet approved or  cleared by the [place] FDA and has been authorized for detection and/or diagnosis of SARS-CoV-2 by FDA under an Emergency Use Authorization (EUA).  This EUA will remain in effect (meaning this test can be used) for the duration of the COVID-19 declaration under Section 564(b)(1) of the Act, 21  U.S.C. section 360bbb-3(b)(1), unless the authorization is terminated or revoked sooner.  Performed at [facility], [address] [place], [place] [address]      Labs: Basic Metabolic Panel: Recent Labs  Lab 07/19/19 1532 07/19/19 1532 07/20/19 0530 07/20/19 0530 07/21/19 0634 07/22/19 0655  NA 132*  --  134*  --  134* 132*  K 4.2   < > 4.1   < > 4.4 4.3  CL 92*  --  94*  --  93* 92*  CO2 29  --  28  --  27 28  GLUCOSE 201*  --  166*  --  123* 137*  BUN 28*  --  30*  --  28* 24*  CREATININE 0.81  --  0.86  --  0.85 0.70  CALCIUM 9.1  --  9.3  --  9.5 9.3  MG 2.0  --   --   --   --   --    < > = values in this interval not displayed.   Liver Function Tests: Recent Labs  Lab 07/19/19 1532 07/20/19 0530  AST 20 19  ALT 18 18  ALKPHOS 55 55  BILITOT 0.8 1.0  PROT 6.9 6.8  ALBUMIN 2.7* 2.8*   Recent Labs  Lab 07/19/19 1532  LIPASE 15   No results for input(s): AMMONIA in the last 168 hours. CBC: Recent Labs  Lab 07/19/19 1532 07/20/19 0530 07/21/19 0634 07/22/19 0655  WBC 14.0* 11.5* 11.2* 8.6  NEUTROABS 11.1* 8.6* 8.3* 5.9  HGB 12.3 11.8* 11.9* 11.7*  HCT 38.3 37.1 36.8 37.0  MCV 91.8 92.8  92.2 91.6  PLT 351 325 343 360   Cardiac Enzymes: No results for input(s): CKTOTAL, CKMB, CKMBINDEX, TROPONINI in the last 168 hours. BNP: Invalid input(s): POCBNP CBG: Recent Labs  Lab 07/20/19 1200 07/20/19 1713 07/20/19 2051 07/21/19 1750 07/22/19 0807  GLUCAP 121* 147* 122* 231* 129*    Time coordinating discharge:  36 minutes  Signed:  Catarina Hartshorn, DO [facility] Pager: [phone] 07/22/2019, 10:56 AM

## 2019-07-22 NOTE — Progress Notes (Signed)
ANTICOAGULATION CONSULT NOTE -   Pharmacy Consult for Warfarin Indication: atrial fibrillation and history of recurrent DVTs  Allergies  Allergen Reactions  . Codeine Anxiety  . Compazine Anaphylaxis  . Other Anaphylaxis, Rash and Other (See Comments)    Uncoded Allergy. Allergen: INOVAR Uncoded Allergy. Allergen: ALL ADHESIVES Uncoded Allergy. Allergen: SILK SUTURE Uncoded Allergy. Allergen: merthiolate Thermasol  . Penicillins Shortness Of Breath and Rash    Has patient had a PCN reaction causing immediate rash, facial/tongue/throat swelling, SOB or lightheadedness with hypotension: Yes Has patient had a PCN reaction causing severe rash involving mucus membranes or skin necrosis: No Has patient had a PCN reaction that required hospitalization No Has patient had a PCN reaction occurring within the last 10 years: Yes If all of the above answers are "NO", then may proceed with Cephalosporin use.   Marland Kitchen Morphine And Related Other (See Comments)    Patient states that it makes her lose her state of mind.   . Demerol [Meperidine] Rash    Patient Measurements: Height: 5\' 6"  (167.6 cm) Weight: 90 kg (198 lb 6.6 oz) IBW/kg (Calculated) : 59.3  Vital Signs: Temp: 97.3 F (36.3 C) (06/17 0511) Temp Source: Axillary (06/17 0511) BP: 95/58 (06/17 0511) Pulse Rate: 58 (06/17 0511)  Labs: Recent Labs    07/20/19 0530 07/20/19 0530 07/21/19 0634 07/22/19 0655  HGB 11.8*   < > 11.9* 11.7*  HCT 37.1  --  36.8 37.0  PLT 325  --  343 360  LABPROT 16.3*  --  17.0* 18.4*  INR 1.4*  --  1.4* 1.6*  CREATININE 0.86  --  0.85 0.70   < > = values in this interval not displayed.    Estimated Creatinine Clearance: 64.5 mL/min (by C-G formula based on SCr of 0.7 mg/dL).   Medical History: Past Medical History:  Diagnosis Date  . Aortic stenosis   . Cataplexy   . Diastolic heart failure ([place])   . Essential hypertension   . GERD (gastroesophageal reflux disease)   . Hiatal hernia    . History of recurrent deep vein thrombosis (DVT)   . Mitral stenosis   . Narcolepsy   . Neuropathy   . Renal disorder   . Type 2 diabetes mellitus (HCC)     Medications:  Medications Prior to Admission  Medication Sig Dispense Refill Last Dose  . carvedilol (COREG) 3.125 MG tablet Take 1 tablet (3.125 mg total) by mouth 2 (two) times daily with a meal. 180 tablet 3 07/19/2019 at 2100  . clomiPRAMINE (ANAFRANIL) 25 MG capsule Take 1 capsule (25 mg total) by mouth 2 (two) times daily. (Patient taking differently: Take 25 mg by mouth in the morning, at noon, in the evening, and at bedtime. ) 60 capsule 2 07/19/2019 at Unknown time  . clonazePAM (KLONOPIN) 1 MG tablet Take 1 tablet (1 mg total) by mouth at bedtime.   07/19/2019 at Unknown time  . DULoxetine (CYMBALTA) 60 MG capsule Take 100 mg by mouth daily.    07/19/2019 at Unknown time  . ferrous sulfate 325 (65 FE) MG tablet Take 325 mg by mouth 2 (two) times daily with a meal.    07/19/2019 at Unknown time  . furosemide (LASIX) 20 MG tablet Take 1 tablet (20 mg total) by mouth 2 (two) times daily. 180 tablet 3 07/19/2019 at Unknown time  . gabapentin (NEURONTIN) 300 MG capsule Take 300 mg by mouth 2 (two) times daily.   07/19/2019 at Unknown time  .  insulin glargine (LANTUS) 100 unit/mL SOPN Inject 0.2 mLs (20 Units total) into the skin daily. 15 mL 11 07/19/2019 at Unknown time  . magnesium oxide (MAG-OX) 400 MG tablet Take 400 mg by mouth daily.   07/19/2019 at Unknown time  . metFORMIN (GLUCOPHAGE) 500 MG tablet Take 500 mg by mouth 2 (two) times daily with a meal.   07/19/2019 at Unknown time  . Morphine Sulfate (MORPHINE CONCENTRATE) 10 mg / 0.5 ml concentrated solution Take 10 mg by mouth.    07/19/2019 at Unknown time  . oxyCODONE (ROXICODONE) 15 MG immediate release tablet Take 15 mg by mouth 3 (three) times daily.   07/19/2019 at Unknown time  . potassium chloride (KLOR-CON) 10 MEQ tablet Take 1 tablet (10 mEq total) by mouth daily. 90 tablet  3 07/19/2019 at Unknown time  . QUEtiapine (SEROQUEL) 100 MG tablet Take 100 mg by mouth at bedtime.   07/19/2019 at Unknown time  . warfarin (COUMADIN) 2.5 MG tablet Take 2.5 mg by mouth daily.    07/19/2019 at 0900  . clobetasol cream (TEMOVATE) 0.05 % Apply 1 application topically daily.  (Patient not taking: Reported on 07/20/2019)   Not Taking at Unknown time  . diclofenac Sodium (VOLTAREN) 1 % GEL Apply 1 application topically daily as needed. (Patient not taking: Reported on 07/20/2019)   Not Taking at Unknown time  . lidocaine (LIDODERM) 5 % Place 1 patch onto the skin daily. Remove & Discard patch within 12 hours or as directed by MD (Patient not taking: Reported on 07/20/2019) 30 patch 0 Not Taking at Unknown time    Assessment: 79 year old female with a history of atrial fibrillation and recurrent DVTs on Warfarin was admitted on 07/19/19 with diarrhea for 5 days and concern for possible UTI. Patient's CBC is within normal limits. No bleeding reported.  Admission INR is sub-therapeutic at 1.2 (goal 2-3). Slowly increasing  INR 1.6 today  Home regimen is 2.5mg  po daily.  Goal of Therapy:  INR 2-3 Monitor platelets by anticoagulation protocol: Yes   Plan:  Coumadin 5 mg x 1 today  Daily PT/INR Monitor for drug interactions or signs and symptoms of bleeding.   Elder Cyphers, BS Pharm D, BCPS Clinical Pharmacist Pager [phone] 07/22/2019 8:25 AM

## 2019-07-22 NOTE — Progress Notes (Signed)
Nsg Discharge Note  Admit Date:  07/19/2019 Discharge date: 07/22/2019   Margie Billet Cong to be D/C'd Home per MD order.  AVS completed.  Copy for chart, and copy for patient signed, and dated. Removed IV-clean, dry, intact. Called granddaughter to let her know patient was leaving the hospital via [facility]. Patient let with [facility] to d/c to home with home hospice. Patient/caregiver able to verbalize understanding.  Discharge Medication: Allergies as of 07/22/2019      Reactions   Codeine Anxiety   Compazine Anaphylaxis   Other Anaphylaxis, Rash, Other (See Comments)   Uncoded Allergy. Allergen: INOVAR Uncoded Allergy. Allergen: ALL ADHESIVES Uncoded Allergy. Allergen: SILK SUTURE Uncoded Allergy. Allergen: merthiolate Thermasol   Penicillins Shortness Of Breath, Rash   Has patient had a PCN reaction causing immediate rash, facial/tongue/throat swelling, SOB or lightheadedness with hypotension: Yes Has patient had a PCN reaction causing severe rash involving mucus membranes or skin necrosis: No Has patient had a PCN reaction that required hospitalization No Has patient had a PCN reaction occurring within the last 10 years: Yes If all of the above answers are "NO", then may proceed with Cephalosporin use.   Morphine And Related Other (See Comments)   Patient states that it makes her lose her state of mind.    Demerol [meperidine] Rash      Medication List    STOP taking these medications   clobetasol cream 0.05 % Commonly known as: TEMOVATE   diclofenac Sodium 1 % Gel Commonly known as: VOLTAREN   lidocaine 5 % Commonly known as: Lidoderm     TAKE these medications   carvedilol 3.125 MG tablet Commonly known as: COREG Take 1 tablet (3.125 mg total) by mouth 2 (two) times daily with a meal.   clomiPRAMINE 25 MG capsule Commonly known as: ANAFRANIL Take 1 capsule (25 mg total) by mouth 2 (two) times daily. What changed: when to take this   clonazePAM 1 MG tablet Commonly known  as: KLONOPIN Take 1 tablet (1 mg total) by mouth at bedtime.   DULoxetine 60 MG capsule Commonly known as: CYMBALTA Take 100 mg by mouth daily.   ferrous sulfate 325 (65 FE) MG tablet Take 325 mg by mouth 2 (two) times daily with a meal.   furosemide 20 MG tablet Commonly known as: LASIX Take 1 tablet (20 mg total) by mouth 2 (two) times daily.   gabapentin 300 MG capsule Commonly known as: NEURONTIN Take 300 mg by mouth 2 (two) times daily.   insulin glargine 100 unit/mL Sopn Commonly known as: LANTUS Inject 0.2 mLs (20 Units total) into the skin daily.   magnesium oxide 400 MG tablet Commonly known as: MAG-OX Take 400 mg by mouth daily.   metFORMIN 500 MG tablet Commonly known as: GLUCOPHAGE Take 500 mg by mouth 2 (two) times daily with a meal.   morphine CONCENTRATE 10 mg / 0.5 ml concentrated solution Take 10 mg by mouth.   oxyCODONE 15 MG immediate release tablet Commonly known as: ROXICODONE Take 15 mg by mouth 3 (three) times daily.   potassium chloride 10 MEQ tablet Commonly known as: KLOR-CON Take 1 tablet (10 mEq total) by mouth daily.   QUEtiapine 100 MG tablet Commonly known as: SEROQUEL Take 100 mg by mouth at bedtime.   vancomycin 125 MG capsule Commonly known as: VANCOCIN Take 1 capsule (125 mg total) by mouth 4 (four) times daily. Through 07/29/2019   warfarin 2.5 MG tablet Commonly known as: COUMADIN Take 2.5 mg  by mouth daily.       Discharge Assessment: Vitals:   07/22/19 0511 07/22/19 1459  BP: (!) 95/58 (!) 121/58  Pulse: (!) 58 60  Resp: 20 18  Temp: (!) 97.3 F (36.3 C)   SpO2: 96% 100%   Skin clean, dry and intact without evidence of skin break down, no evidence of skin tears noted. IV catheter discontinued intact. Site without signs and symptoms of complications - no redness or edema noted at insertion site, patient denies c/o pain - only slight tenderness at site.  Dressing with slight pressure applied.  D/c  Instructions-Education: Discharge instructions given to patient/family with verbalized understanding. D/c education completed with patient/family including follow up instructions, medication list, d/c activities limitations if indicated, with other d/c instructions as indicated by MD - patient able to verbalize understanding, all questions fully answered. Patient instructed to return to ED, call 911, or call MD for any changes in condition.  Patient escorted via WC, and D/C home via private auto.  Karolee Ohs, RN 07/22/2019 7:40 PM

## 2019-09-05 DEATH — deceased

## 2019-10-06 DEATH — deceased

## 2020-01-06 ENCOUNTER — Telehealth: Payer: Medicare Other | Admitting: Cardiovascular Disease

## 2020-01-06 ENCOUNTER — Telehealth: Payer: Medicare Other | Admitting: Cardiology

## 2020-07-27 IMAGING — CT CT CHEST W/ CM
3 of 6 series · 12 of 36 positions shown, 14 images · IV contrast (Omnipaque or Isovue)
Comparison: 09/04/2018 chest CT. 03/02/2018 CT abdomen/pelvis.
Chest radiograph from earlier today.

CLINICAL DATA: Fall this morning with right facial injury.

EXAM:
CT CHEST, ABDOMEN, AND PELVIS WITH CONTRAST
TECHNIQUE: Multidetector CT imaging of the chest, abdomen and pelvis was
performed following the standard protocol during bolus
administration of intravenous contrast.
CONTRAST:  100mL OMNIPAQUE IOHEXOL 300 MG/ML  SOLN

[Series 2: cap with · axial · 0.71mm/px · z∈[+922,+1397]mm · 7 of 127 slices shown, 9 images]
[im 16/127  mediastinal]
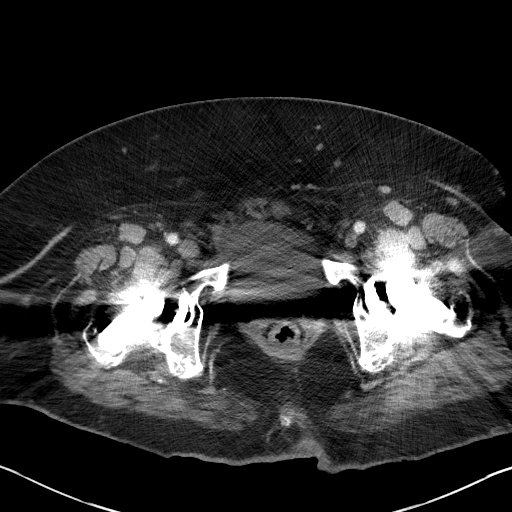
[im 16/127  lung]
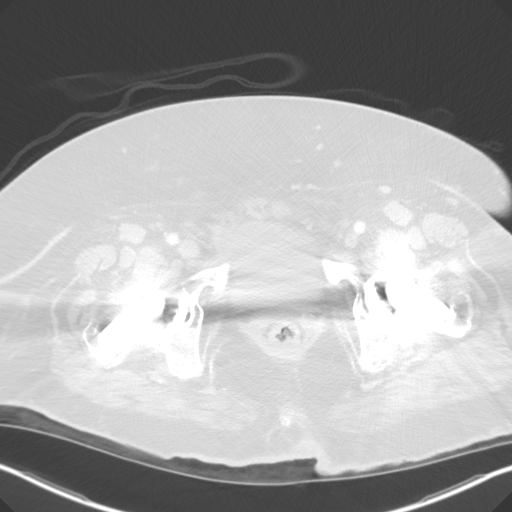
[im 32/127  lung]
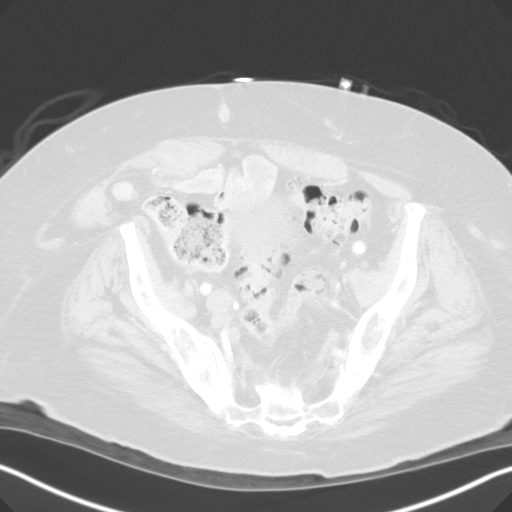
[im 48/127  lung]
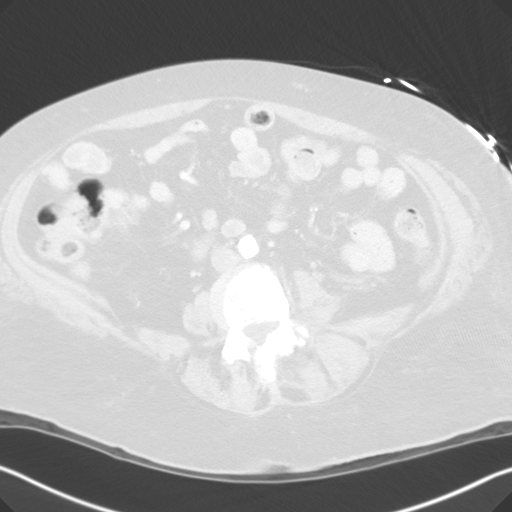
[im 64/127  lung]
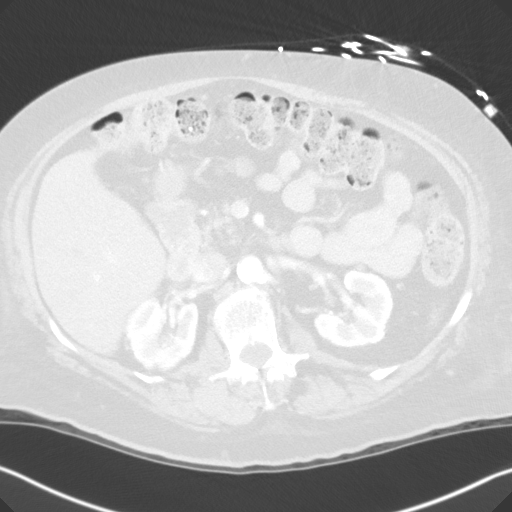
[im 79/127  mediastinal]
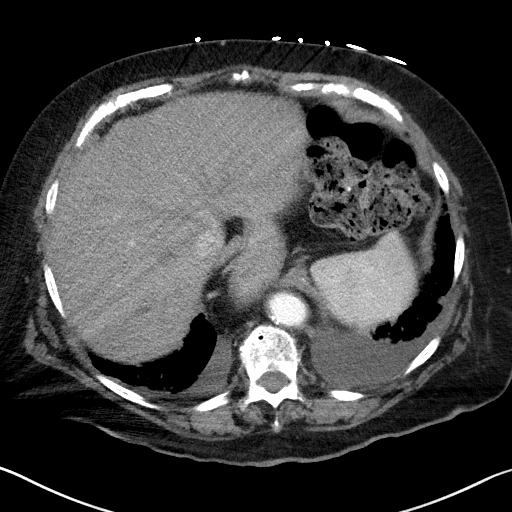
[im 79/127  lung]
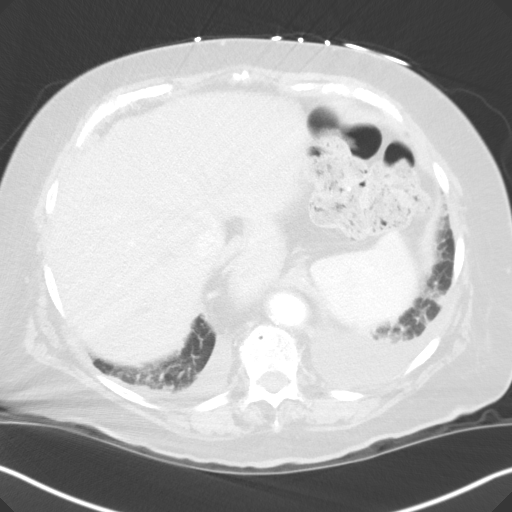
[im 95/127  lung]
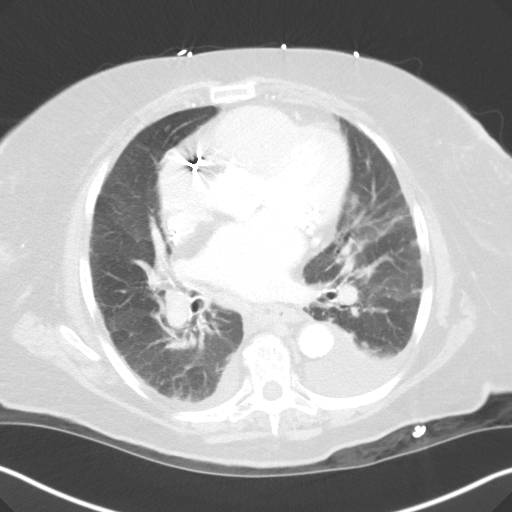
[im 111/127  lung]
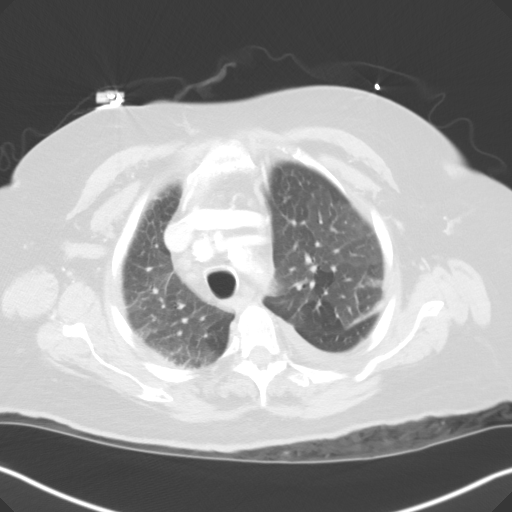

[Series 3: lung · axial · 0.71mm/px · z∈[+1245,+1279]mm · 2 of 133 slices shown]
[im 17/133  lung]
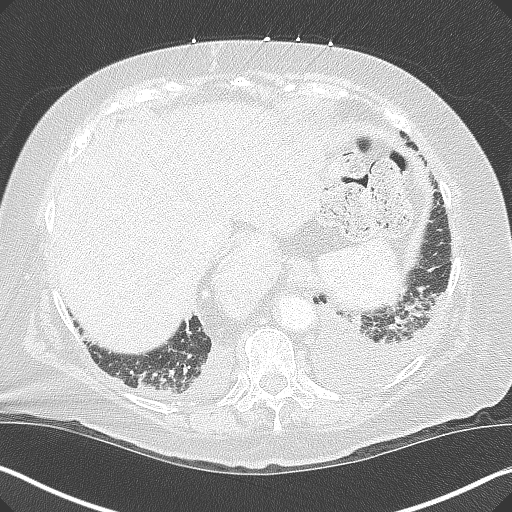
[im 34/133  lung]
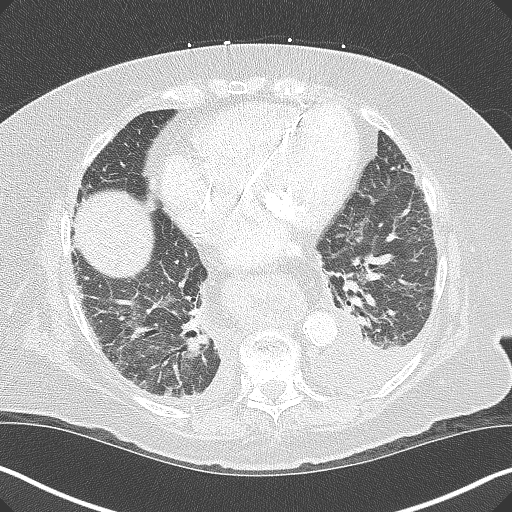

[Series 4: coronals · coronal · 0.80mm/px · 3 of 159 slices shown]
[im 32/159  lung]
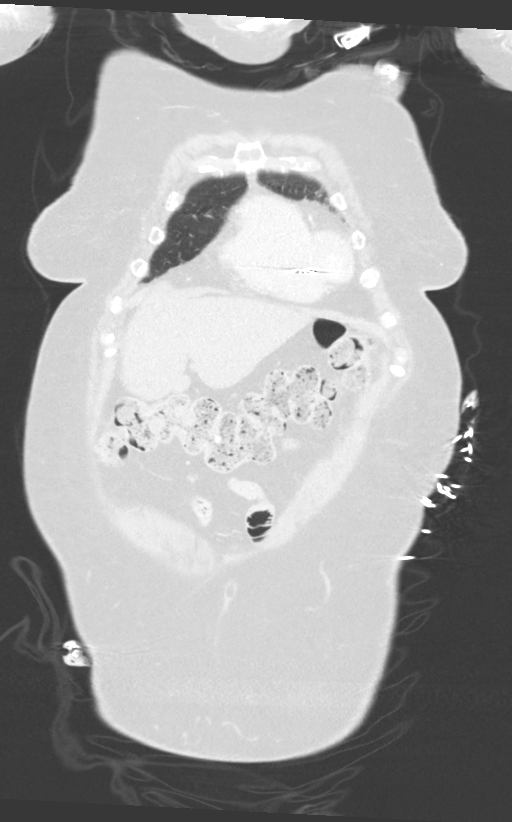
[im 64/159  lung]
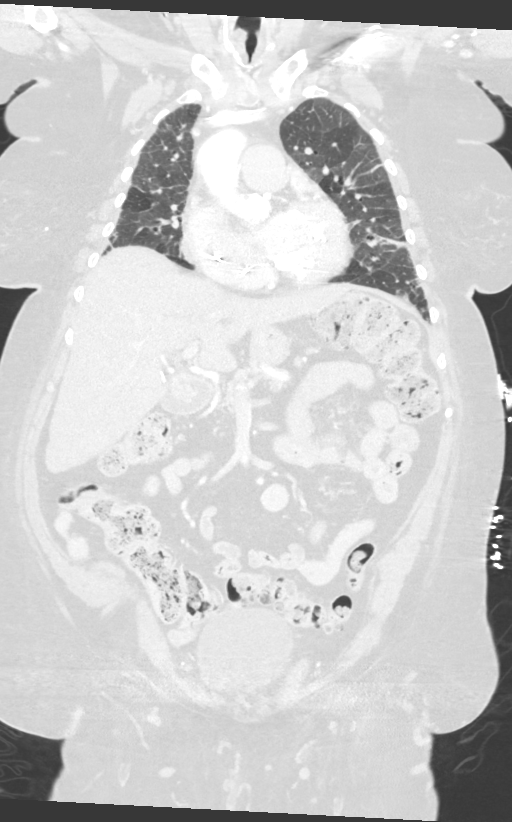
[im 95/159  lung]
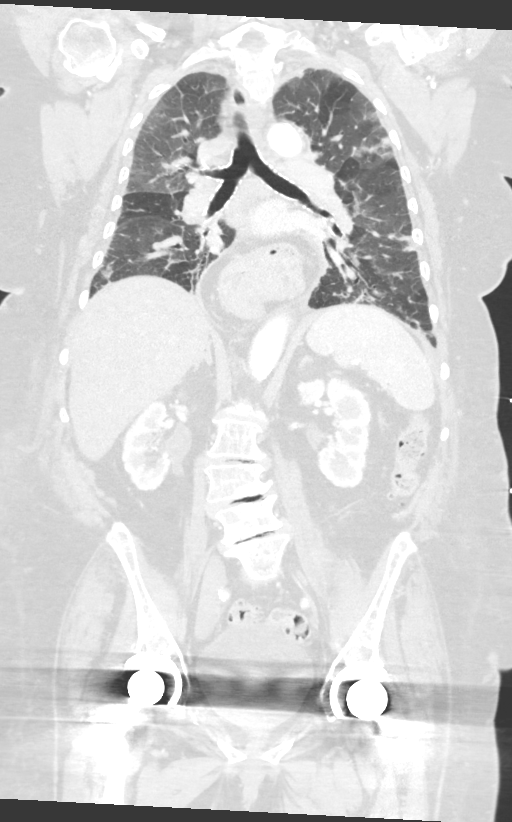

[12 of 36 positions shown; findings below may reference images not displayed]

FINDINGS: CT CHEST FINDINGS

Cardiovascular: Mild cardiomegaly. No significant pericardial
fluid/thickening. Three-vessel coronary atherosclerosis. Stable
configuration of 2 lead left subclavian pacemaker with lead tips in
the right atrium and right ventricular apex. Atherosclerotic
nonaneurysmal thoracic aorta. Top-normal caliber main pulmonary
artery (3.3 cm diameter). No evidence of acute thoracic aortic
injury. No central pulmonary emboli.

Mediastinum/Nodes: No pneumomediastinum. No mediastinal hematoma.
Enlarged multinodular goiter asymmetrically involving the left
thyroid lobe with dominant heterogeneous hypodense 4.7 cm left
thyroid nodule, not definitely changed since 09/04/2018 CT. Mild
right tracheal deviation by the goiter. Unremarkable esophagus. No
axillary adenopathy. Right paratracheal adenopathy up to 1.6 cm,
increased from 1.2 cm on 09/04/2018 CT. Enlarged 1.7 cm subcarinal
node (series 2/image 28), increased from 1.4 cm. No hilar
adenopathy.

Lungs/Pleura: No pneumothorax. Small dependent bilateral pleural
effusions, left greater than right. Interlobular septal thickening
and patchy subpleural reticulation and ground-glass opacity
throughout both lungs, similar. Mosaic attenuation throughout both
lungs, similar to mildly worsened. Solid 6 mm peripheral right lower
lobe pulmonary nodule (series 3/image 105), stable. No acute
consolidative airspace disease, lung masses or new significant
pulmonary nodules. Mild passive atelectasis in the dependent lower
lobes bilaterally.

Musculoskeletal: No aggressive appearing focal osseous lesions. No
acute fracture detected in the chest. Chronic mild superior T12
vertebral compression fractures unchanged. Mild thoracic
spondylosis.

CT ABDOMEN PELVIS FINDINGS

Hepatobiliary: Normal liver size. No definite liver surface
irregularity. Hypervascular 2.0 cm inferior right liver focus
(series 2/image 59), faintly visualized and stable since 03/02/2018
CT abdomen study. No new liver lesions. No liver laceration.
Cholecystectomy. Bile ducts are stable and within normal post
cholecystectomy limits.

Pancreas: Normal, with no laceration, mass or duct dilation.

Spleen: Normal size. No laceration or mass.

Adrenals/Urinary Tract: Normal adrenals. No hydronephrosis. No renal
laceration. Subcentimeter hypodense renal cortical lesion in the
posterior lower left kidney is too small to characterize and is
unchanged, considered benign. No new renal lesions. Punctate
nonobstructing lower right renal stone. Bladder obscured by streak
artifact from bilateral hip hardware with no gross bladder
abnormality.

Stomach/Bowel: Large hiatal hernia. Otherwise normal nondistended
stomach. Normal caliber small bowel with no small bowel wall
thickening. Normal appendix. Marked sigmoid diverticulosis, with no
large bowel wall thickening or acute pericolonic fat stranding.

Vascular/Lymphatic: Atherosclerotic nonaneurysmal abdominal aorta.
Patent portal, splenic, hepatic and renal veins. No pathologically
enlarged lymph nodes in the abdomen or pelvis.

Reproductive: Status post hysterectomy, with no abnormal findings at
the vaginal cuff. No adnexal mass.

Other: No pneumoperitoneum, ascites or focal fluid collection.

Musculoskeletal: No aggressive appearing focal osseous lesions. No
fracture in the abdomen or pelvis. Bilateral total hip arthroplasty.
Chronic bilateral L5 pars defects. Severe degenerative disc disease
throughout the lumbar spine.
IMPRESSION: 1. No evidence of acute traumatic injury in the chest, abdomen or
pelvis.
2. Spectrum of findings suggestive of congestive heart failure,
including cardiomegaly, small dependent bilateral pleural effusions,
and interlobular septal thickening throughout both lungs.
3. Nonspecific chronic mosaic attenuation throughout both lungs,
which may be due to mosaic perfusion from pulmonary vascular disease
or air trapping from small airways disease.
4. Chronic nonspecific subpleural reticulation and ground-glass
opacity throughout both lungs, similar, cannot exclude superimposed
interstitial lung disease. Dedicated high-resolution chest CT
follow-up in 6 months may be considered.
5. Nonspecific mild mediastinal lymphadenopathy, increased. Suggest
attention on follow-up chest CT with IV contrast in 3 months.
6. Large hiatal hernia.
7. Punctate nonobstructing lower right renal stone.
8. Marked sigmoid diverticulosis.
9. Three-vessel coronary atherosclerosis.
10. Enlarged multinodular goiter asymmetrically involving the left
thyroid lobe with dominant 4.7 cm left thyroid nodule, not
definitely changed since 09/04/2018 CT. Recommend thyroid US (ref: [HOSPITAL]. [DATE]): 143-50).

## 2020-07-27 IMAGING — DX DG FEMUR 2+V*L*
4 series · 4 of 4 positions shown · non-contrast
Comparison: None.

CLINICAL DATA: Left leg pain after fall

EXAM:
LEFT FEMUR 2 VIEWS; LEFT KNEE - COMPLETE 4+ VIEW; LEFT TIBIA AND
FIBULA - 2 VIEW

[femur ap (1 of 2)]
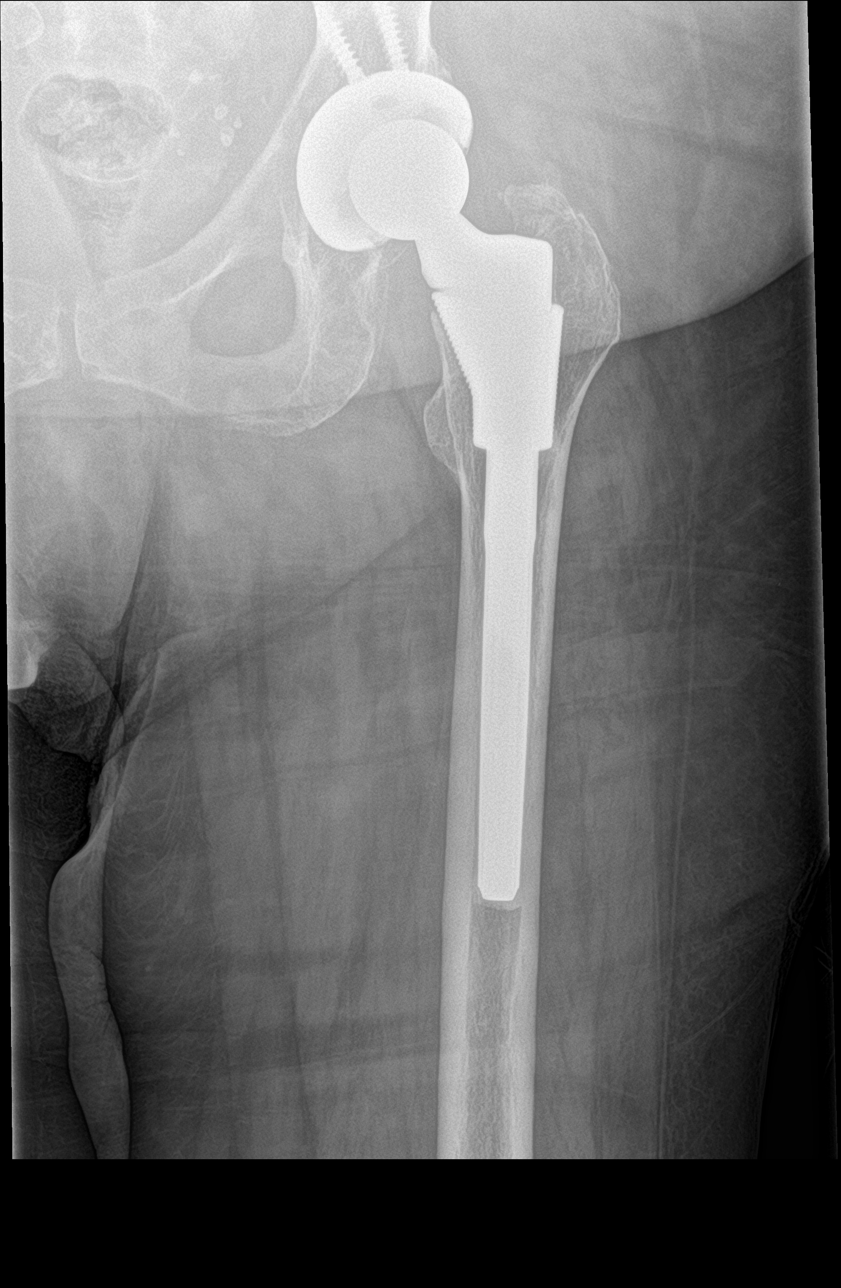

[femur ap (2 of 2)]
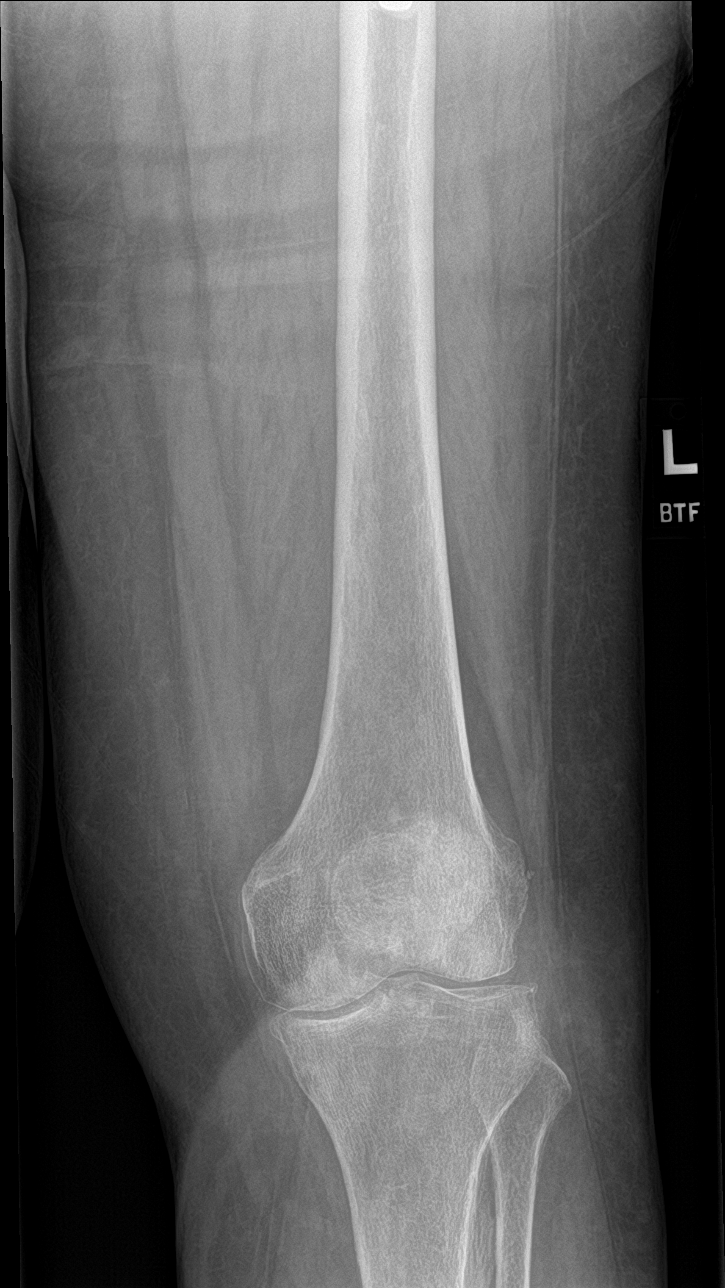

[femur lat (1 of 2)]
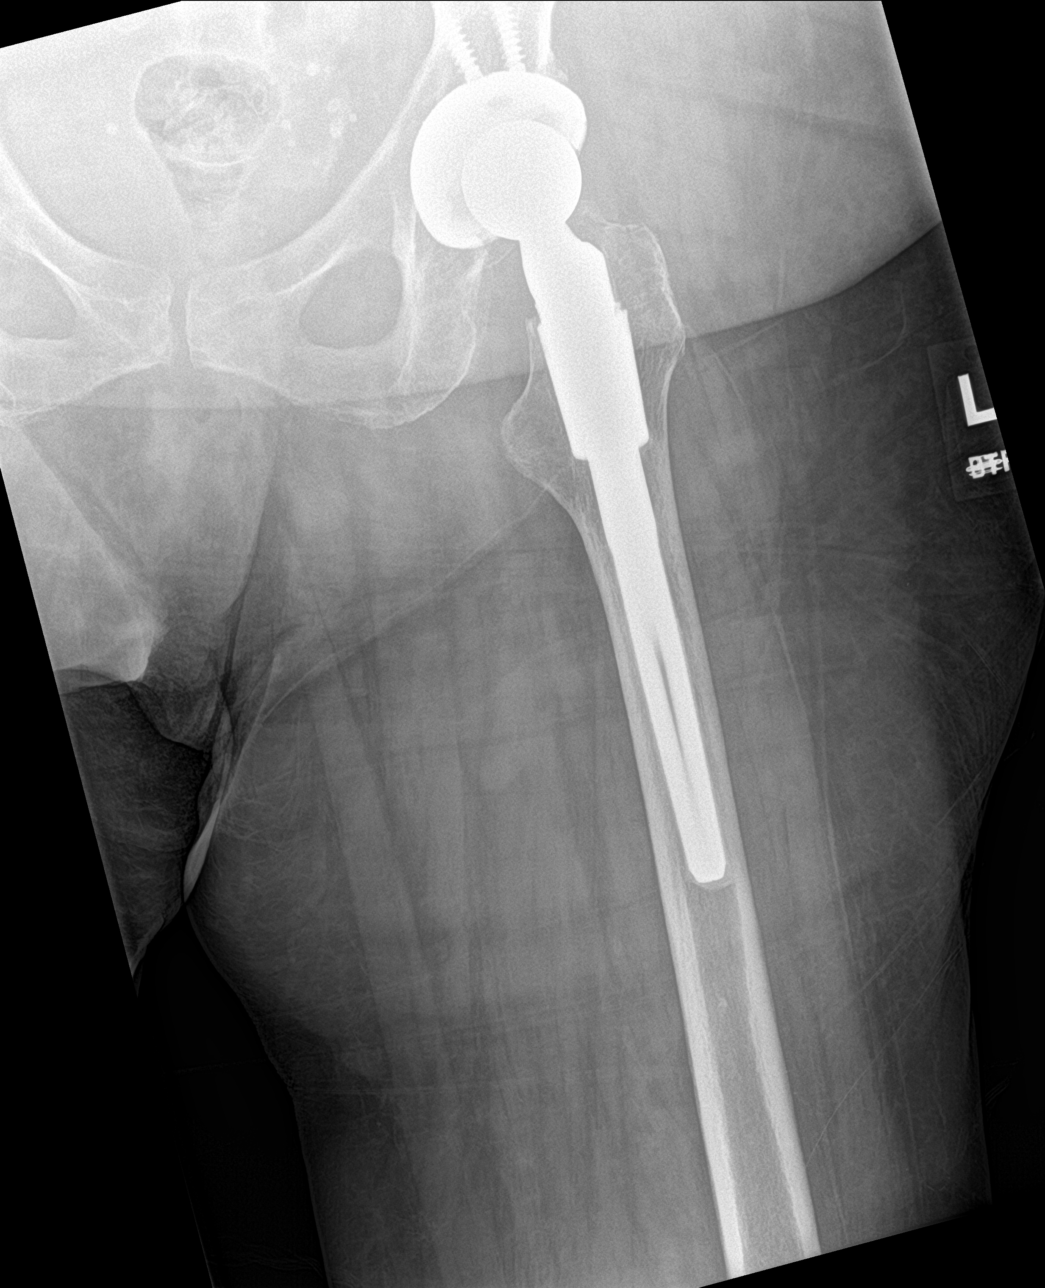

[femur lat (2 of 2)]
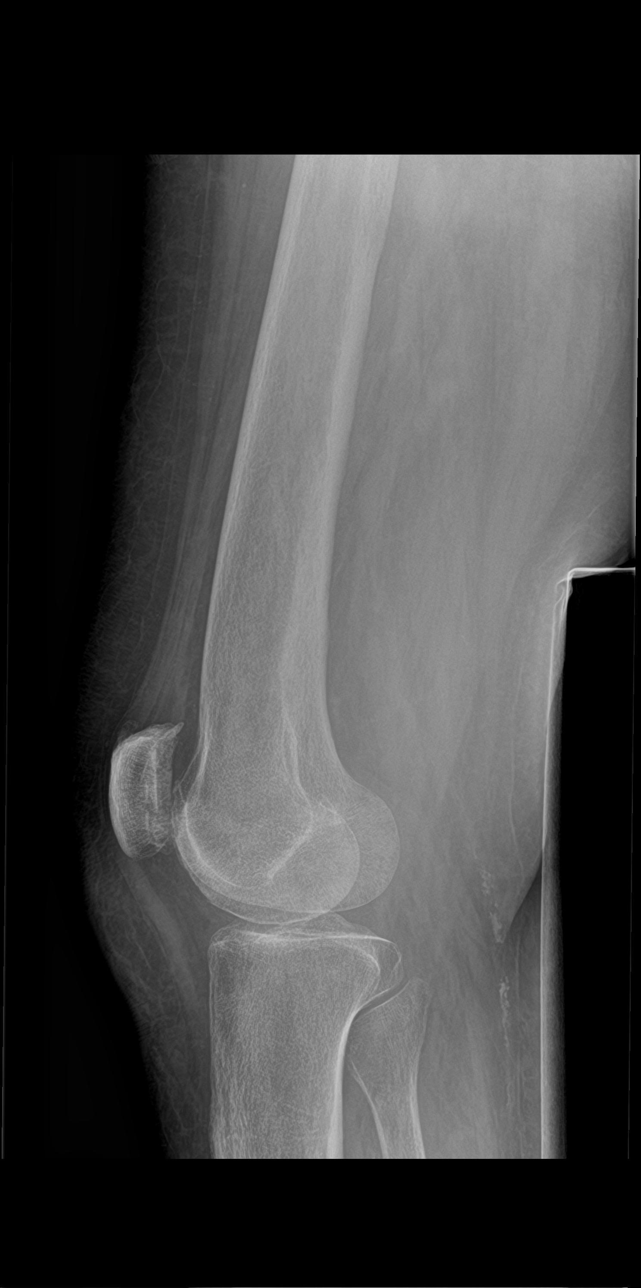

[4 of 4 positions shown; findings below may reference images not displayed]

FINDINGS: Left total hip arthroplasty appears intact in its expected alignment
without periprosthetic lucency or fracture. Femur intact. Mild
tricompartmental degenerative changes of the left knee without
evidence of fracture. No knee joint effusion. Mild prepatellar soft
tissue swelling.

Dedicated views of the left tibia and fibula reveal no acute
fracture or malalignment. Soft tissues within normal limits.
IMPRESSION: 1. No acute fracture or malalignment of the imaged left lower
extremity.
2. Mild prepatellar soft tissue swelling.
3. Mild tricompartmental degenerative changes of the left knee.
